# Patient Record
Sex: Male | Born: 1990 | Race: White | Hispanic: No | Smoking: Current every day smoker
Health system: Southern US, Community
[De-identification: ages and names within clinical notes are randomized; demographics above are authoritative.]

## PROBLEM LIST (undated history)

## (undated) ENCOUNTER — Ambulatory Visit: Admission: EM | Payer: MEDICAID

## (undated) DIAGNOSIS — F419 Anxiety disorder, unspecified: Secondary | ICD-10-CM

## (undated) DIAGNOSIS — G629 Polyneuropathy, unspecified: Secondary | ICD-10-CM

## (undated) DIAGNOSIS — F3181 Bipolar II disorder: Secondary | ICD-10-CM

## (undated) DIAGNOSIS — F840 Autistic disorder: Secondary | ICD-10-CM

## (undated) DIAGNOSIS — F909 Attention-deficit hyperactivity disorder, unspecified type: Secondary | ICD-10-CM

## (undated) DIAGNOSIS — F209 Schizophrenia, unspecified: Secondary | ICD-10-CM

## (undated) DIAGNOSIS — G4730 Sleep apnea, unspecified: Secondary | ICD-10-CM

## (undated) DIAGNOSIS — I10 Essential (primary) hypertension: Secondary | ICD-10-CM

## (undated) DIAGNOSIS — R569 Unspecified convulsions: Secondary | ICD-10-CM

## (undated) DIAGNOSIS — F329 Major depressive disorder, single episode, unspecified: Secondary | ICD-10-CM

## (undated) DIAGNOSIS — K219 Gastro-esophageal reflux disease without esophagitis: Secondary | ICD-10-CM

## (undated) DIAGNOSIS — E785 Hyperlipidemia, unspecified: Secondary | ICD-10-CM

## (undated) DIAGNOSIS — F32A Depression, unspecified: Secondary | ICD-10-CM

## (undated) DIAGNOSIS — F1010 Alcohol abuse, uncomplicated: Secondary | ICD-10-CM

## (undated) HISTORY — DX: Attention-deficit hyperactivity disorder, unspecified type: F90.9

## (undated) HISTORY — DX: Depression, unspecified: F32.A

## (undated) HISTORY — DX: Schizophrenia, unspecified: F20.9

## (undated) HISTORY — PX: HAND SURGERY: SHX662

## (undated) HISTORY — DX: Polyneuropathy, unspecified: G62.9

## (undated) HISTORY — PX: WISDOM TOOTH EXTRACTION: SHX21

## (undated) HISTORY — DX: Major depressive disorder, single episode, unspecified: F32.9

## (undated) HISTORY — DX: Essential (primary) hypertension: I10

## (undated) HISTORY — DX: Gastro-esophageal reflux disease without esophagitis: K21.9

## (undated) HISTORY — DX: Sleep apnea, unspecified: G47.30

## (undated) HISTORY — DX: Hyperlipidemia, unspecified: E78.5

## (undated) HISTORY — PX: TYMPANOSTOMY TUBE PLACEMENT: SHX32

## (undated) HISTORY — DX: Autistic disorder: F84.0

## (undated) HISTORY — DX: Anxiety disorder, unspecified: F41.9

---
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
## 2016-04-17 DIAGNOSIS — R6882 Decreased libido: Secondary | ICD-10-CM | POA: Insufficient documentation

## 2018-12-08 ENCOUNTER — Telehealth (HOSPITAL_COMMUNITY): Payer: Self-pay | Admitting: Psychiatry

## 2019-02-09 ENCOUNTER — Encounter (HOSPITAL_COMMUNITY): Payer: Self-pay | Admitting: Psychiatry

## 2019-02-09 ENCOUNTER — Ambulatory Visit (INDEPENDENT_AMBULATORY_CARE_PROVIDER_SITE_OTHER): Payer: 59 | Admitting: Psychiatry

## 2019-02-09 VITALS — BP 120/80 | Ht 69.0 in | Wt 182.0 lb

## 2019-02-09 DIAGNOSIS — F411 Generalized anxiety disorder: Secondary | ICD-10-CM

## 2019-02-09 DIAGNOSIS — F1020 Alcohol dependence, uncomplicated: Secondary | ICD-10-CM

## 2019-02-09 DIAGNOSIS — F331 Major depressive disorder, recurrent, moderate: Secondary | ICD-10-CM | POA: Diagnosis not present

## 2019-02-09 MED ORDER — GABAPENTIN 300 MG PO CAPS: 300.0000 mg | ORAL_CAPSULE | Freq: Three times a day (TID) | ORAL | 0 refills | Status: DC

## 2019-02-09 MED ORDER — BUSPIRONE HCL 15 MG PO TABS: 15.0000 mg | ORAL_TABLET | Freq: Two times a day (BID) | ORAL | 0 refills | Status: DC

## 2019-02-09 MED ORDER — MIRTAZAPINE 15 MG PO TABS: 15.0000 mg | ORAL_TABLET | Freq: Every day | ORAL | 0 refills | Status: DC

## 2019-02-09 NOTE — Patient Instructions (Signed)
Plan:  1. Trial of mirtazapine (Remeron) for depression and anxiety. Start by taking 15 mg tablet at bedtime.  2. We will increase dose of buspirone to 15 mg twice daily.  3. We will try gabapentin for anxiety/tremor (it may help you in limiting or quitting alcohol use). Take one 300 mg tablet in the morning and two tablets at bedtime.
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
## 2019-02-24 ENCOUNTER — Other Ambulatory Visit: Payer: Self-pay

## 2019-02-24 ENCOUNTER — Encounter (HOSPITAL_COMMUNITY): Payer: Self-pay

## 2019-02-24 ENCOUNTER — Emergency Department (HOSPITAL_COMMUNITY): Payer: 59

## 2019-02-24 ENCOUNTER — Emergency Department (HOSPITAL_COMMUNITY)
Admission: EM | Admit: 2019-02-24 | Discharge: 2019-02-24 | Disposition: A | Discharge status: Home / Self Care | Payer: 59 | Attending: Emergency Medicine | Admitting: Emergency Medicine

## 2019-02-24 DIAGNOSIS — Y939 Activity, unspecified: Secondary | ICD-10-CM | POA: Diagnosis not present

## 2019-02-24 DIAGNOSIS — S61011A Laceration without foreign body of right thumb without damage to nail, initial encounter: Secondary | ICD-10-CM | POA: Diagnosis not present

## 2019-02-24 DIAGNOSIS — Z23 Encounter for immunization: Secondary | ICD-10-CM | POA: Diagnosis not present

## 2019-02-24 DIAGNOSIS — Z79899 Other long term (current) drug therapy: Secondary | ICD-10-CM | POA: Insufficient documentation

## 2019-02-24 DIAGNOSIS — W268XXA Contact with other sharp object(s), not elsewhere classified, initial encounter: Secondary | ICD-10-CM | POA: Diagnosis not present

## 2019-02-24 DIAGNOSIS — F17210 Nicotine dependence, cigarettes, uncomplicated: Secondary | ICD-10-CM | POA: Diagnosis not present

## 2019-02-24 DIAGNOSIS — Y929 Unspecified place or not applicable: Secondary | ICD-10-CM | POA: Insufficient documentation

## 2019-02-24 DIAGNOSIS — S6991XA Unspecified injury of right wrist, hand and finger(s), initial encounter: Secondary | ICD-10-CM | POA: Diagnosis present

## 2019-02-24 DIAGNOSIS — Y999 Unspecified external cause status: Secondary | ICD-10-CM | POA: Diagnosis not present

## 2019-02-24 MED ORDER — BUPIVACAINE HCL (PF) 0.5 % IJ SOLN
10.0000 mL | Freq: Once | INTRAMUSCULAR | Status: AC
  Administered 2019-02-24: 10 mL
  Filled 2019-02-24: qty 10

## 2019-02-24 MED ORDER — TETANUS-DIPHTH-ACELL PERTUSSIS 5-2.5-18.5 LF-MCG/0.5 IM SUSP
0.5000 mL | Freq: Once | INTRAMUSCULAR | Status: AC
  Administered 2019-02-24: 0.5 mL via INTRAMUSCULAR
  Filled 2019-02-24: qty 0.5

## 2019-02-24 MED ORDER — BACITRACIN-NEOMYCIN-POLYMYXIN OINTMENT TUBE
TOPICAL_OINTMENT | Freq: Once | CUTANEOUS | Status: AC
  Administered 2019-02-24: 1 via TOPICAL
  Filled 2019-02-24: qty 14

## 2019-02-24 NOTE — ED Notes (Signed)
Patient verbalizes understanding of discharge instructions. Opportunity for questioning and answers were provided. Armband removed by staff, pt discharged from ED ambulatory.   

## 2019-02-24 NOTE — ED Provider Notes (Signed)
[facility] EMERGENCY DEPARTMENT Provider Note   CSN: [id number] Arrival date & time: 02/24/19  2117    History   Chief Complaint Chief Complaint  Patient presents with  . Finger Injury    HPI John Vaughan is a 28 y.o. male.     Pt presents to the ED today with a right thumb laceration.  The pt said he cut it on a muddy fan blade.  He has no idea when he had his last tetanus shot.  Pt denies any another injuries.     Past Medical History:  Diagnosis Date  . Anxiety   . Depression     There are no active problems to display for this patient.   History reviewed. No pertinent surgical history.      Home Medications    Prior to Admission medications   Medication Sig Start Date End Date Taking? Authorizing Provider  busPIRone (BUSPAR) 15 MG tablet Take 1 tablet (15 mg total) by mouth 2 (two) times daily for 30 days. 02/09/19 03/11/19  Pucilowski, Roosvelt Maser, MD  gabapentin (NEURONTIN) 300 MG capsule Take 1 capsule (300 mg total) by mouth 3 (three) times daily for 30 days. 02/09/19 03/11/19  Pucilowski, Roosvelt Maser, MD  mirtazapine (REMERON) 15 MG tablet Take 1 tablet (15 mg total) by mouth at bedtime for 30 days. 02/09/19 03/11/19  Pucilowski, Roosvelt Maser, MD    Family History Family History  Problem Relation Age of Onset  . Anxiety disorder Mother   . Alcohol abuse Father   . Anxiety disorder Maternal Aunt   . Anxiety disorder Maternal Grandmother   . Alcohol abuse Paternal Grandfather     Social History Social History   Tobacco Use  . Smoking status: Current Every Day Smoker    Packs/day: 1.00    Types: Cigarettes  . Smokeless tobacco: Current User    Types: Snuff  Substance Use Topics  . Alcohol use: Yes    Alcohol/week: 18.0 standard drinks    Types: 18 Cans of beer per week    Comment: Patient has interest in stopping  . Drug use: Not Currently     Allergies   Patient has no known allergies.   Review of Systems Review of  Systems  Skin: Positive for wound.  All other systems reviewed and are negative.    Physical Exam Updated Vital Signs BP 112/71   Pulse 93   Temp 98 F (36.7 C) (Oral)   Resp 20   SpO2 97%   Physical Exam Vitals signs and nursing note reviewed.  Constitutional:      Appearance: Normal appearance.  HENT:     Head: Normocephalic and atraumatic.     Right Ear: External ear normal.     Left Ear: External ear normal.     Nose: Nose normal.     Mouth/Throat:     Mouth: Mucous membranes are dry.  Eyes:     Pupils: Pupils are equal, round, and reactive to light.  Neck:     Musculoskeletal: Normal range of motion and neck supple.  Cardiovascular:     Rate and Rhythm: Normal rate and regular rhythm.     Pulses: Normal pulses.     Heart sounds: Normal heart sounds.  Pulmonary:     Effort: Pulmonary effort is normal.     Breath sounds: Normal breath sounds.  Abdominal:     General: Abdomen is flat.     Palpations: Abdomen is soft.  Musculoskeletal: Normal  range of motion.  Skin:    General: Skin is warm.     Capillary Refill: Capillary refill takes less than 2 seconds.     Comments: Lac to right IP joint.    Neurological:     General: No focal deficit present.     Mental Status: He is alert and oriented to person, place, and time.  Psychiatric:        Mood and Affect: Mood normal.        Behavior: Behavior normal.      ED Treatments / Results  Labs (all labs ordered are listed, but only abnormal results are displayed) Labs Reviewed - No data to display  EKG None  Radiology Dg Finger Thumb Right  Result Date: 02/24/2019 CLINICAL DATA:  Thumb injury, laceration EXAM: RIGHT THUMB 2+V COMPARISON:  None. FINDINGS: No fracture or malalignment.  No radiopaque foreign body. IMPRESSION: No acute osseous abnormality Electronically Signed   By: Jasmine Pang M.D.   On: 02/24/2019 22:34    Procedures .Marland KitchenLaceration Repair Date/Time: 02/24/2019 10:54 PM Performed by:  Jacalyn Lefevre, MD Authorized by: Jacalyn Lefevre, MD   Consent:    Consent obtained:  Verbal   Consent given by:  Patient   Risks discussed:  Infection and pain   Alternatives discussed:  No treatment Anesthesia (see MAR for exact dosages):    Anesthesia method:  Nerve block   Block needle gauge:  27 G   Block anesthetic:  Bupivacaine 0.5% w/o epi   Block technique:  Digital   Block injection procedure:  Anatomic landmarks identified, introduced needle, incremental injection and anatomic landmarks palpated   Block outcome:  Anesthesia achieved Laceration details:    Location:  Finger   Finger location:  R thumb   Length (cm):  1 Repair type:    Repair type:  Simple Pre-procedure details:    Preparation:  Patient was prepped and draped in usual sterile fashion Exploration:    Wound exploration: wound explored through full range of motion   Treatment:    Area cleansed with:  Saline   Amount of cleaning:  Extensive   Irrigation solution:  Sterile saline   Irrigation method:  Pressure wash Skin repair:    Repair method:  Sutures   Suture size:  3-0   Suture material:  Prolene   Suture technique:  Simple interrupted   Number of sutures:  2 Approximation:    Approximation:  Close Post-procedure details:    Dressing:  Antibiotic ointment   Patient tolerance of procedure:  Tolerated well, no immediate complications   (including critical care time)  Medications Ordered in ED Medications  Tdap (BOOSTRIX) injection 0.5 mL (has no administration in time range)  neomycin-bacitracin-polymyxin (NEOSPORIN) ointment packet (has no administration in time range)  bupivacaine (MARCAINE) 0.5 % injection 10 mL (10 mLs Infiltration Given by Other 02/24/19 2243)     Initial Impression / Assessment and Plan / ED Course  I have reviewed the triage vital signs and the nursing notes.  Pertinent labs & imaging results that were available during my care of the patient were reviewed by me and  considered in my medical decision making (see chart for details).       Pt was given a tetanus in ED.  He is stable for d/c.  Return for increased redness/swelling.  Final Clinical Impressions(s) / ED Diagnoses   Final diagnoses:  Laceration of right thumb without foreign body without damage to nail, initial encounter    ED  Discharge Orders    None       Jacalyn Lefevre, MD 02/24/19 2307

## 2019-02-24 NOTE — ED Notes (Signed)
Pt will come to room after xray

## 2019-02-24 NOTE — ED Triage Notes (Signed)
Pt states that he was working on his trunk today and cut his R thumb, bleeding controlled, last tetanus unknown.

## 2019-02-24 NOTE — ED Notes (Signed)
ED Provider at bedside. 

## 2019-03-09 ENCOUNTER — Ambulatory Visit (INDEPENDENT_AMBULATORY_CARE_PROVIDER_SITE_OTHER): Payer: 59 | Admitting: Psychiatry

## 2019-03-09 ENCOUNTER — Encounter (HOSPITAL_COMMUNITY): Payer: Self-pay | Admitting: Psychiatry

## 2019-03-09 ENCOUNTER — Other Ambulatory Visit: Payer: Self-pay

## 2019-03-09 VITALS — BP 162/70 | HR 93 | Temp 98.7°F | Ht 72.0 in | Wt 185.0 lb

## 2019-03-09 DIAGNOSIS — F411 Generalized anxiety disorder: Secondary | ICD-10-CM | POA: Diagnosis not present

## 2019-03-09 DIAGNOSIS — F1020 Alcohol dependence, uncomplicated: Secondary | ICD-10-CM | POA: Diagnosis not present

## 2019-03-09 DIAGNOSIS — F330 Major depressive disorder, recurrent, mild: Secondary | ICD-10-CM | POA: Insufficient documentation

## 2019-03-09 MED ORDER — BUSPIRONE HCL 15 MG PO TABS: 15.0000 mg | ORAL_TABLET | Freq: Two times a day (BID) | ORAL | 0 refills | Status: AC

## 2019-03-09 MED ORDER — PROPRANOLOL HCL 10 MG PO TABS: 10.0000 mg | ORAL_TABLET | Freq: Three times a day (TID) | ORAL | 0 refills | Status: DC

## 2019-03-09 MED ORDER — MIRTAZAPINE 30 MG PO TABS: 30.0000 mg | ORAL_TABLET | Freq: Every day | ORAL | 0 refills | Status: DC

## 2019-03-09 NOTE — Progress Notes (Signed)
[facility] MD/PA/NP OP Progress Note  03/09/2019 2:29 PM John Vaughan  MRN:  [id number]  Chief Complaint: Anxious, restless HPI: 28 yo male with alcohol use disorder, major depression and mixed anxiety. He has been tried on two SSRIs (Prozac and Zoloft) but at a low dose and very briefly. He is still visibly tremulous. He did not join AA despite suggestion to do so. He is passively suicidal but he contracts for safety and there is no hx of suicidal attempts. He has been able to decrease amount of alcohol he consumes to 8-10 beers in the evening. He is disappointed that gabapentin did not appear to have helped with restlessness. On the other hand his depression is resolved on Remeron and anxiety is less. His sleep and appetite are good. There is no SI.  Visit Diagnosis:    ICD-10-CM   1. GAD (generalized anxiety disorder) F41.1   2. Alcohol use disorder, severe, dependence (HCC) F10.20   3. Mild episode of recurrent major depressive disorder (HCC) F33.0     Past Psychiatric History: Please see initial H&P.  Past Medical History:  Past Medical History:  Diagnosis Date  . Anxiety   . Depression    History reviewed. No pertinent surgical history.  Family Psychiatric History: reviewed  Family History:  Family History  Problem Relation Age of Onset  . Anxiety disorder Mother   . Alcohol abuse Father   . Anxiety disorder Maternal Aunt   . Anxiety disorder Maternal Grandmother   . Alcohol abuse Paternal Grandfather     Social History:  Social History   Socioeconomic History  . Marital status: Single    Spouse name: Not on file  . Number of children: Not on file  . Years of education: Not on file  . Highest education level: Not on file  Occupational History  . Not on file  Social Needs  . Financial resource strain: Not on file  . Food insecurity:    Worry: Not on file    Inability: Not on file  . Transportation needs:    Medical: Not on file    Non-medical: Not on file   Tobacco Use  . Smoking status: Current Every Day Smoker    Packs/day: 1.00    Types: Cigarettes  . Smokeless tobacco: Former Neurosurgeon    Types: Snuff  Substance and Sexual Activity  . Alcohol use: Yes    Alcohol/week: 18.0 standard drinks    Types: 18 Cans of beer per week    Comment: Patient has interest in stopping  . Drug use: Not Currently  . Sexual activity: Not Currently  Lifestyle  . Physical activity:    Days per week: Not on file    Minutes per session: Not on file  . Stress: Not on file  Relationships  . Social connections:    Talks on phone: Not on file    Gets together: Not on file    Attends religious service: Not on file    Active member of club or organization: Not on file    Attends meetings of clubs or organizations: Not on file    Relationship status: Not on file  Other Topics Concern  . Not on file  Social History Narrative  . Not on file    Allergies: No Known Allergies  Metabolic Disorder Labs: No results found for: HGBA1C, MPG No results found for: PROLACTIN No results found for: CHOL, TRIG, HDL, CHOLHDL, VLDL, LDLCALC No results found for: TSH  Therapeutic Level  Labs: No results found for: LITHIUM No results found for: VALPROATE No components found for:  CBMZ  Current Medications: Current Outpatient Medications  Medication Sig Dispense Refill  . busPIRone (BUSPAR) 15 MG tablet Take 1 tablet (15 mg total) by mouth 2 (two) times daily for 30 days. 60 tablet 0  . mirtazapine (REMERON) 30 MG tablet Take 1 tablet (30 mg total) by mouth at bedtime for 30 days. 30 tablet 0  . propranolol (INDERAL) 10 MG tablet Take 1 tablet (10 mg total) by mouth 3 (three) times daily for 30 days. 90 tablet 0   No current facility-administered medications for this visit.      Musculoskeletal: Strength & Muscle Tone: within normal limits Gait & Station: normal Patient leans: N/A  Psychiatric Specialty Exam: Review of Systems  Constitutional: Negative.    HENT: Negative.   Eyes: Negative.   Respiratory: Negative.   Cardiovascular: Negative.   Gastrointestinal: Negative.   Genitourinary: Negative.   Musculoskeletal: Negative.   Skin: Negative.   Neurological: Negative.   Endo/Heme/Allergies: Negative.   Psychiatric/Behavioral: The patient is nervous/anxious.     Blood pressure (!) 162/70, pulse 93, temperature 98.7 F (37.1 C), height 6' (1.829 m), weight 185 lb (83.9 kg).Body mass index is 25.09 kg/m.  General Appearance: Casual  Eye Contact:  Good  Speech:  Clear and Coherent  Volume:  Normal  Mood:  Anxious  Affect:  Full Range  Thought Process:  Goal Directed  Orientation:  Full (Time, Place, and Person)  Thought Content: Logical   Suicidal Thoughts:  No  Homicidal Thoughts:  No  Memory:  Immediate;   Good Recent;   Good Remote;   Good  Judgement:  Fair  Insight:  Fair  Psychomotor Activity:  Restlessness  Concentration:  Concentration: Good  Recall:  Good  Fund of Knowledge: Good  Language: Good  Akathisia:  unchenged from past visit.  Handed:  Right  AIMS (if indicated): not done  Assets:  Communication Skills Desire for Improvement Housing Physical Health  ADL's:  Intact  Cognition: WNL  Sleep:  Good   Screenings: AUDIT     Office Visit from 02/09/2019 in [facility]  Alcohol Use Disorder Identification Test Final Score (AUDIT)  29    GAD-7     Office Visit from 02/09/2019 in [facility]  Total GAD-7 Score  20    PHQ2-9     Office Visit from 02/09/2019 in [facility]  PHQ-2 Total Score  5  PHQ-9 Total Score  20       Assessment and Plan: 28 yo male with alcohol use disorder, major depression and mixed anxiety. He has been tried on two SSRIs (Prozac and Zoloft) but at a low dose and very briefly. He is still visibly tremulous. He did not join AA despite suggestion to do so. He is  passively suicidal but he contracts for safety and there is no hx of suicidal attempts. He has been able to decrease amount of alcohol he consumes to 8-10 beers in the evening. He is disappointed that gabapentin did not appear to have helped with restlessness. On the other hand his depression is resolved on Remeron and anxiety is less. His sleep and appetite are good. There is no SI.  Plan: Taper gabapentin off. Increase Remeron to 30 mg at HS, continue Buspar 15 mg bid. Add propranolol 10 mg tid for restlessness/anxiety. Return to clinic in 4 weeks.  Magdalene Patricia, MD 03/09/2019, 2:29 PM

## 2019-03-26 ENCOUNTER — Emergency Department (HOSPITAL_COMMUNITY)
Admission: EM | Admit: 2019-03-26 | Discharge: 2019-03-26 | Disposition: A | Discharge status: Home / Self Care | Payer: 59 | Attending: Emergency Medicine | Admitting: Emergency Medicine

## 2019-03-26 ENCOUNTER — Encounter (HOSPITAL_COMMUNITY): Payer: Self-pay | Admitting: Emergency Medicine

## 2019-03-26 ENCOUNTER — Other Ambulatory Visit: Payer: Self-pay

## 2019-03-26 DIAGNOSIS — F17210 Nicotine dependence, cigarettes, uncomplicated: Secondary | ICD-10-CM | POA: Diagnosis not present

## 2019-03-26 DIAGNOSIS — Z79899 Other long term (current) drug therapy: Secondary | ICD-10-CM | POA: Insufficient documentation

## 2019-03-26 DIAGNOSIS — F1010 Alcohol abuse, uncomplicated: Secondary | ICD-10-CM

## 2019-03-26 DIAGNOSIS — F10239 Alcohol dependence with withdrawal, unspecified: Secondary | ICD-10-CM | POA: Diagnosis present

## 2019-03-26 MED ORDER — CHLORDIAZEPOXIDE HCL 25 MG PO CAPS: ORAL_CAPSULE | ORAL | 0 refills | Status: DC

## 2019-03-26 NOTE — ED Provider Notes (Signed)
[facility]-EMERGENCY DEPT Provider Note   CSN: [id number] Arrival date & time: 03/26/19  1816    History   Chief Complaint Chief Complaint  Patient presents with  . Withdrawal    HPI John Vaughan is a 28 y.o. male.     HPI   Patient is a 28 year old male with a history of anxiety and depression, who presents to the emergency department today with request for alcohol detox.  Patient states that he has drank heavily for the past 11 years.  He currently drinks about 18 peers per day.  He denies any suicidal or homicidal ideations.  States that he has had hallucinations in the past but not for very long time.  Has also had some auditory hallucinations when he has heard things that others have not heard in the room.  Denies hearing voices or having command hallucinations.  He has told his physician about these hallucinations and was told that his medication would improve them, which she states is true.  He does admit that he stopped taking his depression medication about 3 weeks ago because he states he would rather drink and take this medication.  Denies any significant cough, congestion, rhinorrhea, sore throat, chest pain, shortness of breath, abdominal pain.  He does not endorse frequent vomiting from his alcohol use.  Does endorse chronic diarrhea from his beer drinking.  Denies fevers.  Past Medical History:  Diagnosis Date  . Anxiety   . Depression     Patient Active Problem List   Diagnosis Date Noted  . GAD (generalized anxiety disorder) 03/09/2019  . Alcohol use disorder, severe, dependence (HCC) 03/09/2019  . 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 No pertinent surgical history.      Home Medications    Prior to Admission medications   Medication Sig Start Date End Date Taking? Authorizing Provider  busPIRone (BUSPAR) 15 MG tablet Take 1 tablet (15 mg total) by mouth 2 (two) times daily  for 30 days. 03/09/19 04/08/19  Pucilowski, Roosvelt Maser, MD  chlordiazePOXIDE (LIBRIUM) 25 MG capsule 50mg  PO TID x 1D, then 25-50mg  PO BID X 1D, then 25-50mg  PO QD X 1D 03/26/19   Arriana Lohmann S, PA-C  mirtazapine (REMERON) 30 MG tablet Take 1 tablet (30 mg total) by mouth at bedtime for 30 days. 03/09/19 04/08/19  Pucilowski, Roosvelt Maser, MD  propranolol (INDERAL) 10 MG tablet Take 1 tablet (10 mg total) by mouth 3 (three) times daily for 30 days. 03/09/19 04/08/19  Pucilowski, Roosvelt Maser, MD    Family History Family History  Problem Relation Age of Onset  . Anxiety disorder Mother   . Alcohol abuse Father   . Anxiety disorder Maternal Aunt   . Anxiety disorder Maternal Grandmother   . Alcohol abuse Paternal Grandfather     Social History Social History   Tobacco Use  . Smoking status: Current Every Day Smoker    Packs/day: 1.00    Types: Cigarettes  . Smokeless tobacco: Former Neurosurgeon    Types: Snuff  Substance Use Topics  . Alcohol use: Yes    Alcohol/week: 18.0 standard drinks    Types: 18 Cans of beer per week    Comment: Patient has interest in stopping  . Drug use: Not Currently     Allergies   Patient has no known allergies.   Review of Systems Review of Systems  Constitutional: Negative for fever.  HENT: Negative for ear pain and  sore throat.   Eyes: Negative for visual disturbance.  Respiratory: Negative for cough and shortness of breath.   Cardiovascular: Negative for chest pain.  Gastrointestinal: Positive for diarrhea, nausea and vomiting. Negative for abdominal pain and constipation.  Genitourinary: Negative for dysuria and hematuria.  Musculoskeletal: Negative for back pain.  Skin: Negative for rash.  Neurological: Negative for headaches.  Psychiatric/Behavioral:       Etoh abuse, no current si, hi, or avh  All other systems reviewed and are negative.    Physical Exam Updated Vital Signs BP (!) 142/100 (BP Location: Left Arm)   Pulse (!) 103   Temp 99.1  F (37.3 C) (Oral)   Resp 16   SpO2 99%   Physical Exam Vitals signs and nursing note reviewed.  Constitutional:      General: He is not in acute distress.    Appearance: He is well-developed. He is not ill-appearing, toxic-appearing or diaphoretic.  HENT:     Head: Normocephalic and atraumatic.  Eyes:     Conjunctiva/sclera: Conjunctivae normal.  Neck:     Musculoskeletal: Neck supple.  Cardiovascular:     Rate and Rhythm: Regular rhythm. Tachycardia present.     Heart sounds: Normal heart sounds. No murmur.     Comments: Marginally tachycardic Pulmonary:     Effort: Pulmonary effort is normal. No respiratory distress.     Breath sounds: Normal breath sounds. No wheezing, rhonchi or rales.  Abdominal:     General: Bowel sounds are normal.     Palpations: Abdomen is soft.     Tenderness: There is no abdominal tenderness.  Skin:    General: Skin is warm and dry.  Neurological:     Mental Status: He is alert.  Psychiatric:        Behavior: Behavior normal.        Thought Content: Thought content normal.        Judgment: Judgment normal.     Comments: Appears anxious, tapping feet quickly throughout exam      ED Treatments / Results  Labs (all labs ordered are listed, but only abnormal results are displayed) Labs Reviewed - No data to display  EKG None  Radiology No results found.  Procedures Procedures (including critical care time)  Medications Ordered in ED Medications - No data to display   Initial Impression / Assessment and Plan / ED Course  I have reviewed the triage vital signs and the nursing notes.  Pertinent labs & imaging results that were available during my care of the patient were reviewed by me and considered in my medical decision making (see chart for details).     Final Clinical Impressions(s) / ED Diagnoses   Final diagnoses:  ETOH abuse   Pt presenting requesting ETOH detox. He last drank etoh this am. No illicit substance use.   No SI or HI.  No significant concerns for auditory or visual hallucinations.  He has no medical complaints.  He denies a history of complicated withdrawal symptoms.  States he has never stopped drinking alcohol. No significant current withdrawal sxs. last drink earlier today. CIWA score is 4. Pt not in acute withdrawal.   He recently stopped taking his depression medications.  He does follow with the physician who monitors him for his depression/anxiety.  I consulted peer support.  I will give him Librium taper.  Advised not to drink alcohol while taking this medication.  I advised him to contact his physician tomorrow morning to inform him that  he is not taking his depression medications and that he has started Librium and would like to stop drinking alcohol.  Gave him resources for detox programs.  Advised on specific return precautions.  He voices understanding of the plan and reasons to return.  All questions answered.  Patient stable discharge.   ED Discharge Orders         Ordered    chlordiazePOXIDE (LIBRIUM) 25 MG capsule     03/26/19 1906           Rayne Du 03/26/19 Geryl Councilman, MD 03/30/19 [phone]

## 2019-03-26 NOTE — Discharge Instructions (Signed)
Please take the medication as prescribed.  Do not drink alcohol while you are taking this medication.  A peer support contact will reach out to you soon.   Please follow-up with the resources that were prior to provided for you in the emergency department today.  Please call your doctor tomorrow to inform them that you have stopped taking your depression medications.  Also inform them that you started Librium and would like to stop drinking alcohol.  Return to the emergency department for any new or worsening symptoms including suicidal thoughts, thoughts of wanting to hurting others, seizures.

## 2019-03-26 NOTE — ED Notes (Signed)
Bed: [facility]3 Expected date:  Expected time:  Means of arrival:  Comments: 

## 2019-03-26 NOTE — ED Triage Notes (Signed)
Patient presents requesting alcohol detox. States he drinks approximately eighteen beers a day. Reports last drink x25 minutes ago. Denies N/V/D.

## 2019-04-09 ENCOUNTER — Other Ambulatory Visit: Payer: Self-pay

## 2019-04-09 ENCOUNTER — Ambulatory Visit (INDEPENDENT_AMBULATORY_CARE_PROVIDER_SITE_OTHER): Payer: 59 | Admitting: Psychiatry

## 2019-04-09 DIAGNOSIS — F330 Major depressive disorder, recurrent, mild: Secondary | ICD-10-CM | POA: Diagnosis not present

## 2019-04-09 DIAGNOSIS — F411 Generalized anxiety disorder: Secondary | ICD-10-CM

## 2019-04-09 DIAGNOSIS — Z79899 Other long term (current) drug therapy: Secondary | ICD-10-CM | POA: Diagnosis not present

## 2019-04-09 DIAGNOSIS — F1020 Alcohol dependence, uncomplicated: Secondary | ICD-10-CM

## 2019-04-09 MED ORDER — GABAPENTIN 600 MG PO TABS: 600.0000 mg | ORAL_TABLET | Freq: Three times a day (TID) | ORAL | 2 refills | Status: DC

## 2019-04-09 NOTE — Progress Notes (Signed)
BH MD/PA/NP OP Progress Note  04/09/2019 2:22 PM John Vaughan  MRN:  [id number] Interview was conducted using teleconferencing and I verified that I was speaking with the correct person using two identifiers. I discussed the limitations of evaluation and management by telemedicine and  the availability of in person appointments. Patient expressed understanding and agreed to proceed.  Chief Complaint: Tremor, anxiety.  HPI: 28 yo male with alcohol use disorder, major depression and mixed anxiety. He has been tried on two SSRIs (Prozac and Zoloft) but at a low dose and very briefly. He did not join AA despite suggestion to do so. He admitted to past passive suicidal thoughts and there is no hx of suicidal attempts. He was prescribed Remeron for depression with dose titrated to 30 mg but he stopped taking it and insted increased amount of alcohol consumed up to 18 beers daily. He continued to be visibly treomolous during both his office visits. In early April he started to experience nausea, vomiting and auditory hallucinations. He was seen in ED given Librium taper for alcohol withdrawal symptoms but eventually ended up in [facility] with sx of alcohol withdrawal delirium. He spend a week there and was discharged on gabapentin 300 mg qid which decreased his tremor somewhat. He is also supposed to pick another prescription for anxiety but does not remember the name of it and I have no information/access to record from his recent admission. He denies being very depressed now, denies being suicidal but still has anxiety. He has remained sober since discharge from hospital. In the past he failed trials of buspirone and propranolol for anxiety.  Visit Diagnosis:    ICD-10-CM   1. GAD (generalized anxiety disorder) F41.1   2. Alcohol use disorder, severe, dependence (HCC) F10.20   3. Mild episode of recurrent major depressive disorder (HCC) F33.0     Past Psychiatric History:  Please refer to intake H&P.  Past Medical History:  Past Medical History:  Diagnosis Date  . Anxiety   . Depression    No past surgical history on file.  Family Psychiatric History: Reviewed.  Family History:  Family History  Problem Relation Age of Onset  . Anxiety disorder Mother   . Alcohol abuse Father   . Anxiety disorder Maternal Aunt   . Anxiety disorder Maternal Grandmother   . Alcohol abuse Paternal Grandfather     Social History:  Social History   Socioeconomic History  . Marital status: Single    Spouse name: Not on file  . Number of children: Not on file  . Years of education: Not on file  . Highest education level: Not on file  Occupational History  . Not on file  Social Needs  . Financial resource strain: Not on file  . Food insecurity:    Worry: Not on file    Inability: Not on file  . Transportation needs:    Medical: Not on file    Non-medical: Not on file  Tobacco Use  . Smoking status: Current Every Day Smoker    Packs/day: 1.00    Types: Cigarettes  . Smokeless tobacco: Former Neurosurgeon    Types: Snuff  Substance and Sexual Activity  . Alcohol use: Yes    Alcohol/week: 18.0 standard drinks    Types: 18 Cans of beer per week    Comment: Patient has interest in stopping  . Drug use: Not Currently  . Sexual activity: Not Currently  Lifestyle  . Physical activity:  Days per week: Not on file    Minutes per session: Not on file  . Stress: Not on file  Relationships  . Social connections:    Talks on phone: Not on file    Gets together: Not on file    Attends religious service: Not on file    Active member of club or organization: Not on file    Attends meetings of clubs or organizations: Not on file    Relationship status: Not on file  Other Topics Concern  . Not on file  Social History Narrative  . Not on file    Allergies: No Known Allergies  Metabolic Disorder Labs: No results found for: HGBA1C, MPG No results found for:  PROLACTIN No results found for: CHOL, TRIG, HDL, CHOLHDL, VLDL, LDLCALC No results found for: TSH  Therapeutic Level Labs: No results found for: LITHIUM No results found for: VALPROATE No components found for:  CBMZ  Current Medications: Current Outpatient Medications  Medication Sig Dispense Refill  . chlordiazePOXIDE (LIBRIUM) 25 MG capsule  PO TID x 1D, then 25-50mg  PO BID X 1D, then 25-50mg  PO QD X 1D 10 capsule 0  . mirtazapine (REMERON) 30 MG tablet Take 1 tablet (30 mg total) by mouth at bedtime for 30 days. 30 tablet 0  . propranolol (INDERAL) 10 MG tablet Take 1 tablet (10 mg total) by mouth 3 (three) times daily for 30 days. 90 tablet 0   No current facility-administered medications for this visit.      Psychiatric Specialty Exam: Review of Systems  Neurological: Positive for tremors.  Psychiatric/Behavioral: The patient is nervous/anxious.   All other systems reviewed and are negative.   There were no vitals taken for this visit.There is no height or weight on file to calculate BMI.  General Appearance: NA  Eye Contact:  NA  Speech:  Clear and Coherent  Volume:  Normal  Mood:  Anxious  Affect:  NA  Thought Process:  Goal Directed  Orientation:  Full (Time, Place, and Person)  Thought Content: Logical   Suicidal Thoughts:  No  Homicidal Thoughts:  No  Memory:  Immediate;   Good Recent;   Good Remote;   Good  Judgement:  Fair  Insight:  Fair  Psychomotor Activity:  NA  Concentration:  Concentration: Good  Recall:  Good  Fund of Knowledge: Fair  Language: Good  Akathisia:  NA  Handed:  Right  AIMS (if indicated): not done  Assets:  Communication Skills Desire for Improvement Financial Resources/Insurance Housing Resilience  ADL's:  Intact  Cognition: WNL  Sleep:  Fair   Screenings: AUDIT     [facility] 02/09/2019 in [facility]  Alcohol Use Disorder Identification Test Final Score (AUDIT)  29     GAD-7     Office Visit from 02/09/2019 in [facility]  Total GAD-7 Score  20    PHQ2-9     Office Visit from 02/09/2019 in [facility]  PHQ-2 Total Score  5  PHQ-9 Total Score  20       Assessment and Plan: 28 yo male with alcohol use disorder, major depression and mixed anxiety. He has been tried on two SSRIs (Prozac and Zoloft) but at a low dose and very briefly. He did not join AA despite suggestion to do so. He admitted to past passive suicidal thoughts and there is no hx of suicidal attempts. He was prescribed Remeron for depression with  dose titrated to 30 mg but he stopped taking it and insted increased amount of alcohol consumed up to 18 beers daily. He earlier tried Prozac and Zoloft low doses with no clear benefit. He continued to be visibly treomolous during both his office visits. In early April he started to experience nausea, vomiting and auditory hallucinations. He was seen in ED given Librium taper for alcohol withdrawal symptoms but eventually ended [facility] with sx of alcohol withdrawal delirium. He spend a week there and was discharged on gabapentin 300 mg qid which decreased his tremor somewhat. He is also supposed to pick another prescription for anxiety but does not remember the name of it and I have no information/access to record from his recent admission. He denies being very depressed now, denies being suicidal but still has anxiety. He has remained sober since discharge from hospital. In the past he failed trials of buspirone and propranolol for anxiety.  Plan: Continue gabapentin but increase dose to 600 mg tid to further help with anxiety and tremor. Patient will pick up prescription for a medicine he found helpful while at [facility] but does not recall the name of. No additional medications at this tim. We will review his progress in 3 weeks.    Magdalene Patricialgierd A  Wilson Dusenbery, MD 04/09/2019, 2:22 PM

## 2019-05-04 ENCOUNTER — Other Ambulatory Visit: Payer: Self-pay

## 2019-05-04 ENCOUNTER — Ambulatory Visit (INDEPENDENT_AMBULATORY_CARE_PROVIDER_SITE_OTHER): Payer: 59 | Admitting: Psychiatry

## 2019-05-04 DIAGNOSIS — F330 Major depressive disorder, recurrent, mild: Secondary | ICD-10-CM | POA: Diagnosis not present

## 2019-05-04 DIAGNOSIS — F411 Generalized anxiety disorder: Secondary | ICD-10-CM

## 2019-05-04 DIAGNOSIS — F1021 Alcohol dependence, in remission: Secondary | ICD-10-CM | POA: Diagnosis not present

## 2019-05-04 MED ORDER — NALTREXONE HCL 50 MG PO TABS: 50.0000 mg | ORAL_TABLET | Freq: Every day | ORAL | 1 refills | Status: AC

## 2019-05-04 MED ORDER — SERTRALINE HCL 50 MG PO TABS: 50.0000 mg | ORAL_TABLET | Freq: Every day | ORAL | 1 refills | Status: DC

## 2019-05-04 NOTE — Progress Notes (Signed)
BH MD/PA/NP OP Progress Note  05/04/2019 3:42 PM John Vaughan  MRN:  [id number] Interview was conducted by phone and I verified that I was speaking with the correct person using two identifiers. I discussed the limitations of evaluation and management by telemedicine and  the availability of in person appointments. Patient expressed understanding and agreed to proceed.  Chief Complaint: "I am doing well, not drinking".   HPI: 28 yo male with alcohol use disorder, major depression and mixed anxiety. He has been tried on two SSRIs (Prozac and Zoloft) but at a low dose and very briefly. He did not join AA despite suggestion to do so. He admitted to past passive suicidal thoughts and there is no hx of suicidal attempts.He was prescribed Remeron for depression with dose titrated to 30 mg but he stopped taking it and insted increased amount of alcohol consumed up to 18 beers daily. He earlier tried Prozac and Zoloft low doses with no clear benefit. He continued to be visibly treomolous during both his office visits. In early April he started to experience nausea, vomiting and auditory hallucinations. He was seen in ED given Librium taper for alcohol withdrawal symptoms but eventually ended up in [facility] with sx of alcohol withdrawal delirium. He spend a week there and was discharged on gabapentin 300 mg qid which decreased his tremor somewhat. He is also supposed to pick another prescription for anxiety but does not remember the name of it and I have no information/access to record from his recent admission. He denies being very depressed now, denies being suicidal but still has anxiety. He has remained sober since discharge from hospital (6 weeks now - since 03/26/19). In the past he failed trials of buspirone and propranolol for anxiety. We increased dose to 600 mg tid to further help with anxiety and tremor. He was also started on sertraline 50 mg and naltrexone at [facility]. He reports feeling less anxious, depressed and his tremor has subsided. Sleep and appetite are good.   Visit Diagnosis:    ICD-10-CM   1. GAD (generalized anxiety disorder) F41.1   2. Alcohol use disorder, severe, in early remission (HCC) F10.21   3. Mild episode of recurrent major depressive disorder (HCC) F33.0     Past Psychiatric History: Please refer to intake H&P.  Past Medical History:  Past Medical History:  Diagnosis Date  . Anxiety   . Depression    No past surgical history on file.  Family Psychiatric History: Reviewed.  Family History:  Family History  Problem Relation Age of Onset  . Anxiety disorder Mother   . Alcohol abuse Father   . Anxiety disorder Maternal Aunt   . Anxiety disorder Maternal Grandmother   . Alcohol abuse Paternal Grandfather     Social History:  Social History   Socioeconomic History  . Marital status: Single    Spouse name: Not on file  . Number of children: Not on file  . Years of education: Not on file  . Highest education level: Not on file  Occupational History  . Not on file  Social Needs  . Financial resource strain: Not on file  . Food insecurity:    Worry: Not on file    Inability: Not on file  . Transportation needs:    Medical: Not on file    Non-medical: Not on file  Tobacco Use  . Smoking status: Current Every Day Smoker    Packs/day: 1.00  Types: Cigarettes  . Smokeless tobacco: Former NeurosurgeonUser    Types: Snuff  Substance and Sexual Activity  . Alcohol use: Yes    Alcohol/week: 18.0 standard drinks    Types: 18 Cans of beer per week    Comment: Patient has interest in stopping  . Drug use: Not Currently  . Sexual activity: Not Currently  Lifestyle  . Physical activity:    Days per week: Not on file    Minutes per session: Not on file  . Stress: Not on file  Relationships  . Social connections:    Talks on phone: Not on file    Gets together: Not on file    Attends religious service: Not on  file    Active member of club or organization: Not on file    Attends meetings of clubs or organizations: Not on file    Relationship status: Not on file  Other Topics Concern  . Not on file  Social History Narrative  . Not on file    Allergies: No Known Allergies  Metabolic Disorder Labs: No results found for: HGBA1C, MPG No results found for: PROLACTIN No results found for: CHOL, TRIG, HDL, CHOLHDL, VLDL, LDLCALC No results found for: TSH  Therapeutic Level Labs: No results found for: LITHIUM No results found for: VALPROATE No components found for:  CBMZ  Current Medications: Current Outpatient Medications  Medication Sig Dispense Refill  . naltrexone (DEPADE) 50 MG tablet Take 1 tablet (50 mg total) by mouth daily. 30 tablet 1  . gabapentin (NEURONTIN) 600 MG tablet Take 1 tablet (600 mg total) by mouth 3 (three) times daily. 90 tablet 2  . sertraline (ZOLOFT) 50 MG tablet Take 1 tablet (50 mg total) by mouth daily. 30 tablet 1   No current facility-administered medications for this visit.     Psychiatric Specialty Exam: Review of Systems  Neurological: Positive for tremors.  Psychiatric/Behavioral: The patient is nervous/anxious.   All other systems reviewed and are negative.   There were no vitals taken for this visit.There is no height or weight on file to calculate BMI.  General Appearance: NA  Eye Contact:  NA  Speech:  Clear and Coherent  Volume:  Normal  Mood:  Euthymic  Affect:  NA  Thought Process:  Goal Directed  Orientation:  Full (Time, Place, and Person)  Thought Content: Logical   Suicidal Thoughts:  No  Homicidal Thoughts:  No  Memory:  Immediate;   Good Recent;   Good Remote;   Good  Judgement:  Good  Insight:  Good  Psychomotor Activity:  NA  Concentration:  Concentration: Good  Recall:  Good  Fund of Knowledge: Good  Language: Good  Akathisia:  NA  Handed:  Right  AIMS (if indicated): not done  Assets:  Communication Skills Desire  for Improvement Financial Resources/Insurance Housing Physical Health  ADL's:  Intact  Cognition: WNL  Sleep:  Good   Screenings: AUDIT     Office Visit from 02/09/2019 in [facility]  Alcohol Use Disorder Identification Test Final Score (AUDIT)  29    GAD-7     [facility]  Total GAD-7 Score  20    PHQ2-9     Office Visit from 02/09/2019 in [facility]  PHQ-2 Total Score  5  PHQ-9 Total Score  20       Assessment and Plan: 28 yo male with alcohol use disorder,  major depression and mixed anxiety. He has been tried on two SSRIs (Prozac and Zoloft) but at a low dose and very briefly. He did not join AA despite suggestion to do so. He admitted to past passive suicidal thoughts and there is no hx of suicidal attempts.He was prescribed Remeron for depression with dose titrated to 30 mg but he stopped taking it and insted increased amount of alcohol consumed up to 18 beers daily. He earlier tried Prozac and Zoloft low doses with no clear benefit. He continued to be visibly treomolous during both his office visits. In early April he started to experience nausea, vomiting and auditory hallucinations. He was seen in ED given Librium taper for alcohol withdrawal symptoms but eventually ended up in [facility] with sx of alcohol withdrawal delirium. He spend a week there and was discharged on gabapentin 300 mg qid which decreased his tremor somewhat. He is also supposed to pick another prescription for anxiety but does not remember the name of it and I have no information/access to record from his recent admission. He denies being very depressed now, denies being suicidal but still has anxiety. He has remained sober since discharge from hospital (6 weeks now - since 03/26/19). In the past he failed trials of buspirone and propranolol for  anxiety. We increased dose to 600 mg tid to further help with anxiety and tremor. He was also started on sertraline 50 mg and naltrexone at [facility]. He reports feeling less anxious, depressed and his tremor has subsided. Sleep and appetite are good.  Plan: Continue Zoloft, gabapentin, naltrexone unchanged. Next visit in two months or prn.    Magdalene Patricia, MD 05/04/2019, 3:42 PM

## 2019-07-07 ENCOUNTER — Ambulatory Visit (HOSPITAL_COMMUNITY): Payer: 59 | Admitting: Psychiatry

## 2019-07-09 DIAGNOSIS — F10239 Alcohol dependence with withdrawal, unspecified: Secondary | ICD-10-CM | POA: Insufficient documentation

## 2019-11-17 DIAGNOSIS — Z720 Tobacco use: Secondary | ICD-10-CM | POA: Insufficient documentation

## 2019-11-17 DIAGNOSIS — F1320 Sedative, hypnotic or anxiolytic dependence, uncomplicated: Secondary | ICD-10-CM | POA: Insufficient documentation

## 2019-11-17 DIAGNOSIS — R748 Abnormal levels of other serum enzymes: Secondary | ICD-10-CM | POA: Insufficient documentation

## 2019-11-17 DIAGNOSIS — F19930 Other psychoactive substance use, unspecified with withdrawal, uncomplicated: Secondary | ICD-10-CM | POA: Insufficient documentation

## 2020-04-09 DIAGNOSIS — D696 Thrombocytopenia, unspecified: Secondary | ICD-10-CM | POA: Insufficient documentation

## 2020-04-19 ENCOUNTER — Other Ambulatory Visit: Payer: Self-pay

## 2020-04-19 ENCOUNTER — Emergency Department (HOSPITAL_COMMUNITY)
Admission: EM | Admit: 2020-04-19 | Discharge: 2020-04-20 | Disposition: A | Discharge status: Home / Self Care | Payer: 59 | Attending: Emergency Medicine | Admitting: Emergency Medicine

## 2020-04-19 ENCOUNTER — Encounter (HOSPITAL_COMMUNITY): Payer: Self-pay | Admitting: Behavioral Health

## 2020-04-19 DIAGNOSIS — F1094 Alcohol use, unspecified with alcohol-induced mood disorder: Secondary | ICD-10-CM

## 2020-04-19 DIAGNOSIS — R45850 Homicidal ideations: Secondary | ICD-10-CM | POA: Insufficient documentation

## 2020-04-19 DIAGNOSIS — F17210 Nicotine dependence, cigarettes, uncomplicated: Secondary | ICD-10-CM | POA: Insufficient documentation

## 2020-04-19 DIAGNOSIS — Y908 Blood alcohol level of 240 mg/100 ml or more: Secondary | ICD-10-CM | POA: Insufficient documentation

## 2020-04-19 DIAGNOSIS — F1010 Alcohol abuse, uncomplicated: Secondary | ICD-10-CM

## 2020-04-19 DIAGNOSIS — F333 Major depressive disorder, recurrent, severe with psychotic symptoms: Secondary | ICD-10-CM | POA: Insufficient documentation

## 2020-04-19 DIAGNOSIS — F411 Generalized anxiety disorder: Secondary | ICD-10-CM | POA: Diagnosis present

## 2020-04-19 DIAGNOSIS — Z20822 Contact with and (suspected) exposure to covid-19: Secondary | ICD-10-CM | POA: Insufficient documentation

## 2020-04-19 DIAGNOSIS — F10929 Alcohol use, unspecified with intoxication, unspecified: Secondary | ICD-10-CM

## 2020-04-19 DIAGNOSIS — F1020 Alcohol dependence, uncomplicated: Secondary | ICD-10-CM | POA: Diagnosis present

## 2020-04-19 DIAGNOSIS — Z79899 Other long term (current) drug therapy: Secondary | ICD-10-CM | POA: Insufficient documentation

## 2020-04-19 DIAGNOSIS — R45851 Suicidal ideations: Secondary | ICD-10-CM | POA: Insufficient documentation

## 2020-04-19 LAB — COMPREHENSIVE METABOLIC PANEL
ALT: 101 U/L — ABNORMAL HIGH (ref 0–44)
AST: 106 U/L — ABNORMAL HIGH (ref 15–41)
Albumin: 4.8 g/dL (ref 3.5–5.0)
Alkaline Phosphatase: 48 U/L (ref 38–126)
Anion gap: 14 (ref 5–15)
BUN: 6 mg/dL (ref 6–20)
CO2: 24 mmol/L (ref 22–32)
Calcium: 9.4 mg/dL (ref 8.9–10.3)
Chloride: 101 mmol/L (ref 98–111)
Creatinine, Ser: 0.71 mg/dL (ref 0.61–1.24)
GFR calc Af Amer: >60 mL/min (ref >60)
GFR calc non Af Amer: >60 mL/min (ref >60)
Glucose, Bld: 113 mg/dL — ABNORMAL HIGH (ref 70–99)
Potassium: 4 mmol/L (ref 3.5–5.1)
Sodium: 139 mmol/L (ref 135–145)
Total Bilirubin: 0.6 mg/dL (ref 0.3–1.2)
Total Protein: 8.9 g/dL — ABNORMAL HIGH (ref 6.5–8.1)

## 2020-04-19 LAB — RESPIRATORY PANEL BY RT PCR (FLU A&B, COVID)
Influenza A by PCR: NEGATIVE
Influenza B by PCR: NEGATIVE
SARS Coronavirus 2 by RT PCR: NEGATIVE

## 2020-04-19 LAB — CBC
HCT: 42.5 % (ref 39.0–52.0)
Hemoglobin: 14.2 g/dL (ref 13.0–17.0)
MCH: 34.9 pg — ABNORMAL HIGH (ref 26.0–34.0)
MCHC: 33.4 g/dL (ref 30.0–36.0)
MCV: 104.4 fL — ABNORMAL HIGH (ref 80.0–100.0)
Platelets: 425 10*3/uL — ABNORMAL HIGH (ref 150–400)
RBC: 4.07 MIL/uL — ABNORMAL LOW (ref 4.22–5.81)
RDW: 12.2 % (ref 11.5–15.5)
WBC: 4.3 10*3/uL (ref 4.0–10.5)
nRBC: 0 % (ref 0.0–0.2)

## 2020-04-19 LAB — ACETAMINOPHEN LEVEL: Acetaminophen (Tylenol), Serum: <10 ug/mL — ABNORMAL LOW (ref 10–30)

## 2020-04-19 LAB — RAPID URINE DRUG SCREEN, HOSP PERFORMED
Amphetamines: NOT DETECTED
Barbiturates: NOT DETECTED
Benzodiazepines: POSITIVE — AB
Cocaine: NOT DETECTED
Opiates: NOT DETECTED
Tetrahydrocannabinol: NOT DETECTED

## 2020-04-19 LAB — SALICYLATE LEVEL: Salicylate Lvl: <7 mg/dL — ABNORMAL LOW (ref 7.0–30.0)

## 2020-04-19 LAB — ETHANOL: Alcohol, Ethyl (B): 306 mg/dL (ref <10)

## 2020-04-19 MED ORDER — LORAZEPAM 2 MG/ML IJ SOLN: 0.0000 mg | Freq: Four times a day (QID) | INTRAMUSCULAR | Status: DC

## 2020-04-19 MED ORDER — LORAZEPAM 1 MG PO TABS
0.0000 mg | ORAL_TABLET | Freq: Four times a day (QID) | ORAL | Status: DC
  Administered 2020-04-19 (×2): 2 mg via ORAL
  Administered 2020-04-20: 1 mg via ORAL
  Filled 2020-04-19: qty 2
  Filled 2020-04-19: qty 1

## 2020-04-19 MED ORDER — THIAMINE HCL 100 MG/ML IJ SOLN: 100.0000 mg | Freq: Every day | INTRAMUSCULAR | Status: DC

## 2020-04-19 MED ORDER — THIAMINE HCL 100 MG PO TABS
100.0000 mg | ORAL_TABLET | Freq: Every day | ORAL | Status: DC
  Administered 2020-04-19 – 2020-04-20 (×2): 100 mg via ORAL
  Filled 2020-04-19 (×2): qty 1

## 2020-04-19 MED ORDER — NICOTINE 21 MG/24HR TD PT24
21.0000 mg | MEDICATED_PATCH | Freq: Every day | TRANSDERMAL | Status: DC
  Administered 2020-04-20: 21 mg via TRANSDERMAL
  Filled 2020-04-19 (×2): qty 1

## 2020-04-19 MED ORDER — ONDANSETRON HCL 4 MG PO TABS: 4.0000 mg | ORAL_TABLET | Freq: Three times a day (TID) | ORAL | Status: DC | PRN

## 2020-04-19 MED ORDER — LORAZEPAM 2 MG/ML IJ SOLN: 0.0000 mg | Freq: Two times a day (BID) | INTRAMUSCULAR | Status: DC

## 2020-04-19 MED ORDER — LORAZEPAM 1 MG PO TABS: 0.0000 mg | ORAL_TABLET | Freq: Two times a day (BID) | ORAL | Status: DC

## 2020-04-19 MED ORDER — ALUM & MAG HYDROXIDE-SIMETH 200-200-20 MG/5ML PO SUSP: 30.0000 mL | Freq: Four times a day (QID) | ORAL | Status: DC | PRN

## 2020-04-19 NOTE — ED Notes (Signed)
Per patient, he is requesting to leave AMA. Patient sates the only reason he came today is because his parents were out shopping and he did not want to be home alone. I asked patient if he was feeling suicidal and he stated "no, I just said that because I don't want to be home alone"   Sheriff showed up to [name] at this time.educated patient he may not leave.

## 2020-04-19 NOTE — ED Notes (Signed)
Patient has been talking cyclically stating "this isn't fair" and "I want to leave"  RN has tried to talk to him and help him understand, but he has been angry and not cooperative. Patient reports he drinks 34 Beers a day
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
## 2020-04-19 NOTE — ED Provider Notes (Signed)
[facility] DEPT Provider Note   CSN: [id number] Arrival date & time: 04/19/20  0849     History Chief Complaint  Patient presents with  . Suicidal    John Vaughan is a 29 y.o. male with a history of tobacco abuse, anxiety, depression, and alcohol abuse who presents to the emergency department with suicidal and homicidal ideations recently.  Patient states he has been generally sad/depressed with thoughts of harming himself as well as another individual.  He does not specify who this person is, but states that it is someone who has made things difficult for him and his family.  No specific plan of suicide or homicide.  No attempts reported.  He states sometimes he hears voices.  No alleviating or aggravating factors.  He reports daily alcohol consumption, does have a history of alcohol withdrawal, most recent drink was 1 hour prior to arrival.  Denies current drug use, states he previously did use cocaine.  Denies chest pain, dyspnea, nausea, vomiting, abdominal pain, hallucinations, seizure activity, or tremor.  Patient was brought in via [place] who are  filling out [name] paperwork at this time.    HPI     Past Medical History:  Diagnosis Date  . Anxiety   . Depression     Patient Active Problem List   Diagnosis Date Noted  . GAD (generalized anxiety disorder) 03/09/2019  . Alcohol use disorder, severe, dependence ([place]) 03/09/2019  . Mild episode of recurrent major depressive disorder ([place]) 03/09/2019    No past surgical history on file.     Family History  Problem Relation Age of Onset  . Anxiety disorder Mother   . Alcohol abuse Father   . Anxiety disorder Maternal Aunt   . Anxiety disorder Maternal Grandmother   . Alcohol abuse Paternal Grandfather     Social History   Tobacco Use  . Smoking status: Current Every Day Smoker    Packs/day: 1.00    Types: Cigarettes  . Smokeless tobacco: Former Systems developer    Types:  Snuff  Substance Use Topics  . Alcohol use: Yes    Alcohol/week: 18.0 standard drinks    Types: 18 Cans of beer per week    Comment: Patient has interest in stopping  . Drug use: Not Currently    Home Medications Prior to Admission medications   Medication Sig Start Date End Date Taking? Authorizing Provider  gabapentin (NEURONTIN) 600 MG tablet Take 1 tablet (600 mg total) by mouth 3 (three) times daily. 04/09/19 07/08/19  Pucilowski, Marchia Bond, MD  sertraline (ZOLOFT) 50 MG tablet Take 1 tablet (50 mg total) by mouth daily. 05/04/19 07/03/19  Pucilowski, Marchia Bond, MD    Allergies    Patient has no known allergies.  Review of Systems   Review of Systems  Constitutional: Negative for chills and fever.  Respiratory: Negative for shortness of breath.   Cardiovascular: Negative for chest pain.  Gastrointestinal: Negative for abdominal pain, diarrhea, nausea and vomiting.  Neurological: Negative for tremors, seizures and syncope.  Psychiatric/Behavioral: Positive for hallucinations and suicidal ideas.       Positive for HI.   All other systems reviewed and are negative.   Physical Exam Updated Vital Signs Ht 6' (1.829 m)   Wt 79.4 kg   BMI 23.73 kg/m   Physical Exam Vitals and nursing note reviewed.  Constitutional:      General: He is not in acute distress.    Appearance: He is well-developed. He is  not toxic-appearing.  HENT:     Head: Normocephalic and atraumatic.  Eyes:     General:        Right eye: No discharge.        Left eye: No discharge.     Conjunctiva/sclera: Conjunctivae normal.  Cardiovascular:     Rate and Rhythm: Normal rate and regular rhythm.  Pulmonary:     Effort: Pulmonary effort is normal. No respiratory distress.     Breath sounds: Normal breath sounds. No wheezing, rhonchi or rales.  Abdominal:     General: There is no distension.     Palpations: Abdomen is soft.     Tenderness: There is no abdominal tenderness.  Musculoskeletal:      Cervical back: Neck supple.  Skin:    General: Skin is warm and dry.     Findings: No rash.  Neurological:     Mental Status: He is alert.     Comments: Clear speech.   Psychiatric:        Mood and Affect: Affect is flat.        Thought Content: Thought content includes homicidal and suicidal ideation.     ED Results / Procedures / Treatments   Labs (all labs ordered are listed, but only abnormal results are displayed) Labs Reviewed  CBC - Abnormal; Notable for the following components:      Result Value   RBC 4.07 (*)    MCV 104.4 (*)    MCH 34.9 (*)    Platelets 425 (*)    All other components within normal limits  SALICYLATE LEVEL - Abnormal; Notable for the following components:   Salicylate Lvl <7.0 (*)    All other components within normal limits  COMPREHENSIVE METABOLIC PANEL - Abnormal; Notable for the following components:   Glucose, Bld 113 (*)    Total Protein 8.9 (*)    AST 106 (*)    ALT 101 (*)    All other components within normal limits  ACETAMINOPHEN LEVEL - Abnormal; Notable for the following components:   Acetaminophen (Tylenol), Serum <10 (*)    All other components within normal limits  ETHANOL - Abnormal; Notable for the following components:   Alcohol, Ethyl (B) 306 (*)    All other components within normal limits  RESPIRATORY PANEL BY RT PCR (FLU A&B, COVID)  RAPID URINE DRUG SCREEN, HOSP PERFORMED    EKG None  Radiology No results found.  Procedures Procedures (including critical care time)  Medications Ordered in ED Medications  LORazepam (ATIVAN) injection 0-4 mg (has no administration in time range)    Or  LORazepam (ATIVAN) tablet 0-4 mg (has no administration in time range)  LORazepam (ATIVAN) injection 0-4 mg (has no administration in time range)    Or  LORazepam (ATIVAN) tablet 0-4 mg (has no administration in time range)  thiamine tablet 100 mg (has no administration in time range)    Or  thiamine (B-1) injection 100 mg  (has no administration in time range)    ED Course  I have reviewed the triage vital signs and the nursing notes.  Pertinent labs & imaging results that were available during my care of the patient were reviewed by me and considered in my medical decision making (see chart for details).    Taiten Brawn was evaluated in Emergency Department on 04/19/2020 for the symptoms described in the history of present illness. He/she was evaluated in the context of the global COVID-19 pandemic, which necessitated consideration  that the patient might be at risk for infection with the SARS-CoV-2 virus that causes COVID-19. Institutional protocols and algorithms that pertain to the evaluation of patients at risk for COVID-19 are in a state of rapid change based on information released by regulatory bodies including the CDC and federal and state organizations. These policies and algorithms were followed during the patient's care in the ED.  MDM Rules/Calculators/A&P                     Patient presents to the ED with complaints of SI/HI.  Nontoxic, vitals WNL.  Benign physical exam.  Screening labs notable for alcohol intoxication consistent with reports of last drink 1 hour PTA. LFTs elevated consistent with prior on record in care everywhere.   Patient placed on CIWA protocol due to history of withdrawal, currently intoxicated.  Medically cleared at this time for TTS assessment. Disposition per [facility].   [name] & first look paperwork completed.   Final Clinical Impression(s) / ED Diagnoses Final diagnoses:  Suicidal ideation  Homicidal ideation  Alcohol abuse    Rx / DC Orders ED Discharge Orders    None       Cherly Anderson, PA-C 04/19/20 1455    Lorre Nick, MD 04/20/20 1550

## 2020-04-19 NOTE — ED Notes (Signed)
Pt speaking to Danny, TTS. Remains calm and cooperative at this time.

## 2020-04-19 NOTE — ED Notes (Signed)
Patient relocated to hallway D bed with sitter. Patient has two white "patient belonging bags" labeled with patient stickers. Nurse placed both bags in cabinet labeled "hallway D patient belongings"

## 2020-04-19 NOTE — ED Triage Notes (Signed)
Patient reports he is here for anxiety/depression. When asked if SI present, patient states "kinda". Patient says he was at [facility] recently and discharged last Friday. Patient denies method/plan. Patient wanded by security in triage.

## 2020-04-19 NOTE — ED Notes (Signed)
Pt denies SI/Hi at this time. Pt reports "I just didn't want to be alone.' Pt acknowledges he was recently released from [facility] hospital for alcoholism.  Pt reports drinking "30 beers" this morning.  Pt reports some visual hallucinations at times, denies auditory hallucinations. Pt calm and cooperative at this time. Pt provided with decaf coffee, as requested.

## 2020-04-19 NOTE — ED Notes (Signed)
Patient refused nasal swab

## 2020-04-20 ENCOUNTER — Encounter (HOSPITAL_COMMUNITY): Payer: Self-pay | Admitting: Registered Nurse

## 2020-04-20 DIAGNOSIS — F1094 Alcohol use, unspecified with alcohol-induced mood disorder: Secondary | ICD-10-CM

## 2020-04-20 DIAGNOSIS — F411 Generalized anxiety disorder: Secondary | ICD-10-CM

## 2020-04-20 DIAGNOSIS — F1020 Alcohol dependence, uncomplicated: Secondary | ICD-10-CM

## 2020-04-20 DIAGNOSIS — R45850 Homicidal ideations: Secondary | ICD-10-CM | POA: Insufficient documentation

## 2020-04-20 DIAGNOSIS — F10929 Alcohol use, unspecified with intoxication, unspecified: Secondary | ICD-10-CM

## 2020-04-20 NOTE — ED Provider Notes (Signed)
Emergency Medicine Observation Re-evaluation Note  Rashi Giuliani is a 29 y.o. male, seen on rounds today.  Pt initially presented to the ED for complaints of Suicidal Currently, the patient is resting comfortably.  Physical Exam  BP 128/85   Pulse 67   Temp 99 F (37.2 C) (Oral)   Resp 18   Ht 6' (1.829 m)   Wt 79.4 kg   SpO2 95%   BMI 23.73 kg/m  Physical Exam Vitals and nursing note reviewed.  Constitutional:      Appearance: He is well-developed. He is not toxic-appearing.  HENT:     Head: Atraumatic.  Cardiovascular:     Rate and Rhythm: Normal rate.  Pulmonary:     Effort: Pulmonary effort is normal.  Skin:    General: Skin is warm.  Neurological:     Mental Status: Mental status is at baseline.     ED Course / MDM  EKG:    I have reviewed the labs performed to date as well as medications administered while in observation.  Recent changes in the last 24 hours include : None from the medical perspective. Plan  Current plan is for for patient to be assessed by psych again this morning. Patient is under full [name] at this time.   Derwood Kaplan, MD 04/20/20 [phone]

## 2020-04-20 NOTE — Patient Outreach (Signed)
CPSS met with the patient to provide substance use recovery support and provide information for substance use recovery resources. Patient reports a history of alcohol use. Patient's UDS was positive for benzodiazepines. Patient's EtOH test was positive. Patient was recently discharged from [facility] detox program. Patient is passionate about his substance use recovery process. Patient is interested in getting connected to outpatient substance use treatment resources and is interested in getting connected with a counselor. CPSS talked to the patient about [facility] [employer], [facility], and [facility] as options for dual diagnosis counseling and outpatient treatment. CPSS provided the patient with an outpatient substance use treatment center list for follow up. CPSS also provided the patient with information for several different substance use recovery resources. Some of those resources including residential/detox substance use treatment center list, [place] list, and CPSS contact information. CPSS strongly encouraged the patient to follow up with CPSS if needed for further help with getting connected to substance use treatment resources or any other help with CPSS related substance use recovery services. Patient was pleasant, cooperative, and thanked CPSS for CPSS services provided.

## 2020-04-20 NOTE — Discharge Instructions (Signed)
To help you maintain a sober lifestyle, a substance abuse treatment program may be beneficial to you.  Contact one of the following facilities at your earliest opportunity to ask about enrolling:  RESIDENTIAL PROGRAMS:       [facility]      [address] [place], [place] [address]      [phone]       [facility]      [address] [place], [place] [address]      [phone]       [facility]      [address]      [place], [place] [address]      [phone]       [facility]      [address] [place] [address]  OUTPATIENT PROGRAMS:       [facility]      [address]      [place], [place] [address]      [phone]      New patients are seen at their walk-in clinic.  Walk-in hours are Monday - Friday from 8:30 am - 12:00 pm, and from 1:00 pm - 2:30 pm.  Walk-in patients are seen on a first come, first served basis, so try to arrive as early as possible for the best chance of being seen the same day.

## 2020-04-20 NOTE — BH Assessment (Signed)
[facility] Assessment Progress Note  Per Shuvon Rankin, FNP, this pt does not require psychiatric hospitalization at this time.  Pt presents under [name] initiated by pt's father and upheld by EDP Lorre Nick, MD, which has been rescinded by Nelly Rout, MD.  Pt is to be discharged from [facility] with referral information for area substance abuse treatment providers.  This has been included in pt's discharge instructions.  Pt's nurse has been notified.  Doylene Canning, MA Triage Specialist [phone]

## 2020-04-20 NOTE — Consult Note (Signed)
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 Lucianne Muss; and chart reviewed on 04/20/20.  On evaluation John Vaughan reports yesterday he was feeling lonely and needed someone to talk to.  States that he was brought to the hospital because his parents wants him to slow down on the alcohol.  Patient states that he lives with his parents and is their primary caregivers because both are disabled.  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  Patient also denies prior suicide attempt or history of violence; and has only been inpatient once for alcohol detox.  Patient gave permission to speak to parents John Vaughan (father) and John Vaughan (mother)) for collateral information. During evaluation John Vaughan is alert/oriented x 4; calm/cooperative; and mood is congruent with affect.  He does not appear to be responding to internal/external stimuli or delusional thoughts.  Patient denies suicidal/self-harm/homicidal ideation, psychosis, and paranoia.  Patient answered question appropriately.  Ordered Peer Support for substance use services rehab/outpatient   Collateral information:  Spoke with patients father John Vaughan at [phone]).  John Vaughan reports that his son does live in the home with him and his wife (parents) and states that the patient does help them out but he mostly just  sits around and drink beer and alcohol everyday and all day.  States that yesterday patient got upset when he ran out of beer and was trying to drag him out of the bed to go to store and buy more beer.  States that patient needs to go to rehab because he doesn't want him there if he is gong to continue drinking the way he is.  States that the plan is to have a family meeting if patient is discharged from hospital.  States that the patient has two brothers (1 younger, 1 older) who will be at family meeting and if patient refuses to go get help or rehab services a 50 B will be taking out to keep the patient away from home related to increased agitation, anger, which is worsening with intoxication.  Informed patient would be sent home with resources for long/short rehab facilities, [facility]); and outpatient community resources for substance use    Patient minimizing alcohol use disorder.    Total Time spent with patient: 45 minutes  Past Psychiatric History: Alcohol use disorder  Past Medical History:  Past Medical History:  Diagnosis Date  . Anxiety   . Depression    History reviewed. No pertinent surgical history. Family History:  Family History  Problem Relation Age of Onset  . Anxiety disorder Mother   . Alcohol abuse Father   . Anxiety disorder Maternal Aunt   . Anxiety disorder Maternal Grandmother   . Alcohol abuse Paternal Grandfather    Family Psychiatric  History: Unaware Social History:  Social History   Substance and Sexual Activity  Alcohol Use Yes  . Alcohol/week: 18.0 standard drinks  . Types: 18 Cans of beer per week  Comment: states that he drinks 2-4 beers daily     Social History   Substance and Sexual Activity  Drug Use Not Currently    Social History   Socioeconomic History  . Marital status: Single    Spouse name: Not on file  . Number of children: Not on file  . Years of education: Not on file  . Highest education level: Not on file   Occupational History  . Not on file  Tobacco Use  . Smoking status: Current Every Day Smoker    Packs/day: 1.00    Types: Cigarettes  . Smokeless tobacco: Former Neurosurgeon    Types: Snuff  Substance and Sexual Activity  . Alcohol use: Yes    Alcohol/week: 18.0 standard drinks    Types: 18 Cans of beer per week    Comment: states that he drinks 2-4 beers daily  . Drug use: Not Currently  . Sexual activity: Not Currently  Other Topics Concern  . Not on file  Social History Narrative  . Not on file   Social Determinants of Health   Financial Resource Strain:   . Difficulty of Paying Living Expenses:   Food Insecurity:   . Worried About Programme researcher, broadcasting/film/video in the Last Year:   . Barista in the Last Year:   Transportation Needs:   . Freight forwarder (Medical):   Marland Kitchen Lack of Transportation (Non-Medical):   Physical Activity:   . Days of Exercise per Week:   . Minutes of Exercise per Session:   Stress:   . Feeling of Stress :   Social Connections:   . Frequency of Communication with Friends and Family:   . Frequency of Social Gatherings with Friends and Family:   . Attends Religious Services:   . Active Member of Clubs or Organizations:   . Attends Banker Meetings:   Marland Kitchen Marital Status:     Has this patient used any form of tobacco in the last 30 days? (Cigarettes, Smokeless Tobacco, Cigars, and/or Pipes) A prescription for an FDA-approved tobacco cessation medication was offered at discharge and the patient refused  Current Medications: Current Facility-Administered Medications  Medication Dose Route Frequency Provider Last Rate Last Admin  . alum & mag hydroxide-simeth (MAALOX/MYLANTA) 200-200-20 MG/5ML suspension 30 mL  30 mL Oral Q6H PRN Petrucelli, Samantha R, PA-C      . LORazepam (ATIVAN) injection 0-4 mg  0-4 mg Intravenous Q6H Petrucelli, Samantha R, PA-C       Or  . LORazepam (ATIVAN) tablet 0-4 mg  0-4 mg Oral Q6H Petrucelli, Samantha R,  PA-C   1 mg at 04/20/20 0402  . [START ON 04/21/2020] LORazepam (ATIVAN) injection 0-4 mg  0-4 mg Intravenous Q12H Petrucelli, Samantha R, PA-C       Or  . [START ON 04/21/2020] LORazepam (ATIVAN) tablet 0-4 mg  0-4 mg Oral Q12H Petrucelli, Samantha R, PA-C      . nicotine (NICODERM CQ - dosed in mg/24 hours) patch 21 mg  21 mg Transdermal Daily Petrucelli, Samantha R, PA-C   21 mg at 04/20/20 0932  . ondansetron (ZOFRAN) tablet 4 mg  4 mg Oral Q8H PRN Petrucelli, Samantha R, PA-C      . thiamine tablet 100 mg  100 mg Oral Daily Petrucelli, Samantha R, PA-C   100 mg at 04/20/20 9417   Or  . thiamine (B-1) injection 100 mg  100 mg Intravenous Daily Petrucelli, Samantha R, PA-C  Current Outpatient Medications  Medication Sig Dispense Refill  . cyanocobalamin 100 MCG tablet Take 100 mcg by mouth daily.    Marland Kitchen gabapentin (NEURONTIN) 300 MG capsule Take 300 mg by mouth every 8 (eight) hours. x3days 4.22.21    . sertraline (ZOLOFT) 50 MG tablet Take 50 mg by mouth daily.     PTA Medications: (Not in a hospital admission)   Musculoskeletal: Strength & Muscle Tone: within normal limits Gait & Station: normal Patient leans: N/A  Psychiatric Specialty Exam: Physical Exam Vitals and nursing note reviewed. Exam conducted with a chaperone present.  Constitutional:      Appearance: Normal appearance.  Pulmonary:     Effort: Pulmonary effort is normal.  Neurological:     Mental Status: He is alert.  Psychiatric:        Attention and Perception: Attention and perception normal.        Mood and Affect: Mood and affect normal.        Speech: Speech normal.        Behavior: Behavior normal. Behavior is cooperative.        Thought Content: Thought content normal.        Cognition and Memory: Cognition and memory normal.        Judgment: Judgment normal.     Review of Systems  Psychiatric/Behavioral: Negative for agitation, behavioral problems, decreased concentration, hallucinations,  self-injury, sleep disturbance and suicidal ideas. The patient is not nervous/anxious.        Patient states that he does drink alcohol daily; but states that he is not interested in substance use services.  All other systems reviewed and are negative.   Blood pressure (!) 114/92, pulse 92, temperature 99 F (37.2 C), temperature source Oral, resp. rate 18, height 6' (1.829 m), weight 79.4 kg, SpO2 99 %.Body mass index is 23.73 kg/m.  General Appearance: Casual  Eye Contact:  Good  Speech:  Clear and Coherent and Normal Rate  Volume:  Normal  Mood:  "Fine"  Affect:  Appropriate and Congruent  Thought Process:  Coherent, Goal Directed and Descriptions of Associations: Intact  Orientation:  Full (Time, Place, and Person)  Thought Content:  Logical  Suicidal Thoughts:  No  Homicidal Thoughts:  No  Memory:  Immediate;   Good Recent;   Good  Judgement:  Intact  Insight:  Present  Psychomotor Activity:  Normal  Concentration:  Concentration: Good and Attention Span: Good  Recall:  Good  Fund of Knowledge:  Good  Language:  Good  Akathisia:  No  Handed:  Right  AIMS (if indicated):     Assets:  Communication Skills Desire for Improvement Housing Leisure Time Physical Health Social Support  ADL's:  Intact  Cognition:  WNL  Sleep:        Demographic Factors:  Male, Caucasian and Unemployed  Loss Factors: Financial problems/change in socioeconomic status  Historical Factors: None  Risk Reduction Factors:   Sense of responsibility to family, Religious beliefs about death, Living with another person, especially a relative, Positive social support and Positive therapeutic relationship  Continued Clinical Symptoms:  Alcohol/Substance Abuse/Dependencies  Cognitive Features That Contribute To Risk:  None    Suicide Risk:  Minimal: No identifiable suicidal ideation.  Patients presenting with no risk factors but with morbid ruminations; may be classified as minimal risk  based on the severity of the depressive symptoms  Plan Of Care/Follow-up recommendations:  Activity:  As tolerated Diet:  Heart healthy    Discharge Instructions  To help you maintain a sober lifestyle, a substance abuse treatment program may be beneficial to you.  Contact one of the following facilities at your earliest opportunity to ask about enrolling:  RESIDENTIAL PROGRAMS:       [facility]      [place], [place] [address]      [phone]       [facility]      [address] [place], [place] [address]      [phone]       [facility]      [place], [place] [address]      [phone]       [facility]      [address] [place], [place] [address]  OUTPATIENT PROGRAMS:       [facility] [place], [place] [address]      [phone]      New patients are seen at their walk-in clinic.  Walk-in hours are Monday - Friday from 8:30 am - 12:00 pm, and from 1:00 pm - 2:30 pm.  Walk-in patients are seen on a first come, first served basis, so try to arrive as early as possible for the best chance of being seen the same day.      Ovella Manygoats, NP 04/20/2020, 11:18 AM

## 2020-04-22 ENCOUNTER — Other Ambulatory Visit: Payer: Self-pay

## 2020-04-22 ENCOUNTER — Encounter (HOSPITAL_COMMUNITY): Payer: Self-pay

## 2020-04-22 ENCOUNTER — Emergency Department (HOSPITAL_COMMUNITY): Payer: Self-pay

## 2020-04-22 ENCOUNTER — Emergency Department (HOSPITAL_COMMUNITY)
Admission: EM | Admit: 2020-04-22 | Discharge: 2020-04-23 | Discharge status: Against Medical Advice | Payer: Self-pay | Attending: Emergency Medicine | Admitting: Emergency Medicine

## 2020-04-22 DIAGNOSIS — S022XXB Fracture of nasal bones, initial encounter for open fracture: Secondary | ICD-10-CM | POA: Insufficient documentation

## 2020-04-22 DIAGNOSIS — S0121XA Laceration without foreign body of nose, initial encounter: Secondary | ICD-10-CM | POA: Insufficient documentation

## 2020-04-22 DIAGNOSIS — Y929 Unspecified place or not applicable: Secondary | ICD-10-CM | POA: Insufficient documentation

## 2020-04-22 DIAGNOSIS — Z5320 Procedure and treatment not carried out because of patient's decision for unspecified reasons: Secondary | ICD-10-CM | POA: Insufficient documentation

## 2020-04-22 DIAGNOSIS — W1830XA Fall on same level, unspecified, initial encounter: Secondary | ICD-10-CM | POA: Insufficient documentation

## 2020-04-22 DIAGNOSIS — Z79899 Other long term (current) drug therapy: Secondary | ICD-10-CM | POA: Insufficient documentation

## 2020-04-22 DIAGNOSIS — F1010 Alcohol abuse, uncomplicated: Secondary | ICD-10-CM

## 2020-04-22 DIAGNOSIS — F17210 Nicotine dependence, cigarettes, uncomplicated: Secondary | ICD-10-CM | POA: Insufficient documentation

## 2020-04-22 DIAGNOSIS — R569 Unspecified convulsions: Secondary | ICD-10-CM | POA: Insufficient documentation

## 2020-04-22 DIAGNOSIS — Y9301 Activity, walking, marching and hiking: Secondary | ICD-10-CM | POA: Insufficient documentation

## 2020-04-22 DIAGNOSIS — Y999 Unspecified external cause status: Secondary | ICD-10-CM | POA: Insufficient documentation

## 2020-04-22 DIAGNOSIS — F10239 Alcohol dependence with withdrawal, unspecified: Secondary | ICD-10-CM | POA: Insufficient documentation

## 2020-04-22 DIAGNOSIS — S01511A Laceration without foreign body of lip, initial encounter: Secondary | ICD-10-CM | POA: Insufficient documentation

## 2020-04-22 DIAGNOSIS — F10939 Alcohol use, unspecified with withdrawal, unspecified: Secondary | ICD-10-CM

## 2020-04-22 LAB — URINALYSIS, ROUTINE W REFLEX MICROSCOPIC
Bilirubin Urine: NEGATIVE
Glucose, UA: NEGATIVE mg/dL
Hgb urine dipstick: NEGATIVE
Ketones, ur: NEGATIVE mg/dL
Leukocytes,Ua: NEGATIVE
Nitrite: NEGATIVE
Protein, ur: NEGATIVE mg/dL
Specific Gravity, Urine: 1.002 — ABNORMAL LOW (ref 1.005–1.030)
pH: 6 (ref 5.0–8.0)

## 2020-04-22 LAB — ETHANOL: Alcohol, Ethyl (B): <10 mg/dL (ref <10)

## 2020-04-22 LAB — CBC WITH DIFFERENTIAL/PLATELET
Abs Immature Granulocytes: 0.03 10*3/uL (ref 0.00–0.07)
Basophils Absolute: 0.1 10*3/uL (ref 0.0–0.1)
Basophils Relative: 2 %
Eosinophils Absolute: 0.2 10*3/uL (ref 0.0–0.5)
Eosinophils Relative: 3 %
HCT: 37 % — ABNORMAL LOW (ref 39.0–52.0)
Hemoglobin: 12.1 g/dL — ABNORMAL LOW (ref 13.0–17.0)
Immature Granulocytes: 0 %
Lymphocytes Relative: 16 %
Lymphs Abs: 1.3 10*3/uL (ref 0.7–4.0)
MCH: 34.6 pg — ABNORMAL HIGH (ref 26.0–34.0)
MCHC: 32.7 g/dL (ref 30.0–36.0)
MCV: 105.7 fL — ABNORMAL HIGH (ref 80.0–100.0)
Monocytes Absolute: 1.3 10*3/uL — ABNORMAL HIGH (ref 0.1–1.0)
Monocytes Relative: 17 %
Neutro Abs: 4.8 10*3/uL (ref 1.7–7.7)
Neutrophils Relative %: 62 %
Platelets: 333 10*3/uL (ref 150–400)
RBC: 3.5 MIL/uL — ABNORMAL LOW (ref 4.22–5.81)
RDW: 11.9 % (ref 11.5–15.5)
WBC: 7.6 10*3/uL (ref 4.0–10.5)
nRBC: 0 % (ref 0.0–0.2)

## 2020-04-22 LAB — COMPREHENSIVE METABOLIC PANEL
ALT: 45 U/L — ABNORMAL HIGH (ref 0–44)
AST: 41 U/L (ref 15–41)
Albumin: 3.7 g/dL (ref 3.5–5.0)
Alkaline Phosphatase: 33 U/L — ABNORMAL LOW (ref 38–126)
Anion gap: 11 (ref 5–15)
BUN: 6 mg/dL (ref 6–20)
CO2: 20 mmol/L — ABNORMAL LOW (ref 22–32)
Calcium: 9.5 mg/dL (ref 8.9–10.3)
Chloride: 104 mmol/L (ref 98–111)
Creatinine, Ser: 0.96 mg/dL (ref 0.61–1.24)
GFR calc Af Amer: >60 mL/min (ref >60)
GFR calc non Af Amer: >60 mL/min (ref >60)
Glucose, Bld: 123 mg/dL — ABNORMAL HIGH (ref 70–99)
Potassium: 3.6 mmol/L (ref 3.5–5.1)
Sodium: 135 mmol/L (ref 135–145)
Total Bilirubin: 0.7 mg/dL (ref 0.3–1.2)
Total Protein: 6.5 g/dL (ref 6.5–8.1)

## 2020-04-22 LAB — SALICYLATE LEVEL: Salicylate Lvl: <7 mg/dL — ABNORMAL LOW (ref 7.0–30.0)

## 2020-04-22 LAB — ACETAMINOPHEN LEVEL: Acetaminophen (Tylenol), Serum: <10 ug/mL — ABNORMAL LOW (ref 10–30)

## 2020-04-22 MED ORDER — CEFAZOLIN SODIUM-DEXTROSE 2-4 GM/100ML-% IV SOLN
2.0000 g | Freq: Once | INTRAVENOUS | Status: AC
  Administered 2020-04-22: 2 g via INTRAVENOUS
  Filled 2020-04-22: qty 100

## 2020-04-22 MED ORDER — LIDOCAINE HCL (PF) 1 % IJ SOLN
5.0000 mL | Freq: Once | INTRAMUSCULAR | Status: AC
  Administered 2020-04-22: 5 mL
  Filled 2020-04-22: qty 5

## 2020-04-22 MED ORDER — CLONAZEPAM 0.5 MG PO TABS
0.5000 mg | ORAL_TABLET | Freq: Once | ORAL | Status: AC
  Administered 2020-04-22: 0.5 mg via ORAL
  Filled 2020-04-22: qty 1

## 2020-04-22 MED ORDER — CLONAZEPAM 0.5 MG PO TABS
0.5000 mg | ORAL_TABLET | Freq: Two times a day (BID) | ORAL | 0 refills | Status: DC | PRN
Start: 2020-04-22 — End: 2021-05-30

## 2020-04-22 MED ORDER — SODIUM CHLORIDE 0.9 % IV BOLUS
1000.0000 mL | Freq: Once | INTRAVENOUS | Status: AC
  Administered 2020-04-22: 1000 mL via INTRAVENOUS

## 2020-04-22 NOTE — ED Notes (Signed)
Suture cart, suture tray, and dermabond at the bedside

## 2020-04-22 NOTE — Discharge Instructions (Signed)
Given you medication to help with your alcohol withdrawal.  Given you resources for outpatient management.  You are leaving AGAINST MEDICAL ADVICE.  If your symptoms worsen please seek reevaluation  You need to follow-up with the ear nose and throat physicians in 5 days.  You will need to call them to schedule an appointment.  Contact information is listed in your discharge paperwork.

## 2020-04-22 NOTE — ED Notes (Signed)
Pt stated he is unable to provide a urine sample at this time.  

## 2020-04-22 NOTE — ED Provider Notes (Addendum)
[facility] EMERGENCY DEPARTMENT Provider Note   CSN: [id number] Arrival date & time: 04/22/20  2127     History Chief Complaint  Patient presents with  . Seizures    S/P fall    John Vaughan is a 29 y.o. male with past medical history significant for alcohol withdrawal seizure, alcohol abuse, GAD who presents for evaluation of seizure.  Was walking and lives with family member when had a seizure.  Noted urinary incontinence.  Was recently hospitalized at [facility] admitted to ICU on Precedex for DTs and seizure.  Initially patient states his last drink was prior to his recent hospitalization however upon prior review patient was seen 2 days ago for SI at [facility].  Had admitted to previous provider that he had gone on an alcohol bender previously.  I discussed this with patient and he admits to recent alcohol use post discharge.  Was taking gabapentin which was previously prescribed.  Admits to drinking 32 beers previously daily.  Been in rehab multiple times for alcohol abuse.  Patient admits to nasal bridge pain however is at his baseline mentation on arrival per family.  Does not want his family members to know that he has continued to drink alcohol.  Denies headache, lightheadedness, dizziness, vision changes, neck pain, chest pain, shortness of breath, abdominal pain, diarrhea, dysuria.  Does have bilateral tremors or denies paresthesias.  Denies additional aggravating or alleviating factors.  No preceding headache.  Denies intentional overdose on otc medication with intent at self harm.  History obtained from patient, past medical records.  No interpreter is used.   From dc note at [facility]. Patient dc on 04/13/20. "29 years old male with PMH of alcohol abuse, DTs, withdrawal seizures, tobacco abuse, depression, admitted to [facility] on 04/09/2020 for alcohol withdrawal.  He was treated for alcohol withdrawal initially with Neurontin, Librium taper.  As he was going into DTs, he was transferred to ICU on 4/18 ,treated with Precedex drip. He is weaned off of the Precedex drip on 4/20 morning. He is transferred to hospitalist service on 4/20.  Complete ROS are negative except mild hand tremors. He is followed by psychiatry during the hospital stay. He is discharged to home on Neurontin taper ( 600 mg TID x2 days, 300 mg TID for 3 days).   He has major depressive disorder, he is started on Zoloft 50 mg daily. He is discharged to home on 04/13/2020. He is recommended to follow-up with outpatient psychiatry and behavioral health in 1 to 2 weeks. "    HPI     Past Medical History:  Diagnosis Date  . Anxiety   . Depression     Patient Active Problem List   Diagnosis Date Noted  . 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 Homicidal ideation   . GAD (generalized anxiety disorder) 03/09/2019  . Alcohol use disorder, severe, dependence (HCC) 03/09/2019  . 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 No pertinent surgical history.     Family History  Problem Relation Age of Onset  . Anxiety disorder Mother   . Alcohol abuse Father   . Anxiety disorder Maternal Aunt   . Anxiety disorder Maternal Grandmother   . Alcohol abuse Paternal Grandfather     Social History   Tobacco Use  . Smoking status: Current Every Day Smoker    Packs/day: 1.00    Types: Cigarettes  . Smokeless  tobacco: Former Systems developer    Types: Snuff  Substance Use Topics  . Alcohol use: Yes    Alcohol/week: 18.0 standard drinks    Types: 18 Cans of beer per week    Comment: states that he drinks 2-4 beers daily  . Drug use: Not Currently    Home Medications Prior to Admission medications   Medication Sig Start Date End Date Taking? Authorizing Provider  gabapentin (NEURONTIN) 300 MG capsule Take 900 mg by mouth 3 (three) times daily.  04/14/20  Yes [provider]  Multiple Vitamins-Minerals (MULTIVITAMIN GUMMIES ADULT) CHEW Chew 2 tablets by mouth daily.   Yes [provider]  sertraline (ZOLOFT) 50 MG tablet Take 50 mg by mouth daily. 04/14/20 05/14/20 Yes [provider]  clonazePAM (KLONOPIN) 0.5 MG tablet Take 1 tablet (0.5 mg total) by mouth 2 (two) times daily as needed for anxiety. 04/22/20   Cathalina Barcia A, PA-C    Allergies    Lorazepam  Review of Systems   Review of Systems  Constitutional: Negative.   HENT: Negative.   Respiratory: Negative.   Cardiovascular: Negative.   Gastrointestinal: Negative.   Musculoskeletal: Negative.   Skin: Positive for wound.  Neurological: Positive for tremors and seizures. Negative for dizziness, syncope, facial asymmetry, speech difficulty, weakness, light-headedness, numbness and headaches.  All other systems reviewed and are negative.   Physical Exam Updated Vital Signs BP (!) 119/91   Pulse 79   Temp 97.9 F (36.6 C) (Oral)   Resp 18   Ht 6' (1.829 m)   Wt 79.4 kg   SpO2 99%   BMI 23.73 kg/m   Physical Exam Vitals and nursing note reviewed.  Constitutional:      General: He is not in acute distress.    Appearance: He is well-developed. He is not ill-appearing, toxic-appearing or diaphoretic.  HENT:     Head: Normocephalic. Abrasion and laceration present. No raccoon eyes or Battle's sign.     Jaw: There is normal jaw occlusion.     Right Ear: Tympanic membrane, ear canal and external ear normal. No hemotympanum.     Left Ear: Tympanic membrane, ear canal and external ear normal. No hemotympanum.     Nose: Signs of injury, laceration and nasal tenderness present.      Comments: Tenderness palpation over nasal bridge with overlying laceration.  Laceration approximately 1 cm.  No septal hematoma however does have dried blood in bilateral nares.    Mouth/Throat:     Lips: Pink.     Mouth: Mucous membranes are moist.     Pharynx: Oropharynx is clear. Uvula  midline.     Comments: 4 mm superficial laceration, nongaping to inner upper lip.  Does not require sutures.  Multiple fractured teeth. Eyes:     Extraocular Movements: Extraocular movements intact.     Conjunctiva/sclera: Conjunctivae normal.     Pupils: Pupils are equal, round, and reactive to light.     Comments: EOMs intact, PERRLA  Neck:     Trachea: Trachea and phonation normal.     Comments: C-collar in place Cardiovascular:     Rate and Rhythm: Normal rate and regular rhythm.     Pulses: Normal pulses.          Radial pulses are 2+ on the right side and 2+ on the left side.       Dorsalis pedis pulses are 2+ on the right side and 2+ on the left side.  Posterior tibial pulses are 2+ on the right side and 2+ on the left side.     Heart sounds: Normal heart sounds.  Pulmonary:     Effort: Pulmonary effort is normal. No respiratory distress.     Breath sounds: Normal breath sounds and air entry.     Comments: Clear to auscultation bilateral wheeze, rhonchi or rales Chest:     Comments: Old ecchymosis approximately 4 centimeters to left posterior chest wall.  Nontender palpation.  No crepitus or step-offs. Abdominal:     General: Bowel sounds are normal. There is no distension.     Palpations: Abdomen is soft.     Tenderness: There is no abdominal tenderness.     Comments: Soft, nontender without rebound or guarding.  No overlying skin changes  Musculoskeletal:        General: Normal range of motion.     Right shoulder: Normal.     Left shoulder: Normal.     Right elbow: Normal.     Left elbow: Normal.     Right wrist: Normal.     Left wrist: Normal.     Cervical back: Normal, full passive range of motion without pain, normal range of motion and neck supple.     Thoracic back: Normal.     Lumbar back: Normal.     Right hip: Normal.     Left hip: Normal.     Right upper leg: Normal.     Left upper leg: Normal.     Right knee: Normal.     Left knee: Normal.      Right lower leg: Normal.     Left lower leg: Normal.     Right foot: Normal.     Left foot: Normal.  Feet:     Right foot:     Skin integrity: Skin integrity normal.     Left foot:     Skin integrity: Skin integrity normal.  Skin:    General: Skin is warm and dry.     Capillary Refill: Capillary refill takes less than 2 seconds.     Comments: Brisk capillary refill.  1 cm laceration to nasal bridge without bleeding or drainage.  There is underlying fracture.  Patient also superficial 4 mm laceration to upper lip.  Neurological:     General: No focal deficit present.     Mental Status: He is alert.     Cranial Nerves: Cranial nerves are intact.     Sensory: Sensation is intact.     Motor: Motor function is intact.     Coordination: Coordination is intact.     Gait: Gait is intact.     Comments: Cranial nerves II through XII grossly intact. Bilateral hand tremor Ambulatory without ataxic gait   Psychiatric:     Comments: Denies SI, HI, AVH. Linear through process      ED Results / Procedures / Treatments   Labs (all labs ordered are listed, but only abnormal results are displayed) Labs Reviewed  CBC WITH DIFFERENTIAL/PLATELET - Abnormal; Notable for the following components:      Result Value   RBC 3.50 (*)    Hemoglobin 12.1 (*)    HCT 37.0 (*)    MCV 105.7 (*)    MCH 34.6 (*)    Monocytes Absolute 1.3 (*)    All other components within normal limits  COMPREHENSIVE METABOLIC PANEL - Abnormal; Notable for the following components:   CO2 20 (*)    Glucose, Bld 123 (*)  ALT 45 (*)    Alkaline Phosphatase 33 (*)    All other components within normal limits  RAPID URINE DRUG SCREEN, HOSP PERFORMED - Abnormal; Notable for the following components:   Benzodiazepines POSITIVE (*)    All other components within normal limits  URINALYSIS, ROUTINE W REFLEX MICROSCOPIC - Abnormal; Notable for the following components:   Color, Urine COLORLESS (*)    Specific Gravity, Urine  1.002 (*)    All other components within normal limits  SALICYLATE LEVEL - Abnormal; Notable for the following components:   Salicylate Lvl <7.0 (*)    All other components within normal limits  ACETAMINOPHEN LEVEL - Abnormal; Notable for the following components:   Acetaminophen (Tylenol), Serum <10 (*)    All other components within normal limits  ETHANOL    EKG None  Radiology CT Head Wo Contrast  Result Date: 04/22/2020 CLINICAL DATA:  Seizure, nontraumatic (Age 72-40y) Unwitnessed fall with seizure-like activity. EXAM: CT HEAD WITHOUT CONTRAST TECHNIQUE: Contiguous axial images were obtained from the base of the skull through the vertex without intravenous contrast. COMPARISON:  None. FINDINGS: Brain: Mild generalized atrophy, but advanced for age. No intracranial hemorrhage, mass effect, or midline shift. No hydrocephalus. The basilar cisterns are patent. No evidence of territorial infarct or acute ischemia. No extra-axial or intracranial fluid collection. Vascular: No hyperdense vessel or unexpected calcification. Skull: No fracture or focal lesion. Sinuses/Orbits: Assessed on concurrent face CT, reported separately. Other: None. IMPRESSION: 1. No acute intracranial abnormality. No skull fracture. 2. Mild generalized atrophy, advanced for age. Electronically Signed   By: Narda Rutherford M.D.   On: 04/22/2020 22:17   CT Cervical Spine Wo Contrast  Result Date: 04/22/2020 CLINICAL DATA:  Unwitnessed fall with seizure-like activity. Neck trauma, uncomplicated (NEXUS/CCR neg) (Age 62-64y) EXAM: CT CERVICAL SPINE WITHOUT CONTRAST TECHNIQUE: Multidetector CT imaging of the cervical spine was performed without intravenous contrast. Multiplanar CT image reconstructions were also generated. COMPARISON:  None. FINDINGS: Alignment: Normal. Skull base and vertebrae: No acute fracture. Vertebral body heights are maintained. The dens and skull base are intact. Soft tissues and spinal canal: No  prevertebral fluid or swelling. No visible canal hematoma. Disc levels:  Disc spaces are preserved. Upper chest: No acute findings. Other: None. IMPRESSION: No fracture or subluxation of the cervical spine. Electronically Signed   By: Narda Rutherford M.D.   On: 04/22/2020 22:25   CT Maxillofacial Wo Contrast  Result Date: 04/22/2020 CLINICAL DATA:  Facial trauma. Abrasion and swelling to bridge of nose. Chipped tooth. Right cheek swelling. Seizure. EXAM: CT MAXILLOFACIAL WITHOUT CONTRAST TECHNIQUE: Multidetector CT imaging of the maxillofacial structures was performed. Multiplanar CT image reconstructions were also generated. COMPARISON:  None. FINDINGS: Osseous: Comminuted and depressed bilateral nasal bone fractures, left greater than right. Adjacent nasal laceration and soft tissue air adjacent to the nasal bridge. Zygomatic arches and mandibles are intact. No fracture of the maxilla. No fracture of the pterygoid plates. Anterior translation of the mandibular condyles with respect to the temporomandibular joints is symmetric and likely positioning. Mild motion artifact through the anterior lower mandible Orbits: No acute orbital fracture. No globe injury. Sinuses: Mucosal thickening with small fluid level in the right maxillary sinus but no definite hemosinus or sinus fracture. There is opacification of scattered ethmoid air cells and right frontal sinus. Right frontal sinus fluid level without evidence of sinus fracture. Scattered mucosal thickening involving the sphenoid sinus and left maxillary sinus. Mastoid air cells are clear. Soft tissues: Nasal soft tissue  edema with subcutaneous gas related to nasal bone fracture. Mild patchy soft tissue edema involving the right cheek. Limited intracranial: Assessed on concurrent head CT, reported separately IMPRESSION: 1. Comminuted and depressed bilateral nasal bone fractures, left greater than right. Adjacent nasal laceration and soft tissue air adjacent to the  nasal bridge. 2. No acute orbital or facial bone fracture. 3. Scattered paranasal sinus mucosal thickening with fluid levels in the right maxillary and right frontal sinuses. No definite hemosinus or sinus fracture. Electronically Signed   By: Narda RutherfordMelanie  Sanford M.D.   On: 04/22/2020 22:23    Procedures .Marland Kitchen.Laceration Repair  Date/Time: 04/22/2020 11:51 PM Performed by: Linwood DibblesHenderly, Shelma Eiben A, PA-C Authorized by: Linwood DibblesHenderly, Zaahir Pickney A, PA-C   Consent:    Consent obtained:  Verbal   Consent given by:  Patient   Risks discussed:  Infection, need for additional repair, pain, poor cosmetic result and poor wound healing   Alternatives discussed:  No treatment and delayed treatment Universal protocol:    Procedure explained and questions answered to patient or proxy's satisfaction: yes     Relevant documents present and verified: yes     Test results available and properly labeled: yes     Imaging studies available: yes     Required blood products, implants, devices, and special equipment available: yes     Site/side marked: yes     Immediately prior to procedure, a time out was called: yes     Patient identity confirmed:  Verbally with patient Anesthesia (see MAR for exact dosages):    Anesthesia method:  Local infiltration   Local anesthetic:  Lidocaine 1% w/o epi Laceration details:    Location:  Face   Face location:  Nose   Length (cm):  1   Depth (mm):  4 Repair type:    Repair type:  Intermediate Pre-procedure details:    Preparation:  Patient was prepped and draped in usual sterile fashion and imaging obtained to evaluate for foreign bodies Exploration:    Hemostasis achieved with:  Direct pressure   Wound exploration: wound explored through full range of motion and entire depth of wound probed and visualized     Wound extent: underlying fracture     Contaminated: no   Treatment:    Area cleansed with:  Betadine   Amount of cleaning:  Extensive   Irrigation volume:  1L   Irrigation  method:  Pressure wash Skin repair:    Repair method:  Sutures   Suture size:  5-0   Suture material:  Prolene   Suture technique:  Simple interrupted   Number of sutures:  3 Approximation:    Approximation:  Close Post-procedure details:    Dressing:  Non-adherent dressing   Patient tolerance of procedure:  Tolerated well, no immediate complications .Marland Kitchen.Laceration Repair  Date/Time: 04/22/2020 11:52 PM Performed by: Linwood DibblesHenderly, Sherissa Tenenbaum A, PA-C Authorized by: Linwood DibblesHenderly, Vaun Hyndman A, PA-C   Consent:    Consent obtained:  Verbal   Consent given by:  Patient   Risks discussed:  Infection, need for additional repair, pain, poor cosmetic result and poor wound healing   Alternatives discussed:  No treatment, delayed treatment, observation and referral Universal protocol:    Procedure explained and questions answered to patient or proxy's satisfaction: yes     Relevant documents present and verified: yes     Test results available and properly labeled: yes     Imaging studies available: yes     Required blood products, implants, devices, and special equipment  available: yes     Site/side marked: yes     Immediately prior to procedure, a time out was called: yes     Patient identity confirmed:  Verbally with patient Anesthesia (see MAR for exact dosages):    Anesthesia method:  None Laceration details:    Location:  Lip   Lip location:  Upper exterior lip   Length (cm):  0.4   Depth (mm):  1 Repair type:    Repair type:  Simple Pre-procedure details:    Preparation:  Patient was prepped and draped in usual sterile fashion and imaging obtained to evaluate for foreign bodies Exploration:    Wound extent: no foreign bodies/material noted, no muscle damage noted, no nerve damage noted, no tendon damage noted, no underlying fracture noted and no vascular damage noted     Contaminated: no   Treatment:    Area cleansed with:  Betadine   Amount of cleaning:  Extensive   Irrigation solution:   Sterile saline   Irrigation volume:  1L   Irrigation method:  Pressure wash Skin repair:    Repair method:  Tissue adhesive Approximation:    Approximation:  Close   Vermilion border: well-aligned   Post-procedure details:    Dressing:  Open (no dressing)   Patient tolerance of procedure:  Tolerated well, no immediate complications   (including critical care time)  Medications Ordered in ED Medications  sodium chloride 0.9 % bolus 1,000 mL (0 mLs Intravenous Stopped 04/22/20 2323)  ceFAZolin (ANCEF) IVPB 2g/100 mL premix (0 g Intravenous Stopped 04/22/20 2350)  lidocaine (PF) (XYLOCAINE) 1 % injection 5 mL (5 mLs Infiltration Given 04/22/20 2322)  clonazePAM (KLONOPIN) tablet 0.5 mg (0.5 mg Oral Given 04/22/20 2350)    ED Course  I have reviewed the triage vital signs and the nursing notes.  Pertinent labs & imaging results that were available during my care of the patient were reviewed by me and considered in my medical decision making (see chart for details).  29 year old male presents for evaluation of seizure.  Has known alcohol use syndrome with withdrawal seizures.  Recently discharged 9 days ago after ICU admission and Precedex drip at [facility].  Patient initially stated his last drink was 4 days ago however then stated it was prior to his last hospitalization.  He was seen in the ED 2 days ago for SI.  At that time had admitted to alcohol abuse.  I did discuss this with the patient.  States he has been drinking since his discharge however no alcohol over the last 24 hours. Does not want family to know. Patient with laceration to nasal bridge.  Last tetanus less than 2 years ago.  He has a nonfocal neuro exam without deficits and is at his baseline mentation per family.  He denies any illicit drug use or preceding symptoms prior to his seizure.  Plan labs, imaging and reassess. Has been compliant with Gabapentin he was dc with at [facility]. Normal MSK exam with non-focal  neuro exam.  Labs and imaging personally viewed interpreted: CBC without leukocytosis, hemoglobin at baseline Metabolic panel with mild hyperglycemia to 123 however no additional treatment, renal normality Ethanol, salicylate, acetaminophen negative UA negative for infection CT head negative for acute process CT max face with comminuted, depressed bilateral nasal fractures with overlying laceration CT cervical without any acute abnormality  Consult with Dr. Annalee Genta with ENT.  Recommends Ancef, sutured nasal fracture and he will follow-up in office in 5 days.  Patient does have some fractured teeth however they appear stable at this time.  Ancef, 2 g ordered.  Laceration sutured.  See procedure note for both sutures.    Discussed with patient recommended inpatient admission for withdrawal seizure.  Patient is adamant that he does not want inpatient admission.  Patient does not appear actively intoxicated, appears to have capacity make medical decisions.  He denies any SI, HI, AVH. No asterixis.   Does have bilateral hand tremors however is mentation appropriately, no N/V, tachycardia, elevated blood pressure.  Patient seen evaluated by attending, Dr. Stevie Kern he discussed in patient admission with patient.  Patient refuses inpatient admission is requesting to leave AGAINST MEDICAL ADVICE.  States Librium has not worked for his prior withdrawal.  Will give small dose of Klonopin as patient states this is helped with his prior withdrawals.  Given resources for outpatient management.  We discussed the nature and purpose, risks and benefits, as well as, the alternatives of treatment. Time was given to allow the opportunity to ask questions and consider their options, and after the discussion, the patient decided to refuse the offerred treatment. The patient was informed that refusal could lead to, but was not limited to, death, permanent disability, or severe pain. If present, I asked the  relatives or significant others to dissuade them without success. Prior to refusing, I determined that the patient had the capacity to make their decision and understood the consequences of that decision. After refusal, I made every reasonable opportunity to treat them to the best of my ability.  The patient was notified that they may return to the emergency department at any time for further treatment.      MDM Rules/Calculators/A&P                      Final Clinical Impression(s) / ED Diagnoses Final diagnoses:  Seizures (HCC)  Alcohol abuse  Alcohol withdrawal syndrome with complication (HCC)  Open fracture of nasal bone, initial encounter  Laceration of nose, initial encounter  Lip laceration, initial encounter    Rx / DC Orders ED Discharge Orders         Ordered    clonazePAM (KLONOPIN) 0.5 MG tablet  2 times daily PRN     04/22/20 2325           Gadiel John A, PA-C 04/23/20 0010    Demara Lover A, PA-C 04/23/20 0017    Milagros Loll, MD 04/23/20 1205

## 2020-04-22 NOTE — ED Notes (Signed)
Pt left AMA °

## 2020-04-22 NOTE — ED Triage Notes (Signed)
Pt arrived via [facility]; pt had an unwitnessed fall with seizure like activity; pt was face down with abrasions/swelling to bridge of now, and chipped tooth with R cheek swelling; Hx of seizures;only med is Gabapentin; Pt has hx of ETOH last drik 5 days ago and normally 12pk daily decreased from 32 daily per patient

## 2020-04-23 LAB — RAPID URINE DRUG SCREEN, HOSP PERFORMED
Amphetamines: NOT DETECTED
Barbiturates: NOT DETECTED
Benzodiazepines: POSITIVE — AB
Cocaine: NOT DETECTED
Opiates: NOT DETECTED
Tetrahydrocannabinol: NOT DETECTED

## 2020-04-27 DIAGNOSIS — F1094 Alcohol use, unspecified with alcohol-induced mood disorder: Secondary | ICD-10-CM | POA: Insufficient documentation

## 2020-04-29 MED ORDER — QUINTABS PO TABS
1.00 | ORAL_TABLET | ORAL | Status: DC
Start: 2020-04-28 — End: 2020-04-29

## 2020-04-29 MED ORDER — THIAMINE HCL 100 MG PO TABS
100.00 | ORAL_TABLET | ORAL | Status: DC
Start: 2020-04-28 — End: 2020-04-29

## 2020-04-29 MED ORDER — ONDANSETRON 4 MG PO TBDP
4.00 | ORAL_TABLET | ORAL | Status: DC
Start: ? — End: 2020-04-29

## 2020-04-29 MED ORDER — CHLORDIAZEPOXIDE HCL 25 MG PO CAPS
50.00 | ORAL_CAPSULE | ORAL | Status: DC
Start: ? — End: 2020-04-29

## 2020-04-29 MED ORDER — FOLIC ACID 1 MG PO TABS
1.00 | ORAL_TABLET | ORAL | Status: DC
Start: 2020-04-28 — End: 2020-04-29

## 2020-04-29 MED ORDER — GENERIC EXTERNAL MEDICATION
2.00 | Status: DC
Start: ? — End: 2020-04-29

## 2020-05-26 ENCOUNTER — Other Ambulatory Visit: Payer: Self-pay

## 2020-05-26 DIAGNOSIS — R110 Nausea: Secondary | ICD-10-CM | POA: Insufficient documentation

## 2020-05-26 DIAGNOSIS — D759 Disease of blood and blood-forming organs, unspecified: Secondary | ICD-10-CM | POA: Insufficient documentation

## 2020-05-26 DIAGNOSIS — R000 Tachycardia, unspecified: Secondary | ICD-10-CM | POA: Insufficient documentation

## 2020-05-26 DIAGNOSIS — R251 Tremor, unspecified: Secondary | ICD-10-CM | POA: Insufficient documentation

## 2020-05-26 DIAGNOSIS — F17210 Nicotine dependence, cigarettes, uncomplicated: Secondary | ICD-10-CM | POA: Insufficient documentation

## 2020-05-26 DIAGNOSIS — F1910 Other psychoactive substance abuse, uncomplicated: Secondary | ICD-10-CM | POA: Insufficient documentation

## 2020-05-26 NOTE — ED Triage Notes (Signed)
Arrived by EMS from home. Patient quit drinking yesterday and has been vomiting since Saturday. Patient is here to help get help with alcohol detox. Patient states he has tried to get into detox program before without success. Patient received 500 mL 0.9% NS and 4mg  Zofran IV en route to this facility.

## 2020-05-27 ENCOUNTER — Emergency Department (HOSPITAL_COMMUNITY)
Admission: EM | Admit: 2020-05-27 | Discharge: 2020-05-27 | Disposition: A | Discharge status: Home / Self Care | Payer: Self-pay | Attending: Emergency Medicine | Admitting: Emergency Medicine

## 2020-05-27 DIAGNOSIS — R110 Nausea: Secondary | ICD-10-CM

## 2020-05-27 DIAGNOSIS — F1910 Other psychoactive substance abuse, uncomplicated: Secondary | ICD-10-CM

## 2020-05-27 DIAGNOSIS — D759 Disease of blood and blood-forming organs, unspecified: Secondary | ICD-10-CM

## 2020-05-27 LAB — COMPREHENSIVE METABOLIC PANEL
ALT: 58 U/L — ABNORMAL HIGH (ref 0–44)
AST: 122 U/L — ABNORMAL HIGH (ref 15–41)
Albumin: 4.6 g/dL (ref 3.5–5.0)
Alkaline Phosphatase: 60 U/L (ref 38–126)
Anion gap: 18 — ABNORMAL HIGH (ref 5–15)
BUN: 9 mg/dL (ref 6–20)
CO2: 22 mmol/L (ref 22–32)
Calcium: 9.3 mg/dL (ref 8.9–10.3)
Chloride: 99 mmol/L (ref 98–111)
Creatinine, Ser: 0.69 mg/dL (ref 0.61–1.24)
GFR calc Af Amer: >60 mL/min (ref >60)
GFR calc non Af Amer: >60 mL/min (ref >60)
Glucose, Bld: 73 mg/dL (ref 70–99)
Potassium: 4.5 mmol/L (ref 3.5–5.1)
Sodium: 139 mmol/L (ref 135–145)
Total Bilirubin: 1.1 mg/dL (ref 0.3–1.2)
Total Protein: 8.3 g/dL — ABNORMAL HIGH (ref 6.5–8.1)

## 2020-05-27 LAB — CBC WITH DIFFERENTIAL/PLATELET
Abs Immature Granulocytes: 0.01 10*3/uL (ref 0.00–0.07)
Basophils Absolute: 0 10*3/uL (ref 0.0–0.1)
Basophils Relative: 1 %
Eosinophils Absolute: 0 10*3/uL (ref 0.0–0.5)
Eosinophils Relative: 0 %
HCT: 46.1 % (ref 39.0–52.0)
Hemoglobin: 15.5 g/dL (ref 13.0–17.0)
Immature Granulocytes: 0 %
Lymphocytes Relative: 15 %
Lymphs Abs: 0.5 10*3/uL — ABNORMAL LOW (ref 0.7–4.0)
MCH: 34.8 pg — ABNORMAL HIGH (ref 26.0–34.0)
MCHC: 33.6 g/dL (ref 30.0–36.0)
MCV: 103.6 fL — ABNORMAL HIGH (ref 80.0–100.0)
Monocytes Absolute: 0.6 10*3/uL (ref 0.1–1.0)
Monocytes Relative: 20 %
Neutro Abs: 2 10*3/uL (ref 1.7–7.7)
Neutrophils Relative %: 64 %
Platelets: 50 10*3/uL — ABNORMAL LOW (ref 150–400)
RBC: 4.45 MIL/uL (ref 4.22–5.81)
RDW: 12.6 % (ref 11.5–15.5)
WBC: 3.1 10*3/uL — ABNORMAL LOW (ref 4.0–10.5)
nRBC: 0 % (ref 0.0–0.2)

## 2020-05-27 LAB — LIPASE, BLOOD: Lipase: 44 U/L (ref 11–51)

## 2020-05-27 MED ORDER — ONDANSETRON HCL 4 MG PO TABS
4.0000 mg | ORAL_TABLET | Freq: Four times a day (QID) | ORAL | 0 refills | Status: DC
Start: 2020-05-27 — End: 2021-05-30

## 2020-05-27 MED ORDER — CHLORDIAZEPOXIDE HCL 25 MG PO CAPS
ORAL_CAPSULE | ORAL | 0 refills | Status: DC
Start: 2020-05-27 — End: 2021-03-19

## 2020-05-27 MED ORDER — SODIUM CHLORIDE 0.9 % IV BOLUS
1000.0000 mL | Freq: Once | INTRAVENOUS | Status: AC
  Administered 2020-05-27: 1000 mL via INTRAVENOUS

## 2020-05-27 MED ORDER — CHLORDIAZEPOXIDE HCL 25 MG PO CAPS
25.0000 mg | ORAL_CAPSULE | Freq: Once | ORAL | Status: AC
  Administered 2020-05-27: 25 mg via ORAL
  Filled 2020-05-27: qty 1

## 2020-05-27 NOTE — ED Provider Notes (Signed)
[facility]-EMERGENCY DEPT Provider Note   CSN: [id number] Arrival date & time: 05/26/20  2055     History Chief Complaint  Patient presents with  . Detox    John Vaughan is a 29 y.o. male.  HPI   Department chief complaint of alcohol withdrawals.  Patient stated that his last drink was yesterday and he has felt nauseous since Sunday.  Patient states that he has tried to stop drinking and resulted in him becoming extremely nauseous and unable to hold food down.  He also admits that he has had some occasional fever and chills but denies ever taking his temperature.  Patient has in the past stop drinking alcohol but denies alcoholic induced seizure or delirium.  Patient denies any suicidal thoughts or harming others.  Patient states he wants to be enrolled in a detox program.  Patient does not have any significant medical history, he does not take any medications on a daily basis.  He denies use of drugs, but admits that he smokes cigarettes.  Patient denies fever, headache, if congestion, sore throat, shortness of breath, chest pain, abdominal pain, difficulty urinating, diarrhea, leg pain or swelling. Past Medical History:  Diagnosis Date  . Anxiety   . Depression     Patient Active Problem List   Diagnosis Date Noted  . 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 Homicidal ideation   . GAD (generalized anxiety disorder) 03/09/2019  . Alcohol use disorder, severe, dependence (HCC) 03/09/2019  . Mild episode of recurrent major depressive disorder (HCC) 03/09/2019    No past surgical history on file.     Family History  Problem Relation Age of Onset  . Anxiety disorder Mother   . Alcohol abuse Father   . Anxiety disorder Maternal Aunt   . Anxiety disorder Maternal Grandmother   . Alcohol abuse Paternal Grandfather     Social History   Tobacco Use  . Smoking status: Current Every Day Smoker     Packs/day: 1.00    Types: Cigarettes  . Smokeless tobacco: Former Neurosurgeon    Types: Snuff  Substance Use Topics  . Alcohol use: Yes    Alcohol/week: 18.0 standard drinks    Types: 18 Cans of beer per week    Comment: states that he drinks 2-4 beers daily  . Drug use: Not Currently    Home Medications Prior to Admission medications   Medication Sig Start Date End Date Taking? Authorizing Provider  chlordiazePOXIDE (LIBRIUM) 25 MG capsule 50mg  PO TID x 1D, then 25-50mg  PO BID X 1D, then 25-50mg  PO QD X 1D 05/27/20   07/27/20, PA-C  clonazePAM (KLONOPIN) 0.5 MG tablet Take 1 tablet (0.5 mg total) by mouth 2 (two) times daily as needed for anxiety. 04/22/20   Henderly, Britni A, PA-C  gabapentin (NEURONTIN) 300 MG capsule Take 900 mg by mouth 3 (three) times daily.  04/14/20   [provider]  Multiple Vitamins-Minerals (MULTIVITAMIN GUMMIES ADULT) CHEW Chew 2 tablets by mouth daily.    [provider]  ondansetron (ZOFRAN) 4 MG tablet Take 1 tablet (4 mg total) by mouth every 6 (six) hours. 05/27/20   07/27/20, PA-C    Allergies    Lorazepam  Review of Systems   Review of Systems  Constitutional: Positive for chills. Negative for appetite change, diaphoresis and fever.  HENT: Negative for congestion and sore throat.   Eyes: Negative for visual disturbance.  Respiratory: Negative for  cough and shortness of breath.   Cardiovascular: Negative for chest pain and leg swelling.  Gastrointestinal: Positive for nausea and vomiting. Negative for abdominal pain, constipation and diarrhea.  Genitourinary: Negative for dysuria and enuresis.  Musculoskeletal: Negative for back pain and neck pain.  Skin: Negative for rash.  Neurological: Positive for tremors. Negative for dizziness, light-headedness and headaches.  Hematological: Does not bruise/bleed easily.    Physical Exam Updated Vital Signs BP 132/89   Pulse 86   Temp 99.5 F (37.5 C) (Oral)   Resp 16    Ht 6' (1.829 m)   Wt 79.4 kg   SpO2 98%   BMI 23.73 kg/m   Physical Exam Vitals and nursing note reviewed.  Constitutional:      General: He is not in acute distress.    Appearance: He is not ill-appearing.  HENT:     Head: Normocephalic and atraumatic.     Nose: No congestion.     Mouth/Throat:     Mouth: Mucous membranes are moist.     Pharynx: Oropharynx is clear.  Eyes:     General: No scleral icterus. Cardiovascular:     Rate and Rhythm: Regular rhythm. Tachycardia present.     Pulses: Normal pulses.     Heart sounds: No murmur. No friction rub. No gallop.   Pulmonary:     Effort: No respiratory distress.     Breath sounds: No wheezing, rhonchi or rales.  Abdominal:     General: There is no distension.     Palpations: Abdomen is soft.     Tenderness: There is no abdominal tenderness. There is no guarding.  Musculoskeletal:        General: No swelling or tenderness.     Right lower leg: No edema.     Left lower leg: No edema.  Skin:    General: Skin is warm and dry.     Capillary Refill: Capillary refill takes less than 2 seconds.     Findings: No bruising or rash.  Neurological:     Mental Status: He is alert and oriented to person, place, and time.  Psychiatric:        Mood and Affect: Mood normal.     ED Results / Procedures / Treatments   Labs (all labs ordered are listed, but only abnormal results are displayed) Labs Reviewed  COMPREHENSIVE METABOLIC PANEL - Abnormal; Notable for the following components:      Result Value   Total Protein 8.3 (*)    AST 122 (*)    ALT 58 (*)    Anion gap 18 (*)    All other components within normal limits  CBC WITH DIFFERENTIAL/PLATELET - Abnormal; Notable for the following components:   WBC 3.1 (*)    MCV 103.6 (*)    MCH 34.8 (*)    Platelets 50 (*)    Lymphs Abs 0.5 (*)    All other components within normal limits  LIPASE, BLOOD    EKG None  Radiology No results found.  Procedures Procedures  (including critical care time)  Medications Ordered in ED Medications  sodium chloride 0.9 % bolus 1,000 mL (0 mLs Intravenous Stopped 05/27/20 0248)  chlordiazePOXIDE (LIBRIUM) capsule 25 mg (25 mg Oral Given 05/27/20 0059)    ED Course  I have reviewed the triage vital signs and the nursing notes.  Pertinent labs & imaging results that were available during my care of the patient were reviewed by me and considered in my medical  decision making (see chart for details).    MDM Rules/Calculators/A&P                     I have personally reviewed all imaging, labs and have interpreted them.  Due to the patient's presentation and history I am concerned for delusions and seizures induced  alcohol withdrawal,  hypoglycemia.  Patient was given IV fluids, Zofran and Librium.  I have low suspicion for delusions as the patient denies any hallucinations and has not had them in the past.  I also have low suspicion for alcoholic seizures as patient denies having a seizure in the past from alcohol withdrawals and is currently not seizing.  Patient has CBC showing  leukocytopenia as well as cytopenia which is new from his baseline.  CMP showed no electrolyte abnormalities, elevated AST and ALT with a high anion gap.  His lipase was 44 and low suspicion for pancreatitis as his physical exam did not show epigastric pain.   Patient is improved after being given Librium and some fluids.  He is tolerating p.o. and states that he feels better than when he first came in.  Patient was informed about his new onset of cytopenia and he was warned that he should stay for further evaluation.  Patient states he would like to leave AMA and would rather follow-up in [facility].  Patient was explained the risk of leaving such as bleeding, inability to find infection and possible death but patient states he still would rather leave.  Patient was given at home precautions as well as Librium taper.  He was instructed to follow-up  with a primary care doctor as well as a hematologist.  Patient was given strict return precautions.  Patient was discussed with attending and he agrees with plan.  Results and plan was discussed with the patient, patient verbalized that he understood and agrees with above plan.  Final Clinical Impression(s) / ED Diagnoses Final diagnoses:  Polysubstance abuse (HCC)  Nausea  Cytopenia    Rx / DC Orders ED Discharge Orders         Ordered    chlordiazePOXIDE (LIBRIUM) 25 MG capsule     05/27/20 0251    ondansetron (ZOFRAN) 4 MG tablet  Every 6 hours     05/27/20 0251           Carroll Sage, PA-C 05/27/20 0301    Mesner, Barbara Cower, MD 05/27/20 1157

## 2020-05-27 NOTE — Discharge Instructions (Addendum)
You have been treated for alcohol withdrawal.  I have prescribed you Librium please take as prescribed.  I have also prescribed him Zofran please take as needed for nausea.  Provided you with resources for counseling on substance abuse.  You have low white blood cells as well as platelets I need you to follow-up with a primary care doctor or a hematologist for further evaluation as this can be life-threatening.  I want you to come back to the emergency department if you develop chest pain, shortness of breath, uncontrolled nausea, vomiting, diarrhea, abdominal pain, fever or rectal bleeding as the symptoms require further evaluation.

## 2020-05-27 NOTE — ED Notes (Signed)
Call received from pt father Secundino Ellithorpe [phone] requesting rtn call for pt status/updates. RN advised. [employer]

## 2020-05-27 NOTE — ED Notes (Signed)
Pa notified of CIWA score of 17

## 2020-06-06 DIAGNOSIS — F1220 Cannabis dependence, uncomplicated: Secondary | ICD-10-CM | POA: Insufficient documentation

## 2020-06-16 ENCOUNTER — Emergency Department (HOSPITAL_COMMUNITY)
Admission: EM | Admit: 2020-06-16 | Discharge: 2020-06-16 | Disposition: A | Discharge status: Home / Self Care | Payer: Self-pay | Attending: Emergency Medicine | Admitting: Emergency Medicine

## 2020-06-16 ENCOUNTER — Encounter (HOSPITAL_COMMUNITY): Payer: Self-pay

## 2020-06-16 ENCOUNTER — Other Ambulatory Visit: Payer: Self-pay

## 2020-06-16 DIAGNOSIS — Z046 Encounter for general psychiatric examination, requested by authority: Secondary | ICD-10-CM | POA: Insufficient documentation

## 2020-06-16 DIAGNOSIS — Z20822 Contact with and (suspected) exposure to covid-19: Secondary | ICD-10-CM | POA: Insufficient documentation

## 2020-06-16 DIAGNOSIS — F1014 Alcohol abuse with alcohol-induced mood disorder: Secondary | ICD-10-CM | POA: Insufficient documentation

## 2020-06-16 DIAGNOSIS — F1010 Alcohol abuse, uncomplicated: Secondary | ICD-10-CM

## 2020-06-16 DIAGNOSIS — Z87891 Personal history of nicotine dependence: Secondary | ICD-10-CM | POA: Insufficient documentation

## 2020-06-16 DIAGNOSIS — F1020 Alcohol dependence, uncomplicated: Secondary | ICD-10-CM | POA: Diagnosis present

## 2020-06-16 DIAGNOSIS — R443 Hallucinations, unspecified: Secondary | ICD-10-CM | POA: Insufficient documentation

## 2020-06-16 DIAGNOSIS — F10929 Alcohol use, unspecified with intoxication, unspecified: Secondary | ICD-10-CM | POA: Diagnosis present

## 2020-06-16 LAB — COMPREHENSIVE METABOLIC PANEL
ALT: 180 U/L — ABNORMAL HIGH (ref 0–44)
AST: 130 U/L — ABNORMAL HIGH (ref 15–41)
Albumin: 4.7 g/dL (ref 3.5–5.0)
Alkaline Phosphatase: 53 U/L (ref 38–126)
Anion gap: 16 — ABNORMAL HIGH (ref 5–15)
BUN: 7 mg/dL (ref 6–20)
CO2: 21 mmol/L — ABNORMAL LOW (ref 22–32)
Calcium: 9.3 mg/dL (ref 8.9–10.3)
Chloride: 103 mmol/L (ref 98–111)
Creatinine, Ser: 0.83 mg/dL (ref 0.61–1.24)
GFR calc Af Amer: >60 mL/min (ref >60)
GFR calc non Af Amer: >60 mL/min (ref >60)
Glucose, Bld: 103 mg/dL — ABNORMAL HIGH (ref 70–99)
Potassium: 4.1 mmol/L (ref 3.5–5.1)
Sodium: 140 mmol/L (ref 135–145)
Total Bilirubin: 0.4 mg/dL (ref 0.3–1.2)
Total Protein: 8.3 g/dL — ABNORMAL HIGH (ref 6.5–8.1)

## 2020-06-16 LAB — ETHANOL: Alcohol, Ethyl (B): 418 mg/dL (ref <10)

## 2020-06-16 LAB — CBC
HCT: 38.6 % — ABNORMAL LOW (ref 39.0–52.0)
Hemoglobin: 13.2 g/dL (ref 13.0–17.0)
MCH: 35.3 pg — ABNORMAL HIGH (ref 26.0–34.0)
MCHC: 34.2 g/dL (ref 30.0–36.0)
MCV: 103.2 fL — ABNORMAL HIGH (ref 80.0–100.0)
Platelets: 347 10*3/uL (ref 150–400)
RBC: 3.74 MIL/uL — ABNORMAL LOW (ref 4.22–5.81)
RDW: 13 % (ref 11.5–15.5)
WBC: 4.8 10*3/uL (ref 4.0–10.5)
nRBC: 0 % (ref 0.0–0.2)

## 2020-06-16 LAB — SARS CORONAVIRUS 2 BY RT PCR (HOSPITAL ORDER, PERFORMED IN ~~LOC~~ HOSPITAL LAB): SARS Coronavirus 2: NEGATIVE

## 2020-06-16 LAB — RAPID URINE DRUG SCREEN, HOSP PERFORMED
Amphetamines: NOT DETECTED
Barbiturates: NOT DETECTED
Benzodiazepines: POSITIVE — AB
Cocaine: NOT DETECTED
Opiates: NOT DETECTED
Tetrahydrocannabinol: NOT DETECTED

## 2020-06-16 MED ORDER — CHLORDIAZEPOXIDE HCL 25 MG PO CAPS: 25.0000 mg | ORAL_CAPSULE | Freq: Every day | ORAL | Status: DC

## 2020-06-16 MED ORDER — THIAMINE HCL 100 MG/ML IJ SOLN
100.0000 mg | Freq: Once | INTRAMUSCULAR | Status: AC
  Administered 2020-06-16: 100 mg via INTRAMUSCULAR
  Filled 2020-06-16: qty 2

## 2020-06-16 MED ORDER — CHLORDIAZEPOXIDE HCL 25 MG PO CAPS: 25.0000 mg | ORAL_CAPSULE | Freq: Three times a day (TID) | ORAL | Status: DC

## 2020-06-16 MED ORDER — CHLORDIAZEPOXIDE HCL 25 MG PO CAPS
50.0000 mg | ORAL_CAPSULE | Freq: Once | ORAL | Status: AC
  Administered 2020-06-16: 50 mg via ORAL
  Filled 2020-06-16: qty 2

## 2020-06-16 MED ORDER — ALUM & MAG HYDROXIDE-SIMETH 200-200-20 MG/5ML PO SUSP: 30.0000 mL | Freq: Four times a day (QID) | ORAL | Status: DC | PRN

## 2020-06-16 MED ORDER — ONDANSETRON HCL 4 MG PO TABS: 4.0000 mg | ORAL_TABLET | Freq: Three times a day (TID) | ORAL | Status: DC | PRN

## 2020-06-16 MED ORDER — THIAMINE HCL 100 MG PO TABS: 100.0000 mg | ORAL_TABLET | Freq: Every day | ORAL | Status: DC

## 2020-06-16 MED ORDER — ADULT MULTIVITAMIN W/MINERALS CH: 1.0000 | ORAL_TABLET | Freq: Every day | ORAL | Status: DC

## 2020-06-16 MED ORDER — IBUPROFEN 200 MG PO TABS: 600.0000 mg | ORAL_TABLET | Freq: Three times a day (TID) | ORAL | Status: DC | PRN

## 2020-06-16 MED ORDER — CHLORDIAZEPOXIDE HCL 25 MG PO CAPS: 25.0000 mg | ORAL_CAPSULE | Freq: Four times a day (QID) | ORAL | Status: DC

## 2020-06-16 MED ORDER — NICOTINE 21 MG/24HR TD PT24: 21.0000 mg | MEDICATED_PATCH | Freq: Every day | TRANSDERMAL | Status: DC

## 2020-06-16 MED ORDER — CHLORDIAZEPOXIDE HCL 25 MG PO CAPS: 25.0000 mg | ORAL_CAPSULE | ORAL | Status: DC
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
## 2020-06-16 NOTE — [facility] Counselor (Signed)
TTS spoke with Nurse Carollee Herter, pt BAL was at 418 , pt is currently too intoxicated to assess at this time, TTS to assess patient when oriented enough to complete.

## 2020-06-16 NOTE — Consult Note (Signed)
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  Patient alert and oriented, answers appropriately. Patient denies suicidal ideations.  Patient denies history of suicide attempt, denies history of self-harm behaviors.  Patient denies homicidal ideations.  Patient denies auditory and visual hallucinations.  Patient denies symptoms of paranoia. Patient reports currently followed by outpatient psychiatry at [facility].  Patient reports next visit will be scheduled for July 2021. Patient reports he currently resides in [place] with his parents.  Patient denies access to weapons.  Patient reports he currently receives unemployment benefits because "nobody will hire me but I am looking for a job." Patient reports history of alcohol use disorder.  Patient states "I started drinking at age 25 and I never stopped."  Patient reports completed detox program at [facility] last Sunday.  Patient reports currently he does not want to pursue inpatient rehab, believes "I feel like I can do this on my own."  Patient reports use of approximately 32 beers daily.  Patient able to discuss risk included with continued use of alcohol. Patient states "I understand I am hurting my liver and I feel like I am damaging my eyesight with my drinking." After further discussion patient reports he would consider inpatient substance use treatment if he were allowed to smoke cigarettes.  Patient also reports recent difficulty with reading, possibly related to his eyesight.  Patient also  reports concerns with handwriting, states "at [facility] you have to go to classes and write things down, my handwriting is like that of a 77-year-old."  Patient gives verbal consent to speak with his father, Adhvik Canady [phone]. Spoke with patient's father, Hoyte Ziebell.  Patient's father reports "he has been drinking and he is not supposed to."  Patient's father states "he has got to go to detox and to rehab, he cannot come here like that, me and his mom have had it."  Patient's father reports patient recently took $125 that belonged to his parents and used the money to buy beer.  Total Time spent with patient: 30 minutes  Past Psychiatric History: Alcohol use disorder, generalized anxiety disorder, MDD  Past Medical History:  Past Medical History:  Diagnosis Date  . Anxiety   . Depression    History reviewed. No pertinent surgical history. Family History:  Family History  Problem Relation Age of Onset  . Anxiety disorder Mother   . Alcohol abuse Father   . Anxiety disorder Maternal Aunt   . Anxiety disorder Maternal Grandmother   . Alcohol abuse Paternal Grandfather    Family Psychiatric  History: Mother-depression Social History:  Social History   Substance and Sexual Activity  Alcohol Use Yes  . Alcohol/week: 18.0 standard drinks  . Types: 18 Cans of beer per week   Comment: states that he drinks 2-4 beers daily     Social History   Substance and Sexual Activity  Drug Use Not Currently    Social History   Socioeconomic History  . Marital status: Single    Spouse name: Not on file  . Number of children: Not on file  . Years of education: Not on file  .  Highest education level: Not on file  Occupational History  . Not on file  Tobacco Use  . Smoking status: Current Every Day Smoker    Packs/day: 1.00    Types: Cigarettes  . Smokeless tobacco: Former Neurosurgeon    Types: Snuff  Vaping Use  . Vaping Use: Never used  Substance and Sexual Activity  . Alcohol use:  Yes    Alcohol/week: 18.0 standard drinks    Types: 18 Cans of beer per week    Comment: states that he drinks 2-4 beers daily  . Drug use: Not Currently  . Sexual activity: Not Currently  Other Topics Concern  . Not on file  Social History Narrative  . Not on file   Social Determinants of Health   Financial Resource Strain:   . Difficulty of Paying Living Expenses:   Food Insecurity:   . Worried About Programme researcher, broadcasting/film/video in the Last Year:   . Barista in the Last Year:   Transportation Needs:   . Freight forwarder (Medical):   Marland Kitchen Lack of Transportation (Non-Medical):   Physical Activity:   . Days of Exercise per Week:   . Minutes of Exercise per Session:   Stress:   . Feeling of Stress :   Social Connections:   . Frequency of Communication with Friends and Family:   . Frequency of Social Gatherings with Friends and Family:   . Attends Religious Services:   . Active Member of Clubs or Organizations:   . Attends Banker Meetings:   Marland Kitchen Marital Status:     Has this patient used any form of tobacco in the last 30 days? (Cigarettes, Smokeless Tobacco, Cigars, and/or Pipes) A prescription for an FDA-approved tobacco cessation medication was offered at discharge and the patient refused  Current Medications: Current Facility-Administered Medications  Medication Dose Route Frequency Provider Last Rate Last Admin  . alum & mag hydroxide-simeth (MAALOX/MYLANTA) 200-200-20 MG/5ML suspension 30 mL  30 mL Oral Q6H PRN Petrucelli, Samantha R, PA-C      . chlordiazePOXIDE (LIBRIUM) capsule 25 mg  25 mg Oral QID Petrucelli, Samantha R, PA-C       Followed by  . [START ON 06/17/2020] chlordiazePOXIDE (LIBRIUM) capsule 25 mg  25 mg Oral TID Petrucelli, Samantha R, PA-C       Followed by  . [START ON 06/18/2020] chlordiazePOXIDE (LIBRIUM) capsule 25 mg  25 mg Oral BH-qamhs Petrucelli, Samantha R, PA-C       Followed by  . [START ON 06/19/2020] chlordiazePOXIDE (LIBRIUM)  capsule 25 mg  25 mg Oral Daily Petrucelli, Samantha R, PA-C      . ibuprofen (ADVIL) tablet 600 mg  600 mg Oral Q8H PRN Petrucelli, Samantha R, PA-C      . multivitamin with minerals tablet 1 tablet  1 tablet Oral Daily Petrucelli, Samantha R, PA-C      . nicotine (NICODERM CQ - dosed in mg/24 hours) patch 21 mg  21 mg Transdermal Daily Petrucelli, Samantha R, PA-C      . ondansetron (ZOFRAN) tablet 4 mg  4 mg Oral Q8H PRN Petrucelli, Samantha R, PA-C      . [START ON 06/17/2020] thiamine tablet 100 mg  100 mg Oral Daily Petrucelli, Samantha R, PA-C       Current Outpatient Medications  Medication Sig Dispense Refill  . chlordiazePOXIDE (LIBRIUM) 25 MG capsule 50mg  PO TID x 1D, then 25-50mg  PO BID X 1D, then 25-50mg  PO QD X 1D  10 capsule 0  . clonazePAM (KLONOPIN) 0.5 MG tablet Take 1 tablet (0.5 mg total) by mouth 2 (two) times daily as needed for anxiety. (Patient taking differently: Take 1 mg by mouth daily. ) 15 tablet 0  . gabapentin (NEURONTIN) 300 MG capsule Take 900 mg by mouth 3 (three) times daily.     . sertraline (ZOLOFT) 50 MG tablet Take 50 mg by mouth daily.    . ondansetron (ZOFRAN) 4 MG tablet Take 1 tablet (4 mg total) by mouth every 6 (six) hours. (Patient not taking: Reported on 06/16/2020) 12 tablet 0   PTA Medications: (Not in a hospital admission)   Musculoskeletal: Strength & Muscle Tone: within normal limits Gait & Station: normal Patient leans: N/A  Psychiatric Specialty Exam: Physical Exam Vitals and nursing note reviewed.  Constitutional:      Appearance: He is well-developed.  HENT:     Head: Normocephalic.  Cardiovascular:     Rate and Rhythm: Normal rate.  Pulmonary:     Effort: Pulmonary effort is normal.  Neurological:     Mental Status: He is alert and oriented to person, place, and time.  Psychiatric:        Mood and Affect: Mood normal.        Behavior: Behavior normal.        Thought Content: Thought content normal.        Judgment:  Judgment normal.     Review of Systems  Constitutional: Negative.   HENT: Negative.   Eyes: Negative.   Respiratory: Negative.   Cardiovascular: Negative.   Gastrointestinal: Negative.   Genitourinary: Negative.   Musculoskeletal: Negative.   Skin: Negative.   Neurological: Negative.   Psychiatric/Behavioral: Negative.     Blood pressure 117/85, pulse 74, temperature 97.8 F (36.6 C), temperature source Oral, resp. rate 18, SpO2 98 %.There is no height or weight on file to calculate BMI.  General Appearance: Casual and Fairly Groomed  Eye Contact:  Good  Speech:  Clear and Coherent and Normal Rate  Volume:  Normal  Mood:  Euthymic  Affect:  Appropriate and Congruent  Thought Process:  Coherent, Goal Directed and Descriptions of Associations: Intact  Orientation:  Full (Time, Place, and Person)  Thought Content:  WDL and Logical  Suicidal Thoughts:  No  Homicidal Thoughts:  No  Memory:  Immediate;   Good Recent;   Good Remote;   Good  Judgement:  Fair  Insight:  Fair  Psychomotor Activity:  Normal  Concentration:  Concentration: Good and Attention Span: Good  Recall:  Good  Fund of Knowledge:  Good  Language:  Good  Akathisia:  No  Handed:  Right  AIMS (if indicated):     Assets:  Communication Skills Desire for Improvement Financial Resources/Insurance Intimacy Leisure Time Physical Health Resilience Social Support  ADL's:  Intact  Cognition:  WNL  Sleep:        Demographic Factors:  Male and Caucasian  Loss Factors: NA  Historical Factors: NA  Risk Reduction Factors:   Employed, Living with another person, especially a relative, Positive social support, Positive therapeutic relationship and Positive coping skills or problem solving skills  Continued Clinical Symptoms:  Alcohol/Substance Abuse/Dependencies  Cognitive Features That Contribute To Risk:  None    Suicide Risk:  Minimal: No identifiable suicidal ideation.  Patients presenting with  no risk factors but with morbid ruminations; may be classified as minimal risk based on the severity of the depressive symptoms    Plan Of  Care/Follow-up recommendations:  Other:  Follow up with established outpatient psychiatry, [facility]  Disposition: Discharge with outpatient resources Patrcia Dolly, FNP 06/16/2020, 10:32 AM

## 2020-06-16 NOTE — ED Notes (Signed)
Patient wanded by security and belongings placed in locker 28.

## 2020-06-21 ENCOUNTER — Emergency Department (HOSPITAL_COMMUNITY)
Admission: EM | Admit: 2020-06-21 | Discharge: 2020-06-21 | Disposition: A | Discharge status: Home / Self Care | Payer: Self-pay | Attending: Emergency Medicine | Admitting: Emergency Medicine

## 2020-06-21 ENCOUNTER — Encounter (HOSPITAL_COMMUNITY): Payer: Self-pay

## 2020-06-21 DIAGNOSIS — F17210 Nicotine dependence, cigarettes, uncomplicated: Secondary | ICD-10-CM | POA: Insufficient documentation

## 2020-06-21 DIAGNOSIS — F10129 Alcohol abuse with intoxication, unspecified: Secondary | ICD-10-CM | POA: Insufficient documentation

## 2020-06-21 DIAGNOSIS — F10920 Alcohol use, unspecified with intoxication, uncomplicated: Secondary | ICD-10-CM

## 2020-06-21 DIAGNOSIS — Z658 Other specified problems related to psychosocial circumstances: Secondary | ICD-10-CM
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
## 2020-07-14 DIAGNOSIS — G8929 Other chronic pain: Secondary | ICD-10-CM | POA: Insufficient documentation

## 2020-07-14 DIAGNOSIS — Z8739 Personal history of other diseases of the musculoskeletal system and connective tissue: Secondary | ICD-10-CM | POA: Insufficient documentation

## 2020-08-18 ENCOUNTER — Encounter (HOSPITAL_COMMUNITY): Payer: Self-pay | Admitting: Emergency Medicine

## 2020-08-18 ENCOUNTER — Emergency Department (HOSPITAL_COMMUNITY)
Admission: EM | Admit: 2020-08-18 | Discharge: 2020-08-19 | Disposition: A | Discharge status: Home / Self Care | Payer: Self-pay | Attending: Emergency Medicine | Admitting: Emergency Medicine

## 2020-08-18 ENCOUNTER — Other Ambulatory Visit: Payer: Self-pay

## 2020-08-18 DIAGNOSIS — Z20822 Contact with and (suspected) exposure to covid-19: Secondary | ICD-10-CM | POA: Insufficient documentation

## 2020-08-18 DIAGNOSIS — F1010 Alcohol abuse, uncomplicated: Secondary | ICD-10-CM | POA: Insufficient documentation

## 2020-08-18 DIAGNOSIS — F10920 Alcohol use, unspecified with intoxication, uncomplicated: Secondary | ICD-10-CM

## 2020-08-18 DIAGNOSIS — F17210 Nicotine dependence, cigarettes, uncomplicated: Secondary | ICD-10-CM | POA: Insufficient documentation

## 2020-08-18 HISTORY — DX: Alcohol abuse, uncomplicated: F10.10

## 2020-08-18 LAB — CBC
HCT: 41.9 % (ref 39.0–52.0)
Hemoglobin: 14.8 g/dL (ref 13.0–17.0)
MCH: 35.5 pg — ABNORMAL HIGH (ref 26.0–34.0)
MCHC: 35.3 g/dL (ref 30.0–36.0)
MCV: 100.5 fL — ABNORMAL HIGH (ref 80.0–100.0)
Platelets: 284 10*3/uL (ref 150–400)
RBC: 4.17 MIL/uL — ABNORMAL LOW (ref 4.22–5.81)
RDW: 11.8 % (ref 11.5–15.5)
WBC: 8.4 10*3/uL (ref 4.0–10.5)
nRBC: 0 % (ref 0.0–0.2)

## 2020-08-18 LAB — COMPREHENSIVE METABOLIC PANEL
ALT: 14 U/L (ref 0–44)
AST: 19 U/L (ref 15–41)
Albumin: 4.5 g/dL (ref 3.5–5.0)
Alkaline Phosphatase: 48 U/L (ref 38–126)
Anion gap: 13 (ref 5–15)
BUN: <5 mg/dL — ABNORMAL LOW (ref 6–20)
CO2: 21 mmol/L — ABNORMAL LOW (ref 22–32)
Calcium: 9.1 mg/dL (ref 8.9–10.3)
Chloride: 100 mmol/L (ref 98–111)
Creatinine, Ser: 0.82 mg/dL (ref 0.61–1.24)
GFR calc Af Amer: >60 mL/min (ref >60)
GFR calc non Af Amer: >60 mL/min (ref >60)
Glucose, Bld: 99 mg/dL (ref 70–99)
Potassium: 4 mmol/L (ref 3.5–5.1)
Sodium: 134 mmol/L — ABNORMAL LOW (ref 135–145)
Total Bilirubin: 0.5 mg/dL (ref 0.3–1.2)
Total Protein: 7.9 g/dL (ref 6.5–8.1)

## 2020-08-18 LAB — RAPID URINE DRUG SCREEN, HOSP PERFORMED
Amphetamines: NOT DETECTED
Barbiturates: NOT DETECTED
Benzodiazepines: NOT DETECTED
Cocaine: NOT DETECTED
Opiates: NOT DETECTED
Tetrahydrocannabinol: NOT DETECTED

## 2020-08-18 LAB — ETHANOL: Alcohol, Ethyl (B): 310 mg/dL (ref <10)

## 2020-08-18 LAB — SARS CORONAVIRUS 2 BY RT PCR (HOSPITAL ORDER, PERFORMED IN ~~LOC~~ HOSPITAL LAB): SARS Coronavirus 2: NEGATIVE

## 2020-08-18 MED ORDER — THIAMINE HCL 100 MG/ML IJ SOLN: 100.0000 mg | Freq: Every day | INTRAMUSCULAR | Status: DC

## 2020-08-18 MED ORDER — THIAMINE HCL 100 MG PO TABS: 100.0000 mg | ORAL_TABLET | Freq: Every day | ORAL | Status: DC

## 2020-08-18 NOTE — BHH Counselor (Signed)
Clinician spoke to [facility], Press photographer, expressing she is ready to see the pt but needs his [name] paperwork. Charge Nurse expressed she will fax it.    [name], MS, [facility], [facility] Triage Specialist [phone]
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
## 2020-08-18 NOTE — BHH Counselor (Signed)
Clinician checked Onbase pt's [name] paperwork has not been received. Clinician also attempted to call the pt's nurse however the call went to voicemail. Clinician to check back and move to next person in que.    [name], MS, [facility], [facility], [facility] Triage Specialist [phone]

## 2020-08-18 NOTE — BHH Counselor (Signed)
John Vaughan, NS/NT to have pt's nurse to fax pt's [name] paperwork to clinician.   Clinician to engage pt on TTS assessment once [name] paperwork is received.   [name], MS, [facility], [facility] Triage Specialist [phone]

## 2020-08-19 NOTE — BHH Counselor (Signed)
Clinician called but was unable to leave a HIPPA compliant voice message for [name] petitioner/pt's father John Vaughan, [phone].    [name], MS, [facility], [facility] Triage Specialist [phone]

## 2020-08-19 NOTE — Patient Outreach (Signed)
ED Peer Support Specialist Patient Intake (Complete at intake & 30-60 Day Follow-up)  Name: John Vaughan  MRN: [id number]  Age: 29 y.o.   Date of Admission: 08/19/2020  Intake: Initial Comments:      Primary Reason Admitted: [name]   Lab values: Alcohol/ETOH: Positive Positive UDS? No Amphetamines: No Barbiturates: No Benzodiazepines: No Cocaine: No Opiates: No Cannabinoids: No  Demographic information: Gender: Male Ethnicity: White Marital Status: Single Insurance Status: Uninsured/Self-pay Ecologist (Work Neurosurgeon, Physicist, medical, etc.: No Lives with: Partner/Spouse Living situation: House/Apartment  Reported Patient History: Patient reported health conditions: Depression, Asthma Patient aware of HIV and hepatitis status: No  In past year, has patient visited ED for any reason? No  Number of ED visits:    Reason(s) for visit:    In past year, has patient been hospitalized for any reason? No  Number of hospitalizations:    Reason(s) for hospitalization:    In past year, has patient been arrested? No  Number of arrests:    Reason(s) for arrest:    In past year, has patient been incarcerated? No  Number of incarcerations:    Reason(s) for incarceration:    In past year, has patient received medication-assisted treatment? No  In past year, patient received the following treatments: Residential treatment (non-hospital)  In past year, has patient received any harm reduction services? No  Did this include any of the following?    In past year, has patient received care from a mental health provider for diagnosis other than SUD? No  In past year, is this first time patient has overdosed? No  Number of past overdoses:    In past year, is this first time patient has been hospitalized for an overdose? No  Number of hospitalizations for overdose(s):    Is patient currently receiving treatment for a mental health  diagnosis? No  Patient reports experiencing difficulty participating in SUD treatment: No    Most important reason(s) for this difficulty?    Has patient received prior services for treatment? No  In past, patient has received services from following agencies:    Plan of Care:  Suggested follow up at these agencies/treatment centers: Other (comment)  Other information: - CPSS met with Pt an was able to complete series of question an was able to gain information to better assist Pt. Pt stated that he was fine an that he does not want any assistance at this time. Pt mentioned that he has information packages an referrals for detox facilities.      Jlen Edelman [place], [place]  08/19/2020 3:58 PM
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
## 2020-08-19 NOTE — Discharge Instructions (Addendum)
Return for any problem.  ?

## 2020-08-19 NOTE — ED Notes (Addendum)
Father is refusing to pick up patient. Patient stated he has money to pay for a taxi, given the number for [employer].

## 2020-09-05 ENCOUNTER — Other Ambulatory Visit: Payer: Self-pay

## 2020-09-05 ENCOUNTER — Emergency Department (HOSPITAL_COMMUNITY)
Admission: EM | Admit: 2020-09-05 | Discharge: 2020-09-06 | Disposition: A | Discharge status: Home / Self Care | Payer: Self-pay | Attending: Emergency Medicine | Admitting: Emergency Medicine

## 2020-09-05 DIAGNOSIS — F10929 Alcohol use, unspecified with intoxication, unspecified: Secondary | ICD-10-CM | POA: Diagnosis present

## 2020-09-05 DIAGNOSIS — F419 Anxiety disorder, unspecified: Secondary | ICD-10-CM | POA: Insufficient documentation

## 2020-09-05 DIAGNOSIS — Z20822 Contact with and (suspected) exposure to covid-19: Secondary | ICD-10-CM | POA: Insufficient documentation

## 2020-09-05 DIAGNOSIS — R251 Tremor, unspecified: Secondary | ICD-10-CM | POA: Insufficient documentation

## 2020-09-05 DIAGNOSIS — F10939 Alcohol use, unspecified with withdrawal, unspecified: Secondary | ICD-10-CM | POA: Diagnosis present

## 2020-09-05 DIAGNOSIS — F332 Major depressive disorder, recurrent severe without psychotic features: Secondary | ICD-10-CM | POA: Insufficient documentation

## 2020-09-05 DIAGNOSIS — Z79899 Other long term (current) drug therapy: Secondary | ICD-10-CM | POA: Insufficient documentation

## 2020-09-05 DIAGNOSIS — F411 Generalized anxiety disorder: Secondary | ICD-10-CM | POA: Insufficient documentation

## 2020-09-05 DIAGNOSIS — F1010 Alcohol abuse, uncomplicated: Secondary | ICD-10-CM | POA: Insufficient documentation

## 2020-09-05 DIAGNOSIS — R45851 Suicidal ideations: Secondary | ICD-10-CM | POA: Insufficient documentation

## 2020-09-05 DIAGNOSIS — F1020 Alcohol dependence, uncomplicated: Secondary | ICD-10-CM | POA: Diagnosis present

## 2020-09-05 DIAGNOSIS — R45850 Homicidal ideations: Secondary | ICD-10-CM | POA: Insufficient documentation

## 2020-09-05 DIAGNOSIS — F17210 Nicotine dependence, cigarettes, uncomplicated: Secondary | ICD-10-CM | POA: Insufficient documentation

## 2020-09-05 LAB — CBC
HCT: 43.2 % (ref 39.0–52.0)
Hemoglobin: 15.2 g/dL (ref 13.0–17.0)
MCH: 34.9 pg — ABNORMAL HIGH (ref 26.0–34.0)
MCHC: 35.2 g/dL (ref 30.0–36.0)
MCV: 99.1 fL (ref 80.0–100.0)
Platelets: 267 10*3/uL (ref 150–400)
RBC: 4.36 MIL/uL (ref 4.22–5.81)
RDW: 11.5 % (ref 11.5–15.5)
WBC: 4.3 10*3/uL (ref 4.0–10.5)
nRBC: 0 % (ref 0.0–0.2)

## 2020-09-05 LAB — COMPREHENSIVE METABOLIC PANEL
ALT: 20 U/L (ref 0–44)
AST: 32 U/L (ref 15–41)
Albumin: 5 g/dL (ref 3.5–5.0)
Alkaline Phosphatase: 51 U/L (ref 38–126)
Anion gap: 19 — ABNORMAL HIGH (ref 5–15)
BUN: 8 mg/dL (ref 6–20)
CO2: 22 mmol/L (ref 22–32)
Calcium: 9.3 mg/dL (ref 8.9–10.3)
Chloride: 100 mmol/L (ref 98–111)
Creatinine, Ser: 0.74 mg/dL (ref 0.61–1.24)
GFR calc Af Amer: >60 mL/min (ref >60)
GFR calc non Af Amer: >60 mL/min (ref >60)
Glucose, Bld: 93 mg/dL (ref 70–99)
Potassium: 3.7 mmol/L (ref 3.5–5.1)
Sodium: 141 mmol/L (ref 135–145)
Total Bilirubin: 0.8 mg/dL (ref 0.3–1.2)
Total Protein: 8.3 g/dL — ABNORMAL HIGH (ref 6.5–8.1)

## 2020-09-05 LAB — ETHANOL: Alcohol, Ethyl (B): 148 mg/dL — ABNORMAL HIGH (ref <10)

## 2020-09-05 LAB — SARS CORONAVIRUS 2 BY RT PCR (HOSPITAL ORDER, PERFORMED IN ~~LOC~~ HOSPITAL LAB): SARS Coronavirus 2: NEGATIVE

## 2020-09-05 MED ORDER — HYDROXYZINE HCL 25 MG PO TABS
25.0000 mg | ORAL_TABLET | Freq: Four times a day (QID) | ORAL | Status: DC | PRN
  Administered 2020-09-06: 25 mg via ORAL
  Filled 2020-09-05: qty 1

## 2020-09-05 MED ORDER — THIAMINE HCL 100 MG/ML IJ SOLN
100.0000 mg | Freq: Once | INTRAMUSCULAR | Status: AC
  Administered 2020-09-05: 100 mg via INTRAMUSCULAR
  Filled 2020-09-05: qty 2

## 2020-09-05 MED ORDER — ONDANSETRON 4 MG PO TBDP: 4.0000 mg | ORAL_TABLET | Freq: Four times a day (QID) | ORAL | Status: DC | PRN

## 2020-09-05 MED ORDER — LORAZEPAM 1 MG PO TABS
1.0000 mg | ORAL_TABLET | Freq: Once | ORAL | Status: AC
  Administered 2020-09-05: 1 mg via ORAL
  Filled 2020-09-05: qty 1

## 2020-09-05 MED ORDER — THIAMINE HCL 100 MG PO TABS
100.0000 mg | ORAL_TABLET | Freq: Every day | ORAL | Status: DC
  Administered 2020-09-06: 100 mg via ORAL
  Filled 2020-09-05: qty 1

## 2020-09-05 MED ORDER — ADULT MULTIVITAMIN W/MINERALS CH
1.0000 | ORAL_TABLET | Freq: Every day | ORAL | Status: DC
  Administered 2020-09-05 – 2020-09-06 (×2): 1 via ORAL
  Filled 2020-09-05 (×2): qty 1

## 2020-09-05 MED ORDER — LOPERAMIDE HCL 2 MG PO CAPS: 2.0000 mg | ORAL_CAPSULE | ORAL | Status: DC | PRN

## 2020-09-05 MED ORDER — CHLORDIAZEPOXIDE HCL 25 MG PO CAPS
25.0000 mg | ORAL_CAPSULE | Freq: Four times a day (QID) | ORAL | Status: DC | PRN
  Filled 2020-09-05: qty 1

## 2020-09-05 NOTE — ED Provider Notes (Signed)
[facility]-EMERGENCY DEPT Provider Note   CSN: [id number] Arrival date & time: 09/05/20  1111     History Chief Complaint  Patient presents with  . Alcohol Intoxication  . Suicidal    John Vaughan is a 29 y.o. male.  HPI Patient presents for alcohol abuse and suicidal thoughts.  States that he went to family services and was sent here.  States that he drinks about 8 beers a day.  States he gets suicidal when he drinks.  States that he has plans to cut his wrist.  Denies hallucinations.  States he is withdrawing because he has not drank since 1 in the morning.  States he has had somewhat severe withdrawal in the past.  States he has a history of cirrhosis.  Denies current suicide attempt.  Denies other drug use. Has had previous alcohol withdrawal seizures.  Including admission at the end of July to [facility] for the same.    Past Medical History:  Diagnosis Date  . Alcohol abuse   . Anxiety   . Depression     Patient Active Problem List   Diagnosis Date Noted  . 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 Homicidal ideation   . GAD (generalized anxiety disorder) 03/09/2019  . Alcohol use disorder, severe, dependence (HCC) 03/09/2019  . Mild episode of recurrent major depressive disorder (HCC) 03/09/2019    No past surgical history on file.     Family History  Problem Relation Age of Onset  . Anxiety disorder Mother   . Alcohol abuse Father   . Anxiety disorder Maternal Aunt   . Anxiety disorder Maternal Grandmother   . Alcohol abuse Paternal Grandfather     Social History   Tobacco Use  . Smoking status: Current Every Day Smoker    Packs/day: 1.00    Types: Cigarettes  . Smokeless tobacco: Former Neurosurgeon    Types: Snuff  Vaping Use  . Vaping Use: Never used  Substance Use Topics  . Alcohol use: Yes    Alcohol/week: 18.0 standard drinks    Types: 18 Cans of beer per week    Comment:  states that he drinks 2-4 beers daily  . Drug use: Not Currently    Types: Marijuana    Home Medications Prior to Admission medications   Medication Sig Start Date End Date Taking? Authorizing Provider  chlordiazePOXIDE (LIBRIUM) 25 MG capsule 50mg  PO TID x 1D, then 25-50mg  PO BID X 1D, then 25-50mg  PO QD X 1D 05/27/20   07/27/20, PA-C  clonazePAM (KLONOPIN) 0.5 MG tablet Take 1 tablet (0.5 mg total) by mouth 2 (two) times daily as needed for anxiety. Patient taking differently: Take 1 mg by mouth daily.  04/22/20   Henderly, Britni A, PA-C  gabapentin (NEURONTIN) 300 MG capsule Take 900 mg by mouth 3 (three) times daily.  04/14/20   [provider]  Multiple Vitamins-Minerals (MULTIVITAMIN ADULTS PO) Take 1 tablet by mouth daily.    [provider]  ondansetron (ZOFRAN) 4 MG tablet Take 1 tablet (4 mg total) by mouth every 6 (six) hours. Patient not taking: Reported on 06/16/2020 05/27/20   07/27/20, PA-C  sertraline (ZOLOFT) 50 MG tablet Take 50 mg by mouth daily. 06/09/20   [provider]    Allergies    Lorazepam  Review of Systems   Review of Systems  Constitutional: Negative for appetite change.  HENT: Negative for congestion.   Respiratory:  Negative for shortness of breath.   Cardiovascular: Negative for chest pain.  Gastrointestinal: Negative for abdominal pain.  Musculoskeletal: Negative for back pain.  Skin: Negative for pallor.  Neurological: Positive for tremors.  Psychiatric/Behavioral: Positive for suicidal ideas.    Physical Exam Updated Vital Signs BP (!) 142/92   Pulse (!) 105   Temp 99.3 F (37.4 C) (Oral)   Resp 17   Ht 6' (1.829 m)   Wt 79.4 kg   SpO2 100%   BMI 23.73 kg/m   Physical Exam Vitals and nursing note reviewed.  HENT:     Head: Normocephalic.  Eyes:     Pupils: Pupils are equal, round, and reactive to light.  Cardiovascular:     Rate and Rhythm: Tachycardia present.     Comments: Mild  tachycardia Pulmonary:     Breath sounds: No wheezing or rhonchi.  Abdominal:     Tenderness: There is abdominal tenderness.     Comments: Mild right upper quadrant tenderness without rebound or guarding.  Musculoskeletal:        General: No tenderness.     Cervical back: Neck supple.  Skin:    General: Skin is warm.  Neurological:     Mental Status: He is alert.     Comments: Mild tremor in hands.  Psychiatric:        Mood and Affect: Mood normal.     ED Results / Procedures / Treatments   Labs (all labs ordered are listed, but only abnormal results are displayed) Labs Reviewed  COMPREHENSIVE METABOLIC PANEL - Abnormal; Notable for the following components:      Result Value   Total Protein 8.3 (*)    Anion gap 19 (*)    All other components within normal limits  ETHANOL - Abnormal; Notable for the following components:   Alcohol, Ethyl (B) 148 (*)    All other components within normal limits  CBC - Abnormal; Notable for the following components:   MCH 34.9 (*)    All other components within normal limits  SARS CORONAVIRUS 2 BY RT PCR (HOSPITAL ORDER, PERFORMED IN [facility])  RAPID URINE DRUG SCREEN, HOSP PERFORMED    EKG None  Radiology No results found.  Procedures Procedures (including critical care time)  Medications Ordered in ED Medications  thiamine (B-1) injection 100 mg (has no administration in time range)  thiamine tablet 100 mg (has no administration in time range)  multivitamin with minerals tablet 1 tablet (has no administration in time range)  chlordiazePOXIDE (LIBRIUM) capsule 25 mg (has no administration in time range)  hydrOXYzine (ATARAX/VISTARIL) tablet 25 mg (has no administration in time range)  loperamide (IMODIUM) capsule 2-4 mg (has no administration in time range)  ondansetron (ZOFRAN-ODT) disintegrating tablet 4 mg (has no administration in time range)    ED Course  I have reviewed the triage vital signs and the  nursing notes.  Pertinent labs & imaging results that were available during my care of the patient were reviewed by me and considered in my medical decision making (see chart for details).    MDM Rules/Calculators/A&P                          Patient presents with alcohol abuse and reported suicidal thoughts.  Reportedly said he wanted to cut his wrists.  Alcohol level around 150.  Mild tremors.  Lab work reassuring.  Covid test negative.  Will have patient seen by  TTS.  Patient is medically cleared.  Started on Librium detox protocol since has reported allergy to lorazepam. Final Clinical Impression(s) / ED Diagnoses Final diagnoses:  Alcohol abuse  Suicidal ideation    Rx / DC Orders ED Discharge Orders    None       Benjiman Core, MD 09/05/20 1505

## 2020-09-05 NOTE — ED Notes (Signed)
PT BELONGINGS LOCATED IN [facility] [address]  

## 2020-09-05 NOTE — ED Triage Notes (Addendum)
Pt detoxing ETOH and the [facility] [place] notified EMS for [employer], "wanting to cut wrists". Pt wants to detox and requested help. Off of seizure meds for 2 months last seizure was one month ago.

## 2020-09-05 NOTE — BH Assessment (Signed)
Comprehensive Clinical Assessment (CCA) Note  09/05/2020 [name] [id number]  John Vaughan is a 29 year old male who presents voluntary and unaccompanied to [facility]. Clinician asked the pt, "what brought you to the hospital?" Pt reported, his depression started last Thursday, he is not sure of what triggered his depression. Pt reported, today, he began having passive suicidal thoughts with no plan. Pt reported, "feeling better off getting out of situation." Pt reported, his stressors are around his house, "people doing things they shouldn't be doing." Pt reported, having panic attacks daily. Pt reported, he called the sheriff because he didn't know what to do, his parents were asleep. Pt reported, he was feeling depressed, suicidal and sick so he called the sheriff. Clinician reviewed the pt's EDP note and discussed discrepancies. Pt then reported, he went to [facility] the [place] so he can get help with his medications, he was suicidal and detoxing from alcohol. Pt reported, EMS was called and he was taken to ED. Pt reported, he is not currently suicidal. Pt reported, he never told the EDP that he wanted to he has plans to cut his wrist, they may have seen his knife. Pt reported, access to two guns. Pt experiencing auditory and visual hallucinations only when he does not drink. Pt denies, HI, AVH, and self-injurious behaviors.   Pt reported, drank a 40 oz beer, at 1am this morning. Pt's BAL was 148 at 0000. Pt reported, taking one hit of marijuana last Friday. Pt's UDS is negative. Pt denies, being linked to OPT resources (medication management and/or counseling.) Pt reported, previous inpatient admissions.   Pt presents alert in scrubs with clear and coherent speech. Pt's eye contact was normal. Pt's mood was pleasant, sad. Pt's affect was congruent with mood. Pt's insight was poor. Pt's judgement was fair. Pt was oriented x5. Pt reported, if discharged from [facility] he could not  contract for safety because he feels he will have a seizure and need medical attention. Clinician discussed the three possible dispositions (discharged with OPT resources, observe/reassess by psychiatry or inpatient treatment) in detail.  Disposition: Nira Conn, NP recommends overnight observation/ reassess by psychiatry and for pt to stay in ED due to withdrawal seizures and DTs. Disposition discussed with Radio producer. Charge Nurse to inform EDP of disposition.   Diagnosis: Major Depressive Disorder, recurrent, severe without psychotic features.                     Alcohol use Disorder, severe dependence.   *Pt declined for clinician to contact family, friend supports to gather additional information.*  Visit Diagnosis:      ICD-10-CM   1. Alcohol abuse  F10.10   2. Suicidal ideation  R45.851       CCA Screening, Triage and Referral (STR)  Patient Reported Information How did you hear about [place]? No data recorded Referral name: No data recorded Referral phone number: No data recorded  Whom do you see for routine medical problems? No data recorded Practice/Facility Name: No data recorded Practice/Facility Phone Number: No data recorded Name of Contact: No data recorded Contact Number: No data recorded Contact Fax Number: No data recorded Prescriber Name: No data recorded Prescriber Address (if known): No data recorded  What Is the Reason for Your Visit/Call Today? No data recorded How Long Has This Been Causing You Problems? No data recorded What Do You Feel Would Help You the Most Today? No data recorded  Have You Recently Been  in Any Inpatient Treatment (Hospital/Detox/Crisis Center/28-Day Program)? No data recorded Name/Location of Program/Hospital:No data recorded How Long Were You There? No data recorded When Were You Discharged? No data recorded  Have You Ever Received Services From [facility] Before? No data recorded Who Do You See at [facility]? No data  recorded  Have You Recently Had Any Thoughts About Hurting Yourself? No data recorded Are You Planning to Commit Suicide/Harm Yourself At This time? No data recorded  Have you Recently Had Thoughts About Hurting Someone [name]? No data recorded Explanation: No data recorded  Have You Used Any Alcohol or Drugs in the Past 24 Hours? No data recorded How Long Ago Did You Use Drugs or Alcohol? No data recorded What Did You Use and How Much? No data recorded  Do You Currently Have a Therapist/Psychiatrist? No data recorded Name of Therapist/Psychiatrist: No data recorded  Have You Been Recently Discharged From Any Office Practice or Programs? No data recorded Explanation of Discharge From Practice/Program: No data recorded    CCA Screening Triage Referral Assessment Type of Contact: No data recorded Is this Initial or Reassessment? No data recorded Date Telepsych consult ordered in [facility]:  08/18/20  Time Telepsych consult ordered in [facility]:  1836   Patient Reported Information Reviewed? No data recorded Patient Left Without Being Seen? No data recorded Reason for Not Completing Assessment: No data recorded  Collateral Involvement: No data recorded  Does Patient Have a Court Appointed Legal Guardian? No data recorded Name and Contact of Legal Guardian: Self.   If Minor and Not Living with Parent(s), Who has Custody? No data recorded Is CPS involved or ever been involved? No data recorded Is APS involved or ever been involved? No data recorded  Patient Determined To Be At Risk for Harm To Self or Others Based on Review of Patient Reported Information or Presenting Complaint? No data recorded Method: No data recorded Availability of Means: No data recorded Intent: No data recorded Notification Required: No data recorded Additional Information for Danger to Others Potential: No data recorded Additional Comments for Danger to Others Potential: No data recorded Are There Guns or Other  Weapons in Your Home? No data recorded Types of Guns/Weapons: No data recorded Are These Weapons Safely Secured?                            No data recorded Who Could Verify You Are Able To Have These Secured: No data recorded Do You Have any Outstanding Charges, Pending Court Dates, Parole/Probation? No data recorded Contacted To Inform of Risk of Harm To Self or Others: No data recorded  [facility]: [facility]   Does Patient Present under Involuntary Commitment? No data recorded [name] Papers Initial File Date: No data recorded  [place] of Residence: No data recorded  Patient Currently Receiving the Following Services: No data recorded  Determination of Need: No data recorded  Options For Referral: No data recorded   CCA Biopsychosocial  Intake/Chief Complaint:  CCA Intake With Chief Complaint CCA Part Two Date: 09/05/20 CCA Part Two Time: 2007 Chief Complaint/Presenting Problem: Per EDP note: "Patient presents for alcohol abuse and suicidal thoughts. States that he went to family services and was sent here. States that he drinks about 8 beers a day. States he gets suicidal when he drinks. States that he has plans to cut his wrist. Denies hallucinations. States he is withdrawing because he has not drank since 1 in  the morning. States he has had somewhat severe withdrawal in the past. States he has a history of cirrhosis. Denies current suicide attempt.  Denies other drug use. Has had previous alcohol withdrawal seizures. Including admission at the end of July to [facility] for the same." Patient's Currently Reported Symptoms/Problems: Depression, anxiety and suicidal thoughts. Individual's Strengths: Pt reported, he has a place to stay. Type of Services Patient Feels Are Needed: Pt reported, only medical attention. Pt reported, he is going through alcohol withdrawal and thinks he may have a seizure. Initial Clinical Notes/Concerns: Pt was assessed by clinician on  08/19/2020.  Mental Health Symptoms Depression:  Depression: Difficulty Concentrating, Fatigue, Hopelessness, Irritability, Worthlessness, Weight gain/loss, Tearfulness, Sleep (too much or little)  Mania:  Mania: None  Anxiety:   Anxiety: Difficulty concentrating, Fatigue, Irritability, Worrying (Pt reported, having panic attacks, daily.)  Psychosis:  Psychosis: None  Trauma:  Trauma: None  Obsessions:  Obsessions: None  Compulsions:  Compulsions: None  Inattention:  Inattention: None  Hyperactivity/Impulsivity:  Hyperactivity/Impulsivity: N/A  Oppositional/Defiant Behaviors:  Oppositional/Defiant Behaviors: None  Emotional Irregularity:  Emotional Irregularity: None  Other Mood/Personality Symptoms:      Mental Status Exam Appearance and self-care  Stature:     Weight:     Clothing:  Clothing:  (Pt in scrubs.)  Grooming:  Grooming: Normal (Pt in scrubs.)  Cosmetic use:  Cosmetic Use: None  Posture/gait:  Posture/Gait: Normal  Motor activity:  Motor Activity: Not Remarkable  Sensorium  Attention:  Attention: Normal  Concentration:  Concentration: Normal  Orientation:  Orientation: X5  Recall/memory:  Recall/Memory: Defective in Recent  Affect and Mood  Affect:  Affect: Other (Comment) (congruent with mood.)  Mood:  Mood: Other (Comment) (pleasant, sad.)  Relating  Eye contact:  Eye Contact: Normal  Facial expression:  Facial Expression: Responsive  Attitude toward examiner:  Attitude Toward Examiner: Cooperative  Thought and Language  Speech flow: Speech Flow: Clear and Coherent  Thought content:  Thought Content: Appropriate to Mood and Circumstances  Preoccupation:  Preoccupations: None  Hallucinations:  Hallucinations: None  Organization:     Company secretary of Knowledge:  Fund of Knowledge: Fair  Intelligence:  Intelligence: Average  Abstraction:  Abstraction:  Industrial/product designer)  Judgement:  Judgement: Fair  Reality Testing:  Reality Testing:  (UTA)  Insight:   Insight: Poor  Decision Making:  Decision Making: Impulsive  Social Functioning  Social Maturity:  Social Maturity:  Industrial/product designer)  Social Judgement:  Social Judgement:  (UTA)  Stress  Stressors:  Stressors: Other (Comment) (Pt reported, people doing things they shouldn't be doing.)  Coping Ability:  Coping Ability: Overwhelmed  Skill Deficits:  Skill Deficits: Self-control  Supports:  Supports: Other (Comment) (Pt reported, having supports.)     Religion: Religion/Spirituality Are You A Religious Person?: Yes What is Your Religious Affiliation?:  ([employer]  Leisure/Recreation: Leisure / Recreation Do You Have Hobbies?: Yes Leisure and Hobbies: Pt reported, working on cars.  Exercise/Diet: Exercise/Diet Do You Exercise?: Yes What Type of Exercise Do You Do?: Run/Walk (Pt reported, he walks a mile, daily.) How Many Times a Week Do You Exercise?: Daily Have You Gained or Lost A Significant Amount of Weight in the Past Six Months?: Yes-Lost Number of Pounds Lost?: 20 Do You Follow a Special Diet?: No Do You Have Any Trouble Sleeping?: Yes Explanation of Sleeping Difficulties: Pt reported, only sleeping four hours per night.   CCA Employment/Education  Employment/Work Situation: Employment / Work Situation Employment situation: Unemployed Has patient  ever been in the [employer]?: No  Education: Education Is Patient Currently Attending School?: No Last Grade Completed: 12 Name of High School: [employer]. Did You Graduate From [employer]?: Yes Did You Attend College?: No Did You Attend Graduate School?: No   CCA Family/Childhood History  Family and Relationship History: Family history Marital status: Single Does patient have children?: Yes How many children?: 1  Childhood History:  Childhood History Additional childhood history information: NA Description of patient's relationship with caregiver when they were a child: NA Patient's  description of current relationship with people who raised him/her: NA How were you disciplined when you got in trouble as a child/adolescent?: NA Did patient suffer any verbal/emotional/physical/sexual abuse as a child?: No (Pt denies.) Did patient suffer from severe childhood neglect?: No (Pt denies.) Has patient ever been sexually abused/assaulted/raped as an adolescent or adult?: No (Pt denies.) Was the patient ever a victim of a crime or a disaster?: No (Pt denies.) Witnessed domestic violence?: No (Pt denies.) Has patient been affected by domestic violence as an adult?: No (Pt denies.)  Child/Adolescent Assessment:     CCA Substance Use  Alcohol/Drug Use: Alcohol / Drug Use Pain Medications: See MAR Prescriptions: See MAR Over the Counter: See MAR History of alcohol / drug use?: Yes Withdrawal Symptoms: Nausea / Vomiting, Tingling, Sweats (Pt reported, shakes.) Date of most recent seizure: Pt reported, he has a bad alcohol related seizure a month ago. Pt also reports, he has had a few alcohol related siezures since then. Substance #1 Name of Substance 1: Alcohol 1 - Age of First Use: UTA 1 - Amount (size/oz): Pt reported, drank a 40 oz beer, at 1am this morning. Pt's BAL was 148 at 0000. 1 - Frequency: Daily. 1 - Duration: Ongoing. 1 - Last Use / Amount: Per pt, 1 am this morning. Substance #2 Name of Substance 2: Marijuana. 2 - Age of First Use: UTA 2 - Amount (size/oz): Pt reported, taking one hit last Friday. Pt's UDS is negative. 2 - Frequency: Pt reported, smoking a joint every now and then. 2 - Duration: Ongoing. 2 - Last Use / Amount: Per pt, last Friday.    ASAM's:  Six Dimensions of Multidimensional Assessment  Dimension 1:  Acute Intoxication and/or Withdrawal Potential:      Dimension 2:  Biomedical Conditions and Complications:      Dimension 3:  Emotional, Behavioral, or Cognitive Conditions and Complications:     Dimension 4:  Readiness to Change:      Dimension 5:  Relapse, Continued use, or Continued Problem Potential:     Dimension 6:  Recovery/Living Environment:     ASAM Severity Score:    ASAM Recommended Level of Treatment:     Substance use Disorder (SUD)    Recommendations for Services/Supports/Treatments: Recommendations for Services/Supports/Treatments Recommendations For Services/Supports/Treatments: Other (Comment) (overnight observation/ reassess by psychiatry and for pt to stay in ED due to withdrawal seizures and DTs.)  DSM5 Diagnoses: Patient Active Problem List   Diagnosis Date Noted  . 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 Homicidal ideation   . GAD (generalized anxiety disorder) 03/09/2019  . Alcohol use disorder, severe, dependence (HCC) 03/09/2019  . Mild episode of recurrent major depressive disorder (HCC) 03/09/2019    Referrals to Alternative Service(s): Referred to Alternative Service(s):   Place:   Date:   Time:    Referred to Alternative Service(s):   Place:   Date:   Time:  Referred to Alternative Service(s):   Place:   Date:   Time:    Referred to Alternative Service(s):   Place:   Date:   Time:     Redmond Pullingreylese D Jadakiss Barish  Comprehensive Clinical Assessment (CCA) Screening, Triage and Referral Note  09/05/2020 Thomes DinningAaron Bradley Kisling [id number]  Visit Diagnosis:    ICD-10-CM   1. Alcohol abuse  F10.10   2. Suicidal ideation  R45.851     Patient Reported Information How did you hear about us? No data recorded  Referral name: No data recorded  Referral phone number: No data recorded Whom do you see for routine medical problems? No data recorded  Practice/Facility Name: No data recorded  Practice/Facility Phone Number: No data recorded  Name of Contact: No data recorded  Contact Number: No data recorded  Contact Fax Number: No data recorded  Prescriber Name: No data recorded  Prescriber Address (if known): No data recorded What Is the Reason  for Your Visit/Call Today? No data recorded How Long Has This Been Causing You Problems? No data recorded Have You Recently Been in Any Inpatient Treatment (Hospital/Detox/Crisis Center/28-Day Program)? No data recorded  Name/Location of Program/Hospital:No data recorded  How Long Were You There? No data recorded  When Were You Discharged? No data recorded [facility] You [facility]? No data recorded  Who [facility] You [facility]? No data recorded Have You Recently Had Any Thoughts About Hurting Yourself? No data recorded  Are You Planning to Commit Suicide/Harm Yourself At This time?  No data recorded Have you Recently Had Thoughts About Hurting Someone Karolee Ohslse? No data recorded  Explanation: No data recorded Have You Used Any Alcohol or Drugs in the Past 24 Hours? No data recorded  How Long Ago Did You Use Drugs or Alcohol?  No data recorded  What Did You Use and How Much? No data recorded What Do You Feel Would Help You the Most Today? No data recorded Do You Currently Have a Therapist/Psychiatrist? No data recorded  Name of Therapist/Psychiatrist: No data recorded  Have You Been Recently Discharged From Any Office Practice or Programs? No data recorded  Explanation of Discharge From Practice/Program:  No data recorded    CCA Screening Triage Referral Assessment Type of Contact: No data recorded  Is this Initial or Reassessment? No data recorded  Date Telepsych consult ordered in [facility]:  08/18/20   [facility]  Patient Reported Information Reviewed? No data recorded  Patient Left Without Being Seen? No data recorded  Reason for Not Completing Assessment: No data recorded Collateral Involvement: No data recorded Does Patient Have a Court Appointed Legal Guardian? No data recorded  Name and Contact of Legal Guardian:  Self.   If Minor and Not Living with Parent(s), Who has Custody? No data recorded Is CPS involved or  ever been involved? No data recorded Is APS involved or ever been involved? No data recorded Patient Determined To Be At Risk for Harm To Self or Others Based on Review of Patient Reported Information or Presenting Complaint? No data recorded  Method: No data recorded  Availability of Means: No data recorded  Intent: No data recorded  Notification Required: No data recorded  Additional Information for Danger to Others Potential:  No data recorded  Additional Comments for Danger to Others Potential:  No data recorded  Are There Guns or Other Weapons in Your Home?  No data recorded   Types of  Guns/Weapons: No data recorded   Are These Weapons Safely Secured?                              No data recorded   Who Could Verify You Are Able To Have These Secured:    No data recorded Do You Have any Outstanding Charges, Pending Court Dates, Parole/Probation? No data recorded Contacted To Inform of Risk of Harm To Self or Others: No data recorded [facility] of [facility]: [facility]  Does Patient Present under Involuntary Commitment? No data recorded  [name] Papers Initial File Date: No data recorded  [place] of Residence: No data recorded Patient Currently Receiving the Following Services: No data recorded  Determination of Need: No data recorded  Options For Referral: No data recorded  [name], [place] [facility]    [name], MS, [facility], [facility] Triage Specialist [phone]

## 2020-09-05 NOTE — BHH Counselor (Signed)
Clinician spoke to [facility], Press photographer to express she will call the TTS machine in 10 minutes to complete pt's assessment.    [name], MS, [facility], [place], [facility] Triage Specialist [phone]

## 2020-09-06 ENCOUNTER — Encounter (HOSPITAL_COMMUNITY): Payer: Self-pay | Admitting: Registered Nurse

## 2020-09-06 DIAGNOSIS — F10939 Alcohol use, unspecified with withdrawal, unspecified: Secondary | ICD-10-CM | POA: Diagnosis present

## 2020-09-06 DIAGNOSIS — F1094 Alcohol use, unspecified with alcohol-induced mood disorder: Secondary | ICD-10-CM

## 2020-09-06 DIAGNOSIS — F411 Generalized anxiety disorder: Secondary | ICD-10-CM

## 2020-09-06 LAB — RAPID URINE DRUG SCREEN, HOSP PERFORMED
Amphetamines: NOT DETECTED
Barbiturates: NOT DETECTED
Benzodiazepines: POSITIVE — AB
Cocaine: NOT DETECTED
Opiates: NOT DETECTED
Tetrahydrocannabinol: NOT DETECTED

## 2020-09-06 MED ORDER — CHLORDIAZEPOXIDE HCL 25 MG PO CAPS
50.0000 mg | ORAL_CAPSULE | Freq: Once | ORAL | Status: AC
  Administered 2020-09-06: 50 mg via ORAL
  Filled 2020-09-06: qty 2

## 2020-09-06 NOTE — Discharge Instructions (Signed)
To help you maintain a sober lifestyle, a substance abuse treatment program may be beneficial to you.  Contact one of the following facilities at your earliest opportunity to ask about enrolling: ° °RESIDENTIAL PROGRAMS: ° °     [facility] °     [address] °     [place], [place] [address] °     [phone] ° °     [facility] °     [address] °     [place], [place] [address] °     [phone] ° °     [facility] °     [address] °     [place], [place] [address] °     [phone] ° °     [facility] °     [address] °     [place], [place] [address] °     [phone] ° °     [facility] °     [address] °     [place], [place] [address] °     [phone] ° °OUTPATIENT PROGRAMS: ° °     [facility] °     [address] °     [place], [place] [address] °     [phone] °     Ask about their Substance Abuse Intensive Outpatient Program.  They also offer psychiatry/medication management and therapy.  New patients are being seen in their walk-in clinic.  Walk-in hours are Monday - Thursday from 8:00 am - 11:00 am for psychiatry, and Friday from 1:00 pm - 4:00 pm for therapy.  Walk-in patients are seen on a first come, first served basis, so try to arrive as early as possible for the best chance of being seen the same day. ° °

## 2020-09-06 NOTE — ED Notes (Signed)
Pt DCd off unit to home per provider. Pt alert, calm, cooperative, no s/s of distress. DC information given to and reviewed with pt. Belongings accidentally taken to [facility], pt was aware and going to pick up at [facility]. Pt ambulatory off unit, escorted off unit by NT. Pt transported by family member.

## 2020-09-06 NOTE — Consult Note (Addendum)
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 male patient presented to [facility] ED with complaints of worsening depression since last Thursday and requesting assistance with alcohol withdrawal.    HPI:  [name], 29 y.o., male patient seen via tele psych by this provider, Dr. Lucianne Muss; and chart reviewed on 09/06/20.  On evaluation John Vaughan reports he came to the hospital because "I was feeling really depressed because of going through alcohol withdrawal and my anxiety was going through the roof."  Patient states that he started feeling numbness and tingling in his lower extremities bilaterally and it scared him.  Patient states that he has a history of DT's and seizures with alcohol with drawl and is usually medically admitted and his last medical admit was at [facility] a month ago.  Patient is unemployed and states that he lives with his parents who are supportive "but not supportive of my drinking."  Patient states he drinks five 40 oz beers daily.  States that he has outpatient services with "[facility] who is helping me with my medicine.  They said they could help me but I have to get detox first."  During evaluation Waller Marcussen is alert/oriented x 4; calm/cooperative; and mood is congruent with affect.  He does not appear to be  responding to internal/external stimuli or delusional thoughts.  Patient denies suicidal/self-harm/homicidal ideation, psychosis, and paranoia.  Patient answered question appropriately. Patient has a significant chronic history of alcohol abuse and has been reluctant in the past to seek help with rehabilitation services.  At this time patient states that he is ready to change and is aware that after detox he will need rehab services.  Patient states he has made steps to get outpatient services and is prescribed medications through services provided at [facility].  Peer support consult was ordered to assist with rehab resources.  Will speak with Dr. Charm Barges [facility] EDP) related medical admission for alcohol withdrawal and patient being high risk.   Spoke with Dr. Charm Barges related to medical admission and Dr. Charm Barges informed that patients' CIWA currently at 6 right now and patient has now withdrawal symptoms at this time which does not warrant for a medical admission.  Informed Dr. Charm Barges that a Peer support consult had been ordered and maybe they could find a place that offered detox and rehab services.      Past Psychiatric History: Alcohol use disorder, major depressive disorder  Risk to Self:  No Risk to Others:  No Prior Inpatient Therapy:  Yes Prior Outpatient Therapy:  Yes  Past Medical History:  Past Medical History:  Diagnosis Date   Alcohol abuse    Anxiety    Depression    History reviewed. No pertinent surgical history. Family History:  Family History  Problem Relation Age of Onset   Anxiety disorder Mother    Alcohol abuse Father    Anxiety disorder Maternal Aunt  Anxiety disorder Maternal Grandmother    Alcohol abuse Paternal Grandfather    Family Psychiatric  History: See above Social History:  Social History   Substance and Sexual Activity  Alcohol Use Yes   Alcohol/week: 18.0 standard drinks   Types: 18 Cans of beer per week   Comment: states that he  drinks 2-4 beers daily     Social History   Substance and Sexual Activity  Drug Use Not Currently   Types: Marijuana    Social History   Socioeconomic History   Marital status: Single    Spouse name: Not on file   Number of children: Not on file   Years of education: Not on file   Highest education level: Not on file  Occupational History   Not on file  Tobacco Use   Smoking status: Current Every Day Smoker    Packs/day: 1.00    Types: Cigarettes   Smokeless tobacco: Former Neurosurgeon    Types: Snuff  Vaping Use   Vaping Use: Never used  Substance and Sexual Activity   Alcohol use: Yes    Alcohol/week: 18.0 standard drinks    Types: 18 Cans of beer per week    Comment: states that he drinks 2-4 beers daily   Drug use: Not Currently    Types: Marijuana   Sexual activity: Not Currently  Other Topics Concern   Not on file  Social History Narrative   Not on file   Social Determinants of Health   Financial Resource Strain:    Difficulty of Paying Living Expenses: Not on file  Food Insecurity:    Worried About Running Out of Food in the Last Year: Not on file   Ran Out of Food in the Last Year: Not on file  Transportation Needs:    Lack of Transportation (Medical): Not on file   Lack of Transportation (Non-Medical): Not on file  Physical Activity:    Days of Exercise per Week: Not on file   Minutes of Exercise per Session: Not on file  Stress:    Feeling of Stress : Not on file  Social Connections:    Frequency of Communication with Friends and Family: Not on file   Frequency of Social Gatherings with Friends and Family: Not on file   Attends Religious Services: Not on file   Active Member of Clubs or Organizations: Not on file   Attends Banker Meetings: Not on file   Marital Status: Not on file   Additional Social History:    Allergies:  No Known Allergies  Labs:  Results for orders placed or performed during the  hospital encounter of 09/05/20 (from the past 48 hour(s))  Comprehensive metabolic panel     Status: Abnormal   Collection Time: 09/05/20 12:00 PM  Result Value Ref Range   Sodium 141 135 - 145 mmol/L   Potassium 3.7 3.5 - 5.1 mmol/L   Chloride 100 98 - 111 mmol/L   CO2 22 22 - 32 mmol/L   Glucose, Bld 93 70 - 99 mg/dL    Comment: Glucose reference range applies only to samples taken after fasting for at least 8 hours.   BUN 8 6 - 20 mg/dL   Creatinine, Ser 0.03 0.61 - 1.24 mg/dL   Calcium 9.3 8.9 - 70.4 mg/dL   Total Protein 8.3 (H) 6.5 - 8.1 g/dL   Albumin 5.0 3.5 - 5.0 g/dL   AST 32 15 - 41 U/L   ALT 20 0 -  44 U/L   Alkaline Phosphatase 51 38 - 126 U/L   Total Bilirubin 0.8 0.3 - 1.2 mg/dL   GFR calc non Af Amer >60 >60 mL/min   GFR calc Af Amer >60 >60 mL/min   Anion gap 19 (H) 5 - 15    Comment: Performed at [facility], [address] [place], [place] [address]  Ethanol     Status: Abnormal   Collection Time: 09/05/20 12:00 PM  Result Value Ref Range   Alcohol, Ethyl (B) 148 (H) <10 mg/dL    Comment: (NOTE) Lowest detectable limit for serum alcohol is 10 mg/dL.  For medical purposes only. Performed at [facility], [address] [place], [place] [address]   CBC     Status: Abnormal   Collection Time: 09/05/20 12:00 PM  Result Value Ref Range   WBC 4.3 4.0 - 10.5 K/uL   RBC 4.36 4.22 - 5.81 MIL/uL   Hemoglobin 15.2 13.0 - 17.0 g/dL   HCT 36.6 39 - 52 %   MCV 99.1 80.0 - 100.0 fL   MCH 34.9 (H) 26.0 - 34.0 pg   MCHC 35.2 30.0 - 36.0 g/dL   RDW 44.0 34.7 - 42.5 %   Platelets 267 150 - 400 K/uL   nRBC 0.0 0.0 - 0.2 %    Comment: Performed at [facility], [address] [place], [place] [address]  SARS Coronavirus 2 by RT PCR (hospital order, performed in [facility] hospital lab) Nasopharyngeal Nasopharyngeal Swab     Status: None   Collection Time: 09/05/20  1:11 PM   Specimen: Nasopharyngeal Swab   Result Value Ref Range   SARS Coronavirus 2 NEGATIVE NEGATIVE    Comment: (NOTE) SARS-CoV-2 target nucleic acids are NOT DETECTED.  The SARS-CoV-2 RNA is generally detectable in upper and lower respiratory specimens during the acute phase of infection. The lowest concentration of SARS-CoV-2 viral copies this assay can detect is 250 copies / mL. A negative result does not preclude SARS-CoV-2 infection and should not be used as the sole basis for treatment or other patient management decisions.  A negative result may occur with improper specimen collection / handling, submission of specimen other than nasopharyngeal swab, presence of viral mutation(s) within the areas targeted by this assay, and inadequate number of viral copies (<250 copies / mL). A negative result must be combined with clinical observations, patient history, and epidemiological information.  Fact Sheet for Patients:   BoilerBrush.com.cy  Fact Sheet for Healthcare Providers: https://pope.com/  This test is not yet approved or  cleared by the [place] FDA and has been authorized for detection and/or diagnosis of SARS-CoV-2 by FDA under an Emergency Use Authorization (EUA).  This EUA will remain in effect (meaning this test can be used) for the duration of the COVID-19 declaration under Section 564(b)(1) of the Act, 21 U.S.C. section 360bbb-3(b)(1), unless the authorization is terminated or revoked sooner.  Performed at [facility], [address] [place], [place] [address]   Urine rapid drug screen (hosp performed)     Status: Abnormal   Collection Time: 09/06/20  4:21 AM  Result Value Ref Range   Opiates NONE DETECTED NONE DETECTED   Cocaine NONE DETECTED NONE DETECTED   Benzodiazepines POSITIVE (A) NONE DETECTED   Amphetamines NONE DETECTED NONE DETECTED   Tetrahydrocannabinol NONE DETECTED NONE DETECTED   Barbiturates NONE DETECTED  NONE DETECTED    Comment: (NOTE) DRUG SCREEN FOR MEDICAL PURPOSES  ONLY.  IF CONFIRMATION IS NEEDED FOR ANY PURPOSE, NOTIFY LAB WITHIN 5 DAYS.  LOWEST DETECTABLE LIMITS FOR URINE DRUG SCREEN Drug Class                     Cutoff (ng/mL) Amphetamine and metabolites    1000 Barbiturate and metabolites    200 Benzodiazepine                 200 Tricyclics and metabolites     300 Opiates and metabolites        300 Cocaine and metabolites        300 THC                            50 Performed at [facility], [address] [place], [place] [id number]     Medications:  Current Facility-Administered Medications  Medication Dose Route Frequency Provider Last Rate Last Admin   chlordiazePOXIDE (LIBRIUM) capsule 25 mg  25 mg Oral Q6H PRN Benjiman CorePickering, Nathan, MD       hydrOXYzine (ATARAX/VISTARIL) tablet 25 mg  25 mg Oral Q6H PRN Benjiman CorePickering, Nathan, MD   25 mg at 09/06/20 0930   loperamide (IMODIUM) capsule 2-4 mg  2-4 mg Oral PRN Benjiman CorePickering, Nathan, MD       multivitamin with minerals tablet 1 tablet  1 tablet Oral Daily Benjiman CorePickering, Nathan, MD   1 tablet at 09/06/20 0924   ondansetron (ZOFRAN-ODT) disintegrating tablet 4 mg  4 mg Oral Q6H PRN Benjiman CorePickering, Nathan, MD       thiamine tablet 100 mg  100 mg Oral Daily Benjiman CorePickering, Nathan, MD   100 mg at 09/06/20 [id number]   Current Outpatient Medications  Medication Sig Dispense Refill   clonazePAM (KLONOPIN) 0.5 MG tablet Take 1 tablet (0.5 mg total) by mouth 2 (two) times daily as needed for anxiety. (Patient taking differently: Take 1 mg by mouth daily. ) 15 tablet 0   gabapentin (NEURONTIN) 300 MG capsule Take 900 mg by mouth 3 (three) times daily.      Multiple Vitamins-Minerals (MULTIVITAMIN ADULTS PO) Take 1 tablet by mouth daily.     sertraline (ZOLOFT) 50 MG tablet Take 50 mg by mouth daily.     chlordiazePOXIDE (LIBRIUM) 25 MG capsule 50mg  PO TID x 1D, then 25-50mg  PO BID X 1D, then 25-50mg  PO QD X 1D 10 capsule 0    ondansetron (ZOFRAN) 4 MG tablet Take 1 tablet (4 mg total) by mouth every 6 (six) hours. (Patient not taking: Reported on 06/16/2020) 12 tablet 0    Musculoskeletal: Strength & Muscle Tone: within normal limits Gait & Station: normal Patient leans: N/A  Psychiatric Specialty Exam: Physical Exam Vitals and nursing note reviewed. Exam conducted with a chaperone present.  Pulmonary:     Effort: Pulmonary effort is normal.  Musculoskeletal:        General: Normal range of motion.  Neurological:     Mental Status: He is alert.  Psychiatric:        Attention and Perception: Attention and perception normal.        Mood and Affect: Affect normal. Mood is anxious.        Speech: Speech normal.        Behavior: Behavior normal. Behavior is cooperative.        Thought Content: Thought content normal. Thought content is not paranoid or delusional. Thought content does not include homicidal or suicidal ideation.  Cognition and Memory: Cognition and memory normal.        Judgment: Judgment is impulsive.     Review of Systems  Constitutional: Positive for diaphoresis.  Neurological: Seizures: Reports a history of seizures with withdrawal.       Complaints of numbness and tingling in lower ext bilaterally that started yesterday.    Psychiatric/Behavioral: Positive for sleep disturbance. Negative for agitation, behavioral problems, confusion, hallucinations and suicidal ideas. The patient is nervous/anxious.        Reporting "scared of going through withdrawal.  I know can't do at home."  Patient reports a history of DT's and has been medically admitted for alcohol withdrawal multiple times.  Last time was a month ago at [facility]     Blood pressure 110/74, pulse (!) 57, temperature 98.4 F (36.9 C), temperature source Oral, resp. rate 16, height 6' (1.829 m), weight 79.4 kg, SpO2 99 %.Body mass index is 23.73 kg/m.  General Appearance: Casual  Paper scrubs  Eye Contact:  Good   Speech:  Clear and Coherent and Normal Rate  Volume:  Normal  Mood:  Anxious  Affect:  Appropriate and Congruent  Thought Process:  Coherent, Goal Directed and Descriptions of Associations: Intact  Orientation:  Full (Time, Place, and Person)  Thought Content:  WDL  Suicidal Thoughts:  No  Homicidal Thoughts:  No  Memory:  Immediate;   Good Recent;   Good  Judgement:  Intact  Insight:  Present  Psychomotor Activity:  Mild psychomotor agitation (restlessness in lower extremities and fidgeting with hands)  Concentration:  Concentration: Good and Attention Span: Good  Recall:  Good  Fund of Knowledge:  Fair  Language:  Good  Akathisia:  No  Handed:  Right  AIMS (if indicated):     Assets:  Communication Skills Desire for Improvement Financial Resources/Insurance Housing Leisure Time Social Support  ADL's:  Intact  Cognition:  WNL  Sleep:      Assessment:  Patient does not warrant psychiatric hospitalization at this time patient denies suicidal/self-harm/homicidal ideation, psychosis, and paranoia.  Patients main stressor is his alcohol use disorder and his anxiety and fear of what he experience when going through withdrawal.  At this time Dr. Charm Barges doesn't feel that patient current status warrants medical admission.  Peer Support has been order to see if there is a facility that offers detox and rehab services.  Want to assure patient is in a safe place during detox since there is a significant history of DT's and seizures with withdrawal.      Treatment Plan Summary: Plan Peer support consult  Disposition:  Psychiatrically cleared (psychiatric hospitalization)  No evidence of imminent risk to self or others at present.   Patient does not meet criteria for psychiatric inpatient admission. Supportive therapy provided about ongoing stressors. Discussed crisis plan, support from social network, calling 911, coming to the Emergency Department, and calling Suicide Hotline.  This  service was provided via telemedicine using a 2-way, interactive audio and video technology.  Names of all persons participating in this telemedicine service and their role in this encounter. Name: Assunta Found Role: NP  Name: Dr. Lucianne Muss Role: Psychiatrist  Name: Dr. Charm Barges Role: [facility]ien Mons EDP  Name: Valorie Roosevelt Role: Patient    Assunta Found, NP 09/06/2020 10:11 AM

## 2020-09-06 NOTE — ED Provider Notes (Signed)
Emergency Medicine Observation Re-evaluation Note  John Vaughan is a 29 y.o. male, seen on rounds today.  Pt initially presented to the ED for complaints of Alcohol Intoxication and Suicidal Currently, the patient is calm and cooperative.  Physical Exam  BP 110/74 (BP Location: Left Arm)   Pulse (!) 57   Temp 98.4 F (36.9 C) (Oral)   Resp 16   Ht 6' (1.829 m)   Wt 79.4 kg   SpO2 99%   BMI 23.73 kg/m  Physical Exam General: Alert no distress Cardiac: Regular rate and rhythm Lungs: Clear to auscultation Psych: Complaining of withdrawal symptoms, feeling shaky.  No objective tremors  ED Course / MDM  EKG:    I have reviewed the labs performed to date as well as medications administered while in observation.  Recent changes in the last 24 hours include medical clearance.  Plan  Current plan is for TTS eval. Patient is not under full [name] at this time.  Patient evaluated by TTS and psychiatrically cleared.  They question whether he needs to be admitted to the hospital for alcohol withdrawal.  Do not find him with any indication for admission for alcohol withdrawal.  Clear sensorium stable vitals.  Have ordered him some oral Librium.  Patient met with peers support and declined going to detox.  He states he is going to go over to family services and go get his medication straightened out.  Wants to call his father for a ride.   Hayden Rasmussen, MD 09/06/20 1755

## 2020-09-06 NOTE — Patient Outreach (Signed)
CPSS met with Pt an was made aware that Pt does not want to go to a facility. Pt stated that he wants to continue taking his medications to get stable with them because treatment does not help him at the time.  °

## 2020-09-06 NOTE — BH Assessment (Signed)
[facility] Assessment Progress Note  Per Shuvon Rankin, FNP, this pt does not require psychiatric hospitalization at this time.  Pt is psychiatrically cleared.  Discharge instructions include referral information for area substance abuse treatment providers.  Pt would also benefit from seeing Peer Support Specialists, and a peer support consult has been ordered for pt.  EDP Meridee Score, MD, and pt's nurse, Waynetta Sandy, have been notified.  Doylene Canning, MA Triage Specialist [phone]

## 2020-12-01 ENCOUNTER — Ambulatory Visit (HOSPITAL_COMMUNITY): Payer: Self-pay | Admitting: Behavioral Health

## 2020-12-03 DIAGNOSIS — F10931 Alcohol use, unspecified with withdrawal delirium: Secondary | ICD-10-CM | POA: Insufficient documentation

## 2020-12-12 DIAGNOSIS — Z765 Malingerer [conscious simulation]: Secondary | ICD-10-CM | POA: Insufficient documentation

## 2020-12-26 ENCOUNTER — Ambulatory Visit (HOSPITAL_COMMUNITY): Payer: Self-pay | Admitting: Behavioral Health

## 2021-03-05 DIAGNOSIS — U071 COVID-19: Secondary | ICD-10-CM | POA: Insufficient documentation

## 2021-03-05 DIAGNOSIS — S22060A Wedge compression fracture of T7-T8 vertebra, initial encounter for closed fracture: Secondary | ICD-10-CM | POA: Insufficient documentation

## 2021-03-19 ENCOUNTER — Encounter (HOSPITAL_COMMUNITY): Payer: Self-pay

## 2021-03-19 ENCOUNTER — Emergency Department (HOSPITAL_COMMUNITY): Payer: Self-pay

## 2021-03-19 ENCOUNTER — Other Ambulatory Visit: Payer: Self-pay

## 2021-03-19 ENCOUNTER — Inpatient Hospital Stay (HOSPITAL_COMMUNITY)
Admission: EM | Admit: 2021-03-19 | Discharge: 2021-03-23 | DRG: 897 | Disposition: A | Discharge status: Home / Self Care | Payer: Self-pay | Attending: Family Medicine | Admitting: Family Medicine

## 2021-03-19 DIAGNOSIS — R7401 Elevation of levels of liver transaminase levels: Secondary | ICD-10-CM

## 2021-03-19 DIAGNOSIS — E872 Acidosis: Secondary | ICD-10-CM | POA: Diagnosis present

## 2021-03-19 DIAGNOSIS — K7010 Alcoholic hepatitis without ascites: Secondary | ICD-10-CM | POA: Diagnosis present

## 2021-03-19 DIAGNOSIS — Z9151 Personal history of suicidal behavior: Secondary | ICD-10-CM

## 2021-03-19 DIAGNOSIS — Z20822 Contact with and (suspected) exposure to covid-19: Secondary | ICD-10-CM | POA: Diagnosis present

## 2021-03-19 DIAGNOSIS — K8020 Calculus of gallbladder without cholecystitis without obstruction: Secondary | ICD-10-CM | POA: Diagnosis present

## 2021-03-19 DIAGNOSIS — F10239 Alcohol dependence with withdrawal, unspecified: Secondary | ICD-10-CM | POA: Diagnosis present

## 2021-03-19 DIAGNOSIS — Z818 Family history of other mental and behavioral disorders: Secondary | ICD-10-CM

## 2021-03-19 DIAGNOSIS — Z811 Family history of alcohol abuse and dependence: Secondary | ICD-10-CM

## 2021-03-19 DIAGNOSIS — F10230 Alcohol dependence with withdrawal, uncomplicated: Principal | ICD-10-CM

## 2021-03-19 DIAGNOSIS — F17210 Nicotine dependence, cigarettes, uncomplicated: Secondary | ICD-10-CM | POA: Diagnosis present

## 2021-03-19 DIAGNOSIS — F32A Depression, unspecified: Secondary | ICD-10-CM | POA: Diagnosis present

## 2021-03-19 DIAGNOSIS — F419 Anxiety disorder, unspecified: Secondary | ICD-10-CM | POA: Diagnosis present

## 2021-03-19 DIAGNOSIS — E876 Hypokalemia: Secondary | ICD-10-CM | POA: Diagnosis not present

## 2021-03-19 DIAGNOSIS — F10939 Alcohol use, unspecified with withdrawal, unspecified: Secondary | ICD-10-CM | POA: Diagnosis present

## 2021-03-19 DIAGNOSIS — K760 Fatty (change of) liver, not elsewhere classified: Secondary | ICD-10-CM | POA: Diagnosis present

## 2021-03-19 DIAGNOSIS — F10930 Alcohol use, unspecified with withdrawal, uncomplicated: Secondary | ICD-10-CM

## 2021-03-19 DIAGNOSIS — D6959 Other secondary thrombocytopenia: Secondary | ICD-10-CM | POA: Diagnosis present

## 2021-03-19 DIAGNOSIS — F10231 Alcohol dependence with withdrawal delirium: Principal | ICD-10-CM | POA: Diagnosis present

## 2021-03-19 DIAGNOSIS — F314 Bipolar disorder, current episode depressed, severe, without psychotic features: Secondary | ICD-10-CM

## 2021-03-19 LAB — COMPREHENSIVE METABOLIC PANEL
ALT: 121 U/L — ABNORMAL HIGH (ref 0–44)
AST: 121 U/L — ABNORMAL HIGH (ref 15–41)
Albumin: 4.5 g/dL (ref 3.5–5.0)
Alkaline Phosphatase: 105 U/L (ref 38–126)
Anion gap: 17 — ABNORMAL HIGH (ref 5–15)
BUN: 7 mg/dL (ref 6–20)
CO2: 21 mmol/L — ABNORMAL LOW (ref 22–32)
Calcium: 9.6 mg/dL (ref 8.9–10.3)
Chloride: 101 mmol/L (ref 98–111)
Creatinine, Ser: 0.73 mg/dL (ref 0.61–1.24)
GFR, Estimated: >60 mL/min (ref >60)
Glucose, Bld: 90 mg/dL (ref 70–99)
Potassium: 4.2 mmol/L (ref 3.5–5.1)
Sodium: 139 mmol/L (ref 135–145)
Total Bilirubin: 1 mg/dL (ref 0.3–1.2)
Total Protein: 8.3 g/dL — ABNORMAL HIGH (ref 6.5–8.1)

## 2021-03-19 LAB — CBC WITH DIFFERENTIAL/PLATELET
Abs Immature Granulocytes: 0.02 10*3/uL (ref 0.00–0.07)
Basophils Absolute: 0 10*3/uL (ref 0.0–0.1)
Basophils Relative: 1 %
Eosinophils Absolute: 0 10*3/uL (ref 0.0–0.5)
Eosinophils Relative: 0 %
HCT: 42.5 % (ref 39.0–52.0)
Hemoglobin: 14.5 g/dL (ref 13.0–17.0)
Immature Granulocytes: 0 %
Lymphocytes Relative: 10 %
Lymphs Abs: 0.5 10*3/uL — ABNORMAL LOW (ref 0.7–4.0)
MCH: 34.3 pg — ABNORMAL HIGH (ref 26.0–34.0)
MCHC: 34.1 g/dL (ref 30.0–36.0)
MCV: 100.5 fL — ABNORMAL HIGH (ref 80.0–100.0)
Monocytes Absolute: 0.5 10*3/uL (ref 0.1–1.0)
Monocytes Relative: 11 %
Neutro Abs: 3.5 10*3/uL (ref 1.7–7.7)
Neutrophils Relative %: 78 %
Platelets: 78 10*3/uL — ABNORMAL LOW (ref 150–400)
RBC: 4.23 MIL/uL (ref 4.22–5.81)
RDW: 13.5 % (ref 11.5–15.5)
WBC: 4.6 10*3/uL (ref 4.0–10.5)
nRBC: 0 % (ref 0.0–0.2)

## 2021-03-19 LAB — RESP PANEL BY RT-PCR (FLU A&B, COVID) ARPGX2
Influenza A by PCR: NEGATIVE
Influenza B by PCR: NEGATIVE
SARS Coronavirus 2 by RT PCR: NEGATIVE

## 2021-03-19 LAB — PROTIME-INR
INR: 0.9 (ref 0.8–1.2)
Prothrombin Time: 12.1 seconds (ref 11.4–15.2)

## 2021-03-19 LAB — LACTIC ACID, PLASMA
Lactic Acid, Venous: 4.1 mmol/L (ref 0.5–1.9)
Lactic Acid, Venous: 2.3 mmol/L (ref 0.5–1.9)

## 2021-03-19 LAB — MAGNESIUM: Magnesium: 1.9 mg/dL (ref 1.7–2.4)

## 2021-03-19 LAB — APTT: aPTT: 28 seconds (ref 24–36)

## 2021-03-19 MED ORDER — CHLORDIAZEPOXIDE HCL 25 MG PO CAPS: ORAL_CAPSULE | ORAL | 0 refills | Status: DC

## 2021-03-19 MED ORDER — ALUM & MAG HYDROXIDE-SIMETH 200-200-20 MG/5ML PO SUSP: 30.0000 mL | Freq: Four times a day (QID) | ORAL | Status: DC | PRN

## 2021-03-19 MED ORDER — LORAZEPAM 2 MG/ML IJ SOLN
0.0000 mg | Freq: Four times a day (QID) | INTRAMUSCULAR | Status: DC
  Administered 2021-03-19: 4 mg via INTRAVENOUS
  Administered 2021-03-19 – 2021-03-21 (×3): 2 mg via INTRAVENOUS
  Administered 2021-03-21: 4 mg via INTRAVENOUS
  Filled 2021-03-19 (×2): qty 1
  Filled 2021-03-19 (×2): qty 2
  Filled 2021-03-19: qty 1
  Filled 2021-03-19: qty 2

## 2021-03-19 MED ORDER — THIAMINE HCL 100 MG PO TABS
100.0000 mg | ORAL_TABLET | Freq: Every day | ORAL | Status: DC
  Administered 2021-03-20 – 2021-03-23 (×4): 100 mg via ORAL
  Filled 2021-03-19 (×4): qty 1

## 2021-03-19 MED ORDER — LORAZEPAM 1 MG PO TABS
0.0000 mg | ORAL_TABLET | Freq: Four times a day (QID) | ORAL | Status: DC
  Filled 2021-03-19: qty 2

## 2021-03-19 MED ORDER — THIAMINE HCL 100 MG/ML IJ SOLN
100.0000 mg | Freq: Every day | INTRAMUSCULAR | Status: DC
  Administered 2021-03-19: 100 mg via INTRAVENOUS
  Filled 2021-03-19: qty 2

## 2021-03-19 MED ORDER — LORAZEPAM 2 MG/ML IJ SOLN
2.0000 mg | Freq: Once | INTRAMUSCULAR | Status: AC | PRN
  Administered 2021-03-19: 2 mg via INTRAVENOUS
  Filled 2021-03-19: qty 1

## 2021-03-19 MED ORDER — LORAZEPAM 1 MG PO TABS: 0.0000 mg | ORAL_TABLET | Freq: Two times a day (BID) | ORAL | Status: DC

## 2021-03-19 MED ORDER — LORAZEPAM 2 MG/ML IJ SOLN
0.0000 mg | Freq: Two times a day (BID) | INTRAMUSCULAR | Status: DC
  Filled 2021-03-19: qty 2

## 2021-03-19 MED ORDER — LACTATED RINGERS IV BOLUS
1000.0000 mL | Freq: Once | INTRAVENOUS | Status: AC
  Administered 2021-03-19: 1000 mL via INTRAVENOUS

## 2021-03-19 MED ORDER — ONDANSETRON HCL 4 MG PO TABS: 4.0000 mg | ORAL_TABLET | Freq: Three times a day (TID) | ORAL | Status: DC | PRN

## 2021-03-19 MED ORDER — CHLORDIAZEPOXIDE HCL 25 MG PO CAPS
25.0000 mg | ORAL_CAPSULE | Freq: Once | ORAL | Status: AC
  Administered 2021-03-19: 25 mg via ORAL
  Filled 2021-03-19: qty 1

## 2021-03-19 NOTE — ED Notes (Signed)
Attempted to ambulate patient. Pt continues to have visible tremors and is unsteady on feet. Rhunette Croft, MD aware.

## 2021-03-19 NOTE — ED Provider Notes (Addendum)
[facility]-EMERGENCY DEPT Provider Note   CSN: [id number] Arrival date & time: 03/19/21  1843     History Chief Complaint  Patient presents with  . Emesis  . Tremors    John Vaughan is a 30 y.o. male.  HPI    30 year old male with history of alcoholism, depression comes in a chief complaint of vomiting and tremors.  Patient reports that he started feeling sick this morning.  His last alcoholic beverage was yesterday.  He woke up today feeling weak and having a fever of 102.  He has not drank today, and has started having some shakes.  He is already started cutting back on drinking and has been having tremors at baseline.  Review of system is positive for cough.  Patient denies any other URI-like symptoms, abdominal pain, UTI-like symptoms, new rash.  He has no headaches, back pain.  Patient has COVID-19 exposure.  Past Medical History:  Diagnosis Date  . Alcohol abuse   . Anxiety   . Depression     Patient Active Problem List   Diagnosis Date Noted  . Alcohol withdrawal (HCC) 09/06/2020  . 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 Homicidal ideation   . GAD (generalized anxiety disorder) 03/09/2019  . Alcohol use disorder, severe, dependence (HCC) 03/09/2019  . 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 No pertinent surgical history.     Family History  Problem Relation Age of Onset  . Anxiety disorder Mother   . Alcohol abuse Father   . Anxiety disorder Maternal Aunt   . Anxiety disorder Maternal Grandmother   . Alcohol abuse Paternal Grandfather     Social History   Tobacco Use  . Smoking status: Current Every Day Smoker    Packs/day: 1.00    Types: Cigarettes  . Smokeless tobacco: Former Neurosurgeon    Types: Snuff  Vaping Use  . Vaping Use: Never used  Substance Use Topics  . Alcohol use: Yes    Alcohol/week: 18.0 standard drinks     Types: 18 Cans of beer per week    Comment: states that he drinks 2-4 beers daily  . Drug use: Not Currently    Types: Marijuana    Home Medications Prior to Admission medications   Medication Sig Start Date End Date Taking? Authorizing Provider  clonazePAM (KLONOPIN) 0.5 MG tablet Take 1 tablet (0.5 mg total) by mouth 2 (two) times daily as needed for anxiety. Patient taking differently: Take 1 mg by mouth daily as needed for anxiety. 04/22/20  Yes Henderly, Britni A, PA-C  gabapentin (NEURONTIN) 300 MG capsule Take 900 mg by mouth 3 (three) times daily.  04/14/20  Yes [provider]  Multiple Vitamins-Minerals (MULTIVITAMIN ADULTS PO) Take 1 tablet by mouth daily.   Yes [provider]  ondansetron (ZOFRAN) 4 MG tablet Take 1 tablet (4 mg total) by mouth every 6 (six) hours. 05/27/20  Yes Carroll Sage, PA-C  pantoprazole (PROTONIX) 40 MG tablet Take 40 mg by mouth daily as needed (heartburn). 12/08/20  Yes [provider]  sertraline (ZOLOFT) 50 MG tablet Take 50 mg by mouth daily. 06/09/20  Yes [provider]  chlordiazePOXIDE (LIBRIUM) 25 MG capsule 50mg  PO TID x 1D, then 25 mg PO BID X 1D, then 25 mg PO QD X 2D 03/19/21   03/21/21, MD    Allergies    Patient has no known allergies.  Review  of Systems   Review of Systems  Constitutional: Positive for activity change.  Respiratory: Positive for cough.   Gastrointestinal: Negative for abdominal pain.  Allergic/Immunologic: Negative for immunocompromised state.  Neurological: Positive for tremors.  All other systems reviewed and are negative.   Physical Exam Updated Vital Signs BP 135/84 (BP Location: Right Arm)   Pulse 89   Temp 99 F (37.2 C) (Oral)   Resp 16   Ht 6' (1.829 m)   Wt 79.4 kg   SpO2 92%   BMI 23.73 kg/m   Physical Exam Vitals and nursing note reviewed.  Constitutional:      Appearance: He is well-developed.  HENT:     Head: Atraumatic.  Eyes:      Extraocular Movements: Extraocular movements intact.     Pupils: Pupils are equal, round, and reactive to light.  Cardiovascular:     Rate and Rhythm: Normal rate.  Pulmonary:     Effort: Pulmonary effort is normal.  Abdominal:     Tenderness: There is no abdominal tenderness.  Musculoskeletal:     Cervical back: Neck supple.  Skin:    General: Skin is warm.  Neurological:     Mental Status: He is alert and oriented to person, place, and time.     Comments: Patient has bilateral upper extremity tremors     ED Results / Procedures / Treatments   Labs (all labs ordered are listed, but only abnormal results are displayed) Labs Reviewed  LACTIC ACID, PLASMA - Abnormal; Notable for the following components:      Result Value   Lactic Acid, Venous 4.1 (*)    All other components within normal limits  LACTIC ACID, PLASMA - Abnormal; Notable for the following components:   Lactic Acid, Venous 2.3 (*)    All other components within normal limits  COMPREHENSIVE METABOLIC PANEL - Abnormal; Notable for the following components:   CO2 21 (*)    Total Protein 8.3 (*)    AST 121 (*)    ALT 121 (*)    Anion gap 17 (*)    All other components within normal limits  CBC WITH DIFFERENTIAL/PLATELET - Abnormal; Notable for the following components:   MCV 100.5 (*)    MCH 34.3 (*)    Platelets 78 (*)    Lymphs Abs 0.5 (*)    All other components within normal limits  RESP PANEL BY RT-PCR (FLU A&B, COVID) ARPGX2  URINE CULTURE  CULTURE, BLOOD (ROUTINE X 2)  CULTURE, BLOOD (ROUTINE X 2)  PROTIME-INR  APTT  MAGNESIUM  URINALYSIS, ROUTINE W REFLEX MICROSCOPIC    EKG EKG Interpretation  Date/Time:  Sunday March 19 2021 19:35:11 EDT Ventricular Rate:  89 PR Interval:    QRS Duration: 103 QT Interval:  457 QTC Calculation: 557 R Axis:   99 Text Interpretation: Sinus rhythm Consider right ventricular hypertrophy ST elev, probable normal early repol pattern Prolonged QT interval No  acute changes No significant change since last tracing Confirmed by Derwood Kaplan ([id number]) on 03/19/2021 8:10:11 PM   Radiology DG Chest Port 1 View  Result Date: 03/19/2021 CLINICAL DATA:  Questionable sepsis - evaluate for abnormality Chest pain and fever. EXAM: PORTABLE CHEST 1 VIEW COMPARISON:  03/02/2021 radiograph and CT FINDINGS: The cardiomediastinal contours are normal. The lungs are clear. Pulmonary vasculature is normal. No consolidation, pleural effusion, or pneumothorax. No acute osseous abnormalities are seen. Thoracic compression fractures are not well seen by AP chest radiograph. IMPRESSION: No acute chest findings.  Electronically Signed   By: Narda RutherfordMelanie  Sanford M.D.   On: 03/19/2021 19:37    Procedures .Critical Care Performed by: Derwood KaplanNanavati, Kervens Roper, MD Authorized by: Derwood KaplanNanavati, Takai Chiaramonte, MD   Critical care provider statement:    Critical care time (minutes):  46   Critical care was necessary to treat or prevent imminent or life-threatening deterioration of the following conditions:  CNS failure or compromise and toxidrome   Critical care was time spent personally by me on the following activities:  Discussions with consultants, evaluation of patient's response to treatment, examination of patient, ordering and performing treatments and interventions, ordering and review of laboratory studies, ordering and review of radiographic studies, pulse oximetry, re-evaluation of patient's condition, obtaining history from patient or surrogate and review of old charts     Medications Ordered in ED Medications  LORazepam (ATIVAN) injection 0-4 mg (4 mg Intravenous Given 03/19/21 1938)    Or  LORazepam (ATIVAN) tablet 0-4 mg ( Oral See Alternative 03/19/21 1938)  LORazepam (ATIVAN) injection 0-4 mg (has no administration in time range)    Or  LORazepam (ATIVAN) tablet 0-4 mg (has no administration in time range)  thiamine tablet 100 mg ( Oral See Alternative 03/19/21 1944)    Or  thiamine  (B-1) injection 100 mg (100 mg Intravenous Given 03/19/21 1944)  ondansetron (ZOFRAN) tablet 4 mg (has no administration in time range)  alum & mag hydroxide-simeth (MAALOX/MYLANTA) 200-200-20 MG/5ML suspension 30 mL (has no administration in time range)  lactated ringers bolus 1,000 mL (0 mLs Intravenous Stopped 03/19/21 2107)  chlordiazePOXIDE (LIBRIUM) capsule 25 mg (25 mg Oral Given 03/19/21 2216)  LORazepam (ATIVAN) injection 2 mg (2 mg Intravenous Given 03/19/21 2338)    ED Course  I have reviewed the triage vital signs and the nursing notes.  Pertinent labs & imaging results that were available during my care of the patient were reviewed by me and considered in my medical decision making (see chart for details).  Clinical Course as of 03/19/21 2339  Wynelle LinkSun Mar 19, 2021  2309 Lactic Acid, Venous(!!): 2.3 [AN]  2309 Lactate clearing, po challenge passed. Oral Librium given.  Stable for discharge. White count is normal.  Covid test is negative.  Labs are overall reassuring at this time.  Return precautions discussed. [AN]  2330 We will discharge the patient, however he had unsteady gait.  His tremor is worse.  CIWA score is 22. We will on discharge the patient.  His care to will be signed out to incoming team. [AN]    Clinical Course User Index [AN] Derwood KaplanNanavati, Juanangel Soderholm, MD   MDM Rules/Calculators/A&P                          30 year old male comes in with chief complaint of tremors and weakness.  He also reports subjective fever at home.  Patient has history of alcoholism.  His last alcoholic beverage was last night and admits that he probably would have been drinking today if it were not for the fact that he is sick.  He does want to strongly consider attempt towards sobriety again.  Currently he is afebrile.  Patient has tremors but otherwise he has no focal neuro deficits and exam did not reveal any abdominal tenderness, rhonchi or rales on lung exam and patient does not have any  evidence of infection over his torso.  He states that he has had severe withdrawals in the past including having seizures.  For now we  will get basic labs, chest x-ray and reassess.  Final Clinical Impression(s) / ED Diagnoses Final diagnoses:  Alcohol withdrawal syndrome without complication (HCC)    Rx / DC Orders ED Discharge Orders         Ordered    chlordiazePOXIDE (LIBRIUM) 25 MG capsule        03/19/21 2313           Derwood Kaplan, MD 03/19/21 2021    Derwood Kaplan, MD 03/19/21 2339

## 2021-03-19 NOTE — Discharge Instructions (Addendum)
Alcohol Abuse and Dependence Information, Adult Alcohol is a widely available drug. People drink alcohol in different amounts. People who drink alcohol very often and in large amounts often have problems during and after drinking. They may develop what is called an alcohol use disorder. There are two main types of alcohol use disorders:  Alcohol abuse. This is when you use alcohol too much or too often. You may use alcohol to make yourself feel happy or to reduce stress. You may have a hard time setting a limit on the amount you drink.  Alcohol dependence. This is when you use alcohol consistently for a period of time, and your body changes as a result. This can make it hard to stop drinking because you may start to feel sick or feel different when you do not use alcohol. These symptoms are known as withdrawal. How can alcohol abuse and dependence affect me? Alcohol abuse and dependence can have a negative effect on your life. Drinking too much can lead to addiction. You may feel like you need alcohol to function normally. You may drink alcohol before work in the morning, during the day, or as soon as you get home from work in the evening. These actions can result in:  Poor work performance.  Job loss.  Financial problems.  Car crashes or criminal charges from driving after drinking alcohol.  Problems in your relationships with friends and family.  Losing the trust and respect of coworkers, friends, and family. Drinking heavily over a long period of time can permanently damage your body and brain, and can cause lifelong health issues, such as:  Damage to your liver or pancreas.  Heart problems, high blood pressure, or stroke.  Certain cancers.  Decreased ability to fight infections.  Brain or nerve damage.  Depression.  Early (premature) death. If you are careless or you crave alcohol, it is easy to drink more than your body can handle (overdose). Alcohol overdose is a serious  situation that requires hospitalization. It may lead to permanent injuries or death. What can increase my risk?  Having a family history of alcohol abuse.  Having depression or other mental health conditions.  Beginning to drink at an early age.  Binge drinking often.  Experiencing trauma, stress, and an unstable home life during childhood.  Spending time with people who drink often. What actions can I take to prevent or manage alcohol abuse and dependence?  Do not drink alcohol if: ? Your health care provider tells you not to drink. ? You are pregnant, may be pregnant, or are planning to become pregnant.  If you drink alcohol: ? Limit how much you use to:  0-1 drink a day for women.  0-2 drinks a day for men. ? Be aware of how much alcohol is in your drink. In the [place] one drink equals one 12 oz bottle of beer (355 mL), one 5 oz glass of wine (148 mL), or one 1 oz glass of hard liquor (44 mL).  Stop drinking if you have been drinking too much. This can be very hard to do if you are used to abusing alcohol. If you begin to have withdrawal symptoms, talk with your health care provider or a person that you trust. These symptoms may include anxiety, shaky hands, headache, nausea, sweating, or not being able to sleep.  Choose to drink nonalcoholic beverages in social gatherings and places where there may be alcohol. Activity  Spend more time on activities that you enjoy that  do not involve alcohol, like hobbies or exercise.  Find healthy ways to cope with stress, such as exercise, meditation, or spending time with people you care about. General information  Talk to your family, coworkers, and friends about supporting you in your efforts to stop drinking. If they drink, ask them not to drink around you. Spend more time with people who do not drink alcohol.  If you think that you have an alcohol dependency problem: ? Tell friends or family about your concerns. ? Talk with your  health care provider or another health professional about where to get help. ? Work with a Paramedictherapist and a Network engineerchemical dependency counselor. ? Consider joining a support group for people who struggle with alcohol abuse and dependence. Where to find support  Your health care provider.  SMART Recovery: www.smartrecovery.org Therapy and support groups  Local treatment centers or chemical dependency counselors.  Local AA groups in your community: SalaryStart.tnwww.aa.org   Where to find more information  Centers for Disease Control and Prevention: FootballExhibition.com.brwww.cdc.gov  [employer] Institute on Alcohol Abuse and Alcoholism: BasicStudents.dkwww.niaaa.nih.gov  Alcoholics Anonymous (AA): SalaryStart.tnwww.aa.org Contact a health care provider if:  You drank more or for longer than you intended on more than one occasion.  You tried to stop drinking or to cut back on how much you drink, but you were not able to.  You often drink to the point of vomiting or passing out.  You want to drink so badly that you cannot think about anything else.  You have problems in your life due to drinking, but you continue to drink.  You keep drinking even though you feel anxious, depressed, or have experienced memory loss.  You have stopped doing the things you used to enjoy in order to drink.  You have to drink more than you used to in order to get the effect you want.  You experience anxiety, sweating, nausea, shakiness, and trouble sleeping when you try to stop drinking. Get help right away if:  You have thoughts about hurting yourself or others.  You have serious withdrawal symptoms, including: ? Confusion. ? Racing heart. ? High blood pressure. ? Fever. If you ever feel like you may hurt yourself or others, or have thoughts about taking your own life, get help right away. You can go to your nearest emergency department or call:  Your local emergency services (911 in the [place]  A suicide crisis helpline, such as the National Suicide Prevention  Lifeline at 23903829081-219 832 9900. This is open 24 hours a day. Summary  Alcohol abuse and dependence can have a negative effect on your life. Drinking too much or too often can lead to addiction.  If you drink alcohol, limit how much you use.  If you are having trouble keeping your drinking under control, find ways to change your behavior. Hobbies, calming activities, exercise, or support groups can help.  If you feel you need help with changing your drinking habits, talk with your health care provider, a good friend, or a therapist, or go to an AA group. This information is not intended to replace advice given to you by your health care provider. Make sure you discuss any questions you have with your health care provider. Document Revised: 03/31/2019 Document Reviewed: 02/17/2019 Elsevier Patient Education  2021 Elsevier Inc.   Alcohol Withdrawal Syndrome When a person who drinks a lot of alcohol stops drinking, he or she may have unpleasant and serious symptoms. These symptoms are called alcohol withdrawal syndrome. This condition may  be mild or severe. It can be life-threatening. It can cause:  Shaking that you cannot control (tremor).  Sweating.  Headache.  Feeling fearful, upset, grouchy, or depressed.  Trouble sleeping (insomnia).  Nightmares.  Fast or uneven heartbeats (palpitations).  Alcohol cravings.  Feeling sick to your stomach (nausea).  Throwing up (vomiting).  Being bothered by light and sounds.  Confusion.  Trouble thinking clearly.  Not being hungry (loss of appetite).  Big changes in mood (mood swings). If you have all of the following symptoms at the same time, get help right away:  High blood pressure.  Fast heartbeat.  Trouble breathing.  Seizures.  Seeing, hearing, feeling, smelling, or tasting things that are not there (hallucinations). These symptoms are known as delirium tremens (DTs). They must be treated at the hospital right  away. Follow these instructions at home:  Take over-the-counter and prescription medicines only as told by your doctor. This includes vitamins.  Do not drink alcohol.  Do not drive until your doctor says that this is safe for you.  Have someone stay with you or be available in case you need help. This should be someone you trust. This person can help you with your symptoms. He or she can also help you to not drink.  Drink enough fluid to keep your pee (urine) pale yellow.  Think about joining a support group or a treatment program to help you stop drinking.  Keep all follow-up visits as told by your doctor. This is important.   Contact a doctor if:  Your symptoms get worse.  You cannot eat or drink without throwing up.  You have a hard time not drinking alcohol.  You cannot stop drinking alcohol. Get help right away if:  You have fast or uneven heartbeats.  You have chest pain.  You have trouble breathing.  You have a seizure for the first time.  You see, hear, feel, smell, or taste something that is not there.  You get very confused. Summary  When a person who drinks a lot of alcohol stops drinking, he or she may have serious symptoms. This is called alcohol withdrawal syndrome.  Delirium tremens (DTs) is a group of life-threatening symptoms. You should get help right away if you have these symptoms.  Think about joining an alcohol support group or a treatment program. This information is not intended to replace advice given to you by your health care provider. Make sure you discuss any questions you have with your health care provider. Document Revised: 11/22/2017 Document Reviewed: 08/16/2017 Elsevier Patient Education  2021 Elsevier Inc.   Alcohol Withdrawal Syndrome When a person who drinks a lot of alcohol stops drinking, he or she may have unpleasant and serious symptoms. These symptoms are called alcohol withdrawal syndrome. This condition may be mild or  severe. It can be life-threatening. It can cause:  Shaking that you cannot control (tremor).  Sweating.  Headache.  Feeling fearful, upset, grouchy, or depressed.  Trouble sleeping (insomnia).  Nightmares.  Fast or uneven heartbeats (palpitations).  Alcohol cravings.  Feeling sick to your stomach (nausea).  Throwing up (vomiting).  Being bothered by light and sounds.  Confusion.  Trouble thinking clearly.  Not being hungry (loss of appetite).  Big changes in mood (mood swings). If you have all of the following symptoms at the same time, get help right away:  High blood pressure.  Fast heartbeat.  Trouble breathing.  Seizures.  Seeing, hearing, feeling, smelling, or tasting things that are not  there (hallucinations). These symptoms are known as delirium tremens (DTs). They must be treated at the hospital right away. Follow these instructions at home:  Take over-the-counter and prescription medicines only as told by your doctor. This includes vitamins.  Do not drink alcohol.  Do not drive until your doctor says that this is safe for you.  Have someone stay with you or be available in case you need help. This should be someone you trust. This person can help you with your symptoms. He or she can also help you to not drink.  Drink enough fluid to keep your pee (urine) pale yellow.  Think about joining a support group or a treatment program to help you stop drinking.  Keep all follow-up visits as told by your doctor. This is important.   Contact a doctor if:  Your symptoms get worse.  You cannot eat or drink without throwing up.  You have a hard time not drinking alcohol.  You cannot stop drinking alcohol. Get help right away if:  You have fast or uneven heartbeats.  You have chest pain.  You have trouble breathing.  You have a seizure for the first time.  You see, hear, feel, smell, or taste something that is not there.  You get very  confused. Summary  When a person who drinks a lot of alcohol stops drinking, he or she may have serious symptoms. This is called alcohol withdrawal syndrome.  Delirium tremens (DTs) is a group of life-threatening symptoms. You should get help right away if you have these symptoms.  Think about joining an alcohol support group or a treatment program. This information is not intended to replace advice given to you by your health care provider. Make sure you discuss any questions you have with your health care provider. Document Revised: 11/22/2017 Document Reviewed: 08/16/2017 Elsevier Patient Education  2021 Elsevier Inc. Take the medications prescribed for alcohol withdrawals. Also utilize the resources that will help you with your sobriety  Substance Abuse Treatment Programs  Intensive Outpatient Programs [facility] Services     [address]      [place], [place]                   [phone]       [facility] [address] [place] [address] [place], [place] [phone]  [facility]     (Inpatient and outpatient)     [address]           [phone]    [facility] [phone] (Suboxone and Methadone)  [address]      [place], [place] [address]      [phone]       [address] [place], [place] [phone]  [facility] (Outpatient/Inpatient, [facility])    (insurance only) [phone]             [facility] (Groups & Residential) [place], [place] [phone]     [facility]     [address]     [place], [place]      [phone]       [facility] (for caregivers and family) [phone] [address] [place], [place] [phone]      Residential Treatment Programs [facility]      [address], [place], [place] [address]  [phone]       [facility] [address] [place], [place] [address] [phone]  [facility]         [phone]       [facility] [phone]  [facility] ([facility]             [address]                                         [place], [place]                                                [phone] or [phone]                               [facility] [place] [address] [place], [address] [phone]  [facility]    [address]      [place], [place]     [phone]       [facility] [address] [place], [place] [phone]  [facility]   [address] [place], [place] [address]     [phone]      Admissions: 8am-3pm M-F  [facility] ([facility]) [address] [place], [place] [phone]  [facility]: Residential Program [phone] Days)   [place], [place]      [phone] or [phone]     [facility]C: [facility] [place], [place] (Walk in Hours over the weekend or by referral)  [facility] [address] [place], [place], [place] [address] [phone]  Crisis Mobile: [facility]:  [phone] (for crisis response 24 hours a day) [facility] Hotline:      [phone] Outpatient Psychiatry and Counseling  [facility]: Mobile Crisis Management 24 hours:  [phone]  [facility] [employer] sliding scale fee and walk in schedule: M-F 8am-12pm/1pm-3pm [address]  [place], [place] [address] [phone]  [facility] [address] [place], [place] [address] [phone]  [facility] ([facility])- new patient walk-in appointments available Monday - Friday 8am -3pm.          [address] [place], [place] [address] [phone] or crisis line- [phone]  [facility]/ Intensive Outpatient Therapy Program [address] [place], [place]  [address] [phone]  [facility]                  Crisis Services      [phone] [address]     [place], [place] [address]                 [facility]   [facility] [phone]. [address] [place], [place] [address]   [facility]          [address] [place]  [place], [place] [address]       [phone]  [facility] [address] [place], [place] [address] [phone]  [facility]    [address]    [place], [place] [address]     [phone]  Andee Poles, MD     [address]     [place] [address]     [phone]       [facility] [address] [place] [address]  [facility]     [phone] [address] [place], [place]      [phone]       [facility] Eulogio Ditch, MD [address] [place], [place] [address] [phone]  [facility]     [address]     [place], [place] [address]     [phone]       [facility] [address] [place], [place] [address] [phone] Resources for Temporary Residential Assistance/Crisis Centers  [facility]) M-F 8am-3pm   [address] [place], [place] [address]   [phone] Services include: laundry, barbering, support groups, case management, phone  & computer access, showers, AA/NA mtgs, mental health/substance abuse nurse, job skills class, disability information, VA assistance, spiritual classes, etc.

## 2021-03-19 NOTE — ED Notes (Signed)
Patient medicated per Rhunette Croft, MD. Pt A&Ox4. Respirations regular/unlabored. Able to speak full sentences. Placed on 2 L [place]. O2 sats 98% on room air.

## 2021-03-19 NOTE — ED Triage Notes (Signed)
Pt BIB EMS from home. Pt reports having  fever, tremors, and vomiting. Pt reports when he woke up this morning he could not move or walk. Pt also reports significant decrease in alcohol consumption recently. Pt has not been taking seizure medication.

## 2021-03-19 NOTE — ED Notes (Addendum)
Assumed care of this patient. Pt reports going through alcohol withdraws. Pt reports going from 10 drinks a day to 1 drink a day. Visible tremors noted. EKG obtained and given to Rhunette Croft, MD. Vitals taken. A&Ox4. Placed in gown. Pt medicated per MAR. PIV/labs obtained. Connected to cardiac monitor, BP, and pulse ox. Stretcher low, wheels locked call bell within reach. 2L [place] placed on patient after medicated per MAR. MD aware of situation.

## 2021-03-19 NOTE — ED Notes (Signed)
Critical lab value Lactic 4.1. Rhunette Croft, MD notified verbally. Redraw lactic after LR bolus completion.

## 2021-03-19 NOTE — ED Notes (Signed)
Lactic 2.3, Rhunette Croft, MD notified.

## 2021-03-20 ENCOUNTER — Encounter (HOSPITAL_COMMUNITY): Payer: Self-pay | Admitting: Family Medicine

## 2021-03-20 DIAGNOSIS — F32A Depression, unspecified: Secondary | ICD-10-CM

## 2021-03-20 DIAGNOSIS — E872 Acidosis: Secondary | ICD-10-CM

## 2021-03-20 DIAGNOSIS — F10230 Alcohol dependence with withdrawal, uncomplicated: Secondary | ICD-10-CM

## 2021-03-20 DIAGNOSIS — F419 Anxiety disorder, unspecified: Secondary | ICD-10-CM

## 2021-03-20 DIAGNOSIS — F314 Bipolar disorder, current episode depressed, severe, without psychotic features: Secondary | ICD-10-CM

## 2021-03-20 DIAGNOSIS — Z720 Tobacco use: Secondary | ICD-10-CM

## 2021-03-20 DIAGNOSIS — F10239 Alcohol dependence with withdrawal, unspecified: Secondary | ICD-10-CM

## 2021-03-20 DIAGNOSIS — D72819 Decreased white blood cell count, unspecified: Secondary | ICD-10-CM

## 2021-03-20 DIAGNOSIS — D696 Thrombocytopenia, unspecified: Secondary | ICD-10-CM

## 2021-03-20 LAB — COMPREHENSIVE METABOLIC PANEL
ALT: 101 U/L — ABNORMAL HIGH (ref 0–44)
AST: 89 U/L — ABNORMAL HIGH (ref 15–41)
Albumin: 4.3 g/dL (ref 3.5–5.0)
Alkaline Phosphatase: 94 U/L (ref 38–126)
Anion gap: 13 (ref 5–15)
BUN: 7 mg/dL (ref 6–20)
CO2: 24 mmol/L (ref 22–32)
Calcium: 9.5 mg/dL (ref 8.9–10.3)
Chloride: 99 mmol/L (ref 98–111)
Creatinine, Ser: 0.75 mg/dL (ref 0.61–1.24)
GFR, Estimated: >60 mL/min (ref >60)
Glucose, Bld: 79 mg/dL (ref 70–99)
Potassium: 3.6 mmol/L (ref 3.5–5.1)
Sodium: 136 mmol/L (ref 135–145)
Total Bilirubin: 1.2 mg/dL (ref 0.3–1.2)
Total Protein: 7.5 g/dL (ref 6.5–8.1)

## 2021-03-20 LAB — URINALYSIS, ROUTINE W REFLEX MICROSCOPIC
Bacteria, UA: NONE SEEN
Bilirubin Urine: NEGATIVE
Glucose, UA: NEGATIVE mg/dL
Hgb urine dipstick: NEGATIVE
Ketones, ur: 20 mg/dL — AB
Leukocytes,Ua: NEGATIVE
Nitrite: NEGATIVE
Protein, ur: 30 mg/dL — AB
Specific Gravity, Urine: 1.018 (ref 1.005–1.030)
pH: 5 (ref 5.0–8.0)

## 2021-03-20 LAB — CBC
HCT: 38.8 % — ABNORMAL LOW (ref 39.0–52.0)
Hemoglobin: 13.2 g/dL (ref 13.0–17.0)
MCH: 33.5 pg (ref 26.0–34.0)
MCHC: 34 g/dL (ref 30.0–36.0)
MCV: 98.5 fL (ref 80.0–100.0)
Platelets: 67 10*3/uL — ABNORMAL LOW (ref 150–400)
RBC: 3.94 MIL/uL — ABNORMAL LOW (ref 4.22–5.81)
RDW: 13.1 % (ref 11.5–15.5)
WBC: 3.9 10*3/uL — ABNORMAL LOW (ref 4.0–10.5)
nRBC: 0 % (ref 0.0–0.2)

## 2021-03-20 LAB — MAGNESIUM: Magnesium: 1.8 mg/dL (ref 1.7–2.4)

## 2021-03-20 LAB — LACTIC ACID, PLASMA
Lactic Acid, Venous: 1.1 mmol/L (ref 0.5–1.9)
Lactic Acid, Venous: 0.7 mmol/L (ref 0.5–1.9)

## 2021-03-20 LAB — HIV ANTIBODY (ROUTINE TESTING W REFLEX): HIV Screen 4th Generation wRfx: NONREACTIVE

## 2021-03-20 MED ORDER — CHLORDIAZEPOXIDE HCL 25 MG PO CAPS
25.0000 mg | ORAL_CAPSULE | Freq: Four times a day (QID) | ORAL | Status: DC | PRN
  Administered 2021-03-21: 25 mg via ORAL
  Filled 2021-03-20: qty 1

## 2021-03-20 MED ORDER — LORAZEPAM 1 MG PO TABS
1.0000 mg | ORAL_TABLET | ORAL | Status: DC | PRN
Start: 2021-03-20 — End: 2021-03-21

## 2021-03-20 MED ORDER — ENOXAPARIN SODIUM 40 MG/0.4ML ~~LOC~~ SOLN
40.0000 mg | SUBCUTANEOUS | Status: DC
  Filled 2021-03-20 (×3): qty 0.4

## 2021-03-20 MED ORDER — CHLORDIAZEPOXIDE HCL 25 MG PO CAPS
25.0000 mg | ORAL_CAPSULE | Freq: Four times a day (QID) | ORAL | Status: AC
  Administered 2021-03-20 (×4): 25 mg via ORAL
  Filled 2021-03-20 (×4): qty 1

## 2021-03-20 MED ORDER — ONDANSETRON HCL 4 MG PO TABS
4.0000 mg | ORAL_TABLET | Freq: Four times a day (QID) | ORAL | Status: DC | PRN
  Administered 2021-03-20: 4 mg via ORAL
  Filled 2021-03-20: qty 1

## 2021-03-20 MED ORDER — SERTRALINE HCL 50 MG PO TABS
50.0000 mg | ORAL_TABLET | Freq: Every day | ORAL | Status: DC
  Administered 2021-03-20 – 2021-03-23 (×4): 50 mg via ORAL
  Filled 2021-03-20 (×4): qty 1

## 2021-03-20 MED ORDER — CHLORDIAZEPOXIDE HCL 25 MG PO CAPS: 25.0000 mg | ORAL_CAPSULE | ORAL | Status: DC

## 2021-03-20 MED ORDER — CHLORDIAZEPOXIDE HCL 25 MG PO CAPS
25.0000 mg | ORAL_CAPSULE | Freq: Three times a day (TID) | ORAL | Status: DC
  Administered 2021-03-21: 25 mg via ORAL
  Filled 2021-03-20: qty 1

## 2021-03-20 MED ORDER — SENNOSIDES-DOCUSATE SODIUM 8.6-50 MG PO TABS: 1.0000 | ORAL_TABLET | Freq: Every evening | ORAL | Status: DC | PRN

## 2021-03-20 MED ORDER — ONDANSETRON HCL 4 MG/2ML IJ SOLN
4.0000 mg | Freq: Four times a day (QID) | INTRAMUSCULAR | Status: DC | PRN
  Administered 2021-03-20: 4 mg via INTRAVENOUS
  Filled 2021-03-20: qty 2

## 2021-03-20 MED ORDER — FOLIC ACID 1 MG PO TABS
1.0000 mg | ORAL_TABLET | Freq: Every day | ORAL | Status: DC
  Administered 2021-03-20 – 2021-03-23 (×4): 1 mg via ORAL
  Filled 2021-03-20 (×4): qty 1

## 2021-03-20 MED ORDER — NICOTINE 14 MG/24HR TD PT24
14.0000 mg | MEDICATED_PATCH | Freq: Every day | TRANSDERMAL | Status: DC
  Filled 2021-03-20 (×2): qty 1

## 2021-03-20 MED ORDER — CHLORDIAZEPOXIDE HCL 25 MG PO CAPS: 25.0000 mg | ORAL_CAPSULE | Freq: Every day | ORAL | Status: DC

## 2021-03-20 MED ORDER — THIAMINE HCL 100 MG PO TABS: 100.0000 mg | ORAL_TABLET | Freq: Every day | ORAL | Status: DC

## 2021-03-20 MED ORDER — ACETAMINOPHEN 650 MG RE SUPP: 650.0000 mg | Freq: Four times a day (QID) | RECTAL | Status: DC | PRN

## 2021-03-20 MED ORDER — SODIUM CHLORIDE 0.9 % IV SOLN: INTRAVENOUS | Status: DC

## 2021-03-20 MED ORDER — LORAZEPAM 2 MG/ML IJ SOLN
1.0000 mg | INTRAMUSCULAR | Status: DC | PRN
  Administered 2021-03-20: 3 mg via INTRAVENOUS
  Administered 2021-03-20: 2 mg via INTRAVENOUS
  Administered 2021-03-21: 4 mg via INTRAVENOUS
  Administered 2021-03-21: 3 mg via INTRAVENOUS
  Administered 2021-03-21: 2 mg via INTRAVENOUS
  Filled 2021-03-20 (×3): qty 1
  Filled 2021-03-20: qty 2

## 2021-03-20 MED ORDER — ACETAMINOPHEN 325 MG PO TABS: 650.0000 mg | ORAL_TABLET | Freq: Four times a day (QID) | ORAL | Status: DC | PRN

## 2021-03-20 MED ORDER — THIAMINE HCL 100 MG/ML IJ SOLN: 100.0000 mg | Freq: Every day | INTRAMUSCULAR | Status: DC

## 2021-03-20 MED ORDER — ADULT MULTIVITAMIN W/MINERALS CH
1.0000 | ORAL_TABLET | Freq: Every day | ORAL | Status: DC
  Administered 2021-03-20 – 2021-03-23 (×4): 1 via ORAL
  Filled 2021-03-20 (×4): qty 1

## 2021-03-20 NOTE — ED Notes (Addendum)
Attempted to ambulate patient. Pt noted to have visible tremors with unsteady gait. HR 130's with ambulation. Then HR back to 80s-90s with rest. CIWA 22 . [place], MD aware. Vitals stable with rest. Patient denies discomfort/concerns with rest.

## 2021-03-20 NOTE — TOC Initial Note (Signed)
Transition of Care [facility]) - Initial/Assessment Note    Patient Details  Name: John Vaughan MRN: [id number] Date of Birth: 12-Jan-1991  [facility] of [facility] CM/SW Contact:    Ida Rogue, LCSW Phone Number: 03/20/2021, 9:55 AM  Clinical Narrative:   Patient seen in follow up to MD consult for substance abuse assessment.  Found John Vaughan awake, alert, no complaints, engageable.  When asked how we can support him in his continued sobriety, he identified "taking me medications when I leave here" as his main deterrent.  When asked what medications help, he cited librium and gabapentin, and that he gets these prescribed in the ED.  I asked if he was open to going to a community clinic to get these things prescribed, and he agreed as long as he gets help financially as he is not working.  [facility] with [facility].  Beyond that, John Vaughan is not asking for any other resources.  He declined a referral to a substance abuse in-patient program, outpatient services, and AA meetings.  States he has never been engaged in the first two, and when he tried meetings, "they only made things worse.  John Vaughan is in the precontemplative phase of change.  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                 Expected Discharge Plan: Home/Self Care Barriers to Discharge: No Barriers Identified   Patient Goals and CMS Choice        Expected Discharge Plan and Services Expected Discharge Plan: Home/Self Care In-house Referral: Clinical Social Work     Living arrangements for the past 2 months: Single Family Home                                      Prior Living Arrangements/Services Living arrangements for the past 2 months: Single Family  Home Lives with:: Parents Patient language and need for interpreter reviewed:: Yes        Need for Family Participation in Patient Care: Yes (Comment) Care giver support system in place?: Yes (comment)   Criminal Activity/Legal Involvement Pertinent to Current Situation/Hospitalization: No - Comment as needed  Activities of Daily Living Home Assistive Devices/Equipment: None ADL Screening (condition at time of admission) Is the patient deaf or have difficulty hearing?: No Does the patient have difficulty seeing, even when wearing glasses/contacts?: No Does the patient have difficulty concentrating, remembering, or making decisions?: No Patient able to express need for assistance with ADLs?: Yes Does the patient have difficulty dressing or bathing?: No Independently performs ADLs?: Yes (appropriate for developmental age) Does the patient have difficulty walking or climbing stairs?: No Weakness of Legs: Both Weakness of Arms/Hands: Both  Permission Sought/Granted                  Emotional Assessment Appearance:: Appears stated age Attitude/Demeanor/Rapport: Engaged Affect (typically observed): Appropriate Orientation: : Oriented to Self,Oriented to Place,Oriented to Situation Alcohol / Substance Use: Alcohol Use Psych Involvement: No (comment)  Admission diagnosis:  Alcohol withdrawal (HCC) [F10.239] Alcohol withdrawal syndrome without complication (HCC) [F10.230] Patient Active Problem List   Diagnosis Date Noted  .  Depression 03/20/2021  . Alcohol withdrawal (HCC) 09/06/2020  . 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 Homicidal ideation   . GAD (generalized anxiety disorder) 03/09/2019  . Alcohol use disorder, severe, dependence (HCC) 03/09/2019  . 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 [place] [address] [facility] [place] [address] Phone: [phone] Fax: [phone]     Social Determinants of Health (SDOH) Interventions    Readmission Risk Interventions No flowsheet data found.

## 2021-03-20 NOTE — ED Notes (Signed)
Verbal SBAR given to [employer].

## 2021-03-20 NOTE — Progress Notes (Signed)
Care started prior to midnight in the emergency room and patient was admitted early this morning after midnight by Dr. Mayra Neer and I am in current agreement with this assessment and plan.  Additional changes to the plan of care been made accordingly.  The patient is a 30 year old slightly disheveled Caucasian male with a past medical history significant for but not limited to alcohol abuse, anxiety and depression, as well as other comorbidities who presented with a chief complaint of fatigue, tremors and vomiting.  He states his symptoms started on the morning of 03/19/2021.  He has a history of heavy alcohol abuse and states that he would drink a 12 pack of beer daily but started cutting back and states that he only drinks 1 beer a day now.  Last alcohol was on the evening of 03/18/2021 when he drank 1 beer.  He woke up on the 27th feeling weak and having a fever 102.  He also had some mild diarrhea and started vomiting a few times.  Patient then started exhibiting tremors of the extremities and felt extremely weak and was not able to stand without assistance.  He lives with his mother and smokes a pack of cigarettes daily.  He was brought to the ED and received multiple doses of Ativan and continues to self some tremors.  He has been initiated on Librium taper and feels better today than he did yesterday.  He stated that he started hearing voices of his mother who is not here with him.  The ER staff tried to ambulate the patient but he is unable to stand due to his weakness and his tremulousness.  Hospitalist was called for further management and admission and currently patient is being treated for the following but not limited too:  Alcohol withdrawal with heavy alcohol abuse -Mr. Falletta is admitted to MedSurg floor.  He is placed on CIWA protocol for alcohol withdrawal.   -IV Lorazepam will be provided scheduled and as needed for elevated CIWA scores.   -We will need to monitor for  hallucinations -Patient has been started on Librium in the emergency room and will continue with the Librium maintenance dosing.   -Multivitamin, thiamine and folic acid ordered for patient. -Consult social work to assist patient with finding alcohol withdrawal programs -Continue symptomatic treatment and provide ondansetron 4 mg p.o./IV every 6 hours as needed for nausea -Ensure CIWA score is less than 5 consistently prior to discharge -Continue with IV fluid hydration with normal saline at 100 mL's per hour  Abnormal LFTs -Patient's AST was 121 on admission and trended down to 89 -Patient's ALT was 121 and trended down to 101 In the setting of his alcohol abuse CIWA continue monitor and will obtain a right upper quad ultrasound and a acute hepatitis panel if LFTs fail to improve or still remain elevated Repeat CT NP in a.m.  Lactic Acidosis  Elevated anion gap metabolic acidosis -In the setting of his withdrawal and nausea vomiting -Patient presented with a CO2 of 21, anion gap of 17, chloride level 101; now his CO2 is 24, anion gap is 13, chloride level is 99 -Presented with a lactic acid of 4.1 and repeat i was 2.3; I have repeated it now and will continue with IV fluid hydration with normal saline at 100 mL's per hour; he received a 1 L bolus of lactated Ringer's in the emergency room -continue monitor and trend lactic acid until it normalizes  Thrombocytopenia -In the setting of his alcoholism -Platelet  count was 78 on admission and repeat is now 69 -Continue to monitor for signs and symptoms of bleeding; currently no overt bleeding noted -Repeat CBC in a.m.  Tobacco Abuse -Smoking cessation counseling given  -Continue with nicotine patch 14 mg transdermally every 24 hours  Leukopenia -Mild and likely reactive  -Continue to monitor for signs and symptoms of infection  -Repeat CBC in a.m.   Depression and Anxiety -Continue home dose of sertraline 50 mg p.o. nightly.   -He  denies any worsening of his depression or suicidal ideation -Continue to Monitor carefully  We will continue to monitor the patient's clinical response to intervention and repeat blood work in the a.m. and continue to monitor his withdrawal symptoms.

## 2021-03-20 NOTE — H&P (Signed)
History and Physical    Draylen Lobue QVZ:[id number] DOB: [birth year] DOA: 03/19/2021  PCP: Patient, No Pcp Per   Patient coming from: Home  Chief Complaint: Tremors and vomiting  HPI: John Vaughan is a 30 y.o. male with medical history significant for Alcohol abuse, depression who presents with complaint of fatigue, tremors and vomiting. Symptoms started on morning of 03/19/21. He has hx of heavy alcohol use stating he used to drink 24-30 beers a day but has been cutting back and only having 1-2 beers a day recently. He last had alcohol on evening of 03/18/21 when he drank one beer he reports.  He woke up on 03/19/21 feeling weak and having a fever of 102. He had mild diarrhea and vomited a few times at home.  He started having tremors of extremities and felt very weak. He was not able to stand without assistance. He denies abdominal pain, chest pain, SOB, cough, dysuria.  He lives with his mother. He smokes one pack of cigarettes a day.  He denies illicit drug use  ED Course: He has been hemodynamically stable in the emergency room.  He is received multiple doses of Ativan as well as Librium but continues to have tremors and does not feel well.  He states he has heard voices of his mother who is not here with him.  ER staff tried to ambulate patient but he was unable to stand up due to weakness and was very tremulous.  Hospitalist service was asked to admit for further management  Review of Systems:  General: Reports fatigue and generalized weakness. Denies weight loss, night sweats.  Reports decreased appetite HENT: Denies head trauma, headache, denies change in hearing, tinnitus.  Denies nasal congestion or bleeding.  Denies sore throat, sores in mouth.  Denies difficulty swallowing Eyes: Denies blurry vision, pain in eye, drainage.  Denies discoloration of eyes. Neck: Denies pain.  Denies swelling.  Denies pain with movement. Cardiovascular: Denies chest pain, palpitations.   Denies edema.  Denies orthopnea Respiratory: Denies shortness of breath, cough.  Denies wheezing.  Denies sputum production Gastrointestinal: Denies abdominal pain, swelling. Denies melena.  Denies hematemesis. Musculoskeletal: Denies limitation of movement.  Denies deformity or swelling.  Denies pain.   Genitourinary: Denies pelvic pain.  Denies urinary frequency or hesitancy.  Denies dysuria.  Skin: Denies rash.  Denies petechiae, purpura, ecchymosis. Neurological: Denies headache.  Denies syncope.  Denies seizure activity. Denies paresthesia. Denies slurred speech, drooping face.  Denies visual change. Psychiatric: Denies depression, anxiety. Denies suicidal thoughts or ideation. Denies hallucinations.  Past Medical History:  Diagnosis Date  . Alcohol abuse   . Anxiety   . Depression     History reviewed. No pertinent surgical history.  Social History  reports that he has been smoking cigarettes. He has been smoking about 1.00 pack per day. He has quit using smokeless tobacco.  His smokeless tobacco use included snuff. He reports current alcohol use of about 18.0 standard drinks of alcohol per week. He reports previous drug use. Drug: Marijuana.  No Known Allergies  Family History  Problem Relation Age of Onset  . Anxiety disorder Mother   . Alcohol abuse Father   . Anxiety disorder Maternal Aunt   . Anxiety disorder Maternal Grandmother   . Alcohol abuse Paternal Grandfather      Prior to Admission medications   Medication Sig Start Date End Date Taking? Authorizing Provider  clonazePAM (KLONOPIN) 0.5 MG tablet Take 1 tablet (0.5 mg total) by mouth  2 (two) times daily as needed for anxiety. Patient taking differently: Take 1 mg by mouth daily as needed for anxiety. 04/22/20  Yes Henderly, Britni A, PA-C  gabapentin (NEURONTIN) 300 MG capsule Take 900 mg by mouth 3 (three) times daily.  04/14/20  Yes [provider]  Multiple Vitamins-Minerals (MULTIVITAMIN ADULTS PO)  Take 1 tablet by mouth daily.   Yes [provider]  ondansetron (ZOFRAN) 4 MG tablet Take 1 tablet (4 mg total) by mouth every 6 (six) hours. 05/27/20  Yes Carroll Sage, PA-C  pantoprazole (PROTONIX) 40 MG tablet Take 40 mg by mouth daily as needed (heartburn). 12/08/20  Yes [provider]  sertraline (ZOLOFT) 50 MG tablet Take 50 mg by mouth daily. 06/09/20  Yes [provider]  chlordiazePOXIDE (LIBRIUM) 25 MG capsule 50mg  PO TID x 1D, then 25 mg PO BID X 1D, then 25 mg PO QD X 2D 03/19/21   03/21/21, MD    Physical Exam: Vitals:   03/19/21 2341 03/19/21 2355 03/20/21 0054 03/20/21 0056  BP: 118/86 120/68 121/83 121/83  Pulse: 95 88  (!) 103  Resp:  16  20  Temp:      TempSrc:      SpO2: 98% 98%  100%  Weight:      Height:        Constitutional: NAD, calm, comfortable Vitals:   03/19/21 2341 03/19/21 2355 03/20/21 0054 03/20/21 0056  BP: 118/86 120/68 121/83 121/83  Pulse: 95 88  (!) 103  Resp:  16  20  Temp:      TempSrc:      SpO2: 98% 98%  100%  Weight:      Height:       General: WDWN, Alert and oriented x3.  Eyes: EOMI, PERRL, conjunctivae normal.  Sclera nonicteric HENT:  [place]/AT, external ears normal.  Nares patent without epistasis.  Mucous membranes are moist.  Neck: Soft, normal range of motion, supple, no masses, no thyromegaly. Trachea midline Respiratory: clear to auscultation bilaterally, no wheezing, no crackles. Normal respiratory effort. No accessory muscle use.  Cardiovascular: Regular rate and rhythm, no murmurs / rubs / gallops. No extremity edema.  Abdomen: Soft, no tenderness, nondistended, no rebound or guarding.  No masses palpated. Bowel sounds normoactive Musculoskeletal: FROM. no cyanosis. No joint deformity upper and lower extremities. Normal muscle tone.  Skin: Warm, dry, intact no rashes, lesions, ulcers. No induration Neurologic: CN 2-12 grossly intact. Normal speech. Sensation intact, Strength 5/5 in all  extremities.   Psychiatric: Normal judgment and insight.  depressed mood with flat affect.    Labs on Admission: I have personally reviewed following labs and imaging studies  CBC: Recent Labs  Lab 03/19/21 1937  WBC 4.6  NEUTROABS 3.5  HGB 14.5  HCT 42.5  MCV 100.5*  PLT 78*    Basic Metabolic Panel: Recent Labs  Lab 03/19/21 1937  NA 139  K 4.2  CL 101  CO2 21*  GLUCOSE 90  BUN 7  CREATININE 0.73  CALCIUM 9.6  MG 1.9    GFR: Estimated Creatinine Clearance: 152.2 mL/min (by C-G formula based on SCr of 0.73 mg/dL).  Liver Function Tests: Recent Labs  Lab 03/19/21 1937  AST 121*  ALT 121*  ALKPHOS 105  BILITOT 1.0  PROT 8.3*  ALBUMIN 4.5    Urine analysis:    Component Value Date/Time   COLORURINE YELLOW 03/20/2021 0016   APPEARANCEUR CLEAR 03/20/2021 0016   LABSPEC 1.018 03/20/2021 0016  PHURINE 5.0 03/20/2021 0016   GLUCOSEU NEGATIVE 03/20/2021 0016   HGBUR NEGATIVE 03/20/2021 0016   BILIRUBINUR NEGATIVE 03/20/2021 0016   KETONESUR 20 (A) 03/20/2021 0016   PROTEINUR 30 (A) 03/20/2021 0016   NITRITE NEGATIVE 03/20/2021 0016   LEUKOCYTESUR NEGATIVE 03/20/2021 0016    Radiological Exams on Admission: DG Chest Port 1 View  Result Date: 03/19/2021 CLINICAL DATA:  Questionable sepsis - evaluate for abnormality Chest pain and fever. EXAM: PORTABLE CHEST 1 VIEW COMPARISON:  03/02/2021 radiograph and CT FINDINGS: The cardiomediastinal contours are normal. The lungs are clear. Pulmonary vasculature is normal. No consolidation, pleural effusion, or pneumothorax. No acute osseous abnormalities are seen. Thoracic compression fractures are not well seen by AP chest radiograph. IMPRESSION: No acute chest findings. Electronically Signed   By: Narda Rutherford M.D.   On: 03/19/2021 19:37    EKG: Independently reviewed.  EKG shows normal sinus rhythm with no acute ST elevation or depression.  QTc is 465  Assessment/Plan Active Problems:   Alcohol withdrawal   Mr. Kibbe is admitted to MedSurg floor.  He is placed on CIWA protocol for alcohol withdrawal.  Ativan will be provided as needed for elevated CIWA scores.  Patient has been started on Librium in the emergency room and will continue with the Librium maintenance dosing.  Multivitamin, thiamine and folic acid ordered for patient. Consult social work to assist patient with finding alcohol withdrawal programs    Depression Continue home dose of Zoloft.  He denies any worsening of his depression or suicidal ideation    DVT prophylaxis: Lovenox for DVT prophylaxis as patient has had decreased mobility for the last 3 days.  Code Status:   Full code Family Communication:  Diagnosis and plan discussed with patient.  Patient is agreement with plan.  Questions answered.  Further recommendations to follow as clinical indicated Disposition Plan:   Patient is from:  Home  Anticipated DC to:  Home  Anticipated DC date:  Anticipate more than 2 midnight stay in the hospital  Anticipated DC barriers: No barriers to discharge identified at this time  Admission status:  Inpatient   Claudean Severance Chotiner MD [facility]  How to contact the [facility] Attending or Consulting provider 7A - 7P or covering provider during after hours 7P -7A, for this patient?   1. Check the care team in [facility] and look for a) attending/consulting [facility] provider listed and b) the [facility] team listed 2. Log into [email or web address] and use [place]'s universal password to access. If you do not have the password, please contact the hospital operator. 3. Locate the [facility] provider you are looking for under [facility] and page to a number that you can be directly reached. 4. If you still have difficulty reaching the provider, please page the [facility] (Director on Call) for the Hospitalists listed on amion for assistance.  03/20/2021, 1:41 AM

## 2021-03-20 NOTE — Progress Notes (Signed)
Patient mother whom he lives with called with concern for his mental health. She said, he has been very depressed and attempted to harm himself with knife recently. The patient currently denies suicidal ideation, or any previous attempts to harm himself, but does admit to being depressed. MD was notified

## 2021-03-20 NOTE — Progress Notes (Signed)
Pt's pocket knife given to security to keep while pt is in our care.

## 2021-03-21 DIAGNOSIS — E876 Hypokalemia: Secondary | ICD-10-CM

## 2021-03-21 DIAGNOSIS — F32A Depression, unspecified: Secondary | ICD-10-CM

## 2021-03-21 DIAGNOSIS — F10231 Alcohol dependence with withdrawal delirium: Principal | ICD-10-CM

## 2021-03-21 LAB — COMPREHENSIVE METABOLIC PANEL
ALT: 82 U/L — ABNORMAL HIGH (ref 0–44)
AST: 66 U/L — ABNORMAL HIGH (ref 15–41)
Albumin: 4.2 g/dL (ref 3.5–5.0)
Alkaline Phosphatase: 90 U/L (ref 38–126)
Anion gap: 9 (ref 5–15)
BUN: <5 mg/dL — ABNORMAL LOW (ref 6–20)
CO2: 23 mmol/L (ref 22–32)
Calcium: 9.2 mg/dL (ref 8.9–10.3)
Chloride: 104 mmol/L (ref 98–111)
Creatinine, Ser: 0.75 mg/dL (ref 0.61–1.24)
GFR, Estimated: >60 mL/min (ref >60)
Glucose, Bld: 118 mg/dL — ABNORMAL HIGH (ref 70–99)
Potassium: 3.3 mmol/L — ABNORMAL LOW (ref 3.5–5.1)
Sodium: 136 mmol/L (ref 135–145)
Total Bilirubin: 0.9 mg/dL (ref 0.3–1.2)
Total Protein: 7.4 g/dL (ref 6.5–8.1)

## 2021-03-21 LAB — CBC WITH DIFFERENTIAL/PLATELET
Abs Immature Granulocytes: 0.02 10*3/uL (ref 0.00–0.07)
Basophils Absolute: 0 10*3/uL (ref 0.0–0.1)
Basophils Relative: 1 %
Eosinophils Absolute: 0 10*3/uL (ref 0.0–0.5)
Eosinophils Relative: 1 %
HCT: 38.7 % — ABNORMAL LOW (ref 39.0–52.0)
Hemoglobin: 13.3 g/dL (ref 13.0–17.0)
Immature Granulocytes: 1 %
Lymphocytes Relative: 12 %
Lymphs Abs: 0.5 10*3/uL — ABNORMAL LOW (ref 0.7–4.0)
MCH: 34.1 pg — ABNORMAL HIGH (ref 26.0–34.0)
MCHC: 34.4 g/dL (ref 30.0–36.0)
MCV: 99.2 fL (ref 80.0–100.0)
Monocytes Absolute: 0.8 10*3/uL (ref 0.1–1.0)
Monocytes Relative: 19 %
Neutro Abs: 2.9 10*3/uL (ref 1.7–7.7)
Neutrophils Relative %: 66 %
Platelets: 64 10*3/uL — ABNORMAL LOW (ref 150–400)
RBC: 3.9 MIL/uL — ABNORMAL LOW (ref 4.22–5.81)
RDW: 13.1 % (ref 11.5–15.5)
WBC: 4.3 10*3/uL (ref 4.0–10.5)
nRBC: 0 % (ref 0.0–0.2)

## 2021-03-21 LAB — URINE CULTURE: Culture: <10000 — AB

## 2021-03-21 LAB — MAGNESIUM: Magnesium: 1.8 mg/dL (ref 1.7–2.4)

## 2021-03-21 LAB — PHOSPHORUS: Phosphorus: 3.1 mg/dL (ref 2.5–4.6)

## 2021-03-21 LAB — HEPATITIS PANEL, ACUTE
HCV Ab: NONREACTIVE
Hep A IgM: NONREACTIVE
Hep B C IgM: NONREACTIVE
Hepatitis B Surface Ag: NONREACTIVE

## 2021-03-21 LAB — MRSA PCR SCREENING: MRSA by PCR: NEGATIVE

## 2021-03-21 MED ORDER — DEXMEDETOMIDINE HCL IN NACL 200 MCG/50ML IV SOLN
0.4000 ug/kg/h | INTRAVENOUS | Status: DC
  Administered 2021-03-21: 0.4 ug/kg/h via INTRAVENOUS
  Filled 2021-03-21: qty 50

## 2021-03-21 MED ORDER — ORAL CARE MOUTH RINSE
15.0000 mL | Freq: Two times a day (BID) | OROMUCOSAL | Status: DC
  Administered 2021-03-21 – 2021-03-22 (×4): 15 mL via OROMUCOSAL

## 2021-03-21 MED ORDER — LORAZEPAM 2 MG/ML IJ SOLN
2.0000 mg | Freq: Once | INTRAMUSCULAR | Status: AC
  Administered 2021-03-21: 2 mg via INTRAVENOUS

## 2021-03-21 MED ORDER — DEXMEDETOMIDINE HCL IN NACL 200 MCG/50ML IV SOLN: 0.2000 ug/kg/h | INTRAVENOUS | Status: DC

## 2021-03-21 MED ORDER — ZOLPIDEM TARTRATE 5 MG PO TABS
10.0000 mg | ORAL_TABLET | Freq: Once | ORAL | Status: AC
  Administered 2021-03-21: 10 mg via ORAL
  Filled 2021-03-21: qty 2

## 2021-03-21 MED ORDER — POTASSIUM CHLORIDE 10 MEQ/100ML IV SOLN
10.0000 meq | INTRAVENOUS | Status: AC
  Administered 2021-03-21 (×4): 10 meq via INTRAVENOUS
  Filled 2021-03-21 (×3): qty 100

## 2021-03-21 MED ORDER — MAGNESIUM SULFATE 2 GM/50ML IV SOLN
2.0000 g | Freq: Once | INTRAVENOUS | Status: AC
  Administered 2021-03-21: 2 g via INTRAVENOUS
  Filled 2021-03-21: qty 50

## 2021-03-21 MED ORDER — QUETIAPINE FUMARATE 25 MG PO TABS
50.0000 mg | ORAL_TABLET | Freq: Once | ORAL | Status: AC
  Administered 2021-03-21: 50 mg via ORAL
  Filled 2021-03-21: qty 2

## 2021-03-21 MED ORDER — PANTOPRAZOLE SODIUM 40 MG IV SOLR
40.0000 mg | Freq: Every day | INTRAVENOUS | Status: DC
  Administered 2021-03-21: 40 mg via INTRAVENOUS
  Filled 2021-03-21: qty 40

## 2021-03-21 MED ORDER — POTASSIUM CHLORIDE CRYS ER 20 MEQ PO TBCR
40.0000 meq | EXTENDED_RELEASE_TABLET | Freq: Once | ORAL | Status: AC
  Administered 2021-03-21: 40 meq via ORAL
  Filled 2021-03-21: qty 2

## 2021-03-21 MED ORDER — CHLORHEXIDINE GLUCONATE CLOTH 2 % EX PADS
6.0000 | MEDICATED_PAD | Freq: Every day | CUTANEOUS | Status: DC
  Administered 2021-03-21: 6 via TOPICAL

## 2021-03-21 MED ORDER — DEXMEDETOMIDINE HCL IN NACL 400 MCG/100ML IV SOLN
0.2000 ug/kg/h | INTRAVENOUS | Status: DC
  Administered 2021-03-21: 0.9 ug/kg/h via INTRAVENOUS
  Administered 2021-03-21: 1.4 ug/kg/h via INTRAVENOUS
  Administered 2021-03-21: 0.5 ug/kg/h via INTRAVENOUS
  Administered 2021-03-21: 1.4 ug/kg/h via INTRAVENOUS
  Administered 2021-03-22: 0.4 ug/kg/h via INTRAVENOUS
  Filled 2021-03-21 (×5): qty 100

## 2021-03-21 NOTE — Progress Notes (Signed)
PROGRESS NOTE    Lovie Agresta  AJO:[id number] DOB: 26-Apr-1991 DOA: 03/19/2021 PCP: Patient, No Pcp Per   Brief Narrative:  The patient is a 30 year old slightly disheveled Caucasian male with a past medical history significant for but not limited to alcohol abuse, anxiety and depression, as well as other comorbidities who presented with a chief complaint of fatigue, tremors and vomiting.  He states his symptoms started on the morning of 03/19/2021.  He has a history of heavy alcohol abuse and states that he would drink a 12 pack of beer daily but started cutting back and states that he only drinks 1 beer a day now.  Last alcohol was on the evening of 03/18/2021 when he drank 1 beer.  He woke up on the 27th feeling weak and having a fever 102.  He also had some mild diarrhea and started vomiting a few times.  Patient then started exhibiting tremors of the extremities and felt extremely weak and was not able to stand without assistance.  He lives with his mother and smokes a pack of cigarettes daily.  He was brought to the ED and received multiple doses of Ativan and continues to self some tremors.  He has been initiated on Librium taper and feels better today than he did yesterday.  He stated that he started hearing voices of his mother who is not here with him.  The ER staff tried to ambulate the patient but he is unable to stand due to his weakness and his tremulousness.  Hospitalist was called for further management and admission and overnight the patient decompensated further and his CIWA scores were extremely high.  The range above 20 and this morning it was 38 and he was extremely agitated and hallucinating.  Because of concern he was given 6 mg of IV Ativan and transferred to the stepdown unit for Precedex drip.  I spoke with Dr. Delton Coombes who evaluated and he will take him on his service until the patient is off the Precedex drip.  Patient is extremely agitated and he was [name]'d given concern for  safety of himself  Assessment & Plan:   Active Problems:   Alcohol withdrawal (HCC)   Depression Alcohol withdrawal with heavy alcohol abuse -Mr. Spink is admitted to MedSurg floor. He is placed on CIWA protocol for alcohol withdrawal.  -IV Lorazepam will be provided scheduled and as needed for elevated CIWA scores.  -We will need to monitor for hallucinations and he was extremely agitated and was hallucinating today -Patient has been started on Librium in the emergency room and will continue with the Librium maintenance dosing.  -Multivitamin, thiamine and folic acid ordered for patient. -Consult social work to assist patient with finding alcohol withdrawal programs -Continue symptomatic treatment and provide ondansetron 4 mg p.o./IV every 6 hours as needed for nausea -Ensure CIWA score is less than 5 consistently prior to discharge -Continue with IV fluid hydration with normal saline at 100 mL's per hour -Because the patient clinically decompensated with auditory hallucinations, continue tremors, progressive agitation as well as delirium and was unable to be managed on the floor he was transferred to stepdown unit and he was involuntarily committed given his concern for himself and he was placed on a Precedex drip and pulmonary consulted -Safety sitter is at bedside and he was in monitor recommended -Because he is now on a Precedex infusion and weaning is scheduled Ativan has been discontinued to determine sedation needs and Lyrica use as needed Ativan for severe  agitation and transition back to the Ativan via CIWA scoring once he is stabilized  Abnormal LFTs, slowly improving -Patient's AST was 121 on admission and trended down to 89 yesterday and today it is 66 -Patient's ALT was 121 and trended down to 101 yesterday and today it is 82 In the setting of his alcohol abuse CIWA continue monitor and will obtain a right upper quad ultrasound and a acute hepatitis panel if LFTs fail to  improve or still remain elevated; pulmonary has already checking a hepatitis panel and recommending considering a right upper quadrant ultrasound when his acute issues stabilize Repeat CT NP in a.m.  Lactic Acidosis, resolved Elevated anion gap metabolic acidosis, improved -In the setting of his withdrawal and nausea vomiting -Patient presented with a CO2 of 21, anion gap of 17, chloride level 101; now his CO2 is 23, anion gap is 9, chloride level is 104 -Presented with a lactic acid of 4.1 and repeat i was 2.3; I have repeated it now and will continue with IV fluid hydration with normal saline at 100 mL's per hour; he received a 1 L bolus of lactated Ringer's in the emergency room -Lactic acid level has resolved on last check was 0.7  Hypokalemia -Patient's potassium this morning was 3.3 -Replete with IV KCl 40 mill colons -Continue monitor and replete as necessary -Magnesium was 1.8 so will replete with IV mag sulfate 2 g  -Repeat CMP in a.m.   Thrombocytopenia, continues to worsen -In the setting of his alcoholism -Platelet count was 78 on admission and repeat is now 35 yesterday and today 64 -Continue to monitor for signs and symptoms of bleeding; currently no overt bleeding noted -Repeat CBC in a.m.  Tobacco Abuse -Smoking cessation counseling given  -Continue with nicotine patch 14 mg transdermally every 24 hours but he refused this morning  Leukopenia -Mild and likely reactive and is improved as WBC went from 3.9 is now 4.3 -Continue to monitor for signs and symptoms of infection  -Repeat CBC in a.m.   Depression and Anxiety -Continue home dose of sertraline 50 mg p.o. nightly.  -He denies any worsening of his depression or suicidal ideation but mother was concerned yesterday that his father hurt himself with a knife so we have consulted psychiatry -Continue to Monitor carefully and he has been involuntary committed  DVT prophylaxis: SCDs and Enoxaparin 40 mg subcu  every 24 Code Status: FULL CODE Family Communication: No family present at bedside  Disposition Plan: Pending further clearance and improvement and transition off of the Precedex drip and out of the ICU  Status is: Inpatient  Remains inpatient appropriate because:Unsafe d/c plan, IV treatments appropriate due to intensity of illness or inability to take PO and Inpatient level of care appropriate due to severity of illness   Dispo: The patient is from: Home              Anticipated d/c is to: TBD              Patient currently is not medically stable to d/c.   Difficult to place patient No  Consultants:   PCCM/Pulmonary   Procedures: None  Antimicrobials:  Anti-infectives (From admission, onward)   None        Subjective: Seen and examined this morning he is extremely agitated and had security called on him.  Nursing patient stated that his CIWA score was above 30 and was 38.  He had just received scheduled Librium as well as scheduled Ativan  and was given 6 additional milligrams of IV Ativan without little to no response.  Patient continued to have auditory hallucinations, agitation and tremors transferred to the stepdown unit for further care and Precedex drip.  Objective: Vitals:   03/20/21 1557 03/20/21 2233 03/21/21 0556 03/21/21 0900  BP: 116/80 120/85 (!) 122/99 115/84  Pulse: 72 89 90 94  Resp: (!) 22 18 18 14   Temp: 98.5 F (36.9 C) 98.3 F (36.8 C) 98.4 F (36.9 C) 97.9 F (36.6 C)  TempSrc: Oral Oral Oral Oral  SpO2: 98% 99% 100% 98%  Weight:      Height:        Intake/Output Summary (Last 24 hours) at 03/21/2021 0959 Last data filed at 03/21/2021 [id number] Gross per 24 hour  Intake 4395.72 ml  Output --  Net 4395.72 ml   Filed Weights   03/19/21 1955  Weight: 79.4 kg   Examination: Physical Exam:  Constitutional: WN/WD Caucasian male who is extremely agitated and delirious  Eyes: Lids and conjunctivae normal, sclerae anicteric but pupils are  extremely dilated ENMT: External Ears, Nose appear normal. Grossly normal hearing.  Neck: Appears normal, supple, no cervical masses, normal ROM, no appreciable thyromegaly; no JVD Respiratory: Diminished to auscultation bilaterally, no wheezing, rales, rhonchi or crackles. Normal respiratory effort and patient is not tachypenic. No accessory muscle use.  Cardiovascular: RRR, no murmurs / rubs / gallops. S1 and S2 auscultated.  No appreciable extremity edema Abdomen: Soft, non-tender, slightly distended.Bowel sounds positive.  GU: Deferred. Musculoskeletal: No clubbing / cyanosis of digits/nails. No joint deformity upper and lower extremities.  Skin: No rashes, lesions, ulcers on limited skin evaluation but does have some facial acne. No induration; Warm and dry.  Neurologic: CN 2-12 grossly intact with no focal deficits.  Romberg sign and cerebellar reflexes not assessed.  Psychiatric: Impaired judgment and insight.  He is awake but he is not fully alert and oriented x 3 and continues to hallucinate and states that he "does not want to go to the hospital".  Extremely agitated mood and combative affect  Data Reviewed: I have personally reviewed following labs and imaging studies  CBC: Recent Labs  Lab 03/19/21 1937 03/20/21 0437 03/21/21 0351  WBC 4.6 3.9* 4.3  NEUTROABS 3.5  --  2.9  HGB 14.5 13.2 13.3  HCT 42.5 38.8* 38.7*  MCV 100.5* 98.5 99.2  PLT 78* 67* 64*   Basic Metabolic Panel: Recent Labs  Lab 03/19/21 1937 03/20/21 0437 03/21/21 0351  NA 139 136 136  K 4.2 3.6 3.3*  CL 101 99 104  CO2 21* 24 23  GLUCOSE 90 79 118*  BUN 7 7 <5*  CREATININE 0.73 0.75 0.75  CALCIUM 9.6 9.5 9.2  MG 1.9 1.8 1.8  PHOS  --   --  3.1   GFR: Estimated Creatinine Clearance: 152.2 mL/min (by C-G formula based on SCr of 0.75 mg/dL). Liver Function Tests: Recent Labs  Lab 03/19/21 1937 03/20/21 0437 03/21/21 0351  AST 121* 89* 66*  ALT 121* 101* 82*  ALKPHOS 105 94 90  BILITOT  1.0 1.2 0.9  PROT 8.3* 7.5 7.4  ALBUMIN 4.5 4.3 4.2   No results for input(s): LIPASE, AMYLASE in the last 168 hours. No results for input(s): AMMONIA in the last 168 hours. Coagulation Profile: Recent Labs  Lab 03/19/21 1937  INR 0.9   Cardiac Enzymes: No results for input(s): CKTOTAL, CKMB, CKMBINDEX, TROPONINI in the last 168 hours. BNP (last 3 results) No results for input(s):  PROBNP in the last 8760 hours. HbA1C: No results for input(s): HGBA1C in the last 72 hours. CBG: No results for input(s): GLUCAP in the last 168 hours. Lipid Profile: No results for input(s): CHOL, HDL, LDLCALC, TRIG, CHOLHDL, LDLDIRECT in the last 72 hours. Thyroid Function Tests: No results for input(s): TSH, T4TOTAL, FREET4, T3FREE, THYROIDAB in the last 72 hours. Anemia Panel: No results for input(s): VITAMINB12, FOLATE, FERRITIN, TIBC, IRON, RETICCTPCT in the last 72 hours. Sepsis Labs: Recent Labs  Lab 03/19/21 1937 03/19/21 2108 03/20/21 1022 03/20/21 1231  LATICACIDVEN 4.1* 2.3* 1.1 0.7    Recent Results (from the past 240 hour(s))  Resp Panel by RT-PCR (Flu A&B, Covid) Nasopharyngeal Swab     Status: None   Collection Time: 03/19/21  7:37 PM   Specimen: Nasopharyngeal Swab; Nasopharyngeal(NP) swabs in vial transport medium  Result Value Ref Range Status   SARS Coronavirus 2 by RT PCR NEGATIVE NEGATIVE Final    Comment: (NOTE) SARS-CoV-2 target nucleic acids are NOT DETECTED.  The SARS-CoV-2 RNA is generally detectable in upper respiratory specimens during the acute phase of infection. The lowest concentration of SARS-CoV-2 viral copies this assay can detect is 138 copies/mL. A negative result does not preclude SARS-Cov-2 infection and should not be used as the sole basis for treatment or other patient management decisions. A negative result may occur with  improper specimen collection/handling, submission of specimen other than nasopharyngeal swab, presence of viral mutation(s)  within the areas targeted by this assay, and inadequate number of viral copies(<138 copies/mL). A negative result must be combined with clinical observations, patient history, and epidemiological information. The expected result is Negative.  Fact Sheet for Patients:  BloggerCourse.com  Fact Sheet for Healthcare Providers:  SeriousBroker.it  This test is no t yet approved or cleared by the [place] FDA and  has been authorized for detection and/or diagnosis of SARS-CoV-2 by FDA under an Emergency Use Authorization (EUA). This EUA will remain  in effect (meaning this test can be used) for the duration of the COVID-19 declaration under Section 564(b)(1) of the Act, 21 U.S.C.section 360bbb-3(b)(1), unless the authorization is terminated  or revoked sooner.       Influenza A by PCR NEGATIVE NEGATIVE Final   Influenza B by PCR NEGATIVE NEGATIVE Final    Comment: (NOTE) The Xpert Xpress SARS-CoV-2/FLU/RSV plus assay is intended as an aid in the diagnosis of influenza from Nasopharyngeal swab specimens and should not be used as a sole basis for treatment. Nasal washings and aspirates are unacceptable for Xpert Xpress SARS-CoV-2/FLU/RSV testing.  Fact Sheet for Patients: BloggerCourse.com  Fact Sheet for Healthcare Providers: SeriousBroker.it  This test is not yet approved or cleared by the [place] FDA and has been authorized for detection and/or diagnosis of SARS-CoV-2 by FDA under an Emergency Use Authorization (EUA). This EUA will remain in effect (meaning this test can be used) for the duration of the COVID-19 declaration under Section 564(b)(1) of the Act, 21 U.S.C. section 360bbb-3(b)(1), unless the authorization is terminated or revoked.  Performed at [facility], [address], [place], [place] [address]   Blood Culture (routine x 2)      Status: None (Preliminary result)   Collection Time: 03/19/21  7:37 PM   Specimen: BLOOD  Result Value Ref Range Status   Specimen Description   Final    BLOOD SITE NOT SPECIFIED Performed at [facility], [address], [place], [place] [address]    Special Requests  Final    BOTTLES DRAWN AEROBIC AND ANAEROBIC Blood Culture adequate volume Performed at [facility], [address] [place], [place] [address]    Culture   Final    NO GROWTH 1 DAY Performed at [facility], [address], [place], [place] [address]    Report Status PENDING  Incomplete  Blood Culture (routine x 2)     Status: None (Preliminary result)   Collection Time: 03/19/21  7:56 PM   Specimen: BLOOD RIGHT HAND  Result Value Ref Range Status   Specimen Description   Final    BLOOD RIGHT HAND Performed at [facility], [address] [place], [place] [address]    Special Requests   Final    BOTTLES DRAWN AEROBIC AND ANAEROBIC Blood Culture adequate volume Performed at [facility], [address], [place], [place] [address]    Culture   Final    NO GROWTH 1 DAY Performed at [facility], [address], [place], [place] [address]    Report Status PENDING  Incomplete  Urine culture     Status: Abnormal   Collection Time: 03/20/21 12:16 AM   Specimen: In/Out Cath Urine  Result Value Ref Range Status   Specimen Description   Final    IN/OUT CATH URINE Performed at [facility], [address], [place], [place] [address]    Special Requests   Final    NONE Performed at [facility], [address], [place], [place] [address]    Culture (A)  Final    <10,000 COLONIES/mL INSIGNIFICANT GROWTH Performed at [facility], [address] [place], [place] [address]    Report Status 03/21/2021 FINAL  Final    RN Pressure Injury Documentation:     Estimated body mass index is 23.73 kg/m as  calculated from the following:   Height as of this encounter: 6' (1.829 m).   Weight as of this encounter: 79.4 kg.  Malnutrition Type:   Malnutrition Characteristics:   Nutrition Interventions:    Radiology Studies: DG Chest Port 1 View  Result Date: 03/19/2021 CLINICAL DATA:  Questionable sepsis - evaluate for abnormality Chest pain and fever. EXAM: PORTABLE CHEST 1 VIEW COMPARISON:  03/02/2021 radiograph and CT FINDINGS: The cardiomediastinal contours are normal. The lungs are clear. Pulmonary vasculature is normal. No consolidation, pleural effusion, or pneumothorax. No acute osseous abnormalities are seen. Thoracic compression fractures are not well seen by AP chest radiograph. IMPRESSION: No acute chest findings. Electronically Signed   By: Narda Rutherford M.D.   On: 03/19/2021 19:37   Scheduled Meds: . Chlorhexidine Gluconate Cloth  6 each Topical Daily  . enoxaparin (LOVENOX) injection  40 mg Subcutaneous Q24H  . folic acid  1 mg Oral Daily  . mouth rinse  15 mL Mouth Rinse BID  . multivitamin with minerals  1 tablet Oral Daily  . nicotine  14 mg Transdermal Daily  . pantoprazole (PROTONIX) IV  40 mg Intravenous QHS  . sertraline  50 mg Oral Daily  . thiamine  100 mg Oral Daily   Or  . thiamine  100 mg Intravenous Daily   Continuous Infusions: . sodium chloride 100 mL/hr at 03/21/21 0859  . dexmedetomidine (PRECEDEX) IV infusion    . magnesium sulfate bolus IVPB 50 mL/hr at 03/21/21 0955  . potassium chloride 100 mL/hr at 03/21/21 0955    LOS: 1 day   [facility],  DO [facility] PAGER is on AMION  If 7PM-7AM, please contact night-coverage [email or web address]

## 2021-03-21 NOTE — Progress Notes (Signed)
PT Cancellation Note  Patient Details Name: Logyn Kendrick MRN: [id number] DOB: 06-04-1991   Cancelled Treatment:    Reason Eval/Treat Not Completed: Medical issues which prohibited therapy (pt was very agitated this morning, moved to ICU for sedation. Is now [name]. Will follow.)   Tamala Ser PT 03/21/2021  Acute Rehabilitation Services Pager [phone] Office [phone]

## 2021-03-21 NOTE — Progress Notes (Signed)
Notified MD of pts last CIWA score of 20 and continued restlessness and anxiety despite all medication given. Pt has not slept all night. Pt now states he wants to leave to smoke. Offered pt nicotine patch but he declined. New order for Seroquel received and given.

## 2021-03-21 NOTE — Consult Note (Signed)
[facility] Face-to-Face Psychiatry Consult   Reason for Consult:  Depression  Referring Physician:  Self Harm Patient Identification: John Vaughan MRN:  [id number] Principal Diagnosis: <principal problem not specified> Diagnosis:  Active Problems:   Alcohol withdrawal (HCC)   Depression   Total Time spent with patient: 15 minutes  Subjective:   John Vaughan is a 30 y.o. male patient admitted with depression and alcohol abuse.  He is awake, alert and oriented x3.  John Vaughan is  presents irritable and agitated during this assessment.  Patient does admit to prior suicide at attempt by cutting his wrists after the passing of his father this October.  Currently he is denying suicidal or homicidal ideations.  Does admit to intermittent auditory hallucinations currently denying.  John Vaughan provided verbal permission to follow-up with his mother.  Discussed outpatient follow-up for substance abuse and depression.  Patient reported " I cannot afford medications or group."  Education provided with [facility] urgent care facility and the Chemical dependency program CD- IOP program.    Np spoke to [place] mother John Vaughan who reports concerns of patient's mood irritability, anger issues and intermittent ramblings. Stated patient "rambles about chickens"  reports patient has been struggling with a lot of stressors such as a break-up with a girlfriend and the loss of a job.  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  Does admit to patient having access to knives. However she stated "  I do not think he will do anything to hurt himself" discussed additional outpatient follow-up for [facility] urgent care facility.  Patient and mother was receptive to plan.  Patient to be cleared by psychiatry.  Case staffed with attending psychiatrist Lucianne Muss.  Support, encouragement and reassurance was provided.    HPI: Per initial admission assessment  note: 30 year old male with history of alcoholism, depression comes in a chief complaint of vomiting and tremors.Patient reports that he started feeling sick this morning.  His last alcoholic beverage was yesterday.  He woke up today feeling weak and having a fever of 102.  He has not drank today, and has started having some shakes.  He is already started cutting back on drinking and has been having tremors at baseline    Risk to Self:   Risk to Others:   Prior Inpatient Therapy:   Prior Outpatient Therapy:    Past Medical History:  Past Medical History:  Diagnosis Date  . Alcohol abuse   . Anxiety   . Depression    History reviewed. No pertinent surgical history. Family History:  Family History  Problem Relation Age of Onset  . Anxiety disorder Mother   . Alcohol abuse Father   . Anxiety disorder Maternal Aunt   . Anxiety disorder Maternal Grandmother   . Alcohol abuse Paternal Grandfather    Family Psychiatric  History:  Social History:  Social History   Substance and Sexual Activity  Alcohol Use Yes  . Alcohol/week: 18.0 standard drinks  . Types: 18 Cans of beer per week   Comment: states that he drinks 2-4 beers daily     Social History   Substance and Sexual Activity  Drug Use Not Currently  . Types: Marijuana    Social History   Socioeconomic History  . Marital status: Single    Spouse name: Not on file  . Number of children: Not on file  . Years of education: Not on file  . Highest education level:  Not on file  Occupational History  . Not on file  Tobacco Use  . Smoking status: Current Every Day Smoker    Packs/day: 1.00    Types: Cigarettes  . Smokeless tobacco: Former Neurosurgeon    Types: Snuff  Vaping Use  . Vaping Use: Never used  Substance and Sexual Activity  . Alcohol use: Yes    Alcohol/week: 18.0 standard drinks    Types: 18 Cans of beer per week    Comment: states that he drinks 2-4 beers daily  . Drug use: Not Currently    Types: Marijuana   . Sexual activity: Not Currently  Other Topics Concern  . Not on file  Social History Narrative  . Not on file   Social Determinants of Health   Financial Resource Strain: Not on file  Food Insecurity: Not on file  Transportation Needs: Not on file  Physical Activity: Not on file  Stress: Not on file  Social Connections: Not on file   Additional Social History:    Allergies:  No Known Allergies  Labs:  Results for orders placed or performed during the hospital encounter of 03/19/21 (from the past 48 hour(s))  Resp Panel by RT-PCR (Flu A&B, Covid) Nasopharyngeal Swab     Status: None   Collection Time: 03/19/21  7:37 PM   Specimen: Nasopharyngeal Swab; Nasopharyngeal(NP) swabs in vial transport medium  Result Value Ref Range   SARS Coronavirus 2 by RT PCR NEGATIVE NEGATIVE    Comment: (NOTE) SARS-CoV-2 target nucleic acids are NOT DETECTED.  The SARS-CoV-2 RNA is generally detectable in upper respiratory specimens during the acute phase of infection. The lowest concentration of SARS-CoV-2 viral copies this assay can detect is 138 copies/mL. A negative result does not preclude SARS-Cov-2 infection and should not be used as the sole basis for treatment or other patient management decisions. A negative result may occur with  improper specimen collection/handling, submission of specimen other than nasopharyngeal swab, presence of viral mutation(s) within the areas targeted by this assay, and inadequate number of viral copies(<138 copies/mL). A negative result must be combined with clinical observations, patient history, and epidemiological information. The expected result is Negative.  Fact Sheet for Patients:  BloggerCourse.com  Fact Sheet for Healthcare Providers:  SeriousBroker.it  This test is no t yet approved or cleared by the [place] FDA and  has been authorized for detection and/or diagnosis of SARS-CoV-2  by FDA under an Emergency Use Authorization (EUA). This EUA will remain  in effect (meaning this test can be used) for the duration of the COVID-19 declaration under Section 564(b)(1) of the Act, 21 U.S.C.section 360bbb-3(b)(1), unless the authorization is terminated  or revoked sooner.       Influenza A by PCR NEGATIVE NEGATIVE   Influenza B by PCR NEGATIVE NEGATIVE    Comment: (NOTE) The Xpert Xpress SARS-CoV-2/FLU/RSV plus assay is intended as an aid in the diagnosis of influenza from Nasopharyngeal swab specimens and should not be used as a sole basis for treatment. Nasal washings and aspirates are unacceptable for Xpert Xpress SARS-CoV-2/FLU/RSV testing.  Fact Sheet for Patients: BloggerCourse.com  Fact Sheet for Healthcare Providers: SeriousBroker.it  This test is not yet approved or cleared by the [place] FDA and has been authorized for detection and/or diagnosis of SARS-CoV-2 by FDA under an Emergency Use Authorization (EUA). This EUA will remain in effect (meaning this test can be used) for the duration of the COVID-19 declaration under Section 564(b)(1) of the Act, 21 U.S.C.  section 360bbb-3(b)(1), unless the authorization is terminated or revoked.  Performed at [facility], [address] [place], [place] [address]   Lactic acid, plasma     Status: Abnormal   Collection Time: 03/19/21  7:37 PM  Result Value Ref Range   Lactic Acid, Venous 4.1 (HH) 0.5 - 1.9 mmol/L    Comment: CRITICAL RESULT CALLED TO, READ BACK BY AND VERIFIED WITH: Juluis Pitch RN 03/19/21 @2030  BY P.HENDERSON Performed at [facility], [address], [place], [place]   Comprehensive metabolic panel     Status: Abnormal   Collection Time: 03/19/21  7:37 PM  Result Value Ref Range   Sodium 139 135 - 145 mmol/L   Potassium 4.2 3.5 - 5.1 mmol/L   Chloride 101 98 - 111 mmol/L   CO2 21 (L)  22 - 32 mmol/L   Glucose, Bld 90 70 - 99 mg/dL    Comment: Glucose reference range applies only to samples taken after fasting for at least 8 hours.   BUN 7 6 - 20 mg/dL   Creatinine, Ser 03/21/21 0.61 - 1.24 mg/dL   Calcium 9.6 8.9 - 5.95 mg/dL   Total Protein 8.3 (H) 6.5 - 8.1 g/dL   Albumin 4.5 3.5 - 5.0 g/dL   AST 63.8 (H) 15 - 41 U/L   ALT 121 (H) 0 - 44 U/L   Alkaline Phosphatase 105 38 - 126 U/L   Total Bilirubin 1.0 0.3 - 1.2 mg/dL   GFR, Estimated 756 >43 mL/min    Comment: (NOTE) Calculated using the CKD-EPI Creatinine Equation (2021)    Anion gap 17 (H) 5 - 15    Comment: Performed at [facility], [address], [place], [place]  CBC WITH DIFFERENTIAL     Status: Abnormal   Collection Time: 03/19/21  7:37 PM  Result Value Ref Range   WBC 4.6 4.0 - 10.5 K/uL   RBC 4.23 4.22 - 5.81 MIL/uL   Hemoglobin 14.5 13.0 - 17.0 g/dL   HCT 03/21/21 41.6 - 60.6 %   MCV 100.5 (H) 80.0 - 100.0 fL   MCH 34.3 (H) 26.0 - 34.0 pg   MCHC 34.1 30.0 - 36.0 g/dL   RDW 30.1 60.1 - 09.3 %   Platelets 78 (L) 150 - 400 K/uL    Comment: SPECIMEN CHECKED FOR CLOTS Immature Platelet Fraction may be clinically indicated, consider ordering this additional test 23.5 REPEATED TO VERIFY PLATELET COUNT CONFIRMED BY SMEAR    nRBC 0.0 0.0 - 0.2 %   Neutrophils Relative % 78 %   Neutro Abs 3.5 1.7 - 7.7 K/uL   Lymphocytes Relative 10 %   Lymphs Abs 0.5 (L) 0.7 - 4.0 K/uL   Monocytes Relative 11 %   Monocytes Absolute 0.5 0.1 - 1.0 K/uL   Eosinophils Relative 0 %   Eosinophils Absolute 0.0 0.0 - 0.5 K/uL   Basophils Relative 1 %   Basophils Absolute 0.0 0.0 - 0.1 K/uL   Immature Granulocytes 0 %   Abs Immature Granulocytes 0.02 0.00 - 0.07 K/uL    Comment: Performed at [facility], [address], [place], [place]  Protime-INR     Status: None   Collection Time: 03/19/21  7:37 PM  Result Value Ref Range   Prothrombin Time 12.1 11.4 -  15.2 seconds   INR 0.9 0.8 - 1.2    Comment: (NOTE) INR goal varies based on device and disease  states. Performed at [facility], [address] [place], [place] [address]   APTT     Status: None   Collection Time: 03/19/21  7:37 PM  Result Value Ref Range   aPTT 28 24 - 36 seconds    Comment: Performed at [facility], [address] [place], [place] [address]  Blood Culture (routine x 2)     Status: None (Preliminary result)   Collection Time: 03/19/21  7:37 PM   Specimen: BLOOD  Result Value Ref Range   Specimen Description      BLOOD SITE NOT SPECIFIED Performed at [facility], [address] [place], [place] [address]    Special Requests      BOTTLES DRAWN AEROBIC AND ANAEROBIC Blood Culture adequate volume Performed at [facility], [address] [place], [place] [address]    Culture      NO GROWTH 1 DAY Performed at [facility], [address] [place], [place] [address]    Report Status PENDING   Magnesium     Status: None   Collection Time: 03/19/21  7:37 PM  Result Value Ref Range   Magnesium 1.9 1.7 - 2.4 mg/dL    Comment: Performed at [facility], [address], [place], [place] [address]  Blood Culture (routine x 2)     Status: None (Preliminary result)   Collection Time: 03/19/21  7:56 PM   Specimen: BLOOD RIGHT HAND  Result Value Ref Range   Specimen Description      BLOOD RIGHT HAND Performed at [facility], [address], [place], [place] [address]    Special Requests      BOTTLES DRAWN AEROBIC AND ANAEROBIC Blood Culture adequate volume Performed at [facility], [address] [place], [place] [address]    Culture      NO GROWTH 1 DAY Performed at [facility], [address] [place], [place] [address]    Report Status PENDING   Lactic acid, plasma     Status: Abnormal   Collection Time: 03/19/21  9:08 PM  Result  Value Ref Range   Lactic Acid, Venous 2.3 (HH) 0.5 - 1.9 mmol/L    Comment: CRITICAL RESULT CALLED TO, READ BACK BY AND VERIFIED WITH: Juluis Pitch RN 03/37/22  BY P.HENDERSON Performed at [facility], [address], [place], [place] [address]   Urinalysis, Routine w reflex microscopic Urine, Clean Catch     Status: Abnormal   Collection Time: 03/20/21 12:16 AM  Result Value Ref Range   Color, Urine YELLOW YELLOW   APPearance CLEAR CLEAR   Specific Gravity, Urine 1.018 1.005 - 1.030   pH 5.0 5.0 - 8.0   Glucose, UA NEGATIVE NEGATIVE mg/dL   Hgb urine dipstick NEGATIVE NEGATIVE   Bilirubin Urine NEGATIVE NEGATIVE   Ketones, ur 20 (A) NEGATIVE mg/dL   Protein, ur 30 (A) NEGATIVE mg/dL   Nitrite NEGATIVE NEGATIVE   Leukocytes,Ua NEGATIVE NEGATIVE   RBC / HPF 0-5 0 - 5 RBC/hpf   WBC, UA 0-5 0 - 5 WBC/hpf   Bacteria, UA NONE SEEN NONE SEEN   Mucus PRESENT     Comment: Performed at [facility], [address] [place], [place] [address]  Urine culture     Status: Abnormal   Collection Time: 03/20/21 12:16 AM   Specimen: In/Out Cath Urine  Result Value Ref Range  Specimen Description      IN/OUT CATH URINE Performed at [facility], [address] [place], [place] [address]    Special Requests      NONE Performed at [facility], [address] [place], [place] [address]    Culture (A)     <10,000 COLONIES/mL INSIGNIFICANT GROWTH Performed at [facility], [address] [place], [place] [address]    Report Status 03/21/2021 FINAL   HIV Antibody (routine testing w rflx)     Status: None   Collection Time: 03/20/21  4:37 AM  Result Value Ref Range   HIV Screen 4th Generation wRfx Non Reactive Non Reactive    Comment: Performed at [facility], [address] [place], [place] [address]  CBC     Status: Abnormal   Collection Time: 03/20/21  4:37 AM  Result Value Ref Range   WBC  3.9 (L) 4.0 - 10.5 K/uL   RBC 3.94 (L) 4.22 - 5.81 MIL/uL   Hemoglobin 13.2 13.0 - 17.0 g/dL   HCT 57.838.8 (L) 46.939.0 - 62.952.0 %   MCV 98.5 80.0 - 100.0 fL   MCH 33.5 26.0 - 34.0 pg   MCHC 34.0 30.0 - 36.0 g/dL   RDW 52.813.1 41.311.5 - 24.415.5 %   Platelets 67 (L) 150 - 400 K/uL    Comment: Immature Platelet Fraction may be clinically indicated, consider ordering this additional test [id number] CONSISTENT WITH PREVIOUS RESULT REPEATED TO VERIFY    nRBC 0.0 0.0 - 0.2 %    Comment: Performed at [facility], [address] [place], [place] [address]  Comprehensive metabolic panel     Status: Abnormal   Collection Time: 03/20/21  4:37 AM  Result Value Ref Range   Sodium 136 135 - 145 mmol/L   Potassium 3.6 3.5 - 5.1 mmol/L   Chloride 99 98 - 111 mmol/L   CO2 24 22 - 32 mmol/L   Glucose, Bld 79 70 - 99 mg/dL    Comment: Glucose reference range applies only to samples taken after fasting for at least 8 hours.   BUN 7 6 - 20 mg/dL   Creatinine, Ser 3.470.75 0.61 - 1.24 mg/dL   Calcium 9.5 8.9 - 42.510.3 mg/dL   Total Protein 7.5 6.5 - 8.1 g/dL   Albumin 4.3 3.5 - 5.0 g/dL   AST 89 (H) 15 - 41 U/L   ALT 101 (H) 0 - 44 U/L   Alkaline Phosphatase 94 38 - 126 U/L   Total Bilirubin 1.2 0.3 - 1.2 mg/dL   GFR, Estimated >95>60 >63>60 mL/min    Comment: (NOTE) Calculated using the CKD-EPI Creatinine Equation (2021)    Anion gap 13 5 - 15    Comment: Performed at [facility], [address] [place], [place] [address]  Magnesium     Status: None   Collection Time: 03/20/21  4:37 AM  Result Value Ref Range   Magnesium 1.8 1.7 - 2.4 mg/dL    Comment: Performed at [facility], [address] [place], [place] [address]  Lactic acid, plasma     Status: None   Collection Time: 03/20/21 10:22 AM  Result Value Ref Range   Lactic Acid, Venous 1.1 0.5 - 1.9 mmol/L    Comment: Performed at [facility], [address] [place], [place] [address]   Lactic acid, plasma     Status: None   Collection Time: 03/20/21  12:31 PM  Result Value Ref Range   Lactic Acid, Venous 0.7 0.5 - 1.9 mmol/L    Comment: Performed at [facility], [address] [place], [place] [address]  CBC with Differential/Platelet     Status: Abnormal   Collection Time: 03/21/21  3:51 AM  Result Value Ref Range   WBC 4.3 4.0 - 10.5 K/uL   RBC 3.90 (L) 4.22 - 5.81 MIL/uL   Hemoglobin 13.3 13.0 - 17.0 g/dL   HCT 19.1 (L) 47.8 - 29.5 %   MCV 99.2 80.0 - 100.0 fL   MCH 34.1 (H) 26.0 - 34.0 pg   MCHC 34.4 30.0 - 36.0 g/dL   RDW 62.1 30.8 - 65.7 %   Platelets 64 (L) 150 - 400 K/uL    Comment: Immature Platelet Fraction may be clinically indicated, consider ordering this additional test [id number] CONSISTENT WITH PREVIOUS RESULT REPEATED TO VERIFY    nRBC 0.0 0.0 - 0.2 %   Neutrophils Relative % 66 %   Neutro Abs 2.9 1.7 - 7.7 K/uL   Lymphocytes Relative 12 %   Lymphs Abs 0.5 (L) 0.7 - 4.0 K/uL   Monocytes Relative 19 %   Monocytes Absolute 0.8 0.1 - 1.0 K/uL   Eosinophils Relative 1 %   Eosinophils Absolute 0.0 0.0 - 0.5 K/uL   Basophils Relative 1 %   Basophils Absolute 0.0 0.0 - 0.1 K/uL   Immature Granulocytes 1 %   Abs Immature Granulocytes 0.02 0.00 - 0.07 K/uL    Comment: Performed at [facility], [address] [place], [place] [address]  Comprehensive metabolic panel     Status: Abnormal   Collection Time: 03/21/21  3:51 AM  Result Value Ref Range   Sodium 136 135 - 145 mmol/L   Potassium 3.3 (L) 3.5 - 5.1 mmol/L   Chloride 104 98 - 111 mmol/L   CO2 23 22 - 32 mmol/L   Glucose, Bld 118 (H) 70 - 99 mg/dL    Comment: Glucose reference range applies only to samples taken after fasting for at least 8 hours.   BUN <5 (L) 6 - 20 mg/dL   Creatinine, Ser 2.44 0.61 - 1.24 mg/dL   Calcium 9.2 8.9 - 01.0 mg/dL   Total Protein 7.4 6.5 - 8.1 g/dL   Albumin 4.2 3.5 - 5.0 g/dL   AST 66 (H) 15 - 41 U/L   ALT 82 (H)  0 - 44 U/L   Alkaline Phosphatase 90 38 - 126 U/L   Total Bilirubin 0.9 0.3 - 1.2 mg/dL   GFR, Estimated >27 >25 mL/min    Comment: (NOTE) Calculated using the CKD-EPI Creatinine Equation (2021)    Anion gap 9 5 - 15    Comment: Performed at [facility], [address] [place], [place] [address]  Magnesium     Status: None   Collection Time: 03/21/21  3:51 AM  Result Value Ref Range   Magnesium 1.8 1.7 - 2.4 mg/dL    Comment: Performed at [facility], [address] [place], [place] [address]  Phosphorus     Status: None   Collection Time: 03/21/21  3:51 AM  Result Value Ref Range   Phosphorus 3.1 2.5 - 4.6 mg/dL    Comment: Performed at [facility], [address] [place], [place] [address]    Current Facility-Administered Medications  Medication Dose Route Frequency Provider Last Rate Last Admin  . 0.9 %  sodium  chloride infusion   Intravenous Continuous Chotiner, Claudean Severance, MD 100 mL/hr at 03/21/21 1026 Infusion Verify at 03/21/21 1026  . acetaminophen (TYLENOL) tablet 650 mg  650 mg Oral Q6H PRN Chotiner, Claudean Severance, MD       Or  . acetaminophen (TYLENOL) suppository 650 mg  650 mg Rectal Q6H PRN Chotiner, Claudean Severance, MD      . alum & mag hydroxide-simeth (MAALOX/MYLANTA) 200-200-20 MG/5ML suspension 30 mL  30 mL Oral Q6H PRN Rhunette Croft, Ankit, MD      . Chlorhexidine Gluconate Cloth 2 % PADS 6 each  6 each Topical Daily Marguerita Merles [place], DO   6 each at 03/21/21 [phone]  . dexmedetomidine (PRECEDEX) 400 MCG/100ML (4 mcg/mL) infusion  0.2-1.4 mcg/kg/hr Intravenous Continuous Leslye Peer, MD 27.8 mL/hr at 03/21/21 1026 1.4 mcg/kg/hr at 03/21/21 1026  . enoxaparin (LOVENOX) injection 40 mg  40 mg Subcutaneous Q24H Chotiner, Claudean Severance, MD      . folic acid (FOLVITE) tablet 1 mg  1 mg Oral Daily Chotiner, Claudean Severance, MD   1 mg at 03/21/21 0906  . MEDLINE mouth rinse  15 mL Mouth Rinse BID Marguerita Merles [place], DO   15 mL at  03/21/21 4098  . multivitamin with minerals tablet 1 tablet  1 tablet Oral Daily Chotiner, Claudean Severance, MD   1 tablet at 03/21/21 0906  . nicotine (NICODERM CQ - dosed in mg/24 hours) patch 14 mg  14 mg Transdermal Daily Chotiner, Claudean Severance, MD      . ondansetron [facility]) tablet 4 mg  4 mg Oral Q6H PRN Chotiner, Claudean Severance, MD   4 mg at 03/20/21 1858   Or  . ondansetron (ZOFRAN) injection 4 mg  4 mg Intravenous Q6H PRN Chotiner, Claudean Severance, MD   4 mg at 03/20/21 0451  . pantoprazole (PROTONIX) injection 40 mg  40 mg Intravenous QHS Leslye Peer, MD      . potassium chloride 10 mEq in 100 mL IVPB  10 mEq Intravenous Q1 Hr x 4 SheikhKateri Mc [place], DO 100 mL/hr at 03/21/21 1026 Infusion Verify at 03/21/21 1026  . senna-docusate (Senokot-S) tablet 1 tablet  1 tablet Oral QHS PRN Chotiner, Claudean Severance, MD      . sertraline (ZOLOFT) tablet 50 mg  50 mg Oral Daily Chotiner, Claudean Severance, MD   50 mg at 03/21/21 0906  . thiamine tablet 100 mg  100 mg Oral Daily Rhunette Croft, Ankit, MD   100 mg at 03/21/21 1191   Or  . thiamine (B-1) injection 100 mg  100 mg Intravenous Daily Derwood Kaplan, MD   100 mg at 03/19/21 1944    Musculoskeletal: Strength & Muscle Tone: within normal limits Gait & Station: Patient observed resting in bed Patient leans: N/A            Psychiatric Specialty Exam:  Presentation  General Appearance: Disheveled  Eye Contact:Fair  Speech:Clear and Coherent  Speech Volume:Normal  Handedness:Left   Mood and Affect  Mood:Depressed; Anxious; Irritable  Affect:Depressed   Thought Process  Thought Processes:Coherent  Descriptions of Associations:Intact  Orientation:Full (Time, Place and Person)  Thought Content:Logical  History of Schizophrenia/Schizoaffective disorder:No data recorded Duration of Psychotic Symptoms:No data recorded Hallucinations:Hallucinations: Auditory (reported intermitten)  Ideas of Reference:None  Suicidal Thoughts:Suicidal Thoughts:  No  Homicidal Thoughts:Homicidal Thoughts: No   Sensorium  Memory:Immediate Fair; Recent Fair; Remote Fair  Judgment:Fair  Insight:Fair   Executive Functions  Concentration:Fair  Attention Span:Fair  Recall:Poor  Fund of Knowledge:Fair  Language:Good   Psychomotor Activity  Psychomotor Activity:Psychomotor Activity: Restlessness   Assets  Assets:Communication Skills; Intimacy; Social Support   Sleep  Sleep:Sleep: Fair   Physical Exam: Physical Exam Vitals reviewed.  Psychiatric:        Mood and Affect: Mood normal.        Behavior: Behavior is agitated.        Thought Content: Thought content normal.        Cognition and Memory: Cognition normal.    Review of Systems  Psychiatric/Behavioral: Positive for depression and substance abuse.   Blood pressure 115/84, pulse 94, temperature 97.9 F (36.6 C), temperature source Oral, resp. rate 14, height 6' (1.829 m), weight 79.4 kg, SpO2 98 %. Body mass index is 23.73 kg/m.  Treatment Plan Summary: Daily contact with patient to assess and evaluate symptoms and progress in treatment and Medication management  -Continue alcohol detox protocol -Continue Zoloft 50 mg p.o. daily for depression/anxiety -Consider restarting Seroquel 50 mg p.o. nightly for mood stabilization -CSW to provide additional outpatient resources for [facility] urgent care facility for substance abuse programming and medication management.    Disposition: No evidence of imminent risk to self or others at present.   Patient does not meet criteria for psychiatric inpatient admission. Supportive therapy provided about ongoing stressors. Refer to IOP. Discussed crisis plan, support from social network, calling 911, coming to the Emergency Department, and calling Suicide Hotline.   Thank you for contacting Psychiatry Services  Oneta Rack, NP 03/21/2021 10:50 AM

## 2021-03-21 NOTE — TOC Progression Note (Signed)
Transition of Care [facility]) - Progression Note    Patient Details  Name: John Vaughan MRN: [id number] Date of Birth: 20-May-1991  Transition of Care [facility]) CM/SW Contact  Ida Rogue, [place] Phone Number: 03/21/2021, 11:29 AM  Clinical Narrative:   CSW alerted this AM that patient is in need of [name] as he has altered mental status due to alcohol withdrawal and is trying to leave the hospital.  Paperwork filled out, signed, notarized and received back from [employer] office where Ms Gala Lewandowsky signed off on Findings and Custody order.  GPS called to serve [name] papers, and paperwork left in patient's chart on step down unit.  TOC will continue to follow during the course of hospitalization.       Expected Discharge Plan: Home/Self Care Barriers to Discharge: No Barriers Identified  Expected Discharge Plan and Services Expected Discharge Plan: Home/Self Care In-house Referral: Clinical Social Work     Living arrangements for the past 2 months: Single Family Home                                       Social Determinants of Health (SDOH) Interventions    Readmission Risk Interventions No flowsheet data found.

## 2021-03-21 NOTE — Consult Note (Signed)
NAME:  John Vaughan, MRN:  [id number], DOB:  Mar 19, 1991, LOS: 1 ADMISSION DATE:  03/19/2021, CONSULTATION DATE: 03/21/2020 REFERRING MD: Dr. Marland Mcalpine, CHIEF COMPLAINT: Agitated delirium  History of Present Illness:  30 year old man with history of alcohol abuse, tobacco abuse, depression and anxiety with reported history of suicidal ideation in the past.  He typically would drink 12 beers daily but intentionally cut back over the last several days, down to 1 beer daily.  Last drink was evening of 3/26 (1 beer).  Admitted with tremors, weakness, mild diarrhea/emesis, T102F.  Evaluation consistent with alcohol withdrawal.  No seizures noted.  Treated with Librium taper, scheduled Ativan and also Ativan per CIWA scoring.  Course has been characterized by auditory hallucinations, tremor, progressive agitated delirium despite these therapies.  He moves to the ICU 3/29 for further care, Precedex  Pertinent  Medical History   Past Medical History:  Diagnosis Date  . Alcohol abuse   . Anxiety   . Depression      Significant Hospital Events: Including procedures, antibiotic start and stop dates in addition to other pertinent events   . Admitted for alcohol withdrawal 3/27 . Blood culture 3/28 >>   . Urine culture 3/27 >> negative   Interim History / Subjective:    Objective   Blood pressure 115/84, pulse 94, temperature 97.9 F (36.6 C), temperature source Oral, resp. rate 14, height 6' (1.829 m), weight 79.4 kg, SpO2 98 %.        Intake/Output Summary (Last 24 hours) at 03/21/2021 0917 Last data filed at 03/21/2021 0640 Gross per 24 hour  Intake 4091.84 ml  Output --  Net 4091.84 ml   Filed Weights   03/19/21 1955  Weight: 79.4 kg    Examination: General: Ill-appearing young man.  Well-developed.  Agitated HENT: Oropharynx clear, pupils 4-5 mm.  Unable to check reactivity Lungs: Clear bilaterally, somewhat decreased at both bases Cardiovascular: Tachycardic 120s,  regular, no murmur Abdomen: Nondistended, positive bowel sounds Extremities: No edema.  He has some bruising on his shins. Neuro: Awake, very active picking at wires, sheets, bedding.  Answers questions but oriented to name.  Confabulating and having hallucinations.  Moves all extremities with good strength.  Labs/imaging that I havepersonally reviewed  (right click and "Reselect all SmartList Selections" daily)   Chest x-ray 03/19/2021 reviewed by me shows clear lungs, no infiltrates UA 3/28 normal Mild hypomagnesemia and hypokalemia Mild metabolic acidosis, lactate 1.2  >> 0.7 Transaminitis, slightly improved from 3/28 Normal CBC Blood cultures 3/27 no growth so far Urine culture 3/28 negative  Resolved Hospital Problem list   Mild metabolic acidosis Fever  Assessment & Plan:  Agitated delirium due to alcohol withdrawal -Transition to ICU care for Precedex infusion and weaning.  -Safety sitter is at bedside -Hold scheduled Ativan for now until we determine his sedation needs.  Ativan available as needed for severe agitation.  Transition back to as needed Ativan via CIWA scoring once stabilized -Librium taper is completed, last dose this morning -Folate, thiamine, MVI -Will need support, case management consultation regarding possible cessation counseling, rehab programs, etc.  Transaminitis -Check hepatitis panel -Consider right upper quadrant ultrasound when acute issues stabilize  Thrombocytopenia due to alcoholism -Follow CBC -Follow for any evidence of overt bleeding  Electrolyte Imbalance, at risk refeeding syndrome -replete Mg and K 3/29  Depression and anxiety -Continue his home sertraline 50 mg nightly -Currently denies any suicidal ideation and contracts for safety.  We will need to continue to follow  this -Suspect he will need a psychiatry evaluation before discharge, once his acute withdrawal is improved  Tobacco use -Consider nicotine patch if significant  cravings, impacting his other care   Best practice (right click and "Reselect all SmartList Selections" daily)  Diet:  NPO Pain/Anxiety/Delirium protocol (if indicated): No VAP protocol (if indicated): Not indicated DVT prophylaxis: LMWH and SCD GI prophylaxis: PPI Glucose control:  SSI No Central venous access:  N/A Arterial line:  N/A Foley:  N/A Mobility:  bed rest  PT consulted: N/A Last date of multidisciplinary goals of care discussion [3/29] Code Status:  full code Disposition: ICU  Labs   CBC: Recent Labs  Lab 03/19/21 1937 03/20/21 0437 03/21/21 0351  WBC 4.6 3.9* 4.3  NEUTROABS 3.5  --  2.9  HGB 14.5 13.2 13.3  HCT 42.5 38.8* 38.7*  MCV 100.5* 98.5 99.2  PLT 78* 67* 64*    Basic Metabolic Panel: Recent Labs  Lab 03/19/21 1937 03/20/21 0437 03/21/21 0351  NA 139 136 136  K 4.2 3.6 3.3*  CL 101 99 104  CO2 21* 24 23  GLUCOSE 90 79 118*  BUN 7 7 <5*  CREATININE 0.73 0.75 0.75  CALCIUM 9.6 9.5 9.2  MG 1.9 1.8 1.8  PHOS  --   --  3.1   GFR: Estimated Creatinine Clearance: 152.2 mL/min (by C-G formula based on SCr of 0.75 mg/dL). Recent Labs  Lab 03/19/21 1937 03/19/21 2108 03/20/21 0437 03/20/21 1022 03/20/21 1231 03/21/21 0351  WBC 4.6  --  3.9*  --   --  4.3  LATICACIDVEN 4.1* 2.3*  --  1.1 0.7  --     Liver Function Tests: Recent Labs  Lab 03/19/21 1937 03/20/21 0437 03/21/21 0351  AST 121* 89* 66*  ALT 121* 101* 82*  ALKPHOS 105 94 90  BILITOT 1.0 1.2 0.9  PROT 8.3* 7.5 7.4  ALBUMIN 4.5 4.3 4.2   No results for input(s): LIPASE, AMYLASE in the last 168 hours. No results for input(s): AMMONIA in the last 168 hours.  ABG No results found for: PHART, PCO2ART, PO2ART, HCO3, TCO2, ACIDBASEDEF, O2SAT   Coagulation Profile: Recent Labs  Lab 03/19/21 1937  INR 0.9     Review of Systems:   Knows that he is confused, frustrated by this.  Denies any other complaints  Past Medical History:  He,  has a past medical history of  Alcohol abuse, Anxiety, and Depression.   Surgical History:  History reviewed. No pertinent surgical history.   Social History:   reports that he has been smoking cigarettes. He has been smoking about 1.00 pack per day. He has quit using smokeless tobacco.  His smokeless tobacco use included snuff. He reports current alcohol use of about 18.0 standard drinks of alcohol per week. He reports previous drug use. Drug: Marijuana.   Family History:  His family history includes Alcohol abuse in his father and paternal grandfather; Anxiety disorder in his maternal aunt, maternal grandmother, and mother.   Allergies No Known Allergies   Home Medications  Prior to Admission medications   Medication Sig Start Date End Date Taking? Authorizing Provider  clonazePAM (KLONOPIN) 0.5 MG tablet Take 1 tablet (0.5 mg total) by mouth 2 (two) times daily as needed for anxiety. Patient taking differently: Take 1 mg by mouth daily as needed for anxiety. 04/22/20  Yes Henderly, Britni A, PA-C  gabapentin (NEURONTIN) 300 MG capsule Take 900 mg by mouth 3 (three) times daily.  04/14/20  Yes [provider]  Multiple Vitamins-Minerals (MULTIVITAMIN ADULTS PO) Take 1 tablet by mouth daily.   Yes [provider]  ondansetron (ZOFRAN) 4 MG tablet Take 1 tablet (4 mg total) by mouth every 6 (six) hours. 05/27/20  Yes Carroll Sage, PA-C  pantoprazole (PROTONIX) 40 MG tablet Take 40 mg by mouth daily as needed (heartburn). 12/08/20  Yes [provider]  sertraline (ZOLOFT) 50 MG tablet Take 50 mg by mouth daily. 06/09/20  Yes [provider]  chlordiazePOXIDE (LIBRIUM) 25 MG capsule 50mg  PO TID x 1D, then 25 mg PO BID X 1D, then 25 mg PO QD X 2D 03/19/21   03/21/21, MD     Critical care time: 45 min     Derwood Kaplan, MD, PhD 03/21/2021, 9:54 AM [facility] [phone] or if no answer before 7:00PM call [phone] For any issues after 7:00PM  please call eLink [phone]

## 2021-03-21 NOTE — Progress Notes (Signed)
Pt states he wishes to leave the hospital AMA. He states he just wants to go home and he is unable to sleep. MD notified. Ambien ordered. Pt ok with staying and trying ambien for sleep. Ambien and scheduled Ativan given but pt still did not sleep tonight. Will continue CIWA protocol.

## 2021-03-22 ENCOUNTER — Inpatient Hospital Stay (HOSPITAL_COMMUNITY): Payer: Self-pay

## 2021-03-22 DIAGNOSIS — F10232 Alcohol dependence with withdrawal with perceptual disturbance: Secondary | ICD-10-CM

## 2021-03-22 MED ORDER — LORAZEPAM 2 MG/ML IJ SOLN
2.0000 mg | Freq: Once | INTRAMUSCULAR | Status: AC
  Administered 2021-03-22: 2 mg via INTRAVENOUS
  Filled 2021-03-22: qty 1

## 2021-03-22 MED ORDER — POTASSIUM CHLORIDE CRYS ER 20 MEQ PO TBCR
40.0000 meq | EXTENDED_RELEASE_TABLET | Freq: Once | ORAL | Status: AC
  Administered 2021-03-22: 40 meq via ORAL
  Filled 2021-03-22: qty 2

## 2021-03-22 MED ORDER — NICOTINE 7 MG/24HR TD PT24
7.0000 mg | MEDICATED_PATCH | Freq: Every day | TRANSDERMAL | Status: DC
  Filled 2021-03-22: qty 1

## 2021-03-22 MED ORDER — GUAIFENESIN 100 MG/5ML PO SOLN
10.0000 mL | ORAL | Status: DC | PRN
  Administered 2021-03-22 – 2021-03-23 (×2): 200 mg via ORAL
  Filled 2021-03-22: qty 100
  Filled 2021-03-22: qty 20

## 2021-03-22 MED ORDER — LORAZEPAM 1 MG PO TABS
2.0000 mg | ORAL_TABLET | Freq: Four times a day (QID) | ORAL | Status: DC
  Administered 2021-03-22 – 2021-03-23 (×5): 2 mg via ORAL
  Filled 2021-03-22 (×5): qty 2

## 2021-03-22 MED ORDER — MAGNESIUM OXIDE 400 (241.3 MG) MG PO TABS
400.0000 mg | ORAL_TABLET | Freq: Once | ORAL | Status: AC
  Administered 2021-03-22: 400 mg via ORAL
  Filled 2021-03-22: qty 1

## 2021-03-22 NOTE — TOC Progression Note (Signed)
Transition of Care [facility]) - Progression Note    Patient Details  Name: John Vaughan MRN: [id number] Date of Birth: April 19, 1991  Transition of Care [facility]) CM/SW Contact  Golda Acre, RN Phone Number: 03/22/2021, 7:40 AM  Clinical Narrative:    30 year old man with history of alcohol abuse, tobacco abuse, depression and anxiety with reported history of suicidal ideation in the past.  He typically would drink 12 beers daily but intentionally cut back over the last several days, down to 1 beer daily.  Last drink was evening of 3/26 (1 beer).  Admitted with tremors, weakness, mild diarrhea/emesis, T102F.  Evaluation consistent with alcohol withdrawal.  No seizures noted.  Treated with Librium taper, scheduled Ativan and also Ativan per CIWA scoring.  Course has been characterized by auditory hallucinations, tremor, progressive agitated delirium despite these therapies.  He moves to the ICU 3/29 for further care, Precedex Iv precedex drip for agitation.  Expected Discharge Plan: Home/Self Care Barriers to Discharge: No Barriers Identified  Expected Discharge Plan and Services Expected Discharge Plan: Home/Self Care In-house Referral: Clinical Social Work     Living arrangements for the past 2 months: Single Family Home                                       Social Determinants of Health (SDOH) Interventions    Readmission Risk Interventions No flowsheet data found.

## 2021-03-22 NOTE — Progress Notes (Addendum)
NAME:  John Vaughan, MRN:  [id number], DOB:  01/31/1991, LOS: 2 ADMISSION DATE:  03/19/2021, CONSULTATION DATE: 03/21/2020 REFERRING MD: Dr. Marland Mcalpine, CHIEF COMPLAINT: Agitated delirium  History of Present Illness:  30 year old man with history of alcohol abuse, tobacco abuse, depression and anxiety with reported history of suicidal ideation in the past.  He typically would drink 12 beers daily but intentionally cut back over the last several days, down to 1 beer daily.  Last drink was evening of 3/26 (1 beer).  Admitted with tremors, weakness, mild diarrhea/emesis, T102F.  Evaluation consistent with alcohol withdrawal.  No seizures noted.  Treated with Librium taper, scheduled Ativan and also Ativan per CIWA scoring.  Course has been characterized by auditory hallucinations, tremor, progressive agitated delirium despite these therapies.  He moves to the ICU 3/29 for further care, Precedex  Pertinent  Medical History   Past Medical History:  Diagnosis Date  . Alcohol abuse   . Anxiety   . Depression      Significant Hospital Events: Including procedures, antibiotic start and stop dates in addition to other pertinent events   . Admitted for alcohol withdrawal 3/27 . Blood culture 3/28 >>   . Urine culture 3/27 >> negative . Hepatitis panel negative   Interim History / Subjective:   Improved today.  Significantly less agitation.  Less confused although he is still experiencing some hallucinations, thinks he is in another location and then has to be reoriented. Precedex currently at 0.4, also improved  Objective   Blood pressure 107/71, pulse 64, temperature 98.1 F (36.7 C), temperature source Oral, resp. rate 14, height 6' (1.829 m), weight 79.4 kg, SpO2 97 %.        Intake/Output Summary (Last 24 hours) at 03/22/2021 0950 Last data filed at 03/22/2021 0509 Gross per 24 hour  Intake 2778.39 ml  Output 1600 ml  Net 1178.39 ml   Filed Weights   03/19/21 1955  Weight:  79.4 kg    Examination: General: Young man, well-developed, slightly anxious but much less agitated HENT: Oropharynx clear, pupils equal, no stridor Lungs: Clear bilaterally without any wheezes or crackles Cardiovascular: Distant, regular, no murmur Abdomen: Nondistended, positive bowel sounds Extremities: No edema, some bruising lower extremities Neuro: Awake, more calm, interacts and answers questions.  Oriented to self, time, place.  Describes some persistent occasional confusion and hallucinations.  Moving all extremities with good strength.  No focal deficits  Labs/imaging that I havepersonally reviewed  (right click and "Reselect all SmartList Selections" daily)   Blood cultures 3/27 still negative Urine culture 3/28 no growth Remains thrombocytopenic, 64 Slight improvement ALT/AST compared with 3/28 Hepatitis panel negative   Resolved Hospital Problem list   Mild metabolic acidosis Fever  Assessment & Plan:  Agitated delirium due to alcohol withdrawal -Start scheduled p.o. Ativan and work to wean Precedex off.  Once this is accomplished then will restart his symptom triggered Ativan via CIWA -Folate, thiamine, MVI -Will need case management support regarding cessation counseling and possible inpatient rehab if he is agreeable to this -Okay to start p.o. diet, get out of bed, normalizes care, etc.  Transaminitis, hepatitis panel negative -Suspect due to alcohol use, possible mild acute alcoholic hepatitis -Check right upper quadrant ultrasound now he is stabilized  Thrombocytopenia due to alcoholism -Continue to follow CBC -Continue enoxaparin for now, tolerating.  Consider stopping if platelets drop further -Follow for any evidence of active bleeding, none seen  Electrolyte Imbalance, at risk refeeding syndrome -Replete potassium and magnesium  p.o. 3/30  Depression and anxiety -Continue home sertraline as ordered -Denies any suicidal ideation and does contract  for safety.  Appreciate psychiatry assistance. -Will need psychiatry input with regard to disposition, possible elective inpatient treatment of his depression and substance use  Tobacco use -Nicotine patch as ordered, will change to 7 mg   Best practice (right click and "Reselect all SmartList Selections" daily)  Diet:  Oral Pain/Anxiety/Delirium protocol (if indicated): No VAP protocol (if indicated): Not indicated DVT prophylaxis: LMWH and SCD GI prophylaxis: n/a Glucose control:  SSI No Central venous access:  N/A Arterial line:  N/A Foley:  N/A Mobility:  OOB  PT consulted: N/A Last date of multidisciplinary goals of care discussion [3/29] Code Status:  full code Disposition: ICU Family: Spoke with his mother by phone and updated her 3/30  Labs   CBC: Recent Labs  Lab 03/19/21 1937 03/20/21 0437 03/21/21 0351  WBC 4.6 3.9* 4.3  NEUTROABS 3.5  --  2.9  HGB 14.5 13.2 13.3  HCT 42.5 38.8* 38.7*  MCV 100.5* 98.5 99.2  PLT 78* 67* 64*    Basic Metabolic Panel: Recent Labs  Lab 03/19/21 1937 03/20/21 0437 03/21/21 0351  NA 139 136 136  K 4.2 3.6 3.3*  CL 101 99 104  CO2 21* 24 23  GLUCOSE 90 79 118*  BUN 7 7 <5*  CREATININE 0.73 0.75 0.75  CALCIUM 9.6 9.5 9.2  MG 1.9 1.8 1.8  PHOS  --   --  3.1   GFR: Estimated Creatinine Clearance: 152.2 mL/min (by C-G formula based on SCr of 0.75 mg/dL). Recent Labs  Lab 03/19/21 1937 03/19/21 2108 03/20/21 0437 03/20/21 1022 03/20/21 1231 03/21/21 0351  WBC 4.6  --  3.9*  --   --  4.3  LATICACIDVEN 4.1* 2.3*  --  1.1 0.7  --     Liver Function Tests: Recent Labs  Lab 03/19/21 1937 03/20/21 0437 03/21/21 0351  AST 121* 89* 66*  ALT 121* 101* 82*  ALKPHOS 105 94 90  BILITOT 1.0 1.2 0.9  PROT 8.3* 7.5 7.4  ALBUMIN 4.5 4.3 4.2   No results for input(s): LIPASE, AMYLASE in the last 168 hours. No results for input(s): AMMONIA in the last 168 hours.  ABG No results found for: PHART, PCO2ART, PO2ART,  HCO3, TCO2, ACIDBASEDEF, O2SAT   Coagulation Profile: Recent Labs  Lab 03/19/21 1937  INR 0.9     Critical care time: 33 min     Levy Pupa, MD, PhD 03/22/2021, 9:50 AM [facility] [phone] or if no answer before 7:00PM call [phone] For any issues after 7:00PM please call eLink [phone]

## 2021-03-22 NOTE — Evaluation (Signed)
Physical Therapy Evaluation Patient Details Name: John Vaughan MRN: [id number] DOB: 06/07/1991 Today's Date: 03/22/2021   History of Present Illness  30 year old man with history of alcohol abuse, tobacco abuse, depression and anxiety with reported history of suicidal ideation in the past.    Admitted with tremors, weakness, mild diarrhea/emesis, T102F, consistent with alcohol withdrawal, characterized by auditory hallucinations, tremor, progressive agitated delirium requiring precedex.  Clinical Impression  Patient presents with mild tremors and mildly ataxic when ambulating with mild balance loses.Patient held IV pole to ambulate 250'.. Patient should progress to return home. Patient reports that he lives with his mother.HR 115 while ambulating. Patient oriented to [facility], not date. Stated that he has had hallucinations  Since he fell down a flight of  Steps and recently ahs been using a cane to ambulate. ? If reliable information from patient.  Pt admitted with above diagnosis. Blanchard Kelch PT Acute Rehabilitation Services Pager [phone] Office [phone]  Pt currently with functional limitations due to the deficits listed below (see PT Problem List). Pt will benefit from skilled PT to increase their independence and safety with mobility to allow discharge to the venue listed below.       Follow Up Recommendations No PT follow up    Equipment Recommendations  None recommended by PT    Recommendations for Other Services OT consult     Precautions / Restrictions Precautions Precautions: Fall      Mobility  Bed Mobility Overal bed mobility: Needs Assistance Bed Mobility: Supine to Sit;Sit to Supine     Supine to sit: Supervision Sit to supine: Supervision   General bed mobility comments: safety    Transfers Overall transfer level: Needs assistance Equipment used: None Transfers: Sit to/from Stand Sit to Stand: Min guard         General transfer comment:  cues for safety, impulsive  Ambulation/Gait Ambulation/Gait assistance: Min assist;Min guard Gait Distance (Feet): 250 Feet Assistive device: IV Pole Gait Pattern/deviations: Drifts right/left Gait velocity: decr   General Gait Details: initially no support, then took IV pole as felt off balance  Stairs            Wheelchair Mobility    Modified Rankin (Stroke Patients Only)       Balance Overall balance assessment: Needs assistance Sitting-balance support: No upper extremity supported;Feet supported Sitting balance-Leahy Scale: Good     Standing balance support: During functional activity;No upper extremity supported Standing balance-Leahy Scale: Poor Standing balance comment: static without UE support is fair, dynamic is poor                             Pertinent Vitals/Pain Pain Assessment: No/denies pain    Home Living Family/patient expects to be discharged to:: Private residence Living Arrangements: Parent Available Help at Discharge: Family Type of Home: House Home Access: Stairs to enter   Secretary/administrator of Steps: 3 Home Layout: One level Home Equipment: Cane - single point Additional Comments: lives with mother, ?if she is home 24/7    Prior Function Level of Independence: Independent with assistive device(s)         Comments: stated he used his father's cane recently after he fell down stairs and hit his head     Hand Dominance        Extremity/Trunk Assessment   Upper Extremity Assessment Upper Extremity Assessment: Generalized weakness (trmors)    Lower Extremity Assessment Lower Extremity Assessment: Generalized weakness (tremors when  standing)    Cervical / Trunk Assessment Cervical / Trunk Assessment: Normal  Communication   Communication: No difficulties  Cognition Arousal/Alertness: Awake/alert Behavior During Therapy: WFL for tasks assessed/performed;Impulsive Overall Cognitive Status:  Impaired/Different from baseline Area of Impairment: Orientation;Attention;Memory;Following commands                 Orientation Level: Time Current Attention Level: Selective Memory: Decreased recall of precautions Following Commands: Follows multi-step commands consistently              General Comments      Exercises     Assessment/Plan    PT Assessment Patient needs continued PT services  PT Problem List Decreased strength;Decreased mobility;Decreased safety awareness;Decreased knowledge of precautions;Decreased cognition;Decreased activity tolerance;Decreased knowledge of use of DME       PT Treatment Interventions Gait training;Functional mobility training;Therapeutic exercise;Balance training;Therapeutic activities;Cognitive remediation;Patient/family education    PT Goals (Current goals can be found in the Care Plan section)  Acute Rehab PT Goals 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 PT Goal Formulation: With patient Time For Goal Achievement: 04/05/21 Potential to Achieve Goals: Good    Frequency Min 3X/week   Barriers to discharge        Co-evaluation               AM-PAC PT "6 Clicks" Mobility  Outcome Measure Help needed turning from your back to your side while in a flat bed without using bedrails?: None Help needed moving from lying on your back to sitting on the side of a flat bed without using bedrails?: None Help needed moving to and from a bed to a chair (including a wheelchair)?: A Little Help needed standing up from a chair using your arms (e.g., wheelchair or bedside chair)?: A Little Help needed to walk in hospital room?: A Little Help needed climbing 3-5 steps with a railing? : A Lot 6 Click Score: 19    End of Session Equipment Utilized During Treatment: Gait belt Activity Tolerance: Patient tolerated treatment well Patient left: in bed;with call bell/phone within reach;with nursing/sitter in room Nurse  Communication: Mobility status PT Visit Diagnosis: Unsteadiness on feet (R26.81);Difficulty in walking, not elsewhere classified (R26.2);Ataxic gait (R26.0)    Time: 1610-9604 PT Time Calculation (min) (ACUTE ONLY): 12 min   Charges:   PT Evaluation $PT Eval Low Complexity: 1 Low           Blanchard Kelch PT Acute Rehabilitation Services Pager [phone] Office [phone]   Rada Hay 03/22/2021, 1:30 PM

## 2021-03-22 NOTE — Progress Notes (Signed)
Chaplain engaged in an initial visit with "John Vaughan."  Chaplain learned that Nida Boatman is from [place] and that he has struggled with alcohol abuse.  He noted that today he is going for an ultrasound and that he is afraid of learning about what his alcohol use has done to his liver.  John Vaughan then discussed his gradual decrease in his alcohol use which has gone from 32 beers to 12 beers and now just 1 beer.  John Vaughan detailed that he has cut back on his use all on his own.  He also credits going to church and being able to speak to his pastor regularly as helping him.  Nida Boatman did note that recently he ended up chugging a beer and the consequences he felt in his body from that.  Chaplain offered presence, listening and support.  Chaplain asked Nida Boatman if he had ever used a community/support group of counselor for his alcohol abuse and he stated, "No."      03/22/21 1200  Clinical Encounter Type  Visited With Patient  Visit Type Initial

## 2021-03-23 LAB — BASIC METABOLIC PANEL
Anion gap: 12 (ref 5–15)
BUN: 6 mg/dL (ref 6–20)
CO2: 23 mmol/L (ref 22–32)
Calcium: 9.5 mg/dL (ref 8.9–10.3)
Chloride: 101 mmol/L (ref 98–111)
Creatinine, Ser: 0.7 mg/dL (ref 0.61–1.24)
GFR, Estimated: >60 mL/min (ref >60)
Glucose, Bld: 92 mg/dL (ref 70–99)
Potassium: 3.7 mmol/L (ref 3.5–5.1)
Sodium: 136 mmol/L (ref 135–145)

## 2021-03-23 LAB — PHOSPHORUS: Phosphorus: 3.9 mg/dL (ref 2.5–4.6)

## 2021-03-23 LAB — MAGNESIUM: Magnesium: 2.1 mg/dL (ref 1.7–2.4)

## 2021-03-23 MED ORDER — CHLORDIAZEPOXIDE HCL 25 MG PO CAPS: ORAL_CAPSULE | ORAL | 0 refills | Status: AC

## 2021-03-23 MED ORDER — GABAPENTIN 300 MG PO CAPS
600.0000 mg | ORAL_CAPSULE | Freq: Every day | ORAL | Status: DC
  Administered 2021-03-23: 600 mg via ORAL
  Filled 2021-03-23: qty 2

## 2021-03-23 MED ORDER — LORAZEPAM 2 MG/ML IJ SOLN
1.0000 mg | INTRAMUSCULAR | Status: DC | PRN
Start: 2021-03-23 — End: 2021-03-23

## 2021-03-23 MED ORDER — LORAZEPAM 1 MG PO TABS: 1.0000 mg | ORAL_TABLET | ORAL | Status: DC | PRN

## 2021-03-23 NOTE — Evaluation (Signed)
Occupational Therapy Evaluation Patient Details Name: John Vaughan MRN: [id number] DOB: Oct 31, 1991 Today's Date: 03/23/2021    History of Present Illness 30 year old man with history of alcohol abuse, tobacco abuse, depression and anxiety with reported history of suicidal ideation in the past.    Admitted with tremors, weakness, mild diarrhea/emesis, T102F, consistent with alcohol withdrawal, characterized by auditory hallucinations, tremor, progressive agitated delirium requiring precedex.   Clinical Impression   Mr. Odus Clasby is a 30 year old man with above pertinent medical history. On evaluation patient demonstrates normal ROM and strength of upper extremities, mild tremors - greater in left hand than right- ability to perform transfers, ambulate in hall and perform ADLs. No overt loss of balance during ambulation. Therapist recommended caution with taking a shower for safety and patient reports falling in the shower in the past. Currently no OT needs.     Follow Up Recommendations  No OT follow up    Equipment Recommendations  None recommended by OT    Recommendations for Other Services       Precautions / Restrictions Precautions Precautions: Fall Restrictions Weight Bearing Restrictions: No      Mobility Bed Mobility               General bed mobility comments: sitting on EOB    Transfers Overall transfer level: Independent               General transfer comment: No physical assistance to ambulate, no overt loss of balance, therapist monitored vital signs.    Balance Overall balance assessment: History of Falls             Standing balance comment: no overt loss of balance, reports history of falling.                           ADL either performed or assessed with clinical judgement   ADL Overall ADL's : Modified independent                                       General ADL Comments: increased time,  tremors effect fine motor coordination but otherwise independent.     Vision Baseline Vision/History: No visual deficits       Perception     Praxis      Pertinent Vitals/Pain Pain Assessment: No/denies pain     Hand Dominance     Extremity/Trunk Assessment Upper Extremity Assessment Upper Extremity Assessment: RUE deficits/detail;LUE deficits/detail RUE Deficits / Details: WNL ROM, 5/5 strength RUE Sensation: WNL RUE Coordination: WNL LUE Deficits / Details: WNL ROm, 5/5 strength, tremor noted in left hand LUE Sensation: WNL LUE Coordination: WNL   Lower Extremity Assessment Lower Extremity Assessment: Defer to PT evaluation   Cervical / Trunk Assessment Cervical / Trunk Assessment: Normal   Communication Communication Communication: No difficulties   Cognition Arousal/Alertness: Awake/alert Behavior During Therapy: WFL for tasks assessed/performed Overall Cognitive Status: Within Functional Limits for tasks assessed                                     General Comments       Exercises     Shoulder Instructions      Home Living Family/patient expects to be discharged to:: Private residence Living Arrangements: Parent Available Help at  Discharge: Family Type of Home: House Home Access: Stairs to enter Secretary/administrator of Steps: 3   Home Layout: One level               Home Equipment: Cane - single point   Additional Comments: lives with mother, ?if she is home 24/7      Prior Functioning/Environment Level of Independence: Independent with assistive device(s)        Comments: stated he used his father's cane recently after he fell down stairs and hit his head        OT Problem List:        OT Treatment/Interventions:      OT Goals(Current goals can be found in the care plan section) Acute Rehab OT Goals 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 OT Goal Formulation: All assessment and education complete, DC  therapy  OT Frequency:     Barriers to D/C:            Co-evaluation              AM-PAC OT "6 Clicks" Daily Activity     Outcome Measure Help from another person eating meals?: None Help from another person taking care of personal grooming?: None Help from another person toileting, which includes using toliet, bedpan, or urinal?: None Help from another person bathing (including washing, rinsing, drying)?: None Help from another person to put on and taking off regular upper body clothing?: None Help from another person to put on and taking off regular lower body clothing?: None 6 Click Score: 24   End of Session    Activity Tolerance: Patient tolerated treatment well Patient left: in bed;with call bell/phone within reach;with nursing/sitter in room  OT Visit Diagnosis: Unsteadiness on feet (R26.81)                Time: 1101-1109 OT Time Calculation (min): 8 min Charges:  OT General Charges $OT Visit: 1 Visit OT Evaluation $OT Eval Low Complexity: 1 Low  Eulice Rutledge, OTR/L Acute Care Rehab Services  Office [phone] Pager: [phone]   Kelli Churn 03/23/2021, 12:39 PM

## 2021-03-23 NOTE — Discharge Summary (Signed)
Physician Discharge Summary  Anhad Sheeley EHO:[id number] DOB: 1991/09/05 DOA: 03/19/2021  PCP: Patient, No Pcp Per (Inactive)  Admit date: 03/19/2021 Discharge date: 03/23/2021  Admitted From: Home Disposition: Home   Recommendations for Outpatient Follow-up:  1. Establish care with PCP at [facility] ASAP 2. Recheck CBC, CMP with attention to platelets and LFTs at follow up. 3. Continue alcohol and tobacco cessation attempts/encouragement.  Home Health: None Equipment/Devices: None Discharge Condition: Stable CODE STATUS: Full Diet recommendation: Regular  Brief/Interim Summary: 30 year old man with history of alcohol abuse, tobacco abuse, depression and anxiety with reported history of suicidal ideation in the past.  He typically would drink 12 beers daily but intentionally cut back over the last several days, down to 1 beer daily.  Last drink was evening of 3/26 (1 beer).  Admitted with tremors, weakness, mild diarrhea/emesis, T102F.  Evaluation consistent with alcohol withdrawal.  No seizures noted.  Treated with Librium taper, scheduled Ativan and also Ativan per CIWA scoring.  Course has been characterized by auditory hallucinations, tremor, progressive agitated delirium despite these therapies.  He moved to the ICU 3/29 for further care, Precedex and was subsequently weaned off with rapid improvement in withdrawal symptoms with librium taper.   Discharge Diagnoses:  Active Problems:   Alcohol withdrawal (HCC)   Depression  Agitated delirium due to alcohol withdrawal - Continue librium taper. Continue multivitamins and alcohol cessation counseling. Pt is ready to change and has a plan including AA meetings, etc. TOC consulted to provide assistance as well.   Acute alcoholic hepatitis: Mild, hepatitis panel negative. U/S with steatosis and cholelithiasis without inflammation.   Thrombocytopenia due to alcoholism - Monitor at follow  up.  Electrolyte Imbalance: Improved hypokalemia resolved.  Depression and anxiety -Continue home medications per psychiatry. No SI/HI.   Tobacco use -Nicotine patch as ordered, cessation counseling provided.  Discharge Instructions Discharge Instructions    Discharge instructions   Complete by: As directed    You were admitted for alcohol withdrawal which has improved. You are stable for discharge with plans to go to AA meetings regularly, abstain from alcohol, and complete a taper of librium for the next 3 days as an outpatient. If your symptoms worsen, seek medical attention.   You will need follow up with a primary care doctor. Please contact the [facility] for an appointment if one is not arranged for you prior to discharge. They will need to follow your liver disease and platelet count which is low predisposing you to bleeding. Seek medical attention right away if you notice uncontrolled bleeding or bruising.     Allergies as of 03/23/2021   No Known Allergies     Medication List    TAKE these medications   chlordiazePOXIDE 25 MG capsule Commonly known as: LIBRIUM Take 1 capsule (25 mg total) by mouth 3 (three) times daily for 1 day, THEN 1 capsule (25 mg total) 2 (two) times daily for 1 day, THEN 1 capsule (25 mg total) daily for 1 day. Start taking on: March 23, 2021 What changed: See the new instructions.   clonazePAM 0.5 MG tablet Commonly known as: KLONOPIN Take 1 tablet (0.5 mg total) by mouth 2 (two) times daily as needed for anxiety. What changed:   how much to take  when to take this   gabapentin 300 MG capsule Commonly known as: NEURONTIN Take 900 mg by mouth 3 (three) times daily.   MULTIVITAMIN ADULTS PO Take 1 tablet  by mouth daily.   ondansetron 4 MG tablet Commonly known as: ZOFRAN Take 1 tablet (4 mg total) by mouth every 6 (six) hours.   pantoprazole 40 MG tablet Commonly known as: PROTONIX Take 40 mg by mouth  daily as needed (heartburn).   sertraline 50 MG tablet Commonly known as: ZOLOFT Take 50 mg by mouth daily.       Follow-up Information    [facility]. Schedule an appointment as soon as possible for a visit.   Contact information: [address] [place] [address] [phone]             No Known Allergies  Consultations:  PCCM  Procedures/Studies: DG Chest Port 1 View  Result Date: 03/19/2021 CLINICAL DATA:  Questionable sepsis - evaluate for abnormality Chest pain and fever. EXAM: PORTABLE CHEST 1 VIEW COMPARISON:  03/02/2021 radiograph and CT FINDINGS: The cardiomediastinal contours are normal. The lungs are clear. Pulmonary vasculature is normal. No consolidation, pleural effusion, or pneumothorax. No acute osseous abnormalities are seen. Thoracic compression fractures are not well seen by AP chest radiograph. IMPRESSION: No acute chest findings. Electronically Signed   By: Narda Rutherford M.D.   On: 03/19/2021 19:37   US Abdomen Limited RUQ (LIVER/GB)  Result Date: 03/22/2021 CLINICAL DATA:  Transaminitis EXAM: ULTRASOUND ABDOMEN LIMITED RIGHT UPPER QUADRANT COMPARISON:  None. FINDINGS: Gallbladder: Layering echogenic small stones versus sludge is seen. No sonographic Murphy sign noted by sonographer. Common bile duct: Diameter: 3.6 mm Liver: Increased echotexture seen throughout. No focal abnormality or biliary ductal dilatation. Portal vein is patent on color Doppler imaging with normal direction of blood flow towards the liver. Other: None. IMPRESSION: Cholelithiasis/layering sludge. No definite evidence of acute cholecystitis Hepatic steatosis Electronically Signed   By: Jonna Clark M.D.   On: 03/22/2021 15:04    Precedex infusion.  Subjective: Eager to go home. Feels withdrawal symptoms have resolved and feels prepared to remain abstinent. He's walking, eating, drinking.   Discharge Exam: Vitals:   03/23/21  0000 03/23/21 0800  BP:  132/87  Pulse:  98  Resp:    Temp: 98.5 F (36.9 C) 98.4 F (36.9 C)  SpO2:     General: Pt is alert, awake, not in acute distress Cardiovascular: RRR, S1/S2 +, no rubs, no gallops Respiratory: CTA bilaterally, no wheezing, no rhonchi Abdominal: Soft, NT, ND, bowel sounds + Extremities: No edema, no cyanosis  Labs: BNP (last 3 results) No results for input(s): BNP in the last 8760 hours. Basic Metabolic Panel: Recent Labs  Lab 03/19/21 1937 03/20/21 0437 03/21/21 0351 03/23/21 0147  NA 139 136 136 136  K 4.2 3.6 3.3* 3.7  CL 101 99 104 101  CO2 21* 24 23 23   GLUCOSE 90 79 118* 92  BUN 7 7 <5* 6  CREATININE 0.73 0.75 0.75 0.70  CALCIUM 9.6 9.5 9.2 9.5  MG 1.9 1.8 1.8 2.1  PHOS  --   --  3.1 3.9   Liver Function Tests: Recent Labs  Lab 03/19/21 1937 03/20/21 0437 03/21/21 0351  AST 121* 89* 66*  ALT 121* 101* 82*  ALKPHOS 105 94 90  BILITOT 1.0 1.2 0.9  PROT 8.3* 7.5 7.4  ALBUMIN 4.5 4.3 4.2   No results for input(s): LIPASE, AMYLASE in the last 168 hours. No results for input(s): AMMONIA in the last 168 hours. CBC: Recent Labs  Lab 03/19/21 1937 03/20/21 0437 03/21/21 0351  WBC 4.6 3.9* 4.3  NEUTROABS 3.5  --  2.9  HGB 14.5 13.2 13.3  HCT 42.5 38.8* 38.7*  MCV 100.5* 98.5 99.2  PLT 78* 67* 64*   Cardiac Enzymes: No results for input(s): CKTOTAL, CKMB, CKMBINDEX, TROPONINI in the last 168 hours. BNP: Invalid input(s): POCBNP CBG: No results for input(s): GLUCAP in the last 168 hours. D-Dimer No results for input(s): DDIMER in the last 72 hours. Hgb A1c No results for input(s): HGBA1C in the last 72 hours. Lipid Profile No results for input(s): CHOL, HDL, LDLCALC, TRIG, CHOLHDL, LDLDIRECT in the last 72 hours. Thyroid function studies No results for input(s): TSH, T4TOTAL, T3FREE, THYROIDAB in the last 72 hours.  Invalid input(s): FREET3 Anemia work up No results for input(s): VITAMINB12, FOLATE, FERRITIN, TIBC,  IRON, RETICCTPCT in the last 72 hours. Urinalysis    Component Value Date/Time   COLORURINE YELLOW 03/20/2021 0016   APPEARANCEUR CLEAR 03/20/2021 0016   LABSPEC 1.018 03/20/2021 0016   PHURINE 5.0 03/20/2021 0016   GLUCOSEU NEGATIVE 03/20/2021 0016   HGBUR NEGATIVE 03/20/2021 0016   BILIRUBINUR NEGATIVE 03/20/2021 0016   KETONESUR 20 (A) 03/20/2021 0016   PROTEINUR 30 (A) 03/20/2021 0016   NITRITE NEGATIVE 03/20/2021 0016   LEUKOCYTESUR NEGATIVE 03/20/2021 0016    Microbiology Recent Results (from the past 240 hour(s))  Resp Panel by RT-PCR (Flu A&B, Covid) Nasopharyngeal Swab     Status: None   Collection Time: 03/19/21  7:37 PM   Specimen: Nasopharyngeal Swab; Nasopharyngeal(NP) swabs in vial transport medium  Result Value Ref Range Status   SARS Coronavirus 2 by RT PCR NEGATIVE NEGATIVE Final    Comment: (NOTE) SARS-CoV-2 target nucleic acids are NOT DETECTED.  The SARS-CoV-2 RNA is generally detectable in upper respiratory specimens during the acute phase of infection. The lowest concentration of SARS-CoV-2 viral copies this assay can detect is 138 copies/mL. A negative result does not preclude SARS-Cov-2 infection and should not be used as the sole basis for treatment or other patient management decisions. A negative result may occur with  improper specimen collection/handling, submission of specimen other than nasopharyngeal swab, presence of viral mutation(s) within the areas targeted by this assay, and inadequate number of viral copies(<138 copies/mL). A negative result must be combined with clinical observations, patient history, and epidemiological information. The expected result is Negative.  Fact Sheet for Patients:  BloggerCourse.com  Fact Sheet for Healthcare Providers:  SeriousBroker.it  This test is no t yet approved or cleared by the [place] FDA and  has been authorized for detection and/or  diagnosis of SARS-CoV-2 by FDA under an Emergency Use Authorization (EUA). This EUA will remain  in effect (meaning this test can be used) for the duration of the COVID-19 declaration under Section 564(b)(1) of the Act, 21 U.S.C.section 360bbb-3(b)(1), unless the authorization is terminated  or revoked sooner.       Influenza A by PCR NEGATIVE NEGATIVE Final   Influenza B by PCR NEGATIVE NEGATIVE Final    Comment: (NOTE) The Xpert Xpress SARS-CoV-2/FLU/RSV plus assay is intended as an aid in the diagnosis of influenza from Nasopharyngeal swab specimens and should not be used as a sole basis for treatment. Nasal washings and aspirates are unacceptable for Xpert Xpress SARS-CoV-2/FLU/RSV testing.  Fact Sheet for Patients: BloggerCourse.com  Fact Sheet for Healthcare Providers: SeriousBroker.it  This test is not yet approved or cleared by the [place] FDA and has been authorized for detection and/or diagnosis of SARS-CoV-2 by FDA under an Emergency Use Authorization (EUA). This EUA will remain in effect (meaning  this test can be used) for the duration of the COVID-19 declaration under Section 564(b)(1) of the Act, 21 U.S.C. section 360bbb-3(b)(1), unless the authorization is terminated or revoked.  Performed at [facility], [address] [place], [place] [address]   Blood Culture (routine x 2)     Status: None (Preliminary result)   Collection Time: 03/19/21  7:37 PM   Specimen: BLOOD  Result Value Ref Range Status   Specimen Description   Final    BLOOD SITE NOT SPECIFIED Performed at [facility], [address] [place], [place] [address]    Special Requests   Final    BOTTLES DRAWN AEROBIC AND ANAEROBIC Blood Culture adequate volume Performed at [facility], [address] [place], [place] [address]    Culture   Final    NO GROWTH 3 DAYS Performed at [facility], [address] [place], [place] [address]    Report Status PENDING  Incomplete  Blood Culture (routine x 2)     Status: None (Preliminary result)   Collection Time: 03/19/21  7:56 PM   Specimen: BLOOD RIGHT HAND  Result Value Ref Range Status   Specimen Description   Final    BLOOD RIGHT HAND Performed at [facility], [address] [place], [place] [address]    Special Requests   Final    BOTTLES DRAWN AEROBIC AND ANAEROBIC Blood Culture adequate volume Performed at [facility], [address] [place], [place] [address]    Culture   Final    NO GROWTH 3 DAYS Performed at [facility], [address] [place], [place] [address]    Report Status PENDING  Incomplete  Urine culture     Status: Abnormal   Collection Time: 03/20/21 12:16 AM   Specimen: In/Out Cath Urine  Result Value Ref Range Status   Specimen Description   Final    IN/OUT CATH URINE Performed at [facility], [address] [place], [place] [address]    Special Requests   Final    NONE Performed at [facility], [address] [place], [place] [address]    Culture (A)  Final    <10,000 COLONIES/mL INSIGNIFICANT GROWTH Performed at [facility], [address] [place], [place] [address]    Report Status 03/21/2021 FINAL  Final  MRSA PCR Screening     Status: None   Collection Time: 03/21/21  8:44 AM   Specimen: Nasal Mucosa; Nasopharyngeal  Result Value Ref Range Status   MRSA by PCR NEGATIVE NEGATIVE Final    Comment:        The GeneXpert MRSA Assay (FDA approved for NASAL specimens only), is one component of a comprehensive MRSA colonization surveillance program. It is not intended to diagnose MRSA infection nor to guide or monitor treatment for MRSA infections. Performed at [facility], [address] [place], [place] [address]     Time coordinating discharge: Approximately 40  minutes  Tyrone Nineyan B Quanisha Drewry, MD  [facility] 03/23/2021, 9:36 AM

## 2021-03-23 NOTE — Progress Notes (Signed)
eLink Physician-Brief Progress Note Patient Name: John Vaughan DOB: 1991-07-02 MRN: [id number]   Date of Service  03/23/2021  HPI/Events of Note  Requesting sleep med  eICU Interventions  On ativan 2 q6 Resume home gabapentin 600 mg qhs     Intervention Category Minor Interventions: Routine modifications to care plan (e.g. PRN medications for pain, fever)  Comer Locket. Rakisha Pincock 03/23/2021, 1:16 AM

## 2021-03-23 NOTE — TOC Progression Note (Addendum)
Transition of Care [facility]) - Progression Note    Patient Details  Name: John Vaughan MRN: [id number] Date of Birth: 1991/03/15  [facility] CM/SW Contact  Golda Acre, RN Phone Number: 03/23/2021, 8:11 AM  Significant Hospital Events: Including procedures, antibiotic start and stop dates in addition to other pertinent events    Admitted for alcohol withdrawal 3/27  Blood culture 3/28 >>    Urine culture 3/27 >> negative  Hepatitis panel negative  PLAN following for progression remains on the precedex drip,  Home with self care once w/d finished.  Rescind paper faxed to magistrate Pt discharge to home.   Expected Discharge Plan: Home/Self Care Barriers to Discharge: No Barriers Identified  Expected Discharge Plan and Services Expected Discharge Plan: Home/Self Care In-house Referral: Clinical Social Work     Living arrangements for the past 2 months: Single Family Home                                       Social Determinants of Health (SDOH) Interventions    Readmission Risk Interventions No flowsheet data found.

## 2021-03-23 NOTE — Progress Notes (Signed)
Chaplain engaged in a check-in with [place].  He noted that he was doing well today and that he had been able to walk.  Chaplain affirmed his progress.  Chaplain will follow-up and continue to offer support.     03/23/21 1200  Clinical Encounter Type  Visited With Patient  Visit Type Follow-up

## 2021-03-25 LAB — CULTURE, BLOOD (ROUTINE X 2)
Culture: NO GROWTH
Culture: NO GROWTH
Special Requests: ADEQUATE
Special Requests: ADEQUATE

## 2021-04-04 ENCOUNTER — Telehealth: Payer: Self-pay

## 2021-04-04 NOTE — Telephone Encounter (Signed)
We cannot prescribe medications until we see the patient.

## 2021-04-04 NOTE — Telephone Encounter (Signed)
Patient has a hospital follow up scheduled for 04/12/2021 with Dr. Alvis Lemmings. Patient would like to know if theres any cancellations please reach out. Patient is need of sertraline (ZOLOFT) 50 MG tablet and clonazePAM (KLONOPIN) 0.5 MG tablet to hold him over until appointment.     [facility], [place] - [address] Phone:  [phone]  Fax:  [phone]

## 2021-04-06 MED ORDER — SERTRALINE HCL 50 MG PO TABS: 50.0000 mg | ORAL_TABLET | Freq: Every day | ORAL | 3 refills | Status: DC

## 2021-04-06 NOTE — Telephone Encounter (Signed)
Pt called back asking if there were any appts available today.  He also needs the librium refilled  CB# [phone]

## 2021-04-06 NOTE — Telephone Encounter (Signed)
Pt is needing refills on medications. Will send to covering provider.  Our office doesn't prescribed Narcotics so it will be up to the provider if he will refill Clonazepam.

## 2021-04-06 NOTE — Telephone Encounter (Signed)
Contacted pt to go over provider response pt is aware that medication has been sent. Pt asked bout his other 2 medications I made pt aware that the provider is unable to fill them and if he is needing the medications he can go to the Urgent Care or ED. Pt states he understands and doesn't have any questions or concerns

## 2021-04-06 NOTE — Telephone Encounter (Signed)
I sent only the sertraline  Cannot refill librium prior to est care

## 2021-04-06 NOTE — Addendum Note (Signed)
Addended by: Shan Levans E on: 04/06/2021 12:22 PM   Modules accepted: Orders

## 2021-04-12 ENCOUNTER — Other Ambulatory Visit: Payer: Self-pay

## 2021-04-12 ENCOUNTER — Ambulatory Visit: Payer: Self-pay | Attending: Family Medicine | Admitting: Family Medicine

## 2021-04-12 ENCOUNTER — Encounter: Payer: Self-pay | Admitting: Family Medicine

## 2021-04-12 VITALS — BP 102/67 | HR 87 | Resp 18 | Ht 72.0 in | Wt 183.8 lb

## 2021-04-12 DIAGNOSIS — D6959 Other secondary thrombocytopenia: Secondary | ICD-10-CM

## 2021-04-12 DIAGNOSIS — F10988 Alcohol use, unspecified with other alcohol-induced disorder: Secondary | ICD-10-CM

## 2021-04-12 DIAGNOSIS — F419 Anxiety disorder, unspecified: Secondary | ICD-10-CM

## 2021-04-12 DIAGNOSIS — F1020 Alcohol dependence, uncomplicated: Secondary | ICD-10-CM

## 2021-04-12 DIAGNOSIS — F32A Depression, unspecified: Secondary | ICD-10-CM

## 2021-04-12 MED ORDER — NALTREXONE HCL 50 MG PO TABS: 50.0000 mg | ORAL_TABLET | Freq: Every day | ORAL | 3 refills | Status: DC

## 2021-04-12 MED ORDER — HYDROXYZINE HCL 25 MG PO TABS: 25.0000 mg | ORAL_TABLET | Freq: Three times a day (TID) | ORAL | 1 refills | Status: DC | PRN

## 2021-04-12 NOTE — Patient Instructions (Signed)
Alcohol Abuse and Dependence Information, Adult Alcohol is a widely available drug. People drink alcohol in different amounts. People who drink alcohol very often and in large amounts often have problems during and after drinking. They may develop what is called an alcohol use disorder. There are two main types of alcohol use disorders:  Alcohol abuse. This is when you use alcohol too much or too often. You may use alcohol to make yourself feel happy or to reduce stress. You may have a hard time setting a limit on the amount you drink.  Alcohol dependence. This is when you use alcohol consistently for a period of time, and your body changes as a result. This can make it hard to stop drinking because you may start to feel sick or feel different when you do not use alcohol. These symptoms are known as withdrawal. How can alcohol abuse and dependence affect me? Alcohol abuse and dependence can have a negative effect on your life. Drinking too much can lead to addiction. You may feel like you need alcohol to function normally. You may drink alcohol before work in the morning, during the day, or as soon as you get home from work in the evening. These actions can result in:  Poor work performance.  Job loss.  Financial problems.  Car crashes or criminal charges from driving after drinking alcohol.  Problems in your relationships with friends and family.  Losing the trust and respect of coworkers, friends, and family. Drinking heavily over a long period of time can permanently damage your body and brain, and can cause lifelong health issues, such as:  Damage to your liver or pancreas.  Heart problems, high blood pressure, or stroke.  Certain cancers.  Decreased ability to fight infections.  Brain or nerve damage.  Depression.  Early (premature) death. If you are careless or you crave alcohol, it is easy to drink more than your body can handle (overdose). Alcohol overdose is a serious  situation that requires hospitalization. It may lead to permanent injuries or death. What can increase my risk?  Having a family history of alcohol abuse.  Having depression or other mental health conditions.  Beginning to drink at an early age.  Binge drinking often.  Experiencing trauma, stress, and an unstable home life during childhood.  Spending time with people who drink often. What actions can I take to prevent or manage alcohol abuse and dependence?  Do not drink alcohol if: ? Your health care provider tells you not to drink. ? You are pregnant, may be pregnant, or are planning to become pregnant.  If you drink alcohol: ? Limit how much you use to:  0-1 drink a day for women.  0-2 drinks a day for men. ? Be aware of how much alcohol is in your drink. In the [place] one drink equals one 12 oz bottle of beer (355 mL), one 5 oz glass of wine (148 mL), or one 1 oz glass of hard liquor (44 mL).  Stop drinking if you have been drinking too much. This can be very hard to do if you are used to abusing alcohol. If you begin to have withdrawal symptoms, talk with your health care provider or a person that you trust. These symptoms may include anxiety, shaky hands, headache, nausea, sweating, or not being able to sleep.  Choose to drink nonalcoholic beverages in social gatherings and places where there may be alcohol. Activity  Spend more time on activities that you enjoy that do   not involve alcohol, like hobbies or exercise.  Find healthy ways to cope with stress, such as exercise, meditation, or spending time with people you care about. General information  Talk to your family, coworkers, and friends about supporting you in your efforts to stop drinking. If they drink, ask them not to drink around you. Spend more time with people who do not drink alcohol.  If you think that you have an alcohol dependency problem: ? Tell friends or family about your concerns. ? Talk with your  health care provider or another health professional about where to get help. ? Work with a therapist and a chemical dependency counselor. ? Consider joining a support group for people who struggle with alcohol abuse and dependence. Where to find support  Your health care provider.  SMART Recovery: www.smartrecovery.org Therapy and support groups  Local treatment centers or chemical dependency counselors.  Local AA groups in your community: www.aa.org   Where to find more information  Centers for Disease Control and Prevention: www.cdc.gov  National Institute on Alcohol Abuse and Alcoholism: www.niaaa.nih.gov  Alcoholics Anonymous (AA): www.aa.org Contact a health care provider if:  You drank more or for longer than you intended on more than one occasion.  You tried to stop drinking or to cut back on how much you drink, but you were not able to.  You often drink to the point of vomiting or passing out.  You want to drink so badly that you cannot think about anything else.  You have problems in your life due to drinking, but you continue to drink.  You keep drinking even though you feel anxious, depressed, or have experienced memory loss.  You have stopped doing the things you used to enjoy in order to drink.  You have to drink more than you used to in order to get the effect you want.  You experience anxiety, sweating, nausea, shakiness, and trouble sleeping when you try to stop drinking. Get help right away if:  You have thoughts about hurting yourself or others.  You have serious withdrawal symptoms, including: ? Confusion. ? Racing heart. ? High blood pressure. ? Fever. If you ever feel like you may hurt yourself or others, or have thoughts about taking your own life, get help right away. You can go to your nearest emergency department or call:  Your local emergency services (911 in the [place]  A suicide crisis helpline, such as the National Suicide Prevention  Lifeline at 1-800-273-8255. This is open 24 hours a day. Summary  Alcohol abuse and dependence can have a negative effect on your life. Drinking too much or too often can lead to addiction.  If you drink alcohol, limit how much you use.  If you are having trouble keeping your drinking under control, find ways to change your behavior. Hobbies, calming activities, exercise, or support groups can help.  If you feel you need help with changing your drinking habits, talk with your health care provider, a good friend, or a therapist, or go to an AA group. This information is not intended to replace advice given to you by your health care provider. Make sure you discuss any questions you have with your health care provider. Document Revised: 03/31/2019 Document Reviewed: 02/17/2019 Elsevier Patient Education  2021 Elsevier Inc.  

## 2021-04-12 NOTE — Progress Notes (Signed)
Subjective:  Patient ID: John Vaughan, male    DOB: [birth year]  Age: 30 y.o. MRN: [id number]  CC: Hospitalization Follow-up   HPI John Vaughan is a 30 year old male patient with history of alcohol abuse who presents to establish care after hospitalization at [facility] from 03/19/2021 through 03/23/2021 for alcohol withdrawal. Treated with Librium, Ativan and hospital course involved in ICU stay and Precedex drip.  Labs revealed thrombocytopenia thought to be secondary to alcohol use.  He states 'I'm pretty much fighting myself with alcohol' with the temptation to drink alcohol. Last drink was 12 oz of wine yesterday after being sober x11 days. Previously drank 32 oz daily. Going to church has been helpful and he has a sponsor.  He does have a h/o anxiety and depression and has been without his medications.  Financial constraint has been a major issue.  He does endorse the need to have something to calm him down. Accompanied by his mother to today's visit. Past Medical History:  Diagnosis Date  . Alcohol abuse   . Anxiety   . Depression     History reviewed. No pertinent surgical history.  Family History  Problem Relation Age of Onset  . Anxiety disorder Mother   . Alcohol abuse Father   . Anxiety disorder Maternal Aunt   . Anxiety disorder Maternal Grandmother   . Alcohol abuse Paternal Grandfather     No Known Allergies  Outpatient Medications Prior to Visit  Medication Sig Dispense Refill  . Multiple Vitamins-Minerals (MULTIVITAMIN ADULTS PO) Take 1 tablet by mouth daily.    . pantoprazole (PROTONIX) 40 MG tablet Take 40 mg by mouth daily as needed (heartburn).    . chlordiazePOXIDE (LIBRIUM) 25 MG capsule Take 25 mg by mouth 3 (three) times daily as needed for anxiety.    . clonazePAM (KLONOPIN) 0.5 MG tablet Take 1 tablet (0.5 mg total) by mouth 2 (two) times daily as needed for anxiety. (Patient not taking: Reported on 04/12/2021) 15 tablet 0   . gabapentin (NEURONTIN) 300 MG capsule Take 900 mg by mouth 3 (three) times daily.  (Patient not taking: Reported on 04/12/2021)    . ondansetron (ZOFRAN) 4 MG tablet Take 1 tablet (4 mg total) by mouth every 6 (six) hours. (Patient not taking: Reported on 04/12/2021) 12 tablet 0  . sertraline (ZOLOFT) 50 MG tablet Take 1 tablet (50 mg total) by mouth daily. (Patient not taking: Reported on 04/12/2021) 30 tablet 3   No facility-administered medications prior to visit.     ROS Review of Systems  Constitutional: Negative for activity change and appetite change.  HENT: Negative for sinus pressure and sore throat.   Eyes: Negative for visual disturbance.  Respiratory: Negative for cough, chest tightness and shortness of breath.   Cardiovascular: Negative for chest pain and leg swelling.  Gastrointestinal: Negative for abdominal distention, abdominal pain, constipation and diarrhea.  Endocrine: Negative.   Genitourinary: Negative for dysuria.  Musculoskeletal: Negative for joint swelling and myalgias.  Skin: Negative for rash.  Allergic/Immunologic: Negative.   Neurological: Negative for weakness, light-headedness and numbness.  Psychiatric/Behavioral: Negative for dysphoric mood and suicidal ideas.    Objective:  BP 102/67   Pulse 87   Resp 18   Ht 6' (1.829 m)   Wt 183 lb 12.8 oz (83.4 kg)   SpO2 98%   BMI 24.93 kg/m   BP/Weight 04/12/2021 03/23/2021 03/19/2021  Systolic BP 102 133 -  Diastolic BP 67 88 -  Wt. (Lbs)  183.8 - 175  BMI 24.93 - 23.73  Some encounter information is confidential and restricted. Go to Review Flowsheets activity to see all data.      Physical Exam Constitutional:      Appearance: He is well-developed.  Neck:     Vascular: No JVD.  Cardiovascular:     Rate and Rhythm: Normal rate.     Heart sounds: Normal heart sounds. No murmur heard.   Pulmonary:     Effort: Pulmonary effort is normal.     Breath sounds: Normal breath sounds. No wheezing  or rales.  Chest:     Chest wall: No tenderness.  Abdominal:     General: Bowel sounds are normal. There is no distension.     Palpations: Abdomen is soft. There is no mass.     Tenderness: There is no abdominal tenderness.  Musculoskeletal:        General: Normal range of motion.     Right lower leg: No edema.     Left lower leg: No edema.  Neurological:     Mental Status: He is alert and oriented to person, place, and time.     Comments: Tremors  Psychiatric:        Mood and Affect: Mood normal.     CMP Latest Ref Rng & Units 03/23/2021 03/21/2021 03/20/2021  Glucose 70 - 99 mg/dL 92 124(P) 79  BUN 6 - 20 mg/dL 6 <8(K) 7  Creatinine 9.98 - 1.24 mg/dL 3.38 2.50 5.39  Sodium 135 - 145 mmol/L 136 136 136  Potassium 3.5 - 5.1 mmol/L 3.7 3.3(L) 3.6  Chloride 98 - 111 mmol/L 101 104 99  CO2 22 - 32 mmol/L 23 23 24   Calcium 8.9 - 10.3 mg/dL 9.5 9.2 9.5  Total Protein 6.5 - 8.1 g/dL - 7.4 7.5  Total Bilirubin 0.3 - 1.2 mg/dL - 0.9 1.2  Alkaline Phos 38 - 126 U/L - 90 94  AST 15 - 41 U/L - 66(H) 89(H)  ALT 0 - 44 U/L - 82(H) 101(H)    Lipid Panel  No results found for: CHOL, TRIG, HDL, CHOLHDL, VLDL, LDLCALC, LDLDIRECT  CBC    Component Value Date/Time   WBC 4.3 03/21/2021 0351   RBC 3.90 (L) 03/21/2021 0351   HGB 13.3 03/21/2021 0351   HCT 38.7 (L) 03/21/2021 0351   PLT 64 (L) 03/21/2021 0351   MCV 99.2 03/21/2021 0351   MCH 34.1 (H) 03/21/2021 0351   MCHC 34.4 03/21/2021 0351   RDW 13.1 03/21/2021 0351   LYMPHSABS 0.5 (L) 03/21/2021 0351   MONOABS 0.8 03/21/2021 0351   EOSABS 0.0 03/21/2021 0351   BASOSABS 0.0 03/21/2021 0351    No results found for: HGBA1C  Assessment & Plan:  1. Alcohol use disorder, severe, dependence (HCC) He declines going to alcohol Anonymous as he does have support by means of his sponsor and his church I have placed him on naltrexone He does have mild tremors but does not exhibit overt symptoms of withdrawal at this time - naltrexone  (DEPADE) 50 MG tablet; Take 1 tablet (50 mg total) by mouth daily.  Dispense: 30 tablet; Refill: 3  2. Anxiety and depression He has a prescription for Zoloft to the pharmacy which he needs to pick up Financial constraints with major limitation to compliance We will place on hydroxyzine as needed anxiety - hydrOXYzine (ATARAX/VISTARIL) 25 MG tablet; Take 1 tablet (25 mg total) by mouth 3 (three) times daily as needed.  Dispense: 60 tablet;  Refill: 1  3. Alcohol-induced thrombocytopenia (HCC) Due for repeat labs which he declines    Meds ordered this encounter  Medications  . naltrexone (DEPADE) 50 MG tablet    Sig: Take 1 tablet (50 mg total) by mouth daily.    Dispense:  30 tablet    Refill:  3  . hydrOXYzine (ATARAX/VISTARIL) 25 MG tablet    Sig: Take 1 tablet (25 mg total) by mouth 3 (three) times daily as needed.    Dispense:  60 tablet    Refill:  1    Follow-up: Return in about 3 months (around 07/12/2021) for Disease management.       Hoy Register, MD, FAAFP. [facility] [place], [place] [phone]   04/13/2021, 3:25 PM

## 2021-04-21 IMAGING — CT CT CERVICAL SPINE W/O CM
4 series · 15 of 33 positions shown, 18 images · non-contrast
Comparison: None.

CLINICAL DATA: Unwitnessed fall with seizure-like activity.

Neck trauma, uncomplicated (NEXUS/CCR neg) (Age 16-64y)
EXAM:
CT CERVICAL SPINE WITHOUT CONTRAST
TECHNIQUE: Multidetector CT imaging of the cervical spine was performed without
intravenous contrast. Multiplanar CT image reconstructions were also
generated.

[Series 5: c_spine 2.0 3 st · axial · 0.45mm/px · z∈[-242,-108]mm · 5 of 101 slices shown, 7 images]
[im 17/101  soft-tissue]
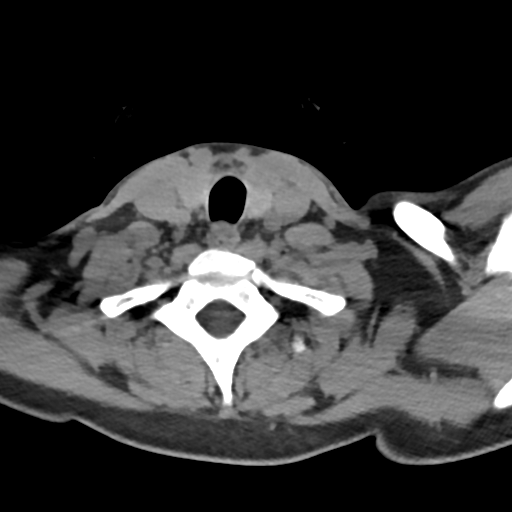
[im 17/101  bone]
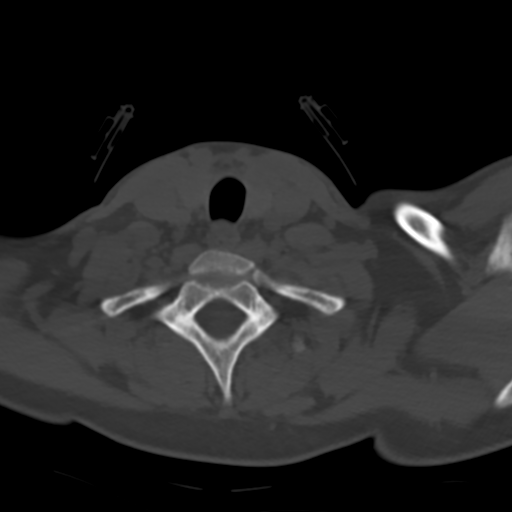
[im 34/101  bone]
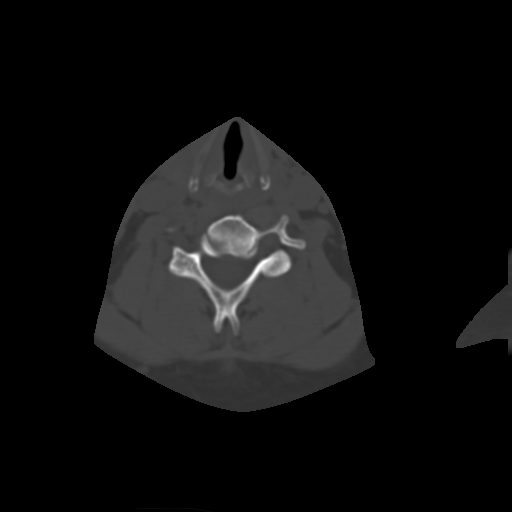
[im 51/101  bone]
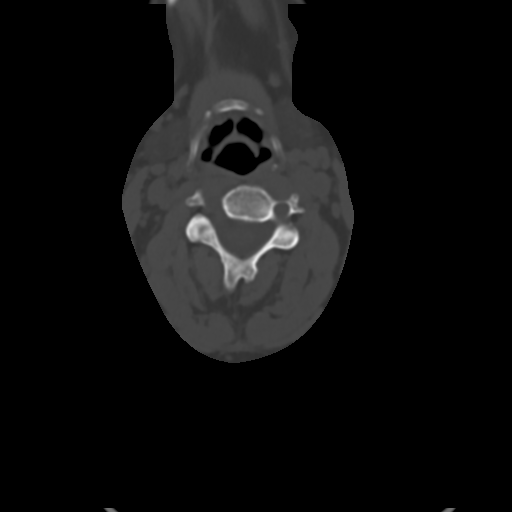
[im 67/101  bone]
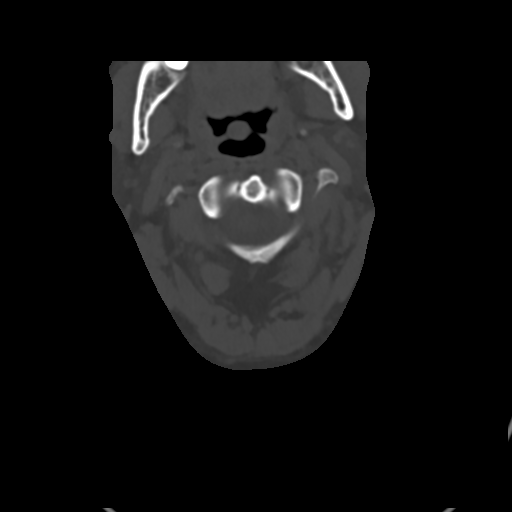
[im 84/101  soft-tissue]
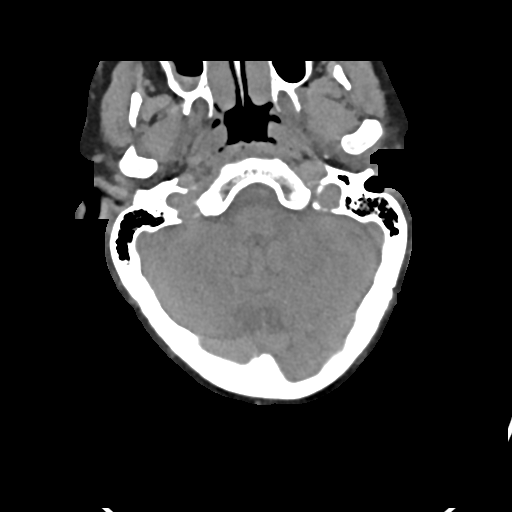
[im 84/101  bone]
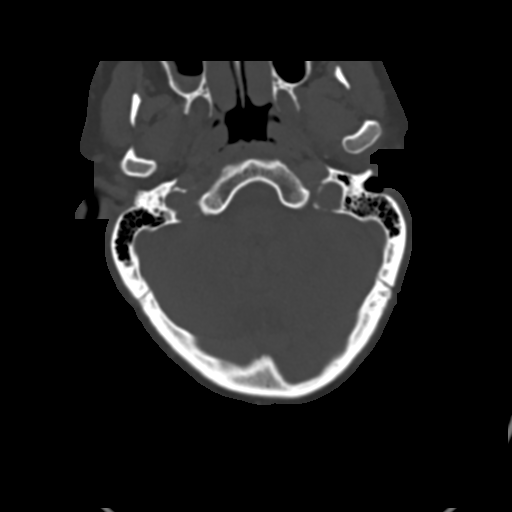

[Series 6: coronal bone · coronal · 0.29mm/px · 3 of 61 slices shown]
[im 13/61  bone]
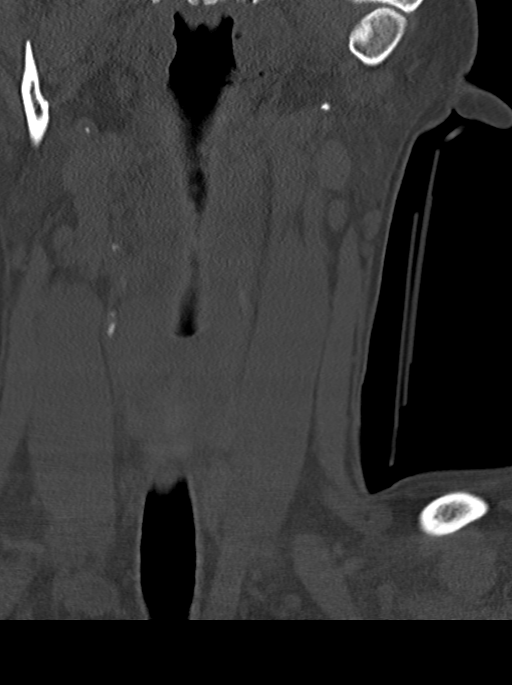
[im 25/61  bone]
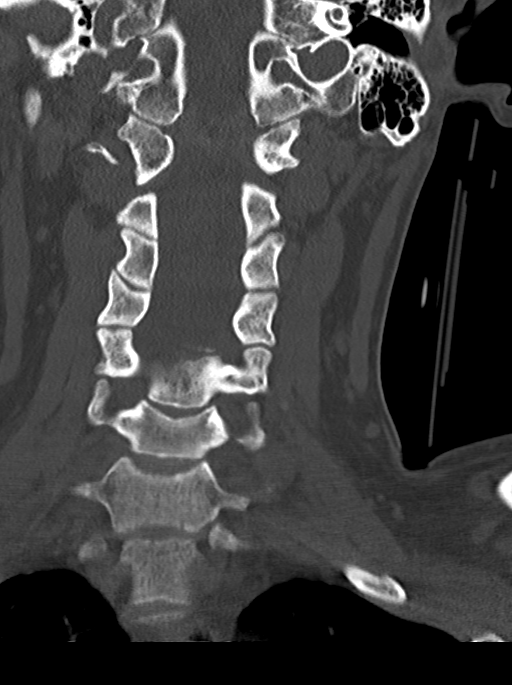
[im 37/61  bone]
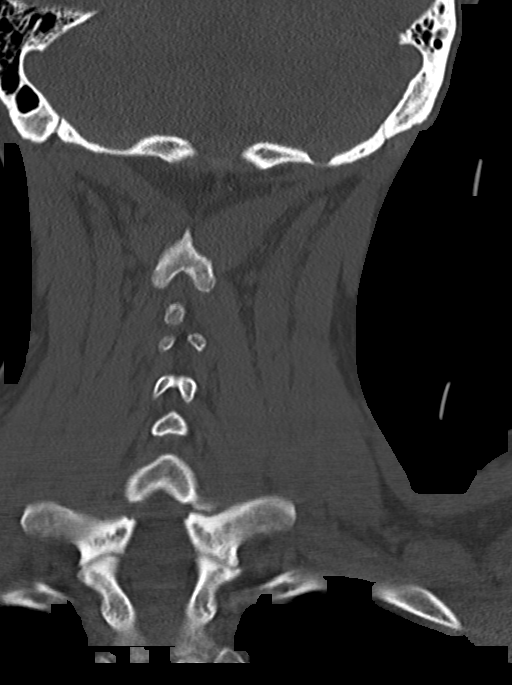

[Series 7: sagittal bone · sagittal · 0.36mm/px · 5 of 61 slices shown, 6 images]
[im 21/61  bone]
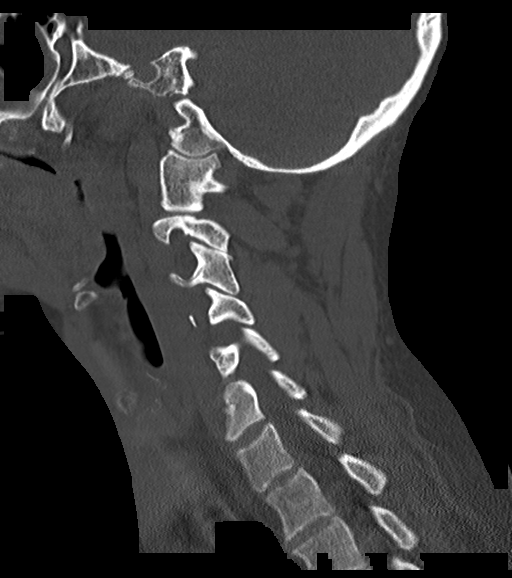
[im 26/61  bone]
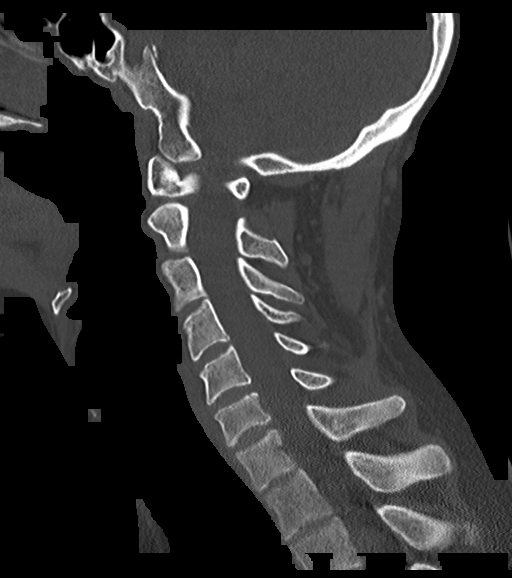
[im 31/61  soft-tissue]
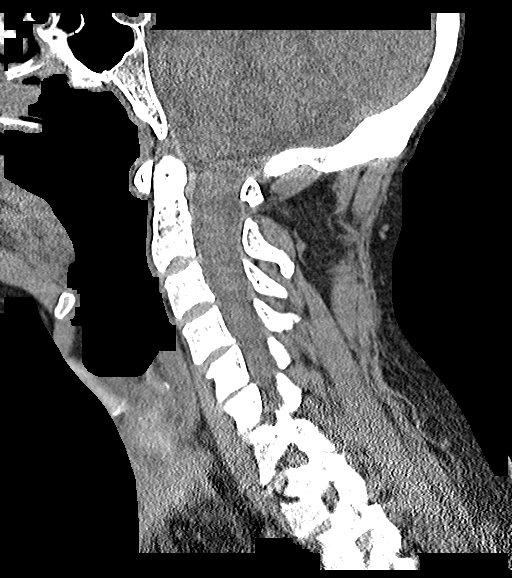
[im 31/61  bone]
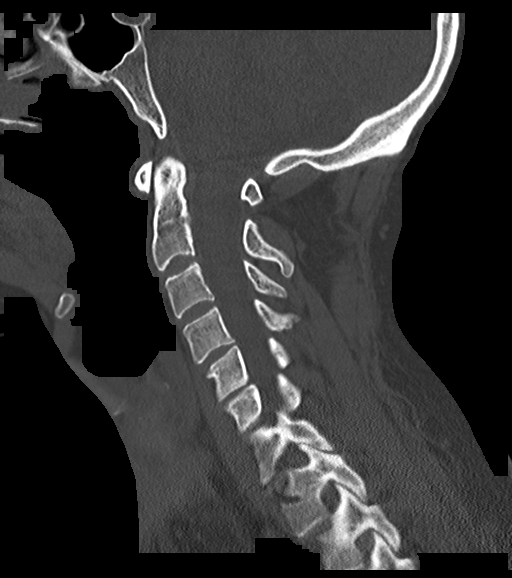
[im 36/61  bone]
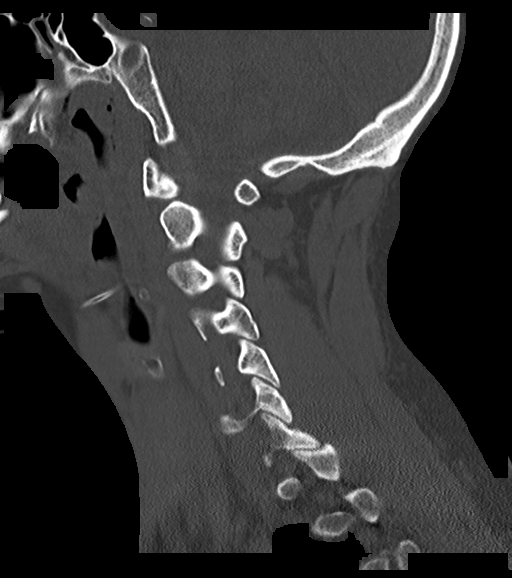
[im 41/61  bone]
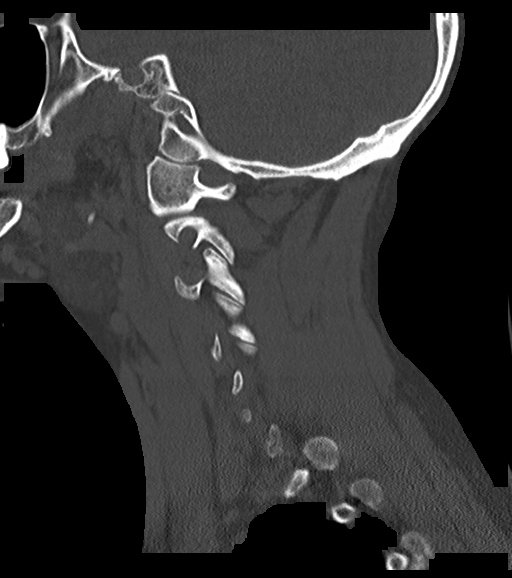

[Series 9: orthogonal axials st · axial · 0.21mm/px · z∈[-267,-239]mm · 2 of 93 slices shown]
[im 16/93  bone]
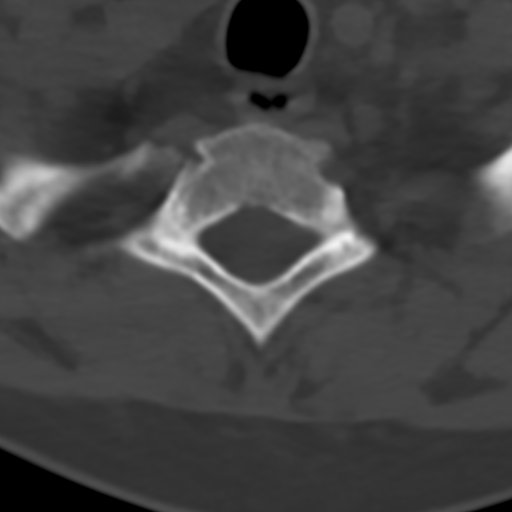
[im 31/93  bone]
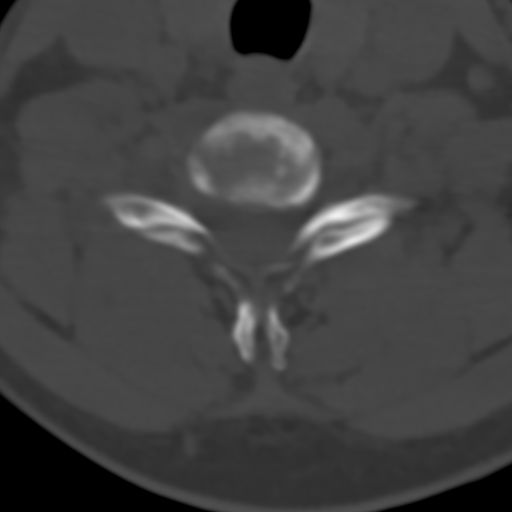

[15 of 33 positions shown; findings below may reference images not displayed]

FINDINGS: Alignment: Normal.

Skull base and vertebrae: No acute fracture. Vertebral body heights
are maintained. The dens and skull base are intact.

Soft tissues and spinal canal: No prevertebral fluid or swelling. No
visible canal hematoma.

Disc levels:  Disc spaces are preserved.

Upper chest: No acute findings.

Other: None.
IMPRESSION: No fracture or subluxation of the cervical spine.

## 2021-04-21 IMAGING — CT CT HEAD W/O CM
3 of 4 series · 14 of 47 positions shown, 16 images · non-contrast
Comparison: None.

CLINICAL DATA: Seizure, nontraumatic (Age 18-40y)

Unwitnessed fall with seizure-like activity.
EXAM:
CT HEAD WITHOUT CONTRAST
TECHNIQUE: Contiguous axial images were obtained from the base of the skull
through the vertex without intravenous contrast.

[Series 4: head 2.0 h70h · axial · 0.42mm/px · z∈[-110,+30]mm · 8 of 88 slices shown, 10 images]
[im 9/88  brain]
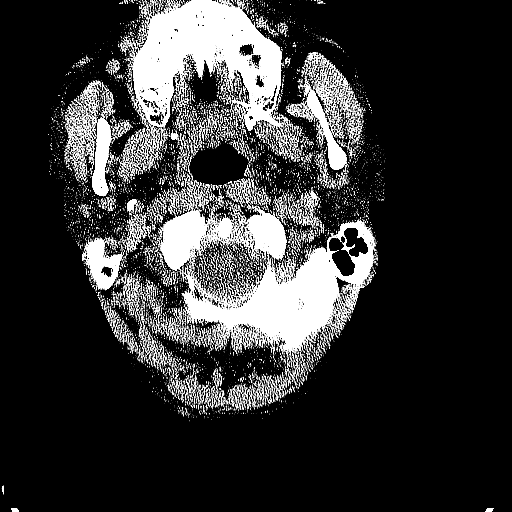
[im 9/88  bone]
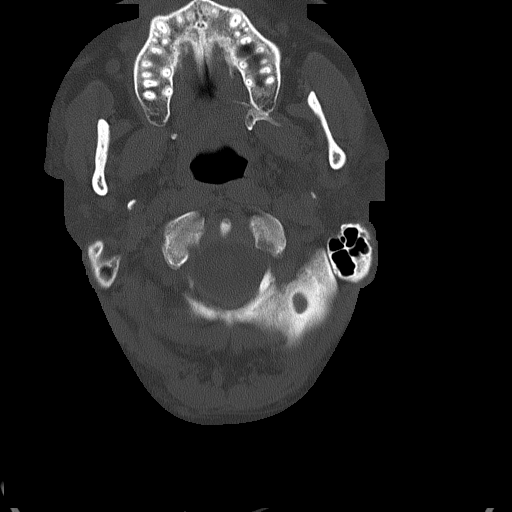
[im 18/88  brain]
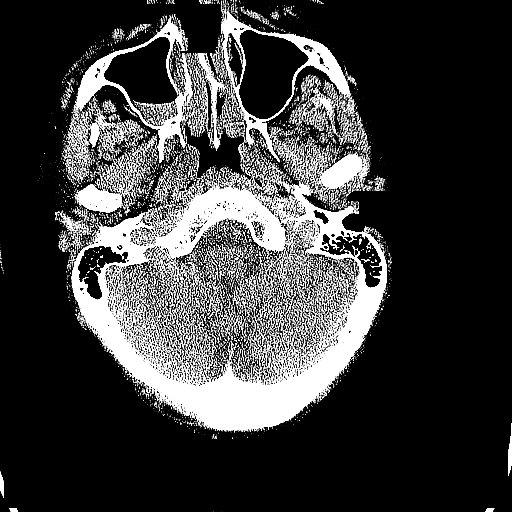
[im 27/88  brain]
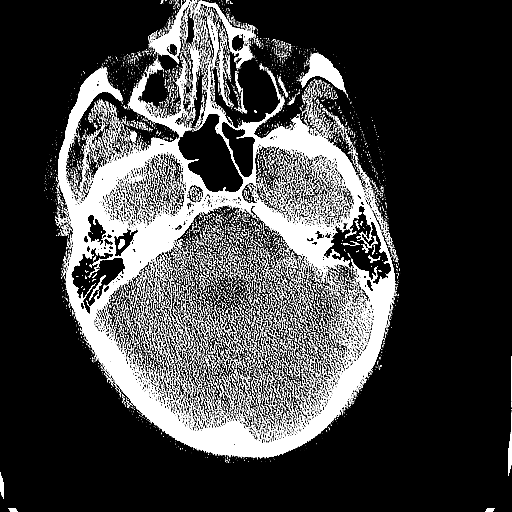
[im 40/88  brain]
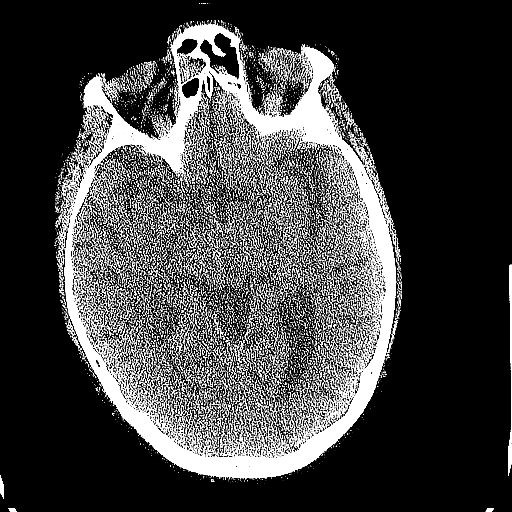
[im 48/88  brain]
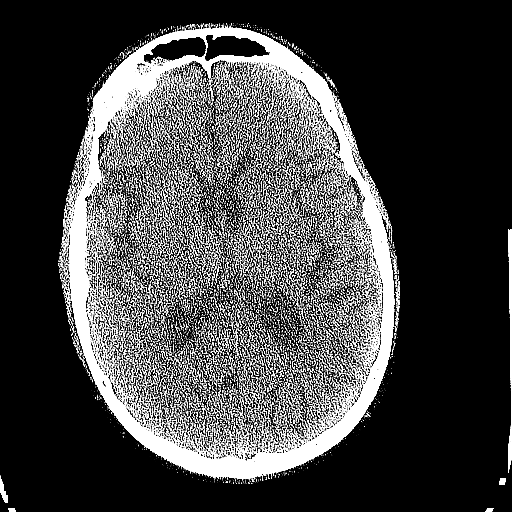
[im 48/88  bone]
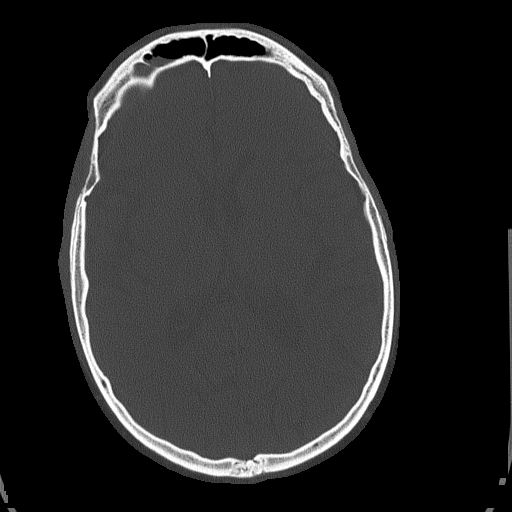
[im 61/88  brain]
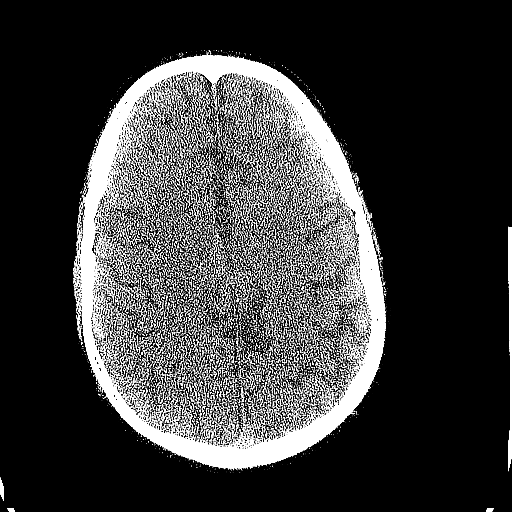
[im 70/88  brain]
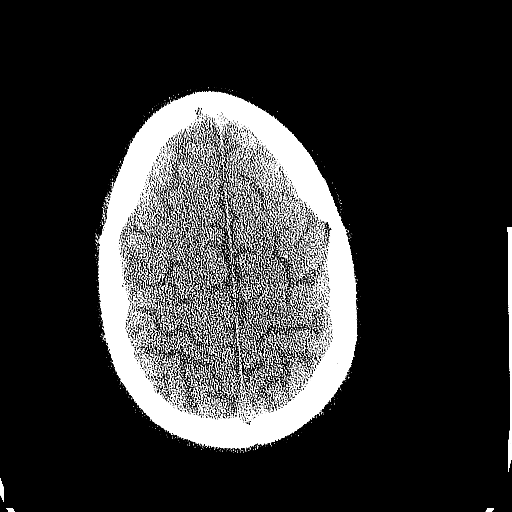
[im 79/88  brain]
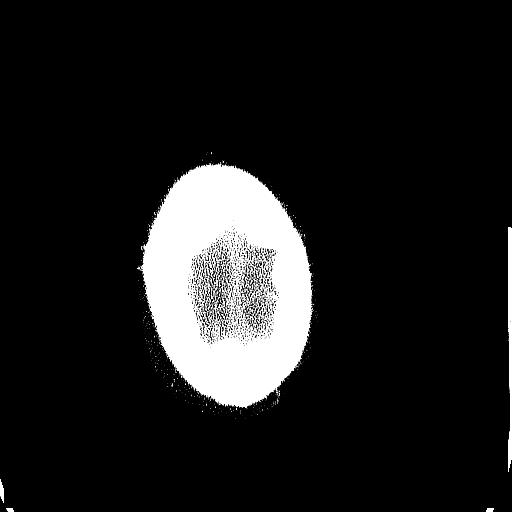

[Series 5: head 3.0 mpr cor · coronal · 0.34mm/px · 3 of 86 slices shown]
[im 29/86  brain]
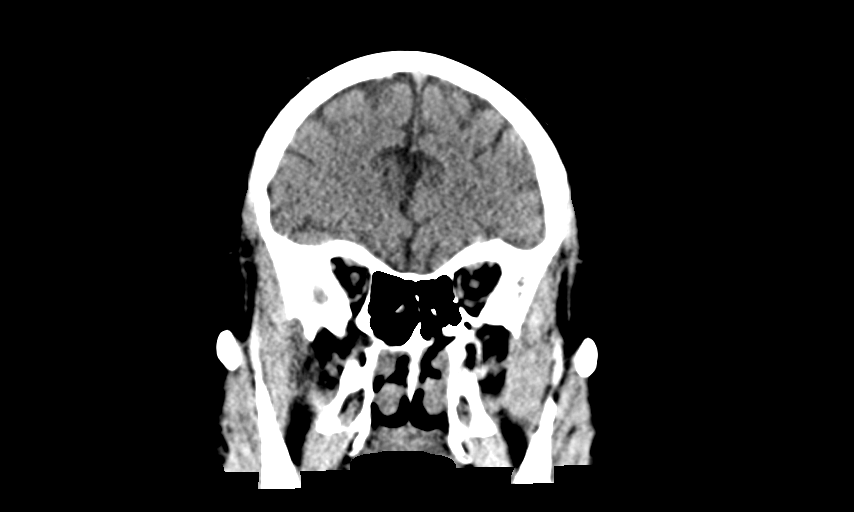
[im 38/86  brain]
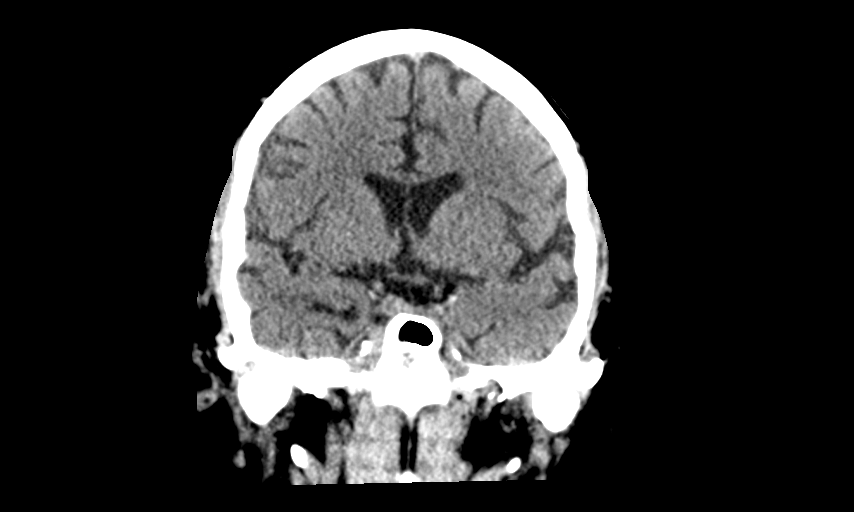
[im 48/86  brain]
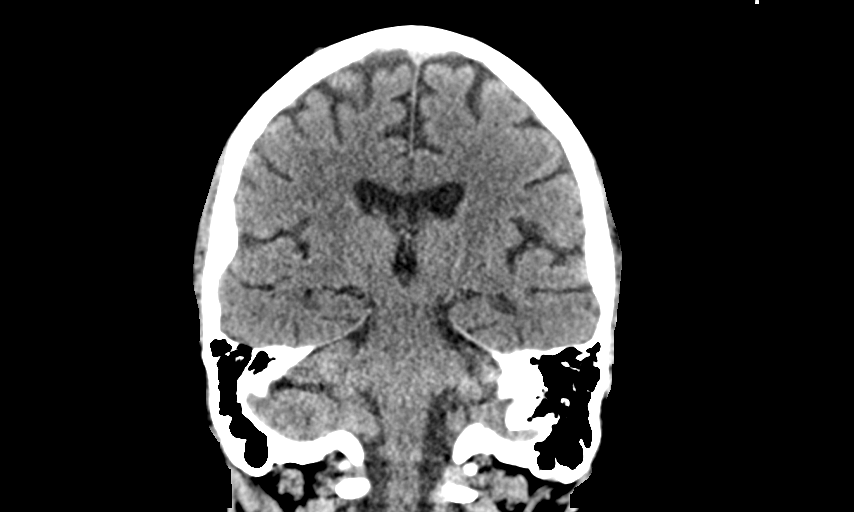

[Series 6: head 3.0 mpr sag · sagittal · 0.40mm/px · 3 of 67 slices shown]
[im 23/67  brain]
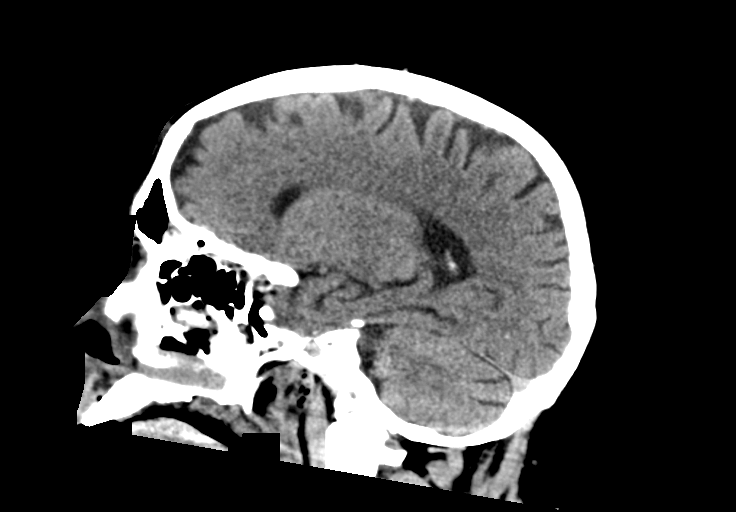
[im 34/67  brain]
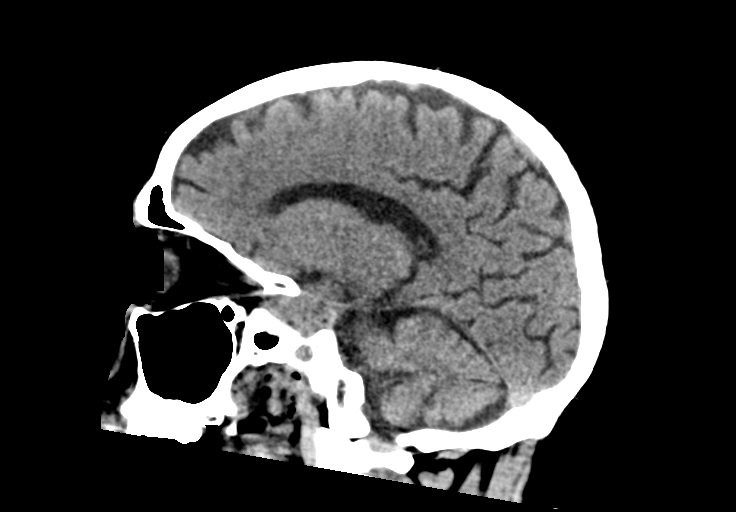
[im 45/67  brain]
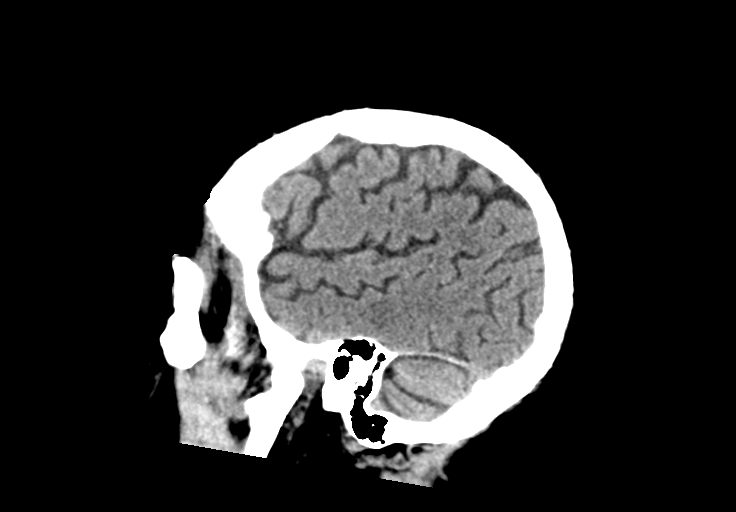

[14 of 47 positions shown; findings below may reference images not displayed]

FINDINGS: Brain: Mild generalized atrophy, but advanced for age. No
intracranial hemorrhage, mass effect, or midline shift. No
hydrocephalus. The basilar cisterns are patent. No evidence of
territorial infarct or acute ischemia. No extra-axial or
intracranial fluid collection.

Vascular: No hyperdense vessel or unexpected calcification.

Skull: No fracture or focal lesion.

Sinuses/Orbits: Assessed on concurrent face CT, reported separately.

Other: None.
IMPRESSION: 1. No acute intracranial abnormality. No skull fracture.
2. Mild generalized atrophy, advanced for age.

## 2021-05-23 ENCOUNTER — Other Ambulatory Visit: Payer: Self-pay

## 2021-05-23 ENCOUNTER — Ambulatory Visit (HOSPITAL_COMMUNITY)
Admission: EM | Admit: 2021-05-23 | Discharge: 2021-05-24 | Disposition: A | Discharge status: Other Acute Inpatient Hospital | Payer: No Payment, Other | Attending: Urology | Admitting: Urology

## 2021-05-23 DIAGNOSIS — F332 Major depressive disorder, recurrent severe without psychotic features: Secondary | ICD-10-CM | POA: Insufficient documentation

## 2021-05-23 DIAGNOSIS — F419 Anxiety disorder, unspecified: Secondary | ICD-10-CM | POA: Insufficient documentation

## 2021-05-23 DIAGNOSIS — R45851 Suicidal ideations: Secondary | ICD-10-CM | POA: Insufficient documentation

## 2021-05-23 DIAGNOSIS — F1010 Alcohol abuse, uncomplicated: Secondary | ICD-10-CM | POA: Insufficient documentation

## 2021-05-23 DIAGNOSIS — R454 Irritability and anger: Secondary | ICD-10-CM | POA: Insufficient documentation

## 2021-05-24 ENCOUNTER — Emergency Department (HOSPITAL_COMMUNITY): Payer: Self-pay

## 2021-05-24 ENCOUNTER — Encounter (HOSPITAL_COMMUNITY): Payer: Self-pay | Admitting: Psychiatry

## 2021-05-24 ENCOUNTER — Other Ambulatory Visit: Payer: Self-pay

## 2021-05-24 ENCOUNTER — Emergency Department (HOSPITAL_COMMUNITY)
Admission: EM | Admit: 2021-05-24 | Discharge: 2021-05-24 | Disposition: A | Discharge status: Other Acute Inpatient Hospital | Payer: Self-pay | Attending: Emergency Medicine | Admitting: Emergency Medicine

## 2021-05-24 ENCOUNTER — Encounter (HOSPITAL_COMMUNITY): Payer: Self-pay | Admitting: Registered Nurse

## 2021-05-24 ENCOUNTER — Inpatient Hospital Stay (HOSPITAL_COMMUNITY)
Admission: AD | Admit: 2021-05-24 | Discharge: 2021-05-30 | DRG: 885 | Disposition: A | Discharge status: Home / Self Care | Payer: Federal, State, Local not specified - Other | Source: Intra-hospital | Attending: Psychiatry | Admitting: Psychiatry

## 2021-05-24 DIAGNOSIS — F10239 Alcohol dependence with withdrawal, unspecified: Secondary | ICD-10-CM | POA: Diagnosis present

## 2021-05-24 DIAGNOSIS — R45851 Suicidal ideations: Secondary | ICD-10-CM | POA: Diagnosis present

## 2021-05-24 DIAGNOSIS — F411 Generalized anxiety disorder: Secondary | ICD-10-CM | POA: Diagnosis present

## 2021-05-24 DIAGNOSIS — F1020 Alcohol dependence, uncomplicated: Secondary | ICD-10-CM | POA: Diagnosis present

## 2021-05-24 DIAGNOSIS — F332 Major depressive disorder, recurrent severe without psychotic features: Principal | ICD-10-CM | POA: Diagnosis present

## 2021-05-24 DIAGNOSIS — F1024 Alcohol dependence with alcohol-induced mood disorder: Secondary | ICD-10-CM | POA: Diagnosis not present

## 2021-05-24 DIAGNOSIS — Y908 Blood alcohol level of 240 mg/100 ml or more: Secondary | ICD-10-CM | POA: Diagnosis present

## 2021-05-24 DIAGNOSIS — F32A Depression, unspecified: Secondary | ICD-10-CM | POA: Diagnosis not present

## 2021-05-24 DIAGNOSIS — Z79899 Other long term (current) drug therapy: Secondary | ICD-10-CM | POA: Diagnosis not present

## 2021-05-24 DIAGNOSIS — F10929 Alcohol use, unspecified with intoxication, unspecified: Principal | ICD-10-CM | POA: Diagnosis present

## 2021-05-24 DIAGNOSIS — F329 Major depressive disorder, single episode, unspecified: Secondary | ICD-10-CM | POA: Insufficient documentation

## 2021-05-24 DIAGNOSIS — Z046 Encounter for general psychiatric examination, requested by authority: Secondary | ICD-10-CM | POA: Insufficient documentation

## 2021-05-24 DIAGNOSIS — F17210 Nicotine dependence, cigarettes, uncomplicated: Secondary | ICD-10-CM | POA: Insufficient documentation

## 2021-05-24 DIAGNOSIS — Z20822 Contact with and (suspected) exposure to covid-19: Secondary | ICD-10-CM | POA: Insufficient documentation

## 2021-05-24 DIAGNOSIS — F314 Bipolar disorder, current episode depressed, severe, without psychotic features: Secondary | ICD-10-CM | POA: Diagnosis present

## 2021-05-24 DIAGNOSIS — Z818 Family history of other mental and behavioral disorders: Secondary | ICD-10-CM | POA: Diagnosis not present

## 2021-05-24 DIAGNOSIS — F1420 Cocaine dependence, uncomplicated: Secondary | ICD-10-CM | POA: Diagnosis not present

## 2021-05-24 DIAGNOSIS — F1094 Alcohol use, unspecified with alcohol-induced mood disorder: Secondary | ICD-10-CM | POA: Diagnosis not present

## 2021-05-24 DIAGNOSIS — F1994 Other psychoactive substance use, unspecified with psychoactive substance-induced mood disorder: Secondary | ICD-10-CM | POA: Diagnosis not present

## 2021-05-24 LAB — CBC
HCT: 46.7 % (ref 39.0–52.0)
Hemoglobin: 16.2 g/dL (ref 13.0–17.0)
MCH: 34.7 pg — ABNORMAL HIGH (ref 26.0–34.0)
MCHC: 34.7 g/dL (ref 30.0–36.0)
MCV: 100 fL (ref 80.0–100.0)
Platelets: 354 10*3/uL (ref 150–400)
RBC: 4.67 MIL/uL (ref 4.22–5.81)
RDW: 13 % (ref 11.5–15.5)
WBC: 7.5 10*3/uL (ref 4.0–10.5)
nRBC: 0 % (ref 0.0–0.2)

## 2021-05-24 LAB — RAPID URINE DRUG SCREEN, HOSP PERFORMED
Amphetamines: NOT DETECTED
Barbiturates: NOT DETECTED
Benzodiazepines: NOT DETECTED
Cocaine: POSITIVE — AB
Opiates: NOT DETECTED
Tetrahydrocannabinol: NOT DETECTED

## 2021-05-24 LAB — SEDIMENTATION RATE: Sed Rate: 2 mm/hr (ref 0–16)

## 2021-05-24 LAB — COMPREHENSIVE METABOLIC PANEL
ALT: 14 U/L (ref 0–44)
AST: 24 U/L (ref 15–41)
Albumin: 4.4 g/dL (ref 3.5–5.0)
Alkaline Phosphatase: 66 U/L (ref 38–126)
Anion gap: 15 (ref 5–15)
BUN: 5 mg/dL — ABNORMAL LOW (ref 6–20)
CO2: 18 mmol/L — ABNORMAL LOW (ref 22–32)
Calcium: 9.3 mg/dL (ref 8.9–10.3)
Chloride: 104 mmol/L (ref 98–111)
Creatinine, Ser: 0.97 mg/dL (ref 0.61–1.24)
GFR, Estimated: >60 mL/min (ref >60)
Glucose, Bld: 100 mg/dL — ABNORMAL HIGH (ref 70–99)
Potassium: 4.1 mmol/L (ref 3.5–5.1)
Sodium: 137 mmol/L (ref 135–145)
Total Bilirubin: 0.5 mg/dL (ref 0.3–1.2)
Total Protein: 8.2 g/dL — ABNORMAL HIGH (ref 6.5–8.1)

## 2021-05-24 LAB — TROPONIN I (HIGH SENSITIVITY)
Troponin I (High Sensitivity): <2 ng/L (ref <18)
Troponin I (High Sensitivity): <2 ng/L (ref <18)

## 2021-05-24 LAB — C-REACTIVE PROTEIN: CRP: 1 mg/dL — ABNORMAL HIGH (ref <1.0)

## 2021-05-24 LAB — RESP PANEL BY RT-PCR (FLU A&B, COVID) ARPGX2
Influenza A by PCR: NEGATIVE
Influenza B by PCR: NEGATIVE
SARS Coronavirus 2 by RT PCR: NEGATIVE

## 2021-05-24 LAB — ACETAMINOPHEN LEVEL: Acetaminophen (Tylenol), Serum: <10 ug/mL — ABNORMAL LOW (ref 10–30)

## 2021-05-24 LAB — SALICYLATE LEVEL: Salicylate Lvl: <7 mg/dL — ABNORMAL LOW (ref 7.0–30.0)

## 2021-05-24 LAB — ETHANOL: Alcohol, Ethyl (B): 279 mg/dL — ABNORMAL HIGH (ref <10)

## 2021-05-24 MED ORDER — THIAMINE HCL 100 MG/ML IJ SOLN: 100.0000 mg | Freq: Every day | INTRAMUSCULAR | Status: DC

## 2021-05-24 MED ORDER — FOLIC ACID 1 MG PO TABS
1.0000 mg | ORAL_TABLET | Freq: Every day | ORAL | Status: DC
  Administered 2021-05-24 – 2021-05-30 (×7): 1 mg via ORAL
  Filled 2021-05-24: qty 7
  Filled 2021-05-24 (×3): qty 1
  Filled 2021-05-24: qty 7
  Filled 2021-05-24 (×4): qty 1
  Filled 2021-05-24: qty 7

## 2021-05-24 MED ORDER — THIAMINE HCL 100 MG PO TABS
100.0000 mg | ORAL_TABLET | Freq: Every day | ORAL | Status: DC
  Administered 2021-05-24 – 2021-05-30 (×7): 100 mg via ORAL
  Filled 2021-05-24: qty 1
  Filled 2021-05-24: qty 7
  Filled 2021-05-24 (×3): qty 1
  Filled 2021-05-24: qty 7
  Filled 2021-05-24: qty 1
  Filled 2021-05-24: qty 7
  Filled 2021-05-24: qty 1

## 2021-05-24 MED ORDER — CHLORDIAZEPOXIDE HCL 25 MG PO CAPS
25.0000 mg | ORAL_CAPSULE | Freq: Four times a day (QID) | ORAL | Status: DC | PRN
  Administered 2021-05-24 – 2021-05-28 (×5): 25 mg via ORAL
  Filled 2021-05-24 (×5): qty 1

## 2021-05-24 MED ORDER — MAGNESIUM HYDROXIDE 400 MG/5ML PO SUSP: 30.0000 mL | Freq: Every day | ORAL | Status: DC | PRN

## 2021-05-24 MED ORDER — SERTRALINE HCL 25 MG PO TABS
25.0000 mg | ORAL_TABLET | Freq: Every day | ORAL | Status: DC
  Administered 2021-05-24 – 2021-05-29 (×6): 25 mg via ORAL
  Filled 2021-05-24 (×4): qty 1
  Filled 2021-05-24: qty 7
  Filled 2021-05-24 (×2): qty 1

## 2021-05-24 MED ORDER — GABAPENTIN 300 MG PO CAPS
300.0000 mg | ORAL_CAPSULE | Freq: Three times a day (TID) | ORAL | Status: DC
  Administered 2021-05-24 – 2021-05-29 (×14): 300 mg via ORAL
  Filled 2021-05-24 (×5): qty 1
  Filled 2021-05-24: qty 21
  Filled 2021-05-24 (×6): qty 1
  Filled 2021-05-24: qty 21
  Filled 2021-05-24 (×3): qty 1
  Filled 2021-05-24: qty 21
  Filled 2021-05-24 (×2): qty 1

## 2021-05-24 MED ORDER — HYDROXYZINE HCL 25 MG PO TABS: 25.0000 mg | ORAL_TABLET | Freq: Three times a day (TID) | ORAL | Status: DC | PRN

## 2021-05-24 MED ORDER — PANTOPRAZOLE SODIUM 40 MG PO TBEC
40.0000 mg | DELAYED_RELEASE_TABLET | Freq: Every day | ORAL | Status: DC
  Administered 2021-05-24 – 2021-05-30 (×7): 40 mg via ORAL
  Filled 2021-05-24: qty 7
  Filled 2021-05-24: qty 1
  Filled 2021-05-24: qty 7
  Filled 2021-05-24 (×4): qty 1
  Filled 2021-05-24: qty 7
  Filled 2021-05-24: qty 1

## 2021-05-24 MED ORDER — LORAZEPAM 2 MG/ML IJ SOLN: 0.0000 mg | Freq: Two times a day (BID) | INTRAMUSCULAR | Status: DC

## 2021-05-24 MED ORDER — LORAZEPAM 1 MG PO TABS
0.0000 mg | ORAL_TABLET | Freq: Four times a day (QID) | ORAL | Status: DC
  Administered 2021-05-24: 1 mg via ORAL
  Filled 2021-05-24: qty 1

## 2021-05-24 MED ORDER — SERTRALINE HCL 50 MG PO TABS
50.0000 mg | ORAL_TABLET | Freq: Every day | ORAL | Status: DC
  Administered 2021-05-24: 50 mg via ORAL
  Filled 2021-05-24: qty 1

## 2021-05-24 MED ORDER — LORAZEPAM 1 MG PO TABS: 0.0000 mg | ORAL_TABLET | Freq: Two times a day (BID) | ORAL | Status: DC

## 2021-05-24 MED ORDER — LORAZEPAM 2 MG/ML IJ SOLN: 0.0000 mg | Freq: Four times a day (QID) | INTRAMUSCULAR | Status: DC

## 2021-05-24 MED ORDER — ALUM & MAG HYDROXIDE-SIMETH 200-200-20 MG/5ML PO SUSP: 30.0000 mL | ORAL | Status: DC | PRN

## 2021-05-24 MED ORDER — TRAZODONE HCL 50 MG PO TABS
50.0000 mg | ORAL_TABLET | Freq: Every evening | ORAL | Status: DC | PRN
  Administered 2021-05-24 – 2021-05-25 (×2): 50 mg via ORAL
  Filled 2021-05-24 (×2): qty 1

## 2021-05-24 MED ORDER — ACETAMINOPHEN 325 MG PO TABS
650.0000 mg | ORAL_TABLET | Freq: Four times a day (QID) | ORAL | Status: DC | PRN
  Administered 2021-05-25: 650 mg via ORAL
  Filled 2021-05-24: qty 2

## 2021-05-24 MED ORDER — THIAMINE HCL 100 MG PO TABS
100.0000 mg | ORAL_TABLET | Freq: Every day | ORAL | Status: DC
  Administered 2021-05-24: 100 mg via ORAL
  Filled 2021-05-24: qty 1

## 2021-05-24 MED ORDER — HYDROXYZINE HCL 25 MG PO TABS
25.0000 mg | ORAL_TABLET | Freq: Three times a day (TID) | ORAL | Status: DC | PRN
  Administered 2021-05-24 – 2021-05-27 (×7): 25 mg via ORAL
  Filled 2021-05-24 (×7): qty 1

## 2021-05-24 NOTE — ED Notes (Signed)
Secretary called [employer] for transport to [facility] at this time

## 2021-05-24 NOTE — ED Provider Notes (Signed)
[facility] Provider Note  CSN: [id number] Arrival date & time: 05/24/21 0116  Chief Complaint(s) Suicidal, Chest Pain, and Back Pain  HPI John Vaughan is a 30 y.o. male with a past medical history listed below here under [name] for severe depression and suicidal ideation.  Recent passing of his father November. Patient has a history of daily alcohol use which worsened after the death of his father. Also using cocaine. Patient was reportedly found holding a rifle against his head earlier tonight and [name] by his mother.  Patient reports that he had a fever 1 day ago which has not returned.  Is having diffuse myalgias with chronic back pain chest pain abdominal pain back pain.  Pain is described as a tingling/prickling sensation.  No bladder/bowel incontinence.  Patient denies SI, HI, AVH.  HPI  Past Medical History Past Medical History:  Diagnosis Date  . Alcohol abuse   . Anxiety   . Depression    Patient Active Problem List   Diagnosis Date Noted  . Depression 03/20/2021  . Alcohol withdrawal (HCC) 09/06/2020  . 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 Homicidal ideation   . GAD (generalized anxiety disorder) 03/09/2019  . Alcohol use disorder, severe, dependence (HCC) 03/09/2019  . Mild episode of recurrent major depressive disorder (HCC) 03/09/2019   Home Medication(s) Prior to Admission medications   Medication Sig Start Date End Date Taking? Authorizing Provider  clonazePAM (KLONOPIN) 0.5 MG tablet Take 1 tablet (0.5 mg total) by mouth 2 (two) times daily as needed for anxiety. 04/22/20  Yes Henderly, Britni A, PA-C  gabapentin (NEURONTIN) 300 MG capsule Take 900 mg by mouth 3 (three) times daily. 04/14/20  Yes [provider]  Multiple Vitamins-Minerals (MULTIVITAMIN ADULTS PO) Take 1 tablet by mouth daily.   Yes [provider]  ondansetron (ZOFRAN) 4 MG tablet  Take 1 tablet (4 mg total) by mouth every 6 (six) hours. Patient taking differently: Take 4 mg by mouth every 8 (eight) hours as needed for nausea or vomiting. 05/27/20  Yes Carroll Sage, PA-C  pantoprazole (PROTONIX) 40 MG tablet Take 40 mg by mouth daily as needed (heartburn). 12/08/20  Yes [provider]  sertraline (ZOLOFT) 50 MG tablet Take 1 tablet (50 mg total) by mouth daily. 04/06/21  Yes Storm Frisk, MD  chlordiazePOXIDE (LIBRIUM) 25 MG capsule Take 25 mg by mouth 3 (three) times daily as needed for anxiety. Patient not taking: Reported on 05/24/2021    [provider]  hydrOXYzine (ATARAX/VISTARIL) 25 MG tablet Take 1 tablet (25 mg total) by mouth 3 (three) times daily as needed. Patient not taking: Reported on 05/24/2021 04/12/21   Hoy Register, MD  naltrexone (DEPADE) 50 MG tablet Take 1 tablet (50 mg total) by mouth daily. Patient not taking: Reported on 05/24/2021 04/12/21   Hoy Register, MD  Past Surgical History No past surgical history on file. Family History Family History  Problem Relation Age of Onset  . Anxiety disorder Mother   . Alcohol abuse Father   . Anxiety disorder Maternal Aunt   . Anxiety disorder Maternal Grandmother   . Alcohol abuse Paternal Grandfather     Social History Social History   Tobacco Use  . Smoking status: Current Every Day Smoker    Packs/day: 1.00    Types: Cigarettes  . Smokeless tobacco: Former Neurosurgeon    Types: Snuff  Vaping Use  . Vaping Use: Never used  Substance Use Topics  . Alcohol use: Yes    Alcohol/week: 18.0 standard drinks    Types: 18 Cans of beer per week    Comment: states that he drinks 2-4 beers daily  . Drug use: Not Currently    Types: Marijuana   Allergies Patient has no known allergies.  Review of Systems Review of Systems All other systems are  reviewed and are negative for acute change except as noted in the HPI  Physical Exam Vital Signs  I have reviewed the triage vital signs BP 109/60 (BP Location: Left Arm)   Pulse 94   Temp 98.1 F (36.7 C) (Oral)   Resp 17   Ht 6' (1.829 m)   Wt 83.4 kg   SpO2 93%   BMI 24.94 kg/m   Physical Exam Vitals reviewed.  Constitutional:      General: He is not in acute distress.    Appearance: He is well-developed. He is not diaphoretic.  HENT:     Head: Normocephalic and atraumatic.     Nose: Nose normal.  Eyes:     General: No scleral icterus.       Right eye: No discharge.        Left eye: No discharge.     Conjunctiva/sclera: Conjunctivae normal.     Pupils: Pupils are equal, round, and reactive to light.  Cardiovascular:     Rate and Rhythm: Normal rate and regular rhythm.     Heart sounds: No murmur heard. No friction rub. No gallop.   Pulmonary:     Effort: Pulmonary effort is normal. No respiratory distress.     Breath sounds: Normal breath sounds. No stridor. No rales.  Abdominal:     General: There is no distension.     Palpations: Abdomen is soft.     Tenderness: There is no abdominal tenderness.  Musculoskeletal:        General: No tenderness.     Cervical back: Normal range of motion and neck supple.     Comments: Patient with tenderness to palpation throughout his entire body. Vigorously jerks with light touch. Moves all extremities with 5 out of 5 strength and symmetric  Skin:    General: Skin is warm and dry.     Findings: No erythema or rash.  Neurological:     Mental Status: He is alert and oriented to person, place, and time.     Comments: Moves all extremities with 5 out of 5 strength and symmetric. Sensation intact. Antalgic, but stable gait.     ED Results and Treatments Labs (all labs ordered are listed, but only abnormal results are displayed) Labs Reviewed  COMPREHENSIVE METABOLIC PANEL - Abnormal; Notable for the following components:       Result Value   CO2 18 (*)    Glucose, Bld 100 (*)    BUN 5 (*)    Total Protein  8.2 (*)    All other components within normal limits  ETHANOL - Abnormal; Notable for the following components:   Alcohol, Ethyl (B) 279 (*)    All other components within normal limits  SALICYLATE LEVEL - Abnormal; Notable for the following components:   Salicylate Lvl <7.0 (*)    All other components within normal limits  ACETAMINOPHEN LEVEL - Abnormal; Notable for the following components:   Acetaminophen (Tylenol), Serum <10 (*)    All other components within normal limits  CBC - Abnormal; Notable for the following components:   MCH 34.7 (*)    All other components within normal limits  RAPID URINE DRUG SCREEN, HOSP PERFORMED - Abnormal; Notable for the following components:   Cocaine POSITIVE (*)    All other components within normal limits  C-REACTIVE PROTEIN - Abnormal; Notable for the following components:   CRP 1.0 (*)    All other components within normal limits  RESP PANEL BY RT-PCR (FLU A&B, COVID) ARPGX2  SEDIMENTATION RATE  TROPONIN I (HIGH SENSITIVITY)  TROPONIN I (HIGH SENSITIVITY)                                                                                                                         EKG    Radiology DG Chest 2 View  Result Date: 05/24/2021 CLINICAL DATA:  Chest pain short of breath EXAM: CHEST - 2 VIEW COMPARISON:  03/19/2021, CT 03/02/2021 FINDINGS: Chronic compression deformity at T7 and at T6. No focal opacity or pleural effusion. Normal cardiomediastinal silhouette. No pneumothorax IMPRESSION: No active cardiopulmonary disease. Electronically Signed   By: Jasmine Pang M.D.   On: 05/24/2021 02:50   CT Thoracic Spine Wo Contrast  Result Date: 05/24/2021 CLINICAL DATA:  30 year old male with mid back pain. Chest pain and shortness of breath. EXAM: CT THORACIC SPINE WITHOUT CONTRAST TECHNIQUE: Multidetector CT images of the thoracic were obtained using the  standard protocol without intravenous contrast. COMPARISON:  Two-view chest radiographs 0238 hours today. CT thoracolumbar spine 03/02/2021. FINDINGS: Limited cervical spine imaging: Negative, Cervicothoracic junction alignment is within normal limits. Thoracic spine segmentation: Normal, the same numbering system used in March. Alignment: Stable thoracic kyphosis, mildly exaggerated about the T7 level. Vertebrae: Mild T5 superior endplate compression, mild T6 vertebral body compression, and moderate T7 vertebral body compression appear stable since March. No retropulsion of bone or complicating features. Some interval sclerosis of both the T6 and T7 vertebral bodies is noted. Other thoracic levels appears stable and intact. No new osseous abnormality. Paraspinal and other soft tissues: Visible major airways are patent. Lungs appear clear allowing for mild respiratory motion. No pericardial or pleural effusion. Negative visible other noncontrast mediastinum, upper abdominal viscera. Thoracic paraspinal soft tissues are within normal limits. Disc levels: Vacuum disc at T6-T7 has developed since March. But otherwise there is no significant thoracic spine degeneration and the thoracic spinal canal appears capacious by CT without evidence of spinal stenosis. IMPRESSION: 1. Mild sclerosis of the T7 >  T6 compression fractures since March suggests they were subacute at that time. No interval loss of height. Stable mild T5 superior endplate compression also. No retropulsed bone or complicating features. 2. No new osseous abnormality in the thoracic spine. 3. Vacuum disc has developed at T6-T7 since March. No CT evidence of thoracic spinal stenosis. Electronically Signed   By: Odessa FlemingH  Hall M.D.   On: 05/24/2021 06:08    Pertinent labs & imaging results that were available during my care of the patient were reviewed by me and considered in my medical decision making (see chart for details).  Medications Ordered in  ED Medications  LORazepam (ATIVAN) injection 0-4 mg (has no administration in time range)    Or  LORazepam (ATIVAN) tablet 0-4 mg (has no administration in time range)  LORazepam (ATIVAN) injection 0-4 mg (has no administration in time range)    Or  LORazepam (ATIVAN) tablet 0-4 mg (has no administration in time range)  thiamine tablet 100 mg (has no administration in time range)    Or  thiamine (B-1) injection 100 mg (has no administration in time range)  hydrOXYzine (ATARAX/VISTARIL) tablet 25 mg (has no administration in time range)  sertraline (ZOLOFT) tablet 50 mg (has no administration in time range)                                                                                                                                    Procedures Procedures  (including critical care time)  Medical Decision Making / ED Course I have reviewed the nursing notes for this encounter and the patient's prior records (if available in EHR or on provided paperwork).   [facility] was evaluated in Emergency Department on 05/24/2021 for the symptoms described in the history of present illness. He was evaluated in the context of the global COVID-19 pandemic, which necessitated consideration that the patient might be at risk for infection with the SARS-CoV-2 virus that causes COVID-19. Institutional protocols and algorithms that pertain to the evaluation of patients at risk for COVID-19 are in a state of rapid change based on information released by regulatory bodies including the CDC and federal and state organizations. These policies and algorithms were followed during the patient's care in the ED.  Patient seen in the MSE process. Appropriate labs obtain. Confirmed alcohol intoxication with positive cocaine use.  Rest of the labs reassuring without leukocytosis or anemia.  No significant electrolyte derangements or renal insufficiency.  Chest x-ray notable for remote T7 and T6 fractures.  CT  thoracic spine obtained revealing stable fractures without any stenosis.  Patient is medically cleared for behavioral health management and disposition.       Final Clinical Impression(s) / ED Diagnoses Final diagnoses:  Involuntary commitment  Suicidal ideation  Alcoholic (HCC)      This chart was dictated using voice recognition software.  Despite best efforts to proofread,  errors can occur which can  change the documentation meaning.   Nira Conn, MD 05/24/21 [phone]

## 2021-05-24 NOTE — Progress Notes (Signed)
Patient ID: John Vaughan, male   DOB: 21-Jun-1991, 30 y.o.   MRN: [id number]  Patient presents involuntarily secondary to dangerous behaviors toward self. Patient's mother reported that he has been threatening to shoot himself and was playing with a knife, reporting that he was going to kill himself. Presents with history of alcohol abuse and was intoxicated upon first encounter at [facility]. Per ED report, patient was reporting that he is experiencing withdrawals. Upon this assessment, patient continued to report increased anxiety, restlessness, tremors, body itching and aching. He was cooperative with assessment. And denying suicidal thoughts. Reports that his stressors include taking care of his disabled mother. He reports drinking about a 12 pack of beer almost everyday and has developed tolerance. He is motivated for treatment to develop coping skills. Patient not considering a long term treatment secondary to his family responsibilities (has to take care of his mother). Admission and discharge information provided. Patient was oriented to the unit and safety precautions initiated.

## 2021-05-24 NOTE — ED Notes (Signed)
Attempted to call report x1 to [facility]. 
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
## 2021-05-24 NOTE — ED Notes (Signed)
Pt currently talking to TTS 

## 2021-05-24 NOTE — Progress Notes (Signed)
Deputy left with patient enroute to [facility]. Per provider- MD Adela Lank made aware of same.

## 2021-05-24 NOTE — Progress Notes (Signed)
[facility] Group Notes:  (Nursing/MHT/Case Management/Adjunct)  Date:  05/24/2021  Time:  11:29 PM  Type of Therapy:  wrap up  Participation Level:  Active  Participation Quality:  Appropriate  Affect:  Appropriate  Cognitive:  Alert and Appropriate  Insight:  Appropriate and Good  Engagement in Group:  Engaged  Modes of Intervention:  Problem-solving  Summary of Progress/Problems: John Vaughan shared how his anxiety was high due to his withdrawals but he is hoping with time it gets better.   [name] 05/24/2021, 11:29 PM

## 2021-05-24 NOTE — Progress Notes (Addendum)
   05/24/21 2100  Psych Admission Type (Psych Patients Only)  Admission Status Involuntary  Psychosocial Assessment  Patient Complaints Anxiety;Substance abuse  Eye Contact Fair  Facial Expression Anxious  Affect Anxious  Speech Logical/coherent  Interaction Assertive  Motor Activity Restless  Appearance/Hygiene Disheveled;Body odor;Poor hygiene  Behavior Characteristics Cooperative  Mood Anxious  Thought Process  Coherency WDL  Content WDL  Delusions None reported or observed  Perception WDL  Hallucination None reported or observed  Judgment Poor  Confusion None  Danger to Self  Current suicidal ideation? Denies  Danger to Others  Danger to Others None reported or observed   Pt seen interacting in dayroom. Pt denies SI, HI, AVH and pain. Pt endorses anxiety 10/10 and the following withdrawal symptoms: sweating, tremors, anxiety, balance issues. Pt CIWA=10. Pt asked about Ativan as he says he was given this in the hospital. Pt told he had Librium available and to speak with provider in the morning for a change in detox protocol. Pt also endorses panic attack at idea of being in a locked facility. Pt states he was [name]'d once before in [facility].

## 2021-05-24 NOTE — Plan of Care (Signed)
Newly admitted. Currently in room resting. Cooperative and motivated for treatment.

## 2021-05-24 NOTE — ED Notes (Signed)
Patient also with back pain radiating into his chest, fever x 2 months, increased weakness in legs.

## 2021-05-24 NOTE — ED Notes (Signed)
Pt belongings in [place] 1- 2 bags
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
## 2021-05-24 NOTE — ED Provider Notes (Addendum)
[facility] Medical Screening Exam  Patient Name: John Vaughan MRN: [id number] Date of Evaluation: 05/24/21 Chief Complaint:   Diagnosis:  Final diagnoses:  MDD (major depressive disorder), recurrent severe, without psychosis (HCC)  Alcohol abuse  Suicidal ideation    History of Present illness: John Vaughan is a 30 y.o. male with history of alcohol abuse, alcohol withdrawal seizures, depression, and anxiety. Patient presented to [facility] voluntarily via Patent examiner. Law enforcement was called by patient's mother due to patient "holding a rifle to his head and communicating suicidal ideation." Patient is denying that he held a gun to his head and stated "my mom called the police because she was hallucinating." Patient is refusing to give consent for collateral information.   Patient denies suicidal ideation; he reports that his mother was hallucinating about him holding a gun to his head. He denies HI, AVH, paranoia, and no delusional thought content noted. He admits to daily alcohol consumption. He reports that he typically drinks about 12 cans of beer per day but has been cutting back. He reported drinking 12oz of wine today. He denies illegal substance use. He reports that he lives at home with his mother. He reports history of severe alcohol withdraw symptoms including seizure when he abstains from alcohol.    Patient was assessed in [facility] at [facility]. Patient is alert and oriented, patient is irritable and minimally cooperative. Patient's speech is clear and coherent, his mood is anxious and irritable, his affect is congruent with his mood. He is denying acute distress, SOB, chest pain, generalized pain, GI/GU symptoms, visual changes, diaphoresis, tremors, seizure/seizure-like activities, fever, headache, or chills.   Psychiatric Specialty Exam  Presentation  General Appearance:Disheveled  Eye Contact:Fair  Speech:Clear and Coherent  Speech  Volume:Normal  Handedness:Right   Mood and Affect  Mood:Irritable  Affect:Congruent   Thought Process  Thought Processes:Coherent  Descriptions of Associations:Intact  Orientation:Full (Time, Place and Person)  Thought Content:WDL    Hallucinations:None  Ideas of Reference:None  Suicidal Thoughts:No  Homicidal Thoughts:No   Sensorium  Memory:Immediate Fair; Recent Fair; Remote Fair  Judgment:Poor  Insight:Poor   Executive Functions  Concentration:Fair  Attention Span:Fair  Recall:Poor  Fund of Knowledge:Fair  Language:Fair   Psychomotor Activity  Psychomotor Activity:Normal   Assets  Assets:Housing; Social Support; Financial Resources/Insurance   Sleep  Sleep:Fair  Number of hours: No data recorded  No data recorded  Physical Exam: Physical Exam Constitutional:      General: He is not in acute distress. HENT:     Head: Normocephalic.  Eyes:     General:        Right eye: No discharge.        Left eye: No discharge.  Cardiovascular:     Rate and Rhythm: Tachycardia present.     Pulses: Normal pulses.  Pulmonary:     Effort: Pulmonary effort is normal. No respiratory distress.  Musculoskeletal:        General: Normal range of motion.     Cervical back: Normal range of motion.  Skin:    General: Skin is dry.  Neurological:     Mental Status: He is alert and oriented to person, place, and time.  Psychiatric:        Attention and Perception: Attention and perception normal. He does not perceive auditory or visual hallucinations.        Mood and Affect: Affect is angry.        Speech: Speech normal.  Behavior: Behavior is agitated.        Thought Content: Thought content normal. Thought content is not paranoid or delusional. Thought content does not include homicidal or suicidal ideation. Thought content does not include homicidal or suicidal plan.        Cognition and Memory: Cognition normal.        Judgment: Judgment  normal.    Review of Systems  Constitutional: Negative.  Negative for chills and fever.  HENT: Negative.   Respiratory: Negative.   Cardiovascular: Negative.  Negative for chest pain and palpitations.  Gastrointestinal: Negative.   Genitourinary: Negative.   Musculoskeletal: Negative.   Skin: Negative.   Neurological: Negative.   Endo/Heme/Allergies: Negative.   Psychiatric/Behavioral: Positive for substance abuse. Negative for hallucinations and suicidal ideas. The patient is not nervous/anxious.    Blood pressure 114/81, pulse (!) 102, temperature 98.7 F (37.1 C), temperature source Oral, resp. rate 18, SpO2 97 %. There is no height or weight on file to calculate BMI.  Musculoskeletal: Strength & Muscle Tone: within normal limits Gait & Station: normal Patient leans: Right   [facility] MSE Discharge Disposition for Follow up and Recommendations: Based on my evaluation the patient appears to have an emergency medical condition for which I recommend the patient be transferred to the emergency department for further evaluation.   -Patient was brought to [facility] for suicidal ideation and pointing gun to his head, pt recommended for overnight observation. Patient will need to be transferred to the ED for medical clearance and close monitoring due to history of alcohol withdrawal seizures and delirium tremens. Patient was accepted to go to [facility] by Dr. Adela Lank.  -Patient was [name]'ed at [facility] due to safety concerns. Law enforcement called to patient's home by his mom due to patient "holding a gun to his head and communicating suicidal ideation." patient admits to having guns in the home and patient refused to give consent to obtain collateral information. Patient will need to be observed overnight pending collateral information. Patient will be sent to [facility] for medical clearance with reassessment in the morning by psychiatry.      Maricela Bo, NP 05/24/2021, 2:16 AM

## 2021-05-24 NOTE — Progress Notes (Signed)
[name] paperwork being obtained from the magistrate for this patient due to report of him "holding a gun to his head" earlier in the evening.  Pt asked for and received a sandwich and some ice water.  Deputy Surrant standing by with patient.

## 2021-05-24 NOTE — Progress Notes (Signed)
Pt accepted to [facility] rm 407-1  Patient meets inpatient criteria per Assunta Found, NP.   Dr. Jola Babinski is the attending provider.    Call report to [phone]    Denton Ar, RN @ [facility] ED notified.     Signed:  Corky Crafts, MSW, [place], LCASA 05/24/2021 2:26 PM

## 2021-05-24 NOTE — ED Notes (Signed)
Belongings obtained and copies of appropriate paperwork gathered and placed in envelope to go w/ [employer] when pt transported to [facility]

## 2021-05-24 NOTE — ED Notes (Signed)
Pt's breakfast tray has arrived. Pt awoken by Clinical research associate, sat up and eating their breakfast. Will continue to monitor pt.

## 2021-05-24 NOTE — Tx Team (Signed)
Initial Treatment Plan 05/24/2021 3:55 PM [name] [id number]    PATIENT STRESSORS: Financial difficulties Medication change or noncompliance Substance abuse   PATIENT STRENGTHS: Ability for insight Communication skills Motivation for treatment/growth Physical Health Supportive family/friends   PATIENT IDENTIFIED PROBLEMS: Substance useAnxiety  Impulsivity                   DISCHARGE CRITERIA:  Improved stabilization in mood, thinking, and/or behavior Motivation to continue treatment in a less acute level of care Reduction of life-threatening or endangering symptoms to within safe limits Verbal commitment to aftercare and medication compliance  PRELIMINARY DISCHARGE PLAN: Outpatient therapy Return to previous living arrangement  PATIENT/FAMILY INVOLVEMENT: This treatment plan has been presented to and reviewed with the patient, John Vaughan. The patient has been given the opportunity to ask questions and make suggestions.  Olin Pia, RN 05/24/2021, 3:55 PM

## 2021-05-24 NOTE — ED Notes (Signed)
Pt's lunch tray has arrived. Pt awoken from nap but did not want to eat lunch at the moment. Pt's lunch on bedside table, will continue to monitor pt

## 2021-05-24 NOTE — BH Assessment (Signed)
Comprehensive Clinical Assessment (CCA) Note  05/24/2021 John Vaughan [id number]  Recommendations for Services/Supports/Treatments: John Reasoner, NP, reviewed pt's chart and information and met with pt and determined pt presents a danger to himself, so clinician IVCed pt. Pt was transferred to [facility] for medical clearance due to pt's hx of having seizures while detoxing. NP determined pt meets inpatient criteria at this time.  The patient demonstrates the following risk factors for suicide: Chronic risk factors for suicide include: psychiatric disorder of Alcohol-induced depressive disorder, substance use disorder, previous suicide attempts , as pt held a rifle to his head this evening, medical illness chirrosis of the liver and demographic factors (male, >34 y/o). Acute risk factors for suicide include: unemployment, social withdrawal/isolation and loss (financial, interpersonal, professional). Protective factors for this patient include: responsibility to others (children, family). Considering these factors, the overall suicide risk at this point appears to be high. Patient is not appropriate for outpatient follow up.  Therefore, a 1:1 sitter is recommended for suicide precautions.  [place] [facility] from 05/24/2021 in [place] [facility] to Hosp-Admission (Discharged) from 03/19/2021 in [facility]/STEPDOWN ED from 09/05/2020 in [place] DEPT  C-SSRS RISK CATEGORY High Risk Error: Question 6 not populated Low Risk     Chief Complaint:  Chief Complaint  Patient presents with  . Suicidal  . Chest Pain  . Back Pain   Visit Diagnosis: F10.24, Alcohol-induced depressive disorder, With severe use disorder  CCA Screening, Triage and Referral (STR) John Vaughan is a 30 year old patient who was brought to the [facility]) by the sheriff's department due to his mother calling 911  b/c pt had been destructive in the home and was holding a rifle to his head. Pt refused to corroborate as to whether this information was true or not, stating only that he did not ruin the guitar he was smashing into the floor.  Pt states his father died 11-19-2020 and that, since that time, he's been "completely depressed." Pt states he engaged in NSSIB via cutting several months ago and states, "I can't talk to anybody. Three weeks ago I got a DUI - it was driving while impaired on medicine - Ativan, Librium, and Clozapine - that [facility] prescribed." Pt states he has not taken these prescriptions since that day.  Pt denies SI, HI, or AVH; he refuses to discuss his mother's concerns and refuses to provide clinician with verbal consent to contact his mother, or anyone else, for collateral information.  Pt appears to be intoxicated at this time, as evidenced by the smell of EtOH coming off of pt, his inability to remain focused, and his inability to remain sitting. While in the [facility], pt threatened to "tear things up" and put his hand on the fire alarm as if threatening to pull it.  Pt's orientation and memory were UTA. Pt was uncooperative throughout the assessment process. Pt's insight, judgement, and impulse control is poor at this time. Pt was IVCed and transferred to [facility] due to pt's potential harm to self and his hx of seizures while detoxing from the use of EtOH.  Patient Reported Information How did you hear about [place]? Legal System  Referral name: [facility]  Referral phone number: 0 (N/A)   Whom do you see for routine medical problems? Other (Comment) (UTA)  Practice/Facility Name: No data recorded Practice/Facility Phone Number: No data recorded Name of Contact: No data recorded Contact  Number: No data recorded Contact Fax Number: No data recorded Prescriber Name: No data recorded Prescriber Address (if known): No data recorded  What Is the Reason for Your  Visit/Call Today? Pt would not provide this information. The sheriff's depty that brought pt to the [facility] states pt's mother called 21, stating pt was damaging items in the home and that he was holding a rifle to his head. This information was verified to the sheriff's depty by pt's mother when he arrived to pt's home where he lives with his mother. Pt declined to provide verbal consent for clinician to make contact with his mother for collateral information.  How Long Has This Been Causing You Problems? > than 6 months  What Do You Feel Would Help You the Most Today? -- (Pt wants to return to his home.)   Have You Recently Been in Any Inpatient Treatment (Hospital/Detox/Crisis Center/28-Day Program)? No  Name/Location of Program/Hospital:No data recorded How Long Were You There? No data recorded When Were You Discharged? No data recorded  Have You Ever Received Services From [facility] Before? Yes  Who Do You See at [facility]? Pt has seen various providers in the ED.   Have You Recently Had Any Thoughts About Hurting Yourself? -- (Pt denies, though pt's mother reports pt held a rifle to his head this evening.)  Are You Planning to Commit Suicide/Harm Yourself At This time? -- (Pt denies, though pt's mother reports pt held a rifle to his head this evening.)   Have you Recently Had Thoughts About [place]? No  Explanation: No data recorded  Have You Used Any Alcohol or Drugs in the Past 24 Hours? Yes  How Long Ago Did You Use Drugs or Alcohol? No data recorded What Did You Use and How Much? Pt states he consumed EtOH, though he was unwilling to say how much.   Do You Currently Have a Therapist/Psychiatrist? No  Name of Therapist/Psychiatrist: No data recorded  Have You Been Recently Discharged From Any Office Practice or Programs? No  Explanation of Discharge From Practice/Program: No data recorded    CCA Screening Triage Referral Assessment Type of Contact:  Face-to-Face  Is this Initial or Reassessment? No data recorded Date Telepsych consult ordered in [facility]:  08/18/2020  Time Telepsych consult ordered in [facility]:  [place]   Patient Reported Information Reviewed? Yes  Patient Left Without Being Seen? No data recorded Reason for Not Completing Assessment: No data recorded  Collateral Involvement: Pt declined to provide verbal consent for clinician to make contact with any friends/family members for collateral information.   Does Patient Have a Stage manager Guardian? No data recorded Name and Contact of Legal Guardian: Self.   If Minor and Not Living with Parent(s), Who has Custody? N/A  Is CPS involved or ever been involved? Never  Is APS involved or ever been involved? Never   Patient Determined To Be At Risk for Harm To Self or Others Based on Review of Patient Reported Information or Presenting Complaint? Yes, for Self-Harm  Method: No data recorded Availability of Means: No data recorded Intent: No data recorded Notification Required: No data recorded Additional Information for Danger to Others Potential: No data recorded Additional Comments for Danger to Others Potential: No data recorded Are There Guns or Other Weapons in Your Home? No data recorded Types of Guns/Weapons: No data recorded Are These Weapons Safely Secured?  No data recorded Who Could Verify You Are Able To Have These Secured: No data recorded Do You Have any Outstanding Charges, Pending Court Dates, Parole/Probation? No data recorded Contacted To Inform of Risk of Harm To Self or Others: Event organiser; Family/Significant Other: (LEO and pt's mother is aware of pt's threat to self)   Location of Assessment: [facility]   Does Patient Present under Involuntary Commitment? -- (Pt arrived at the [facility] voluntarily but was [facility] by clinician due to concerns for safety to self.)  [name] Papers Initial File Date: No data  recorded  [place] of Residence: [place]   Patient Currently Receiving the Following Services: Not Receiving Services   Determination of Need: Emergent (2 hours)   Options For Referral: ED Referral; Inpatient Hospitalization; Outpatient Therapy; Medication Management     CCA Biopsychosocial Intake/Chief Complaint:  Pt would not provide this information. The sheriff's depty that brought pt to the [facility] states pt's mother called 46, stating pt was damaging items in the home and that he was holding a rifle to his head. This information was verified to the sheriff's depty by pt's mother when he arrived to pt's home where he lives with his mother. Pt declined to provide verbal consent for clinician to make contact with his mother for collateral information.  Current Symptoms/Problems: Pt states his father passed away on 2020/11/13 and that, since that time, he's been "completely depressed."   Patient Reported Schizophrenia/Schizoaffective Diagnosis in Past: -- (Unknown)   Strengths: Pt is able to identify that he has difficulties sharing his feelings. Pt lives with his mother and has stable housing.  Preferences: Pt would like to be d/c home.  Abilities: Not assessed   Type of Services Patient Feels are Needed: Pt denies the need for services and is requesting to be d/c home.   Initial Clinical Notes/Concerns: Pt was unwilling to provide much information or to provide consent for clinician to make contact with friends/family members.   Mental Health Symptoms Depression:  Change in energy/activity; Difficulty Concentrating; Fatigue; Sleep (too much or little); Irritability; Increase/decrease in appetite   Duration of Depressive symptoms: Greater than two weeks   Mania:  None   Anxiety:   Worrying; Tension; Difficulty concentrating   Psychosis:  None   Duration of Psychotic symptoms: No data recorded  Trauma:  Detachment from others   Obsessions:  None    Compulsions:  None   Inattention:  None   Hyperactivity/Impulsivity:  N/A   Oppositional/Defiant Behaviors:  Angry; Argumentative; Temper   Emotional Irregularity:  Mood lability; Potentially harmful impulsivity; Intense/inappropriate anger   Other Mood/Personality Symptoms:  None noted    Mental Status Exam Appearance and self-care  Stature:  Average   Weight:  Average weight   Clothing:  Dirty; Careless/inappropriate; Disheveled   Grooming:  Neglected   Cosmetic use:  None   Posture/gait:  Slumped   Motor activity:  Agitated; Restless   Sensorium  Attention:  Inattentive; Distractible   Concentration:  Scattered; Anxiety interferes   Orientation:  -- (UTA)   Recall/memory:  Normal   Affect and Mood  Affect:  Constricted; Blunted; Negative   Mood:  Anxious; Irritable   Relating  Eye contact:  Fleeting   Facial expression:  Depressed; Angry; Anxious   Attitude toward examiner:  Defensive; Guarded; Irritable   Thought and Language  Speech flow: Blocked   Thought content:  Suspicious   Preoccupation:  Suicide   Hallucinations:  None   Organization:  No data recorded  [employer] of Knowledge:  Average   Intelligence:  Average   Abstraction:  -- (UTA)   Judgement:  Impaired   Reality Testing:  -- (UTA)   Insight:  Lacking   Decision Making:  Impulsive   Social Functioning  Social Maturity:  Impulsive; Irresponsible   Social Judgement:  "Street Smart"   Stress  Stressors:  Family conflict; Grief/losses; Illness; Legal   Coping Ability:  Overwhelmed; Deficient supports   Skill Deficits:  Communication; Decision making; Self-care; Self-control   Supports:  Family     Religion: Religion/Spirituality Are You A Religious Person?:  (Not assessed) How Might This Affect Treatment?: Not assessed  Leisure/Recreation: Leisure / Recreation Do You Have Hobbies?:  (Not assessed)  Exercise/Diet: Exercise/Diet Do You  Exercise?:  (Not assessed) Have You Gained or Lost A Significant Amount of Weight in the Past Six Months?:  (Not assessed) Do You Follow a Special Diet?:  (Not assessed) Do You Have Any Trouble Sleeping?: Yes Explanation of Sleeping Difficulties: Pt states he hasn't slept in 4 days   CCA Employment/Education Employment/Work Situation: Employment / Work Situation Employment situation: Unemployed Patient's job has been impacted by current illness:  (N/A) What is the longest time patient has a held a job?: Not assessed Where was the patient employed at that time?: Not assessed Has patient ever been in the [employer]?: No  Education: Education Is Patient Currently Attending School?: No Last Grade Completed: 12 Name of [employer] School: [employer] [place]. Did You Graduate From [employer]?: Yes Did You Attend College?: No Did [place]?: No Did You Have Any Special Interests In School?: Not assessed Did You Have An Individualized Education Program (IIEP):  (Not assessed) Did You Have Any Difficulty At School?:  (Not assessed) Patient's Education Has Been Impacted by Current Illness:  (Not assessed)   CCA Family/Childhood History Family and Relationship History: Family history Marital status: Single Are you sexually active?:  (Not assessed) What is your sexual orientation?: Not assessed Has your sexual activity been affected by drugs, alcohol, medication, or emotional stress?: Not assessed Does patient have children?: Yes How many children?: 1 How is patient's relationship with their children?: Pt states he hasn't seen his 17 1/2 year old son in 2 years  Childhood History:  Childhood History By whom was/is the patient raised?: Both parents Additional childhood history information: Not assessed Description of patient's relationship with caregiver when they were a child: Not assessed Patient's description of current relationship with people who raised  him/her: Pt's father died in 11-04-2020 and he states he's been "completely depressed" since that time. How were you disciplined when you got in trouble as a child/adolescent?: Not assessed Does patient have siblings?:  (Not assessed) Did patient suffer any verbal/emotional/physical/sexual abuse as a child?:  (Not assessed) Did patient suffer from severe childhood neglect?:  (Not assessed) Has patient ever been sexually abused/assaulted/raped as an adolescent or adult?:  (Not assessed) Was the patient ever a victim of a crime or a disaster?:  (Not assessed) Witnessed domestic violence?:  (Not assessed) Has patient been affected by domestic violence as an adult?:  (Not assessed)  Child/Adolescent Assessment:     CCA Substance Use Alcohol/Drug Use: Alcohol / Drug Use Pain Medications: See MAR Prescriptions: See MAR Over the Counter: See MAR History of alcohol / drug use?: Yes Longest period of sobriety (when/how long): UTA 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 Use: Legal,Personal relationships Withdrawal Symptoms: Nausea / Vomiting,Seizures,Sweats,Tingling Onset  of Seizures: UTA Date of most recent seizure: UTA Substance #1 Name of Substance 1: EtOH 1 - Age of First Use: UTA 1 - Amount (size/oz): UTA 1 - Frequency: UTA 1 - Duration: UTA 1 - Last Use / Amount: 05/23/2021 1 - Method of Aquiring: Purchase 1- Route of Use: Oral                       ASAM's:  Six Dimensions of Multidimensional Assessment  Dimension 1:  Acute Intoxication and/or Withdrawal Potential:   Dimension 1:  Description of individual's past and current experiences of substance use and withdrawal: Pt has experienced seizures in the past  Dimension 2:  Biomedical Conditions and Complications:   Dimension 2:  Description of patient's biomedical conditions and  complications: Pt has been dx with chirrosis of the liver  Dimension 3:  Emotional, Behavioral, or Cognitive Conditions and Complications:  Dimension  3:  Description of emotional, behavioral, or cognitive conditions and complications: Pt has depression that he is not being treated for  Dimension 4:  Readiness to Change:  Dimension 4:  Description of Readiness to Change criteria: Pt has no desire to stop drinking  Dimension 5:  Relapse, Continued use, or Continued Problem Potential:  Dimension 5:  Relapse, continued use, or continued problem potential critiera description: Pt has no desire to stop drinking  Dimension 6:  Recovery/Living Environment:  Dimension 6:  Recovery/Iiving environment criteria description: Per LEO, pt's mother (and deceased father) also abuse/abused substances  ASAM Severity Score: ASAM's Severity Rating Score: 15  ASAM Recommended Level of Treatment: ASAM Recommended Level of Treatment: Level III Residential Treatment   Substance use Disorder (SUD) Substance Use Disorder (SUD)  Checklist Symptoms of Substance Use: Continued use despite having a persistent/recurrent physical/psychological problem caused/exacerbated by use,Evidence of tolerance,Evidence of withdrawal (Comment),Persistent desire or unsuccessful efforts to cut down or control use,Recurrent use that results in a failure to fulfill major role obligations (work, school, home),Substance(s) often taken in larger amounts or over longer times than was intended  Recommendations for Services/Supports/Treatments: Recommendations for Services/Supports/Treatments Recommendations For Services/Supports/Treatments: Individual Therapy,Medication Management,Inpatient Hospitalization  John Reasoner, NP, reviewed pt's chart and information and met with pt and determined pt presents a danger to himself, so clinician IVCed pt. Pt was transferred to [facility] for medical clearance due to pt's hx of having seizures while detoxing. NP determined pt meets inpatient criteria at this time.  DSM5 Diagnoses: Patient Active Problem List   Diagnosis Date Noted  . Depression 03/20/2021  .  Alcohol withdrawal ([place]) 09/06/2020  . 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 ([place]) 04/20/2020  . Homicidal ideation   . GAD (generalized anxiety disorder) 03/09/2019  . Alcohol use disorder, severe, dependence ([place]) 03/09/2019  . 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

## 2021-05-24 NOTE — ED Notes (Signed)
All belongings and appropriate paperwork given to [facility] deputy.

## 2021-05-25 DIAGNOSIS — F1994 Other psychoactive substance use, unspecified with psychoactive substance-induced mood disorder: Secondary | ICD-10-CM | POA: Diagnosis not present

## 2021-05-25 DIAGNOSIS — F1024 Alcohol dependence with alcohol-induced mood disorder: Secondary | ICD-10-CM

## 2021-05-25 DIAGNOSIS — F1420 Cocaine dependence, uncomplicated: Secondary | ICD-10-CM | POA: Diagnosis not present

## 2021-05-25 LAB — TSH: TSH: 1.685 u[IU]/mL (ref 0.350–4.500)

## 2021-05-25 MED ORDER — CARBAMAZEPINE 100 MG PO CHEW
100.0000 mg | CHEWABLE_TABLET | Freq: Three times a day (TID) | ORAL | Status: DC
  Administered 2021-05-25 – 2021-05-30 (×16): 100 mg via ORAL
  Filled 2021-05-25 (×2): qty 1
  Filled 2021-05-25: qty 21
  Filled 2021-05-25: qty 1
  Filled 2021-05-25 (×3): qty 21
  Filled 2021-05-25 (×2): qty 1
  Filled 2021-05-25: qty 21
  Filled 2021-05-25 (×5): qty 1
  Filled 2021-05-25: qty 21
  Filled 2021-05-25: qty 1
  Filled 2021-05-25 (×2): qty 21
  Filled 2021-05-25 (×3): qty 1
  Filled 2021-05-25: qty 21
  Filled 2021-05-25: qty 1

## 2021-05-25 MED ORDER — ENSURE ENLIVE PO LIQD
237.0000 mL | Freq: Two times a day (BID) | ORAL | Status: DC
  Administered 2021-05-28 – 2021-05-29 (×3): 237 mL via ORAL
  Filled 2021-05-25 (×15): qty 237

## 2021-05-25 NOTE — Progress Notes (Signed)
Adult Psychoeducational Group Note  Date:  05/25/2021 Time:  11:19 AM  Group Topic/Focus:  Goals Group:   The focus of this group is to help patients establish daily goals to achieve during treatment and discuss how the patient can incorporate goal setting into their daily lives to aide in recovery.  Participation Level:  Active  Participation Quality:  Appropriate  Affect:  Appropriate  Cognitive:  Appropriate  Insight: Appropriate  Engagement in Group:  Engaged  Modes of Intervention:  Discussion  Additional Comments:   Patient attended Goal's group and participated.    Latangela Mccomas W Pavle Wiler 05/25/2021, 11:19 AM

## 2021-05-25 NOTE — Progress Notes (Signed)
Adult Psychoeducational Group Note  Date:  05/25/2021 Time:  10:47 AM  Group Topic/Focus:  Wrap-Up Group:   The focus of this group is to help patients review their daily goal of treatment and discuss progress on daily workbooks.  Participation Level:  Active  Participation Quality:  Appropriate  Affect:  Appropriate  Cognitive:  Appropriate  Insight: Appropriate  Engagement in Group:  Engaged  Modes of Intervention:  Discussion  Additional Comments: Patient attended Nutrition group and participated.    Sheketa Ende W Liridona Mashaw 05/25/2021, 10:47 AM

## 2021-05-25 NOTE — H&P (Signed)
Psychiatric Admission Assessment Adult  Patient Identification: John Vaughan MRN:  [id number] Date of Evaluation:  05/25/2021 Chief Complaint:  Depression [F32.A] Principal Diagnosis: <principal problem not specified> Diagnosis:  Active Problems:   Depression  History of Present Illness: Patient is seen and examined.  Patient is a 30 year old male with a past psychiatric history significant for alcohol dependence, cocaine use disorder, history of alcohol withdrawal related seizures who originally presented to the [facility] emergency department on 05/23/2021.  He was evaluated by the comprehensive clinical assessment team.  He had been brought to the behavioral health urgent care center by the sheriff's department due to his mother calling 911 because the patient had been destructive in his home, was holding a rifle to his head, and also attempted to cut himself.  The patient stated that he had been depressed since approximately Dec 06, 2020.  He stated his father died for unknown cardiac reasons at that time.  The patient admitted that he had been cutting himself recently, and felt as though he was unable to talk to anyone.  He also got a DUI approximately 3 weeks prior to medicines.  He was taking lorazepam, Librium as well as clozapine.  That had been prescribed by [facility].  He denied suicidal or homicidal ideation.  There is no evidence of psychosis at that time.  He was transferred to the emergency department under involuntary commitment.  He was seen by psychiatric consultation on 05/24/2021.  At that time he stated that "I told my mom and to call the police".  He told me this morning that his mother had called the police because she was concerned for possible suicidality.  He stated that he was trying to "clean his gun".  The patient admitted to a longstanding history of alcoholism.  He stated he drank between 12-24 beers a day.  He admitted to a  longstanding history of alcohol dependence.  He did admit to the consult service that he felt as though his life was hitting a dead end.  He denied any previous suicide attempts or self-harm he denied any interest in going to a rehabilitation facility.  He was transferred to our facility for evaluation and stabilization.  Review of the electronic medical record revealed several hospitalizations and ER visits secondary to alcohol withdrawal syndromes.  He was admitted to the [facility] on 03/20/2021 secondary to alcohol withdrawal.  At that time he had been drinking between 24-30 beers a day, but had been cutting back and was only having 1-2 beers a day recently.  He began feeling weak and developing a fever.  He had mild diarrhea as well.  He was admitted for medical detoxification.  His discharge medications included Librium for taper as well as clonazepam 0.5 mg p.o. twice daily as needed.  He was also taking gabapentin as well as pantoprazole.  He had been prescribed sertraline 50 mg p.o. daily for depression.  His last psychiatric hospitalization was in December 2021.  He was admitted to the [facility] in [place].  He admitted at that time that he had had alcohol use disorder problems since age 10.  He stated he had been sober for approximately 2 months but then relapsed.  He reported a history of withdrawal seizures as well as delirium tremens.  He had been medically admitted between 12/11-12/15 for alcohol withdrawal and a blood alcohol at that time that was 418.  He was admitted for  detoxification at that time.  He was on the Librium taper and that was completed.  His discharge medications at that time included sertraline 100 mg p.o. daily, vitamin B12, gabapentin, multivitamin, Protonix.  He did not follow-up psychiatrically.  He presented on 04/12/2021 to an outpatient clinic.  He was prescribed oral naltrexone.  His blood alcohol on admission was 279.  Drug  screen was positive for cocaine as well.  Associated Signs/Symptoms: Depression Symptoms:  depressed mood, anhedonia, psychomotor retardation, fatigue, feelings of worthlessness/guilt, difficulty concentrating, hopelessness, suicidal thoughts without plan, anxiety, loss of energy/fatigue, disturbed sleep, Duration of Depression Symptoms: Greater than two weeks  (Hypo) Manic Symptoms:  Impulsivity, Irritable Mood, Labiality of Mood, Anxiety Symptoms:  Excessive Worry, Psychotic Symptoms:  Denied PTSD Symptoms: Had a traumatic exposure:  Recent death of father in May 15, 2020 Total Time spent with patient: 30 minutes  Past Psychiatric History: From review of the electronic medical record the patient has a longstanding history of alcohol dependence since his adolescence.  He has had several medical hospitalizations secondary to alcohol related issues.  It looks like he has had 1 previous psychiatric admission to the [facility] for alcohol related issues.  Is the patient at risk to self? Yes.    Has the patient been a risk to self in the past 6 months? Yes.    Has the patient been a risk to self within the distant past? Yes.    Is the patient a risk to others? No.  Has the patient been a risk to others in the past 6 months? No.  Has the patient been a risk to others within the distant past? No.   Prior Inpatient Therapy:   Prior Outpatient Therapy:    Alcohol Screening: 1. How often do you have a drink containing alcohol?: 4 or more times a week 2. How many drinks containing alcohol do you have on a typical day when you are drinking?: 10 or more 3. How often do you have six or more drinks on one occasion?: Daily or almost daily AUDIT-C Score: 12 4. How often during the last year have you found that you were not able to stop drinking once you had started?: Daily or almost daily 5. How often during the last year have you failed to do what was normally expected  from you because of drinking?: Less than monthly 6. How often during the last year have you needed a first drink in the morning to get yourself going after a heavy drinking session?: Daily or almost daily 7. How often during the last year have you had a feeling of guilt of remorse after drinking?: Less than monthly 8. How often during the last year have you been unable to remember what happened the night before because you had been drinking?: Less than monthly 9. Have you or someone else been injured as a result of your drinking?: No 10. Has a relative or friend or a doctor or another health worker been concerned about your drinking or suggested you cut down?: No Alcohol Use Disorder Identification Test Final Score (AUDIT): 23 Alcohol Brief Interventions/Follow-up: Alcohol education/Brief advice Substance Abuse History in the last 12 months:  Yes.   Consequences of Substance Abuse: Medical Consequences:  Clearly intoxication and alcohol issues led to influence this admission. Blackouts:  Patient admitted to previous blackouts from alcohol usage. DT's: Patient reported hallucinations and withdrawal syndromes from alcohol as well as seizures. Withdrawal Symptoms:   Headaches Nausea Tremors Vomiting Previous  Psychotropic Medications: Yes  Psychological Evaluations: Yes  Past Medical History:  Past Medical History:  Diagnosis Date  . Alcohol abuse   . Anxiety   . Depression    History reviewed. No pertinent surgical history. Family History:  Family History  Problem Relation Age of Onset  . Anxiety disorder Mother   . Alcohol abuse Father   . Anxiety disorder Maternal Aunt   . Anxiety disorder Maternal Grandmother   . Alcohol abuse Paternal Grandfather    Family Psychiatric  History: Patient stated that several members of his family have alcohol related issues, and that several family members have a history of depression as well. Tobacco Screening: Have you used any form of tobacco in  the last 30 days? (Cigarettes, Smokeless Tobacco, Cigars, and/or Pipes): Yes Tobacco use, Select all that apply: 5 or more cigarettes per day Are you interested in Tobacco Cessation Medications?: No, patient refused Counseled patient on smoking cessation including recognizing danger situations, developing coping skills and basic information about quitting provided: Refused/Declined practical counseling Social History:  Social History   Substance and Sexual Activity  Alcohol Use Yes  . Alcohol/week: 18.0 standard drinks  . Types: 18 Cans of beer per week   Comment: states that he drinks 2-4 beers daily     Social History   Substance and Sexual Activity  Drug Use Not Currently  . Types: Marijuana, Cocaine    Additional Social History: Marital status: Single Are you sexually active?: No What is your sexual orientation?: "Girls" Has your sexual activity been affected by drugs, alcohol, medication, or emotional stress?: "Yes" Lack of desire. Does patient have children?: Yes How many children?: 1 How is patient's relationship with their children?: Pt states he hasn't seen his 15 1/2 year old son in 2 years                         Allergies:  No Known Allergies Lab Results:  Results for orders placed or performed during the hospital encounter of 05/24/21 (from the past 48 hour(s))  TSH     Status: None   Collection Time: 05/25/21  6:24 AM  Result Value Ref Range   TSH 1.685 0.350 - 4.500 uIU/mL    Comment: Performed by a 3rd Generation assay with a functional sensitivity of <=0.01 uIU/mL. Performed at [facility], [address] [place], [place] [address]     Blood Alcohol level:  Lab Results  Component Value Date   ETH 279 (H) 05/24/2021   ETH 148 (H) 09/05/2020    Metabolic Disorder Labs:  No results found for: HGBA1C, MPG No results found for: PROLACTIN No results found for: CHOL, TRIG, HDL, CHOLHDL, VLDL, LDLCALC  Current  Medications: Current Facility-Administered Medications  Medication Dose Route Frequency Provider Last Rate Last Admin  . acetaminophen (TYLENOL) tablet 650 mg  650 mg Oral Q6H PRN Antonieta Pert, MD      . alum & mag hydroxide-simeth (MAALOX/MYLANTA) 200-200-20 MG/5ML suspension 30 mL  30 mL Oral Q4H PRN Antonieta Pert, MD      . carbamazepine (TEGRETOL) chewable tablet 100 mg  100 mg Oral TID Antonieta Pert, MD   100 mg at 05/25/21 1247  . chlordiazePOXIDE (LIBRIUM) capsule 25 mg  25 mg Oral QID PRN Antonieta Pert, MD   25 mg at 05/25/21 1252  . feeding supplement (ENSURE ENLIVE / ENSURE PLUS) liquid 237 mL  237 mL Oral BID BM Shanora Christensen,  Marlane Mingle, MD      . folic acid (FOLVITE) tablet 1 mg  1 mg Oral Daily Antonieta Pert, MD   1 mg at 05/25/21 1243  . gabapentin (NEURONTIN) capsule 300 mg  300 mg Oral TID Antonieta Pert, MD   300 mg at 05/25/21 1243  . hydrOXYzine (ATARAX/VISTARIL) tablet 25 mg  25 mg Oral TID PRN Antonieta Pert, MD   25 mg at 05/25/21 1252  . magnesium hydroxide (MILK OF MAGNESIA) suspension 30 mL  30 mL Oral Daily PRN Antonieta Pert, MD      . pantoprazole (PROTONIX) EC tablet 40 mg  40 mg Oral Daily Antonieta Pert, MD   40 mg at 05/25/21 1243  . sertraline (ZOLOFT) tablet 25 mg  25 mg Oral Daily Antonieta Pert, MD   25 mg at 05/25/21 1244  . thiamine tablet 100 mg  100 mg Oral Daily Antonieta Pert, MD   100 mg at 05/25/21 1243  . traZODone (DESYREL) tablet 50 mg  50 mg Oral QHS PRN Antonieta Pert, MD   50 mg at 05/24/21 2140   PTA Medications: Medications Prior to Admission  Medication Sig Dispense Refill Last Dose  . chlordiazePOXIDE (LIBRIUM) 25 MG capsule Take 25 mg by mouth 3 (three) times daily as needed for anxiety. (Patient not taking: Reported on 05/24/2021)     . clonazePAM (KLONOPIN) 0.5 MG tablet Take 1 tablet (0.5 mg total) by mouth 2 (two) times daily as needed for anxiety. 15 tablet 0   . gabapentin (NEURONTIN) 300  MG capsule Take 900 mg by mouth 3 (three) times daily.     . hydrOXYzine (ATARAX/VISTARIL) 25 MG tablet Take 1 tablet (25 mg total) by mouth 3 (three) times daily as needed. (Patient not taking: Reported on 05/24/2021) 60 tablet 1   . Multiple Vitamins-Minerals (MULTIVITAMIN ADULTS PO) Take 1 tablet by mouth daily.     . naltrexone (DEPADE) 50 MG tablet Take 1 tablet (50 mg total) by mouth daily. (Patient not taking: Reported on 05/24/2021) 30 tablet 3   . ondansetron (ZOFRAN) 4 MG tablet Take 1 tablet (4 mg total) by mouth every 6 (six) hours. (Patient taking differently: Take 4 mg by mouth every 8 (eight) hours as needed for nausea or vomiting.) 12 tablet 0   . pantoprazole (PROTONIX) 40 MG tablet Take 40 mg by mouth daily as needed (heartburn).     . sertraline (ZOLOFT) 50 MG tablet Take 1 tablet (50 mg total) by mouth daily. 30 tablet 3     Musculoskeletal: Strength & Muscle Tone: within normal limits Gait & Station: normal Patient leans: N/A            Psychiatric Specialty Exam:  Presentation  General Appearance: Disheveled  Eye Contact:Fair  Speech:Normal Rate  Speech Volume:Normal  Handedness:Right   Mood and Affect  Mood:Dysphoric  Affect:Congruent   Thought Process  Thought Processes:Coherent  Duration of Psychotic Symptoms: No data recorded Past Diagnosis of Schizophrenia or Psychoactive disorder: No  Descriptions of Associations:Circumstantial  Orientation:Full (Time, Place and Person)  Thought Content:Rumination  Hallucinations:Hallucinations: None  Ideas of Reference:None  Suicidal Thoughts:Suicidal Thoughts: No SI Active Intent and/or Plan: Without Intent; With Plan (Patient reported he was playing dangerously with knives, and cleaning his gun "It was pointing at me but I was just cleaning it")  Homicidal Thoughts:Homicidal Thoughts: No   Sensorium  Memory:Immediate Fair; Recent Fair; Remote  Fair  Judgment:Impaired  Insight:Lacking   Executive  Functions  Concentration:Fair  Attention Span:Fair  Recall:Poor  Fund of Knowledge:Fair  Language:Fair   Psychomotor Activity  Psychomotor Activity:Psychomotor Activity: Normal   Assets  Assets:Desire for Improvement; Resilience; Housing   Sleep  Sleep:Sleep: Good Number of Hours of Sleep: 6.75    Physical Exam: Physical Exam Vitals and nursing note reviewed.  HENT:     Head: Normocephalic and atraumatic.  Pulmonary:     Effort: Pulmonary effort is normal.  Neurological:     General: No focal deficit present.     Mental Status: He is alert and oriented to person, place, and time.    Review of Systems  All other systems reviewed and are negative.  Blood pressure 123/88, pulse 88, temperature 97.6 F (36.4 C), resp. rate 20, height 6' (1.829 m), weight 76.7 kg, SpO2 96 %. Body mass index is 22.92 kg/m.  Treatment Plan Summary: Daily contact with patient to assess and evaluate symptoms and progress in treatment, Medication management and Plan : Patient is seen and examined.  Patient is a 30 year old male with the above-stated past psychiatric history who was admitted secondary to concern for suicidal ideation as well as alcohol dependence.  He will be admitted to the hospital.  He will be integrated in the milieu.  He will be encouraged to attend groups.  Throughout the interview today he was very med seeking.  He was looking for clonazepam or other benzodiazepines.  I explained to the patient that he would be on a withdrawal protocol, and that would be giving benzodiazepines for certain parameters for withdrawal.  He continued to request clonazepam, and stated "it helps me not drink".  I informed him that I would not be able to prescribe those on a regular basis mainly because the fact that he would not be prescribed those at discharge.  He did complain of significant anxiety.  We discussed the fact that we would  restart his sertraline at 25 mg p.o. daily, and would also continue his gabapentin at 300 mg p.o. 3 times daily and attempt to control his anxiety.  Also with his history of alcohol withdrawal seizures and anxiety we made a decision to start Tegretol 100 mg p.o. 3 times daily.  This should help him with regards to seizure prophylaxis and anxiety.  His Protonix will be continued, and as well he will be started on folic acid as well as thiamine.  He will also have available trazodone for sleep as needed.  Review of his admission laboratories revealed essentially normal electrolytes including a creatinine of 0.97 and an AST of 24 and an ALT of 14.  His CBC was essentially normal including an MCV of 100.0.  Platelets were 354,000.  Sedimentation rate was 2.  Acetaminophen was less than 10, salicylate less than 7.  TSH was normal at 1.685.  Respiratory panel was negative for influenza A, B and coronavirus.  Again, as stated above his alcohol level was 279 on admission.  Drug screen was positive for cocaine.  EKG showed a normal sinus rhythm with an incomplete right bundle branch block and a normal QTc interval.  Currently his vital signs are stable, he is afebrile.  Pulse oximetry on room air was 96%.  We will get collateral information from his mother.  Observation Level/Precautions:  Detox 15 minute checks Seizure  Laboratory:  Chemistry Profile  Psychotherapy:    Medications:    Consultations:    Discharge Concerns:    Estimated LOS:  Other:     Physician  Treatment Plan for Primary Diagnosis: <principal problem not specified> Long Term Goal(s): Improvement in symptoms so as ready for discharge  Short Term Goals: Ability to identify changes in lifestyle to reduce recurrence of condition will improve, Ability to verbalize feelings will improve, Ability to disclose and discuss suicidal ideas, Ability to demonstrate self-control will improve, Ability to identify and develop effective coping behaviors will  improve, Ability to maintain clinical measurements within normal limits will improve, Compliance with prescribed medications will improve and Ability to identify triggers associated with substance abuse/mental health issues will improve  Physician Treatment Plan for Secondary Diagnosis: Active Problems:   Depression  Long Term Goal(s): Improvement in symptoms so as ready for discharge  Short Term Goals: Ability to identify changes in lifestyle to reduce recurrence of condition will improve, Ability to verbalize feelings will improve, Ability to disclose and discuss suicidal ideas, Ability to demonstrate self-control will improve, Ability to identify and develop effective coping behaviors will improve, Ability to maintain clinical measurements within normal limits will improve, Compliance with prescribed medications will improve and Ability to identify triggers associated with substance abuse/mental health issues will improve  I certify that inpatient services furnished can reasonably be expected to improve the patient's condition.    Antonieta Pert, MD 6/2/20222:46 PM

## 2021-05-25 NOTE — BHH Counselor (Signed)
Adult Comprehensive Assessment  Patient ID: John Vaughan, male   DOB: 10/24/1991, 30 y.o.   MRN: [id number]  Information Source: Information source: Patient  Current Stressors:  Patient states their primary concerns and needs for treatment are:: "Alcoholism" Patient states their goals for this hospitilization and ongoing recovery are:: "Pretty much to figure out how to get rid of the depression so I can stop drinking" Educational / Learning stressors: No stress. Employment / Job issues: "I depend on alcohol so much I'm a liability everywhere I apply to; Not currently employed" Family Relationships: "Just having to deal with everything that's happened in my life" Financial / Lack of resources (include bankruptcy): No stress Housing / Lack of housing: No stress Physical health (include injuries & life threatening diseases): "Back problems and shaking all the time, alcohol's the only thing that get's rid of it" Social relationships: No stress Substance abuse: "It's hard to afford it everyday but I always find a way to do it" Alcohol and cocaine use. Bereavement / Loss: "A lot of friends, a lot of family. Last one was my dad, he died in 11-20-2023 unexpectedly. Drinking increased as a result"  Living/Environment/Situation:  Living Arrangements: Parent Living conditions (as described by patient or guardian): "It's completely stressful; Needs met, we've got food, water, power" Who else lives in the home?: Mother. How long has patient lived in current situation?: "My whole life" What is atmosphere in current home: Other (Comment) ("Stressful; Everybody's still grieving")  Family History:  Marital status: Single Are you sexually active?: No What is your sexual orientation?: "Girls" Has your sexual activity been affected by drugs, alcohol, medication, or emotional stress?: "Yes" Lack of desire. Does patient have children?: Yes How many children?: 1 How is patient's relationship with  their children?: Pt states he hasn't seen his 30 1/2 year old son in 2 years  Childhood History:  By whom was/is the patient raised?: Both parents Additional childhood history information: "Other than figuring out what struggling is, it was great" Description of patient's relationship with caregiver when they were a child: "It was definitely good, I was a wild child but we still got along" Patient's description of current relationship with people who raised him/her: "Pt's father died in 2020/11/13 and he states he's been "completely depressed" since that time. I'm the only one that will take care of mom." How were you disciplined when you got in trouble as a child/adolescent?: "Spankings, grounding, normal [place] way" Does patient have siblings?: Yes Number of Siblings: 2 Description of patient's current relationship with siblings: "Both older, decent relationship, they've got their own family's so they do their own things" Did patient suffer any verbal/emotional/physical/sexual abuse as a child?: No Did patient suffer from severe childhood neglect?: No Has patient ever been sexually abused/assaulted/raped as an adolescent or adult?: No Was the patient ever a victim of a crime or a disaster?: No Witnessed domestic violence?: No Has patient been affected by domestic violence as an adult?: No  Education:  Highest grade of school patient has completed: Graduated high school Currently a student?: No Learning disability?: Yes What learning problems does patient have?: ADHD  Employment/Work Situation:   Employment situation: Unemployed Patient's job has been impacted by current illness: Yes Describe how patient's job has been impacted: Liability for potential employeers due to alcohol use. What is the longest time patient has a held a job?: 10 years Where was the patient employed at that time?: [employer]  patient ever been in the [employer]?: No  Financial  Resources:   Financial resources: Food stamps,Income from employment Does patient have a Programmer, applications or guardian?: No  Alcohol/Substance Abuse:   What has been your use of drugs/alcohol within the last 12 months?: "Daily drinking, used to be 40-ish 12oz beers a day, cut back to 12 12oz beers a day within the last couple months. Cocaine use, 2-3x in the last month" If attempted suicide, did drugs/alcohol play a role in this?: No Alcohol/Substance Abuse Treatment Hx: Past detox If yes, describe treatment: "Just detox, tried AA once but didn't like it" Has alcohol/substance abuse ever caused legal problems?: Yes (One DUI, a month ago.)  Social Support System:   Patient's Community Support System: Good Describe Community Support System: "My mom and my brothers" Type of faith/religion: Darrick Meigs How does patient's faith help to cope with current illness?: "A lot, pray"  Leisure/Recreation:   Do You Have Hobbies?: Yes Leisure and Hobbies: Pt reported working on cars, Dietitian, go for runs.  Strengths/Needs:   What is the patient's perception of their strengths?: "Quick learner and really good with my hands" Patient states they can use these personal strengths during their treatment to contribute to their recovery: "Helps me staying busy"  Discharge Plan:   Currently receiving community mental health services: No Patient states concerns and preferences for aftercare planning are: Prefer linked to outpatient and medication management through [facility]. Not interested in residential or SAIOP. Patient states they will know when they are safe and ready for discharge when: "I'm not feeling like I usually feel when I'm detoxing, I'm just having extreme issues with anxiety and panic attacks and difficulties sleeping" Does patient have access to transportation?: Yes Does patient have financial barriers related to discharge medications?: Yes Patient description of barriers related to discharge  medications: Barriers to paying for medication. Will patient be returning to same living situation after discharge?: Yes  Summary/Recommendations:   Summary and Recommendations (to be completed by the evaluator): John Vaughan is a 30 year old patient, admitted invol[facility]arily to [facility] due to SI with a plan, means, intent and increased depressive symptoms, after presenting to [facility] via [facility] due to mother contacting 60 with complaints of pt being destructive in the home and was holding a rifle to his head. Pt refused to corroborate as to whether this information was true or not, stating only that he did not ruin the guitar he was smashing into the floor. Stressors include grieving recent loss of father on October 25, 2020, caring for mother, inabilities to maintain employment due to alcoholism, and management of depression, anxiety, and alcohol use. Pt denies SI, HI, AVH. Endorses hx of NSSIB via cutting several months ago. Pt reports having gotten DUI 3 weeks ago while impaired on medication previously prescribed by [facility]. Pt has hx of seizures while detoxing from the use of EtOH. Pt reports last alcohol use being the day prior to admission, and substance use within the last 12 months consisting of daily alcohol use with hx of up to #40 12oz beers, having recently decreased over the last month to #12 12oz beers daily. Pt reports cocaine use 2-3x within the last month. Pt has hx of previous detox tx and attempted AA once, no other treatment history. Pt does not currently receive any other community supports and has requested referrals to community providers for continued medication management and outpatient services. Patient will benefit from crisis stabilization, medication evaluation, group therapy and  psychoeducation, in addition to case management for discharge planning. At discharge it is recommended that Patient adhere to the established discharge plan and continue in  treatment.  Blane Ohara. 05/25/2021

## 2021-05-25 NOTE — Progress Notes (Signed)
NUTRITION ASSESSMENT  Pt identified as at risk on the Malnutrition Screen Tool  INTERVENTION: 1. Supplements: Ensure Enlive po BID, each supplement provides 350 kcal and 20 grams of protein   NUTRITION DIAGNOSIS: Unintentional weight loss related to sub-optimal intake as evidenced by pt report.   Goal: Pt to meet >/= 90% of their estimated nutrition needs.  Monitor:  PO intake  Assessment:  Pt admitted for depression, anxiety and alcohol abuse. Pt with decreased appetite. Per weight records, pt has lost 14 lbs since 4/20 (7% x 1.5 months, significant for time frame). Will order Ensure supplements.   Height: Ht Readings from Last 1 Encounters:  05/24/21 6' (1.829 m)    Weight: Wt Readings from Last 1 Encounters:  05/24/21 76.7 kg    Weight Hx: Wt Readings from Last 10 Encounters:  05/24/21 76.7 kg  05/24/21 83.4 kg  04/12/21 83.4 kg  03/19/21 79.4 kg  09/05/20 79.4 kg  05/26/20 79.4 kg  04/22/20 79.4 kg  04/19/20 79.4 kg  03/09/19 83.9 kg  02/09/19 82.6 kg    BMI:  Body mass index is 22.92 kg/m. Pt meets criteria for normal based on current BMI.  Estimated Nutritional Needs: Kcal: 25-30 kcal/kg Protein: > 1 gram protein/kg Fluid: 1 ml/kcal  Diet Order:  Diet Order            Diet regular Room service appropriate? Yes; Fluid consistency: Thin  Diet effective now                Pt is also offered choice of unit snacks mid-morning and mid-afternoon.  Pt is eating as desired.   Lab results and medications reviewed.   Tilda Franco, MS, RD, LDN Inpatient Clinical Dietitian Contact information available via Amion

## 2021-05-25 NOTE — Progress Notes (Signed)
     05/25/21 0900  Psych Admission Type (Psych Patients Only)  Admission Status Involuntary  Psychosocial Assessment  Patient Complaints Anxiety  Eye Contact Fair  Facial Expression Anxious  Affect Anxious  Speech Logical/coherent  Interaction Assertive  Motor Activity Restless  Appearance/Hygiene Disheveled;Body odor;Poor hygiene  Behavior Characteristics Cooperative  Mood Pleasant;Depressed  Thought Process  Coherency WDL  Content WDL  Delusions None reported or observed  Perception WDL  Hallucination None reported or observed  Judgment Poor  Confusion None  Danger to Self  Current suicidal ideation? Denies  Danger to Others  Danger to Others None reported or observed      COVID-19 Daily Checkoff  Have you had a fever (temp > 37.80C/100F)  in the past 24 hours?  No  If you have had runny nose, nasal congestion, sneezing in the past 24 hours, has it worsened? No  COVID-19 EXPOSURE  Have you traveled outside the state in the past 14 days? No  Have you been in contact with someone with a confirmed diagnosis of COVID-19 or PUI in the past 14 days without wearing appropriate PPE? No  Have you been living in the same home as a person with confirmed diagnosis of COVID-19 or a PUI (household contact)? No  Have you been diagnosed with COVID-19? No

## 2021-05-25 NOTE — BHH Suicide Risk Assessment (Signed)
[facility] Admission Suicide Risk Assessment   Nursing information obtained from:  Patient Demographic factors:  Male,Adolescent or young adult,Caucasian,Low socioeconomic status,Unemployed Current Mental Status:  NA Loss Factors:  Financial problems / change in socioeconomic status Historical Factors:  Impulsivity Risk Reduction Factors:  Living with another person, especially a relative  Total Time spent with patient: 30 minutes Principal Problem: <principal problem not specified> Diagnosis:  Active Problems:   Depression  Subjective Data: Patient is seen and examined.  Patient is a 30 year old male with a past psychiatric history significant for alcohol dependence, cocaine use disorder, history of alcohol withdrawal related seizures who originally presented to the [facility] department on 05/23/2021.  He was evaluated by the comprehensive clinical assessment team.  He had been brought to the behavioral health urgent care center by the sheriff's department due to his mother calling 911 because the patient had been destructive in his home, was holding a rifle to his head, and also attempted to cut himself.  The patient stated that he had been depressed since approximately 11-23-2020.  He stated his father died for unknown cardiac reasons at that time.  The patient admitted that he had been cutting himself recently, and felt as though he was unable to talk to anyone.  He also got a DUI approximately 3 weeks prior to medicines.  He was taking lorazepam, Librium as well as clozapine.  That had been prescribed by [facility].  He denied suicidal or homicidal ideation.  There is no evidence of psychosis at that time.  He was transferred to the emergency department under involuntary commitment.  He was seen by psychiatric consultation on 05/24/2021.  At that time he stated that "I told my mom and to call the police".  He told me this morning that his mother had called  the police because she was concerned for possible suicidality.  He stated that he was trying to "clean his gun".  The patient admitted to a longstanding history of alcoholism.  He stated he drank between 12-24 beers a day.  He admitted to a longstanding history of alcohol dependence.  He did admit to the consult service that he felt as though his life was hitting a dead end.  He denied any previous suicide attempts or self-harm he denied any interest in going to a rehabilitation facility.  He was transferred to our facility for evaluation and stabilization.  Review of the electronic medical record revealed several hospitalizations and ER visits secondary to alcohol withdrawal syndromes.  He was admitted to the [facility] on 03/20/2021 secondary to alcohol withdrawal.  At that time he had been drinking between 24-30 beers a day, but had been cutting back and was only having 1-2 beers a day recently.  He began feeling weak and developing a fever.  He had mild diarrhea as well.  He was admitted for medical detoxification.  His discharge medications included Librium for taper as well as clonazepam 0.5 mg p.o. twice daily as needed.  He was also taking gabapentin as well as pantoprazole.  He had been prescribed sertraline 50 mg p.o. daily for depression.  His last psychiatric hospitalization was in December 2021.  He was admitted to the [facility] in [place].  He admitted at that time that he had had alcohol use disorder problems since age 34.  He stated he had been sober for approximately 2 months but then relapsed.  He reported a  history of withdrawal seizures as well as delirium tremens.  He had been medically admitted between 12/11-12/15 for alcohol withdrawal and a blood alcohol at that time that was 418.  He was admitted for detoxification at that time.  He was on the Librium taper and that was completed.  His discharge medications at that time included sertraline 100  mg p.o. daily, vitamin B12, gabapentin, multivitamin, Protonix.  He did not follow-up psychiatrically.  He presented on 04/12/2021 to an outpatient clinic.  He was prescribed oral naltrexone.  His blood alcohol on admission was 279.  Drug screen was positive for cocaine as well.  Continued Clinical Symptoms:  Alcohol Use Disorder Identification Test Final Score (AUDIT): 23 The "Alcohol Use Disorders Identification Test", Guidelines for Use in Primary Care, Second Edition.  World Science writer [facility]). Score between 0-7:  no or low risk or alcohol related problems. Score between 8-15:  moderate risk of alcohol related problems. Score between 16-19:  high risk of alcohol related problems. Score 20 or above:  warrants further diagnostic evaluation for alcohol dependence and treatment.   CLINICAL FACTORS:   Depression:   Aggression Comorbid alcohol abuse/dependence Hopelessness Impulsivity Insomnia Alcohol/Substance Abuse/Dependencies   Musculoskeletal: Strength & Muscle Tone: within normal limits Gait & Station: normal Patient leans: N/A  Psychiatric Specialty Exam:  Presentation  General Appearance: Disheveled  Eye Contact:Fair  Speech:Normal Rate  Speech Volume:Normal  Handedness:Right   Mood and Affect  Mood:Dysphoric  Affect:Congruent   Thought Process  Thought Processes:Coherent  Descriptions of Associations:Circumstantial  Orientation:Full (Time, Place and Person)  Thought Content:Rumination  History of Schizophrenia/Schizoaffective disorder:No  Duration of Psychotic Symptoms:No data recorded Hallucinations:Hallucinations: None  Ideas of Reference:None  Suicidal Thoughts:Suicidal Thoughts: No SI Active Intent and/or Plan: Without Intent; With Plan (Patient reported he was playing dangerously with knives, and cleaning his gun "It was pointing at me but I was just cleaning it")  Homicidal Thoughts:Homicidal Thoughts: No   Sensorium   Memory:Immediate Fair; Recent Fair; Remote Fair  Judgment:Impaired  Insight:Lacking   Executive Functions  Concentration:Fair  Attention Span:Fair  Recall:Poor  Fund of Knowledge:Fair  Language:Fair   Psychomotor Activity  Psychomotor Activity:Psychomotor Activity: Normal   Assets  Assets:Desire for Improvement; Resilience; Housing   Sleep  Sleep:Sleep: Good Number of Hours of Sleep: 6.75    Physical Exam: Physical Exam Vitals and nursing note reviewed.  HENT:     Head: Normocephalic and atraumatic.  Pulmonary:     Effort: Pulmonary effort is normal.  Neurological:     General: No focal deficit present.     Mental Status: He is alert and oriented to person, place, and time.    Review of Systems  Neurological: Positive for seizures.  Psychiatric/Behavioral: The patient is nervous/anxious.   All other systems reviewed and are negative.  Blood pressure 123/88, pulse 88, temperature 97.6 F (36.4 C), resp. rate 20, height 6' (1.829 m), weight 76.7 kg, SpO2 96 %. Body mass index is 22.92 kg/m.   COGNITIVE FEATURES THAT CONTRIBUTE TO RISK:  Thought constriction (tunnel vision)    SUICIDE RISK:   Moderate:  Frequent suicidal ideation with limited intensity, and duration, some specificity in terms of plans, no associated intent, good self-control, limited dysphoria/symptomatology, some risk factors present, and identifiable protective factors, including available and accessible social support.  PLAN OF CARE: Patient is seen and examined.  Patient is a 30 year old male with the above-stated past psychiatric history who was admitted secondary to concern for suicidal ideation as well as alcohol  dependence.  He will be admitted to the hospital.  He will be integrated in the milieu.  He will be encouraged to attend groups.  Throughout the interview today he was very med seeking.  He was looking for clonazepam or other benzodiazepines.  I explained to the patient that  he would be on a withdrawal protocol, and that would be giving benzodiazepines for certain parameters for withdrawal.  He continued to request clonazepam, and stated "it helps me not drink".  I informed him that I would not be able to prescribe those on a regular basis mainly because the fact that he would not be prescribed those at discharge.  He did complain of significant anxiety.  We discussed the fact that we would restart his sertraline at 25 mg p.o. daily, and would also continue his gabapentin at 300 mg p.o. 3 times daily and attempt to control his anxiety.  Also with his history of alcohol withdrawal seizures and anxiety we made a decision to start Tegretol 100 mg p.o. 3 times daily.  This should help him with regards to seizure prophylaxis and anxiety.  His Protonix will be continued, and as well he will be started on folic acid as well as thiamine.  He will also have available trazodone for sleep as needed.  Review of his admission laboratories revealed essentially normal electrolytes including a creatinine of 0.97 and an AST of 24 and an ALT of 14.  His CBC was essentially normal including an MCV of 100.0.  Platelets were 354,000.  Sedimentation rate was 2.  Acetaminophen was less than 10, salicylate less than 7.  TSH was normal at 1.685.  Respiratory panel was negative for influenza A, B and coronavirus.  Again, as stated above his alcohol level was 279 on admission.  Drug screen was positive for cocaine.  EKG showed a normal sinus rhythm with an incomplete right bundle branch block and a normal QTc interval.  Currently his vital signs are stable, he is afebrile.  Pulse oximetry on room air was 96%.  We will get collateral information from his mother.  I certify that inpatient services furnished can reasonably be expected to improve the patient's condition.   Antonieta Pert, MD 05/25/2021, 11:09 AM

## 2021-05-25 NOTE — Progress Notes (Signed)
   05/25/21 2109  Psych Admission Type (Psych Patients Only)  Admission Status Involuntary  Psychosocial Assessment  Patient Complaints Anxiety  Eye Contact Fair  Facial Expression Animated;Anxious  Affect Anxious;Appropriate to circumstance  Speech Logical/coherent  Interaction Assertive  Motor Activity Fidgety  Appearance/Hygiene Disheveled;Poor hygiene  Behavior Characteristics Cooperative;Appropriate to situation;Anxious;Fidgety  Mood Anxious;Pleasant  Thought Process  Coherency WDL  Content WDL  Delusions None reported or observed  Perception WDL  Hallucination None reported or observed  Judgment Poor  Confusion None  Danger to Self  Current suicidal ideation? Denies  Danger to Others  Danger to Others None reported or observed

## 2021-05-25 NOTE — Progress Notes (Signed)
Adult Psychoeducational Group Note  Date:  05/25/2021 Time:  11:40 AM  Group Topic/Focus:  Goals Group:   The focus of this group is to help patients establish daily goals to achieve during treatment and discuss how the patient can incorporate goal setting into their daily lives to aide in recovery.  Participation Level:  Active  Participation Quality:  Appropriate  Affect:  Appropriate  Cognitive:  Alert  Insight: Appropriate and Good  Engagement in Group:  Engaged  Modes of Intervention:  Discussion  Additional Comments:  Pt attended group and participated in discussion.  [name] Bushey R Marina Desire 05/25/2021, 11:40 AM

## 2021-05-26 MED ORDER — TRAZODONE HCL 100 MG PO TABS
100.0000 mg | ORAL_TABLET | Freq: Every evening | ORAL | Status: DC | PRN
  Administered 2021-05-26 – 2021-05-29 (×4): 100 mg via ORAL
  Filled 2021-05-26: qty 7
  Filled 2021-05-26 (×4): qty 1

## 2021-05-26 NOTE — BHH Counselor (Signed)
CSW sent a court letter to the [facility] of Court for Mr. Mauger showing that he was in the hospital and that this was the reason he missed his court date on 05/25/2021.  CSW placed this letter in the patient's chart.
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
## 2021-05-26 NOTE — Progress Notes (Signed)
Adult Psychoeducational Group Note  Date:  05/26/2021 Time:  10:13 PM  Group Topic/Focus:  Wrap-Up Group:   The focus of this group is to help patients review their daily goal of treatment and discuss progress on daily workbooks.  Participation Level:  Active  Participation Quality:  Appropriate  Affect:  Appropriate  Cognitive:  Alert  Insight: Appropriate  Engagement in Group:  Supportive  Modes of Intervention:  Discussion  Additional Comments:The patient participated in AA Group on this date with volunteers on how to decrease dependence on relationships while beginning to meet own needs, build confidence, and practice assertiveness--discussed how to demonstrate healthy communication that is honest and self-disclosing. End of Wrap-Up progress report for the AA support group.             Nicoletta Dress 05/26/2021, 10:13 PM

## 2021-05-26 NOTE — Tx Team (Signed)
Interdisciplinary Treatment and Diagnostic Plan Update  05/26/2021 Time of Session: John Vaughan MRN: [id number]  Principal Diagnosis: <principal problem not specified>  Secondary Diagnoses: Active Problems:   Depression   Current Medications:  Current Facility-Administered Medications  Medication Dose Route Frequency Provider Last Rate Last Admin  . acetaminophen (TYLENOL) tablet 650 mg  650 mg Oral Q6H PRN Sharma Covert, MD   650 mg at 05/25/21 2109  . alum & mag hydroxide-simeth (MAALOX/MYLANTA) 200-200-20 MG/5ML suspension 30 mL  30 mL Oral Q4H PRN Sharma Covert, MD      . carbamazepine (TEGRETOL) chewable tablet 100 mg  100 mg Oral TID Sharma Covert, MD   100 mg at 05/26/21 1314  . chlordiazePOXIDE (LIBRIUM) capsule 25 mg  25 mg Oral QID PRN Sharma Covert, MD   25 mg at 05/26/21 0806  . feeding supplement (ENSURE ENLIVE / ENSURE PLUS) liquid 237 mL  237 mL Oral BID BM Sharma Covert, MD      . folic acid (FOLVITE) tablet 1 mg  1 mg Oral Daily Sharma Covert, MD   1 mg at 05/26/21 0753  . gabapentin (NEURONTIN) capsule 300 mg  300 mg Oral TID Sharma Covert, MD   300 mg at 05/26/21 1315  . hydrOXYzine (ATARAX/VISTARIL) tablet 25 mg  25 mg Oral TID PRN Sharma Covert, MD   25 mg at 05/26/21 0941  . magnesium hydroxide (MILK OF MAGNESIA) suspension 30 mL  30 mL Oral Daily PRN Sharma Covert, MD      . pantoprazole (PROTONIX) EC tablet 40 mg  40 mg Oral Daily Sharma Covert, MD   40 mg at 05/26/21 0753  . sertraline (ZOLOFT) tablet 25 mg  25 mg Oral Daily Sharma Covert, MD   25 mg at 05/26/21 0753  . thiamine tablet 100 mg  100 mg Oral Daily Sharma Covert, MD   100 mg at 05/26/21 0753  . traZODone (DESYREL) tablet 100 mg  100 mg Oral QHS PRN Sharma Covert, MD       PTA Medications: Medications Prior to Admission  Medication Sig Dispense Refill Last Dose  . chlordiazePOXIDE (LIBRIUM) 25 MG capsule Take 25 mg by mouth 3  (three) times daily as needed for anxiety. (Patient not taking: Reported on 05/24/2021)     . clonazePAM (KLONOPIN) 0.5 MG tablet Take 1 tablet (0.5 mg total) by mouth 2 (two) times daily as needed for anxiety. 15 tablet 0   . gabapentin (NEURONTIN) 300 MG capsule Take 900 mg by mouth 3 (three) times daily.     . hydrOXYzine (ATARAX/VISTARIL) 25 MG tablet Take 1 tablet (25 mg total) by mouth 3 (three) times daily as needed. (Patient not taking: Reported on 05/24/2021) 60 tablet 1   . Multiple Vitamins-Minerals (MULTIVITAMIN ADULTS PO) Take 1 tablet by mouth daily.     . naltrexone (DEPADE) 50 MG tablet Take 1 tablet (50 mg total) by mouth daily. (Patient not taking: Reported on 05/24/2021) 30 tablet 3   . ondansetron (ZOFRAN) 4 MG tablet Take 1 tablet (4 mg total) by mouth every 6 (six) hours. (Patient taking differently: Take 4 mg by mouth every 8 (eight) hours as needed for nausea or vomiting.) 12 tablet 0   . pantoprazole (PROTONIX) 40 MG tablet Take 40 mg by mouth daily as needed (heartburn).     . sertraline (ZOLOFT) 50 MG tablet Take 1 tablet (50 mg total) by mouth daily. 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   Physician Treatment Plan for Primary Diagnosis: <principal problem not specified> Long Term Goal(s): Improvement in symptoms so as ready for discharge Improvement in symptoms so as ready for discharge   Short Term Goals: Ability to identify changes in lifestyle to reduce recurrence of condition will improve Ability to verbalize feelings will improve Ability to disclose and discuss suicidal ideas Ability to demonstrate  self-control will improve Ability to identify and develop effective coping behaviors will improve Ability to maintain clinical measurements within normal limits will improve Compliance with prescribed medications will improve Ability to identify triggers associated with substance abuse/mental health issues will improve Ability to identify changes in lifestyle to reduce recurrence of condition will improve Ability to verbalize feelings will improve Ability to disclose and discuss suicidal ideas Ability to demonstrate self-control will improve Ability to identify and develop effective coping behaviors will improve Ability to maintain clinical measurements within normal limits will improve Compliance with prescribed medications will improve Ability to identify triggers associated with substance abuse/mental health issues will improve  Medication Management: Evaluate patient's response, side effects, and tolerance of medication regimen.  Therapeutic Interventions: 1 to 1 sessions, Unit Group sessions and Medication administration.  Evaluation of Outcomes: Not Met  Physician Treatment Plan for Secondary Diagnosis: Active Problems:   Depression  Long Term Goal(s): Improvement in symptoms so as ready for discharge Improvement in symptoms so as ready for discharge   Short Term Goals: Ability to identify changes in lifestyle to reduce recurrence of condition will improve Ability to verbalize feelings will improve Ability to disclose and discuss suicidal ideas Ability to demonstrate self-control will improve Ability to identify and develop effective coping behaviors will improve Ability to maintain clinical measurements within normal limits will improve Compliance with prescribed medications will improve Ability to identify triggers associated with substance abuse/mental health issues will improve Ability to identify changes in lifestyle to reduce recurrence of condition will improve Ability  to verbalize feelings will improve Ability to disclose and discuss suicidal ideas Ability to demonstrate self-control will improve Ability to identify and develop effective coping behaviors will improve Ability to maintain clinical measurements within normal limits will improve Compliance with prescribed medications will improve Ability to identify triggers associated with substance abuse/mental health issues will improve     Medication Management: Evaluate patient's response, side effects, and tolerance of medication regimen.  Therapeutic Interventions: 1 to 1 sessions, Unit Group sessions and Medication administration.  Evaluation of Outcomes: Not Met   RN Treatment Plan for Primary Diagnosis: <principal problem not specified> Long Term Goal(s): Knowledge of disease and therapeutic regimen to maintain health will improve  Short Term Goals: Ability to demonstrate self-control, Ability to participate in decision making will improve and Ability to disclose and discuss suicidal ideas  Medication Management: RN will administer medications as ordered by provider, will assess and evaluate patient's response and provide education to patient for prescribed medication. RN will report any adverse and/or side effects to prescribing provider.  Therapeutic Interventions: 1 on 1 counseling sessions, Psychoeducation, Medication administration, Evaluate responses to treatment, Monitor vital signs and CBGs as ordered, Perform/monitor CIWA, COWS, AIMS and Fall Risk screenings as ordered, Perform wound care treatments as ordered.  Evaluation of Outcomes: Not  Met   LCSW Treatment Plan for Primary Diagnosis: <principal problem not specified> Long Term Goal(s): Safe transition to appropriate next level of care at discharge, Engage patient in therapeutic group addressing interpersonal concerns.  Short Term Goals: Engage patient in aftercare planning with referrals and resources, Increase social support and  Increase ability to appropriately verbalize feelings  Therapeutic Interventions: Assess for all discharge needs, 1 to 1 time with Social worker, Explore available resources and support systems, Assess for adequacy in community support network, Educate family and significant other(s) on suicide prevention, Complete Psychosocial Assessment, Interpersonal group therapy.  Evaluation of Outcomes: Not Met   Progress in Treatment: Attending groups: Yes. Participating in groups: Yes. Taking medication as prescribed: Yes. Toleration medication: Yes. Family/Significant other contact made: No, will contact:  mother Patient understands diagnosis: No. Discussing patient identified problems/goals with staff: Yes. Medical problems stabilized or resolved: Yes. Denies suicidal/homicidal ideation: Yes. Issues/concerns per patient self-inventory: Yes. Other: None  New problem(s) identified: No, Describe:  None  New Short Term/Long Term Goal(s):medication stabilization, elimination of SI thoughts, development of comprehensive mental wellness plan.  Patient Goals:    Discharge Plan or Barriers: Patient recently admitted. CSW will continue to follow and assess for appropriate referrals and possible discharge planning.  Reason for Continuation of Hospitalization: Depression Medication stabilization Withdrawal symptoms  Estimated Length of Stay: 3-5 days  Attendees: Patient: Did Not Attend 05/26/2021   Physician:  05/26/2021   Nursing:  05/26/2021   RN Care Manager: 05/26/2021   Social Worker: Toney Reil, LCSWA 05/26/2021   Recreational Therapist:  05/26/2021   Other:  05/26/2021   Other:  05/26/2021   Other: 05/26/2021      Scribe for Treatment Team: Mliss Fritz, Latanya Presser 05/26/2021 2:38 PM

## 2021-05-26 NOTE — Progress Notes (Signed)
Adult Psychoeducational Group Note  Date:  05/26/2021 Time:  11:34 AM  Group Topic/Focus:  Goals Group:   The focus of this group is to help patients establish daily goals to achieve during treatment and discuss how the patient can incorporate goal setting into their daily lives to aide in recovery.  Participation Level:  Active  Participation Quality:  Appropriate  Affect:  Appropriate  Cognitive:  Appropriate  Insight: Appropriate  Engagement in Group:  Engaged  Modes of Intervention:  Discussion  Additional Comments:  Pt states his goal for the day is to discuss his medication regimen.  Wynema Birch D 05/26/2021, 11:34 AM

## 2021-05-26 NOTE — Progress Notes (Signed)
Adult Psychoeducational Group Note  Date:  05/26/2021 Time:  12:15 AM  Group Topic/Focus:  Wrap-Up Group:   The focus of this group is to help patients review their daily goal of treatment and discuss progress on daily workbooks.  Participation Level:  Active  Participation Quality:  Appropriate, Attentive and Sharing  Affect:  Anxious and Appropriate  Cognitive:  Alert and Appropriate  Insight: Good  Engagement in Group:  Engaged  Modes of Intervention:  Discussion and Support  Additional Comments:  Today pt goal was to eat more. Pt denies SI/HI. Pt did not receive/ dialed out any phone calls today. Pt contracts for safety.   John Vaughan 05/26/2021, 12:15 AM

## 2021-05-26 NOTE — Progress Notes (Addendum)
   05/26/21 2130  Psych Admission Type (Psych Patients Only)  Admission Status Involuntary  Psychosocial Assessment  Patient Complaints Anxiety  Eye Contact Fair  Facial Expression Anxious  Affect Anxious;Appropriate to circumstance  Speech Logical/coherent  Interaction Assertive  Motor Activity Other (Comment) (wnl)  Appearance/Hygiene Disheveled;Poor hygiene  Behavior Characteristics Cooperative;Appropriate to situation  Mood Anxious;Pleasant  Thought Process  Coherency WDL  Content WDL  Delusions None reported or observed  Perception WDL  Hallucination None reported or observed  Judgment Poor  Confusion None  Danger to Self  Current suicidal ideation? Denies  Danger to Others  Danger to Others None reported or observed   Pt says he had a good day. "There's a group of [place] who have been staying together and laughing and talking. I've felt better because I haven't been isolating and have been getting out and making friends." Pt denies SI, HI, AVH and pain. Rates anxiety 7/10. Asked about Librium. Pt told he was not on a taper but could get it if his CIWA score was high enough to warrant it. Pt given Vistaril 25 mg for his anxiety.

## 2021-05-26 NOTE — [facility] Group Notes (Signed)
[facility] LCSW Group Therapy Note   Type of Therapy and Topic:  Group Therapy:  Feelings About Hospitalization  Participation Level:  Active   Description of Group This process group involved patients discussing their feelings related to being hospitalized, as well as the benefits they see to being in the hospital.  These feelings and benefits were itemized.  The group then brainstormed specific ways in which they could seek those same benefits when they discharge and return home.  Therapeutic Goals 1. Patient will identify and describe positive and negative feelings related to hospitalization 2. Patient will verbalize benefits of hospitalization to themselves personally 3. Patients will brainstorm together ways they can obtain similar benefits in the outpatient setting, identify barriers to wellness and possible solutions  Summary of Patient Progress:  John Vaughan states that today he is happy because he is meeting new people.  John Vaughan accepted the worksheet that was provided and joined in discussion with his peers.   Therapeutic Modalities Cognitive Behavioral Therapy Motivational Interviewing

## 2021-05-26 NOTE — Progress Notes (Signed)
Recreation Therapy Notes  Date:  6.3.22 Time: 0930 Location: [address] Dayroom  Group Topic: Stress Management  Goal Area(s) Addresses:  Patient will identify positive stress management techniques. Patient will identify benefits of using stress management post d/c.  Behavioral Response: Attentive  Intervention: Stress Management   Activity: Meditation.  LRT played a meditation that focused on looking at each day as a new opportunity to start your day on a good note.    Education:  Stress Management, Discharge Planning.   Education Outcome: Acknowledges Education  Clinical Observations/Feedback: Pt attended and participated in the activity.  Pt had no questions or concerns.     Caroll Rancher, LRT/CTRS         Caroll Rancher A 05/26/2021 12:19 PM

## 2021-05-27 DIAGNOSIS — F10929 Alcohol use, unspecified with intoxication, unspecified: Secondary | ICD-10-CM

## 2021-05-27 DIAGNOSIS — F1094 Alcohol use, unspecified with alcohol-induced mood disorder: Secondary | ICD-10-CM

## 2021-05-27 MED ORDER — HYDROXYZINE HCL 50 MG PO TABS
50.0000 mg | ORAL_TABLET | Freq: Three times a day (TID) | ORAL | Status: DC | PRN
  Administered 2021-05-27 – 2021-05-30 (×7): 50 mg via ORAL
  Filled 2021-05-27: qty 10
  Filled 2021-05-27 (×7): qty 1

## 2021-05-27 NOTE — Progress Notes (Signed)
   05/27/21 2214  Psych Admission Type (Psych Patients Only)  Admission Status Involuntary  Psychosocial Assessment  Patient Complaints Anxiety  Eye Contact Fair  Facial Expression Anxious  Affect Appropriate to circumstance;Anxious  Speech Logical/coherent  Interaction Assertive  Motor Activity Other (Comment) (wnl)  Appearance/Hygiene Unremarkable  Behavior Characteristics Cooperative;Appropriate to situation  Mood Anxious  Thought Process  Coherency WDL  Content WDL  Delusions None reported or observed  Perception WDL  Hallucination None reported or observed  Judgment Poor  Confusion None  Danger to Self  Current suicidal ideation? Denies  Danger to Others  Danger to Others None reported or observed   Pt seen in dayroom interacting with peers. Pt denies SI, HI, AVH and pain. Pt went outside and played basketball today. Anxiety 5/10. Informed pt that his Vistaril was increased to 50 mg to assist with persistent anxiety since pt isn't scoring on CIWA and doesn't meet parameters for Librium. Pt verbalized understanding.

## 2021-05-27 NOTE — Progress Notes (Signed)
   05/27/21 1200  Psych Admission Type (Psych Patients Only)  Admission Status Involuntary  Psychosocial Assessment  Patient Complaints Anxiety  Eye Contact Fair  Facial Expression Anxious  Affect Appropriate to circumstance  Speech Logical/coherent  Interaction Assertive  Motor Activity Other (Comment) (steady gait)  Appearance/Hygiene Disheveled  Behavior Characteristics Cooperative;Appropriate to situation  Mood Anxious;Pleasant  Thought Process  Coherency WDL  Content WDL  Delusions None reported or observed  Perception WDL  Hallucination None reported or observed  Judgment Poor  Confusion None  Danger to Self  Current suicidal ideation? Denies  Danger to Others  Danger to Others None reported or observed
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
## 2021-05-28 NOTE — Plan of Care (Signed)
Nurse discussed coping skills with patient.  

## 2021-05-28 NOTE — Progress Notes (Signed)
[facility] MD Progress Note  05/28/2021 2:15 PM John Vaughan  MRN:  [id number] Subjective:  " I am more anxious than any thing else, I have suffered from Alcoholism from my young age"  Reason for Hospitalization:  Patient is a 30 year old male with a past psychiatric history significant for alcohol dependence, cocaine use disorder, history of alcohol withdrawal related seizures who originally presented to the [facility] emergency department on 05/23/2021. He was evaluated by the comprehensive clinical assessment team. He had been brought to the behavioral health urgent care center by the sheriff's department due to his mother calling 911 because the patient had been destructive in his home, was holding a rifle to his head, and also attempted to cut himself. The patient stated that he had been depressed since approximately November 2021. Objective : Report received, Records reviewed and care plan reviewed with Members of our interdisciplinary team.  Patient was met this afternoon for daily evaluation.  He was shaking and shivering earlier with complaint of anxiety and was given Hydroxyzine.  Less than three hours he was still shaking.  He scored  10 in his CIWA evaluation and provider asked Nurse to give a dose of Librium.  During my interaction with patient again he was calm with no shakes or tremors.  Patient took last alcohol according to him about 6 days ago.  Patient reported that he became depressed and anxious after his mother was diagnosed with a rare disease.  He is the only son out of three that is helping his mother he reported.  He did not say what he does for his mother. He reports some family frictions and is looking forward to initiating Family therapy.  He is also interested in outpatient substance abuse therapy for Alcohol.  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  Today he denied feeling suicidal and also denied AVH.  He participates in group  therapy in the unit.  He is pleasant and polite and plan is to discharge tomorrow.  Patient is on seizure precaution due to alcohol related sezures.  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 Alcohol abuse   . Anxiety   . Depression    History reviewed. No pertinent surgical history. Family History:  Family History  Problem Relation Age of Onset  . Anxiety disorder Mother   . Alcohol abuse Father   . Anxiety disorder Maternal Aunt   . Anxiety disorder Maternal Grandmother   . Alcohol abuse Paternal Grandfather    Family Psychiatric  History: See H&P note Social History:  Social History   Substance and Sexual Activity  Alcohol Use Yes  . Alcohol/week: 18.0 standard drinks  . Types: 18 Cans of beer per week   Comment: states that he drinks 2-4 beers daily     Social History   Substance and Sexual Activity  Drug Use Not Currently  . Types: Marijuana, Cocaine    Social History   Socioeconomic History  . Marital status: Single    Spouse name: Not on file  . Number of children: Not on file  . Years of education: Not on  file  . Highest education level: Not on file  Occupational History  . Not on file  Tobacco Use  . Smoking status: Current Every Day Smoker    Packs/day: 1.00    Types: Cigarettes  . Smokeless tobacco: Former Systems developer    Types: Snuff  Vaping Use  . Vaping Use: Never used  Substance and Sexual Activity  . Alcohol use: Yes    Alcohol/week: 18.0 standard drinks    Types: 18 Cans of beer per week    Comment: states that he drinks 2-4 beers daily  .  Drug use: Not Currently    Types: Marijuana, Cocaine  . Sexual activity: Not Currently  Other Topics Concern  . Not on file  Social History Narrative  . Not on file   Social Determinants of Health   Financial Resource Strain: Not on file  Food Insecurity: Not on file  Transportation Needs: Not on file  Physical Activity: Not on file  Stress: Not on file  Social Connections: Not on file   Additional Social History:                         Sleep: Good  Appetite:  Good  Current Medications: Current Facility-Administered Medications  Medication Dose Route Frequency Provider Last Rate Last Admin  . acetaminophen (TYLENOL) tablet 650 mg  650 mg Oral Q6H PRN Sharma Covert, MD   650 mg at 05/25/21 2109  . alum & mag hydroxide-simeth (MAALOX/MYLANTA) 200-200-20 MG/5ML suspension 30 mL  30 mL Oral Q4H PRN Sharma Covert, MD      . carbamazepine (TEGRETOL) chewable tablet 100 mg  100 mg Oral TID Sharma Covert, MD   100 mg at 05/28/21 1149  . chlordiazePOXIDE (LIBRIUM) capsule 25 mg  25 mg Oral QID PRN Sharma Covert, MD   25 mg at 05/28/21 1155  . feeding supplement (ENSURE ENLIVE / ENSURE PLUS) liquid 237 mL  237 mL Oral BID BM Sharma Covert, MD   237 mL at 05/28/21 1100  . folic acid (FOLVITE) tablet 1 mg  1 mg Oral Daily Sharma Covert, MD   1 mg at 05/28/21 0810  . gabapentin (NEURONTIN) capsule 300 mg  300 mg Oral TID Sharma Covert, MD   300 mg at 05/28/21 1149  . hydrOXYzine (ATARAX/VISTARIL) tablet 50 mg  50 mg Oral TID PRN Bobbitt, Shalon E, NP   50 mg at 05/28/21 0813  . magnesium hydroxide (MILK OF MAGNESIA) suspension 30 mL  30 mL Oral Daily PRN Sharma Covert, MD      . pantoprazole (PROTONIX) EC tablet 40 mg  40 mg Oral Daily Sharma Covert, MD   40 mg at 05/28/21 3212  . sertraline (ZOLOFT) tablet 25 mg  25 mg Oral Daily Sharma Covert, MD   25 mg at 05/28/21 0809  . thiamine tablet 100 mg  100 mg Oral Daily Sharma Covert, MD   100 mg at 05/28/21 0809  . traZODone (DESYREL) tablet 100 mg  100 mg Oral QHS PRN Sharma Covert, MD   100 mg at 05/27/21 2104    Lab Results: No results found for this or any previous visit (from the past 67 hour(s)).  Blood Alcohol level:  Lab Results  Component Value Date   ETH 279 (H) 05/24/2021   ETH 148 (H) 24/82/5003    Metabolic Disorder Labs: No results found for: HGBA1C, MPG No results found  for: PROLACTIN No results found for: CHOL, TRIG, HDL, CHOLHDL, VLDL, LDLCALC  Physical Findings: AIMS:  , ,  ,  ,    CIWA:  CIWA-Ar Total: 10 COWS:     Musculoskeletal: Strength & Muscle Tone: within normal limits Gait & Station: normal Patient leans: Front  Psychiatric Specialty Exam: Psychiatric Specialty Exam: Physical Exam Vitals and nursing note reviewed.  Constitutional:      Appearance: Normal appearance.  HENT:     Head: Normocephalic and atraumatic.  Cardiovascular:     Rate and Rhythm: Normal rate.  Pulmonary:     Effort: Pulmonary effort is normal.  Musculoskeletal:     Cervical back: Normal range of motion.  Neurological:     Mental Status: He is alert.     Review of Systems  Constitutional: Negative.   HENT: Negative.   Eyes: Negative.   Respiratory: Negative.   Cardiovascular: Negative.   Genitourinary: Negative.   Musculoskeletal: Negative.   Skin: Negative.   Neurological: Negative.   Psychiatric/Behavioral: Positive for depression.    Blood pressure 103/77, pulse 71, temperature (!) 97.5 F (36.4 C), temperature source Oral, resp. rate (!) 6, height 6' (1.829 m), weight 76.7 kg, SpO2 100 %.Body mass index is 22.92 kg/m.  General Appearance: Casual and Neat  Eye Contact:  Fair  Speech:  Clear and Coherent  Volume:  Normal  Mood:  Anxious  Affect:  Congruent  Thought Process:  Coherent  Orientation:  Full (Time, Place, and Person)  Thought Content:  Logical  Suicidal Thoughts:  No  Homicidal Thoughts:  No  Memory:   Immediate;   Good Recent;   Good Remote;   Good  Judgement:  Fair  Insight:  Good  Psychomotor Activity:  Normal  Concentration:  Concentration: Good and Attention Span: Good  Recall:  Good  Fund of Knowledge:  Good  Language:  Good  Akathisia:  No  Handed:  Right  AIMS (if indicated):     Assets:  Communication Skills Desire for Improvement Financial Resources/Insurance Housing Physical Health  ADL's:  Intact  Cognition:  WNL  Sleep:  Number of Hours: 6.75    Presentation  General Appearance: Fairly Groomed  Eye Contact:Fair  Speech:Normal Rate  Speech Volume:Normal  Handedness:Right   Mood and Affect  Mood:Euthymic  Affect:Appropriate   Thought Process  Thought Processes:Coherent  Descriptions of Associations:Intact  Orientation:Full (Time, Place and Person)  Thought Content:Logical  History of Schizophrenia/Schizoaffective disorder:No  Duration of Psychotic Symptoms:No data recorded Hallucinations:No data recorded Ideas of Reference:None  Suicidal Thoughts:No data recorded Homicidal Thoughts:No data recorded  Sensorium  Memory:Immediate Fair; Recent Fair; Remote [place]   Executive Functions  Concentration:Fair  Attention Span:Fair  [place]  Language:Good   Psychomotor Activity  Psychomotor Activity:No data recorded  Assets  Assets:Desire for Improvement; Resilience   Sleep  Sleep:No data recorded   Physical Exam: Physical Exam Vitals and nursing note reviewed.  Constitutional:      Appearance: Normal appearance.  HENT:     Head: Normocephalic and atraumatic.  Cardiovascular:     Rate and Rhythm: Normal rate.  Pulmonary:     Effort: Pulmonary effort is normal.  Musculoskeletal:     Cervical back: Normal range of motion.  Neurological:     Mental Status: He is alert.    Review of Systems  Constitutional: Negative.   HENT: Negative.   Eyes:  Negative.   Respiratory: Negative.   Cardiovascular: Negative.   Genitourinary:  Negative.   Musculoskeletal: Negative.   Skin: Negative.   Neurological: Negative.   Endo/Heme/Allergies: Negative.   Psychiatric/Behavioral: Positive for depression.   Blood pressure 103/77, pulse 71, temperature (!) 97.5 F (36.4 C), temperature source Oral, resp. rate (!) 6, height 6' (1.829 m), weight 76.7 kg, SpO2 100 %. Body mass index is 22.92 kg/m.   Treatment Plan Summary:  Patient is doing well and is not withdrawing from Alcohol based on CIWA Score.  He will continue taking his  Tegretol and Gabapentin to prevent alcohol withdrawal seizure disorder.  Plan is possibly to discharge him tomorrow. Daily contact with patient to assess and evaluate symptoms and progress in treatment and Medication management   -Continue Tegretol 100 mg po tid for seizure prevention - Continue Gabapentin 300 mg po tid for Alcohol associated withdrawal seizure prevention -Continue Sertraline 25 mg po daily for depression/Anxiety -Continue Pantoprazole 40 mg po daily for GI discomfort  -Offer Trazodone 50 mg po at bed time as needed for sleep --Continue Hydroxyzine 25 mgh po 3 times daily for anxiety -Continue Seizure precaution  Delfin Gant, NP-PMHNP-BC 05/28/2021, 2:15 PM

## 2021-05-28 NOTE — [facility] Group Notes (Signed)
[facility] LCSW Group Therapy Note  05/28/2021    Type of Therapy and Topic:  Group Therapy:  A Hero Worthy of Support  Participation Level:  Active   Description of Group:  Patients in this group were introduced to the concept that additional supports including self-support are an essential part of recovery.  Matching needs with supports to help fulfill those needs was explained.  Establishing boundaries that can gradually be increased or decreased was described, with patients giving their own examples of establishing appropriate boundaries in their lives.  A song entitled "My Own Hero" was played and a group discussion ensued in which patients stated it inspired them to help themselves in order to succeed, because other people cannot achieve their goals such as sobriety or stability for them.  A song was played called "I Am Enough" which led to a discussion about being willing to believe we are worth the effort of being a self-support.   Therapeutic Goals: 1)  demonstrate the importance of being a key part of one's own support system 2)  discuss various available supports 3)  encourage patient to use music as part of their self-support and focus on goals 4)  elicit ideas from patients about supports that need to be added   Summary of Patient Progress:  The patient expressed that he needs his family to be a better support to him, and he needs peers like the friends he has met in the hospital and laughed with so much.  He also acknowledged that he needs to keep his hands busy and said he would love to have a job.  He talked about the detrimental effects his brothers have on his desire to be sober, because they do not understand why they can drink within reason and he cannot.  They always drink around him, which makes it impossible for him to remain sober.  We talked about various ways he might approach setting up boundaries, none of which sounded feasible to him.   Therapeutic Modalities:   Motivational  Interviewing Activity  Maretta Los

## 2021-05-28 NOTE — [facility] Group Notes (Signed)
Adult Psychoeducational Group Note  Date:  05/28/2021 Time:  10:33 AM  Group Topic/Focus:  Goals Group:   The focus of this group is to help patients establish daily goals to achieve during treatment and discuss how the patient can incorporate goal setting into their daily lives to aide in recovery.  Participation Level:  Active  Participation Quality:  Appropriate  Affect:  Appropriate  Cognitive:  Appropriate  Insight: Appropriate  Engagement in Group:  Engaged  Modes of Intervention:  Education  Additional Comments:  Pt goal today is to talk more with friends.Pt has no feelings of wanting to hurt himself or others.  Christyna Letendre, Sharen Counter 05/28/2021, 10:33 AM

## 2021-05-28 NOTE — Progress Notes (Signed)
The patient's positive event for the day is that he played 74 games of chess with one of his peers. His goal for tomorrow is to get discharged.

## 2021-05-28 NOTE — Progress Notes (Signed)
   05/28/21 2337  Psych Admission Type (Psych Patients Only)  Admission Status Involuntary  Psychosocial Assessment  Patient Complaints Anxiety  Eye Contact Fair  Facial Expression Anxious  Affect Appropriate to circumstance;Anxious  Speech Logical/coherent  Interaction Assertive  Motor Activity Other (Comment) (wnl)  Appearance/Hygiene Unremarkable  Behavior Characteristics Cooperative;Anxious  Mood Anxious  Thought Process  Coherency WDL  Content WDL  Delusions None reported or observed  Perception WDL  Hallucination None reported or observed  Judgment Poor  Confusion None  Danger to Self  Current suicidal ideation? Denies  Danger to Others  Danger to Others None reported or observed  D: Patient in dayroom reports he had a good day. Pt denies any withdrawal symptoms. Pt reports he is tolerating medication well. A: Medications administered as prescribed. Support and encouragement provided as needed.  R: Patient remains safe on the unit. Will continue to monitor for safety and stability.

## 2021-05-29 LAB — COMPREHENSIVE METABOLIC PANEL
ALT: 15 U/L (ref 0–44)
AST: 20 U/L (ref 15–41)
Albumin: 4.5 g/dL (ref 3.5–5.0)
Alkaline Phosphatase: 44 U/L (ref 38–126)
Anion gap: 8 (ref 5–15)
BUN: 14 mg/dL (ref 6–20)
CO2: 28 mmol/L (ref 22–32)
Calcium: 9.8 mg/dL (ref 8.9–10.3)
Chloride: 102 mmol/L (ref 98–111)
Creatinine, Ser: 1.17 mg/dL (ref 0.61–1.24)
GFR, Estimated: >60 mL/min (ref >60)
Glucose, Bld: 85 mg/dL (ref 70–99)
Potassium: 4.2 mmol/L (ref 3.5–5.1)
Sodium: 138 mmol/L (ref 135–145)
Total Bilirubin: 0.3 mg/dL (ref 0.3–1.2)
Total Protein: 8 g/dL (ref 6.5–8.1)

## 2021-05-29 LAB — CBC WITH DIFFERENTIAL/PLATELET
Abs Immature Granulocytes: 0.02 10*3/uL (ref 0.00–0.07)
Basophils Absolute: 0.1 10*3/uL (ref 0.0–0.1)
Basophils Relative: 2 %
Eosinophils Absolute: 0.4 10*3/uL (ref 0.0–0.5)
Eosinophils Relative: 6 %
HCT: 44.1 % (ref 39.0–52.0)
Hemoglobin: 14.9 g/dL (ref 13.0–17.0)
Immature Granulocytes: 0 %
Lymphocytes Relative: 22 %
Lymphs Abs: 1.6 10*3/uL (ref 0.7–4.0)
MCH: 35.2 pg — ABNORMAL HIGH (ref 26.0–34.0)
MCHC: 33.8 g/dL (ref 30.0–36.0)
MCV: 104.3 fL — ABNORMAL HIGH (ref 80.0–100.0)
Monocytes Absolute: 1.1 10*3/uL — ABNORMAL HIGH (ref 0.1–1.0)
Monocytes Relative: 15 %
Neutro Abs: 4 10*3/uL (ref 1.7–7.7)
Neutrophils Relative %: 55 %
Platelets: 247 10*3/uL (ref 150–400)
RBC: 4.23 MIL/uL (ref 4.22–5.81)
RDW: 12.8 % (ref 11.5–15.5)
WBC: 7.3 10*3/uL (ref 4.0–10.5)
nRBC: 0 % (ref 0.0–0.2)

## 2021-05-29 MED ORDER — SERTRALINE HCL 50 MG PO TABS
50.0000 mg | ORAL_TABLET | Freq: Every day | ORAL | Status: DC
  Administered 2021-05-30: 50 mg via ORAL
  Filled 2021-05-29 (×2): qty 1

## 2021-05-29 MED ORDER — GABAPENTIN 400 MG PO CAPS
400.0000 mg | ORAL_CAPSULE | Freq: Three times a day (TID) | ORAL | Status: DC
  Administered 2021-05-29 – 2021-05-30 (×4): 400 mg via ORAL
  Filled 2021-05-29 (×3): qty 1
  Filled 2021-05-29: qty 21
  Filled 2021-05-29: qty 1
  Filled 2021-05-29: qty 21
  Filled 2021-05-29: qty 1
  Filled 2021-05-29: qty 21
  Filled 2021-05-29: qty 1

## 2021-05-29 NOTE — Progress Notes (Signed)
Recreation Therapy Notes  Date:  6.6.22 Time: 0930 Location: [address] Dayroom  Group Topic: Stress Management  Goal Area(s) Addresses:  Patient will identify positive stress management techniques. Patient will identify benefits of using stress management post d/c.  Intervention: Stress Management  Activity: Meditation.  LRT played a meditation that focused on setting boundaries and that it's ok to say no for your own self care even if it's not what others want.    Education:  Stress Management, Discharge Planning.   Education Outcome: Acknowledges Education  Clinical Observations/Feedback: Pt did not attend group session.     Caroll Rancher, LRT/CTRS         Caroll Rancher A 05/29/2021 12:26 PM

## 2021-05-29 NOTE — B[facility] Counselor (Signed)
CSW spoke with Aras Albarran [phone] (Mother) who states that she cannot find the hidden firearm in his bedroom and that the bedroom is such a mess that she cannot get into it to look for the firearm.  Mrs. Moss states that she has a lock box ready once it is found but states that she cannot get things out of his room to look for it.  CSW spoke with Mrs. Andria Meuse about having her son help her look for it once he is discharged.  Mrs. Wuertz states that she is aware that [facility] cannot hold her son until the firearm is found and states that she will help him look for it once he is back home.  Mrs. Dragoo states that he son is allowed to come back home.

## 2021-05-29 NOTE — Plan of Care (Signed)
Nurse discussed coping skills and anxiety with patient.  

## 2021-05-29 NOTE — Progress Notes (Signed)
D:  Patient's self inventory sheet, patient has fair sleep, sleep medication helpful.  Good appetite, normal energy level, good concentration.  Rated depression and anxiety 2, denied hopeless.  Withdrawals, tremors, cravings, agitation, irritability.  Denied SI.  Denied physical problems.   No goal, no plans.  Does have discharge plans. A:  Medications administered per MD orders.  Emotional support and encouragement given patient. R:  Denied SI and HI, contracts for safety.  Denied A/V hallucinations.  Denied pain.  Safety maintained with 15 minute checks.
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
## 2021-05-29 NOTE — Progress Notes (Signed)
Adult Psychoeducational Group Note  Date:  05/29/2021 Time:  8:59 PM  Group Topic/Focus:  Wrap-Up Group:   The focus of this group is to help patients review their daily goal of treatment and discuss progress on daily workbooks.  Participation Level:  Active  Participation Quality:  Appropriate  Affect:  Appropriate  Cognitive:  Appropriate  Insight: Appropriate  Engagement in Group:  Engaged  Modes of Intervention:  Confrontation  Additional Comments:  Patient said his day was 7. His goal for today discharge plan and yes he achieved his goal. His coping skills was talking.  Charna Busman Long 05/29/2021, 8:59 PM

## 2021-05-29 NOTE — BHH Group Notes (Signed)
Type of Therapy and Topic:  Group Therapy - Healthy vs Unhealthy Coping Skills  Participation Level:  Active   Description of Group The focus of this group was to determine what unhealthy coping techniques typically are used by group members and what healthy coping techniques would be helpful in coping with various problems. Patients were guided in becoming aware of the differences between healthy and unhealthy coping techniques. Patients were asked to identify 2-3 healthy coping skills they would like to learn to use more effectively.  Therapeutic Goals 1. Patients learned that coping is what human beings do all day long to deal with various situations in their lives 2. Patients defined and discussed healthy vs unhealthy coping techniques 3. Patients identified their preferred coping techniques and identified whether these were healthy or unhealthy 4. Patients determined 2-3 healthy coping skills they would like to become more familiar with and use more often. 5. Patients provided support and ideas to each other   Summary of Patient Progress:  During group, Syd spent time talking with his peers and engaging in self-care by sharing openly with others.  Sherrick stated that a coping skill he uses in working on car. Patient had to be reminded during group to give respect to other peers by not talking while other people were sharing about their coping skill.s

## 2021-05-29 NOTE — BHH Group Notes (Signed)
Occupational Therapy Group Note Date: 05/29/2021 Group Topic/Focus: Stress Management  Group Description: Group encouraged increased participation and engagement through discussion focused on topic of stress management. Patients engaged interactively to discuss components of stress including physical signs, emotional signs, negative management strategies, and positive management strategies. Each individual identified one new stress management strategy they would like to try moving forward.    Therapeutic Goals: Identify current stressors Identify healthy vs unhealthy stress management strategies/techniques Discuss and identify physical and emotional signs of stress Participation Level: Patient did not attend OT group session despite personal invitation.    Plan: Continue to engage patient in OT groups 2 - 3x/week.  05/29/2021  Weston Fulco, MOT, OTR/L   
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
## 2021-05-29 NOTE — BHH Group Notes (Signed)
Spiritual care group on grief and loss facilitated by chaplain Janetta Hora Lomax, BCC, LCAS-A  Group Goal: Support / Education around grief and loss   Members engage in facilitated group support and psycho-social education.   Group Description:   Following introductions and group rules, group members engaged in facilitated group dialogue and support around topic of loss, with particular support around experiences of loss in their lives. Group Identified types of loss (relationships / self / things) and identified their own healthy and unhealthy coping patternss as well as childhood messages received about grief. Reflected on thoughts / feelings around loss, normalized grief responses, and recognized variety in grief experience.   Group drew on Adlerian / Rogerian, narrative, Motivational Interviewing, education about mindfulness   Level of Engagement/Patient Progress: Pt began the group stating that he didn't have anything to be hopeful for today and stated that he has had a number of friends and family members die. Throughout group he engaged in side conversation with another group member, which he dismissed as being supportive conversation to make her laugh and distract her from her feelings of pain. He said when he uses music to cope he tends to take it too far and ends up crying and unable to pull himself out of it.   Maryanna Shape. Carley Hammed, M.Div. [facility] Chaplain Pager [phone] Office [phone]

## 2021-05-30 DIAGNOSIS — F32A Depression, unspecified: Secondary | ICD-10-CM

## 2021-05-30 DIAGNOSIS — F411 Generalized anxiety disorder: Secondary | ICD-10-CM

## 2021-05-30 DIAGNOSIS — F1020 Alcohol dependence, uncomplicated: Secondary | ICD-10-CM

## 2021-05-30 LAB — CARBAMAZEPINE LEVEL, TOTAL: Carbamazepine Lvl: 7.3 ug/mL (ref 4.0–12.0)

## 2021-05-30 MED ORDER — HYDROXYZINE HCL 50 MG PO TABS: 50.0000 mg | ORAL_TABLET | Freq: Three times a day (TID) | ORAL | 0 refills | Status: DC | PRN

## 2021-05-30 MED ORDER — GABAPENTIN 400 MG PO CAPS: 400.0000 mg | ORAL_CAPSULE | Freq: Three times a day (TID) | ORAL | 0 refills | Status: DC

## 2021-05-30 MED ORDER — SERTRALINE HCL 50 MG PO TABS: 100.0000 mg | ORAL_TABLET | Freq: Every day | ORAL | 0 refills | Status: DC

## 2021-05-30 MED ORDER — MULTIVITAMIN ADULTS PO TABS: 1.0000 | ORAL_TABLET | Freq: Every day | ORAL | 0 refills | Status: DC

## 2021-05-30 MED ORDER — SERTRALINE HCL 100 MG PO TABS
100.0000 mg | ORAL_TABLET | Freq: Every day | ORAL | Status: DC
  Filled 2021-05-30: qty 7
  Filled 2021-05-30: qty 1

## 2021-05-30 MED ORDER — TRAZODONE HCL 100 MG PO TABS: 100.0000 mg | ORAL_TABLET | Freq: Every evening | ORAL | 0 refills | Status: DC | PRN

## 2021-05-30 MED ORDER — SERTRALINE HCL 50 MG PO TABS
50.0000 mg | ORAL_TABLET | Freq: Once | ORAL | Status: AC
  Administered 2021-05-30: 50 mg via ORAL
  Filled 2021-05-30: qty 1

## 2021-05-30 MED ORDER — PANTOPRAZOLE SODIUM 40 MG PO TBEC: 40.0000 mg | DELAYED_RELEASE_TABLET | Freq: Every day | ORAL | 0 refills | Status: DC

## 2021-05-30 MED ORDER — CARBAMAZEPINE 100 MG PO CHEW: 100.0000 mg | CHEWABLE_TABLET | Freq: Three times a day (TID) | ORAL | 0 refills | Status: DC

## 2021-05-30 NOTE — Progress Notes (Signed)
Recreation Therapy Notes  Animal-Assisted Activity (AAA) Program Checklist/Progress Notes Patient Eligibility Criteria Checklist & Daily Group note for Rec Tx Intervention  Date: 6.7.22 Time: 1430 Location: [address]   AAA/T Program Assumption of Risk Form signed by Engineer, production or Parent Legal Guardian  YES   Patient is free of allergies or severe asthma  YES  Patient reports no fear of animals  YES  Patient reports no history of cruelty to animals YES   Patient understands his/her participation is voluntary  YES   Patient washes hands before animal contact  YES   Patient washes hands after animal contact YES  Education: Charity fundraiser, Appropriate Animal Interaction   Education Outcome: Acknowledges understanding/In group clarification offered/Needs additional education.   Clinical Observations/Feedback: Pt did not attend group activity.    Caroll Rancher, LRT/CTRS         Caroll Rancher A 05/30/2021 3:33 PM
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
## 2021-05-30 NOTE — Progress Notes (Signed)
RN met with pt and reviewed pt's discharge instructions.  Pt verbalized understanding of discharge instructions and pt did not have any questions. RN reviewed and provided pt with a copy of SRA, AVS and Transition Record.  RN returned pt's belongings to pt. Pt denied SI/HI/AVH and voiced no concerns.  Pt was appreciative of the care pt received at [facility].  Patient discharged to the lobby without incident.

## 2021-05-30 NOTE — Progress Notes (Signed)
  [facility] Adult Case Management Discharge Plan :  Will you be returning to the same living situation after discharge:  Yes,  Home  At discharge, do you have transportation home?: Yes,  Mother  Do you have the ability to pay for your medications: Yes,  Family   Release of information consent forms completed and in the chart;  Patient's signature needed at discharge.  Patient to Follow up at:  Follow-up Information    [facility] Follow up.   Specialty: Behavioral Health Why: You have an appointment for medication management and therapy on 05/31/21 at 7:45am. These appointments will be held in person. You may ask for substance use treatment services at this facility.  Contact information: [address] [place] [address] [phone]              Next level of care provider has access to [facility] Link:yes  Safety Planning and Suicide Prevention discussed: Yes,  with patient and mother   Have you used any form of tobacco in the last 30 days? (Cigarettes, Smokeless Tobacco, Cigars, and/or Pipes): Yes  Has patient been referred to the Quitline?: Patient refused referral  Patient has been referred for addiction treatment: Pt. refused referral  [name], LCSWA 05/30/2021, 1:49 PM

## 2021-05-30 NOTE — Progress Notes (Addendum)
   05/29/21 2000  Psych Admission Type (Psych Patients Only)  Admission Status Involuntary  Psychosocial Assessment  Patient Complaints Anxiety;Depression  Eye Contact Fair  Facial Expression Anxious  Affect Appropriate to circumstance;Anxious  Speech Logical/coherent  Interaction Assertive  Appearance/Hygiene Unremarkable  Behavior Characteristics Cooperative;Anxious  Mood Anxious  Thought Process  Coherency WDL  Content WDL  Delusions None reported or observed  Perception WDL  Hallucination None reported or observed  Judgment WDL  Confusion None  Danger to Self  Current suicidal ideation? Denies  Danger to Others  Danger to Others None reported or observed   Pt seen in dayroom interacting with peers. Pt denies SI, HI, AVH and pain. Pt rates anxiety 5/10 and depression 2/10. "I about had a panic attack earlier because no one seemed to know what was going on and they were confusing me. I was told I would leave today and then someone else said I wasn't leaving and someone said they were calling my mom." Pt says he believes he is leaving tomorrow morning and he is ready. "Today I wasn't ready to go but then I was told that I had to go or they would call the police to escort me off the property so I said ok. Next thing, they decided not to discharge me. I kinda lost it after that." Pt says he has made some friends here and doesn't feel so alone.

## 2021-05-30 NOTE — [facility] Suicide Risk Assessment (Deleted)
[facility] Discharge Suicide Risk Assessment   Principal Problem: Depression Discharge Diagnoses: Principal Problem:   Depression Active Problems:   GAD (generalized anxiety disorder)   Alcohol use disorder, severe, dependence (HCC)   Total Time spent with patient: 38 minutes   On evaluation at discharge, patient reports that prior to admission he made a "cry for attention, as he was feeling ignored."  Patient states that he recognizes that everybody in his family is grieving in their own ways, and is hopeful that he can get individual therapy as well as family therapy.  Patient lives with his mother who is disabled whom he has a primary caregiver for.  Patient also states he has a 62-year-old son who lives with son's mother that he currently does not have contact with.  He states that he feels motivated to "get sober" so that he can spend time with his son again.  Patient admits that there is a gun in the house that he uses to shoot the raccoons that get into their garbage cans.  He states that all weapons have been secured into a safe that he will not have access to.  This has been confirmed by social work.  Patient is denying any suicidal or homicidal ideation.  He denies any auditory or visual hallucinations.  He is not currently having cravings for alcohol, but recognizes that this is still a struggle for him.  He is agreeable to outpatient treatment programs.  Zoloft is increased to 100 mg today, and patient will follow up with outpatient psychiatry for further medication management.  He is able to contract for safety at discharge.  Musculoskeletal: Strength & Muscle Tone: within normal limits Gait & Station: normal Patient leans: N/A  Psychiatric Specialty Exam  Presentation  General Appearance: Appropriate for Environment; Casual  Eye Contact:Good  Speech:Normal Rate  Speech Volume:Normal  Handedness:Right   Mood and Affect  Mood:Euthymic  Duration of Depression Symptoms: Greater  than two weeks  Affect:Appropriate   Thought Process  Thought Processes:Coherent  Descriptions of Associations:Intact  Orientation:Full (Time, Place and Person)  Thought Content:Logical  History of Schizophrenia/Schizoaffective disorder:No  Duration of Psychotic Symptoms:No data recorded Hallucinations:Hallucinations: None  Ideas of Reference:None  Suicidal Thoughts:Suicidal Thoughts: No  Homicidal Thoughts:Homicidal Thoughts: No   Sensorium  Memory:Immediate Fair; Recent Fair; Remote Fair  Judgment:Fair  Insight:Lacking   Executive Functions  Concentration:Good  Attention Span:Good  Recall:Fair  Fund of Knowledge:Good  Language:Good   Psychomotor Activity  Psychomotor Activity:Psychomotor Activity: Normal   Assets  Assets:Desire for Improvement; Resilience; Housing   Sleep  Sleep:Sleep: Fair Number of Hours of Sleep: 5.5   Physical Exam: Physical Exam ROS Blood pressure 104/74, pulse 82, temperature 97.7 F (36.5 C), temperature source Oral, resp. rate 16, height 6' (1.829 m), weight 76.7 kg, SpO2 100 %. Body mass index is 22.92 kg/m.  Mental Status Per Nursing Assessment::   On Admission:  NA  Demographic Factors:  Male, Adolescent or young adult, Caucasian, Unemployed and Access to firearms  Loss Factors: Decrease in vocational status and financial stress  Historical Factors: Prior suicide attempts, Family history of mental illness or substance abuse and Impulsivity  Risk Reduction Factors:   Sense of responsibility to family, Religious beliefs about death, Living with another person, especially a relative, Positive social support, Positive therapeutic relationship and Positive coping skills or problem solving skills  Continued Clinical Symptoms:  Depression:   Impulsivity Alcohol/Substance Abuse/Dependencies  Cognitive Features That Contribute To Risk:  None    Suicide Risk:  Minimal: No identifiable suicidal ideation.   Patients presenting with no risk factors but with morbid ruminations; may be classified as minimal risk based on the severity of the depressive symptoms   Follow-up Information    [facility] Follow up.   Specialty: Behavioral Health Why: You have an appointment for medication management and therapy on 05/31/21 at 7:45am. These appointments will be held in person. You may ask for substance use treatment services at this facility.  Contact information: [address] [place] [address] [phone]              Plan Of Care/Follow-up recommendations:  Activity:  ad lib Diet:  aas tolerated   On day of discharge following sustained improvement in the affect of this patient, continued report of euthymic mood, repeated denial of suicidal, homicidal, and other violent ideation, adequate interaction with peers, active participation in groups while on the unit, and denial of adverse reactions from medications, the treatment team decided John Vaughan was stable for discharge home with scheduled mental health treatment as noted above.  He was able to engage in safety planning including plan to return to [facility] or contact emergency services if he feels unable to maintain his own safety or the safety of others. Patient had no further questions, comments, or concerns. Discharge into care of his mother, who agrees to maintain patient safety.  Patient aware to return to nearest crisis center, ED or to call 911 for worsening symptoms of depression, suicidal or homicidal thoughts or AVH.     Mariel Craft, MD 05/30/2021, 1:45 PM

## 2021-05-30 NOTE — [facility] Group Notes (Signed)
[facility] Group Notes:  (Nursing/MHT/Case Management/Adjunct)  Date:  05/30/2021  Time:  9:44 AM  Type of Therapy:  Goals group  Participation Level:  Active  Participation Quality:  Attentive and Sharing  Affect:  Appropriate  Cognitive:  Alert  Insight:  Improving  Engagement in Group:  Engaged  Modes of Intervention:  Discussion and Education  Summary of Progress/Problems:  John Vaughan reporting sleeping well with bedtime medication.  His goal for today is "go home, don't drink or do drugs and take my medication."  [name] 05/30/2021, 9:44 AM

## 2021-05-30 NOTE — Discharge Summary (Signed)
Physician Discharge Summary Note  Patient:  John Vaughan is an 30 y.o., male MRN:  [id number] DOB:  1991-02-17 Patient phone:  [phone] (home)  Patient address:   [address] [place] [address],  Total Time spent with patient: 30 minutes  Date of Admission:  05/24/2021 Date of Discharge: 05/30/2021  Reason for Admission:  (From MD's admission note): Patient is a 30 year old male with a past psychiatric history significant for alcohol dependence, cocaine use disorder, history of alcohol withdrawal related seizures who originally presented to the [facility] emergency department on 05/23/2021. He was evaluated by the comprehensive clinical assessment team. He had been brought to the behavioral health urgent care center by the sheriff's department due to his mother calling 911 because the patient had been destructive in his home, was holding a rifle to his head, and also attempted to cut himself. The patient stated that he had been depressed since approximately 2020/11/30. He stated his father died for unknown cardiac reasons at that time. The patient admitted that he had been cutting himself recently, and felt as though he was unable to talk to anyone. He also got a DUI approximately 3 weeks prior to medicines. He was taking lorazepam, Librium as well as clozapine. That had been prescribed by [facility]. He denied suicidal or homicidal ideation. There is no evidence of psychosis at that time. He was transferred to the emergency department under involuntary commitment. He was seen by psychiatric consultation on 05/24/2021. At that time he stated that "I told my mom and to call the police". He told me this morning that his mother had called the police because she was concerned for possible suicidality. He stated that he was trying to "clean his gun". The patient admitted to a longstanding history of alcoholism. He stated he drank  between 12-24 beers a day. He admitted to a longstanding history of alcohol dependence. He did admit to the consult service that he felt as though his life was hitting a dead end. He denied any previous suicide attempts or self-harm he denied any interest in going to a rehabilitation facility. He was transferred to our facility for evaluation and stabilization. Review of the electronic medical record revealed several hospitalizations and ER visits secondary to alcohol withdrawal syndromes. He was admitted to the [facility] on 03/20/2021 secondary to alcohol withdrawal. At that time he had been drinking between 24-30 beers a day, but had been cutting back and was only having 1-2 beers a day recently. He began feeling weak and developing a fever. He had mild diarrhea as well. He was admitted for medical detoxification. His discharge medications included Librium for taper as well as clonazepam 0.5 mg p.o. twice daily as needed. He was also taking gabapentin as well as pantoprazole. He had been prescribed sertraline 50 mg p.o. daily for depression. His last psychiatric hospitalization was in December 2021. He was admitted to the [facility] in [place]. He admitted at that time that he had had alcohol use disorder problems since age 16. He stated he had been sober for approximately 2 months but then relapsed. He reported a history of withdrawal seizures as well as delirium tremens. He had been medically admitted between 12/11-12/15 for alcohol withdrawal and a blood alcohol at that time that was 418. He was admitted for detoxification at that time. He was on the Librium taper and that was completed. His discharge medications at that  time included sertraline 100 mg p.o. daily, vitamin B12, gabapentin, multivitamin, Protonix. He did not follow-up psychiatrically. He presented on 04/12/2021 to an outpatient clinic. He was prescribed oral naltrexone. His  blood alcohol on admission was 279. Drug screen was positive for cocaine as well.  Evaluation on the unit: Patient was seen and evaluated. Patient denies SI/HI/AVH, paranoia and delusions. He is sleeping and eating well. He is taking his medications and has no issues with them. He is attending group therapy and interacting with staff and peers appropriately. He has follow up appointments as listed in his discharge paperwork. He has been provided with samples of his medications, he has no insurance. He was also provided with paper prescriptions to have filled after he discharges. He will be returning home with his family, his mother will be picking him up today. His vital signs are stable, he is afebrile. Patient is stable for discharge home today.    Principal Problem: Depression Discharge Diagnoses: Principal Problem:   Depression Active Problems:   GAD (generalized anxiety disorder)   Alcohol use disorder, severe, dependence (HCC)   Past Psychiatric History: See H&P  Past Medical History:  Past Medical History:  Diagnosis Date  . Alcohol abuse   . Anxiety   . Depression    History reviewed. No pertinent surgical history. Family History:  Family History  Problem Relation Age of Onset  . Anxiety disorder Mother   . Alcohol abuse Father   . Anxiety disorder Maternal Aunt   . Anxiety disorder Maternal Grandmother   . Alcohol abuse Paternal Grandfather    Family Psychiatric  History: See H&P Social History:  Social History   Substance and Sexual Activity  Alcohol Use Yes  . Alcohol/week: 18.0 standard drinks  . Types: 18 Cans of beer per week   Comment: states that he drinks 2-4 beers daily     Social History   Substance and Sexual Activity  Drug Use Not Currently  . Types: Marijuana, Cocaine    Social History   Socioeconomic History  . Marital status: Single    Spouse name: Not on file  . Number of children: Not on file  . Years of education: Not on file  .  Highest education level: Not on file  Occupational History  . Not on file  Tobacco Use  . Smoking status: Current Every Day Smoker    Packs/day: 1.00    Types: Cigarettes  . Smokeless tobacco: Former Neurosurgeon    Types: Snuff  Vaping Use  . Vaping Use: Never used  Substance and Sexual Activity  . Alcohol use: Yes    Alcohol/week: 18.0 standard drinks    Types: 18 Cans of beer per week    Comment: states that he drinks 2-4 beers daily  . Drug use: Not Currently    Types: Marijuana, Cocaine  . Sexual activity: Not Currently  Other Topics Concern  . Not on file  Social History Narrative  . Not on file   Social Determinants of Health   Financial Resource Strain: Not on file  Food Insecurity: Not on file  Transportation Needs: Not on file  Physical Activity: Not on file  Stress: Not on file  Social Connections: Not on file    Hospital Course:  After the above admission evaluation, Hurman's presenting symptoms were noted. He was recommended for mood stabilization treatments. The medication regimen targeting those presenting symptoms were discussed with him & initiated with his consent. He was started on tegretol and Zoloft  for his mood and depression. He was started on gabapentin for alcohol withdrawal syndrome. His UDS on arrival to the ED was positive for cocaine, his BAL was 279.  however, he did not develop any alcohol withdrawal symptoms & did not receive alcohol detoxification treatments. He was however medicated, stabilized & discharged on the medications as listed on his discharge medication list below. Besides the mood stabilization treatments, Nickey was also enrolled & participated in the group counseling sessions being offered & held on this unit. He learned coping skills. He presented no other significant pre-existing medical issues that required treatment. He tolerated his treatment regimen without any adverse effects or reactions reported.   During the course of his  hospitalization, the 15-minute checks were adequate to ensure patient's safety. Norwin did not display any dangerous, violent or suicidal behavior on the unit.  He interacted with patients & staff appropriately, participated appropriately in the group sessions/therapies. His medications were addressed & adjusted to meet his/her needs. He was recommended for outpatient follow-up care & medication management upon discharge to assure continuity of care & mood stability.  At the time of discharge patient is not reporting any acute suicidal/homicidal ideations. He feels more confident about his self-care & in managing his mental health. He currently denies any new issues or concerns. Education and supportive counseling provided throughout his hospital stay & upon discharge.   Today upon his discharge evaluation with the attending psychiatrist, Rabon shares he is doing well. He denies any other specific concerns. He is sleeping well. His appetite is good. He/ denies other physical complaints. He denies AH/VH, delusional thoughts or paranoia. He does not appear to be responding to any internal stimuli. He feels that his medications have been helpful & is in agreement to continue his current treatment regimen as recommended. He was able to engage in safety planning including plan to return to [facility] or contact emergency services if he feels unable to maintain his own safety or the safety of others. Pt had no further questions, comments, or concerns. He left [facility] with all personal belongings in no apparent distress. Transportation per [employer].   Physical Findings: AIMS:  , ,  ,  ,    CIWA:  CIWA-Ar Total: 2 COWS:     Musculoskeletal: Strength & Muscle Tone: within normal limits Gait & Station: normal Patient leans: N/A   Psychiatric Specialty Exam:  Presentation  General Appearance: Appropriate for Environment; Casual  Eye Contact:Good  Speech:Normal Rate  Speech  Volume:Normal  Handedness:Right  Mood and Affect  Mood:Euthymic  Affect:Appropriate  Thought Process  Thought Processes:Coherent  Descriptions of Associations:Intact  Orientation:Full (Time, Place and Person)  Thought Content:Logical  History of Schizophrenia/Schizoaffective disorder:No  Duration of Psychotic Symptoms:No data recorded Hallucinations:Hallucinations: None  Ideas of Reference:None  Suicidal Thoughts:Suicidal Thoughts: No  Homicidal Thoughts:Homicidal Thoughts: No  Sensorium  Memory:Immediate Fair; Recent Fair; Remote Fair  Judgment:Fair  Insight:Lacking  Executive Functions  Concentration:Good  Attention Span:Good  Recall:Fair  Fund of Knowledge:Good  Language:Good  Psychomotor Activity  Psychomotor Activity:Psychomotor Activity: Normal  Assets  Assets:Desire for Improvement; Resilience; Housing  Sleep  Sleep:Sleep: Fair Number of Hours of Sleep: 5.5  Physical Exam: Physical Exam Vitals and nursing note reviewed.  Constitutional:      Appearance: Normal appearance.  HENT:     Head: Normocephalic.  Pulmonary:     Effort: Pulmonary effort is normal.  Musculoskeletal:        General: Normal range of motion.     Cervical  back: Normal range of motion.  Neurological:     General: No focal deficit present.     Mental Status: He is alert and oriented to person, place, and time.  Psychiatric:        Attention and Perception: Attention normal. He does not perceive auditory or visual hallucinations.        Mood and Affect: Mood normal.        Speech: Speech normal.        Behavior: Behavior normal. Behavior is cooperative.        Thought Content: Thought content normal. Thought content is not paranoid or delusional. Thought content does not include homicidal or suicidal ideation. Thought content does not include homicidal or suicidal plan.        Cognition and Memory: Cognition normal.    Review of Systems  Constitutional: Negative  for fever.  HENT: Negative for congestion, sinus pain and sore throat.   Respiratory: Negative for cough, shortness of breath and wheezing.   Cardiovascular: Negative.   Gastrointestinal: Negative.   Genitourinary: Negative.   Musculoskeletal: Negative.   Neurological: Negative.    Blood pressure 104/74, pulse 82, temperature 97.7 F (36.5 C), temperature source Oral, resp. rate 16, height 6' (1.829 m), weight 76.7 kg, SpO2 100 %. Body mass index is 22.92 kg/m.   Have you used any form of tobacco in the last 30 days? (Cigarettes, Smokeless Tobacco, Cigars, and/or Pipes): Yes  Has this patient used any form of tobacco in the last 30 days? (Cigarettes, Smokeless Tobacco, Cigars, and/or Pipes) Yes, N/A  Blood Alcohol level:  Lab Results  Component Value Date   ETH 279 (H) 05/24/2021   ETH 148 (H) 09/05/2020    Metabolic Disorder Labs:  No results found for: HGBA1C, MPG No results found for: PROLACTIN No results found for: CHOL, TRIG, HDL, CHOLHDL, VLDL, LDLCALC  See Psychiatric Specialty Exam and Suicide Risk Assessment completed by Attending Physician prior to discharge.  Discharge destination:  Home  Is patient on multiple antipsychotic therapies at discharge:  No   Has Patient had three or more failed trials of antipsychotic monotherapy by history:  No  Recommended Plan for Multiple Antipsychotic Therapies: NA   Allergies as of 05/30/2021   No Known Allergies     Medication List    STOP taking these medications   chlordiazePOXIDE 25 MG capsule Commonly known as: LIBRIUM   clonazePAM 0.5 MG tablet Commonly known as: KLONOPIN   naltrexone 50 MG tablet Commonly known as: DEPADE   ondansetron 4 MG tablet Commonly known as: ZOFRAN     TAKE these medications     Indication  carbamazepine 100 MG chewable tablet Commonly known as: TEGRETOL Chew 1 tablet (100 mg total) by mouth 3 (three) times daily.  Indication: seizure prevention   gabapentin 400 MG  capsule Commonly known as: NEURONTIN Take 1 capsule (400 mg total) by mouth 3 (three) times daily. What changed:   medication strength  how much to take  Indication: Abuse or Misuse of Alcohol   hydrOXYzine 50 MG tablet Commonly known as: ATARAX/VISTARIL Take 1 tablet (50 mg total) by mouth 3 (three) times daily as needed for anxiety. What changed:   medication strength  how much to take  reasons to take this  Indication: Feeling Anxious   Multivitamin Adults Tabs Take 1 tablet by mouth daily.  Indication: vitamin supplement   pantoprazole 40 MG tablet Commonly known as: PROTONIX Take 1 tablet (40 mg total) by mouth daily.  Start taking on: May 31, 2021 What changed:   when to take this  reasons to take this  Indication: Heartburn   sertraline 50 MG tablet Commonly known as: ZOLOFT Take 2 tablets (100 mg total) by mouth daily. What changed: how much to take  Indication: Generalized Anxiety Disorder, Major Depressive Disorder   traZODone 100 MG tablet Commonly known as: DESYREL Take 1 tablet (100 mg total) by mouth at bedtime as needed for sleep.  Indication: Trouble Sleeping       Follow-up Information    [facility].   Specialty: Behavioral Health Why: You have an appointment for medication management and therapy on 05/31/21 at 7:45am. These appointments will be held in person. You may ask for substance use treatment services at this facility.  Contact information: [address]  [place] [phone]              Follow-up recommendations:  Activity:  as tolerated Diet:  Heart healthy  Comments:  Prescriptions were given at discharge.  Patient is agreeable to plan.  He was given the opportunity to ask questions.  He appears to feel comfortable with discharge and denies any current suicidal or homicidal thoughts.   Patient is instructed prior to discharge to: Take all medications as  prescribed by his mental healthcare provider. Report any adverse effects and or reactions from the medicines to his outpatient provider promptly. Patient has been instructed & cautioned: To not engage in alcohol and or illegal drug use while on prescription medicines. In the event of worsening symptoms, patient is instructed to call the crisis hotline, 911 and or go to the nearest ED for appropriate evaluation and treatment of symptoms. To follow-up with his primary care provider for your other medical issues, concerns and or health care needs.  Signed: Laveda AbbeLaurie Britton Hattie Aguinaldo, NP 05/30/2021, 5:57 PM

## 2021-06-05 ENCOUNTER — Encounter (HOSPITAL_COMMUNITY): Payer: Self-pay | Admitting: Emergency Medicine

## 2021-06-05 ENCOUNTER — Emergency Department (HOSPITAL_COMMUNITY)
Admission: EM | Admit: 2021-06-05 | Discharge: 2021-06-06 | Disposition: A | Discharge status: Other Acute Inpatient Hospital | Payer: Federal, State, Local not specified - Other | Attending: Emergency Medicine | Admitting: Emergency Medicine

## 2021-06-05 ENCOUNTER — Other Ambulatory Visit: Payer: Self-pay

## 2021-06-05 DIAGNOSIS — Z20822 Contact with and (suspected) exposure to covid-19: Secondary | ICD-10-CM | POA: Insufficient documentation

## 2021-06-05 DIAGNOSIS — X789XXA Intentional self-harm by unspecified sharp object, initial encounter: Secondary | ICD-10-CM | POA: Insufficient documentation

## 2021-06-05 DIAGNOSIS — F1020 Alcohol dependence, uncomplicated: Secondary | ICD-10-CM | POA: Insufficient documentation

## 2021-06-05 DIAGNOSIS — R45851 Suicidal ideations: Secondary | ICD-10-CM

## 2021-06-05 DIAGNOSIS — Z7289 Other problems related to lifestyle: Secondary | ICD-10-CM

## 2021-06-05 DIAGNOSIS — F332 Major depressive disorder, recurrent severe without psychotic features: Secondary | ICD-10-CM | POA: Insufficient documentation

## 2021-06-05 DIAGNOSIS — Z23 Encounter for immunization: Secondary | ICD-10-CM | POA: Insufficient documentation

## 2021-06-05 DIAGNOSIS — Y908 Blood alcohol level of 240 mg/100 ml or more: Secondary | ICD-10-CM | POA: Insufficient documentation

## 2021-06-05 DIAGNOSIS — F17210 Nicotine dependence, cigarettes, uncomplicated: Secondary | ICD-10-CM | POA: Insufficient documentation

## 2021-06-05 DIAGNOSIS — S51812A Laceration without foreign body of left forearm, initial encounter: Secondary | ICD-10-CM | POA: Insufficient documentation

## 2021-06-05 LAB — CBC WITH DIFFERENTIAL/PLATELET
Abs Immature Granulocytes: 0.02 10*3/uL (ref 0.00–0.07)
Basophils Absolute: 0.1 10*3/uL (ref 0.0–0.1)
Basophils Relative: 2 %
Eosinophils Absolute: 1 10*3/uL — ABNORMAL HIGH (ref 0.0–0.5)
Eosinophils Relative: 15 %
HCT: 40.9 % (ref 39.0–52.0)
Hemoglobin: 14.3 g/dL (ref 13.0–17.0)
Immature Granulocytes: 0 %
Lymphocytes Relative: 22 %
Lymphs Abs: 1.4 10*3/uL (ref 0.7–4.0)
MCH: 34.5 pg — ABNORMAL HIGH (ref 26.0–34.0)
MCHC: 35 g/dL (ref 30.0–36.0)
MCV: 98.8 fL (ref 80.0–100.0)
Monocytes Absolute: 0.8 10*3/uL (ref 0.1–1.0)
Monocytes Relative: 12 %
Neutro Abs: 3.2 10*3/uL (ref 1.7–7.7)
Neutrophils Relative %: 49 %
Platelets: 354 10*3/uL (ref 150–400)
RBC: 4.14 MIL/uL — ABNORMAL LOW (ref 4.22–5.81)
RDW: 12.4 % (ref 11.5–15.5)
WBC: 6.6 10*3/uL (ref 4.0–10.5)
nRBC: 0 % (ref 0.0–0.2)

## 2021-06-05 LAB — RAPID URINE DRUG SCREEN, HOSP PERFORMED
Amphetamines: NOT DETECTED
Barbiturates: NOT DETECTED
Benzodiazepines: POSITIVE — AB
Cocaine: POSITIVE — AB
Opiates: NOT DETECTED
Tetrahydrocannabinol: NOT DETECTED

## 2021-06-05 LAB — COMPREHENSIVE METABOLIC PANEL
ALT: 17 U/L (ref 0–44)
AST: 22 U/L (ref 15–41)
Albumin: 4.2 g/dL (ref 3.5–5.0)
Alkaline Phosphatase: 44 U/L (ref 38–126)
Anion gap: 13 (ref 5–15)
BUN: 6 mg/dL (ref 6–20)
CO2: 19 mmol/L — ABNORMAL LOW (ref 22–32)
Calcium: 9.1 mg/dL (ref 8.9–10.3)
Chloride: 111 mmol/L (ref 98–111)
Creatinine, Ser: 0.86 mg/dL (ref 0.61–1.24)
GFR, Estimated: >60 mL/min (ref >60)
Glucose, Bld: 94 mg/dL (ref 70–99)
Potassium: 3.6 mmol/L (ref 3.5–5.1)
Sodium: 143 mmol/L (ref 135–145)
Total Bilirubin: 0.5 mg/dL (ref 0.3–1.2)
Total Protein: 7.9 g/dL (ref 6.5–8.1)

## 2021-06-05 LAB — RESP PANEL BY RT-PCR (FLU A&B, COVID) ARPGX2
Influenza A by PCR: NEGATIVE
Influenza B by PCR: NEGATIVE
SARS Coronavirus 2 by RT PCR: NEGATIVE

## 2021-06-05 LAB — ETHANOL: Alcohol, Ethyl (B): 261 mg/dL — ABNORMAL HIGH (ref <10)

## 2021-06-05 MED ORDER — RISPERIDONE 1 MG PO TBDP: 2.0000 mg | ORAL_TABLET | Freq: Three times a day (TID) | ORAL | Status: DC | PRN

## 2021-06-05 MED ORDER — ZOLPIDEM TARTRATE 5 MG PO TABS: 5.0000 mg | ORAL_TABLET | Freq: Every evening | ORAL | Status: DC | PRN

## 2021-06-05 MED ORDER — NICOTINE 21 MG/24HR TD PT24
21.0000 mg | MEDICATED_PATCH | Freq: Every day | TRANSDERMAL | Status: DC
  Administered 2021-06-05: 21 mg via TRANSDERMAL
  Filled 2021-06-05 (×2): qty 1

## 2021-06-05 MED ORDER — TETANUS-DIPHTH-ACELL PERTUSSIS 5-2.5-18.5 LF-MCG/0.5 IM SUSY
0.5000 mL | PREFILLED_SYRINGE | Freq: Once | INTRAMUSCULAR | Status: AC
  Administered 2021-06-05: 0.5 mL via INTRAMUSCULAR
  Filled 2021-06-05: qty 0.5

## 2021-06-05 MED ORDER — ONDANSETRON HCL 4 MG PO TABS: 4.0000 mg | ORAL_TABLET | Freq: Three times a day (TID) | ORAL | Status: DC | PRN

## 2021-06-05 MED ORDER — ALUM & MAG HYDROXIDE-SIMETH 200-200-20 MG/5ML PO SUSP: 30.0000 mL | Freq: Four times a day (QID) | ORAL | Status: DC | PRN

## 2021-06-05 MED ORDER — LORAZEPAM 1 MG PO TABS: 1.0000 mg | ORAL_TABLET | ORAL | Status: DC | PRN

## 2021-06-05 MED ORDER — ACETAMINOPHEN 325 MG PO TABS: 650.0000 mg | ORAL_TABLET | ORAL | Status: DC | PRN

## 2021-06-05 MED ORDER — ZIPRASIDONE MESYLATE 20 MG IM SOLR
20.0000 mg | INTRAMUSCULAR | Status: AC | PRN
  Administered 2021-06-05: 20 mg via INTRAMUSCULAR
  Filled 2021-06-05: qty 20

## 2021-06-05 MED ORDER — STERILE WATER FOR INJECTION IJ SOLN
INTRAMUSCULAR | Status: AC
  Filled 2021-06-05: qty 10

## 2021-06-05 NOTE — ED Notes (Signed)
Pt out of restraints. Pt changed into purple scrubs, pt belongings placed in one bag and placed in nurses cabinet above 16-22. Pt ambulated to restroom without assistance. Pt provided a urine sample. Pt returned to room and given dinner tray. Pt is calm and cooperative at this time.

## 2021-06-05 NOTE — ED Notes (Signed)
Pt provided with ice water
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
## 2021-06-05 NOTE — ED Notes (Signed)
Pt feed ham sandwich and given sips of water by this tech. Pt allowed condom cath to be placed in order to obtain urine sample. Pt resting at this time.

## 2021-06-05 NOTE — ED Notes (Signed)
Call from attorney who stated John Vaughan has a court date in the AM  Pt was asleep and John Vaughan states will return call in the AM

## 2021-06-05 NOTE — ED Triage Notes (Signed)
BIBA and [facility] : Pt coming in for SI and slit wrist. Pt experiencing hallucinations, drug abuse (ETOH and Cocaine) and is very tearful.

## 2021-06-05 NOTE — ED Notes (Signed)
Pt to room 29. Pt alert, calm. Cooperative, no s/s of stress. Pt oriented to

## 2021-06-05 NOTE — ED Provider Notes (Addendum)
[facility] Provider Note   CSN: [id number] Arrival date & time: 06/05/21  1221     History Chief Complaint  Patient presents with   Suicidal    John Vaughan is a 30 y.o. male.  The history is provided by the patient and medical records. No language interpreter was used.   30 year old male significant history of alcohol abuse, depression, history of suicidal ideation brought here via EMS accompanied by [facility] for homicidal and suicidal ideation.  Per [facility], patient's mother have reached out because the patient was threatened to harm her as well as patient cutting on himself.  Patient was aggressive and emotionally labile towards EMS and [facility] requiring restraint.  At this time patient admits that he is having a lot of stress and admits to self injures behavior.  He denies wanting to harm his mom.  He report he was just recently released from behavioral health yesterday.  He admits that he is self-medicating with drugs and alcohol.  Last use was last night.  When asked if he having auditory visual hallucination.  Initially the patient declined was then admits that he does but does not go into detail.  He is unsure his last tetanus status.  Past Medical History:  Diagnosis Date   Alcohol abuse    Anxiety    Depression     Patient Active Problem List   Diagnosis Date Noted   Suicidal ideations 05/24/2021   MDD (major depressive disorder), recurrent severe, without psychosis (HCC) 05/24/2021   Depression 03/20/2021   Alcohol withdrawal (HCC) 09/06/2020   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 (HCC) 04/20/2020   Homicidal ideation    GAD (generalized anxiety disorder) 03/09/2019   Alcohol use disorder, severe, dependence (HCC) 03/09/2019   Mild episode of recurrent major depressive disorder (HCC) 03/09/2019    No past surgical history on file.     Family History  Problem Relation Age of Onset   Anxiety disorder  Mother    Alcohol abuse Father    Anxiety disorder Maternal Aunt    Anxiety disorder Maternal Grandmother    Alcohol abuse Paternal Grandfather     Social History   Tobacco Use   Smoking status: Every Day    Packs/day: 1.00    Pack years: 0.00    Types: Cigarettes   Smokeless tobacco: Former    Types: Snuff  Vaping Use   Vaping Use: Never used  Substance Use Topics   Alcohol use: Yes    Alcohol/week: 18.0 standard drinks    Types: 18 Cans of beer per week    Comment: states that he drinks 2-4 beers daily   Drug use: Not Currently    Types: Marijuana, Cocaine    Home Medications Prior to Admission medications   Medication Sig Start Date End Date Taking? Authorizing Provider  carbamazepine (TEGRETOL) 100 MG chewable tablet Chew 1 tablet (100 mg total) by mouth 3 (three) times daily. 05/30/21   Mariel Craft, MD  gabapentin (NEURONTIN) 400 MG capsule Take 1 capsule (400 mg total) by mouth 3 (three) times daily. 05/30/21   Mariel Craft, MD  hydrOXYzine (ATARAX/VISTARIL) 50 MG tablet Take 1 tablet (50 mg total) by mouth 3 (three) times daily as needed for anxiety. 05/30/21   Mariel Craft, MD  Multiple Vitamins-Minerals (MULTIVITAMIN ADULTS) TABS Take 1 tablet by mouth daily. 05/30/21   Mariel Craft, MD  pantoprazole (PROTONIX) 40 MG tablet Take 1 tablet (40 mg total) by  mouth daily. 05/31/21   Mariel Craft, MD  sertraline (ZOLOFT) 50 MG tablet Take 2 tablets (100 mg total) by mouth daily. 05/30/21   Mariel Craft, MD  traZODone (DESYREL) 100 MG tablet Take 1 tablet (100 mg total) by mouth at bedtime as needed for sleep. 05/30/21   Mariel Craft, MD    Allergies    Patient has no known allergies.  Review of Systems   Review of Systems  All other systems reviewed and are negative.  Physical Exam Updated Vital Signs BP 95/74   Pulse (!) 109   Temp 97.7 F (36.5 C) (Oral)   Resp 15   Ht 6' (1.829 m)   Wt 70.3 kg   SpO2 96%   BMI 21.02 kg/m   Physical  Exam Vitals and nursing note reviewed.  Constitutional:      Appearance: He is well-developed.     Comments: Patient is tearful, emotionally labile  HENT:     Head: Atraumatic.  Eyes:     Conjunctiva/sclera: Conjunctivae normal.  Cardiovascular:     Rate and Rhythm: Normal rate and regular rhythm.     Pulses: Normal pulses.     Heart sounds: Normal heart sounds.  Pulmonary:     Effort: Pulmonary effort is normal.     Breath sounds: Normal breath sounds.  Abdominal:     Palpations: Abdomen is soft.     Tenderness: There is no abdominal tenderness.  Musculoskeletal:     Cervical back: Neck supple.  Skin:    Findings: No rash.     Comments: Left forearm: 3 superficial lacerations noted to forearm not actively bleeding and no signs of infection or foreign body noted  Neurological:     Mental Status: He is alert.  Psychiatric:        Attention and Perception: Attention normal.        Mood and Affect: Affect is labile.        Speech: Speech is delayed.        Behavior: Behavior is uncooperative.        Thought Content: Thought content includes suicidal ideation. Thought content does not include homicidal ideation.    ED Results / Procedures / Treatments   Labs (all labs ordered are listed, but only abnormal results are displayed) Labs Reviewed  CBC WITH DIFFERENTIAL/PLATELET - Abnormal; Notable for the following components:      Result Value   RBC 4.14 (*)    MCH 34.5 (*)    Eosinophils Absolute 1.0 (*)    All other components within normal limits  RESP PANEL BY RT-PCR (FLU A&B, COVID) [id number]  COMPREHENSIVE METABOLIC PANEL  ETHANOL  RAPID URINE DRUG SCREEN, HOSP PERFORMED    EKG None  Radiology No results found.  Procedures Procedures   Medications Ordered in ED Medications  risperiDONE (RISPERDAL M-TABS) disintegrating tablet 2 mg (has no administration in time range)    And  LORazepam (ATIVAN) tablet 1 mg (has no administration in time range)    And   ziprasidone (GEODON) injection 20 mg (20 mg Intramuscular Given 06/05/21 1325)  acetaminophen (TYLENOL) tablet 650 mg (has no administration in time range)  zolpidem (AMBIEN) tablet 5 mg (has no administration in time range)  ondansetron (ZOFRAN) tablet 4 mg (has no administration in time range)  alum & mag hydroxide-simeth (MAALOX/MYLANTA) 200-200-20 MG/5ML suspension 30 mL (has no administration in time range)  nicotine (NICODERM CQ - dosed in mg/24 hours) patch 21 mg (21 mg  Transdermal Patch Applied 06/05/21 1317)  sterile water (preservative free) injection (has no administration in time range)  Tdap (BOOSTRIX) injection 0.5 mL (0.5 mLs Intramuscular Given 06/05/21 1358)    ED Course  I have reviewed the triage vital signs and the nursing notes.  Pertinent labs & imaging results that were available during my care of the patient were reviewed by me and considered in my medical decision making (see chart for details).    MDM Rules/Calculators/A&P                          BP 95/74   Pulse (!) 109   Temp 97.7 F (36.5 C) (Oral)   Resp 15   Ht 6' (1.829 m)   Wt 70.3 kg   SpO2 96%   BMI 21.02 kg/m   Final Clinical Impression(s) / ED Diagnoses Final diagnoses:  Suicidal ideation  Self-injurious behavior    Rx / DC Orders ED Discharge Orders     None      12:55 PM Patient with history of psychiatric illness brought in accompanied by [facility] and EMS due to homicidal ideation as well as suicidal ideation and self-harm including cutting onto his left forearm.  He has 3 shallow laceration which does not require suture repair.  Will update tetanus.  Patient required physical restraint due to his initial aggressive behavior.  At this time patient is calm and cooperative.  Work-up initiated.  3:34 PM Although elevated alcohol of 261, patient is medically cleared and can be assessed further by psychiatry.  CMP have not resulted yet.  Pt sign out to oncoming team who will f/u on CMP  result. [name] paper filed  The patient has been placed in psychiatric observation due to the need to provide a safe environment for the patient while obtaining psychiatric consultation and evaluation, as well as ongoing medical and medication management to treat the patient's condition.  The patient has been placed under full [name] at this time.    Fayrene Helper, PA-C 06/05/21 1535    Fayrene Helper, PA-C 06/05/21 1541    Fayrene Helper, PA-C 06/06/21 1610    Lorre Nick, MD 06/08/21 2251

## 2021-06-05 NOTE — ED Notes (Signed)
Pt became extremely agitated pulling off pulse oximeter and trying to get out of restraints. This RN informed pt he needed to keep equipment on and pt began cussing at this RN and trying to rip off restraints. Pt spitting on staff and cursing at staff. PRN geodon given. Attempted to draw blood, but with patient agitation and movement, blood draw unsuccessful

## 2021-06-06 ENCOUNTER — Other Ambulatory Visit: Payer: Self-pay

## 2021-06-06 ENCOUNTER — Inpatient Hospital Stay (HOSPITAL_COMMUNITY)
Admission: AD | Admit: 2021-06-06 | Discharge: 2021-06-11 | DRG: 885 | Disposition: A | Discharge status: Home / Self Care | Payer: No Typology Code available for payment source | Source: Intra-hospital | Attending: Psychiatry | Admitting: Psychiatry

## 2021-06-06 ENCOUNTER — Encounter (HOSPITAL_COMMUNITY): Payer: Self-pay | Admitting: Psychiatry

## 2021-06-06 DIAGNOSIS — F1020 Alcohol dependence, uncomplicated: Secondary | ICD-10-CM | POA: Diagnosis present

## 2021-06-06 DIAGNOSIS — Z811 Family history of alcohol abuse and dependence: Secondary | ICD-10-CM

## 2021-06-06 DIAGNOSIS — R45851 Suicidal ideations: Secondary | ICD-10-CM | POA: Diagnosis present

## 2021-06-06 DIAGNOSIS — G4700 Insomnia, unspecified: Secondary | ICD-10-CM | POA: Diagnosis present

## 2021-06-06 DIAGNOSIS — R45850 Homicidal ideations: Secondary | ICD-10-CM | POA: Diagnosis present

## 2021-06-06 DIAGNOSIS — F17210 Nicotine dependence, cigarettes, uncomplicated: Secondary | ICD-10-CM | POA: Diagnosis present

## 2021-06-06 DIAGNOSIS — F332 Major depressive disorder, recurrent severe without psychotic features: Secondary | ICD-10-CM | POA: Diagnosis present

## 2021-06-06 DIAGNOSIS — F32A Depression, unspecified: Secondary | ICD-10-CM

## 2021-06-06 DIAGNOSIS — Z79899 Other long term (current) drug therapy: Secondary | ICD-10-CM

## 2021-06-06 MED ORDER — HYDROXYZINE HCL 50 MG PO TABS
50.0000 mg | ORAL_TABLET | Freq: Three times a day (TID) | ORAL | Status: DC | PRN
Start: 2021-06-06 — End: 2021-06-11
  Administered 2021-06-06 – 2021-06-11 (×9): 50 mg via ORAL
  Filled 2021-06-06 (×3): qty 1
  Filled 2021-06-06: qty 10
  Filled 2021-06-06 (×6): qty 1

## 2021-06-06 MED ORDER — LORAZEPAM 1 MG PO TABS: 1.0000 mg | ORAL_TABLET | ORAL | Status: DC | PRN

## 2021-06-06 MED ORDER — MAGNESIUM HYDROXIDE 400 MG/5ML PO SUSP: 30.0000 mL | Freq: Every day | ORAL | Status: DC | PRN

## 2021-06-06 MED ORDER — TRAZODONE HCL 100 MG PO TABS
100.0000 mg | ORAL_TABLET | Freq: Every evening | ORAL | Status: DC | PRN
  Administered 2021-06-06 – 2021-06-10 (×5): 100 mg via ORAL
  Filled 2021-06-06 (×4): qty 1
  Filled 2021-06-06: qty 7
  Filled 2021-06-06: qty 1

## 2021-06-06 MED ORDER — THIAMINE HCL 100 MG/ML IJ SOLN: 100.0000 mg | Freq: Once | INTRAMUSCULAR | Status: DC

## 2021-06-06 MED ORDER — THIAMINE HCL 100 MG PO TABS
100.0000 mg | ORAL_TABLET | Freq: Every day | ORAL | Status: DC
  Administered 2021-06-06 – 2021-06-11 (×6): 100 mg via ORAL
  Filled 2021-06-06 (×9): qty 1

## 2021-06-06 MED ORDER — RISPERIDONE 2 MG PO TBDP: 2.0000 mg | ORAL_TABLET | Freq: Three times a day (TID) | ORAL | Status: DC | PRN

## 2021-06-06 MED ORDER — LORAZEPAM 1 MG PO TABS
1.0000 mg | ORAL_TABLET | Freq: Four times a day (QID) | ORAL | Status: DC | PRN
  Administered 2021-06-07: 1 mg via ORAL
  Filled 2021-06-06: qty 1

## 2021-06-06 MED ORDER — ZIPRASIDONE MESYLATE 20 MG IM SOLR: 20.0000 mg | Freq: Four times a day (QID) | INTRAMUSCULAR | Status: DC | PRN

## 2021-06-06 MED ORDER — NICOTINE 21 MG/24HR TD PT24
21.0000 mg | MEDICATED_PATCH | Freq: Every day | TRANSDERMAL | Status: DC
  Administered 2021-06-07 – 2021-06-11 (×5): 21 mg via TRANSDERMAL
  Filled 2021-06-06 (×7): qty 1

## 2021-06-06 MED ORDER — SERTRALINE HCL 100 MG PO TABS
100.0000 mg | ORAL_TABLET | Freq: Every day | ORAL | Status: DC
Start: 2021-06-06 — End: 2021-06-11
  Administered 2021-06-06 – 2021-06-11 (×6): 100 mg via ORAL
  Filled 2021-06-06: qty 7
  Filled 2021-06-06 (×2): qty 1
  Filled 2021-06-06: qty 7
  Filled 2021-06-06 (×4): qty 1
  Filled 2021-06-06: qty 2

## 2021-06-06 MED ORDER — CARBAMAZEPINE 100 MG PO CHEW
100.0000 mg | CHEWABLE_TABLET | Freq: Three times a day (TID) | ORAL | Status: DC
  Administered 2021-06-06 – 2021-06-11 (×13): 100 mg via ORAL
  Filled 2021-06-06 (×2): qty 1
  Filled 2021-06-06 (×2): qty 21
  Filled 2021-06-06 (×6): qty 1
  Filled 2021-06-06: qty 21
  Filled 2021-06-06 (×3): qty 1
  Filled 2021-06-06 (×2): qty 21
  Filled 2021-06-06 (×4): qty 1
  Filled 2021-06-06: qty 21

## 2021-06-06 MED ORDER — ONDANSETRON HCL 4 MG PO TABS
4.0000 mg | ORAL_TABLET | Freq: Three times a day (TID) | ORAL | Status: DC | PRN
  Administered 2021-06-07: 4 mg via ORAL
  Filled 2021-06-06: qty 1

## 2021-06-06 MED ORDER — CHLORDIAZEPOXIDE HCL 25 MG PO CAPS
25.0000 mg | ORAL_CAPSULE | Freq: Four times a day (QID) | ORAL | Status: DC | PRN
  Administered 2021-06-07 – 2021-06-09 (×2): 25 mg via ORAL
  Filled 2021-06-06 (×3): qty 1

## 2021-06-06 MED ORDER — NICOTINE 14 MG/24HR TD PT24: 14.0000 mg | MEDICATED_PATCH | Freq: Every day | TRANSDERMAL | Status: DC

## 2021-06-06 MED ORDER — FOLIC ACID 1 MG PO TABS
1.0000 mg | ORAL_TABLET | Freq: Every day | ORAL | Status: DC
  Administered 2021-06-06 – 2021-06-11 (×6): 1 mg via ORAL
  Filled 2021-06-06 (×9): qty 1

## 2021-06-06 NOTE — Progress Notes (Addendum)
   06/06/21 2100  Psych Admission Type (Psych Patients Only)  Admission Status Voluntary  Psychosocial Assessment  Patient Complaints Anxiety  Eye Contact Fair  Facial Expression Animated;Anxious;Sullen;Sad;Worried  Affect Anxious;Depressed;Sad;Sullen  Speech Logical/coherent  Interaction Assertive  Motor Activity Restless  Appearance/Hygiene Disheveled;In scrubs  Behavior Characteristics Cooperative;Anxious  Mood Depressed;Anxious;Pleasant  Thought Process  Coherency WDL  Content Blaming others  Delusions None reported or observed  Perception WDL  Hallucination None reported or observed  Judgment Poor  Confusion None  Danger to Self  Current suicidal ideation? Denies  Danger to Others  Danger to Others None reported or observed   Pt denies SI, HI, AVH and pain. Pt rates anxiety 6/10. Pt says that he cut his arm because he fell out of a tree trying to cut a low-hanging branch. "I passed out when I hit the ground." Pt says it wasn't a suicide attempt. "I'm afraid of heights." Pt said he had to hear from his mother what happened because he knocked himself out. Pt denies any suicide attempt. Pt still drinking alcohol however. BAL = 261. Pt denies drinking at time of incident. Pt denies any withdrawal symptoms besides tingling in legs. Pt hopes his stay will not be long this time. "I can't get anyone to believe me that this was an accident and not intentional. I guess the good thing is that people are checking on me more and concerned about me."
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
## 2021-06-06 NOTE — ED Notes (Signed)
Patient awake receiving breakfast tray

## 2021-06-06 NOTE — BH Assessment (Addendum)
Patient meets criteria for inpatient psychiatric treatment upon chart review from TTS staff  Pollyann Glen).   Per [facility] AC Everardo Pacific, RN), no beds available. [facility] reported that patient should be faxed out.   Patient was faxed out the following facilities for consideration of bed placement.    [facility]  --  [facility]  --  [facility]  --  [facility]  --  [facility]  --  [facility]  --  [facility]  --  [facility]  --  [facility]  --  [facility]  --  [facility]  --  [facility]  --  [facility]  --  [facility]  --  [facility]  --  [facility]

## 2021-06-06 NOTE — BH Assessment (Signed)
BHH Assessment Progress Note   Per Caryn Bee, NP, this pt requires psychiatric hospitalization at this time.  Linsey, RN, [facility] has assigned pt to [facility] Rm 505-2 to the service of Landry Mellow, MD.  [facility] will be ready to receive pt at 13:30.  Pt has signed Voluntary Admission and Consent for Treatment, as well as Consent to Release Information to his mother, and signed forms have been faxed to [facility].  EDP Mancel Bale, MD and pt's nurse, Addison Naegeli, have been notified, and Addison Naegeli agrees to send original paperwork along with pt via [facility], and to call report to [phone].  Doylene Canning, [place] Behavioral Health Coordinator [phone]
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
## 2021-06-06 NOTE — Progress Notes (Addendum)
06/06/2021  1308 Called Safe transport to transport patient to [facility]. Per [facility] it can be about 1 hour wait.

## 2021-06-06 NOTE — ED Provider Notes (Addendum)
Emergency Medicine Observation Re-evaluation Note  John Vaughan is a 30 y.o. male, seen on rounds today.  Pt initially presented to the ED for complaints of Suicidal Currently, the patient is resting comfortably.  Physical Exam  BP 99/67 (BP Location: Right Arm)   Pulse 70   Temp 98.3 F (36.8 C) (Oral)   Resp 16   Ht 6' (1.829 m)   Wt 70.3 kg   SpO2 97%   BMI 21.02 kg/m  Physical Exam General: Nontoxic appearing Cardiac: Normal heart rate Lungs: Normal respiratory Psych: Calm  ED Course / MDM  EKG:   I have reviewed the labs performed to date as well as medications administered while in observation.  Recent changes in the last 24 hours include plan for psychiatric admission.  Plan  Current plan is for psychiatric admission. Patient is not under full [name] at this time.   Mancel Bale, MD 06/06/21 [phone]  He has now been accepted to the [facility] for treatment.    Mancel Bale, MD 06/06/21 1240

## 2021-06-06 NOTE — Tx Team (Signed)
Initial Treatment Plan 06/06/2021 3:54 PM [name] [id number]    PATIENT STRESSORS: Financial difficulties Health problems Medication change or noncompliance Substance abuse Traumatic event   PATIENT STRENGTHS: Ability for insight Active sense of humor Capable of independent living Physical Health Supportive family/friends   PATIENT IDENTIFIED PROBLEMS: anxiety  depression                   DISCHARGE CRITERIA:  Ability to meet basic life and health needs Improved stabilization in mood, thinking, and/or behavior Motivation to continue treatment in a less acute level of care Need for constant or close observation no longer present  PRELIMINARY DISCHARGE PLAN: Attend aftercare/continuing care group Outpatient therapy Return to previous living arrangement  PATIENT/FAMILY INVOLVEMENT: This treatment plan has been presented to and reviewed with the patient, John Vaughan.  The patient and family have been given the opportunity to ask questions and make suggestions.  Raylene Miyamoto, RN 06/06/2021, 3:54 PM

## 2021-06-06 NOTE — ED Notes (Signed)
Patient asleep rise and fall of chest breathing observed 

## 2021-06-06 NOTE — Progress Notes (Signed)
06/06/2021  1306  Michael from [facility] called to get report. Report given to Casimiro Needle.

## 2021-06-07 DIAGNOSIS — F332 Major depressive disorder, recurrent severe without psychotic features: Principal | ICD-10-CM

## 2021-06-07 MED ORDER — ONDANSETRON HCL 4 MG PO TABS
8.0000 mg | ORAL_TABLET | Freq: Four times a day (QID) | ORAL | Status: DC | PRN
  Administered 2021-06-07: 8 mg via ORAL
  Filled 2021-06-07: qty 2

## 2021-06-07 NOTE — Progress Notes (Signed)
Pt received PRN Zofran, Vistaril and Ativan (See EMAR) as ordered this shift for anxiety and nausea without emesis. Noted in bed after breakfast, reported feeling sick on his stomach rates his anxiety 8/10, depression 2/10 and hopelessness 0/10 with goal to "get my medications straight". Bilateral hand tremors noted when offered water and gingerale. Pt did not eat his lunch due to unrelieved nausea but was able to eat dinner, got out of bed to dayroom, brightened up this evening in comparison to being guarded and isolative in bed majority of this shift since breakfast. Denies SI, HI, AVH and pain. Reported speaking to his mom this evening and being remorseful and apologetic to her "I'm very ashamed of what I did this time to get here. That's not me, my mom told me everything".  Support and encouragement offered to pt. All medications given as ordered with verbal education and effects monitored. Safety checks maintained at Q 15 minutes intervals without self harm gestures. Assigned psychiatrist notified of unrelieved nausea and changes were made to pt's Zofran dose.  Pt in agreement with changes to his medication regimen. Pt tolerates dinner and all his medications well without discomfort. Cooperative with unit routines and remains redirectable in milieu.

## 2021-06-07 NOTE — [facility] Counselor (Signed)
Adult Comprehensive Assessment   Patient ID: John Vaughan, male   DOB: 12-26-1990, 30 y.o.   MRN: [id number]   Information Source: Information source: Patient   Current Stressors:  Patient states their primary concerns and needs for treatment are:: "Had an accident. I blacked out and woke up in the hospital. They thought I did it on purpose" Patient states their goals for this hospitilization and ongoing recovery are:: "To keep taking medicine" Educational / Learning stressors: No stress. 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 Is in the process of applying for disability income. Family Relationships: Denies Human resources officer / Lack of resources (include bankruptcy): Yes, with trying to keep up with bills Housing / Lack of housing: No stress Physical health (include injuries & life threatening diseases): Arm being cut from chainsaw Social relationships: No stress Substance abuse: Denies stressor Bereavement / Loss: "A lot of friends, a lot of family. Last one was my dad, he died in 2023-11-28 unexpectedly. Drinking increased as a result"   Living/Environment/Situation:  Living Arrangements: Parent Living conditions (as described by patient or guardian): "It's completely stressful; Needs met, we've got food, water, power" Who else lives in the home?: Mother. How long has patient lived in current situation?: "My whole life" What is atmosphere in current home: Other (Comment) ("Stressful; Everybody's still grieving")   Family History:  Marital status: Single Are you sexually active?: No What is your sexual orientation?: "Girls" Has your sexual activity been affected by drugs, alcohol, medication, or emotional stress?: "Yes" Lack of desire. Does patient have children?: Yes How many children?: 1 How is patient's relationship with their children?: Pt states he hasn't seen his 75 1/2 year old son in 2 years   Childhood History:  By whom  was/is the patient raised?: Both parents Additional childhood history information: "Other than figuring out what struggling is, it was great" Description of patient's relationship with caregiver when they were a child: "It was definitely good, I was a wild child but we still got along" Patient's description of current relationship with people who raised him/her: "Pt's father died in 05-Nov-2020 and he states he's been "completely depressed" since that time. I'm the only one that will take care of mom." How were you disciplined when you got in trouble as a child/adolescent?: "Spankings, grounding, normal [place] way" Does patient have siblings?: Yes Number of Siblings: 2 Description of patient's current relationship with siblings: "Both older, decent relationship, they've got their own family's so they do their own things" Did patient suffer any verbal/emotional/physical/sexual abuse as a child?: No Did patient suffer from severe childhood neglect?: No Has patient ever been sexually abused/assaulted/raped as an adolescent or adult?: No Was the patient ever a victim of a crime or a disaster?: No Witnessed domestic violence?: No Has patient been affected by domestic violence as an adult?: No   Education:  Highest grade of school patient has completed: Graduated high school Currently a student?: No Learning disability?: Yes What learning problems does patient have?: ADHD   Employment/Work Situation:   Employment situation: Unemployed Patient's job has been impacted by current illness: Yes Describe how patient's job has been impacted: Liability for potential employeers due to alcohol use. What is the longest time patient has a held a job?: 10 years Where was the patient employed at that time?: [employer] Has patient ever been in the [employer]?: No   Financial Resources:   Museum/gallery curator  resources: Food stamps,Income from employment (odd jobs) Does patient have a  Programmer, applications or guardian?: No   Alcohol/Substance Abuse:   What has been your use of drugs/alcohol within the last 12 months?: "Daily drinking, used to be 40-ish 12oz beers a day, cut back to 12 12oz beers a day within the last couple months. Cocaine use, 2-3x in the last month" If attempted suicide, did drugs/alcohol play a role in this?: No Alcohol/Substance Abuse Treatment Hx: Past detox If yes, describe treatment: "Just detox, tried AA once but didn't like it" Has alcohol/substance abuse ever caused legal problems?: Yes (One DUI, a month ago.)   Social Support System:   Patient's Community Support System: Good Describe Community Support System: "My mom and my brothers" Type of faith/religion: Darrick Meigs How does patient's faith help to cope with current illness?: "A lot, pray"   Leisure/Recreation:   Do You Have Hobbies?: Yes Leisure and Hobbies: Pt reported working on cars, Dietitian, go for runs.   Strengths/Needs:   What is the patient's perception of their strengths?: "Quick learner and really good with my hands" Patient states they can use these personal strengths during their treatment to contribute to their recovery: "Helps me staying busy"   Discharge Plan:   Currently receiving community mental health services: No Patient states concerns and preferences for aftercare planning are: Prefer linked to outpatient and medication management through [facility]. Not interested in residential or [facility]. Patient states they will know when they are safe and ready for discharge when: Feels ready now Does patient have access to transportation?: Yes Does patient have financial barriers related to discharge medications?: Yes Patient description of barriers related to discharge medications: No insurance, inconsistent income Will patient be returning to same living situation after discharge?: Yes   Summary/Recommendations:   Summary and Recommendations (to be completed by the  evaluator): John Vaughan was admitted to [facility] with a past hx of alcohol dependence and cocaine use disorder. Pt presented to the ED after he was trimming a tree with a chainsaw when he states he blacked out and woke up in the hospital with his arm cut. 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 Pt currently sees no outpatient providers. While here, John Vaughan can benefit from crisis stabilization, medication management, therapeutic milieu, and referrals for services.

## 2021-06-07 NOTE — Progress Notes (Signed)
Adult Psychoeducational Group Note  Date:  06/07/2021 Time:  10:22 PM  Group Topic/Focus:  Wrap-Up Group:   The focus of this group is to help patients review their daily goal of treatment and discuss progress on daily workbooks.  Participation Level:  Minimal  Participation Quality:  Appropriate  Affect:  Appropriate  Cognitive:  Appropriate  Insight: Appropriate  Engagement in Group:  Engaged  Modes of Intervention:  Education  Additional Comments:  Patient attended and participated in group tonight. He reports that the most significant thing for him to day was talking to his mother.  Lita Mains [place] 06/07/2021, 10:22 PM

## 2021-06-07 NOTE — H&P (Signed)
Psychiatric Admission Assessment Adult  Patient Identification: John Vaughan MRN:  [id number] Date of Evaluation:  06/07/2021 Chief Complaint:  MDD (major depressive disorder), recurrent episode, severe (HCC) [F33.2] Principal Diagnosis: <principal problem not specified> Diagnosis:  Active Problems:   MDD (major depressive disorder), recurrent episode, severe (HCC)  History of Present Illness: Patient is seen and examined.  Patient is a 30 year old male familiar to me from a previous psychiatric hospitalization approximately 2 weeks ago who presented to the [facility] emergency department on 06/05/2021 secondary to suicidal ideation and alcohol abuse.  The patient had been brought to the [facility] emergency department by EMS by [facility] police.  This was reportedly for homicidal and suicidal ideation.  Per the [facility] police the patient's mother had reached out because the patient was threatening to harm her as well as patient cutting on himself.  The patient was aggressive and emotionally labile towards EMS as well as [facility] police.  They required restraint.  He admitted to having a great deal of stress and admitted to self-injurious behavior at that time.  He denied wanting to harm his mother.  He admitted that he was self-medicating with drugs and alcohol.  He stated his last use was the night prior to admission.  He was seen by the clinical comprehensive team, and the patient admitted to self-injurious behavior with a skill saw.  He reported multiple attempts to harm himself in the past including his ongoing issues with substance abuse.  He reported the last use of substances was approximately 12 hours prior to the evaluation.  He stated at that time "I was just high".  The patient had a recent admission to the hospital on 05/25/2021.  His mother involuntarily committed him at that time as well.  He reportedly had put a gun to his head.  In the  initial part of the evaluation at that time he stated that he was "just cleaning the gun, it is a big misunderstanding".  Later in the hospitalization he admitted to thinking about harming himself.  On evaluation in the unit today he stated that the injury to his arm was "an accident" and denied suicidal ideation once again.  His blood alcohol on admission was 261, drug screen was positive for benzodiazepines as well as cocaine.  During the course of his first hospitalization here he admitted that he had prescriptions for clonazepam at home, and intended to take those.  He was admitted to the hospital for evaluation and stabilization.  Associated Signs/Symptoms: Depression Symptoms:  depressed mood, anhedonia, insomnia, suicidal thoughts with specific plan, Duration of Depression Symptoms: Greater than two weeks  (Hypo) Manic Symptoms:  Impulsivity, Irritable Mood, Labiality of Mood, Anxiety Symptoms:  Excessive Worry, Psychotic Symptoms:   Denied PTSD Symptoms: Negative Total Time spent with patient: 30 minutes  Past Psychiatric History: Patient was recently hospitalized at our facility for similar circumstances.  He has a longstanding history of alcohol dependence.  This started at age 7.  His last admission was secondary to suicidal ideation as well as substance abuse.  Is the patient at risk to self? Yes.    Has the patient been a risk to self in the past 6 months? Yes.    Has the patient been a risk to self within the distant past? No.  Is the patient a risk to others? No.  Has the patient been a risk to others in the past 6 months? No.  Has the patient been a  risk to others within the distant past? No.   Prior Inpatient Therapy:   Prior Outpatient Therapy:    Alcohol Screening: 1. How often do you have a drink containing alcohol?: 4 or more times a week 2. How many drinks containing alcohol do you have on a typical day when you are drinking?: 1 or 2 3. How often do you have six  or more drinks on one occasion?: Never AUDIT-C Score: 4 4. How often during the last year have you found that you were not able to stop drinking once you had started?: Never 5. How often during the last year have you failed to do what was normally expected from you because of drinking?: Never 6. How often during the last year have you needed a first drink in the morning to get yourself going after a heavy drinking session?: Never 7. How often during the last year have you had a feeling of guilt of remorse after drinking?: Never 8. How often during the last year have you been unable to remember what happened the night before because you had been drinking?: Never 9. Have you or someone else been injured as a result of your drinking?: No 10. Has a relative or friend or a doctor or another health worker been concerned about your drinking or suggested you cut down?: No Alcohol Use Disorder Identification Test Final Score (AUDIT): 4 Alcohol Brief Interventions/Follow-up: Alcohol education/Brief advice Substance Abuse History in the last 12 months:  Yes.   Consequences of Substance Abuse: Medical Consequences:    Clearly contributed to the last 2 hospitalizations Previous Psychotropic Medications: Yes  Psychological Evaluations: Yes  Past Medical History:  Past Medical History:  Diagnosis Date   Alcohol abuse    Anxiety    Depression    History reviewed. No pertinent surgical history. Family History:  Family History  Problem Relation Age of Onset   Anxiety disorder Mother    Alcohol abuse Father    Anxiety disorder Maternal Aunt    Anxiety disorder Maternal Grandmother    Alcohol abuse Paternal Grandfather    Family Psychiatric  History: See previous H&P Tobacco Screening: Have you used any form of tobacco in the last 30 days? (Cigarettes, Smokeless Tobacco, Cigars, and/or Pipes): Yes Tobacco use, Select all that apply: 5 or more cigarettes per day Are you interested in Tobacco Cessation  Medications?: Yes, will notify MD for an order Counseled patient on smoking cessation including recognizing danger situations, developing coping skills and basic information about quitting provided: Yes Social History:  Social History   Substance and Sexual Activity  Alcohol Use Yes   Alcohol/week: 2.0 standard drinks   Types: 2 Cans of beer per week   Comment: states that he drinks 2 beers daily     Social History   Substance and Sexual Activity  Drug Use Not Currently   Types: Marijuana, Cocaine   Comment: cocaine in last month    Additional Social History:                           Allergies:  No Known Allergies Lab Results: No results found for this or any previous visit (from the past 48 hour(s)).  Blood Alcohol level:  Lab Results  Component Value Date   ETH 261 (H) 06/05/2021   ETH 279 (H) 05/24/2021    Metabolic Disorder Labs:  No results found for: HGBA1C, MPG No results found for: PROLACTIN No results found for:  CHOL, TRIG, HDL, CHOLHDL, VLDL, LDLCALC  Current Medications: Current Facility-Administered Medications  Medication Dose Route Frequency Provider Last Rate Last Admin   carbamazepine (TEGRETOL) chewable tablet 100 mg  100 mg Oral TID Maryagnes Amos, FNP   100 mg at 06/07/21 1300   chlordiazePOXIDE (LIBRIUM) capsule 25 mg  25 mg Oral Q6H PRN Antonieta Pert, MD       folic acid (FOLVITE) tablet 1 mg  1 mg Oral Daily Antonieta Pert, MD   1 mg at 06/07/21 1287   hydrOXYzine (ATARAX/VISTARIL) tablet 50 mg  50 mg Oral TID PRN Maryagnes Amos, FNP   50 mg at 06/07/21 0826   risperiDONE (RISPERDAL M-TABS) disintegrating tablet 2 mg  2 mg Oral Q8H PRN Antonieta Pert, MD       And   LORazepam (ATIVAN) tablet 1 mg  1 mg Oral Q6H PRN Antonieta Pert, MD       And   ziprasidone (GEODON) injection 20 mg  20 mg Intramuscular Q6H PRN Antonieta Pert, MD       magnesium hydroxide (MILK OF MAGNESIA) suspension 30 mL  30 mL  Oral Daily PRN Starkes-Perry, Juel Burrow, FNP       nicotine (NICODERM CQ - dosed in mg/24 hours) patch 21 mg  21 mg Transdermal Daily Maryagnes Amos, FNP   21 mg at 06/07/21 0824   ondansetron (ZOFRAN) tablet 8 mg  8 mg Oral Q6H PRN Antonieta Pert, MD       sertraline (ZOLOFT) tablet 100 mg  100 mg Oral Daily Maryagnes Amos, FNP   100 mg at 06/07/21 8676   thiamine (B-1) injection 100 mg  100 mg Intramuscular Once Antonieta Pert, MD       thiamine tablet 100 mg  100 mg Oral Daily Antonieta Pert, MD   100 mg at 06/07/21 7209   traZODone (DESYREL) tablet 100 mg  100 mg Oral QHS PRN Maryagnes Amos, FNP   100 mg at 06/06/21 2123   PTA Medications: Medications Prior to Admission  Medication Sig Dispense Refill Last Dose   bismuth subsalicylate (PEPTO BISMOL) 262 MG/15ML suspension Take 30 mLs by mouth every 6 (six) hours as needed for indigestion or diarrhea or loose stools.      carbamazepine (TEGRETOL) 100 MG chewable tablet Chew 1 tablet (100 mg total) by mouth 3 (three) times daily. 60 tablet 0    gabapentin (NEURONTIN) 400 MG capsule Take 1 capsule (400 mg total) by mouth 3 (three) times daily. 90 capsule 0    hydrOXYzine (ATARAX/VISTARIL) 50 MG tablet Take 1 tablet (50 mg total) by mouth 3 (three) times daily as needed for anxiety. 30 tablet 0    Multiple Vitamins-Minerals (MULTIVITAMIN ADULTS) TABS Take 1 tablet by mouth daily. 30 tablet 0    pantoprazole (PROTONIX) 40 MG tablet Take 1 tablet (40 mg total) by mouth daily. 30 tablet 0    sertraline (ZOLOFT) 50 MG tablet Take 2 tablets (100 mg total) by mouth daily. 30 tablet 0    traZODone (DESYREL) 100 MG tablet Take 1 tablet (100 mg total) by mouth at bedtime as needed for sleep. 30 tablet 0     Musculoskeletal: Strength & Muscle Tone: within normal limits Gait & Station: normal Patient leans: N/A            Psychiatric Specialty Exam:  Presentation  General Appearance: Disheveled  Eye  Contact:Fair  Speech:Normal Rate  Speech Volume:Normal  Handedness:Right   Mood and Affect  Mood:Euthymic  Affect:Congruent   Thought Process  Thought Processes:Goal Directed  Duration of Psychotic Symptoms: No data recorded Past Diagnosis of Schizophrenia or Psychoactive disorder: No  Descriptions of Associations:Circumstantial  Orientation:Full (Time, Place and Person)  Thought Content:Logical  Hallucinations:Hallucinations: None  Ideas of Reference:None  Suicidal Thoughts:Suicidal Thoughts: No  Homicidal Thoughts:Homicidal Thoughts: No   Sensorium  Memory:Immediate Poor; Recent Poor; Remote Poor  Judgment:Impaired  Insight:Lacking   Executive Functions  Concentration:Fair  Attention Span:Fair  Recall:Poor  Fund of Knowledge:Fair  Language:Good   Psychomotor Activity  Psychomotor Activity:Psychomotor Activity: Normal   Assets  Assets:Desire for Improvement; Resilience   Sleep  Sleep:Number of Hours of Sleep: 6    Physical Exam: Physical Exam Vitals and nursing note reviewed.  HENT:     Head: Normocephalic and atraumatic.  Pulmonary:     Effort: Pulmonary effort is normal.  Neurological:     General: No focal deficit present.     Mental Status: He is alert and oriented to person, place, and time.   Review of Systems  Constitutional: Negative.   HENT: Negative.    Skin: Negative.   Blood pressure 101/81, pulse 79, temperature 98.7 F (37.1 C), temperature source Oral, resp. rate 18, height 6' (1.829 m), weight 70.3 kg, SpO2 100 %. Body mass index is 21.02 kg/m.  Treatment Plan Summary: Daily contact with patient to assess and evaluate symptoms and progress in treatment, Medication management, and Plan   Patient is seen and examined.  Patient is a 30 year old male with the above-stated past psychiatric history who was admitted once again secondary to his suicide attempt and intoxication with substances.  He will be admitted to  the hospital.  He will be integrated in the milieu.  He will be encouraged to attend groups.  He will be placed on the lorazepam detox protocol.  He was discharged on Zoloft 100 mg p.o. daily, and that will be continued.  He was also discharged on Tegretol 100 mg p.o. 3 times daily as well as trazodone.  Those will be continued as well.  Review of his admission laboratories revealed normal electrolytes including creatinine and liver function enzymes.  His CBC was essentially normal.  Differential was normal.  Tegretol level was from 6/6 and was 7.3.  We will repeat that with previously drawn blood.  TSH was normal at 1.685 from 05/25/2021.  Respiratory panel from 6/13 was negative.  Blood alcohol from 6/13 was 261.  Drug screen was positive for benzodiazepines as well as cocaine.  His EKG from 6/1 showed a normal sinus rhythm with an incomplete right bundle branch block and a normal QTc interval.  His vital signs are stable, he is afebrile.  He slept well last night.  Observation Level/Precautions:  Detox 15 minute checks  Laboratory:  CBC Chemistry Profile  Psychotherapy:    Medications:    Consultations:    Discharge Concerns:    Estimated LOS:  Other:     Physician Treatment Plan for Primary Diagnosis: <principal problem not specified> Long Term Goal(s): Improvement in symptoms so as ready for discharge  Short Term Goals: Ability to identify changes in lifestyle to reduce recurrence of condition will improve, Ability to verbalize feelings will improve, Ability to disclose and discuss suicidal ideas, Ability to demonstrate self-control will improve, Ability to identify and develop effective coping behaviors will improve, Ability to maintain clinical measurements within normal limits will improve, Compliance with prescribed medications will improve, and Ability to  identify triggers associated with substance abuse/mental health issues will improve  Physician Treatment Plan for Secondary Diagnosis:  Active Problems:   MDD (major depressive disorder), recurrent episode, severe (HCC)  Long Term Goal(s): Improvement in symptoms so as ready for discharge  Short Term Goals: Ability to identify changes in lifestyle to reduce recurrence of condition will improve, Ability to verbalize feelings will improve, Ability to disclose and discuss suicidal ideas, Ability to demonstrate self-control will improve, Ability to identify and develop effective coping behaviors will improve, Ability to maintain clinical measurements within normal limits will improve, Compliance with prescribed medications will improve, and Ability to identify triggers associated with substance abuse/mental health issues will improve  I certify that inpatient services furnished can reasonably be expected to improve the patient's condition.    Antonieta Pert, MD 6/15/20224:10 PM

## 2021-06-07 NOTE — [facility] Group Notes (Signed)
The focus of this group is to help patients establish daily goals to achieve during treatment and discuss how the patient can incorporate goal setting into their daily lives to aide in recovery.  Pt did not attend group 

## 2021-06-07 NOTE — [facility] Suicide Risk Assessment (Signed)
[facility] Admission Suicide Risk Assessment   Nursing information obtained from:  Patient Demographic factors:  Male, Caucasian, Unemployed, Adolescent or young adult, Low socioeconomic status Current Mental Status:  NA Loss Factors:  Financial problems / change in socioeconomic status, Decline in physical health Historical Factors:  Impulsivity Risk Reduction Factors:  Sense of responsibility to family, Positive social support, Living with another person, especially a relative, Positive therapeutic relationship  Total Time spent with patient: 30 minutes Principal Problem: <principal problem not specified> Diagnosis:  Active Problems:   MDD (major depressive disorder), recurrent episode, severe (HCC)  Subjective Data: Patient is seen and examined.  Patient is a 30 year old male familiar to me from a previous psychiatric hospitalization approximately 2 weeks ago who presented to the [facility] emergency department on 06/05/2021 secondary to suicidal ideation and alcohol abuse.  The patient had been brought to the [facility] emergency department by EMS by [facility] police.  This was reportedly for homicidal and suicidal ideation.  Per the [facility] police the patient's mother had reached out because the patient was threatening to harm her as well as patient cutting on himself.  The patient was aggressive and emotionally labile towards EMS as well as [facility] police.  They required restraint.  He admitted to having a great deal of stress and admitted to self-injurious behavior at that time.  He denied wanting to harm his mother.  He admitted that he was self-medicating with drugs and alcohol.  He stated his last use was the night prior to admission.  He was seen by the clinical comprehensive team, and the patient admitted to self-injurious behavior with a skill saw.  He reported multiple attempts to harm himself in the past including his ongoing issues with substance  abuse.  He reported the last use of substances was approximately 12 hours prior to the evaluation.  He stated at that time "I was just high".  The patient had a recent admission to the hospital on 05/25/2021.  His mother involuntarily committed him at that time as well.  He reportedly had put a gun to his head.  In the initial part of the evaluation at that time he stated that he was "just cleaning the gun, it is a big misunderstanding".  Later in the hospitalization he admitted to thinking about harming himself.  On evaluation in the unit today he stated that the injury to his arm was "an accident" and denied suicidal ideation once again.  His blood alcohol on admission was 261, drug screen was positive for benzodiazepines as well as cocaine.  During the course of his first hospitalization here he admitted that he had prescriptions for clonazepam at home, and intended to take those.  He was admitted to the hospital for evaluation and stabilization.  Continued Clinical Symptoms:  Alcohol Use Disorder Identification Test Final Score (AUDIT): 4 The "Alcohol Use Disorders Identification Test", Guidelines for Use in Primary Care, Second Edition.  World Science writer [facility]). Score between 0-7:  no or low risk or alcohol related problems. Score between 8-15:  moderate risk of alcohol related problems. Score between 16-19:  high risk of alcohol related problems. Score 20 or above:  warrants further diagnostic evaluation for alcohol dependence and treatment.   CLINICAL FACTORS:   Depression:   Comorbid alcohol abuse/dependence Impulsivity Alcohol/Substance Abuse/Dependencies   Musculoskeletal: Strength & Muscle Tone: within normal limits Gait & Station: normal Patient leans: N/A  Psychiatric Specialty Exam:  Presentation  General Appearance: Disheveled  Eye Contact:Fair  Speech:Normal Rate  Speech Volume:Normal  Handedness:Right   Mood and Affect   Mood:Euthymic  Affect:Congruent   Thought Process  Thought Processes:Goal Directed  Descriptions of Associations:Circumstantial  Orientation:Full (Time, Place and Person)  Thought Content:Logical  History of Schizophrenia/Schizoaffective disorder:No  Duration of Psychotic Symptoms:No data recorded Hallucinations:Hallucinations: None  Ideas of Reference:None  Suicidal Thoughts:Suicidal Thoughts: No  Homicidal Thoughts:Homicidal Thoughts: No   Sensorium  Memory:Immediate Poor; Recent Poor; Remote Poor  Judgment:Impaired  Insight:Lacking   Executive Functions  Concentration:Fair  Attention Span:Fair  Recall:Poor  Fund of Knowledge:Fair  Language:Good   Psychomotor Activity  Psychomotor Activity:Psychomotor Activity: Normal   Assets  Assets:Desire for Improvement; Resilience   Sleep  Sleep:Number of Hours of Sleep: 6    Physical Exam: Physical Exam Vitals and nursing note reviewed.  HENT:     Head: Normocephalic and atraumatic.  Pulmonary:     Effort: Pulmonary effort is normal.  Neurological:     General: No focal deficit present.     Mental Status: He is alert and oriented to person, place, and time.   Review of Systems  All other systems reviewed and are negative. Blood pressure 101/81, pulse 79, temperature (!) 97.4 F (36.3 C), temperature source Oral, resp. rate 18, height 6' (1.829 m), weight 70.3 kg, SpO2 100 %. Body mass index is 21.02 kg/m.   COGNITIVE FEATURES THAT CONTRIBUTE TO RISK:  Thought constriction (tunnel vision)    SUICIDE RISK:   Moderate:  Frequent suicidal ideation with limited intensity, and duration, some specificity in terms of plans, no associated intent, good self-control, limited dysphoria/symptomatology, some risk factors present, and identifiable protective factors, including available and accessible social support.  PLAN OF CARE: Patient is seen and examined.  Patient is a 30 year old male with the  above-stated past psychiatric history who was admitted once again secondary to his suicide attempt and intoxication with substances.  He will be admitted to the hospital.  He will be integrated in the milieu.  He will be encouraged to attend groups.  He will be placed on the lorazepam detox protocol.  He was discharged on Zoloft 100 mg p.o. daily, and that will be continued.  He was also discharged on Tegretol 100 mg p.o. 3 times daily as well as trazodone.  Those will be continued as well.  Review of his admission laboratories revealed normal electrolytes including creatinine and liver function enzymes.  His CBC was essentially normal.  Differential was normal.  Tegretol level was from 6/6 and was 7.3.  We will repeat that with previously drawn blood.  TSH was normal at 1.685 from 05/25/2021.  Respiratory panel from 6/13 was negative.  Blood alcohol from 6/13 was 261.  Drug screen was positive for benzodiazepines as well as cocaine.  His EKG from 6/1 showed a normal sinus rhythm with an incomplete right bundle branch block and a normal QTc interval.  His vital signs are stable, he is afebrile.  He slept well last night.  I certify that inpatient services furnished can reasonably be expected to improve the patient's condition.   Antonieta Pert, MD 06/07/2021, 12:51 PM

## 2021-06-07 NOTE — Progress Notes (Signed)
Recreation Therapy Notes  Date: 6.15.22 Time: 1000 Location: 500 Hall Dayroom/Hallway  Group Topic: Wellness  Goal Area(s) Addresses:  Patient will define components of whole wellness. Patient will verbalize benefit of whole wellness.  Intervention:  Music  Activity: Exercise.  LRT led group in a series of stretches to loosen up the body. Patients were led through stretches that worked the legs, arms sides and hamstrings.  After which, each patient led the group in an exercise of their choosing.  Patients were to try and complete at least 30 minutes of exercise.  Patients were encouraged to take breaks as needed and to drink water.  Education: Wellness, Building control surveyor.   Education Outcome: Acknowledges education/In group clarification offered/Needs additional education.   Clinical Observations/Feedback:  Pt did not attend group session.    Caroll Rancher, LRT/CTRS        Caroll Rancher A 06/07/2021 12:17 PM

## 2021-06-07 NOTE — Tx Team (Signed)
Interdisciplinary Treatment and Diagnostic Plan Update  06/07/2021 Time of Session: 10:25am  John Vaughan MRN: [id number]  Principal Diagnosis: <principal problem not specified>  Secondary Diagnoses: Active Problems:   MDD (major depressive disorder), recurrent episode, severe (HCC)   Current Medications:  Current Facility-Administered Medications  Medication Dose Route Frequency Provider Last Rate Last Admin   carbamazepine (TEGRETOL) chewable tablet 100 mg  100 mg Oral TID Maryagnes Amos, FNP   100 mg at 06/07/21 1300   chlordiazePOXIDE (LIBRIUM) capsule 25 mg  25 mg Oral Q6H PRN Antonieta Pert, MD       folic acid (FOLVITE) tablet 1 mg  1 mg Oral Daily Antonieta Pert, MD   1 mg at 06/07/21 2505   hydrOXYzine (ATARAX/VISTARIL) tablet 50 mg  50 mg Oral TID PRN Maryagnes Amos, FNP   50 mg at 06/07/21 0826   risperiDONE (RISPERDAL M-TABS) disintegrating tablet 2 mg  2 mg Oral Q8H PRN Antonieta Pert, MD       And   LORazepam (ATIVAN) tablet 1 mg  1 mg Oral Q6H PRN Antonieta Pert, MD       And   ziprasidone (GEODON) injection 20 mg  20 mg Intramuscular Q6H PRN Antonieta Pert, MD       magnesium hydroxide (MILK OF MAGNESIA) suspension 30 mL  30 mL Oral Daily PRN Starkes-Perry, Juel Burrow, FNP       nicotine (NICODERM CQ - dosed in mg/24 hours) patch 21 mg  21 mg Transdermal Daily Maryagnes Amos, FNP   21 mg at 06/07/21 0824   ondansetron (ZOFRAN) tablet 8 mg  8 mg Oral Q6H PRN Antonieta Pert, MD       sertraline (ZOLOFT) tablet 100 mg  100 mg Oral Daily Maryagnes Amos, FNP   100 mg at 06/07/21 3976   thiamine (B-1) injection 100 mg  100 mg Intramuscular Once Antonieta Pert, MD       thiamine tablet 100 mg  100 mg Oral Daily Antonieta Pert, MD   100 mg at 06/07/21 7341   traZODone (DESYREL) tablet 100 mg  100 mg Oral QHS PRN Maryagnes Amos, FNP   100 mg at 06/06/21 2123   PTA Medications: Medications Prior to  Admission  Medication Sig Dispense Refill Last Dose   bismuth subsalicylate (PEPTO BISMOL) 262 MG/15ML suspension Take 30 mLs by mouth every 6 (six) hours as needed for indigestion or diarrhea or loose stools.      carbamazepine (TEGRETOL) 100 MG chewable tablet Chew 1 tablet (100 mg total) by mouth 3 (three) times daily. 60 tablet 0    gabapentin (NEURONTIN) 400 MG capsule Take 1 capsule (400 mg total) by mouth 3 (three) times daily. 90 capsule 0    hydrOXYzine (ATARAX/VISTARIL) 50 MG tablet Take 1 tablet (50 mg total) by mouth 3 (three) times daily as needed for anxiety. 30 tablet 0    Multiple Vitamins-Minerals (MULTIVITAMIN ADULTS) TABS Take 1 tablet by mouth daily. 30 tablet 0    pantoprazole (PROTONIX) 40 MG tablet Take 1 tablet (40 mg total) by mouth daily. 30 tablet 0    sertraline (ZOLOFT) 50 MG tablet Take 2 tablets (100 mg total) by mouth daily. 30 tablet 0    traZODone (DESYREL) 100 MG tablet Take 1 tablet (100 mg total) by mouth at bedtime as needed for sleep. 30 tablet 0     Patient Stressors: Financial difficulties Health problems Medication change or noncompliance  Substance abuse Traumatic event  Patient Strengths: Ability for insight Active sense of humor Capable of independent living Physical Health Supportive family/friends  Treatment Modalities: Medication Management, Group therapy, Case management,  1 to 1 session with clinician, Psychoeducation, Recreational therapy.   Physician Treatment Plan for Primary Diagnosis: <principal problem not specified> Long Term Goal(s):     Short Term Goals:    Medication Management: Evaluate patient's response, side effects, and tolerance of medication regimen.  Therapeutic Interventions: 1 to 1 sessions, Unit Group sessions and Medication administration.  Evaluation of Outcomes: Progressing  Physician Treatment Plan for Secondary Diagnosis: Active Problems:   MDD (major depressive disorder), recurrent episode, severe  (HCC)  Long Term Goal(s):     Short Term Goals:       Medication Management: Evaluate patient's response, side effects, and tolerance of medication regimen.  Therapeutic Interventions: 1 to 1 sessions, Unit Group sessions and Medication administration.  Evaluation of Outcomes: Progressing   RN Treatment Plan for Primary Diagnosis: <principal problem not specified> Long Term Goal(s): Knowledge of disease and therapeutic regimen to maintain health will improve  Short Term Goals: Ability to remain free from injury will improve, Ability to participate in decision making will improve, Ability to verbalize feelings will improve, Ability to disclose and discuss suicidal ideas, and Ability to identify and develop effective coping behaviors will improve  Medication Management: RN will administer medications as ordered by provider, will assess and evaluate patient's response and provide education to patient for prescribed medication. RN will report any adverse and/or side effects to prescribing provider.  Therapeutic Interventions: 1 on 1 counseling sessions, Psychoeducation, Medication administration, Evaluate responses to treatment, Monitor vital signs and CBGs as ordered, Perform/monitor CIWA, COWS, AIMS and Fall Risk screenings as ordered, Perform wound care treatments as ordered.  Evaluation of Outcomes: Progressing   LCSW Treatment Plan for Primary Diagnosis: <principal problem not specified> Long Term Goal(s): Safe transition to appropriate next level of care at discharge, Engage patient in therapeutic group addressing interpersonal concerns.  Short Term Goals: Engage patient in aftercare planning with referrals and resources, Increase social support, Facilitate acceptance of mental health diagnosis and concerns, Facilitate patient progression through stages of change regarding substance use diagnoses and concerns, Identify triggers associated with mental health/substance abuse issues, and  Increase skills for wellness and recovery  Therapeutic Interventions: Assess for all discharge needs, 1 to 1 time with Social worker, Explore available resources and support systems, Assess for adequacy in community support network, Educate family and significant other(s) on suicide prevention, Complete Psychosocial Assessment, Interpersonal group therapy.  Evaluation of Outcomes: Progressing   Progress in Treatment: Attending groups: Yes. Participating in groups: Yes. Taking medication as prescribed: Yes. Toleration medication: Yes. Family/Significant other contact made: Yes, individual(s) contacted:  Mother  Patient understands diagnosis: No. Discussing patient identified problems/goals with staff: Yes. Medical problems stabilized or resolved: Yes. Denies suicidal/homicidal ideation: Yes. Issues/concerns per patient self-inventory: No.   New problem(s) identified: No, Describe:  None   New Short Term/Long Term Goal(s): medication stabilization, elimination of SI thoughts, development of comprehensive mental wellness plan.   Patient Goals: "To go home"   Discharge Plan or Barriers: Patient recently admitted. CSW will continue to follow and assess for appropriate referrals and possible discharge planning.   Reason for Continuation of Hospitalization: Anxiety Medication stabilization Suicidal ideation Withdrawal symptoms  Estimated Length of Stay: 3 to 5 days   Attendees: Patient: John Vaughan  06/07/2021   Physician: Landry Mellow, MD 06/07/2021   Nursing:  06/07/2021   RN Care Manager: 06/07/2021   Social Worker: Melba Coon, LCSWA  06/07/2021   Recreational Therapist:  06/07/2021   Other:  06/07/2021   Other:  06/07/2021   Other: 06/07/2021     Scribe for Treatment Team: [name], LCSWA 06/07/2021 3:11 PM

## 2021-06-08 NOTE — Progress Notes (Signed)
   06/07/21 2318  Psych Admission Type (Psych Patients Only)  Admission Status Voluntary  Psychosocial Assessment  Patient Complaints Other (Comment) (some withdrawals)  Eye Contact Fair  Facial Expression Flat  Affect Appropriate to circumstance  Speech Logical/coherent  Interaction Assertive  Appearance/Hygiene Unremarkable  Behavior Characteristics Cooperative;Appropriate to situation  Mood Anxious  Thought Process  Coherency WDL  Content WDL  Delusions WDL  Perception WDL  Hallucination None reported or observed  Judgment WDL  Confusion WDL  Danger to Self  Current suicidal ideation? Denies  Danger to Others  Danger to Others None reported or observed  Patient adamantly denies that the cut on arm is a suicide attempt

## 2021-06-08 NOTE — [facility] Group Notes (Signed)
Adult Psychoeducational Group Note  Date:  06/08/2021 Time:  8:38 PM  Group Topic/Focus:  Building Self Esteem:   The Focus of this group is helping patients become aware of the effects of self-esteem on their lives, the things they and others do that enhance or undermine their self-esteem, seeing the relationship between their level of self-esteem and the choices they make and learning ways to enhance self-esteem.  Participation Level:  Active  Participation Quality:  Appropriate  Affect:  Appropriate  Cognitive:  Alert and Appropriate  Insight: Appropriate  Engagement in Group:  Engaged  Modes of Intervention:  Discussion  Additional Comments  John Vaughan 06/08/2021, 8:38 PM

## 2021-06-08 NOTE — Progress Notes (Signed)
[facility] MD Progress Note  06/08/2021 1:40 PM John Vaughan  MRN:  [id number] Subjective: Patient is a 30 year old male with a past psychiatric history significant for alcohol dependence, substance-induced mood disorder as well as suicidal ideation who was admitted on 06/07/2021 under involuntary commitment after self-injurious behavior.  Objective: Patient is seen and examined.  Patient is a 30 year old male with the above-stated past psychiatric history who is seen in follow-up.  Patient stated that his nausea from yesterday had resolved.  He does not appear to be in any withdrawal this morning.  He has minimal insight towards the alcohol in his life.  He knows how to give the right answers, and we discussed the fact that he had this power tool meant for cutting drywall and would trimming bushes and then self-inflicted injury.  He stated that it was not a self-inflicted injury.  We discussed the seriousness of what occurred especially given his recent admission for similar circumstances.  We once again discussed the possibility of substance rehabilitation facilities, but he declined that again.  He denied suicidal or homicidal ideation.  No evidence of psychosis.  His vital signs are stable, he is afebrile.  No new laboratories.  Principal Problem: <principal problem not specified> Diagnosis: Active Problems:   MDD (major depressive disorder), recurrent episode, severe (HCC)  Total Time spent with patient: 20 minutes  Past Psychiatric History: See admission H&P  Past Medical History:  Past Medical History:  Diagnosis Date   Alcohol abuse    Anxiety    Depression    History reviewed. No pertinent surgical history. Family History:  Family History  Problem Relation Age of Onset   Anxiety disorder Mother    Alcohol abuse Father    Anxiety disorder Maternal Aunt    Anxiety disorder Maternal Grandmother    Alcohol abuse Paternal Grandfather    Family Psychiatric  History: See admission  H&P Social History:  Social History   Substance and Sexual Activity  Alcohol Use Yes   Alcohol/week: 2.0 standard drinks   Types: 2 Cans of beer per week   Comment: states that he drinks 2 beers daily     Social History   Substance and Sexual Activity  Drug Use Not Currently   Types: Marijuana, Cocaine   Comment: cocaine in last month    Social History   Socioeconomic History   Marital status: Single    Spouse name: Not on file   Number of children: Not on file   Years of education: Not on file   Highest education level: Not on file  Occupational History   Not on file  Tobacco Use   Smoking status: Every Day    Packs/day: 1.50    Pack years: 0.00    Types: Cigarettes   Smokeless tobacco: Former    Types: Snuff  Vaping Use   Vaping Use: Never used  Substance and Sexual Activity   Alcohol use: Yes    Alcohol/week: 2.0 standard drinks    Types: 2 Cans of beer per week    Comment: states that he drinks 2 beers daily   Drug use: Not Currently    Types: Marijuana, Cocaine    Comment: cocaine in last month   Sexual activity: Not Currently  Other Topics Concern   Not on file  Social History Narrative   Not on file   Social Determinants of Health   Financial Resource Strain: Not on file  Food Insecurity: Not on file  Transportation Needs: Not on  file  Physical Activity: Not on file  Stress: Not on file  Social Connections: Not on file   Additional Social History:                         Sleep: Good  Appetite:  Good  Current Medications: Current Facility-Administered Medications  Medication Dose Route Frequency Provider Last Rate Last Admin   carbamazepine (TEGRETOL) chewable tablet 100 mg  100 mg Oral TID Maryagnes Amos, FNP   100 mg at 06/08/21 1237   chlordiazePOXIDE (LIBRIUM) capsule 25 mg  25 mg Oral Q6H PRN Antonieta Pert, MD   25 mg at 06/07/21 2113   folic acid (FOLVITE) tablet 1 mg  1 mg Oral Daily Antonieta Pert, MD   1  mg at 06/08/21 0820   hydrOXYzine (ATARAX/VISTARIL) tablet 50 mg  50 mg Oral TID PRN Maryagnes Amos, FNP   50 mg at 06/08/21 4010   risperiDONE (RISPERDAL M-TABS) disintegrating tablet 2 mg  2 mg Oral Q8H PRN Antonieta Pert, MD       And   LORazepam (ATIVAN) tablet 1 mg  1 mg Oral Q6H PRN Antonieta Pert, MD   1 mg at 06/07/21 1654   And   ziprasidone (GEODON) injection 20 mg  20 mg Intramuscular Q6H PRN Antonieta Pert, MD       magnesium hydroxide (MILK OF MAGNESIA) suspension 30 mL  30 mL Oral Daily PRN Starkes-Perry, Juel Burrow, FNP       nicotine (NICODERM CQ - dosed in mg/24 hours) patch 21 mg  21 mg Transdermal Daily Maryagnes Amos, FNP   21 mg at 06/08/21 0821   ondansetron (ZOFRAN) tablet 8 mg  8 mg Oral Q6H PRN Antonieta Pert, MD   8 mg at 06/07/21 1654   sertraline (ZOLOFT) tablet 100 mg  100 mg Oral Daily Maryagnes Amos, FNP   100 mg at 06/08/21 0820   thiamine (B-1) injection 100 mg  100 mg Intramuscular Once Antonieta Pert, MD       thiamine tablet 100 mg  100 mg Oral Daily Antonieta Pert, MD   100 mg at 06/08/21 0820   traZODone (DESYREL) tablet 100 mg  100 mg Oral QHS PRN Maryagnes Amos, FNP   100 mg at 06/07/21 2113    Lab Results: No results found for this or any previous visit (from the past 48 hour(s)).  Blood Alcohol level:  Lab Results  Component Value Date   ETH 261 (H) 06/05/2021   ETH 279 (H) 05/24/2021    Metabolic Disorder Labs: No results found for: HGBA1C, MPG No results found for: PROLACTIN No results found for: CHOL, TRIG, HDL, CHOLHDL, VLDL, LDLCALC  Physical Findings: AIMS:  , ,  ,  ,    CIWA:  CIWA-Ar Total: 8 COWS:     Musculoskeletal: Strength & Muscle Tone: within normal limits Gait & Station: normal Patient leans: N/A  Psychiatric Specialty Exam:  Presentation  General Appearance: Disheveled  Eye Contact:Fair  Speech:Normal Rate  Speech Volume:Normal  Handedness:Right   Mood  and Affect  Mood:Euthymic  Affect:Congruent   Thought Process  Thought Processes:Goal Directed  Descriptions of Associations:Circumstantial  Orientation:Full (Time, Place and Person)  Thought Content:Logical  History of Schizophrenia/Schizoaffective disorder:No  Duration of Psychotic Symptoms:No data recorded Hallucinations:Hallucinations: None  Ideas of Reference:None  Suicidal Thoughts:Suicidal Thoughts: No  Homicidal Thoughts:Homicidal Thoughts: No   Sensorium  Memory:Immediate Poor;  Recent Poor; Remote Poor  Judgment:Impaired  Insight:Lacking   Executive Functions  Concentration:Fair  Attention Span:Fair  Recall:Poor  Fund of Knowledge:Fair  Language:Good   Psychomotor Activity  Psychomotor Activity:Psychomotor Activity: Normal   Assets  Assets:Desire for Improvement; Resilience   Sleep  Sleep:Number of Hours of Sleep: 6    Physical Exam: Physical Exam Vitals and nursing note reviewed.  HENT:     Head: Normocephalic and atraumatic.  Pulmonary:     Effort: Pulmonary effort is normal.  Neurological:     General: No focal deficit present.     Mental Status: He is alert and oriented to person, place, and time.   Review of Systems  All other systems reviewed and are negative. Blood pressure 108/76, pulse 86, temperature 98.1 F (36.7 C), temperature source Oral, resp. rate 18, height 6' (1.829 m), weight 70.3 kg, SpO2 100 %. Body mass index is 21.02 kg/m.   Treatment Plan Summary: Daily contact with patient to assess and evaluate symptoms and progress in treatment, Medication management, and Plan patient is seen and examined.  Patient is a 30 year old male with the above-stated past psychiatric history who is seen in follow-up.  Diagnosis: 1.  Substance-induced mood disorder 2.  Alcohol dependence. 3.  Reported posttraumatic stress disorder  Pertinent findings on examination today: 1.  No evidence of alcohol withdrawal. 2.  He  denies suicidal or homicidal ideation. 3.  No psychotic symptoms. 4.  Patient has minimal insight towards the last 2 admissions and what led to these admissions.  Plan: 1.  Continue Tegretol 100 mg p.o. 3 times daily for mood stability. 2.  Stop Librium. 3.  Continue folic acid 1 mg p.o. daily for nutritional supplementation. 4.  Continue hydroxyzine 50 mg p.o. 3 times daily as needed anxiety. 5.  Continue Zofran 8 mg p.o. every 6 hours as needed nausea or vomiting. 6.  Continue Zoloft 100 mg p.o. daily for anxiety and depression. 7.  Continue thiamine 100 mg p.o. daily for nutritional supplementation. 8.  Continue trazodone 100 mg p.o. nightly as needed insomnia. 9.  Disposition planning-in progress.  Antonieta Pert, MD 06/08/2021, 1:40 PM

## 2021-06-08 NOTE — [facility] Group Notes (Signed)
[facility] Group Notes:  (Nursing/MHT/Case Management/Adjunct)  Date:  06/08/2021  Time:  9:53 AM  Type of Therapy:  Group Therapy  Participation Level:  Active  Participation Quality:  Appropriate  Affect:  Appropriate  Cognitive:  Appropriate  Insight:  Appropriate  Engagement in Group:  Engaged  Modes of Intervention:  Orientation  Summary of Progress/Problems: Pt goal for today is to get home to his family.   Keats Kingry J Karon Heckendorn 06/08/2021, 9:53 AM

## 2021-06-08 NOTE — Progress Notes (Signed)
Recreation Therapy Notes  INPATIENT RECREATION THERAPY ASSESSMENT  Patient Details Name: John Vaughan MRN: [id number] DOB: 02-14-1991 Today's Date: 06/08/2021       Information Obtained From: Patient  Able to Participate in Assessment/Interview: Yes  Patient Presentation: Alert  Reason for Admission (Per Patient): Other (Comments) (Pt stated his mother thought he was trying to hurt himself.)  Patient Stressors:  (None identified)  Coping Skills:   Journal, Sports, TV, Arguments, Music, Exercise, Meditate, Deep Breathing, Substance Abuse, Talk, Art, Prayer, Avoidance, Read, Hot Bath/Shower  Leisure Interests (2+):  Individual - Other (Comment), Music - Play instrument, Games - Video games, Social - Family (Work on cars)  Frequency of Recreation/Participation:  (Cars, Family, [place]; Video Games-Weekly)  Awareness of Community Resources:  Yes  Community Resources:  Park, Other (Comment) [facility])  Current Use: Yes  If no, Barriers?:    Expressed Interest in [employer] Information: No  County of Residence:  [place]  Patient Main Form of Transportation: Set designer  Patient Strengths:  Secondary school teacher  Patient Identified Areas of Improvement:  Communication  Patient Goal for Hospitalization:  "take medications and get back"  Current SI (including self-harm):  No  Current HI:  No  Current AVH: No  Staff Intervention Plan: Group Attendance, Collaborate with Interdisciplinary Treatment Team  Consent to Intern Participation: N/A   Caroll Rancher, LRT/CTRS  Caroll Rancher A 06/08/2021, 12:20 PM

## 2021-06-08 NOTE — Progress Notes (Signed)
Recreation Therapy Notes  Date: 6.16.22 Time: 1000 Location: [address] Dayroom  Group Topic: Leisure Education  Goal Area(s) Addresses:  Patient will identify positive leisure and recreation activities.  Patient will identify one positive benefit of participation in leisure activities.   Behavioral Response: Engaged  Intervention: Group Game  Activity: Leisure Psychologist, sport and exercise. Patients were divided in to 2 groups for game play. LRT used small strips of paper with 1 listed leisure/recreation activity or random word. Patients took turns randomly drawing a slip of paper and drawing the activity on the board without using words or letters. Points were awarded to the team with the first correct guess. After several rounds of game play, team with the most points were declared the winners.   Education:  Teacher, English as a foreign language, Special educational needs teacher, Discharge Planning  Education Outcome: Acknowledges education/In group clarification offered/Needs additional education  Clinical Observations/Feedback: Pt was social, laughing and engaging with peers.  Pt seemed to be having a good time.  Pt did show some passion for trying to teammates to guess the pictures he had drawn on the board.  Pt was appropriate throughout group session.    Caroll Rancher, LRT/CTRS     Caroll Rancher A 06/08/2021 11:27 AM

## 2021-06-08 NOTE — [facility] Suicide Risk Assessment (Signed)
[facility] INPATIENT:  Family/Significant Other Suicide Prevention Education  Suicide Prevention Education:  Education Completed; Mother, Tsuneo Faison [phone]),  has been identified by the patient as the family member/significant other with whom the patient will be residing, and identified as the person(s) who will aid the patient in the event of a mental health crisis (suicidal ideations/suicide attempt).  With written consent from the patient, the family member/significant other has been provided the following suicide prevention education, prior to the and/or following the discharge of the patient.   CSW spoke with pt's mother who stated that she gave this pt an ultimatum that in order to return home he will need to attend a 30 day program. Pt continues to decline rehab referrals. CSW will reiterate this with patient and encourage rehab at discharge.   The suicide prevention education provided includes the following: Suicide risk factors Suicide prevention and interventions National Suicide Hotline telephone number [facility] assessment telephone number [facility] Emergency Assistance 911 [facility] and/or Residential Mobile Crisis Unit telephone number  Request made of family/significant other to: Remove weapons (e.g., guns, rifles, knives), all items previously/currently identified as safety concern.   Remove drugs/medications (over-the-counter, prescriptions, illicit drugs), all items previously/currently identified as a safety concern.  The family member/significant other verbalizes understanding of the suicide prevention education information provided.  The family member/significant other agrees to remove the items of safety concern listed above.  Deno Sida A Janaiya Beauchesne 06/08/2021, 3:11 PM

## 2021-06-08 NOTE — [facility] Group Notes (Signed)
Occupational Therapy Group Note Date: 06/08/2021 Group Topic/Focus: Coping Skills  Group Description: Group encouraged increased social engagement and participation through discussion/activity focused on brain fitness. Patients were provided education on various brain fitness activities/strategies, with explanation provided on the qualifying factors including: one, that is has to be challenging/hard and two, it has to be something that you do not do every day. Patients engaged actively during group session in various brain fitness activities to increase attention, concentration, and problem-solving skills. Discussion followed with a focus on identifying the benefits of brain fitness activities as use for adaptive coping strategies and distraction.    Therapeutic Goal(s): Identify benefit(s) of brain fitness activities as use for adaptive coping and healthy distraction. Identify specific brain fitness activities to engage in as use for adaptive coping and healthy distraction. Participation Level: Patient did not attend OT group session despite personal invitation.    Plan: Continue to engage patient in OT groups 2 - 3x/week.  06/08/2021  Donne Hazel, MOT, OTR/L

## 2021-06-08 NOTE — Progress Notes (Signed)
   06/07/21 2318  Psych Admission Type (Psych Patients Only)  Admission Status Voluntary  Psychosocial Assessment  Patient Complaints Other (Comment) (some withdrawals)  Eye Contact Fair  Facial Expression Flat  Affect Appropriate to circumstance  Speech Logical/coherent  Interaction Assertive  Appearance/Hygiene Unremarkable  Behavior Characteristics Cooperative;Appropriate to situation  Mood Anxious  Thought Process  Coherency WDL  Content WDL  Delusions WDL  Perception WDL  Hallucination None reported or observed  Judgment WDL  Confusion WDL  Danger to Self  Current suicidal ideation? Denies  Danger to Others  Danger to Others None reported or observed

## 2021-06-08 NOTE — Progress Notes (Addendum)
Pt stated he was still having withdrawal Sx, pt encouraged to use the vistaril before using Trazodone to help with his Anxiety. Pt given PRN Trazodone and Vistaril per MAR Pt stated his mother stated he needed to go to 30 day Tx before he could come home because she believes him cutting his arm was a suicide attempt that pt denies.    06/08/21 2100  Psych Admission Type (Psych Patients Only)  Admission Status Voluntary  Psychosocial Assessment  Patient Complaints Anxiety  Eye Contact Fair  Facial Expression Flat  Affect Appropriate to circumstance  Speech Logical/coherent  Interaction Assertive  Motor Activity Restless  Appearance/Hygiene Unremarkable  Behavior Characteristics Cooperative  Mood Anxious  Thought Process  Coherency WDL  Content WDL  Delusions WDL  Perception WDL  Hallucination None reported or observed  Judgment WDL  Confusion WDL  Danger to Self  Current suicidal ideation? Denies  Danger to Others  Danger to Others None reported or observed

## 2021-06-08 NOTE — [facility] Group Notes (Signed)
[facility] Group Notes:  (Nursing/MHT/Case Management/Adjunct)  Date:  06/08/2021  Time:  4:25 PM  Type of Therapy:  Psychoeducational Skills  Participation Level:  Active  Participation Quality:  Appropriate and Attentive  Affect:  Appropriate  Cognitive:  Alert and Appropriate  Insight:  Appropriate and Good  Engagement in Group:  Engaged  Modes of Intervention:  Education  Summary of Progress/Problems: A poem was shared and discussed about negative behavior patterns and how each pt can relate to the poem in their life.   Lisl Slingerland J Braxley Balandran 06/08/2021, 4:25 PM

## 2021-06-09 LAB — CBC WITH DIFFERENTIAL/PLATELET
Abs Immature Granulocytes: 0.02 10*3/uL (ref 0.00–0.07)
Basophils Absolute: 0.1 10*3/uL (ref 0.0–0.1)
Basophils Relative: 2 %
Eosinophils Absolute: 0.8 10*3/uL — ABNORMAL HIGH (ref 0.0–0.5)
Eosinophils Relative: 12 %
HCT: 45.9 % (ref 39.0–52.0)
Hemoglobin: 15.3 g/dL (ref 13.0–17.0)
Immature Granulocytes: 0 %
Lymphocytes Relative: 33 %
Lymphs Abs: 2.2 10*3/uL (ref 0.7–4.0)
MCH: 33.8 pg (ref 26.0–34.0)
MCHC: 33.3 g/dL (ref 30.0–36.0)
MCV: 101.3 fL — ABNORMAL HIGH (ref 80.0–100.0)
Monocytes Absolute: 0.8 10*3/uL (ref 0.1–1.0)
Monocytes Relative: 13 %
Neutro Abs: 2.6 10*3/uL (ref 1.7–7.7)
Neutrophils Relative %: 40 %
Platelets: 360 10*3/uL (ref 150–400)
RBC: 4.53 MIL/uL (ref 4.22–5.81)
RDW: 12.5 % (ref 11.5–15.5)
WBC: 6.5 10*3/uL (ref 4.0–10.5)
nRBC: 0 % (ref 0.0–0.2)

## 2021-06-09 LAB — COMPREHENSIVE METABOLIC PANEL
ALT: 16 U/L (ref 0–44)
AST: 17 U/L (ref 15–41)
Albumin: 4.4 g/dL (ref 3.5–5.0)
Alkaline Phosphatase: 44 U/L (ref 38–126)
Anion gap: 8 (ref 5–15)
BUN: 12 mg/dL (ref 6–20)
CO2: 28 mmol/L (ref 22–32)
Calcium: 10.2 mg/dL (ref 8.9–10.3)
Chloride: 105 mmol/L (ref 98–111)
Creatinine, Ser: 0.83 mg/dL (ref 0.61–1.24)
GFR, Estimated: >60 mL/min (ref >60)
Glucose, Bld: 95 mg/dL (ref 70–99)
Potassium: 4.5 mmol/L (ref 3.5–5.1)
Sodium: 141 mmol/L (ref 135–145)
Total Bilirubin: 0.5 mg/dL (ref 0.3–1.2)
Total Protein: 7.9 g/dL (ref 6.5–8.1)

## 2021-06-09 LAB — CARBAMAZEPINE LEVEL, TOTAL: Carbamazepine Lvl: 3.5 ug/mL — ABNORMAL LOW (ref 4.0–12.0)

## 2021-06-09 MED ORDER — PANTOPRAZOLE SODIUM 40 MG PO TBEC
40.0000 mg | DELAYED_RELEASE_TABLET | Freq: Every day | ORAL | Status: DC
  Administered 2021-06-09 – 2021-06-11 (×3): 40 mg via ORAL
  Filled 2021-06-09: qty 7
  Filled 2021-06-09 (×2): qty 1
  Filled 2021-06-09: qty 7
  Filled 2021-06-09: qty 1

## 2021-06-09 MED ORDER — DICYCLOMINE HCL 10 MG PO CAPS
10.0000 mg | ORAL_CAPSULE | Freq: Three times a day (TID) | ORAL | Status: DC
  Administered 2021-06-09 – 2021-06-11 (×8): 10 mg via ORAL
  Filled 2021-06-09: qty 1
  Filled 2021-06-09 (×2): qty 28
  Filled 2021-06-09: qty 1
  Filled 2021-06-09: qty 28
  Filled 2021-06-09 (×3): qty 1
  Filled 2021-06-09: qty 28
  Filled 2021-06-09 (×6): qty 1

## 2021-06-09 NOTE — Progress Notes (Signed)
   06/09/21 0900  Psych Admission Type (Psych Patients Only)  Admission Status Voluntary  Psychosocial Assessment  Patient Complaints Anxiety  Eye Contact Fair  Facial Expression Flat  Affect Appropriate to circumstance  Speech Logical/coherent  Interaction Assertive  Motor Activity Restless  Appearance/Hygiene Unremarkable  Behavior Characteristics Cooperative  Mood Anxious  Thought Process  Coherency WDL  Content WDL  Delusions WDL  Perception WDL  Hallucination None reported or observed  Judgment WDL  Confusion WDL  Danger to Self  Current suicidal ideation? Denies  Danger to Others  Danger to Others None reported or observed

## 2021-06-09 NOTE — Progress Notes (Signed)
Recreation Therapy Notes  Date: 6.17.22 Time: 1000 Location: 500 Hall Dayroom  Group Topic: Communication, Team Building, Problem Solving  Goal Area(s) Addresses:  Patient will effectively work with peer towards shared goal.  Patient will identify skills used to make activity successful.  Patient will share challenges and verbalize solution-driven approaches used. Patient will identify how skills used during activity can be used to reach post d/c goals.   Intervention: STEM Activity   Activity: [employer]. Patients were provided the following materials: 2 drinking straws, 5 rubber bands, 5 paper clips, 2 index cards and 2 drinking cups. Using the provided materials patients were asked to build a launching mechanism to launch a ping pong ball across the room, approximately 10 feet. Patients were divided into teams of 3-5. Instructions required all materials be incorporated into the device, functionality of items left to the peer group's discretion.  Education: Pharmacist, community, Scientist, physiological, Air cabin crew, Building control surveyor.   Education Outcome: Acknowledges education/In group clarification offered/Needs additional education.   Clinical Observations/Feedback:  Pt did not attend group session.   Caroll Rancher, LRT/CTRS         Caroll Rancher A 06/09/2021 11:43 AM

## 2021-06-09 NOTE — Progress Notes (Signed)
Pt visible on the unit this evening, pt stated he was doing better. Pt given PRN Trazodone and Vistaril per MAR with HS medication.    06/09/21 2100  Psych Admission Type (Psych Patients Only)  Admission Status Voluntary  Psychosocial Assessment  Patient Complaints Anxiety  Eye Contact Fair  Facial Expression Flat  Affect Appropriate to circumstance  Speech Logical/coherent  Interaction Assertive  Motor Activity Restless  Appearance/Hygiene Unremarkable  Behavior Characteristics Cooperative  Mood Anxious  Thought Process  Coherency WDL  Content WDL  Delusions WDL  Perception WDL  Hallucination None reported or observed  Judgment WDL  Confusion WDL  Danger to Self  Current suicidal ideation? Denies  Danger to Others  Danger to Others None reported or observed

## 2021-06-09 NOTE — B[facility] Counselor (Signed)
CSW spoke with this patient regarding discharge. Pt is aware of the ultimatum his mother made with the matter of him returning home.  Pt continues to decline residential substance use treatment at this time. Pt has no plan of where he would go. Pt states "I need to convince my mother that I can stay clean on my own." Pt states he has done this in the past.   Pt was provided with shelter resources.       Ruthann Cancer MSW, LCSW Clincal Social Worker  [facility]

## 2021-06-09 NOTE — Progress Notes (Signed)
[facility] MD Progress Note  06/09/2021 2:49 PM John Vaughan  MRN:  [id number] Subjective:  Patient is a 30 year old male with a past psychiatric history significant for alcohol dependence, substance-induced mood disorder as well as suicidal ideation who was admitted on 06/07/2021 under involuntary commitment after self-injurious behavior.  Objective: Patient is seen and examined.  Patient is a 30 year old male with the above-stated past psychiatric history who is seen in follow-up.  He is lying in bed this afternoon.  He stated his abdominal pain and nausea were back.  We discussed the fact that his mother had stated that he is unable to return home and less he agrees to a rehabilitation facility.  He has decided to continue decline that.  He was given homelessness resources today by social work.  I asked him why he would want to be homeless versus getting some assistance with his alcohol problem.  He was unable to answer that.  No suicidal ideation.  His vital signs are stable, he is afebrile.  Pulse oximetry on room air was 100%.  They did not record his sleep last night.  His most recent CIWA was 2 days ago.  Laboratories from this a.m. showed normal electrolytes including creatinine and liver function enzymes.  His CBC still showed a mild elevation of his MCV at 101.3, but was otherwise normal.  Differential was normal.  His Tegretol level on 6/17 was 3.5.  Principal Problem: <principal problem not specified> Diagnosis: Active Problems:   MDD (major depressive disorder), recurrent episode, severe (HCC)  Total Time spent with patient: 15 minutes  Past Psychiatric History: See admission H&P  Past Medical History:  Past Medical History:  Diagnosis Date   Alcohol abuse    Anxiety    Depression    History reviewed. No pertinent surgical history. Family History:  Family History  Problem Relation Age of Onset   Anxiety disorder Mother    Alcohol abuse Father    Anxiety disorder Maternal  Aunt    Anxiety disorder Maternal Grandmother    Alcohol abuse Paternal Grandfather    Family Psychiatric  History: See admission H&P Social History:  Social History   Substance and Sexual Activity  Alcohol Use Yes   Alcohol/week: 2.0 standard drinks   Types: 2 Cans of beer per week   Comment: states that he drinks 2 beers daily     Social History   Substance and Sexual Activity  Drug Use Not Currently   Types: Marijuana, Cocaine   Comment: cocaine in last month    Social History   Socioeconomic History   Marital status: Single    Spouse name: Not on file   Number of children: Not on file   Years of education: Not on file   Highest education level: Not on file  Occupational History   Not on file  Tobacco Use   Smoking status: Every Day    Packs/day: 1.50    Pack years: 0.00    Types: Cigarettes   Smokeless tobacco: Former    Types: Snuff  Vaping Use   Vaping Use: Never used  Substance and Sexual Activity   Alcohol use: Yes    Alcohol/week: 2.0 standard drinks    Types: 2 Cans of beer per week    Comment: states that he drinks 2 beers daily   Drug use: Not Currently    Types: Marijuana, Cocaine    Comment: cocaine in last month   Sexual activity: Not Currently  Other Topics Concern  Not on file  Social History Narrative   Not on file   Social Determinants of Health   Financial Resource Strain: Not on file  Food Insecurity: Not on file  Transportation Needs: Not on file  Physical Activity: Not on file  Stress: Not on file  Social Connections: Not on file   Additional Social History:                         Sleep: Good  Appetite:  Fair  Current Medications: Current Facility-Administered Medications  Medication Dose Route Frequency Provider Last Rate Last Admin   carbamazepine (TEGRETOL) chewable tablet 100 mg  100 mg Oral TID Maryagnes Amos, FNP   100 mg at 06/09/21 0746   chlordiazePOXIDE (LIBRIUM) capsule 25 mg  25 mg Oral  Q6H PRN Antonieta Pert, MD   25 mg at 06/09/21 0749   folic acid (FOLVITE) tablet 1 mg  1 mg Oral Daily Antonieta Pert, MD   1 mg at 06/09/21 0746   hydrOXYzine (ATARAX/VISTARIL) tablet 50 mg  50 mg Oral TID PRN Maryagnes Amos, FNP   50 mg at 06/08/21 2105   risperiDONE (RISPERDAL M-TABS) disintegrating tablet 2 mg  2 mg Oral Q8H PRN Antonieta Pert, MD       And   LORazepam (ATIVAN) tablet 1 mg  1 mg Oral Q6H PRN Antonieta Pert, MD   1 mg at 06/07/21 1654   And   ziprasidone (GEODON) injection 20 mg  20 mg Intramuscular Q6H PRN Antonieta Pert, MD       magnesium hydroxide (MILK OF MAGNESIA) suspension 30 mL  30 mL Oral Daily PRN Starkes-Perry, Juel Burrow, FNP       nicotine (NICODERM CQ - dosed in mg/24 hours) patch 21 mg  21 mg Transdermal Daily Maryagnes Amos, FNP   21 mg at 06/09/21 0748   ondansetron (ZOFRAN) tablet 8 mg  8 mg Oral Q6H PRN Antonieta Pert, MD   8 mg at 06/07/21 1654   sertraline (ZOLOFT) tablet 100 mg  100 mg Oral Daily Maryagnes Amos, FNP   100 mg at 06/09/21 0747   thiamine (B-1) injection 100 mg  100 mg Intramuscular Once Antonieta Pert, MD       thiamine tablet 100 mg  100 mg Oral Daily Antonieta Pert, MD   100 mg at 06/09/21 0746   traZODone (DESYREL) tablet 100 mg  100 mg Oral QHS PRN Maryagnes Amos, FNP   100 mg at 06/08/21 2105    Lab Results:  Results for orders placed or performed during the hospital encounter of 06/06/21 (from the past 48 hour(s))  Carbamazepine level, total     Status: Abnormal   Collection Time: 06/09/21  6:26 AM  Result Value Ref Range   Carbamazepine Lvl 3.5 (L) 4.0 - 12.0 ug/mL    Comment: Performed at [facility], [address], [place], [place] [address]  CBC with Differential/Platelet     Status: Abnormal   Collection Time: 06/09/21  6:26 AM  Result Value Ref Range   WBC 6.5 4.0 - 10.5 K/uL   RBC 4.53 4.22 - 5.81 MIL/uL   Hemoglobin 15.3 13.0 - 17.0 g/dL   HCT  93.7 90.2 - 40.9 %   MCV 101.3 (H) 80.0 - 100.0 fL   MCH 33.8 26.0 - 34.0 pg   MCHC 33.3 30.0 - 36.0 g/dL   RDW  12.5 11.5 - 15.5 %   Platelets 360 150 - 400 K/uL   nRBC 0.0 0.0 - 0.2 %   Neutrophils Relative % 40 %   Neutro Abs 2.6 1.7 - 7.7 K/uL   Lymphocytes Relative 33 %   Lymphs Abs 2.2 0.7 - 4.0 K/uL   Monocytes Relative 13 %   Monocytes Absolute 0.8 0.1 - 1.0 K/uL   Eosinophils Relative 12 %   Eosinophils Absolute 0.8 (H) 0.0 - 0.5 K/uL   Basophils Relative 2 %   Basophils Absolute 0.1 0.0 - 0.1 K/uL   Immature Granulocytes 0 %   Abs Immature Granulocytes 0.02 0.00 - 0.07 K/uL    Comment: Performed at [facility], [address] [place], [place] [address]  Comprehensive metabolic panel     Status: None   Collection Time: 06/09/21  6:26 AM  Result Value Ref Range   Sodium 141 135 - 145 mmol/L   Potassium 4.5 3.5 - 5.1 mmol/L   Chloride 105 98 - 111 mmol/L   CO2 28 22 - 32 mmol/L   Glucose, Bld 95 70 - 99 mg/dL    Comment: Glucose reference range applies only to samples taken after fasting for at least 8 hours.   BUN 12 6 - 20 mg/dL   Creatinine, Ser 2.950.83 0.61 - 1.24 mg/dL   Calcium 28.410.2 8.9 - 13.210.3 mg/dL   Total Protein 7.9 6.5 - 8.1 g/dL   Albumin 4.4 3.5 - 5.0 g/dL   AST 17 15 - 41 U/L   ALT 16 0 - 44 U/L   Alkaline Phosphatase 44 38 - 126 U/L   Total Bilirubin 0.5 0.3 - 1.2 mg/dL   GFR, Estimated >44>60 >01>60 mL/min    Comment: (NOTE) Calculated using the CKD-EPI Creatinine Equation (2021)    Anion gap 8 5 - 15    Comment: Performed at [facility], [address] [place], [place] [address]    Blood Alcohol level:  Lab Results  Component Value Date   ETH 261 (H) 06/05/2021   ETH 279 (H) 05/24/2021    Metabolic Disorder Labs: No results found for: HGBA1C, MPG No results found for: PROLACTIN No results found for: CHOL, TRIG, HDL, CHOLHDL, VLDL, LDLCALC  Physical Findings: AIMS:  , ,  ,  ,    CIWA:  CIWA-Ar Total:  8 COWS:     Musculoskeletal: Strength & Muscle Tone: within normal limits Gait & Station: normal Patient leans: N/A  Psychiatric Specialty Exam:  Presentation  General Appearance: Disheveled  Eye Contact:Fair  Speech:Normal Rate  Speech Volume:Decreased  Handedness:Right   Mood and Affect  Mood:Dysphoric  Affect:Congruent   Thought Process  Thought Processes:Coherent  Descriptions of Associations:Intact  Orientation:Full (Time, Place and Person)  Thought Content:Logical  History of Schizophrenia/Schizoaffective disorder:No  Duration of Psychotic Symptoms:No data recorded Hallucinations:Hallucinations: None  Ideas of Reference:None  Suicidal Thoughts:Suicidal Thoughts: No  Homicidal Thoughts:Homicidal Thoughts: No   Sensorium  Memory:Immediate Fair; Recent Fair; Remote Fair  Judgment:Impaired  Insight:Lacking   Executive Functions  Concentration:Good  Attention Span:Good  Recall:Good  Fund of Knowledge:Good  Language:Good   Psychomotor Activity  Psychomotor Activity:Psychomotor Activity: Normal   Assets  Assets:Desire for Improvement; Resilience; Social Support   Sleep  Sleep:Sleep: Good    Physical Exam: Physical Exam Vitals and nursing note reviewed.  HENT:     Head: Normocephalic and atraumatic.  Pulmonary:     Effort: Pulmonary effort is normal.  Neurological:  General: No focal deficit present.     Mental Status: He is alert and oriented to person, place, and time.   Review of Systems  Gastrointestinal:  Positive for abdominal pain and nausea.  All other systems reviewed and are negative. Blood pressure 97/68, pulse 78, temperature 97.8 F (36.6 C), temperature source Oral, resp. rate 18, height 6' (1.829 m), weight 70.3 kg, SpO2 100 %. Body mass index is 21.02 kg/m.   Treatment Plan Summary: Daily contact with patient to assess and evaluate symptoms and progress in treatment, Medication management, and Plan  patient is seen and examined.  Patient is a 30 year old male with the above-stated past psychiatric history who is seen in follow-up.  Diagnosis: 1.  Substance-induced mood disorder 2.  Alcohol dependence. 3.  Reported posttraumatic stress disorder 4.  Cocaine use disorder  Pertinent findings on examination today: 1.  He stated his abdominal pain and nausea were worse today. 2.  No autonomic symptoms suggestive of worsening alcohol withdrawal. 3.  No suicidal or homicidal ideation. 4.  No psychotic symptoms. 5.  Despite his mother placing down boundaries patient still refuses to go to substance rehabilitation.  Plan: 1.  Continue Tegretol 100 mg p.o. 3 times daily for mood stability. 2.  We will continue his Librium 25 mg p.o. every 6 hours as needed withdrawal symptoms with a CIWA greater than 10. 3.  Continue folic acid 1 mg p.o. daily for nutritional supplementation. 4.  Continue hydroxyzine 50 mg p.o. 3 times daily as needed anxiety. 5.  Continue Zofran 8 mg p.o. every 6 hours as needed nausea and vomiting. 6.  Continue sertraline 100 mg p.o. daily for depression and anxiety. 7.  Restart Protonix 40 mg p.o. daily for gastric distress. 8.  Continue thiamine 100 mg p.o. daily for nutritional supplementation. 9.  Continue trazodone 100 mg p.o. nightly as needed insomnia. 10.  Disposition planning-in progress.  Antonieta Pert, MD 06/09/2021, 2:49 PM

## 2021-06-09 NOTE — B[facility] Counselor (Signed)
CSW spoke with patients mother who wanted an update on this pt's plan. Pt's mother stated that Father's Day is a trigger for him and her due to the recent loss of his father. She is requesting he stay past Father's Day to this day being a trigger and concern he will leave and use due to this.    Ruthann Cancer MSW, LCSW Clincal Social Worker  [facility]

## 2021-06-09 NOTE — Progress Notes (Addendum)
Adult Psychoeducational Group Note  Date:  06/09/2021 Time:  10:31 PM  Group Topic/Focus:  Wrap-Up Group:   The focus of this group is to help patients review their daily goal of treatment and discuss progress on daily workbooks.  Participation Level:  Active  Participation Quality:  Appropriate  Affect:  Appropriate  Cognitive:  Appropriate  Insight: Appropriate  Engagement in Group:  Developing/Improving  Modes of Intervention:  Discussion  Additional Comments:  Pt stated his goal for today was to focus on his treatment plan. Pt stated he accomplished his goal today. Pt stated he talk with his doctor and his social worker about his care today. Pt rated his overall day a 5 out of 10.  Pt stated he made no calls today. Pt stated he attend all groups today.  Pt stated he took all medications provided today. Pt stated his appetite was good today. Pt rated sleep last night as good. Pt stated the goal tonight was to get some rest. Pt stated he had some physical pain tonight. Pt stated he had some mild pain in hist stomach tonight. Pt rated the mild pain in his stomach a 3 on the pain level scale. Pt nurse was updated on situation.  Pt deny visual hallucinations and auditory issues tonight. Pt denies thoughts of harming himself and others. Pt stated he would alert staff if anything changed.  Felipa Furnace 06/09/2021, 10:31 PM

## 2021-06-10 NOTE — [facility] Group Notes (Signed)
  [facility] LCSW Group Therapy Note  Date/Time:  06/10/2021 11:15AM-12:00PM  Type of Therapy and Topic:  Group Therapy:  Feelings About Hospitalization  Participation Level:  Active   Description of Group This process group involved patients discussing their feelings related to being hospitalized, as well as the benefits they see to being in the hospital.  These feelings and benefits were itemized.  The group then brainstormed specific ways in which they could seek those same benefits when they discharge and return home.  Therapeutic Goals Patient will identify and describe positive and negative feelings related to hospitalization Patient will verbalize benefits of hospitalization to themselves personally Patients will brainstorm together ways they can obtain similar benefits in the outpatient setting, identify barriers to wellness and possible solutions  Summary of Patient Progress:  The patient expressed his primary feelings about being hospitalized are that it feels good being here, although he hates the reasons he is here.  He said that he has to decide by tomorrow whether to go back home and use the skills he believes will help him or go out on the street.  He said he wants to go home, and he has asked his mother several times to go to family therapy with him, but she will not agree.  She wants him to go to 30-day rehab, and he does not think he needs that.  He said alcohol for him is either a trigger or a bandaid, but it is not his main problem.  Therapeutic Modalities Cognitive Behavioral Therapy Motivational Interviewing    Ambrose Mantle, LCSW 06/10/2021, 3:15 PM

## 2021-06-10 NOTE — Progress Notes (Signed)
Adult Psychoeducational Group Note  Date:  06/10/2021 Time:  8:36 PM  Group Topic/Focus:  Wrap-Up Group:   The focus of this group is to help patients review their daily goal of treatment and discuss progress on daily workbooks.  Participation Level:  Active  Participation Quality:  Appropriate  Affect:  Appropriate  Cognitive:  Appropriate  Insight: Appropriate  Engagement in Group:  Developing/Improving  Modes of Intervention:  Discussion  Additional Comments:  Pt stated his goal for today was to focus on his treatment plan and talk with his doctor about his discharge plan. Pt stated he accomplished his goals today.  Pt stated the plan is for him to get discharge on 06/11/2021. Pt stated he talk with his doctor but did not get a chance to talk with his social worker about his care today. Pt rated his overall day a 5 out of 10.  Pt stated he made calls to his mother today which improved his overall day. Pt stated he attend all groups today.  Pt stated he took all medications provided today. Pt stated his appetite was good today. Pt rated sleep last night as good. Pt stated the goal tonight was to get some rest. Pt stated he had  no physical pain tonight.   Pt deny visual hallucinations and auditory issues tonight. Pt denies thoughts of harming himself and others. Pt stated he would alert staff if anything changed.  John Vaughan 06/10/2021, 8:36 PM

## 2021-06-10 NOTE — Progress Notes (Signed)
[facility] MD Progress Note  06/10/2021 11:13 AM John Vaughan  MRN:  [id number] Subjective:  Patient is a 30 year old male with a past psychiatric history significant for alcohol dependence, substance-induced mood disorder as well as suicidal ideation who was admitted on 06/07/2021 under involuntary commitment after self-injurious behavior.  Objective: Patient is seen and examined.  Patient is a 30 year old male with the above-stated past psychiatric history who is seen in follow-up.  His hygiene is better today.  He got up, took a shower, and is combed and appropriately dressed.  No complaints with GI issues today after changes in his medicines yesterday.  He continues to decline referral to a subs abuse treatment program.  We again discussed the fact that his mother told him that to return to the home he would have to go into some form of residential treatment before he could return.  He is declining that.  He told me today that he is still going to try and talk his mother into allowing him to come home.  We discussed the discharge plan, and he understands that his participation in finding housing and programs has not been great, and that the plan would be to discharge him tomorrow.  He understands that.  He did state he was anxious about that.  His vital signs are stable, he is afebrile.  No evidence of withdrawal.  He slept 6 hours last night.  Laboratories from 6/16 showed completely normal electrolytes and creatinine.  This includes liver function enzymes.  CBC showed an elevated MCV at 101.3, but otherwise normal.  Differential was normal.   Principal Problem: <principal problem not specified> Diagnosis: Active Problems:   MDD (major depressive disorder), recurrent episode, severe (HCC)  Total Time spent with patient: 15 minutes  Past Psychiatric History: See admission H&P  Past Medical History:  Past Medical History:  Diagnosis Date   Alcohol abuse    Anxiety    Depression    History  reviewed. No pertinent surgical history. Family History:  Family History  Problem Relation Age of Onset   Anxiety disorder Mother    Alcohol abuse Father    Anxiety disorder Maternal Aunt    Anxiety disorder Maternal Grandmother    Alcohol abuse Paternal Grandfather    Family Psychiatric  History: See admission H&P Social History:  Social History   Substance and Sexual Activity  Alcohol Use Yes   Alcohol/week: 2.0 standard drinks   Types: 2 Cans of beer per week   Comment: states that he drinks 2 beers daily     Social History   Substance and Sexual Activity  Drug Use Not Currently   Types: Marijuana, Cocaine   Comment: cocaine in last month    Social History   Socioeconomic History   Marital status: Single    Spouse name: Not on file   Number of children: Not on file   Years of education: Not on file   Highest education level: Not on file  Occupational History   Not on file  Tobacco Use   Smoking status: Every Day    Packs/day: 1.50    Pack years: 0.00    Types: Cigarettes   Smokeless tobacco: Former    Types: Snuff  Vaping Use   Vaping Use: Never used  Substance and Sexual Activity   Alcohol use: Yes    Alcohol/week: 2.0 standard drinks    Types: 2 Cans of beer per week    Comment: states that he drinks 2 beers daily  Drug use: Not Currently    Types: Marijuana, Cocaine    Comment: cocaine in last month   Sexual activity: Not Currently  Other Topics Concern   Not on file  Social History Narrative   Not on file   Social Determinants of Health   Financial Resource Strain: Not on file  Food Insecurity: Not on file  Transportation Needs: Not on file  Physical Activity: Not on file  Stress: Not on file  Social Connections: Not on file   Additional Social History:                         Sleep: Good  Appetite:  Good  Current Medications: Current Facility-Administered Medications  Medication Dose Route Frequency Provider Last Rate  Last Admin   carbamazepine (TEGRETOL) chewable tablet 100 mg  100 mg Oral TID Maryagnes AmosStarkes-Perry, Takia S, FNP   100 mg at 06/10/21 0758   dicyclomine (BENTYL) capsule 10 mg  10 mg Oral TID AC & HS Antonieta Pertlary, Ritu Gagliardo Lawson, MD   10 mg at 06/10/21 16100611   folic acid (FOLVITE) tablet 1 mg  1 mg Oral Daily Antonieta Pertlary, Leonilda Cozby Lawson, MD   1 mg at 06/10/21 0757   hydrOXYzine (ATARAX/VISTARIL) tablet 50 mg  50 mg Oral TID PRN Maryagnes AmosStarkes-Perry, Takia S, FNP   50 mg at 06/10/21 0800   magnesium hydroxide (MILK OF MAGNESIA) suspension 30 mL  30 mL Oral Daily PRN Maryagnes AmosStarkes-Perry, Takia S, FNP       nicotine (NICODERM CQ - dosed in mg/24 hours) patch 21 mg  21 mg Transdermal Daily Maryagnes AmosStarkes-Perry, Takia S, FNP   21 mg at 06/10/21 0756   ondansetron (ZOFRAN) tablet 8 mg  8 mg Oral Q6H PRN Antonieta Pertlary, Hildur Bayer Lawson, MD   8 mg at 06/07/21 1654   pantoprazole (PROTONIX) EC tablet 40 mg  40 mg Oral Daily Antonieta Pertlary, Adelisa Satterwhite Lawson, MD   40 mg at 06/10/21 0758   risperiDONE (RISPERDAL M-TABS) disintegrating tablet 2 mg  2 mg Oral Q8H PRN Antonieta Pertlary, Manjit Bufano Lawson, MD       And   ziprasidone (GEODON) injection 20 mg  20 mg Intramuscular Q6H PRN Antonieta Pertlary, Janeya Deyo Lawson, MD       sertraline (ZOLOFT) tablet 100 mg  100 mg Oral Daily Maryagnes AmosStarkes-Perry, Takia S, FNP   100 mg at 06/10/21 0758   thiamine (B-1) injection 100 mg  100 mg Intramuscular Once Antonieta Pertlary, Ciel Yanes Lawson, MD       thiamine tablet 100 mg  100 mg Oral Daily Antonieta Pertlary, Timmya Blazier Lawson, MD   100 mg at 06/10/21 0758   traZODone (DESYREL) tablet 100 mg  100 mg Oral QHS PRN Maryagnes AmosStarkes-Perry, Takia S, FNP   100 mg at 06/09/21 2057    Lab Results:  Results for orders placed or performed during the hospital encounter of 06/06/21 (from the past 48 hour(s))  Carbamazepine level, total     Status: Abnormal   Collection Time: 06/09/21  6:26 AM  Result Value Ref Range   Carbamazepine Lvl 3.5 (L) 4.0 - 12.0 ug/mL    Comment: Performed at [facility], [address] [place], [place] [id number]  CBC with Differential/Platelet      Status: Abnormal   Collection Time: 06/09/21  6:26 AM  Result Value Ref Range   WBC 6.5 4.0 - 10.5 K/uL   RBC 4.53 4.22 - 5.81 MIL/uL   Hemoglobin 15.3 13.0 - 17.0 g/dL   HCT 40.945.9 81.139.0 - 91.452.0 %  MCV 101.3 (H) 80.0 - 100.0 fL   MCH 33.8 26.0 - 34.0 pg   MCHC 33.3 30.0 - 36.0 g/dL   RDW 14.4 31.5 - 40.0 %   Platelets 360 150 - 400 K/uL   nRBC 0.0 0.0 - 0.2 %   Neutrophils Relative % 40 %   Neutro Abs 2.6 1.7 - 7.7 K/uL   Lymphocytes Relative 33 %   Lymphs Abs 2.2 0.7 - 4.0 K/uL   Monocytes Relative 13 %   Monocytes Absolute 0.8 0.1 - 1.0 K/uL   Eosinophils Relative 12 %   Eosinophils Absolute 0.8 (H) 0.0 - 0.5 K/uL   Basophils Relative 2 %   Basophils Absolute 0.1 0.0 - 0.1 K/uL   Immature Granulocytes 0 %   Abs Immature Granulocytes 0.02 0.00 - 0.07 K/uL    Comment: Performed at [facility], [address], [place], [place] [address]  Comprehensive metabolic panel     Status: None   Collection Time: 06/09/21  6:26 AM  Result Value Ref Range   Sodium 141 135 - 145 mmol/L   Potassium 4.5 3.5 - 5.1 mmol/L   Chloride 105 98 - 111 mmol/L   CO2 28 22 - 32 mmol/L   Glucose, Bld 95 70 - 99 mg/dL    Comment: Glucose reference range applies only to samples taken after fasting for at least 8 hours.   BUN 12 6 - 20 mg/dL   Creatinine, Ser 9.50 0.61 - 1.24 mg/dL   Calcium 93.2 8.9 - 67.1 mg/dL   Total Protein 7.9 6.5 - 8.1 g/dL   Albumin 4.4 3.5 - 5.0 g/dL   AST 17 15 - 41 U/L   ALT 16 0 - 44 U/L   Alkaline Phosphatase 44 38 - 126 U/L   Total Bilirubin 0.5 0.3 - 1.2 mg/dL   GFR, Estimated >24 >58 mL/min    Comment: (NOTE) Calculated using the CKD-EPI Creatinine Equation (2021)    Anion gap 8 5 - 15    Comment: Performed at [facility], [address] [place], [place] [address]    Blood Alcohol level:  Lab Results  Component Value Date   ETH 261 (H) 06/05/2021   ETH 279 (H) 05/24/2021    Metabolic Disorder Labs: No results  found for: HGBA1C, MPG No results found for: PROLACTIN No results found for: CHOL, TRIG, HDL, CHOLHDL, VLDL, LDLCALC  Physical Findings: AIMS:  , ,  ,  ,    CIWA:  CIWA-Ar Total: 8 COWS:     Musculoskeletal: Strength & Muscle Tone: within normal limits Gait & Station: normal Patient leans: N/A  Psychiatric Specialty Exam:  Presentation  General Appearance: Disheveled  Eye Contact:Fair  Speech:Normal Rate  Speech Volume:Decreased  Handedness:Right   Mood and Affect  Mood:Dysphoric  Affect:Congruent   Thought Process  Thought Processes:Coherent  Descriptions of Associations:Intact  Orientation:Full (Time, Place and Person)  Thought Content:Logical  History of Schizophrenia/Schizoaffective disorder:No  Duration of Psychotic Symptoms:No data recorded Hallucinations:Hallucinations: None  Ideas of Reference:None  Suicidal Thoughts:Suicidal Thoughts: No  Homicidal Thoughts:Homicidal Thoughts: No   Sensorium  Memory:Immediate Fair; Recent Fair; Remote Fair  Judgment:Impaired  Insight:Lacking   Executive Functions  Concentration:Good  Attention Span:Good  Recall:Good  Fund of Knowledge:Good  Language:Good   Psychomotor Activity  Psychomotor Activity:Psychomotor Activity: Normal   Assets  Assets:Desire for Improvement; Resilience; Social Support   Sleep  Sleep:Sleep: Good    Physical Exam: Physical Exam Vitals and nursing note reviewed.  Constitutional:      Appearance: Normal appearance.  HENT:     Head: Normocephalic and atraumatic.  Pulmonary:     Effort: Pulmonary effort is normal.  Neurological:     General: No focal deficit present.     Mental Status: He is alert and oriented to person, place, and time.   Review of Systems  All other systems reviewed and are negative. Blood pressure 98/80, pulse 61, temperature (!) 97.2 F (36.2 C), temperature source Oral, resp. rate 14, height 6' (1.829 m), weight 70.3 kg, SpO2 100  %. Body mass index is 21.02 kg/m.   Treatment Plan Summary: Daily contact with patient to assess and evaluate symptoms and progress in treatment, Medication management, and Plan patient is seen and examined.  Patient is a 30 year old male with the above-stated past psychiatric history who is seen in follow-up.  Diagnosis: 1.  Substance-induced mood disorder 2.  Alcohol dependence. 3.  Reported posttraumatic stress disorder 4.  Cocaine use disorder  Pertinent findings on examination today: 1.  Hygiene is improved. 2.  Abdominal discomfort improved. 3.  No evidence of withdrawal symptoms. 4.  No suicidal or homicidal ideation. 5.  No psychotic symptoms. 6.  Patient stated he will become more involved in his discharge plan.  Plan: 1.  Continue Tegretol 100 mg p.o. 3 times daily for mood stability and anxiety. 2.  Continue Bentyl 10 mg p.o. 3 times daily before meals and at bedtime for abdominal spasms. 3.  Continue folic acid 1 mg p.o. daily for nutritional supplementation. 4.  Continue hydroxyzine 50 mg p.o. 3 times daily as needed anxiety. 5.  Stop Librium. 6.  Continue Zofran 8 mg p.o. every 8 hours as needed nausea or vomiting. 7.  Continue Protonix 40 mg p.o. daily for gastric protection. 8.  Continue Risperdal agitation protocol as needed. 9.  Continue sertraline 100 mg p.o. daily for anxiety and depression. 10.  Continue thiamine 100 mg p.o. daily for nutritional supplementation. 11.  Continue trazodone 100 mg p.o. nightly as needed insomnia. 12.  Disposition planning-probable discharge in 1 to 2 days.  Antonieta Pert, MD 06/10/2021, 11:13 AM

## 2021-06-10 NOTE — Progress Notes (Signed)
   06/10/21 1200  Psych Admission Type (Psych Patients Only)  Admission Status Voluntary  Psychosocial Assessment  Patient Complaints Anxiety  Eye Contact Fair  Facial Expression Flat  Affect Appropriate to circumstance  Speech Logical/coherent  Interaction Assertive  Motor Activity Restless  Appearance/Hygiene Unremarkable  Behavior Characteristics Cooperative  Mood Anxious  Thought Process  Coherency WDL  Content WDL  Delusions WDL  Perception WDL  Hallucination None reported or observed  Judgment WDL  Confusion WDL  Danger to Self  Current suicidal ideation? Denies  Danger to Others  Danger to Others None reported or observed

## 2021-06-10 NOTE — Progress Notes (Signed)
   06/10/21 0500  Sleep  Number of Hours 6

## 2021-06-11 MED ORDER — TRAZODONE HCL 100 MG PO TABS: 100.0000 mg | ORAL_TABLET | Freq: Every evening | ORAL | 0 refills | Status: DC | PRN

## 2021-06-11 MED ORDER — DICYCLOMINE HCL 10 MG PO CAPS: 10.0000 mg | ORAL_CAPSULE | Freq: Three times a day (TID) | ORAL | 0 refills | Status: DC

## 2021-06-11 MED ORDER — GABAPENTIN 400 MG PO CAPS: 400.0000 mg | ORAL_CAPSULE | Freq: Three times a day (TID) | ORAL | 0 refills | Status: DC

## 2021-06-11 MED ORDER — SERTRALINE HCL 50 MG PO TABS: 100.0000 mg | ORAL_TABLET | Freq: Every day | ORAL | 0 refills | Status: DC

## 2021-06-11 MED ORDER — PANTOPRAZOLE SODIUM 40 MG PO TBEC: 40.0000 mg | DELAYED_RELEASE_TABLET | Freq: Every day | ORAL | 0 refills | Status: DC

## 2021-06-11 MED ORDER — GABAPENTIN 400 MG PO CAPS
400.0000 mg | ORAL_CAPSULE | Freq: Three times a day (TID) | ORAL | Status: DC
  Filled 2021-06-11 (×3): qty 21

## 2021-06-11 MED ORDER — HYDROXYZINE HCL 50 MG PO TABS: 50.0000 mg | ORAL_TABLET | Freq: Three times a day (TID) | ORAL | 0 refills | Status: DC | PRN

## 2021-06-11 MED ORDER — CARBAMAZEPINE 100 MG PO CHEW: 100.0000 mg | CHEWABLE_TABLET | Freq: Three times a day (TID) | ORAL | 0 refills | Status: DC

## 2021-06-11 NOTE — [facility] Group Notes (Signed)
[facility] LCSW Group Therapy Note  Date/Time:  06/11/2021 11:00-12:00 PM  Type of Therapy and Topic:  Group Therapy:  Healthy and Unhealthy Supports  Participation Level:  Active   Description of Group:  Patients in this group were introduced to the idea of adding a variety of healthy supports to address the various needs in their lives.Patients discussed what additional healthy supports could be helpful in their recovery and wellness after discharge in order to prevent future hospitalizations.   An emphasis was placed on using counselor, doctor, therapy groups, 12-step groups, and problem-specific support groups to expand supports.  They also worked as a group on developing a specific plan for several patients to deal with unhealthy supports through boundary-setting, psychoeducation with loved ones, and even termination of relationships.   Therapeutic Goals:   1)  discuss importance of adding supports to stay well once out of the hospital  2)  compare healthy versus unhealthy supports and identify some examples of each  3)  generate ideas and descriptions of healthy supports that can be added  4)  offer mutual support about how to address unhealthy supports  5)  encourage active participation in and adherence to discharge plan    Summary of Patient Progress:  The patient stated that current healthy supports in his life are family his while current unhealthy supports include some relatives.  The patient expressed a willingness to add NA and AA as support(s) to help in his recovery journey.   Therapeutic Modalities:   Motivational Interviewing Brief Solution-Focused Therapy  Evorn Gong

## 2021-06-11 NOTE — Plan of Care (Signed)
Discharge note  Patient verbalizes readiness for discharge. Follow up plan explained, AVS, Transition record and SRA given. Prescriptions and teaching provided. Belongings returned and signed for. Suicide safety plan completed and signed. Patient verbalizes understanding. Patient denies SI/HI and assures this Clinical research associate they will seek assistance should that change. Patient discharged to lobby where family was waiting.  Problem: Education: Goal: Ability to state activities that reduce stress will improve 06/11/2021 1500 by Raylene Miyamoto, RN Outcome: Adequate for Discharge 06/11/2021 0829 by Raylene Miyamoto, RN Outcome: Progressing   Problem: Coping: Goal: Ability to identify and develop effective coping behavior will improve 06/11/2021 1500 by Raylene Miyamoto, RN Outcome: Adequate for Discharge 06/11/2021 0829 by Raylene Miyamoto, RN Outcome: Progressing   Problem: Self-Concept: Goal: Ability to identify factors that promote anxiety will improve 06/11/2021 1500 by Raylene Miyamoto, RN Outcome: Adequate for Discharge 06/11/2021 0829 by Raylene Miyamoto, RN Outcome: Progressing Goal: Level of anxiety will decrease Outcome: Adequate for Discharge Goal: Ability to modify response to factors that promote anxiety will improve Outcome: Adequate for Discharge   Problem: Education: Goal: Utilization of techniques to improve thought processes will improve Outcome: Adequate for Discharge Goal: Knowledge of the prescribed therapeutic regimen will improve Outcome: Adequate for Discharge   Problem: Activity: Goal: Interest or engagement in leisure activities will improve Outcome: Adequate for Discharge Goal: Imbalance in normal sleep/wake cycle will improve Outcome: Adequate for Discharge   Problem: Coping: Goal: Coping ability will improve Outcome: Adequate for Discharge Goal: Will verbalize feelings Outcome: Adequate for Discharge   Problem: Health Behavior/Discharge  Planning: Goal: Ability to make decisions will improve Outcome: Adequate for Discharge Goal: Compliance with therapeutic regimen will improve Outcome: Adequate for Discharge   Problem: Role Relationship: Goal: Will demonstrate positive changes in social behaviors and relationships Outcome: Adequate for Discharge   Problem: Safety: Goal: Ability to disclose and discuss suicidal ideas will improve Outcome: Adequate for Discharge Goal: Ability to identify and utilize support systems that promote safety will improve Outcome: Adequate for Discharge   Problem: Self-Concept: Goal: Will verbalize positive feelings about self Outcome: Adequate for Discharge Goal: Level of anxiety will decrease Outcome: Adequate for Discharge   Problem: Education: Goal: Knowledge of [place] [facility] Education information/materials will improve Outcome: Adequate for Discharge Goal: Emotional status will improve Outcome: Adequate for Discharge Goal: Mental status will improve Outcome: Adequate for Discharge Goal: Verbalization of understanding the information provided will improve Outcome: Adequate for Discharge   Problem: Activity: Goal: Interest or engagement in activities will improve Outcome: Adequate for Discharge Goal: Sleeping patterns will improve Outcome: Adequate for Discharge   Problem: Coping: Goal: Ability to verbalize frustrations and anger appropriately will improve Outcome: Adequate for Discharge Goal: Ability to demonstrate self-control will improve Outcome: Adequate for Discharge   Problem: Health Behavior/Discharge Planning: Goal: Identification of resources available to assist in meeting health care needs will improve Outcome: Adequate for Discharge Goal: Compliance with treatment plan for underlying cause of condition will improve Outcome: Adequate for Discharge   Problem: Physical Regulation: Goal: Ability to maintain clinical measurements within normal limits will  improve Outcome: Adequate for Discharge   Problem: Safety: Goal: Periods of time without injury will increase Outcome: Adequate for Discharge   Problem: Activity: Goal: Will identify at least one activity in which they can participate Outcome: Adequate for Discharge   Problem: Coping: Goal: Ability to identify and develop effective coping behavior will improve Outcome: Adequate for Discharge Goal: Ability to interact with others  will improve Outcome: Adequate for Discharge Goal: Demonstration of participation in decision-making regarding own care will improve Outcome: Adequate for Discharge Goal: Ability to use eye contact when communicating with others will improve Outcome: Adequate for Discharge   Problem: Health Behavior/Discharge Planning: Goal: Identification of resources available to assist in meeting health care needs will improve Outcome: Adequate for Discharge   Problem: Self-Concept: Goal: Will verbalize positive feelings about self Outcome: Adequate for Discharge   Problem: Education: Goal: Ability to incorporate positive changes in behavior to improve self-esteem will improve Outcome: Adequate for Discharge   Problem: Health Behavior/Discharge Planning: Goal: Ability to identify and utilize available resources and services will improve Outcome: Adequate for Discharge Goal: Ability to remain free from injury will improve Outcome: Adequate for Discharge   Problem: Self-Concept: Goal: Will verbalize positive feelings about self Outcome: Adequate for Discharge   Problem: Skin Integrity: Goal: Demonstration of wound healing without infection will improve Outcome: Adequate for Discharge

## 2021-06-11 NOTE — Progress Notes (Signed)
  [facility] Adult Case Management Discharge Plan :  Will you be returning to the same living situation after discharge:  Yes,  with mother -- patient states they have "come to an agreement -- if she stops doing what she is doing, then I'll stop drinking."  He does not want to discuss what it is she needs to stop.  He states he needs to distract himself with other activities such as jogging instead of "turning to the bottle." At discharge, do you have transportation home?: Yes,  mother to pick up  (if she has gas money) Do you have the ability to pay for your medications: No.  Will need assistance from community organization  Release of information consent forms completed and emailed to Medical Records, then turned in to Medical Records by CSW.   Patient to Follow up at:  Follow-up Information     [facility] Follow up.   Specialty: Behavioral Health Why: Please go to this provider for therapy and medication management services. Walk in hours are Monday through Wednesday, 8:00 am to 11:00 am.  Services are available first come, first served. Contact information: [address] [place] [address] [phone]                Next level of care provider has access to [facility] Link:yes  Safety Planning and Suicide Prevention discussed: Yes,  with mother  Have you used any form of tobacco in the last 30 days? (Cigarettes, Smokeless Tobacco, Cigars, and/or Pipes): Yes  Has patient been referred to the Quitline?: Patient refused referral  Patient has been referred for addiction treatment: Yes  Lynnell Chad, LCSW 06/11/2021, 9:19 AM

## 2021-06-11 NOTE — Progress Notes (Signed)
   06/11/21 0214  Psych Admission Type (Psych Patients Only)  Admission Status Voluntary  Psychosocial Assessment  Patient Complaints Anxiety  Eye Contact Fair  Facial Expression Flat  Affect Appropriate to circumstance  Speech Logical/coherent  Interaction Assertive  Motor Activity Restless  Appearance/Hygiene Unremarkable  Behavior Characteristics Cooperative  Mood Depressed  Thought Process  Coherency WDL  Content WDL  Delusions WDL  Perception WDL  Hallucination None reported or observed  Judgment WDL  Confusion WDL  Danger to Self  Current suicidal ideation? Denies  Danger to Others  Danger to Others None reported or observed

## 2021-06-11 NOTE — BHH Suicide Risk Assessment (Signed)
[facility] Discharge Suicide Risk Assessment   Principal Problem: <principal problem not specified> Discharge Diagnoses: Active Problems:   MDD (major depressive disorder), recurrent episode, severe (HCC)   Total Time spent with patient: 15 minutes  Musculoskeletal: Strength & Muscle Tone: within normal limits Gait & Station: normal Patient leans: N/A  Psychiatric Specialty Exam  Presentation  General Appearance: Disheveled  Eye Contact:Fair  Speech:Normal Rate  Speech Volume:Decreased  Handedness:Right   Mood and Affect  Mood:Dysphoric  Duration of Depression Symptoms: Greater than two weeks  Affect:Congruent   Thought Process  Thought Processes:Coherent  Descriptions of Associations:Intact  Orientation:Full (Time, Place and Person)  Thought Content:Logical  History of Schizophrenia/Schizoaffective disorder:No  Duration of Psychotic Symptoms:No data recorded Hallucinations:No data recorded Ideas of Reference:None  Suicidal Thoughts:No data recorded Homicidal Thoughts:No data recorded  Sensorium  Memory:Immediate Fair; Recent Fair; Remote Fair  Judgment:Impaired  Insight:Lacking   Executive Functions  Concentration:Good  Attention Span:Good  Recall:Good  Fund of Knowledge:Good  Language:Good   Psychomotor Activity  Psychomotor Activity: No data recorded  Assets  Assets:Desire for Improvement; Resilience; Social Support   Sleep  Sleep: No data recorded  Physical Exam: Physical Exam Vitals and nursing note reviewed.  Constitutional:      Appearance: Normal appearance.  HENT:     Head: Normocephalic and atraumatic.  Pulmonary:     Effort: Pulmonary effort is normal.  Neurological:     General: No focal deficit present.     Mental Status: He is alert and oriented to person, place, and time.   Review of Systems  All other systems reviewed and are negative. Blood pressure 94/73, pulse 76, temperature (!) 97.4 F (36.3 C),  temperature source Oral, resp. rate 14, height 6' (1.829 m), weight 70.3 kg, SpO2 100 %. Body mass index is 21.02 kg/m.  Mental Status Per Nursing Assessment::   On Admission:  NA  Demographic Factors:  Male, Caucasian, Low socioeconomic status, and Unemployed  Loss Factors: Decrease in vocational status  Historical Factors: Prior suicide attempts and Impulsivity  Risk Reduction Factors:   Positive social support  Continued Clinical Symptoms:  Depression:   Comorbid alcohol abuse/dependence Impulsivity Alcohol/Substance Abuse/Dependencies  Cognitive Features That Contribute To Risk:  Thought constriction (tunnel vision)    Suicide Risk:  Minimal: No identifiable suicidal ideation.  Patients presenting with no risk factors but with morbid ruminations; may be classified as minimal risk based on the severity of the depressive symptoms   Follow-up Information     [facility] Follow up.   Specialty: Behavioral Health Why: Please go to this provider for therapy and medication management services. Walk in hours are Monday through Wednesday, 8:00 am to 11:00 am.  Services are available first come, first served. Contact information: [address] [place] [address] [phone]                Plan Of Care/Follow-up recommendations:  Activity:  ad lib  Antonieta Pert, MD 06/11/2021, 7:43 AM

## 2021-06-11 NOTE — Discharge Summary (Signed)
Physician Discharge Summary Note  Patient:  John Vaughan is an 30 y.o., male MRN:  [id number] DOB:  08/31/1991 Patient phone:  [phone] (home)  Patient address:   [address] [place] [id number],  Total Time spent with patient:  Greater than 30 minutes  Date of Admission:  06/06/2021 Date of Discharge: 06/11/2021  Reason for Admission: Suicidal ideations & worsening alcohol use.     Principal Problem: MDD (major depressive disorder), recurrent episode, severe (HCC) Discharge Diagnoses: Principal Problem:   MDD (major depressive disorder), recurrent episode, severe (HCC) Active Problems:   Alcohol use disorder, severe, dependence (HCC)   Past Psychiatric History: See H&P  Past Medical History:  Past Medical History:  Diagnosis Date   Alcohol abuse    Anxiety    Depression    History reviewed. No pertinent surgical history. Family History:  Family History  Problem Relation Age of Onset   Anxiety disorder Mother    Alcohol abuse Father    Anxiety disorder Maternal Aunt    Anxiety disorder Maternal Grandmother    Alcohol abuse Paternal Grandfather    Family Psychiatric  History: See H&P Social History:  Social History   Substance and Sexual Activity  Alcohol Use Yes   Alcohol/week: 2.0 standard drinks   Types: 2 Cans of beer per week   Comment: states that he drinks 2 beers daily     Social History   Substance and Sexual Activity  Drug Use Not Currently   Types: Marijuana, Cocaine   Comment: cocaine in last month    Social History   Socioeconomic History   Marital status: Single    Spouse name: Not on file   Number of children: Not on file   Years of education: Not on file   Highest education level: Not on file  Occupational History   Not on file  Tobacco Use   Smoking status: Every Day    Packs/day: 1.50    Pack years: 0.00    Types: Cigarettes   Smokeless tobacco: Former    Types: Snuff  Vaping Use   Vaping Use: Never  used  Substance and Sexual Activity   Alcohol use: Yes    Alcohol/week: 2.0 standard drinks    Types: 2 Cans of beer per week    Comment: states that he drinks 2 beers daily   Drug use: Not Currently    Types: Marijuana, Cocaine    Comment: cocaine in last month   Sexual activity: Not Currently  Other Topics Concern   Not on file  Social History Narrative   Not on file   Social Determinants of Health   Financial Resource Strain: Not on file  Food Insecurity: Not on file  Transportation Needs: Not on file  Physical Activity: Not on file  Stress: Not on file  Social Connections: Not on file   Hospital Course:  (Per Md's admission evaluation notes): Patient is a 30 year old male familiar to me from a previous psychiatric hospitalization approximately 2 weeks ago who [facility] on 06/05/2021 secondary to suicidal ideation and alcohol abuse.  The patient had [facility] [place] [facility] [place].  This was reportedly for homicidal and suicidal ideation.  [place] [facility] [place] the patient's mother had reached out because the patient was threatening to harm her as well as patient cutting on himself.  The patient was aggressive and emotionally labile towards  EMS as well as [employer].  They required restraint.  He admitted to having a great deal of stress and admitted to self-injurious behavior at that time.  He denied wanting to harm his mother.  He admitted that he was self-medicating with drugs and alcohol.  He stated his last use was the night prior to admission.  He was seen by the clinical comprehensive team, and the patient admitted to self-injurious behavior with a skill saw.  He reported multiple attempts to harm himself in the past including his ongoing issues with substance abuse.  He reported the last use of substances was approximately 12 hours prior to the  evaluation.  He stated at that time "I was just high".  The patient had a recent admission to the hospital on 05/25/2021.  His mother involuntarily committed him at that time as well.  He reportedly had put a gun to his head.  In the initial part of the evaluation at that time he stated that he was "just cleaning the gun, it is a big misunderstanding".  Later in the hospitalization he admitted to thinking about harming himself.  On evaluation in the unit today he stated that the injury to his arm was "an accident" and denied suicidal ideation once again.  His blood alcohol on admission was 261, drug screen was positive for benzodiazepines as well as cocaine.  During the course of his first hospitalization here he admitted that he had prescriptions for clonazepam at home, and intended to take those.  He was admitted to the hospital for evaluation and stabilization.   Prior to this discharge, Myquan was seen & evaluated for mental health stability. The current laboratory findings were reviewed (stable), nurses notes & vital signs were reviewed as well. There are no current mental health or medical issues that should prevent this discharge at this time. Patient is being discharged to continue mental health care as noted below.   After the above admission evaluation, it was recommended based on his presenting symptoms that Latavion will benefit from alcohol/benzodiazepine detoxification & mood stabilization treatment. He was admitted to the hospital with aBAL of 261 & UDS was (+) for Benzodiazepine & cocaine. And with his consent, he was started on the medication regimen that targeted his presenting symptoms. He received alcohol/benzodiazepine detox using Librium regimen on a prn basis, He was instructed & explained the benefit/adverse effects of the medication in use. He was given the time to ask questions and voice any concerns that he may have. He received, stabilized & was discharged on the medications as listed below  on his discharge medication lists. He was also enrolled & participated in the group counseling sessions being offered and held on this unit. He learned coping skills that should help him after discharge to cope better & maintain mood stability/sobriety.  Jayvien's symptoms responded well to his treatment regimen warranting this discharge. This is evidenced by his reports of improved mood, absence of suicidal ideations, homicidal ideations, AV hallucinations or substance withdrawal symptoms. He is currently mentally & medically stable to be discharged to continue mental health care, medication management & substance abuse treatment as noted below.   During the course of his hospitalization, the 15-minute checks were adequate to ensure patient's safety. Patient did not display any dangerous, violent or suicidal behavior on the unit.  He interacted with patients & staff appropriately and participated appropriately in the group therapy sessions.  His medications were addressed & adjusted to meet his needs.  At  the time of discharge patient is not reporting any acute suicidal ideation & feels more confident about his self-care and managing suicidal thoughts/mental health.  Denies homicidal ideations.  Education and supportive counseling provided. He was able to engage in safety planning including plan to return to [facility] or contact emergency services if he feels unable to maintain his own safety or the safety of others. Pt had no further questions, comments, or concerns. He left [facility] with all personal belongings in no apparent distress. Transportation per his mother.   Physical Findings: AIMS: Facial and Oral Movements Muscles of Facial Expression: None, normal Lips and Perioral Area: None, normal Jaw: None, normal Tongue: None, normal,Extremity Movements Upper (arms, wrists, hands, fingers): None, normal Lower (legs, knees, ankles, toes): None, normal, Trunk Movements Neck, shoulders, hips: None, normal, Overall  Severity Severity of abnormal movements (highest score from questions above): None, normal Incapacitation due to abnormal movements: None, normal Patient's awareness of abnormal movements (rate only patient's report): No Awareness, Dental Status Current problems with teeth and/or dentures?: No Does patient usually wear dentures?: No  CIWA:  CIWA-Ar Total: 8 COWS:     Musculoskeletal: Strength & Muscle Tone: within normal limits Gait & Station: normal Patient leans: N/A   Psychiatric Specialty Exam:  Presentation  General Appearance: Fairly Groomed  Eye Contact:Fair  Speech:Normal Rate  Speech Volume:Normal  Handedness:Right  Mood and Affect  Mood:Euthymic  Affect:Congruent  Thought Process  Thought Processes:Coherent  Descriptions of Associations:Intact  Orientation:Full (Time, Place and Person)  Thought Content:Logical  History of Schizophrenia/Schizoaffective disorder:No  Duration of Psychotic Symptoms:No data recorded Hallucinations:Hallucinations: None  Ideas of Reference:None  Suicidal Thoughts:Suicidal Thoughts: No  Homicidal Thoughts:Homicidal Thoughts: No  Sensorium  Memory:Immediate Fair; Recent Fair; Remote Fair  Judgment:Fair  Insight:Lacking  Executive Functions  Concentration:Fair  Attention Span:Fair  Recall:Fair  Fund of Knowledge:Fair  Language:Fair  Psychomotor Activity  Psychomotor Activity:Psychomotor Activity: Normal  Assets  Assets:Desire for Improvement; Resilience; Social Support; Housing  Sleep  Sleep:Sleep: Good Number of Hours of Sleep: 6.5  Physical Exam: Physical Exam Vitals and nursing note reviewed.  Constitutional:      Appearance: Normal appearance.  HENT:     Head: Normocephalic.  Pulmonary:     Effort: Pulmonary effort is normal.  Musculoskeletal:        General: Normal range of motion.     Cervical back: Normal range of motion.  Neurological:     General: No focal deficit present.      Mental Status: He is alert and oriented to person, place, and time.  Psychiatric:        Attention and Perception: Attention normal. He does not perceive auditory or visual hallucinations.        Mood and Affect: Mood normal.        Speech: Speech normal.        Behavior: Behavior normal. Behavior is cooperative.        Thought Content: Thought content normal. Thought content is not paranoid or delusional. Thought content does not include homicidal or suicidal ideation. Thought content does not include homicidal or suicidal plan.        Cognition and Memory: Cognition normal.   Review of Systems  Constitutional:  Negative for fever.  HENT:  Negative for congestion, sinus pain and sore throat.   Respiratory:  Negative for cough, shortness of breath and wheezing.   Cardiovascular: Negative.   Gastrointestinal: Negative.   Genitourinary: Negative.   Musculoskeletal: Negative.   Neurological: Negative.  Blood pressure 94/73, pulse 76, temperature (!) 97.4 F (36.3 C), temperature source Oral, resp. rate 14, height 6' (1.829 m), weight 70.3 kg, SpO2 100 %. Body mass index is 21.02 kg/m.   Have you used any form of tobacco in the last 30 days? (Cigarettes, Smokeless Tobacco, Cigars, and/or Pipes): Yes  Has this patient used any form of tobacco in the last 30 days? (Cigarettes, Smokeless Tobacco, Cigars, and/or Pipes) Yes, N/A  Blood Alcohol level:  Lab Results  Component Value Date   ETH 261 (H) 06/05/2021   ETH 279 (H) 05/24/2021    Metabolic Disorder Labs:  No results found for: HGBA1C, MPG No results found for: PROLACTIN No results found for: CHOL, TRIG, HDL, CHOLHDL, VLDL, LDLCALC  See Psychiatric Specialty Exam and Suicide Risk Assessment completed by Attending Physician prior to discharge.  Discharge destination:  Home  Is patient on multiple antipsychotic therapies at discharge:  No   Has Patient had three or more failed trials of antipsychotic monotherapy by history:   No  Recommended Plan for Multiple Antipsychotic Therapies: NA  Discharge Instructions     Diet - low sodium heart healthy   Complete by: As directed    Increase activity slowly   Complete by: As directed       Allergies as of 06/11/2021   No Known Allergies      Medication List     STOP taking these medications    bismuth subsalicylate 262 MG/15ML suspension Commonly known as: PEPTO BISMOL   Multivitamin Adults Tabs       TAKE these medications      Indication  carbamazepine 100 MG chewable tablet Commonly known as: TEGRETOL Chew 1 tablet (100 mg total) by mouth 3 (three) times daily.  Indication: Manic-Depression, seizure prevention   dicyclomine 10 MG capsule Commonly known as: BENTYL Take 1 capsule (10 mg total) by mouth 4 (four) times daily -  before meals and at bedtime.  Indication: Irritable Bowel Syndrome   gabapentin 400 MG capsule Commonly known as: NEURONTIN Take 1 capsule (400 mg total) by mouth 3 (three) times daily.  Indication: Abuse or Misuse of Alcohol   hydrOXYzine 50 MG tablet Commonly known as: ATARAX/VISTARIL Take 1 tablet (50 mg total) by mouth 3 (three) times daily as needed for anxiety.  Indication: Feeling Anxious   pantoprazole 40 MG tablet Commonly known as: PROTONIX Take 1 tablet (40 mg total) by mouth daily. Start taking on: June 12, 2021  Indication: Gastroesophageal Reflux Disease   sertraline 50 MG tablet Commonly known as: ZOLOFT Take 2 tablets (100 mg total) by mouth daily.  Indication: Generalized Anxiety Disorder, Major Depressive Disorder   traZODone 100 MG tablet Commonly known as: DESYREL Take 1 tablet (100 mg total) by mouth at bedtime as needed for sleep.  Indication: Trouble Sleeping        Follow-up Information     [facility] Follow up.   Specialty: Behavioral Health Why: Please go to this provider for therapy and medication management services. Walk in hours are  Monday through Wednesday, 8:00 am to 11:00 am.  Services are available first come, first served. Contact information: [address] [place] [address] [phone]               Follow-up recommendations: Activity:  As tolerated Diet: As recommended by your primary care doctor. Keep all scheduled follow-up appointments as recommended.   Comments:  Prescriptions were given at discharge.  Patient is agreeable  to plan.  He was given the opportunity to ask questions.  He appears to feel comfortable with discharge and denies any current suicidal or homicidal thoughts.  Patient is instructed prior to discharge to: Take all medications as prescribed by his mental healthcare provider. Report any adverse effects and or reactions from the medicines to his outpatient provider promptly. Patient has been instructed & cautioned: To not engage in alcohol and or illegal drug use while on prescription medicines. In the event of worsening symptoms, patient is instructed to call the crisis hotline, 911 and or go to the nearest ED for appropriate evaluation and treatment of symptoms. To follow-up with his primary care provider for your other medical issues, concerns and or health care needs.  Signed: Armandina Stammer, NP, PMHNP, FNP-BC 06/11/2021, 10:23 AM

## 2021-06-11 NOTE — Plan of Care (Signed)
  Problem: Education: Goal: Ability to state activities that reduce stress will improve Outcome: Progressing   Problem: Coping: Goal: Ability to identify and develop effective coping behavior will improve Outcome: Progressing   Problem: Self-Concept: Goal: Ability to identify factors that promote anxiety will improve Outcome: Progressing   

## 2021-07-12 ENCOUNTER — Ambulatory Visit: Payer: Self-pay | Admitting: Family Medicine

## 2021-09-04 ENCOUNTER — Ambulatory Visit (HOSPITAL_COMMUNITY)
Admission: RE | Admit: 2021-09-04 | Discharge: 2021-09-04 | Disposition: A | Discharge status: Home / Self Care | Payer: Self-pay | Attending: Psychiatry | Admitting: Psychiatry

## 2021-09-04 DIAGNOSIS — F1020 Alcohol dependence, uncomplicated: Secondary | ICD-10-CM | POA: Insufficient documentation

## 2021-09-04 DIAGNOSIS — F331 Major depressive disorder, recurrent, moderate: Secondary | ICD-10-CM | POA: Insufficient documentation

## 2021-09-04 NOTE — BH Assessment (Signed)
Comprehensive Clinical Assessment (CCA) Note  09/04/2021 John Vaughan [id number]  DISPOSITION: Clementeen Hoof NP recommends patient be discharged with resources. To note patient was offered a inpatient admission to assist with stabilization although patient declined     Flowsheet Row [facility] Admission (Discharged) [facility] from 06/05/2021 in [facility]  C-SSRS RISK CATEGORY No Risk No Risk High Risk     The patient demonstrates the following risk factors for suicide: Chronic risk factors for suicide include: N/A. Acute risk factors for suicide include: N/A. Protective factors for this patient include: coping skills. Considering these factors, the overall suicide risk at this point appears to be low. Patient is appropriate for outpatient follow up.    Patient is a 30 year old male that presents voluntary to [facility] as a walk in. Patient initially voiced passive S/I although denied any plan or intent. Patient denies any H/I or AVH. Patient is requesting a admission to assist with ongoing SA issues and after [facility] NP evaluated patient recommended a inpatient admission although due to patient's history of seizures he needed to be transported to [facility] for medical clearance. Patient per chart review, has been seen multiple times presenting with similar symptoms and was last admitted from 06/05/21-06/11/21 to assist with ongoing alcohol abuse and depression/anxiety. Patient was scheduled to follow up with OP care to assist with continued medication management and therapy although patient this date stated he "didn't have a ride to get medication/s." Patient renders conflicting history this date during the assessment first stating he didn't have transportation to follow up with OP care then  stated he didn't have finances to obtain medications and later during the  interview stated he could not leave his residence since he had to "take care of his mother." Patient reports daily use of alcohol since he was discharged stating he "drinks a lot of beer everyday" although is vague in reference to amounts consumed and time frame. Patient also reports sporadic cocaine use although again is vague in reference to use patterns. Patient did report he "had a few beers" prior to arrival this date. Dolby NP and this Clinical research associate spoke with patient at length in reference to the importance of following up with OP care although patient was not receptive even when offered assistance with transportation to obtain medications etc after discharge. Patient agreed but was reluctant to be transported to [facility] for medical clearance. Patient requested food and was provided with a meal. Soon thereafter when [facility] arrived to transport patient he then requested to be discharged because he "didn't want to have to wait in the ED or be put in a hall bed like they always do." Patient denied any S/I, H/I or AVH. Patient is contacting for safety and states he "just came here to rest" and is not interested in "anything else." Case was staffed with Clementeen Hoof NP who recommended patient be discharged with resources which patient declined. Patient was discharged to lobby.         Chief Complaint:  Chief Complaint  Patient presents with   Psychiatric Evaluation   Visit Diagnosis: Alcohol abuse    CCA Screening, Triage and Referral (STR)  Patient Reported Information How did you hear about [place]? Self  What Is the Reason for Your Visit/Call Today? Ongoing ETOH use and passive S/I  How Long Has This Been Causing You Problems? > than 6 months  What Do You Feel Would Help You the Most Today? Alcohol or Drug Use Treatment   Have You Recently Had Any Thoughts About Hurting Yourself? Yes  Are You Planning to Commit Suicide/Harm Yourself At This time? No   Have you Recently Had Thoughts About Hurting  Someone [name]? No  Are You Planning to Harm Someone at This Time? No  Explanation: No data recorded  Have You Used Any Alcohol or Drugs in the Past 24 Hours? Yes  How Long Ago Did You Use Drugs or Alcohol? No data recorded What Did You Use and How Much? Pt states "a few beers" about 3 hours ago prior to arrival   Do You Currently Have a Therapist/Psychiatrist? No  Name of Therapist/Psychiatrist: No data recorded  Have You Been Recently Discharged From Any Office Practice or Programs? No  Explanation of Discharge From Practice/Program: No data recorded    CCA Screening Triage Referral Assessment Type of Contact: Face-to-Face  Telemedicine Service Delivery:   Is this Initial or Reassessment? No data recorded Date Telepsych consult ordered in [facility]:  No data recorded Time Telepsych consult ordered in [facility]:  No data recorded Location of Assessment: [facility]  Provider Location: [facility]   Collateral Involvement: None at this time   Does Patient Have a Court Appointed Legal Guardian? No data recorded Name and Contact of Legal Guardian: No data recorded If Minor and Not Living with Parent(s), Who has Custody? NA  Is CPS involved or ever been involved? Never  Is APS involved or ever been involved? Never   Patient Determined To Be At Risk for Harm To Self or Others Based on Review of Patient Reported Information or Presenting Complaint? Yes, for Self-Harm  Method: No data recorded Availability of Means: No data recorded Intent: No data recorded Notification Required: No data recorded Additional Information for Danger to Others Potential: No data recorded Additional Comments for Danger to Others Potential: No data recorded Are There Guns or Other Weapons in Your Home? No data recorded Types of Guns/Weapons: No data recorded Are These Weapons Safely Secured?                            No data recorded Who Could Verify You Are Able To Have  These Secured: No data recorded Do You Have any Outstanding Charges, Pending Court Dates, Parole/Probation? No data recorded Contacted To Inform of Risk of Harm To Self or Others: Other: Comment (NA)    Does Patient Present under Involuntary Commitment? No  [name] Papers Initial File Date: 06/05/21   [place] of Residence: [place]   Patient Currently Receiving the Following Services: Not Receiving Services   Determination of Need: Urgent (48 hours)   Options For Referral: Outpatient Therapy     CCA Biopsychosocial Patient Reported Schizophrenia/Schizoaffective Diagnosis in Past: No   Strengths: Pt is willing to participate in treatment   Mental Health Symptoms Depression:   Difficulty Concentrating; Irritability; Change in energy/activity   Duration of Depressive symptoms:  Duration of Depressive Symptoms: Greater than two weeks   Mania:   None   Anxiety:    Difficulty concentrating   Psychosis:   None   Duration of Psychotic symptoms:    Trauma:   None   Obsessions:   None   Compulsions:   None   Inattention:   None   Hyperactivity/Impulsivity:   None   Oppositional/Defiant Behaviors:   None   Emotional Irregularity:  Chronic feelings of emptiness   Other Mood/Personality Symptoms:   NA    Mental Status Exam Appearance and self-care  Stature:   Average   Weight:   Average weight   Clothing:   Disheveled   Grooming:   Neglected   Cosmetic use:   None   Posture/gait:   Tense   Motor activity:   Restless; Slowed   Sensorium  Attention:   Distractible   Concentration:   Preoccupied; Scattered   Orientation:   X5   Recall/memory:   Normal   Affect and Mood  Affect:   Congruent; Depressed   Mood:   Anxious; Depressed   Relating  Eye contact:   Fleeting   Facial expression:   Anxious; Depressed   Attitude toward examiner:   Cooperative   Thought and Language  Speech flow:  Slow   Thought content:    Appropriate to Mood and Circumstances   Preoccupation:   None   Hallucinations:   None   Organization:  No data recorded  Affiliated Computer ServicesExecutive Functions  Fund of Knowledge:   Poor   Intelligence:   Average   Abstraction:   Normal   Judgement:   Poor   Reality Testing:   Realistic   Insight:   Poor   Decision Making:   Confused   Social Functioning  Social Maturity:   Responsible   Social Judgement:   Normal   Stress  Stressors:   Family conflict   Coping Ability:   Contractorxhausted   Skill Deficits:   None   Supports:   Usual     Religion: Religion/Spirituality Are You A Religious Person?: No  Leisure/Recreation: Leisure / Recreation Do You Have Hobbies?: No  Exercise/Diet: Exercise/Diet Do You Exercise?: No Have You Gained or Lost A Significant Amount of Weight in the Past Six Months?: No Do You Follow a Special Diet?: No Do You Have Any Trouble Sleeping?: No   CCA Employment/Education Employment/Work Situation: Employment / Work Situation Employment Situation: Unemployed Patient's Job has Been Impacted by Current Illness: Yes Has Patient ever Been in Equities traderthe Military?: No  Education: Education Last Grade Completed: 12 Did You Product managerAttend College?: No Did You Have An Individualized Education Program (IIEP):  (Not assessed) Did You Have Any Difficulty At Progress EnergySchool?:  (Not assessed)   CCA Family/Childhood History Family and Relationship History: Family history Does patient have children?: Yes How many children?: 1 How is patient's relationship with their children?: Limited contact  Childhood History:  Childhood History By whom was/is the patient raised?: Both parents Did patient suffer any verbal/emotional/physical/sexual abuse as a child?: No Has patient ever been sexually abused/assaulted/raped as an adolescent or adult?: No Witnessed domestic violence?: No Has patient been affected by domestic violence as an adult?: No  Child/Adolescent  Assessment:     CCA Substance Use Alcohol/Drug Use: Alcohol / Drug Use Pain Medications: See MAR Prescriptions: See MAR Over the Counter: See MAR History of alcohol / drug use?: Yes Longest period of sobriety (when/how long): UTA 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 Use: Legal, Personal relationships Withdrawal Symptoms: Nausea / Vomiting, Seizures, Sweats, Tingling Onset of Seizures: Pt reports ongoing seizures Date of most recent seizure: Pt states he had a seizure 24 hours ago Substance #1 Name of Substance 1: Alcohol 1 - Age of First Use: 16 1 - Amount (size/oz): Varies 1 - Frequency: Varies 1 - Duration: Ongoing 1 - Last Use / Amount: Prior to arrival pt states "a few beers"  ASAM's:  Six Dimensions of Multidimensional Assessment  Dimension 1:  Acute Intoxication and/or Withdrawal Potential:   Dimension 1:  Description of individual's past and current experiences of substance use and withdrawal: Pt has experienced seizures in the past  Dimension 2:  Biomedical Conditions and Complications:   Dimension 2:  Description of patient's biomedical conditions and  complications: Pt has been dx with chirrosis of the liver  Dimension 3:  Emotional, Behavioral, or Cognitive Conditions and Complications:  Dimension 3:  Description of emotional, behavioral, or cognitive conditions and complications: Pt has depression that he is not being treated for  Dimension 4:  Readiness to Change:  Dimension 4:  Description of Readiness to Change criteria: Pt has no desire to stop drinking  Dimension 5:  Relapse, Continued use, or Continued Problem Potential:  Dimension 5:  Relapse, continued use, or continued problem potential critiera description: Pt has no desire to stop drinking  Dimension 6:  Recovery/Living Environment:  Dimension 6:  Recovery/Iiving environment criteria description: Per LEO, pt's mother (and deceased father) also abuse/abused substances  ASAM Severity Score:  ASAM's Severity Rating Score: 15  ASAM Recommended Level of Treatment: ASAM Recommended Level of Treatment: Level III Residential Treatment   Substance use Disorder (SUD) Substance Use Disorder (SUD)  Checklist Symptoms of Substance Use: Continued use despite having a persistent/recurrent physical/psychological problem caused/exacerbated by use, Evidence of tolerance, Evidence of withdrawal (Comment), Persistent desire or unsuccessful efforts to cut down or control use, Recurrent use that results in a failure to fulfill major role obligations (work, school, home), Substance(s) often taken in larger amounts or over longer times than was intended  Recommendations for Services/Supports/Treatments: Recommendations for Services/Supports/Treatments Recommendations For Services/Supports/Treatments: Individual Therapy, Medication Management, Inpatient Hospitalization  Discharge Disposition:    DSM5 Diagnoses: Patient Active Problem List   Diagnosis Date Noted   MDD (major depressive disorder), recurrent episode, severe (HCC) 06/06/2021   Suicidal ideations 05/24/2021   MDD (major depressive disorder), recurrent severe, without psychosis (HCC) 05/24/2021   Depression 03/20/2021   Alcohol withdrawal (HCC) 09/06/2020   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

## 2021-09-04 NOTE — H&P (Addendum)
Behavioral Health Medical Screening Exam  John Vaughan is a 30 y.o. male seen by this provider and TTS counselor as voluntary walk-in at [facility]. Reports:  "I have a long history, suicidal thoughts, drugs, alcohol use" Discharged on 06/11/21 from [facility] and relapsed the same day. Unable to go to follow-up appointments for medication management due to a multitude of reasons. Reports he needs detox, thought he could come here today for inpatient psychiatric care. Last used alcohol 2 hours ago, last used cocaine 4 hours ago. Visibly shaking, reports seizure activity on 09/02/21 "nothing bad though". Endorses suicidal ideation with vague plan, "I am not sure what I would do".  Endorses auditory hallucinations and does appear to be responding to internal stimuli.  Discussion around needing long-term treatment, however, his mother needs care and he is her caregiver. Patient has excuses for not doing what the recommended treatment for his situation.  He has legal charges pending for DWI and failure to appear. Court date unknown to this Clinical research associate.   Has been off of medications since discharge on 6/19. Did not follow-up as instructed at discharge. Eats only once or twice per week per report. Lives with mother, who he provides care for her. He appears to have not bathed in some time, has odor and unclean appearance. She has IC. Patient has history of depression, anxiety, bipolar, alcohol and substance use disorder.   Total Time spent with patient: 30 minutes  Psychiatric Specialty Exam:  Presentation  General Appearance: Disheveled  Eye Contact:Fair  Speech:Normal Rate  Speech Volume:Normal  Handedness:Right   Mood and Affect  Mood:Anxious; Depressed; Irritable  Affect:Congruent   Thought Process  Thought Processes:Coherent  Descriptions of Associations:Intact  Orientation:Full (Time, Place and Person)  Thought Content:Logical  History of Schizophrenia/Schizoaffective disorder:No  Duration  of Psychotic Symptoms:No data recorded Hallucinations:Hallucinations: Auditory Description of Auditory Hallucinations: voices tell him to get up and move around Ideas of Reference:None  Suicidal Thoughts:Suicidal Thoughts: Yes, Passive SI Passive Intent and/or Plan: With Intent; Without Plan Homicidal Thoughts:Homicidal Thoughts: No  Sensorium  Memory:Immediate Fair; Recent Fair; Remote Fair  Judgment:Fair  Insight:Lacking   Executive Functions  Concentration:Fair  Attention Span:Fair  Recall:Fair  Fund of Knowledge:Fair  Language:Fair   Psychomotor Activity  Psychomotor Activity: Psychomotor Activity: Normal  Assets  Assets:Desire for Improvement; Physical Health; Resilience; Transportation; Housing   Sleep  Sleep: Sleep: Fair   Physical Exam: Physical Exam Vitals reviewed.  Cardiovascular:     Rate and Rhythm: Normal rate.  Pulmonary:     Effort: Pulmonary effort is normal.  Skin:    General: Skin is warm and dry.  Neurological:     Mental Status: He is alert and oriented to person, place, and time.  Psychiatric:        Attention and Perception: He perceives auditory hallucinations.        Mood and Affect: Mood is anxious and depressed. Affect is tearful.        Behavior: Behavior is agitated.        Thought Content: Thought content is not paranoid or delusional. Thought content includes suicidal ideation. Thought content does not include homicidal ideation. Thought content includes suicidal (vague) plan. Thought content does not include homicidal plan.   Review of Systems  Constitutional:  Positive for chills and weight loss. Negative for fever.  Respiratory:  Positive for shortness of breath.   Cardiovascular:  Positive for chest pain.  Gastrointestinal:  Positive for abdominal pain, diarrhea, nausea and vomiting.  Neurological:  Positive  for tremors and seizures (last on 09/02/21 per patient report). Negative for dizziness, weakness and headaches.   Psychiatric/Behavioral:  Positive for depression, hallucinations, substance abuse and suicidal ideas. The patient is nervous/anxious.   Blood pressure 114/89, pulse 91, temperature 98.6 F (37 C), temperature source Oral, resp. rate 16, SpO2 100 %. There is no height or weight on file to calculate BMI.  Musculoskeletal: Strength & Muscle Tone: within normal limits Gait & Station: normal Patient leans: N/A   Recommendations:  Based on my evaluation the patient appears to have an emergency medical condition for which I recommend the patient be transferred to the emergency department for further evaluation. Patient reports last seizure activity on 9/10, last ETOH and cocaine use 2 hours ago. Once medically cleared, patient may meet criteria for facility based crisis unit per MD/NP discretion. EMTALA form completed. EDP and Charge nurse notified via secure chat of patient needing medical clearance before further treatment.  Update: After patient received meal and waiting for safe transport, he reported he did not want to go to the emergency room and wait in hallway. He now denies suicidal ideation without intent and plan. Declines resources. Contracts for safety. Patient is discharged home.     Novella Olive, NP 09/04/2021, 10:40 AM

## 2021-10-23 ENCOUNTER — Emergency Department (HOSPITAL_COMMUNITY)
Admission: EM | Admit: 2021-10-23 | Discharge: 2021-10-24 | Disposition: A | Discharge status: Home / Self Care | Payer: Self-pay | Attending: Emergency Medicine | Admitting: Emergency Medicine

## 2021-10-23 ENCOUNTER — Emergency Department (HOSPITAL_COMMUNITY): Payer: Self-pay

## 2021-10-23 ENCOUNTER — Other Ambulatory Visit: Payer: Self-pay

## 2021-10-23 DIAGNOSIS — S299XXA Unspecified injury of thorax, initial encounter: Secondary | ICD-10-CM | POA: Insufficient documentation

## 2021-10-23 DIAGNOSIS — F17210 Nicotine dependence, cigarettes, uncomplicated: Secondary | ICD-10-CM | POA: Insufficient documentation

## 2021-10-23 DIAGNOSIS — R0781 Pleurodynia: Secondary | ICD-10-CM

## 2021-10-23 DIAGNOSIS — W109XXA Fall (on) (from) unspecified stairs and steps, initial encounter: Secondary | ICD-10-CM | POA: Insufficient documentation

## 2021-10-23 NOTE — ED Triage Notes (Signed)
Pt BIB [facility] with complaints of rib pain from falling down his stairs this morning. Pt reports pain to his right ribs.

## 2021-10-24 ENCOUNTER — Encounter (HOSPITAL_COMMUNITY): Payer: Self-pay

## 2021-10-24 ENCOUNTER — Inpatient Hospital Stay (HOSPITAL_COMMUNITY)
Admission: AD | Admit: 2021-10-24 | Discharge: 2021-11-02 | DRG: 885 | Disposition: A | Discharge status: Home / Self Care | Payer: Federal, State, Local not specified - Other | Source: Intra-hospital | Attending: Psychiatry | Admitting: Psychiatry

## 2021-10-24 ENCOUNTER — Other Ambulatory Visit: Payer: Self-pay

## 2021-10-24 ENCOUNTER — Emergency Department (HOSPITAL_COMMUNITY)
Admission: EM | Admit: 2021-10-24 | Discharge: 2021-10-24 | Disposition: A | Discharge status: Other Acute Inpatient Hospital | Payer: Self-pay | Attending: Emergency Medicine | Admitting: Emergency Medicine

## 2021-10-24 ENCOUNTER — Encounter (HOSPITAL_COMMUNITY): Payer: Self-pay | Admitting: Family Medicine

## 2021-10-24 DIAGNOSIS — W108XXA Fall (on) (from) other stairs and steps, initial encounter: Secondary | ICD-10-CM | POA: Insufficient documentation

## 2021-10-24 DIAGNOSIS — R45851 Suicidal ideations: Secondary | ICD-10-CM | POA: Insufficient documentation

## 2021-10-24 DIAGNOSIS — Z818 Family history of other mental and behavioral disorders: Secondary | ICD-10-CM | POA: Diagnosis not present

## 2021-10-24 DIAGNOSIS — F1020 Alcohol dependence, uncomplicated: Secondary | ICD-10-CM | POA: Diagnosis present

## 2021-10-24 DIAGNOSIS — Z79899 Other long term (current) drug therapy: Secondary | ICD-10-CM | POA: Diagnosis not present

## 2021-10-24 DIAGNOSIS — R45850 Homicidal ideations: Secondary | ICD-10-CM | POA: Diagnosis present

## 2021-10-24 DIAGNOSIS — Z635 Disruption of family by separation and divorce: Secondary | ICD-10-CM | POA: Diagnosis not present

## 2021-10-24 DIAGNOSIS — Z8249 Family history of ischemic heart disease and other diseases of the circulatory system: Secondary | ICD-10-CM | POA: Diagnosis not present

## 2021-10-24 DIAGNOSIS — Z560 Unemployment, unspecified: Secondary | ICD-10-CM | POA: Diagnosis not present

## 2021-10-24 DIAGNOSIS — F17210 Nicotine dependence, cigarettes, uncomplicated: Secondary | ICD-10-CM | POA: Diagnosis present

## 2021-10-24 DIAGNOSIS — F411 Generalized anxiety disorder: Secondary | ICD-10-CM | POA: Diagnosis present

## 2021-10-24 DIAGNOSIS — F319 Bipolar disorder, unspecified: Principal | ICD-10-CM | POA: Diagnosis present

## 2021-10-24 DIAGNOSIS — F909 Attention-deficit hyperactivity disorder, unspecified type: Secondary | ICD-10-CM | POA: Diagnosis present

## 2021-10-24 DIAGNOSIS — R0781 Pleurodynia: Secondary | ICD-10-CM | POA: Diagnosis present

## 2021-10-24 DIAGNOSIS — G629 Polyneuropathy, unspecified: Secondary | ICD-10-CM | POA: Diagnosis present

## 2021-10-24 DIAGNOSIS — F332 Major depressive disorder, recurrent severe without psychotic features: Secondary | ICD-10-CM | POA: Diagnosis not present

## 2021-10-24 DIAGNOSIS — K30 Functional dyspepsia: Secondary | ICD-10-CM | POA: Diagnosis present

## 2021-10-24 DIAGNOSIS — R569 Unspecified convulsions: Secondary | ICD-10-CM | POA: Diagnosis present

## 2021-10-24 DIAGNOSIS — F410 Panic disorder [episodic paroxysmal anxiety] without agoraphobia: Secondary | ICD-10-CM | POA: Diagnosis present

## 2021-10-24 DIAGNOSIS — W109XXA Fall (on) (from) unspecified stairs and steps, initial encounter: Secondary | ICD-10-CM | POA: Diagnosis present

## 2021-10-24 DIAGNOSIS — F10239 Alcohol dependence with withdrawal, unspecified: Secondary | ICD-10-CM | POA: Diagnosis present

## 2021-10-24 DIAGNOSIS — R440 Auditory hallucinations: Secondary | ICD-10-CM | POA: Diagnosis present

## 2021-10-24 DIAGNOSIS — F329 Major depressive disorder, single episode, unspecified: Secondary | ICD-10-CM | POA: Diagnosis present

## 2021-10-24 DIAGNOSIS — Z811 Family history of alcohol abuse and dependence: Secondary | ICD-10-CM

## 2021-10-24 DIAGNOSIS — F4310 Post-traumatic stress disorder, unspecified: Secondary | ICD-10-CM

## 2021-10-24 DIAGNOSIS — F333 Major depressive disorder, recurrent, severe with psychotic symptoms: Secondary | ICD-10-CM | POA: Diagnosis not present

## 2021-10-24 DIAGNOSIS — Y908 Blood alcohol level of 240 mg/100 ml or more: Secondary | ICD-10-CM | POA: Insufficient documentation

## 2021-10-24 DIAGNOSIS — Z20822 Contact with and (suspected) exposure to covid-19: Secondary | ICD-10-CM | POA: Insufficient documentation

## 2021-10-24 LAB — RESP PANEL BY RT-PCR (FLU A&B, COVID) ARPGX2
Influenza A by PCR: NEGATIVE
Influenza B by PCR: NEGATIVE
SARS Coronavirus 2 by RT PCR: NEGATIVE

## 2021-10-24 LAB — ACETAMINOPHEN LEVEL: Acetaminophen (Tylenol), Serum: <10 ug/mL — ABNORMAL LOW (ref 10–30)

## 2021-10-24 LAB — CBC WITH DIFFERENTIAL/PLATELET
Abs Immature Granulocytes: 0.03 10*3/uL (ref 0.00–0.07)
Basophils Absolute: 0 10*3/uL (ref 0.0–0.1)
Basophils Relative: 1 %
Eosinophils Absolute: 0.2 10*3/uL (ref 0.0–0.5)
Eosinophils Relative: 3 %
HCT: 43.5 % (ref 39.0–52.0)
Hemoglobin: 14.6 g/dL (ref 13.0–17.0)
Immature Granulocytes: 0 %
Lymphocytes Relative: 30 %
Lymphs Abs: 2.4 10*3/uL (ref 0.7–4.0)
MCH: 34 pg (ref 26.0–34.0)
MCHC: 33.6 g/dL (ref 30.0–36.0)
MCV: 101.4 fL — ABNORMAL HIGH (ref 80.0–100.0)
Monocytes Absolute: 1 10*3/uL (ref 0.1–1.0)
Monocytes Relative: 13 %
Neutro Abs: 4.2 10*3/uL (ref 1.7–7.7)
Neutrophils Relative %: 53 %
Platelets: 216 10*3/uL (ref 150–400)
RBC: 4.29 MIL/uL (ref 4.22–5.81)
RDW: 11.9 % (ref 11.5–15.5)
WBC: 7.9 10*3/uL (ref 4.0–10.5)
nRBC: 0 % (ref 0.0–0.2)

## 2021-10-24 LAB — COMPREHENSIVE METABOLIC PANEL
ALT: 14 U/L (ref 0–44)
AST: 20 U/L (ref 15–41)
Albumin: 4.8 g/dL (ref 3.5–5.0)
Alkaline Phosphatase: 46 U/L (ref 38–126)
Anion gap: 9 (ref 5–15)
BUN: 10 mg/dL (ref 6–20)
CO2: 26 mmol/L (ref 22–32)
Calcium: 9.1 mg/dL (ref 8.9–10.3)
Chloride: 103 mmol/L (ref 98–111)
Creatinine, Ser: 0.86 mg/dL (ref 0.61–1.24)
GFR, Estimated: >60 mL/min (ref >60)
Glucose, Bld: 93 mg/dL (ref 70–99)
Potassium: 3.8 mmol/L (ref 3.5–5.1)
Sodium: 138 mmol/L (ref 135–145)
Total Bilirubin: 0.5 mg/dL (ref 0.3–1.2)
Total Protein: 8.3 g/dL — ABNORMAL HIGH (ref 6.5–8.1)

## 2021-10-24 LAB — SALICYLATE LEVEL: Salicylate Lvl: <7 mg/dL — ABNORMAL LOW (ref 7.0–30.0)

## 2021-10-24 LAB — ETHANOL: Alcohol, Ethyl (B): 288 mg/dL — ABNORMAL HIGH (ref <10)

## 2021-10-24 MED ORDER — LOPERAMIDE HCL 2 MG PO CAPS: 2.0000 mg | ORAL_CAPSULE | ORAL | Status: AC | PRN

## 2021-10-24 MED ORDER — ADULT MULTIVITAMIN W/MINERALS CH
1.0000 | ORAL_TABLET | Freq: Every day | ORAL | Status: DC
  Administered 2021-10-24 – 2021-11-02 (×9): 1 via ORAL
  Filled 2021-10-24 (×12): qty 1

## 2021-10-24 MED ORDER — HYDROXYZINE HCL 25 MG PO TABS
25.0000 mg | ORAL_TABLET | Freq: Four times a day (QID) | ORAL | Status: AC | PRN
  Administered 2021-10-25 – 2021-10-26 (×2): 25 mg via ORAL
  Filled 2021-10-24 (×2): qty 1

## 2021-10-24 MED ORDER — PANTOPRAZOLE SODIUM 40 MG PO TBEC
40.0000 mg | DELAYED_RELEASE_TABLET | Freq: Every day | ORAL | Status: DC
  Administered 2021-10-24 – 2021-11-02 (×10): 40 mg via ORAL
  Filled 2021-10-24 (×13): qty 1

## 2021-10-24 MED ORDER — LORAZEPAM 1 MG PO TABS
1.0000 mg | ORAL_TABLET | Freq: Four times a day (QID) | ORAL | Status: AC
Start: 2021-10-24 — End: 2021-10-25
  Administered 2021-10-24 – 2021-10-25 (×4): 1 mg via ORAL
  Filled 2021-10-24 (×4): qty 1

## 2021-10-24 MED ORDER — TRAMADOL HCL 50 MG PO TABS
50.0000 mg | ORAL_TABLET | Freq: Once | ORAL | Status: AC
  Administered 2021-10-24: 50 mg via ORAL
  Filled 2021-10-24: qty 1

## 2021-10-24 MED ORDER — LORAZEPAM 1 MG PO TABS
1.0000 mg | ORAL_TABLET | Freq: Two times a day (BID) | ORAL | Status: AC
  Administered 2021-10-26 – 2021-10-27 (×2): 1 mg via ORAL
  Filled 2021-10-24 (×2): qty 1

## 2021-10-24 MED ORDER — MAGNESIUM HYDROXIDE 400 MG/5ML PO SUSP
30.0000 mL | Freq: Every day | ORAL | Status: DC | PRN
  Administered 2021-10-28: 30 mL via ORAL
  Filled 2021-10-24: qty 30

## 2021-10-24 MED ORDER — CARBAMAZEPINE 100 MG PO CHEW
100.0000 mg | CHEWABLE_TABLET | Freq: Three times a day (TID) | ORAL | Status: DC
  Administered 2021-10-24 – 2021-10-29 (×15): 100 mg via ORAL
  Filled 2021-10-24 (×22): qty 1

## 2021-10-24 MED ORDER — LORAZEPAM 1 MG PO TABS
1.0000 mg | ORAL_TABLET | Freq: Four times a day (QID) | ORAL | Status: DC | PRN
Start: 2021-10-24 — End: 2021-10-25

## 2021-10-24 MED ORDER — ALUM & MAG HYDROXIDE-SIMETH 200-200-20 MG/5ML PO SUSP: 30.0000 mL | ORAL | Status: DC | PRN

## 2021-10-24 MED ORDER — TRAZODONE HCL 50 MG PO TABS
50.0000 mg | ORAL_TABLET | Freq: Every evening | ORAL | Status: DC | PRN
  Administered 2021-10-24 – 2021-10-25 (×2): 50 mg via ORAL
  Filled 2021-10-24 (×2): qty 1

## 2021-10-24 MED ORDER — DICYCLOMINE HCL 10 MG PO CAPS
10.0000 mg | ORAL_CAPSULE | Freq: Three times a day (TID) | ORAL | Status: DC
  Administered 2021-10-24 – 2021-11-02 (×34): 10 mg via ORAL
  Filled 2021-10-24 (×43): qty 1

## 2021-10-24 MED ORDER — LIDOCAINE 5 % EX PTCH
2.0000 | MEDICATED_PATCH | CUTANEOUS | Status: DC
  Administered 2021-10-24 – 2021-11-01 (×9): 2 via TRANSDERMAL
  Filled 2021-10-24 (×11): qty 2

## 2021-10-24 MED ORDER — LORAZEPAM 1 MG PO TABS
1.0000 mg | ORAL_TABLET | Freq: Every day | ORAL | Status: AC
Start: 2021-10-28 — End: 2021-10-28
  Administered 2021-10-28: 1 mg via ORAL
  Filled 2021-10-24: qty 1

## 2021-10-24 MED ORDER — ONDANSETRON 4 MG PO TBDP: 4.0000 mg | ORAL_TABLET | Freq: Four times a day (QID) | ORAL | Status: AC | PRN

## 2021-10-24 MED ORDER — GABAPENTIN 400 MG PO CAPS
400.0000 mg | ORAL_CAPSULE | Freq: Three times a day (TID) | ORAL | Status: DC
  Administered 2021-10-24 – 2021-10-26 (×6): 400 mg via ORAL
  Filled 2021-10-24 (×14): qty 1

## 2021-10-24 MED ORDER — NAPROXEN 250 MG PO TABS
250.0000 mg | ORAL_TABLET | Freq: Two times a day (BID) | ORAL | Status: DC
  Administered 2021-10-24 – 2021-10-25 (×2): 250 mg via ORAL
  Filled 2021-10-24 (×8): qty 1

## 2021-10-24 MED ORDER — SERTRALINE HCL 100 MG PO TABS
100.0000 mg | ORAL_TABLET | Freq: Every day | ORAL | Status: DC
  Administered 2021-10-25: 100 mg via ORAL
  Filled 2021-10-24 (×6): qty 1

## 2021-10-24 MED ORDER — THIAMINE HCL 100 MG/ML IJ SOLN
100.0000 mg | Freq: Once | INTRAMUSCULAR | Status: AC
Start: 2021-10-24 — End: 2021-10-24
  Administered 2021-10-24: 100 mg via INTRAMUSCULAR
  Filled 2021-10-24: qty 2

## 2021-10-24 MED ORDER — ACETAMINOPHEN 325 MG PO TABS
650.0000 mg | ORAL_TABLET | Freq: Four times a day (QID) | ORAL | Status: DC | PRN
  Administered 2021-10-25: 650 mg via ORAL
  Filled 2021-10-24: qty 2

## 2021-10-24 MED ORDER — LORAZEPAM 1 MG PO TABS
1.0000 mg | ORAL_TABLET | Freq: Three times a day (TID) | ORAL | Status: AC
Start: 2021-10-25 — End: 2021-10-26
  Administered 2021-10-25 – 2021-10-26 (×3): 1 mg via ORAL
  Filled 2021-10-24 (×3): qty 1

## 2021-10-24 MED ORDER — THIAMINE HCL 100 MG PO TABS
100.0000 mg | ORAL_TABLET | Freq: Every day | ORAL | Status: DC
  Administered 2021-10-25 – 2021-11-02 (×9): 100 mg via ORAL
  Filled 2021-10-24 (×11): qty 1

## 2021-10-24 NOTE — BH Assessment (Signed)
[facility] Assessment Progress Note   Per Dorena Bodo, NP, this pt requires psychiatric hospitalization at this time.  Danika, RN, [facility] has assigned pt to [facility] Rm [id number] to the service of Dr Loleta Chance.  [facility] will be ready to receive pt at 15:30.  Pt has signed Voluntary Admission and Consent for Treatment, as well as Consent to Release Information to his mother, and signed forms have been faxed to [facility].  EDP Coralee Pesa, DO and pt's nurse, Kennyth Arnold, have been notified, and Kennyth Arnold agrees to send original paperwork along with pt via [facility], and to call report to [phone].  Doylene Canning, [place] Behavioral Health Coordinator [phone]

## 2021-10-24 NOTE — Tx Team (Signed)
Initial Treatment Plan 10/24/2021 7:07 PM John Vaughan BMW:[id number]    PATIENT STRESSORS: Marital or family conflict   Substance abuse     PATIENT STRENGTHS: Average or above average intelligence  Capable of independent living  Communication skills  Motivation for treatment/growth    PATIENT IDENTIFIED PROBLEMS: Suicidal ideation    hopelessness  "Trying to take care of house and my Mom"    "Anger issues"           DISCHARGE CRITERIA:  Improved stabilization in mood, thinking, and/or behavior Reduction of life-threatening or endangering symptoms to within safe limits Verbal commitment to aftercare and medication compliance  PRELIMINARY DISCHARGE PLAN: Attend 12-step recovery group Outpatient therapy Return to previous living arrangement  PATIENT/FAMILY INVOLVEMENT: This treatment plan has been presented to and reviewed with the patient, John Vaughan, The patient has been given the opportunity to ask questions and make suggestions.  Shela Nevin, RN 10/24/2021, 7:07 PM

## 2021-10-24 NOTE — ED Notes (Signed)
Pt was given meal at this time.

## 2021-10-24 NOTE — ED Notes (Signed)
Pt is requesting blood work and food.  Pt states that he is "mentally not ok".  Will check with assigned provider.

## 2021-10-24 NOTE — ED Notes (Signed)
Pt reports that he has been using etoh and cocaine, PA notified.  Pt also reports that he has nowhere to go, he said that sometimes he can stay with his mom, walked pt out and showed him phone to use to call his mother.  Pt was able to ambulate independently.

## 2021-10-24 NOTE — ED Notes (Signed)
Report given to [place], RN-safe transport to [facility]
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
## 2021-10-24 NOTE — ED Triage Notes (Signed)
Pt reports with suicidal ideations. Pt states that he does crack along with his mother (whom he lives with). Pt states that he has an alcohol problem. Pt has some marks up his left harm (self infliction) that he states is recent. Pt reports feeling overwhelmed and not knowing what to do.

## 2021-10-24 NOTE — Progress Notes (Signed)
The patient's positive event for the day is that he had fewer mood swings. His goal for tomorrow is to "have fun".

## 2021-10-24 NOTE — ED Provider Notes (Signed)
[facility] DEPT Provider Note   CSN: [id number] Arrival date & time: 10/23/21  2258     History Chief Complaint  Patient presents with   Rib Injury    John Vaughan is a 30 y.o. male.  The history is provided by the patient and medical records.   30 year old male with history of anxiety, depression, alcohol abuse, presenting to the ED with right-sided rib pain after reported falling down his stairs this morning.  States he missed a step and tumbled down.  He denies any head injury or loss of consciousness.  States right-sided rib pain ongoing all day today, worse with movement and deep breathing.  He denies feeling short of breath.  Also asking for meal on arrival to ED.  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     Family History  Problem Relation Age of Onset   Anxiety disorder Mother    Alcohol abuse Father    Anxiety disorder Maternal Aunt    Anxiety disorder Maternal Grandmother    Alcohol abuse Paternal Grandfather     Social History   Tobacco Use   Smoking status: Every Day    Packs/day: 1.50    Types: Cigarettes   Smokeless tobacco: Former    Types: Snuff  Vaping Use   Vaping Use: Never used  Substance Use Topics   Alcohol use: Yes     Alcohol/week: 2.0 standard drinks    Types: 2 Cans of beer per week    Comment: states that he drinks 2 beers daily   Drug use: Not Currently    Types: Marijuana, Cocaine    Comment: cocaine in last month    Home Medications Prior to Admission medications   Medication Sig Start Date End Date Taking? Authorizing Provider  carbamazepine (TEGRETOL) 100 MG chewable tablet Chew 1 tablet (100 mg total) by mouth 3 (three) times daily. 06/11/21   Sharma Covert, MD  dicyclomine (BENTYL) 10 MG capsule Take 1 capsule (10 mg total) by mouth 4 (four) times daily -  before meals and at bedtime. 06/11/21   Sharma Covert, MD  gabapentin (NEURONTIN) 400 MG capsule Take 1 capsule (400 mg total) by mouth 3 (three) times daily. 06/11/21   Sharma Covert, MD  hydrOXYzine (ATARAX/VISTARIL) 50 MG tablet Take 1 tablet (50 mg total) by mouth 3 (three) times daily as needed for anxiety. 06/11/21   Sharma Covert, MD  pantoprazole (PROTONIX) 40 MG tablet Take 1 tablet (40 mg total) by mouth daily. 06/12/21   Sharma Covert, MD  sertraline (ZOLOFT) 50 MG tablet Take 2 tablets (100 mg total) by mouth daily. 06/11/21   Sharma Covert, MD  traZODone (DESYREL) 100 MG tablet Take 1 tablet (100 mg total) by mouth at bedtime as needed for sleep. 06/11/21  Antonieta Pert, MD    Allergies    Patient has no known allergies.  Review of Systems   Review of Systems  Cardiovascular:  Positive for chest pain (rib pain).  All other systems reviewed and are negative.  Physical Exam Updated Vital Signs BP 103/80   Pulse 93   Temp 98.2 F (36.8 C) (Oral)   Resp 18   SpO2 97%   Physical Exam Vitals and nursing note reviewed.  Constitutional:      Appearance: He is well-developed.     Comments: Initially calm but begins flailing around stretcher  HENT:     Head: Normocephalic and atraumatic.  Eyes:     Conjunctiva/sclera: Conjunctivae normal.     Pupils: Pupils are equal, round, and reactive to  light.  Cardiovascular:     Rate and Rhythm: Normal rate and regular rhythm.     Heart sounds: Normal heart sounds.  Pulmonary:     Effort: Pulmonary effort is normal. No respiratory distress.     Breath sounds: Normal breath sounds. No rhonchi.  Chest:     Comments: Atraumatic without any marks, abrasions, bruising, or deformities of the chest wall Abdominal:     General: Bowel sounds are normal.     Palpations: Abdomen is soft.     Tenderness: There is no abdominal tenderness. There is no rebound.  Musculoskeletal:        General: Normal range of motion.     Cervical back: Normal range of motion.  Skin:    General: Skin is warm and dry.  Neurological:     Mental Status: He is alert and oriented to person, place, and time.    ED Results / Procedures / Treatments   Labs (all labs ordered are listed, but only abnormal results are displayed) Labs Reviewed - No data to display  EKG None  Radiology DG Ribs Unilateral W/Chest Right  Result Date: 10/23/2021 CLINICAL DATA:  Fall down steps EXAM: RIGHT RIBS AND CHEST - 3+ VIEW COMPARISON:  Chest radiograph dated 05/24/2021 FINDINGS: Lungs are clear.  No pleural effusion or pneumothorax. The heart is normal in size. No displaced right rib fracture is seen. IMPRESSION: Negative. Electronically Signed   By: Charline Bills M.D.   On: 10/23/2021 23:25    Procedures Procedures   Medications Ordered in ED Medications - No data to display  ED Course  I have reviewed the triage vital signs and the nursing notes.  Pertinent labs & imaging results that were available during my care of the patient were reviewed by me and considered in my medical decision making (see chart for details).    MDM Rules/Calculators/A&P                           30 year old male here with right-sided rib pain after falling down the stairs this morning.  There was no head injury or loss of consciousness.  When approached for exam, he is relaxing  comfortably but then begins flailing around stretcher.  He does have any deformity, bruising, or other acute signs of acute trauma on his right side.  Lungs CTAB.  Rib films are negative.  He has been eating/drinking here in ED without issue.  Stable for discharge with symptomatic care.  Return here for new concerns.  Final Clinical Impression(s) / ED Diagnoses Final diagnoses:  Rib pain on right side    Rx / DC Orders ED Discharge Orders  None        Larene Pickett, PA-C 10/24/21 Harl Bowie    Ezequiel Essex, MD 10/24/21 [phone]

## 2021-10-24 NOTE — ED Provider Notes (Addendum)
Of note [facility] DEPT Provider Note   CSN: [id number] Arrival date & time: 10/24/21  0210     History Chief Complaint  Patient presents with   Suicidal    John Vaughan is a 30 y.o. male.  HPI  31 year old male with past medical history of alcohol abuse, depression, self-mutilation presents the emergency department concern for suicidal ideation.  Patient had a mechanical fall yesterday downstairs, originally presented complaining of rib pain.  Rib films were negative and he was discharged.  Appears that the patient rechecked in with concern for SI.  He states that he drinks alcohol and does drugs with his mom.  He has been feeling very depressed and suicidal lately.  Wanting to harm himself and kill himself.  He does not reveal a specific plan.  States he at times has homicidal thoughts towards his mother as well.  Asking to speak with our behavioral health team.  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 No pertinent surgical history.     Family History  Problem Relation Age of Onset   Anxiety disorder Mother    Alcohol abuse Father    Anxiety disorder Maternal Aunt    Anxiety disorder Maternal Grandmother    Alcohol abuse Paternal Grandfather     Social History   Tobacco  Use   Smoking status: Every Day    Packs/day: 1.50    Types: Cigarettes   Smokeless tobacco: Former    Types: Snuff  Vaping Use   Vaping Use: Never used  Substance Use Topics   Alcohol use: Yes    Alcohol/week: 2.0 standard drinks    Types: 2 Cans of beer per week    Comment: states that he drinks 2 beers daily   Drug use: Not Currently    Types: Marijuana, Cocaine    Comment: cocaine in last month    Home Medications Prior to Admission medications   Medication Sig Start Date End Date Taking? Authorizing Provider  carbamazepine (TEGRETOL) 100 MG chewable tablet Chew 1 tablet (100 mg total) by mouth 3 (three) times daily. 06/11/21   Sharma Covert, MD  dicyclomine (BENTYL) 10 MG capsule Take 1 capsule (10 mg total) by mouth 4 (four) times daily -  before meals and at bedtime. 06/11/21   Sharma Covert, MD  gabapentin (NEURONTIN) 400 MG capsule Take 1 capsule (400 mg total) by mouth 3 (three) times daily. 06/11/21   Sharma Covert, MD  hydrOXYzine (ATARAX/VISTARIL) 50 MG tablet Take 1 tablet (50 mg total) by mouth 3 (three) times daily as needed for anxiety. 06/11/21   Sharma Covert, MD  pantoprazole (PROTONIX) 40 MG tablet Take 1 tablet (40 mg total) by mouth daily. 06/12/21   Sharma Covert, MD  sertraline (ZOLOFT) 50 MG tablet Take 2 tablets (100 mg total) by mouth daily.  06/11/21   Antonieta Pert, MD  traZODone (DESYREL) 100 MG tablet Take 1 tablet (100 mg total) by mouth at bedtime as needed for sleep. 06/11/21   Antonieta Pert, MD    Allergies    Patient has no known allergies.  Review of Systems   Review of Systems  Constitutional:  Negative for chills and fever.  HENT:  Negative for congestion.   Respiratory:  Negative for shortness of breath.   Cardiovascular:  Negative for chest pain.  Gastrointestinal:  Negative for abdominal pain and vomiting.  Genitourinary:  Negative for dysuria.  Skin:  Negative for rash.  Neurological:  Negative for  headaches.  Psychiatric/Behavioral:  Positive for agitation, decreased concentration, dysphoric mood, self-injury, sleep disturbance and suicidal ideas. Negative for hallucinations. The patient is nervous/anxious.    Physical Exam Updated Vital Signs BP 102/69 (BP Location: Left Arm)   Pulse 73   Temp 98.2 F (36.8 C) (Oral)   Resp 16   SpO2 100%   Physical Exam Vitals and nursing note reviewed.  Constitutional:      General: He is not in acute distress.    Appearance: Normal appearance.  HENT:     Head: Normocephalic.     Mouth/Throat:     Mouth: Mucous membranes are moist.  Cardiovascular:     Rate and Rhythm: Normal rate.  Pulmonary:     Effort: Pulmonary effort is normal. No respiratory distress.  Abdominal:     Palpations: Abdomen is soft.     Tenderness: There is no abdominal tenderness.  Skin:    General: Skin is warm.  Neurological:     Mental Status: He is alert and oriented to person, place, and time. Mental status is at baseline.  Psychiatric:        Mood and Affect: Mood normal.     Comments: Cooperative, talks to me directly with fluent speech    ED Results / Procedures / Treatments   Labs (all labs ordered are listed, but only abnormal results are displayed) Labs Reviewed  CBC WITH DIFFERENTIAL/PLATELET - Abnormal; Notable for the following components:      Result Value   MCV 101.4 (*)    All other components within normal limits  COMPREHENSIVE METABOLIC PANEL - Abnormal; Notable for the following components:   Total Protein 8.3 (*)    All other components within normal limits  ETHANOL - Abnormal; Notable for the following components:   Alcohol, Ethyl (B) 288 (*)    All other components within normal limits  SALICYLATE LEVEL - Abnormal; Notable for the following components:   Salicylate Lvl <7.0 (*)    All other components within normal limits  ACETAMINOPHEN LEVEL - Abnormal; Notable for the following components:   Acetaminophen (Tylenol), Serum  <10 (*)    All other components within normal limits  RESP PANEL BY RT-PCR (FLU A&B, COVID) ARPGX2  RAPID URINE DRUG SCREEN, HOSP PERFORMED    EKG None  Radiology DG Ribs Unilateral W/Chest Right  Result Date: 10/23/2021 CLINICAL DATA:  Fall down steps EXAM: RIGHT RIBS AND CHEST - 3+ VIEW COMPARISON:  Chest radiograph dated 05/24/2021 FINDINGS: Lungs are clear.  No pleural effusion or pneumothorax. The heart is normal in size. No displaced right rib fracture is seen. IMPRESSION: Negative. Electronically Signed   By: Charline Bills M.D.   On: 10/23/2021 23:25    Procedures Procedures   Medications Ordered in ED Medications  traMADol (ULTRAM) tablet 50 mg (50 mg Oral Given 10/24/21  Maurine.Goes)    ED Course  I have reviewed the triage vital signs and the nursing notes.  Pertinent labs & imaging results that were available during my care of the patient were reviewed by me and considered in my medical decision making (see chart for details).    MDM Rules/Calculators/A&P                           30 year old male presents emergency department concern for SI/HI.  Is not forthcoming in regards to any plans.  Vitals are stable on arrival.  Patient continues to complain of rib pain from a fall yesterday, rib x-rays are negative.  Will treat symptomatically.  Patient appears medically cleared for psychiatric evaluation, pending behavioral health evaluation.  TTS evaluated the patient, they recommend transfer to [facility]. Patient is willing and will be transferred.   Final Clinical Impression(s) / ED Diagnoses Final diagnoses:  None    Rx / DC Orders ED Discharge Orders     None        Lorelle Gibbs, DO 10/24/21 1042    Abrina Petz, Alvin Critchley, DO 10/24/21 1506

## 2021-10-25 ENCOUNTER — Encounter (HOSPITAL_COMMUNITY): Payer: Self-pay

## 2021-10-25 DIAGNOSIS — R45851 Suicidal ideations: Secondary | ICD-10-CM

## 2021-10-25 DIAGNOSIS — F332 Major depressive disorder, recurrent severe without psychotic features: Secondary | ICD-10-CM

## 2021-10-25 DIAGNOSIS — F4310 Post-traumatic stress disorder, unspecified: Secondary | ICD-10-CM

## 2021-10-25 DIAGNOSIS — F333 Major depressive disorder, recurrent, severe with psychotic symptoms: Secondary | ICD-10-CM

## 2021-10-25 LAB — RAPID URINE DRUG SCREEN, HOSP PERFORMED
Amphetamines: NOT DETECTED
Barbiturates: NOT DETECTED
Benzodiazepines: NOT DETECTED
Cocaine: POSITIVE — AB
Opiates: NOT DETECTED
Tetrahydrocannabinol: NOT DETECTED

## 2021-10-25 LAB — LIPID PANEL
Cholesterol: 208 mg/dL — ABNORMAL HIGH (ref 0–200)
HDL: 71 mg/dL (ref >40)
LDL Cholesterol: 93 mg/dL (ref 0–99)
Total CHOL/HDL Ratio: 2.9 RATIO
Triglycerides: 218 mg/dL — ABNORMAL HIGH (ref <150)
VLDL: 44 mg/dL — ABNORMAL HIGH (ref 0–40)

## 2021-10-25 LAB — HEMOGLOBIN A1C
Hgb A1c MFr Bld: 5.2 % (ref 4.8–5.6)
Mean Plasma Glucose: 102.54 mg/dL

## 2021-10-25 LAB — CARBAMAZEPINE LEVEL, TOTAL: Carbamazepine Lvl: <2 ug/mL — ABNORMAL LOW (ref 4.0–12.0)

## 2021-10-25 LAB — TSH: TSH: 6.48 u[IU]/mL — ABNORMAL HIGH (ref 0.350–4.500)

## 2021-10-25 MED ORDER — ACETAMINOPHEN 325 MG PO TABS: 650.0000 mg | ORAL_TABLET | Freq: Four times a day (QID) | ORAL | Status: DC | PRN

## 2021-10-25 MED ORDER — DULOXETINE HCL 20 MG PO CPEP: 20.0000 mg | ORAL_CAPSULE | Freq: Every day | ORAL | Status: DC

## 2021-10-25 MED ORDER — LORAZEPAM 1 MG PO TABS: 1.0000 mg | ORAL_TABLET | Freq: Four times a day (QID) | ORAL | Status: AC | PRN

## 2021-10-25 MED ORDER — DULOXETINE HCL 20 MG PO CPEP
20.0000 mg | ORAL_CAPSULE | Freq: Every day | ORAL | Status: DC
  Administered 2021-10-26: 20 mg via ORAL
  Filled 2021-10-25 (×3): qty 1

## 2021-10-25 MED ORDER — TRAZODONE HCL 50 MG PO TABS
50.0000 mg | ORAL_TABLET | Freq: Once | ORAL | Status: AC
  Administered 2021-10-26: 50 mg via ORAL
  Filled 2021-10-25: qty 1

## 2021-10-25 MED ORDER — IBUPROFEN 400 MG PO TABS
400.0000 mg | ORAL_TABLET | Freq: Four times a day (QID) | ORAL | Status: DC | PRN
  Administered 2021-10-25 – 2021-10-26 (×3): 400 mg via ORAL
  Filled 2021-10-25 (×4): qty 1

## 2021-10-25 NOTE — Group Note (Signed)
[facility] LCSW Group Therapy Note   Group Date: 10/25/2021 Start Time: 1300 End Time: 1345   Type of Therapy/Topic:  Group Therapy:  Emotion Regulation  Participation Level:  Did Not Attend    Description of Group:    The purpose of this group is to assist patients in learning to regulate negative emotions and experience positive emotions. Patients will be guided to discuss ways in which they have been vulnerable to their negative emotions. These vulnerabilities will be juxtaposed with experiences of positive emotions or situations, and patients challenged to use positive emotions to combat negative ones. Special emphasis will be placed on coping with negative emotions in conflict situations, and patients will process healthy conflict resolution skills.  Therapeutic Goals: Patient will identify two positive emotions or experiences to reflect on in order to balance out negative emotions:  Patient will label two or more emotions that they find the most difficult to experience:  Patient will be able to demonstrate positive conflict resolution skills through discussion or role plays:   Summary of Patient Progress: Did Not Attend   Therapeutic Modalities:   Cognitive Behavioral Therapy Feelings Identification Dialectical Behavioral Therapy   Felizardo Hoffmann, LCSWA

## 2021-10-25 NOTE — [facility] Group Notes (Signed)
Pt attended NA meeting this evening.  

## 2021-10-25 NOTE — [facility] Counselor (Addendum)
CSW received information by phone from the police department that the Pt has charges that require the CSW to inform the police of his discharge from the hospital (duty to inform).  Once this information is received from the police department CSW will place it in the Pt's chart.

## 2021-10-25 NOTE — Progress Notes (Signed)
   10/25/21 0000  Psych Admission Type (Psych Patients Only)  Admission Status Voluntary  Psychosocial Assessment  Patient Complaints Anxiety  Eye Contact Fair  Facial Expression Anxious  Affect Sad  Speech Logical/coherent  Interaction Assertive  Motor Activity Slow  Appearance/Hygiene Disheveled  Behavior Characteristics Cooperative  Mood Pleasant  Aggressive Behavior  Effect No apparent injury  Thought Process  Coherency WDL  Content WDL  Delusions None reported or observed  Perception WDL  Hallucination None reported or observed  Judgment Poor  Confusion WDL  Danger to Self  Current suicidal ideation? Passive  Self-Injurious Behavior Some self-injurious ideation observed or expressed.  No lethal plan expressed   Agreement Not to Harm Self Yes  Description of Agreement verbal contract for safety  Danger to Others  Danger to Others None reported or observed

## 2021-10-25 NOTE — Group Note (Signed)
Recreation Therapy Group Note   Group Topic:Stress Management  Group Date: 10/25/2021 Start Time: 0930 End Time: 0950 Facilitators: Caroll Rancher, LRT/CTRS Location: [address] Dayroom   Goal Area(s) Addresses:  Patient will actively participate in stress management techniques presented during session.  Patient will successfully identify benefit of practicing stress management post d/c.    Group Description: Guided Imagery. LRT provided education, instruction, and demonstration on practice of visualization via guided imagery. Patient was asked to participate in the technique introduced during session.  Patients were given suggestions of ways to access scripts post d/c and encouraged to explore Youtube and other apps available on smartphones, tablets, and computers   Affect/Mood: Appropriate   Participation Level: Engaged   Participation Quality: Independent   Behavior: Appropriate   Speech/Thought Process: Focused   Insight: Good   Judgement: Good   Modes of Intervention: Soft Ocean Sounds; Script   Patient Response to Interventions:  Engaged   Education Outcome:  Acknowledges education and In group clarification offered    Clinical Observations/Individualized Feedback: Pt attended and participated in group session.    Plan: Continue to engage patient in RT group sessions 2-3x/week.   Caroll Rancher, LRT/CTRS 10/25/2021 12:22 PM
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
## 2021-10-25 NOTE — B[facility] Suicide Risk Assessment (Signed)
[facility] Admission Suicide Risk Assessment   Nursing information obtained from:  Patient Demographic factors:  Male, Unemployed, Caucasian, Low socioeconomic status Current Mental Status:  Suicidal ideation indicated by patient Loss Factors:  NA Historical Factors:  Family history of mental illness or substance abuse, Anniversary of important loss Risk Reduction Factors:  Sense of responsibility to family, Living with another person, especially a relative  Total Time spent with patient: 45 minutes Principal Problem: Major depressive disorder, recurrent episode, severe (HCC) Diagnosis:  Principal Problem:   Major depressive disorder, recurrent episode, severe (HCC) Active Problems:   GAD (generalized anxiety disorder)   Alcohol use disorder, severe, dependence (HCC)   PTSD (post-traumatic stress disorder)  Subjective Data:   Patient is a 30 year old male with a psychiatric history of alcohol use disorder, major depressive disorder, generalized anxiety disorder, and PTSD, who was admitted to the psychiatric hospital for evaluation and treatment of depression, suicidal thoughts, and auditory hallucinations. Patient was interviewed and evaluated with the resident physician at [facility] today.  Patient case was discussed in interdisciplinary team meeting this morning  Patient reports worsening mood and depression for some time, due to multiple stressors: Anniversary of his father's death November 19, 2023), interpersonal conflict at home, financial insecurity, and dependence on alcohol.   Patient reports that he had fallen down some stairs, due to rain and slipperiness, and during the evaluation for the fall, he had told the emergency personnel "to shoot me".  Patient reports history of depression, worsening over the last 2 years, since his father's death.  He reports repressing these feelings, which he reports causes him to have irritable and angry outburst.  Reports poor sleep.  Reports decreased  appetite.  Reports low energy.  Reports poor concentration.  Denies having homicidal thoughts.   During interview today, he denied having auditory and visualizations.  Denies having command auditory hallucinations.  He reports in the past, having auditory hallucinations.  He reports that in the past, he had visual hallucinations "of a spider "when he was withdrawing from alcohol.  Reports a history of multiple traumas, physical, emotional, verbal.  Denies a history of sexual trauma.  Reports nightmares intrusive memories and flashbacks.  Reports negative alteration in cognition and mood.  Reports hypervigilance and avoidance symptoms. Denies having symptoms meeting criteria for hypomania or mania.  He reports "I think I am bipolar because my mood changes from depressed to happy to great, minute by minute or hour by hour". Patient reports not taking prescribed psychiatric medications for about 2 to 3 months.  He reports most recent psychiatric medications, which she was not taking, are: Zoloft, carbamazepine, gabapentin. He reports a history of cutting himself "for attention".  Denies this was an attempt to actually end his life in the past.  Reports history of psychiatric hospitalizations.  He also reports being diagnosed with ADHD in the past. He reports a history of alcohol withdrawal seizures being hospitalized for alcohol withdrawal. Patient agreeable with medication changes, as discussed in the plan section of this document.  Continued Clinical Symptoms:  Alcohol Use Disorder Identification Test Final Score (AUDIT): 7 The "Alcohol Use Disorders Identification Test", Guidelines for Use in Primary Care, Second Edition.  World Science writer [facility]). Score between 0-7:  no or low risk or alcohol related problems. Score between 8-15:  moderate risk of alcohol related problems. Score between 16-19:  high risk of alcohol related problems. Score 20 or above:  warrants further diagnostic evaluation for  alcohol dependence and treatment.   CLINICAL  FACTORS:   Severe Anxiety and/or Agitation Panic Attacks Depression:   Anhedonia Hopelessness Insomnia Alcohol/Substance Abuse/Dependencies Previous Psychiatric Diagnoses and Treatments   Musculoskeletal: Strength & Muscle Tone: within normal limits Gait & Station: normal Patient leans: N/A  Psychiatric Specialty Exam:  Presentation  General Appearance: -- (poorly groomed in dirty ill fitting clothing)  Eye Contact:Good  Speech:Normal Rate  Speech Volume:Normal  Handedness:Right   Mood and Affect  Mood:Anxious; Depressed; Dysphoric; Hopeless  Affect:Constricted; Congruent   Thought Process  Thought Processes:Linear  Descriptions of Associations:Intact  Orientation:Full (Time, Place and Person)  Thought Content:Logical  History of Schizophrenia/Schizoaffective disorder:No  Duration of Psychotic Symptoms:No data recorded Hallucinations:Hallucinations: None  Ideas of Reference:None  Suicidal Thoughts:Suicidal Thoughts: Yes, Passive SI Passive Intent and/or Plan: Without Intent; Without Plan  Homicidal Thoughts:Homicidal Thoughts: No   Sensorium  Memory:Immediate Fair; Recent Fair; Remote Fair  Judgment:Poor  Insight:Fair   Executive Functions  Concentration:Fair  Attention Span:Fair  Recall:Fair  Fund of Knowledge:Fair  Language:Fair   Psychomotor Activity  Psychomotor Activity:Psychomotor Activity: Normal   Assets  Assets:Desire for Improvement; Physical Health; Resilience; Transportation; Housing   Sleep  Sleep:Sleep: Poor    Physical Exam: Physical Exam ROS Blood pressure 111/80, pulse 63, temperature 97.7 F (36.5 C), temperature source Oral, resp. rate 16, height 6' (1.829 m), weight 78.5 kg. Body mass index is 23.46 kg/m.   COGNITIVE FEATURES THAT CONTRIBUTE TO RISK:  None    SUICIDE RISK:   Moderate:  Frequent suicidal ideation with limited intensity, and duration,  some specificity in terms of plans, no associated intent, good self-control, limited dysphoria/symptomatology, some risk factors present, and identifiable protective factors, including available and accessible social support.  PLAN OF CARE:   ASSESSMENT:  Diagnoses / Active Problems:  Major depressive disorder, severe, recurrent, without psychotic features Generalized anxiety disorder panic attacks PTSD Alcohol use disorder, severe Reported history of ADHD   PLAN: Safety and Monitoring:  -- Voluntary admission to inpatient psychiatric unit for safety, stabilization and treatment  -- Daily contact with patient to assess and evaluate symptoms and progress in treatment  -- Patient's case to be discussed in multi-disciplinary team meeting  -- Observation Level : q15 minute checks  -- Vital signs:  q12 hours  -- Precautions: suicide, elopement, and assault  2. Psychiatric Diagnoses and Treatment:   Stop Zoloft Start Cymbalta 20 mg once daily, for mood, anxiety, PTSD, reported neuropathic pain Continue carbamazepine 100 mg 3 times daily for mood stabilization and seizure prophylaxis Continue gabapentin 40 mg 3 times daily for anxiety and neuropathy Continue trazodone as needed nightly for sleep Continue CIWA with scheduled Ativan and also as needed Ativan for CIWA score greater than 8 Continue other medications as ordered  --  The risks/benefits/side-effects/alternatives to this medication were discussed in detail with the patient and time was given for questions. The patient consents to medication trial.    -- Metabolic profile and EKG monitoring obtained while on an atypical antipsychotic (BMI: Lipid Panel: HbgA1c: QTc:) 431  -- Encouraged patient to participate in unit milieu and in scheduled group therapies   -- Short Term Goals: Ability to identify changes in lifestyle to reduce recurrence of condition will improve, Ability to verbalize feelings will improve, Ability to disclose  and discuss suicidal ideas, Ability to demonstrate self-control will improve, Ability to identify and develop effective coping behaviors will improve, Ability to maintain clinical measurements within normal limits will improve, Compliance with prescribed medications will improve, and Ability to identify triggers associated with substance  abuse/mental health issues will improve  -- Long Term Goals: Improvement in symptoms so as ready for discharge      3. Medical Issues Being Addressed:   Tobacco Use Disorder  -- Nicotine patch 21mg /24 hours ordered  -- Smoking cessation encouraged  4. Discharge Planning:   -- Social work and case management to assist with discharge planning and identification of hospital follow-up needs prior to discharge  -- Estimated LOS: 5-7 days  -- Discharge Concerns: Need to establish a safety plan; Medication compliance and effectiveness  -- Discharge Goals: Return home with outpatient referrals for mental health follow-up including medication management/psychotherapy       I certify that inpatient services furnished can reasonably be expected to improve the patient's condition.   06-11-1982, MD 10/25/2021, 11:45 AM

## 2021-10-25 NOTE — BH IP Treatment Plan (Signed)
Interdisciplinary Treatment and Diagnostic Plan Update  10/25/2021 Time of Session: 9:20am  John Vaughan MRN: [id number]  Principal Diagnosis: Major depressive disorder, recurrent episode, severe ([place])  Secondary Diagnoses: Principal Problem:   Major depressive disorder, recurrent episode, severe ([place]) Active Problems:   GAD (generalized anxiety disorder)   Alcohol use disorder, severe, dependence ([place])   PTSD (post-traumatic stress disorder)   Current Medications:  Current Facility-Administered Medications  Medication Dose Route Frequency Provider Last Rate Last Admin   acetaminophen (TYLENOL) tablet 650 mg  650 mg Oral Q6H PRN Corky Sox, MD       Or   ibuprofen (ADVIL) tablet 400 mg  400 mg Oral Q6H PRN Corky Sox, MD       alum & mag hydroxide-simeth (MAALOX/MYLANTA) 200-200-20 MG/5ML suspension 30 mL  30 mL Oral Q4H PRN Chalmers Guest, NP       carbamazepine (TEGRETOL) chewable tablet 100 mg  100 mg Oral TID Chalmers Guest, NP   100 mg at 10/25/21 1310   dicyclomine (BENTYL) capsule 10 mg  10 mg Oral TID AC & HS Chalmers Guest, NP   10 mg at 10/25/21 1311   [START ON 10/26/2021] DULoxetine (CYMBALTA) DR capsule 20 mg  20 mg Oral Daily Corky Sox, MD       gabapentin (NEURONTIN) capsule 400 mg  400 mg Oral TID Chalmers Guest, NP   400 mg at 10/25/21 1310   hydrOXYzine (ATARAX/VISTARIL) tablet 25 mg  25 mg Oral Q6H PRN Chalmers Guest, NP       lidocaine (LIDODERM) 5 % 2 patch  2 patch Transdermal Q24H Maida Sale, MD   2 patch at 10/24/21 1814   loperamide (IMODIUM) capsule 2-4 mg  2-4 mg Oral PRN Chalmers Guest, NP       LORazepam (ATIVAN) tablet 1 mg  1 mg Oral TID Chalmers Guest, NP       Followed by   Derrill Memo ON 10/26/2021] LORazepam (ATIVAN) tablet 1 mg  1 mg Oral BID Chalmers Guest, NP       Followed by   Derrill Memo ON 10/28/2021] LORazepam (ATIVAN) tablet 1 mg  1 mg Oral Daily Chalmers Guest, NP       LORazepam (ATIVAN) tablet 1 mg  1 mg Oral Q6H  PRN Corky Sox, MD       magnesium hydroxide (MILK OF MAGNESIA) suspension 30 mL  30 mL Oral Daily PRN Chalmers Guest, NP       multivitamin with minerals tablet 1 tablet  1 tablet Oral Daily Chalmers Guest, NP   1 tablet at 10/25/21 0829   ondansetron (ZOFRAN-ODT) disintegrating tablet 4 mg  4 mg Oral Q6H PRN Chalmers Guest, NP       pantoprazole (PROTONIX) EC tablet 40 mg  40 mg Oral Daily Chalmers Guest, NP   40 mg at 10/25/21 2751   thiamine tablet 100 mg  100 mg Oral Daily Chalmers Guest, NP   100 mg at 10/25/21 7001   traZODone (DESYREL) tablet 50 mg  50 mg Oral QHS PRN Chalmers Guest, NP   50 mg at 10/24/21 2125   PTA Medications: Medications Prior to Admission  Medication Sig Dispense Refill Last Dose   carbamazepine (TEGRETOL) 100 MG chewable tablet Chew 1 tablet (100 mg total) by mouth 3 (three) times daily. (Patient not taking: No sig reported) 60 tablet 0    dicyclomine (BENTYL) 10 MG capsule Take  1 capsule (10 mg total) by mouth 4 (four) times daily -  before meals and at bedtime. (Patient not taking: No sig reported) 40 capsule 0    gabapentin (NEURONTIN) 400 MG capsule Take 1 capsule (400 mg total) by mouth 3 (three) times daily. (Patient not taking: No sig reported) 90 capsule 0    hydrOXYzine (ATARAX/VISTARIL) 50 MG tablet Take 1 tablet (50 mg total) by mouth 3 (three) times daily as needed for anxiety. (Patient not taking: No sig reported) 30 tablet 0    pantoprazole (PROTONIX) 40 MG tablet Take 1 tablet (40 mg total) by mouth daily. (Patient not taking: No sig reported) 30 tablet 0    sertraline (ZOLOFT) 50 MG tablet Take 2 tablets (100 mg total) by mouth daily. (Patient not taking: No sig reported) 30 tablet 0    traZODone (DESYREL) 100 MG tablet Take 1 tablet (100 mg total) by mouth at bedtime as needed for sleep. (Patient not taking: No sig reported) 30 tablet 0     Patient Stressors: Marital or family conflict   Substance abuse    Patient Strengths: Average or above  average intelligence  Capable of independent living  Communication skills  Motivation for treatment/growth   Treatment Modalities: Medication Management, Group therapy, Case management,  1 to 1 session with clinician, Psychoeducation, Recreational therapy.   Physician Treatment Plan for Primary Diagnosis: Major depressive disorder, recurrent episode, severe ([place]) Long Term Goal(s): Improvement in symptoms so as ready for discharge   Short Term Goals: Ability to identify changes in lifestyle to reduce recurrence of condition will improve Ability to verbalize feelings will improve Ability to disclose and discuss suicidal ideas Ability to demonstrate self-control will improve Ability to identify and develop effective coping behaviors will improve Ability to maintain clinical measurements within normal limits will improve Compliance with prescribed medications will improve Ability to identify triggers associated with substance abuse/mental health issues will improve  Medication Management: Evaluate patient's response, side effects, and tolerance of medication regimen.  Therapeutic Interventions: 1 to 1 sessions, Unit Group sessions and Medication administration.  Evaluation of Outcomes: Not Met  Physician Treatment Plan for Secondary Diagnosis: Principal Problem:   Major depressive disorder, recurrent episode, severe (HCC) Active Problems:   GAD (generalized anxiety disorder)   Alcohol use disorder, severe, dependence ([place])   PTSD (post-traumatic stress disorder)  Long Term Goal(s): Improvement in symptoms so as ready for discharge   Short Term Goals: Ability to identify changes in lifestyle to reduce recurrence of condition will improve Ability to verbalize feelings will improve Ability to disclose and discuss suicidal ideas Ability to demonstrate self-control will improve Ability to identify and develop effective coping behaviors will improve Ability to maintain clinical  measurements within normal limits will improve Compliance with prescribed medications will improve Ability to identify triggers associated with substance abuse/mental health issues will improve     Medication Management: Evaluate patient's response, side effects, and tolerance of medication regimen.  Therapeutic Interventions: 1 to 1 sessions, Unit Group sessions and Medication administration.  Evaluation of Outcomes: Not Met   RN Treatment Plan for Primary Diagnosis: Major depressive disorder, recurrent episode, severe ([place]) Long Term Goal(s): Knowledge of disease and therapeutic regimen to maintain health will improve  Short Term Goals: Ability to remain free from injury will improve, Ability to participate in decision making will improve, Ability to verbalize feelings will improve, Ability to disclose and discuss suicidal ideas, and Ability to identify and develop effective coping behaviors will improve  Medication  Management: RN will administer medications as ordered by provider, will assess and evaluate patient's response and provide education to patient for prescribed medication. RN will report any adverse and/or side effects to prescribing provider.  Therapeutic Interventions: 1 on 1 counseling sessions, Psychoeducation, Medication administration, Evaluate responses to treatment, Monitor vital signs and CBGs as ordered, Perform/monitor CIWA, COWS, AIMS and Fall Risk screenings as ordered, Perform wound care treatments as ordered.  Evaluation of Outcomes: Not Met   LCSW Treatment Plan for Primary Diagnosis: Major depressive disorder, recurrent episode, severe ([place]) Long Term Goal(s): Safe transition to appropriate next level of care at discharge, Engage patient in therapeutic group addressing interpersonal concerns.  Short Term Goals: Engage patient in aftercare planning with referrals and resources, Increase social support, Increase emotional regulation, Facilitate acceptance of  mental health diagnosis and concerns, Identify triggers associated with mental health/substance abuse issues, and Increase skills for wellness and recovery  Therapeutic Interventions: Assess for all discharge needs, 1 to 1 time with Social worker, Explore available resources and support systems, Assess for adequacy in community support network, Educate family and significant other(s) on suicide prevention, Complete Psychosocial Assessment, Interpersonal group therapy.  Evaluation of Outcomes: Not Met   Progress in Treatment: Attending groups: No. Participating in groups: No. Taking medication as prescribed: Yes. Toleration medication: Yes. Family/Significant other contact made: Yes, individual(s) contacted:  Mother  Patient understands diagnosis: Yes. Discussing patient identified problems/goals with staff: Yes. Medical problems stabilized or resolved: Yes. Denies suicidal/homicidal ideation: Yes. Issues/concerns per patient self-inventory: No.  New problem(s) identified: No, Describe:  None   New Short Term/Long Term Goal(s): medication stabilization, elimination of SI thoughts, development of comprehensive mental wellness plan.   Patient Goals: "To take care of my anger"   Discharge Plan or Barriers: Patient recently admitted. CSW will continue to follow and assess for appropriate referrals and possible discharge planning.   Reason for Continuation of Hospitalization: Anxiety Depression Medication stabilization Suicidal ideation Withdrawal symptoms  Estimated Length of Stay: 3 to 5 days    Scribe for Treatment Team: Darleen Crocker, Latanya Presser 10/25/2021 2:52 PM

## 2021-10-26 DIAGNOSIS — F333 Major depressive disorder, recurrent, severe with psychotic symptoms: Secondary | ICD-10-CM | POA: Diagnosis not present

## 2021-10-26 DIAGNOSIS — R45851 Suicidal ideations: Secondary | ICD-10-CM | POA: Diagnosis not present

## 2021-10-26 LAB — T4, FREE: Free T4: 0.87 ng/dL (ref 0.61–1.12)

## 2021-10-26 LAB — LIPASE, BLOOD: Lipase: 28 U/L (ref 11–51)

## 2021-10-26 MED ORDER — NICOTINE 21 MG/24HR TD PT24
21.0000 mg | MEDICATED_PATCH | Freq: Every day | TRANSDERMAL | Status: DC
  Administered 2021-10-26 – 2021-11-02 (×8): 21 mg via TRANSDERMAL
  Filled 2021-10-26 (×9): qty 1

## 2021-10-26 MED ORDER — DULOXETINE HCL 30 MG PO CPEP
30.0000 mg | ORAL_CAPSULE | Freq: Every day | ORAL | Status: DC
  Administered 2021-10-27: 30 mg via ORAL
  Filled 2021-10-26 (×3): qty 1

## 2021-10-26 MED ORDER — TRAZODONE HCL 150 MG PO TABS
150.0000 mg | ORAL_TABLET | Freq: Every evening | ORAL | Status: DC | PRN
  Administered 2021-10-26: 150 mg via ORAL
  Filled 2021-10-26: qty 1

## 2021-10-26 MED ORDER — GABAPENTIN 300 MG PO CAPS
600.0000 mg | ORAL_CAPSULE | Freq: Three times a day (TID) | ORAL | Status: DC
  Administered 2021-10-26 – 2021-10-31 (×15): 600 mg via ORAL
  Filled 2021-10-26 (×17): qty 2

## 2021-10-26 MED ORDER — TRAZODONE HCL 150 MG PO TABS
150.0000 mg | ORAL_TABLET | Freq: Every day | ORAL | Status: DC
  Administered 2021-10-27 – 2021-11-01 (×6): 150 mg via ORAL
  Filled 2021-10-26 (×8): qty 1

## 2021-10-26 NOTE — Progress Notes (Signed)
Patient did not attend wrap up group. 

## 2021-10-26 NOTE — Progress Notes (Signed)
Orientation/Goals group did not occur due to staffing and nursing staff in meeting and was unable to hold hall board.  Nutrition group was held @ 0930.

## 2021-10-26 NOTE — Group Note (Unsigned)
Group Topic: Goal Setting  Group Date: 10/26/2021 Start Time: 0900 End Time: 1000 Facilitators: Guss Bunde  [facility]  Number of Participants: 10  Group Focus: other Treatment Modality:   Interventions utilized were  Purpose:    Name: John Vaughan Date of Birth: 08-23-1991  MR: [id number]    Level of Participation: {THERAPIES; PSYCH GROUP PARTICIPATION DXIPJ:82505} Quality of Participation: {THERAPIES; PSYCH QUALITY OF PARTICIPATION:23992} Interactions with others: {THERAPIES; PSYCH INTERACTIONS:23993} Mood/Affect: {THERAPIES; PSYCH MOOD/AFFECT:23994} Triggers (if applicable): *** Cognition: {THERAPIES; PSYCH COGNITION:23995} Progress: {THERAPIES; PSYCH PROGRESS:23997} Response: *** Plan: {THERAPIES; PSYCH LZJQ:73419}  Patients Problems:  Patient Active Problem List   Diagnosis Date Noted   Major depressive disorder, recurrent episode, severe (HCC) 10/25/2021   PTSD (post-traumatic stress disorder) 10/25/2021   MDD (major depressive disorder), recurrent episode, severe (HCC) 06/06/2021   Suicidal ideations 05/24/2021   MDD (major depressive disorder), recurrent severe, without psychosis (HCC) 05/24/2021   Depression 03/20/2021   Compression fracture of T7 vertebra (HCC) 03/05/2021   COVID-19 virus infection 03/05/2021   Malingering 12/12/2020   Alcohol use, unspecified with withdrawal delirium (HCC) 12/03/2020   Alcohol withdrawal (HCC) 09/06/2020   Chronic low back pain 07/14/2020   Personal history of scoliosis 07/14/2020   Cannabis use disorder, severe, dependence (HCC) 06/06/2020   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

## 2021-10-26 NOTE — Progress Notes (Addendum)
[facility] Medical Student Progress Note  10/26/2021 4:23 PM John Vaughan  MRN:  [id number] Principal Problem: Major depressive disorder, recurrent episode, severe (HCC) Diagnosis: Principal Problem:   Major depressive disorder, recurrent episode, severe (HCC) Active Problems:   GAD (generalized anxiety disorder)   Alcohol use disorder, severe, dependence (HCC)   PTSD (post-traumatic stress disorder)  Subjective:  John Vaughan is a 30 yo M with PMH of major depressive disorder, generalized anxiety disorder with panic attacks, PTSD, and alcohol use disorder (with a history of alcohol withdrawal seizures), who presents for evaluation for worsening depression and suicidal thoughts.   During interview today, pt reports he is doing "terrible" after being notified about the restraining order his mother filed on him yesterday. He doesn't understand why this was done, stating that he was angry after falling the other day but that he didn't throw anything or harm her in any way. He later states that it's possible he could've blacked out and harmed her unintentionally, but doesn't recall anything happening. He reports that his depression and anxiety are unchanged from yesterday.  He reports that he was not able to sleep well last night and requested that we increase his trazodone to his home dose of 150 mg nightly, which we discussed would be fine to do. He reports that his appetite has improved since yesterday.  He reports rib pain due to his fall and asked for stronger pain medication. We discussed this wasn't a good option due to his hx of substance abuse and encouraged that he fully utilize his prn tylenol and ibuprofen.  He expresses concern for where he will go upon discharge since he will not be able to return living with his mom. He is not interested in inpatient rehab for SUD. We discussed that we would have SW look into options.   Total Time spent with patient: 20 minutes  Past Psychiatric  History: Previous Psych Diagnoses: MDD, ADHD, Withdrawal seizures from AUD Previous inpatient treatment: [facility] 05/24/2021 and 06/06/21 for SI and worsening alcohol use, 12/10/20 [facility] 12/10/21 SI, [facility] 12/03/20 alcohol induced depression, [facility] 07/13/20 MDD, seizures Current/Prior outpatient treatment: gabapentin, carbamazepine, zoloft Psychiatric medication hx: gabapentin, zoloft, carbamazepine, klonopin  Psychiatric medication compliance hx: compliant prior to running out and not being able to afford 2-3 months ago  Past Medical History:  Past Medical History:  Diagnosis Date   Alcohol abuse    Anxiety    Depression    History reviewed. No pertinent surgical history. Family History:  Family History  Problem Relation Age of Onset   Anxiety disorder Mother    Alcohol abuse Father    Anxiety disorder Maternal Aunt    Anxiety disorder Maternal Grandmother    Alcohol abuse Paternal Grandfather    Family Psychiatric  History:  Diagnoses: bipolar disorder, depression, anxiety on maternal and paternal side Rx: not discussed SA/HA: none known  Social History:  Social History   Substance and Sexual Activity  Alcohol Use Yes   Alcohol/week: 2.0 standard drinks   Types: 2 Cans of beer per week   Comment: states that he drinks 2 beers daily     Social History   Substance and Sexual Activity  Drug Use Not Currently   Types: Marijuana, Cocaine   Comment: cocaine in last month    Social History   Socioeconomic History   Marital status: Single    Spouse name: Not on file   Number of children: Not on file   Years of education: Not on  file   Highest education level: Not on file  Occupational History   Not on file  Tobacco Use   Smoking status: Every Day    Packs/day: 1.50    Types: Cigarettes   Smokeless tobacco: Former    Types: Snuff  Vaping Use   Vaping Use: Never used  Substance and Sexual Activity   Alcohol use: Yes    Alcohol/week: 2.0 standard drinks    Types: 2 Cans  of beer per week    Comment: states that he drinks 2 beers daily   Drug use: Not Currently    Types: Marijuana, Cocaine    Comment: cocaine in last month   Sexual activity: Not Currently  Other Topics Concern   Not on file  Social History Narrative   Not on file   Social Determinants of Health   Financial Resource Strain: Not on file  Food Insecurity: Not on file  Transportation Needs: Not on file  Physical Activity: Not on file  Stress: Not on file  Social Connections: Not on file    Sleep: Poor  Appetite:  Good  Current Medications: Current Facility-Administered Medications  Medication Dose Route Frequency Provider Last Rate Last Admin   acetaminophen (TYLENOL) tablet 650 mg  650 mg Oral Q6H PRN Carlyn Reichert, MD       Or   ibuprofen (ADVIL) tablet 400 mg  400 mg Oral Q6H PRN Carlyn Reichert, MD   400 mg at 10/26/21 0816   alum & mag hydroxide-simeth (MAALOX/MYLANTA) 200-200-20 MG/5ML suspension 30 mL  30 mL Oral Q4H PRN Novella Olive, NP       carbamazepine (TEGRETOL) chewable tablet 100 mg  100 mg Oral TID Novella Olive, NP   100 mg at 10/26/21 1214   dicyclomine (BENTYL) capsule 10 mg  10 mg Oral TID AC & HS Novella Olive, NP   10 mg at 10/26/21 1214   [START ON 10/27/2021] DULoxetine (CYMBALTA) DR capsule 30 mg  30 mg Oral Daily Carlyn Reichert, MD       gabapentin (NEURONTIN) capsule 600 mg  600 mg Oral TID Carlyn Reichert, MD       hydrOXYzine (ATARAX/VISTARIL) tablet 25 mg  25 mg Oral Q6H PRN Novella Olive, NP   25 mg at 10/25/21 2126   lidocaine (LIDODERM) 5 % 2 patch  2 patch Transdermal Q24H Roselle Locus, MD   2 patch at 10/25/21 1853   loperamide (IMODIUM) capsule 2-4 mg  2-4 mg Oral PRN Novella Olive, NP       LORazepam (ATIVAN) tablet 1 mg  1 mg Oral BID Novella Olive, NP       Followed by   Melene Muller ON 10/28/2021] LORazepam (ATIVAN) tablet 1 mg  1 mg Oral Daily Novella Olive, NP       LORazepam (ATIVAN) tablet 1 mg  1 mg Oral Q6H PRN Carlyn Reichert, MD       magnesium hydroxide (MILK OF MAGNESIA) suspension 30 mL  30 mL Oral Daily PRN Novella Olive, NP       multivitamin with minerals tablet 1 tablet  1 tablet Oral Daily Novella Olive, NP   1 tablet at 10/26/21 0812   ondansetron (ZOFRAN-ODT) disintegrating tablet 4 mg  4 mg Oral Q6H PRN Novella Olive, NP       pantoprazole (PROTONIX) EC tablet 40 mg  40 mg Oral Daily Novella Olive, NP   40 mg at 10/26/21 [phone]  thiamine tablet 100 mg  100 mg Oral Daily Novella Olive, NP   100 mg at 10/26/21 0813   traZODone (DESYREL) tablet 150 mg  150 mg Oral QHS PRN Carlyn Reichert, MD        Lab Results:  Results for orders placed or performed during the hospital encounter of 10/24/21 (from the past 48 hour(s))  Rapid urine drug screen (hospital performed)     Status: Abnormal   Collection Time: 10/24/21  4:55 PM  Result Value Ref Range   Opiates NONE DETECTED NONE DETECTED   Cocaine POSITIVE (A) NONE DETECTED   Benzodiazepines NONE DETECTED NONE DETECTED   Amphetamines NONE DETECTED NONE DETECTED   Tetrahydrocannabinol NONE DETECTED NONE DETECTED   Barbiturates NONE DETECTED NONE DETECTED    Comment: (NOTE) DRUG SCREEN FOR MEDICAL PURPOSES ONLY.  IF CONFIRMATION IS NEEDED FOR ANY PURPOSE, NOTIFY LAB WITHIN 5 DAYS.  LOWEST DETECTABLE LIMITS FOR URINE DRUG SCREEN Drug Class                     Cutoff (ng/mL) Amphetamine and metabolites    1000 Barbiturate and metabolites    200 Benzodiazepine                 200 Tricyclics and metabolites     300 Opiates and metabolites        300 Cocaine and metabolites        300 THC                            50 Performed at [facility], [address] [place], [place] [address]   Hemoglobin A1c     Status: None   Collection Time: 10/25/21  6:26 AM  Result Value Ref Range   Hgb A1c MFr Bld 5.2 4.8 - 5.6 %    Comment: (NOTE) Pre diabetes:          5.7%-6.4%  Diabetes:              >6.4%  Glycemic control for    <7.0% adults with diabetes    Mean Plasma Glucose 102.54 mg/dL    Comment: Performed at [facility], [address], [place], [place] [address]  Lipid panel     Status: Abnormal   Collection Time: 10/25/21  6:26 AM  Result Value Ref Range   Cholesterol 208 (H) 0 - 200 mg/dL   Triglycerides 397 (H) <150 mg/dL   HDL 71 >67 mg/dL   Total CHOL/HDL Ratio 2.9 RATIO   VLDL 44 (H) 0 - 40 mg/dL   LDL Cholesterol 93 0 - 99 mg/dL    Comment:        Total Cholesterol/HDL:CHD Risk Coronary Heart Disease Risk Table                     Men   Women  1/2 Average Risk   3.4   3.3  Average Risk       5.0   4.4  2 X Average Risk   9.6   7.1  3 X Average Risk  23.4   11.0        Use the calculated Patient Ratio above and the CHD Risk Table to determine the patient's CHD Risk.        ATP III CLASSIFICATION (LDL):  <100     mg/dL   Optimal  341-937  mg/dL  Near or Above                    Optimal  130-159  mg/dL   Borderline  027-741  mg/dL   High  >287     mg/dL   Very High Performed at [facility], [address], [place], [place] [address]   TSH     Status: Abnormal   Collection Time: 10/25/21  6:26 AM  Result Value Ref Range   TSH 6.480 (H) 0.350 - 4.500 uIU/mL    Comment: Performed by a 3rd Generation assay with a functional sensitivity of <=0.01 uIU/mL. Performed at [facility], [address] [place], [place] [address]   Carbamazepine level, total     Status: Abnormal   Collection Time: 10/25/21  6:26 AM  Result Value Ref Range   Carbamazepine Lvl <2.0 (L) 4.0 - 12.0 ug/mL    Comment: Performed at [facility], [address] [place], [place] [address]  Lipase, blood     Status: None   Collection Time: 10/26/21  6:17 AM  Result Value Ref Range   Lipase 28 11 - 51 U/L    Comment: Performed at [facility], [address] [place], [place] [address]  T4, free     Status: None   Collection Time: 10/26/21   6:17 AM  Result Value Ref Range   Free T4 0.87 0.61 - 1.12 ng/dL    Comment: (NOTE) Biotin ingestion may interfere with free T4 tests. If the results are inconsistent with the TSH level, previous test results, or the clinical presentation, then consider biotin interference. If needed, order repeat testing after stopping biotin. Performed at [facility], [address] [place], [place] [address]     Blood Alcohol level:  Lab Results  Component Value Date   ETH 288 (H) 10/24/2021   ETH 261 (H) 06/05/2021    Metabolic Disorder Labs: Lab Results  Component Value Date   HGBA1C 5.2 10/25/2021   MPG 102.54 10/25/2021   No results found for: PROLACTIN Lab Results  Component Value Date   CHOL 208 (H) 10/25/2021   TRIG 218 (H) 10/25/2021   HDL 71 10/25/2021   CHOLHDL 2.9 10/25/2021   VLDL 44 (H) 10/25/2021   LDLCALC 93 10/25/2021    Physical Findings: AIMS:  , ,  ,  ,    CIWA:  CIWA-Ar Total: 4 COWS:     Musculoskeletal: Strength & Muscle Tone: within normal limits Gait & Station: normal Patient leans: N/A  Psychiatric Specialty Exam:  Presentation  General Appearance: -- (poorly groomed in dirty ill fitting clothing)  Eye Contact:Fair  Speech:Normal Rate  Speech Volume:Normal  Handedness:Right   Mood and Affect  Mood:Angry; Anxious; Depressed; Dysphoric; Irritable  Affect:Constricted; Congruent   Thought Process  Thought Processes:Linear  Descriptions of Associations:Intact  Orientation:Full (Time, Place and Person)  Thought Content:Logical  History of Schizophrenia/Schizoaffective disorder:No  Duration of Psychotic Symptoms:No data recorded Hallucinations:Hallucinations: None  Ideas of Reference:None  Suicidal Thoughts:Suicidal Thoughts: No SI Passive Intent and/or Plan: Without Intent; Without Plan  Homicidal Thoughts:Homicidal Thoughts: No   Sensorium  Memory:Immediate Fair; Recent Fair; Remote  Fair  Judgment:Poor  Insight:Fair   Executive Functions  Concentration:Fair  Attention Span:Fair  Recall:Fair  Fund of Knowledge:Fair  Language:Fair   Psychomotor Activity  Psychomotor Activity:Psychomotor Activity: Normal   Assets  Assets:Resilience; Desire for Improvement; Physical Health   Sleep  Sleep:Sleep: Poor  Physical Exam: Physical Exam HENT:     Head: Normocephalic and atraumatic.  Pulmonary:     Effort: Pulmonary effort is normal.  Neurological:     Mental Status: He is alert.  Psychiatric:        Thought Content: Thought content normal.   Review of Systems  Musculoskeletal:        Rib pain from fall  Psychiatric/Behavioral:  Positive for depression. Negative for hallucinations and suicidal ideas. The patient is nervous/anxious and has insomnia.   Blood pressure 117/70, pulse (!) 57, temperature 97.6 F (36.4 C), temperature source Oral, resp. rate 16, height 6' (1.829 m), weight 78.5 kg, SpO2 100 %. Body mass index is 23.46 kg/m.   Treatment Plan Summary: Daily contact with patient to assess and evaluate symptoms and progress in treatment and Medication management   Diagnoses / Active Problems: Major depressive disorder, severe, recurrent, without psychotic features Generalized anxiety disorder panic attacks PTSD Alcohol use disorder, severe Reported history of ADHD  PLAN: Safety and Monitoring:             -- Voluntary admission to inpatient psychiatric unit for safety, stabilization and treatment             -- Daily contact with patient to assess and evaluate symptoms and progress in treatment             -- Patient's case to be discussed in multi-disciplinary team meeting             -- Observation Level : q15 minute checks             -- Vital signs:  q12 hours             -- Precautions: suicide, elopement, and assault   2. Psychiatric Diagnoses and Treatment:  - INCREASE Cymbalta to 30 mg once daily, for mood, anxiety,  PTSD, reported neuropathic pain - Continue carbamazepine 100 mg 3 times daily for mood stabilization and seizure prophylaxis - INCREASE gabapentin to 600 mg 3 times daily for anxiety and neuropathy - INCREASE trazodone to 150 mg as needed nightly for sleep - Continue CIWA with scheduled Ativan and also as needed Ativan for CIWA score greater than 8 - Continue other medications as ordered  3. Medical Issues Being Addressed:              Tobacco Use Disorder             -- Nicotine patch /24 hours              -- Smoking cessation encouraged  Sharene Skeans, Medical Student 10/26/2021, 4:23 PM  Total Time Spent in Direct Patient Care:  I personally spent 30 minutes on the unit in direct patient care. The direct patient care time included face-to-face time with the patient, reviewing the patient's chart, communicating with other professionals, and coordinating care. Greater than 50% of this time was spent in counseling or coordinating care with the patient regarding goals of hospitalization, psycho-education, and discharge planning needs.  I personally was present and performed or re-performed the history, physical exam and medical decision-making activities of this service and have verified that the service and findings are accurately documented in the student's note, , as addended by me or notated below:  I directly edited the note, as above.    Phineas Inches, MD Psychiatrist

## 2021-10-26 NOTE — Progress Notes (Signed)
DAR NOTE: Patient presents with anxious affect and mood.  Denies suicidal thoughts, auditory and visual hallucinations.  Described energy level as low with poor concentration.  Rates depression at 9, hopelessness at 10, and anxiety at 8.  Maintained on routine safety checks.  Medications given as prescribed.  Support and encouragement offered as needed.  Attended group and participated.  States goal for today is "be happy."  Patient observed socializing with peers in the dayroom.  Patient is safe on and off the unit.

## 2021-10-26 NOTE — BHH Counselor (Addendum)
CSW spoke with [facility] [employer] who states that the Pt has a warrant for his arrest.  They state that the paperwork the CSW needs is an Order to [employer] and that the [employer] will be providing that to pick the Pt up after discharge.  They state that Karilyn Cota will be bringing that paperwork to the hospital.  CSW Spoke with Mr. Dan Humphreys [phone] who stated that he would be bringing that paperwork by today.  Once CSW receives that paperwork it will be placed in the Pt's file.  This paperwork will allow the Pt to be picked up by the police at discharge due to his warrant.

## 2021-10-26 NOTE — BHH Counselor (Signed)
Adult Comprehensive Assessment  Patient ID: John Vaughan, male   DOB: [birth year], 30 y.o.   MRN: [id number]  Information Source: Information source: Patient   Current Stressors:  Patient states their primary concerns and needs for treatment are:: "I fell down some stairs and hurt my ribs"  Patient states their goals for this hospitilization and ongoing recovery are:: "To go home" Educational / Learning stressors: Pt reports no stressors  Employment / Job issues: Pt reports being unemployed Family Relationships: Pt reports conflict with mother  Museum/gallery curator / Lack of resources (include bankruptcy): Pt reports limited income, Mother is a Firefighter / Lack of housing: Pt reports living with his mother  Physical health (include injuries & life threatening diseases): Pt reports pain in ribs  Social relationships: Pt reports few social supports  Substance abuse: Pt reports drinking 1 to 2 40oz beers every other day and using Cocaine once a week  Bereavement / Loss: Pt reports father passed away 1 years ago Oct 29, 2021   Living/Environment/Situation:  Living Arrangements: Parent/Home Living conditions (as described by patient or guardian): "It's completely stressful; Needs met, we've got food, water, power" Who else lives in the home?: Mother. How long has patient lived in current situation?: "My whole life" What is atmosphere in current home: Chaotic   Family History:  Marital status: Single Are you sexually active?: No What is your sexual orientation?: Heterosexual Has your sexual activity been affected by drugs, alcohol, medication, or emotional stress?: "Yes" Lack of desire. Does patient have children?: Yes How many children?: 1 How is patient's relationship with their children?: "He is 30 years old now but I don't get to talk to him"    Childhood History:  By whom was/is the patient raised?: Both parents Additional childhood history information: "Other than  figuring out what struggling is, it was great" Description of patient's relationship with caregiver when they were a child: "It was definitely good, I was a wild child but we still got along" Patient's description of current relationship with people who raised him/her: "Pt's father died in 10-27-20 and he states he's been "completely depressed" since that time. I'm the only one that will take care of mom." How were you disciplined when you got in trouble as a child/adolescent?: "Spankings, grounding, normal [place] way" Does patient have siblings?: Yes Number of Siblings: 2 Description of patient's current relationship with siblings: "Both older, decent relationship, they've got their own family's so they do their own things" Did patient suffer any verbal/emotional/physical/sexual abuse as a child?: No Did patient suffer from severe childhood neglect?: No Has patient ever been sexually abused/assaulted/raped as an adolescent or adult?: No Was the patient ever a victim of a crime or a disaster?: No Witnessed domestic violence?: No Has patient been affected by domestic violence as an adult?: No   Education:  Highest grade of school patient has completed: Graduated high school Currently a student?: No Learning disability?: Yes What learning problems does patient have?: ADHD   Employment/Work Situation:   Employment situation: Unemployed Patient's job has been impacted by current illness: Yes "I have seizures from my alcohol use" Describe how patient's job has been impacted: Liability for potential employeers due to alcohol use. 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 Has patient ever been in the [employer]?: No   Financial Resources:   Financial resources: Food stamps,Income from  employment (odd jobs) Does patient have a Programmer, applications or guardian?: No   Alcohol/Substance  Abuse:   What has been your use of drugs/alcohol within the last 12 months?: Pt reports drinking 1 to 2 40oz beers every other day and using Cocaine once a week  If attempted suicide, did drugs/alcohol play a role in this?: No Alcohol/Substance Abuse Treatment Hx: Past detox If yes, describe treatment: "Just detox, tried AA once but didn't like it" Has alcohol/substance abuse ever caused legal problems?: Yes (May 2021)   Social Support System:   Patient's Community Support System: Good Describe Community Support System: "My mom and my brothers" Type of faith/religion: Darrick Meigs How does patient's faith help to cope with current illness?: Prayer   Leisure/Recreation:   Do You Have Hobbies?: Yes Leisure and Hobbies: Pt reported working on cars, play guitar, watching TV    Strengths/Needs:   What is the patient's perception of their strengths?: Mechanics  Patient states they can use these personal strengths during their treatment to contribute to their recovery: "I'm not sure"    Discharge Plan:   Currently receiving community mental health services: No Patient states concerns and preferences for aftercare planning are: Pt is interested in therapy and medication management only  Patient states they will know when they are safe and ready for discharge when: "When I don't feel hopeless anymore"  Does patient have access to transportation?: Yes (Pt shares a car with his mother) Does patient have financial barriers related to discharge medications?: Yes Patient description of barriers related to discharge medications: No insurance, no income  Will patient be returning to same living situation after discharge?: Yes  Summary/Recommendations:   Summary and Recommendations (to be completed by the evaluator): John Vaughan is a 30 year old, male, who was admitted to the hospital due to worsening depression, substance abuse, and suicidal thoughts.  The Pt reports that he is living with his mother  and that the environment is often chaotic, involving arguments and substance use.  The Pt reports that his father passed away 1 year ago on 11/05/21 and that this John Vaughan has caused a lot of tension between him and his mother.  He states that he has no income, medical insurance, and shares transportation with his mother.  The Pt reports that he recently fell down a flight of stairs and injured his ribs.  He also reports having seizures due to his alcohol use.  The Pt reports that he has reduced his alcohol use from daily to every other day and reports drinking approximately 1 to 2 40oz bottles each time.  The Pt reports that his Cocaine use has increased to once a week as well.  The Pt denies all current and previous substance use treatment and states that he is not interested in attending any inpatient or outpatient substance use treatment at this time.  While in the hospital the Pt can benefit from crisis stabilization, medication evaluation, group therapy, psycho-education, case management, and discharge planning.  Upon discharge the Pt would like to return to his home with his mother and follow up with [facility] for therapy and medication management.  Darleen Crocker. 10/26/2021

## 2021-10-26 NOTE — Plan of Care (Signed)
?  Problem: Education: ?Goal: Emotional status will improve ?Outcome: Not Progressing ?Goal: Mental status will improve ?Outcome: Not Progressing ?  ?Problem: Activity: ?Goal: Sleeping patterns will improve ?Outcome: Not Progressing ?  ?

## 2021-10-26 NOTE — Progress Notes (Signed)
     10/26/21 2143  Psych Admission Type (Psych Patients Only)  Admission Status Voluntary  Psychosocial Assessment  Patient Complaints Anxiety;Depression  Eye Contact Fair  Facial Expression Anxious  Affect Sad  Speech Logical/coherent  Interaction Assertive  Motor Activity Slow  Appearance/Hygiene Disheveled  Behavior Characteristics Cooperative  Mood Pleasant;Anxious  Thought Process  Coherency WDL  Content WDL  Delusions None reported or observed  Perception Hallucinations  Hallucination Visual ("Pt reported seeing lines" & "things moving in slow motion")  Judgment Poor  Confusion WDL  Danger to Self  Current suicidal ideation? Denies  Self-Injurious Behavior Some self-injurious ideation observed or expressed.  No lethal plan expressed   Agreement Not to Harm Self Yes  Description of Agreement verbal contract for safety  Danger to Others  Danger to Others None reported or observed

## 2021-10-26 NOTE — BHH Counselor (Signed)
CSW spoke with John Vaughan who provided the CSW with the phone number to [facility] [phone].  CSW spoke with John Vaughan who states that she does see the warrant and an order for his arrest but states that there is currently no order to disclose.  She states that an order to disclose will be ordered but it will be tomorrow before it will be sent to [facility].  CSW advised John Vaughan that she may ask for any CSW to deliver this order to when bringing it to the facility or when calling on Friday afternoon if CSW Lawanna Kobus is not available.

## 2021-10-27 DIAGNOSIS — R45851 Suicidal ideations: Secondary | ICD-10-CM | POA: Diagnosis not present

## 2021-10-27 DIAGNOSIS — F333 Major depressive disorder, recurrent, severe with psychotic symptoms: Secondary | ICD-10-CM | POA: Diagnosis not present

## 2021-10-27 MED ORDER — IBUPROFEN 200 MG PO TABS
100.0000 mg | ORAL_TABLET | Freq: Four times a day (QID) | ORAL | Status: DC | PRN
  Administered 2021-10-27 – 2021-11-01 (×7): 100 mg via ORAL
  Filled 2021-10-27 (×8): qty 1

## 2021-10-27 MED ORDER — DULOXETINE HCL 20 MG PO CPEP
40.0000 mg | ORAL_CAPSULE | Freq: Every day | ORAL | Status: DC
  Administered 2021-10-28: 40 mg via ORAL
  Filled 2021-10-27 (×4): qty 2

## 2021-10-27 MED ORDER — HYDROXYZINE HCL 25 MG PO TABS
25.0000 mg | ORAL_TABLET | Freq: Three times a day (TID) | ORAL | Status: DC | PRN
  Administered 2021-10-28 – 2021-10-29 (×5): 25 mg via ORAL
  Filled 2021-10-27 (×5): qty 1

## 2021-10-27 MED ORDER — ACETAMINOPHEN 325 MG PO TABS
650.0000 mg | ORAL_TABLET | Freq: Four times a day (QID) | ORAL | Status: DC | PRN
  Administered 2021-10-29: 650 mg via ORAL
  Filled 2021-10-27: qty 2

## 2021-10-27 NOTE — Group Note (Signed)
Recreation Therapy Group Note   Group Topic:Stress Management  Group Date: 10/27/2021 Start Time: 0930 End Time: 0950 Facilitators: Caroll Rancher, LRT/CTRS Location: [address] Dayroom   Goal Area(s) Addresses:  Patient will identify positive stress management techniques. Patient will identify benefits of using stress management post d/c.  Group Description:  Meditation.  LRT played a meditation that focused on forgiveness of self and how it can be hard to show grace to oneself after a bad decision or not accomplishing a goal.  Patients were to listen and follow along with the meditation as it played to engage in activity.  LRT also gave patients options on platforms to use other stress management techniques such as Youtube, Apps and the Internet.     Affect/Mood: Appropriate   Participation Level: Engaged   Participation Quality: Independent   Behavior: Appropriate   Speech/Thought Process: Focused   Insight: Good   Judgement: Good   Modes of Intervention: Meditation   Patient Response to Interventions:  Engaged   Education Outcome:  Acknowledges education and In group clarification offered    Clinical Observations/Individualized Feedback: Pt attended and participated in group.    Plan: Continue to engage patient in RT group sessions 2-3x/week.   Caroll Rancher, LRT/CTRS 10/27/2021 11:48 AM

## 2021-10-27 NOTE — Progress Notes (Signed)
   10/27/21 1000  Psych Admission Type (Psych Patients Only)  Admission Status Voluntary  Psychosocial Assessment  Patient Complaints Depression;Anxiety  Eye Contact Fair  Facial Expression Anxious  Affect Sad  Speech Logical/coherent  Interaction Assertive  Motor Activity Slow  Appearance/Hygiene Disheveled  Behavior Characteristics Cooperative  Mood Anxious  Thought Process  Coherency WDL  Content WDL  Delusions None reported or observed  Perception Hallucinations  Hallucination Visual ("Pt reported seeing lines" & "things moving in slow motion")  Judgment Poor  Confusion WDL  Danger to Self  Current suicidal ideation? Denies  Self-Injurious Behavior Some self-injurious ideation observed or expressed.  No lethal plan expressed   Agreement Not to Harm Self Yes  Description of Agreement verbal contract for safety  Danger to Others  Danger to Others None reported or observed

## 2021-10-27 NOTE — Progress Notes (Signed)
[facility] Medical Student Progress Note  10/27/2021 6:07 PM Nasario Czerniak  MRN:  [id number] Principal Problem: Major depressive disorder, recurrent episode, severe (HCC) Diagnosis: Principal Problem:   Major depressive disorder, recurrent episode, severe (HCC) Active Problems:   GAD (generalized anxiety disorder)   Alcohol use disorder, severe, dependence (HCC)   PTSD (post-traumatic stress disorder)  Subjective:  Cole Eastridge is a 30 yo M with PMH of major depressive disorder, generalized anxiety disorder with panic attacks, PTSD, and alcohol use disorder (with a history of alcohol withdrawal seizures), who presents for evaluation for worsening depression and suicidal thoughts.   The patient's chart was reviewed and nursing notes were reviewed. Over the past 24 hrs, there were no documented behavioral issues, no PRN medications given for agitation.The patient's case was discussed in multidisciplinary team meeting.   On interview this morning, the patient reports feeling hopeless and his affect is downcast.  He reports that it is the anniversary of his father's death and also that his mother taking out a 50-B on him is causing his mood to be depressed.  He also reports chronic feelings of emptiness, relationship instability, as well as affective instability.  He complains of nausea which she relates to stress.  And he asked for more pain medication.  He is amenable to a plan of alternating Tylenol and Advil as well as increasing his Cymbalta for tomorrow.  He denies suicidal thoughts.  Total Time spent with patient: 20 minutes  Past Psychiatric History: Previous Psych Diagnoses: MDD, ADHD, Withdrawal seizures from AUD Previous inpatient treatment: [facility] 05/24/2021 and 06/06/21 for SI and worsening alcohol use, 12/10/20 [facility], [place] 12/10/21 SI, [facility] 12/03/20 alcohol induced depression, [facility] 07/13/20 MDD, seizures Current/Prior outpatient treatment: gabapentin, carbamazepine, zoloft Psychiatric  medication hx: gabapentin, zoloft, carbamazepine, klonopin  Psychiatric medication compliance hx: compliant prior to running out and not being able to afford 2-3 months ago  Past Medical History:  Past Medical History:  Diagnosis Date   Alcohol abuse    Anxiety    Depression    History reviewed. No pertinent surgical history. Family History:  Family History  Problem Relation Age of Onset   Anxiety disorder Mother    Alcohol abuse Father    Anxiety disorder Maternal Aunt    Anxiety disorder Maternal Grandmother    Alcohol abuse Paternal Grandfather    Family Psychiatric  History:  Diagnoses: bipolar disorder, depression, anxiety on maternal and paternal side Rx: not discussed SA/HA: none known  Social History:  Social History   Substance and Sexual Activity  Alcohol Use Yes   Alcohol/week: 2.0 standard drinks   Types: 2 Cans of beer per week   Comment: states that he drinks 2 beers daily     Social History   Substance and Sexual Activity  Drug Use Not Currently   Types: Marijuana, Cocaine   Comment: cocaine in last month    Social History   Socioeconomic History   Marital status: Single    Spouse name: Not on file   Number of children: Not on file   Years of education: Not on file   Highest education level: Not on file  Occupational History   Not on file  Tobacco Use   Smoking status: Every Day    Packs/day: 1.50    Types: Cigarettes   Smokeless tobacco: Former    Types: Snuff  Vaping Use   Vaping Use: Never used  Substance and Sexual Activity   Alcohol use: Yes    Alcohol/week: 2.0  standard drinks    Types: 2 Cans of beer per week    Comment: states that he drinks 2 beers daily   Drug use: Not Currently    Types: Marijuana, Cocaine    Comment: cocaine in last month   Sexual activity: Not Currently  Other Topics Concern   Not on file  Social History Narrative   Not on file   Social Determinants of Health   Financial Resource Strain: Not on file   Food Insecurity: Not on file  Transportation Needs: Not on file  Physical Activity: Not on file  Stress: Not on file  Social Connections: Not on file    Sleep: Poor  Appetite:  Good  Current Medications: Current Facility-Administered Medications  Medication Dose Route Frequency Provider Last Rate Last Admin   acetaminophen (TYLENOL) tablet 650 mg  650 mg Oral Q6H PRN Carlyn Reichert, MD       alum & mag hydroxide-simeth (MAALOX/MYLANTA) 200-200-20 MG/5ML suspension 30 mL  30 mL Oral Q4H PRN Novella Olive, NP       carbamazepine (TEGRETOL) chewable tablet 100 mg  100 mg Oral TID Novella Olive, NP   100 mg at 10/27/21 1701   dicyclomine (BENTYL) capsule 10 mg  10 mg Oral TID AC & HS Novella Olive, NP   10 mg at 10/27/21 1701   [START ON 10/28/2021] DULoxetine (CYMBALTA) DR capsule 40 mg  40 mg Oral Daily Massengill, Nathan, MD       gabapentin (NEURONTIN) capsule 600 mg  600 mg Oral TID Carlyn Reichert, MD   600 mg at 10/27/21 1701   ibuprofen (ADVIL) tablet 100 mg  100 mg Oral Q6H PRN Carlyn Reichert, MD   100 mg at 10/27/21 1706   lidocaine (LIDODERM) 5 % 2 patch  2 patch Transdermal Q24H Roselle Locus, MD   2 patch at 10/27/21 1703   [START ON 10/28/2021] LORazepam (ATIVAN) tablet 1 mg  1 mg Oral Daily Novella Olive, NP       magnesium hydroxide (MILK OF MAGNESIA) suspension 30 mL  30 mL Oral Daily PRN Novella Olive, NP       multivitamin with minerals tablet 1 tablet  1 tablet Oral Daily Novella Olive, NP   1 tablet at 10/27/21 0827   nicotine (NICODERM CQ - dosed in mg/24 hours) patch 21 mg  21 mg Transdermal Daily Massengill, Harrold Donath, MD   21 mg at 10/27/21 0829   pantoprazole (PROTONIX) EC tablet 40 mg  40 mg Oral Daily Novella Olive, NP   40 mg at 10/27/21 0630   thiamine tablet 100 mg  100 mg Oral Daily Novella Olive, NP   100 mg at 10/27/21 1245   traZODone (DESYREL) tablet 150 mg  150 mg Oral QHS Massengill, Harrold Donath, MD        Lab Results:  Results for orders  placed or performed during the hospital encounter of 10/24/21 (from the past 48 hour(s))  Lipase, blood     Status: None   Collection Time: 10/26/21  6:17 AM  Result Value Ref Range   Lipase 28 11 - 51 U/L    Comment: Performed at [facility], [address] [place], [place] [address]  T4, free     Status: None   Collection Time: 10/26/21  6:17 AM  Result Value Ref Range   Free T4 0.87 0.61 - 1.12 ng/dL    Comment: (NOTE) Biotin ingestion may interfere with  free T4 tests. If the results are inconsistent with the TSH level, previous test results, or the clinical presentation, then consider biotin interference. If needed, order repeat testing after stopping biotin. Performed at [facility], [address] [place], [place] [address]     Blood Alcohol level:  Lab Results  Component Value Date   ETH 288 (H) 10/24/2021   ETH 261 (H) 06/05/2021    Metabolic Disorder Labs: Lab Results  Component Value Date   HGBA1C 5.2 10/25/2021   MPG 102.54 10/25/2021   No results found for: PROLACTIN Lab Results  Component Value Date   CHOL 208 (H) 10/25/2021   TRIG 218 (H) 10/25/2021   HDL 71 10/25/2021   CHOLHDL 2.9 10/25/2021   VLDL 44 (H) 10/25/2021   LDLCALC 93 10/25/2021    Physical Findings: CIWA:  CIWA-Ar Total: 0 COWS:     Musculoskeletal: Strength & Muscle Tone: within normal limits Gait & Station: normal Patient leans: N/A  Psychiatric Specialty Exam:  Presentation  General Appearance: Disheveled  Eye Contact:Fair  Speech:Clear and Coherent; Normal Rate  Speech Volume:Normal  Handedness:Right   Mood and Affect  Mood:Angry; Anxious; Depressed; Dysphoric; Irritable  Affect:Constricted; Congruent   Thought Process  Thought Processes:Coherent; Linear  Descriptions of Associations:Intact  Orientation:Full (Time, Place and Person)  Thought Content:Logical No suicidal thoughts  History of Schizophrenia/Schizoaffective  disorder:No  Duration of Psychotic Symptoms:No data recorded Hallucinations:Hallucinations: None  Ideas of Reference:None  Suicidal Thoughts:Suicidal Thoughts: No  Homicidal Thoughts:Homicidal Thoughts: No   Sensorium  Memory:Immediate Good; Recent Good  Judgment:Poor  Insight:Fair   Executive Functions  Concentration:Fair  Attention Span:Fair  Recall:Fair  Fund of Knowledge:Fair  Language:Good   Psychomotor Activity  Psychomotor Activity:Psychomotor Activity: Normal   Assets  Assets:Resilience; Desire for Improvement; Physical Health   Sleep  Sleep:Sleep: Poor    Physical Exam: Physical Exam HENT:     Head: Normocephalic and atraumatic.  Pulmonary:     Effort: Pulmonary effort is normal.  Neurological:     Mental Status: He is alert.  Psychiatric:        Thought Content: Thought content normal.   Review of Systems  Musculoskeletal:        Rib pain from fall  Psychiatric/Behavioral:  Positive for depression. Negative for hallucinations and suicidal ideas. The patient is nervous/anxious and has insomnia.   Blood pressure 134/87, pulse 88, temperature (!) 97.5 F (36.4 C), temperature source Oral, resp. rate 20, height 6' (1.829 m), weight 78.5 kg, SpO2 100 %. Body mass index is 23.46 kg/m.   Treatment Plan Summary: Daily contact with patient to assess and evaluate symptoms and progress in treatment and Medication management   Diagnoses / Active Problems: Major depressive disorder, severe, recurrent, without psychotic features Generalized anxiety disorder panic attacks PTSD Alcohol use disorder, severe Reported history of ADHD  PLAN: Safety and Monitoring:             -- Voluntary admission to inpatient psychiatric unit for safety, stabilization and treatment             -- Daily contact with patient to assess and evaluate symptoms and progress in treatment             -- Patient's case to be discussed in multi-disciplinary team meeting              -- Observation Level : q15 minute checks             -- Vital signs:  q12  hours             -- Precautions: suicide, elopement, and assault   2. Psychiatric Diagnoses and Treatment:  - INCREASE Cymbalta to 40 mg once daily starting on 11/5, for mood, anxiety, PTSD, reported neuropathic pain - Continue carbamazepine 100 mg 3 times daily for mood stabilization and seizure prophylaxis, level due 11/6 - Continue gabapentin 600 mg 3 times daily for anxiety and neuropathy - Continue trazodone 150 mg as needed nightly for sleep - Continue CIWA with scheduled Ativan and also as needed Ativan for CIWA score greater than 8 - Continue other medications as ordered  3. Medical Issues Being Addressed:              Tobacco Use Disorder             -- Nicotine patch 21mg /24 hours              -- Smoking cessation encouraged  06-11-1982, MD 10/27/2021, 6:07 PM

## 2021-10-27 NOTE — BHH Group Notes (Signed)
Patient did not attend group today.

## 2021-10-27 NOTE — Group Note (Signed)
LCSW Group Therapy Note   Group Date: 10/27/2021 Start Time: 1300 End Time: 1400   Type of Therapy and Topic:  Group Therapy: Boundaries  Participation Level:  Active  Description of Group: This group will address the use of boundaries in their personal lives. Patients will explore why boundaries are important, the difference between healthy and unhealthy boundaries, and negative and postive outcomes of different boundaries and will look at how boundaries can be crossed.  Patients will be encouraged to identify current boundaries in their own lives and identify what kind of boundary is being set. Facilitators will guide patients in utilizing problem-solving interventions to address and correct types boundaries being used and to address when no boundary is being used. Understanding and applying boundaries will be explored and addressed for obtaining and maintaining a balanced life. Patients will be encouraged to explore ways to assertively make their boundaries and needs known to significant others in their lives, using other group members and facilitator for role play, support, and feedback.  Therapeutic Goals:  1.  Patient will identify areas in their life where setting clear boundaries could be  used to improve their life.  2.  Patient will identify signs/triggers that a boundary is not being respected. 3.  Patient will identify two ways to set boundaries in order to achieve balance in  their lives: 4.  Patient will demonstrate ability to communicate their needs and set boundaries  through discussion and/or role plays  Summary of Patient Progress:  John Vaughan was present/active throughout the session and proved open to feedback from CSW and peers. Patient demonstrated good insight into the subject matter, was respectful of peers, and was present throughout the entire session.  Therapeutic Modalities:   Cognitive Behavioral Therapy Solution-Focused Therapy  Gardiner Sleeper Yaacov Koziol, LCSWA 10/27/2021   2:10 PM

## 2021-10-28 DIAGNOSIS — F411 Generalized anxiety disorder: Secondary | ICD-10-CM

## 2021-10-28 DIAGNOSIS — F4310 Post-traumatic stress disorder, unspecified: Secondary | ICD-10-CM

## 2021-10-28 DIAGNOSIS — F1020 Alcohol dependence, uncomplicated: Secondary | ICD-10-CM

## 2021-10-28 LAB — CARBAMAZEPINE LEVEL, TOTAL: Carbamazepine Lvl: 6.6 ug/mL (ref 4.0–12.0)

## 2021-10-28 MED ORDER — DULOXETINE HCL 20 MG PO CPEP
40.0000 mg | ORAL_CAPSULE | Freq: Every day | ORAL | Status: DC
  Administered 2021-10-29 – 2021-10-30 (×2): 40 mg via ORAL
  Filled 2021-10-28 (×4): qty 2

## 2021-10-28 MED ORDER — DULOXETINE HCL 60 MG PO CPEP: 60.0000 mg | ORAL_CAPSULE | Freq: Every day | ORAL | Status: DC
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
## 2021-10-28 NOTE — Progress Notes (Signed)
   10/28/21 0000  Psych Admission Type (Psych Patients Only)  Admission Status Voluntary  Psychosocial Assessment  Eye Contact Fair  Facial Expression Anxious  Affect Sad  Speech Logical/coherent  Interaction Assertive  Motor Activity Slow  Appearance/Hygiene Disheveled  Behavior Characteristics Cooperative  Mood Anxious  Thought Process  Coherency WDL  Content WDL  Delusions None reported or observed  Perception Hallucinations  Hallucination Visual ("Pt reported seeing lines" & "things moving in slow motion")  Judgment Poor  Confusion WDL  Danger to Self  Current suicidal ideation? Denies  Self-Injurious Behavior Some self-injurious ideation observed or expressed.  No lethal plan expressed   Agreement Not to Harm Self Yes  Description of Agreement verbal contract for safety  Danger to Others  Danger to Others None reported or observed

## 2021-10-28 NOTE — [facility] Group Notes (Signed)
Psychoeducational Group Note    Date:10/28/2021 Time: 1300-1400    Purpose of Group: . The group focus' on teaching patients on how to identify their needs and how Life Skills:  A group where two lists are made. What people need and what are things that we do that are healthy. The lists are developed by the patients and it is explained that we often do the actions that are not healthy to get our list of needs met.  to develop the coping skills needed to get their needs met  Participation Level:  Did not attend  Tywone Bembenek A  

## 2021-10-28 NOTE — Progress Notes (Signed)
Adult Psychoeducational Group Note  Date:  10/28/2021 Time:  10:59 PM  Group Topic/Focus:  Wrap-Up Group:   The focus of this group is to help patients review their daily goal of treatment and discuss progress on daily workbooks.  Participation Level:  Active  Participation Quality:  Appropriate  Affect:  Appropriate  Cognitive:  Alert  Insight: Appropriate  Engagement in Group:  Engaged  Modes of Intervention:  Discussion  Additional Comments:  My coping skill is talking to friends.  Bethann Punches 10/28/2021, 10:59 PM

## 2021-10-28 NOTE — Progress Notes (Addendum)
   10/28/21 1700  Psych Admission Type (Psych Patients Only)  Admission Status Voluntary  Psychosocial Assessment  Patient Complaints Anxiety  Eye Contact Fair  Facial Expression Anxious  Affect Sad  Speech Logical/coherent  Interaction Assertive  Motor Activity Slow  Appearance/Hygiene Disheveled  Behavior Characteristics Cooperative  Mood Anxious;Pleasant  Aggressive Behavior  Effect No apparent injury  Thought Process  Coherency WDL  Content WDL  Delusions None reported or observed  Perception Hallucinations  Hallucination Visual ("Pt reported seeing lines" & "things moving in slow motion")  Judgment Poor  Confusion WDL  Danger to Self  Current suicidal ideation? Denies  Self-Injurious Behavior Some self-injurious ideation observed or expressed.  No lethal plan expressed   Agreement Not to Harm Self Yes  Description of Agreement verbal contract for safety  Danger to Others  Danger to Others None reported or observed    D. Pt presents with an anxious affect/ mood- is pleasant during interactions. Pt has been visible in the milieu interacting well with peers and staff, and observed attending groups. Per pt's self inventory, pt rated his depression, hopelessness and anxiety a 6/8/8/, respectively.  Pt currently denies SI/HI A. Labs and vitals monitored. Pt given and educated on medications. Pt supported emotionally and encouraged to express concerns and ask questions.   R. Pt remains safe with 15 minute checks. Will continue POC.

## 2021-10-29 DIAGNOSIS — F332 Major depressive disorder, recurrent severe without psychotic features: Secondary | ICD-10-CM

## 2021-10-29 LAB — CARBAMAZEPINE LEVEL, TOTAL: Carbamazepine Lvl: 6.6 ug/mL (ref 4.0–12.0)

## 2021-10-29 MED ORDER — CARBAMAZEPINE 100 MG PO CHEW
200.0000 mg | CHEWABLE_TABLET | Freq: Three times a day (TID) | ORAL | Status: DC
  Administered 2021-10-29 – 2021-10-31 (×6): 200 mg via ORAL
  Filled 2021-10-29 (×8): qty 2

## 2021-10-29 NOTE — Progress Notes (Signed)
   10/29/21 2300  Psych Admission Type (Psych Patients Only)  Admission Status Voluntary  Psychosocial Assessment  Patient Complaints Anxiety  Eye Contact Fair  Facial Expression Anxious  Affect Sad  Speech Logical/coherent  Interaction Assertive  Motor Activity Slow  Appearance/Hygiene Disheveled  Behavior Characteristics Cooperative  Mood Pleasant  Aggressive Behavior  Effect No apparent injury  Thought Process  Coherency WDL  Content WDL  Delusions None reported or observed  Perception Hallucinations  Hallucination None reported or observed  Judgment Poor  Confusion WDL  Danger to Self  Current suicidal ideation? Denies  Self-Injurious Behavior Some self-injurious ideation observed or expressed.  No lethal plan expressed   Agreement Not to Harm Self Yes  Description of Agreement verbal contract for safety  Danger to Others  Danger to Others None reported or observed

## 2021-10-29 NOTE — [facility] Group Notes (Signed)
Adult Psychoeducational Group Not Date:  10/29/2021 Time:  0900-1045 Group Topic/Focus: PROGRESSIVE RELAXATION. A group where deep breathing is taught and tensing and relaxation muscle groups is used. Imagery is used as well.  Pts are asked to imagine 3 pillars that hold them up when they are not able to hold themselves up.  Participation Level:  ws invited to the group, but did not attend John Vaughan

## 2021-10-29 NOTE — Group Note (Signed)
LCSW Group Therapy Note   Group Date: 10/29/2021 Start Time: 1000 End Time: 1030   Type of Therapy and Topic:  Group Therapy: Boundaries  Participation Level:  Active  Description of Group: This group will address the use of boundaries in their personal lives. Patients will explore why boundaries are important, the difference between healthy and unhealthy boundaries, and negative and postive outcomes of different boundaries and will look at how boundaries can be crossed.  Patients will be encouraged to identify current boundaries in their own lives and identify what kind of boundary is being set. Facilitators will guide patients in utilizing problem-solving interventions to address and correct types boundaries being used and to address when no boundary is being used. Understanding and applying boundaries will be explored and addressed for obtaining and maintaining a balanced life. Patients will be encouraged to explore ways to assertively make their boundaries and needs known to significant others in their lives, using other group members and facilitator for role play, support, and feedback.  Therapeutic Goals:  1.  Patient will identify areas in their life where setting clear boundaries could be  used to improve their life.  2.  Patient will identify signs/triggers that a boundary is not being respected. 3.  Patient will identify two ways to set boundaries in order to achieve balance in  their lives: 4.  Patient will demonstrate ability to communicate their needs and set boundaries  through discussion and/or role plays  Summary of Patient Progress: Due to the acuity and low staffing, group was not held on the adult unit today. Patients were provided therapeutic worksheets and asked to meet with CSW as needed.  Therapeutic Modalities:   Cognitive Behavioral Therapy Solution-Focused Therapy  Jonelle Bann M Laiya Wisby, LCSWA 10/29/2021  10:25 AM    

## 2021-10-29 NOTE — Progress Notes (Signed)
   10/29/21 1200  Psych Admission Type (Psych Patients Only)  Admission Status Voluntary  Psychosocial Assessment  Patient Complaints Anxiety  Eye Contact Fair  Facial Expression Anxious  Affect Sad  Speech Logical/coherent  Interaction Assertive  Motor Activity Slow  Appearance/Hygiene Disheveled  Behavior Characteristics Cooperative  Mood Anxious;Pleasant  Aggressive Behavior  Effect No apparent injury  Thought Process  Coherency WDL  Content WDL  Delusions None reported or observed  Perception Hallucinations  Hallucination None reported or observed  Judgment Poor  Confusion WDL  Danger to Self  Current suicidal ideation? Denies  Self-Injurious Behavior Some self-injurious ideation observed or expressed.  No lethal plan expressed   Agreement Not to Harm Self Yes  Description of Agreement verbal contract for safety  Danger to Others  Danger to Others None reported or observed

## 2021-10-29 NOTE — Progress Notes (Addendum)
[facility] Medical Student Progress Note  10/29/2021 3:49 PM John Vaughan  MRN:  [id number] Principal Problem: Major depressive disorder, recurrent episode, severe (HCC) Diagnosis: Principal Problem:   Major depressive disorder, recurrent episode, severe (HCC) Active Problems:   GAD (generalized anxiety disorder)   Alcohol use disorder, severe, dependence (HCC)   PTSD (post-traumatic stress disorder)  Subjective:  " I am done drinking alcohol.  I am taking my treatment serious this time" Reason for admission:  John Vaughan is a 30 yo M with PMH of major depressive disorder, generalized anxiety disorder with panic attacks, PTSD, and alcohol use disorder (with a history of alcohol withdrawal seizures), who presents for evaluation for worsening depression and suicidal thoughts.   Today's note: Record reviewed, report received and care reviewed with members of our interdisciplinary team.  Patient discussed his discharge plan as need to go for a rehab treatment.  He is also worried about achieving this plan due to his inability to afford this and his up coming court case.  Patient reported that he has not seen a SW since his arrival to the unit.  He is encourage to start working with SW for discharge plan and pending court case.  He reported good sleep and appetite, attends and participates in group activities.  He is visisble in the unit and associates with his peers.  He is compliant with his medications.  His Carbamazepine level today is 6.6 and dose is changed to 200 mg three times  a day for maximum effect.  He denied SI/HI/AVH and no mention of paranoia.  Total Time spent with patient:  25 minutes  Past Psychiatric History: Previous Psych Diagnoses: MDD, ADHD, Withdrawal seizures from AUD Previous inpatient treatment: [facility] 05/24/2021 and 06/06/21 for SI and worsening alcohol use, 12/10/20 [facility] 12/10/21 SI, [facility] 12/03/20 alcohol induced depression, [facility] 07/13/20 MDD, seizures Current/Prior  outpatient treatment: gabapentin, carbamazepine, zoloft Psychiatric medication hx: gabapentin, zoloft, carbamazepine, klonopin  Psychiatric medication compliance hx: compliant prior to running out and not being able to afford 2-3 months ago  Past Medical History:  Past Medical History:  Diagnosis Date   Alcohol abuse    Anxiety    Depression    History reviewed. No pertinent surgical history. Family History:  Family History  Problem Relation Age of Onset   Anxiety disorder Mother    Alcohol abuse Father    Anxiety disorder Maternal Aunt    Anxiety disorder Maternal Grandmother    Alcohol abuse Paternal Grandfather    Family Psychiatric  History:  Diagnoses: bipolar disorder, depression, anxiety on maternal and paternal side Rx: not discussed SA/HA: none known  Social History:  Social History   Substance and Sexual Activity  Alcohol Use Yes   Alcohol/week: 2.0 standard drinks   Types: 2 Cans of beer per week   Comment: states that he drinks 2 beers daily     Social History   Substance and Sexual Activity  Drug Use Not Currently   Types: Marijuana, Cocaine   Comment: cocaine in last month    Social History   Socioeconomic History   Marital status: Single    Spouse name: Not on file   Number of children: Not on file   Years of education: Not on file   Highest education level: Not on file  Occupational History   Not on file  Tobacco Use   Smoking status: Every Day    Packs/day: 1.50    Types: Cigarettes   Smokeless tobacco: Former  Types: Snuff  Vaping Use   Vaping Use: Never used  Substance and Sexual Activity   Alcohol use: Yes    Alcohol/week: 2.0 standard drinks    Types: 2 Cans of beer per week    Comment: states that he drinks 2 beers daily   Drug use: Not Currently    Types: Marijuana, Cocaine    Comment: cocaine in last month   Sexual activity: Not Currently  Other Topics Concern   Not on file  Social History Narrative   Not on file    Social Determinants of Health   Financial Resource Strain: Not on file  Food Insecurity: Not on file  Transportation Needs: Not on file  Physical Activity: Not on file  Stress: Not on file  Social Connections: Not on file    Sleep: Good  Appetite:  Good  Current Medications: Current Facility-Administered Medications  Medication Dose Route Frequency Provider Last Rate Last Admin   acetaminophen (TYLENOL) tablet 650 mg  650 mg Oral Q6H PRN Carlyn Reichert, MD   650 mg at 10/29/21 0826   alum & mag hydroxide-simeth (MAALOX/MYLANTA) 200-200-20 MG/5ML suspension 30 mL  30 mL Oral Q4H PRN Novella Olive, NP       carbamazepine (TEGRETOL) chewable tablet 100 mg  100 mg Oral TID Novella Olive, NP   100 mg at 10/29/21 1300   dicyclomine (BENTYL) capsule 10 mg  10 mg Oral TID AC & HS Novella Olive, NP   10 mg at 10/29/21 1304   DULoxetine (CYMBALTA) DR capsule 40 mg  40 mg Oral Daily Carlyn Reichert, MD   40 mg at 10/29/21 0820   gabapentin (NEURONTIN) capsule 600 mg  600 mg Oral TID Carlyn Reichert, MD   600 mg at 10/29/21 1300   hydrOXYzine (ATARAX/VISTARIL) tablet 25 mg  25 mg Oral TID PRN Bobbitt, Shalon E, NP   25 mg at 10/29/21 1512   ibuprofen (ADVIL) tablet 100 mg  100 mg Oral Q6H PRN Carlyn Reichert, MD   100 mg at 10/29/21 1307   lidocaine (LIDODERM) 5 % 2 patch  2 patch Transdermal Q24H Roselle Locus, MD   2 patch at 10/28/21 2110   magnesium hydroxide (MILK OF MAGNESIA) suspension 30 mL  30 mL Oral Daily PRN Novella Olive, NP   30 mL at 10/28/21 1824   multivitamin with minerals tablet 1 tablet  1 tablet Oral Daily Novella Olive, NP   1 tablet at 10/29/21 0820   nicotine (NICODERM CQ - dosed in mg/24 hours) patch 21 mg  21 mg Transdermal Daily Massengill, Harrold Donath, MD   21 mg at 10/29/21 0812   pantoprazole (PROTONIX) EC tablet 40 mg  40 mg Oral Daily Novella Olive, NP   40 mg at 10/29/21 8338   thiamine tablet 100 mg  100 mg Oral Daily Novella Olive, NP   100 mg at  10/29/21 2505   traZODone (DESYREL) tablet 150 mg  150 mg Oral QHS Massengill, Harrold Donath, MD   150 mg at 10/28/21 2109    Lab Results:  Results for orders placed or performed during the hospital encounter of 10/24/21 (from the past 48 hour(s))  Carbamazepine level, total     Status: None   Collection Time: 10/28/21  7:23 AM  Result Value Ref Range   Carbamazepine Lvl 6.6 4.0 - 12.0 ug/mL    Comment: Performed at [facility], [address], [place], [place] [address]  Carbamazepine level,  total     Status: None   Collection Time: 10/29/21  6:47 AM  Result Value Ref Range   Carbamazepine Lvl 6.6 4.0 - 12.0 ug/mL    Comment: Performed at [facility], [address] [place], [place] [address]    Blood Alcohol level:  Lab Results  Component Value Date   ETH 288 (H) 10/24/2021   ETH 261 (H) 06/05/2021    Metabolic Disorder Labs: Lab Results  Component Value Date   HGBA1C 5.2 10/25/2021   MPG 102.54 10/25/2021   No results found for: PROLACTIN Lab Results  Component Value Date   CHOL 208 (H) 10/25/2021   TRIG 218 (H) 10/25/2021   HDL 71 10/25/2021   CHOLHDL 2.9 10/25/2021   VLDL 44 (H) 10/25/2021   LDLCALC 93 10/25/2021    Physical Findings: CIWA:  CIWA-Ar Total: 0 COWS:     Musculoskeletal: Strength & Muscle Tone: within normal limits Gait & Station: normal Patient leans: N/A  Psychiatric Specialty Exam:  Presentation  General Appearance: Disheveled  Eye Contact:Fair  Speech:Clear and Coherent; Normal Rate  Speech Volume:Normal  Handedness:Right   Mood and Affect  Mood: "good" Affect: bright, cheerful  Thought Process  Thought Processes:Coherent; Goal Directed  Descriptions of Associations:Intact  Orientation:Full (Time, Place and Person)  Thought Content:Logical No suicidal thoughts  History of Schizophrenia/Schizoaffective disorder:No  Duration of Psychotic Symptoms:No data recorded Hallucinations:Hallucinations: None  Ideas  of Reference:None  Suicidal Thoughts: none reported  Homicidal Thoughts: none reported   Sensorium  Memory:Immediate Good; Recent Good  Judgment:Fair  Insight:Good   Executive Functions  Concentration:Fair  Attention Span:Fair  Recall:Fair  [employer] of Knowledge:Fair  Language:Good   Psychomotor Activity  Psychomotor Activity: nml   Assets  Assets:Communication Skills; Desire for Improvement; Resilience   Sleep  Sleep: Good    Physical Exam: Physical Exam HENT:     Head: Normocephalic and atraumatic.  Pulmonary:     Effort: Pulmonary effort is normal.  Neurological:     Mental Status: He is alert.  Psychiatric:        Thought Content: Thought content normal.   Review of Systems  Musculoskeletal:        Rib pain from fall  Psychiatric/Behavioral:  Positive for depression. Negative for hallucinations and suicidal ideas. The patient is nervous/anxious and has insomnia.   Blood pressure 110/69, pulse 75, temperature (!) 97.4 F (36.3 C), temperature source Oral, resp. rate 18, height 6' (1.829 m), weight 78.5 kg, SpO2 100 %. Body mass index is 23.46 kg/m.   Treatment Plan Summary: Daily contact with patient to assess and evaluate symptoms and progress in treatment and Medication management   Diagnoses / Active Problems: Major depressive disorder, severe, recurrent, without psychotic features Generalized anxiety disorder panic attacks PTSD Alcohol use disorder, severe Reported history of ADHD  PLAN: Safety and Monitoring:             -- Voluntary admission to inpatient psychiatric unit for safety, stabilization and treatment             -- Daily contact with patient to assess and evaluate symptoms and progress in treatment             -- Patient's case to be discussed in multi-disciplinary team meeting             -- Observation Level : q15 minute checks             -- Vital signs:  q12 hours             --  Precautions: suicide, elopement, and  assault   2. Psychiatric Diagnoses and Treatment:  - Continue Cymbalta 40 mg once daily  for mood, anxiety, PTSD, reported neuropathic pain - Increase carbamazepine  to 200 mg 3 times daily for mood stabilization and seizure prophylaxis.  level 10/29/2021-6.6 - Continue gabapentin 600 mg 3 times daily for anxiety and neuropathy - Continue trazodone 150 mg as needed nightly for sleep - Continue CIWA with scheduled Ativan and also as needed Ativan for CIWA score greater than 8 - Continue other medications as ordered  3. Medical Issues Being Addressed:              Tobacco Use Disorder             -- Nicotine patch 21mg /24 hours              -- Smoking cessation encouraged   06-11-1982, NP-PMHNP-BC 10/29/2021, 3:49 PM

## 2021-10-29 NOTE — Progress Notes (Signed)
   10/29/21 0000  Psych Admission Type (Psych Patients Only)  Admission Status Voluntary  Psychosocial Assessment  Patient Complaints Anxiety  Eye Contact Fair  Facial Expression Anxious  Affect Sad  Speech Logical/coherent  Interaction Assertive  Motor Activity Slow  Appearance/Hygiene Disheveled  Behavior Characteristics Cooperative  Mood Anxious  Aggressive Behavior  Effect No apparent injury  Thought Process  Coherency WDL  Content WDL  Delusions None reported or observed  Perception Hallucinations  Hallucination Visual ("Pt reported seeing lines" & "things moving in slow motion")  Judgment Poor  Confusion WDL  Danger to Self  Current suicidal ideation? Denies  Self-Injurious Behavior Some self-injurious ideation observed or expressed.  No lethal plan expressed   Agreement Not to Harm Self Yes  Description of Agreement verbal contract for safety  Danger to Others  Danger to Others None reported or observed

## 2021-10-30 ENCOUNTER — Encounter (HOSPITAL_COMMUNITY): Payer: Self-pay

## 2021-10-30 DIAGNOSIS — F332 Major depressive disorder, recurrent severe without psychotic features: Secondary | ICD-10-CM | POA: Diagnosis not present

## 2021-10-30 MED ORDER — HYDROXYZINE HCL 50 MG PO TABS
50.0000 mg | ORAL_TABLET | Freq: Three times a day (TID) | ORAL | Status: DC | PRN
  Administered 2021-10-30 – 2021-11-02 (×8): 50 mg via ORAL
  Filled 2021-10-30: qty 1
  Filled 2021-10-30: qty 10
  Filled 2021-10-30 (×8): qty 1

## 2021-10-30 MED ORDER — DULOXETINE HCL 30 MG PO CPEP
50.0000 mg | ORAL_CAPSULE | Freq: Every day | ORAL | Status: DC
  Administered 2021-10-31: 50 mg via ORAL
  Filled 2021-10-30 (×2): qty 1

## 2021-10-30 NOTE — [facility] Suicide Risk Assessment (Signed)
[facility] INPATIENT:  Family/Significant Other Suicide Prevention Education  Suicide Prevention Education:  Education Completed; Jovonte Commins [phone] (Mother) has been identified by the patient as the family member/significant other with whom the patient will be residing, and identified as the person(s) who will aid the patient in the event of a mental health crisis (suicidal ideations/suicide attempt).  With written consent from the patient, the family member/significant other has been provided the following suicide prevention education, prior to the and/or following the discharge of the patient.  The suicide prevention education provided includes the following: Suicide risk factors Suicide prevention and interventions National Suicide Hotline telephone number [facility] assessment telephone number [facility] Emergency Assistance 911 [facility] and/or Residential Mobile Crisis Unit telephone number  Request made of family/significant other to: Remove weapons (e.g., guns, rifles, knives), all items previously/currently identified as safety concern.   Remove drugs/medications (over-the-counter, prescriptions, illicit drugs), all items previously/currently identified as a safety concern.  The family member/significant other verbalizes understanding of the suicide prevention education information provided.  The family member/significant other agrees to remove the items of safety concern listed above.  CSW spoke with Ms. Olmeda who states that she would like her son to go to inpatient treatment for substance use.  She states that her son has no been taking his medications and was drinking heavily.  She states that she has a 50B and has also pressed assault charges against her son for assaulting her recently.  She states that her son cannot come back to her home.  She states that she has a friend that has agreed to take her son to the [facility] after court on  Wednesday to get some substance use treatment and shelter.  She states that she will call the CSW back on 10/31/2021 with the phone number to provide to her son.  Ms. Mckillop states that she would like her son to be discharged from the hospital by Wednesday so he can attend court.  CSW stated that she would inform the doctor of his upcoming court date.  CSW completed SPE with Ms. Okane.  [employer] Gerado Nabers 10/30/2021, 4:00 PM

## 2021-10-30 NOTE — [facility] Counselor (Addendum)
CSW provided the Pt with a packet that contains information about housing and shelters, free/reduced price food listings, [facility] information, PCP information, and suicide prevention information.  The Pt was living with his mother prior to admission but due to his mother currently having a 50B protective order against him the Pt will be unable to return to the home. CSW explained to the Pt that he will need to contact the shelters and other housing resources to locate shelter prior to discharge or he can attend the [facility] for further assistance with housing and case management after discharge.

## 2021-10-30 NOTE — Progress Notes (Addendum)
[facility] Medical Student Progress Note  10/30/2021 1:00 PM Kendall Justo  MRN:  [id number] Principal Problem: Major depressive disorder, recurrent episode, severe (HCC) Diagnosis: Principal Problem:   Major depressive disorder, recurrent episode, severe (HCC) Active Problems:   GAD (generalized anxiety disorder)   Alcohol use disorder, severe, dependence (HCC)   PTSD (post-traumatic stress disorder)  Subjective: "I am angry because I am stuttering"  Reason for admission:  Quitman Norberto is a 30 yo M with PMH of major depressive disorder, generalized anxiety disorder with panic attacks, PTSD, and alcohol use disorder (with a history of alcohol withdrawal seizures), who presents for evaluation for worsening depression and suicidal thoughts.   Today's note: Record reviewed, report received and care reviewed with members of our interdisciplinary team.   He reports that his mood is "not good." He is upset that he has started to stutter and have brain fog. He is still depressed. He is overwhelmed, and is visibly shaking and appears in distress. He reports having multiple panic attacks a day that feel like he's having a heart attack, but that the prn vistaril helps calm him down. He endorses cravings for alcohol but reports that his motivation to quit is 10/10.  He reported good sleep and appetite.  He attends and participates in group activities.  He is visisble in the unit and associates with his peers. He is compliant with his medications.  He denied SI/HI/AVH and no mention of paranoia.   Past Psychiatric History: Previous Psych Diagnoses: MDD, ADHD, Withdrawal seizures from AUD Previous inpatient treatment: [facility] 05/24/2021 and 06/06/21 for SI and worsening alcohol use, 12/10/20 [facility] 12/10/21 SI, [facility] 12/03/20 alcohol induced depression, [facility] 07/13/20 MDD, seizures Current/Prior outpatient treatment: gabapentin, carbamazepine, zoloft Psychiatric medication hx: gabapentin, zoloft, carbamazepine,  klonopin  Psychiatric medication compliance hx: compliant prior to running out and not being able to afford 2-3 months ago  Past Medical History:  Past Medical History:  Diagnosis Date   Alcohol abuse    Anxiety    Depression    History reviewed. No pertinent surgical history. Family History:  Family History  Problem Relation Age of Onset   Anxiety disorder Mother    Alcohol abuse Father    Anxiety disorder Maternal Aunt    Anxiety disorder Maternal Grandmother    Alcohol abuse Paternal Grandfather    Family Psychiatric  History:  Diagnoses: bipolar disorder, depression, anxiety on maternal and paternal side Rx: not discussed SA/HA: none known  Social History:  Social History   Substance and Sexual Activity  Alcohol Use Yes   Alcohol/week: 2.0 standard drinks   Types: 2 Cans of beer per week   Comment: states that he drinks 2 beers daily     Social History   Substance and Sexual Activity  Drug Use Not Currently   Types: Marijuana, Cocaine   Comment: cocaine in last month    Social History   Socioeconomic History   Marital status: Single    Spouse name: Not on file   Number of children: Not on file   Years of education: Not on file   Highest education level: Not on file  Occupational History   Not on file  Tobacco Use   Smoking status: Every Day    Packs/day: 1.50    Types: Cigarettes   Smokeless tobacco: Former    Types: Snuff  Vaping Use   Vaping Use: Never used  Substance and Sexual Activity   Alcohol use: Yes    Alcohol/week: 2.0 standard  drinks    Types: 2 Cans of beer per week    Comment: states that he drinks 2 beers daily   Drug use: Not Currently    Types: Marijuana, Cocaine    Comment: cocaine in last month   Sexual activity: Not Currently  Other Topics Concern   Not on file  Social History Narrative   Not on file   Social Determinants of Health   Financial Resource Strain: Not on file  Food Insecurity: Not on file  Transportation  Needs: Not on file  Physical Activity: Not on file  Stress: Not on file  Social Connections: Not on file    Sleep: Good  Appetite:  Good  Current Medications: Current Facility-Administered Medications  Medication Dose Route Frequency Provider Last Rate Last Admin   acetaminophen (TYLENOL) tablet 650 mg  650 mg Oral Q6H PRN Carlyn Reichert, MD   650 mg at 10/29/21 0826   alum & mag hydroxide-simeth (MAALOX/MYLANTA) 200-200-20 MG/5ML suspension 30 mL  30 mL Oral Q4H PRN Novella Olive, NP       carbamazepine (TEGRETOL) chewable tablet 200 mg  200 mg Oral TID Dahlia Byes C, NP   200 mg at 10/30/21 1203   dicyclomine (BENTYL) capsule 10 mg  10 mg Oral TID AC & HS Novella Olive, NP   10 mg at 10/30/21 1203   DULoxetine (CYMBALTA) DR capsule 40 mg  40 mg Oral Daily Carlyn Reichert, MD   40 mg at 10/30/21 0818   gabapentin (NEURONTIN) capsule 600 mg  600 mg Oral TID Carlyn Reichert, MD   600 mg at 10/30/21 1203   hydrOXYzine (ATARAX/VISTARIL) tablet 50 mg  50 mg Oral TID PRN Roselle Locus, MD   50 mg at 10/30/21 5364   ibuprofen (ADVIL) tablet 100 mg  100 mg Oral Q6H PRN Carlyn Reichert, MD   100 mg at 10/29/21 2145   lidocaine (LIDODERM) 5 % 2 patch  2 patch Transdermal Q24H Roselle Locus, MD   2 patch at 10/29/21 2149   magnesium hydroxide (MILK OF MAGNESIA) suspension 30 mL  30 mL Oral Daily PRN Novella Olive, NP   30 mL at 10/28/21 1824   multivitamin with minerals tablet 1 tablet  1 tablet Oral Daily Novella Olive, NP   1 tablet at 10/30/21 0818   nicotine (NICODERM CQ - dosed in mg/24 hours) patch 21 mg  21 mg Transdermal Daily Franck Vinal, Harrold Donath, MD   21 mg at 10/30/21 0817   pantoprazole (PROTONIX) EC tablet 40 mg  40 mg Oral Daily Novella Olive, NP   40 mg at 10/30/21 6803   thiamine tablet 100 mg  100 mg Oral Daily Novella Olive, NP   100 mg at 10/30/21 0818   traZODone (DESYREL) tablet 150 mg  150 mg Oral QHS Niall Illes, Harrold Donath, MD   150 mg at 10/29/21 2146     Lab Results:  Results for orders placed or performed during the hospital encounter of 10/24/21 (from the past 48 hour(s))  Carbamazepine level, total     Status: None   Collection Time: 10/29/21  6:47 AM  Result Value Ref Range   Carbamazepine Lvl 6.6 4.0 - 12.0 ug/mL    Comment: Performed at [facility], [address], [place], [place] [address]    Blood Alcohol level:  Lab Results  Component Value Date   ETH 288 (H) 10/24/2021   ETH 261 (H) 06/05/2021    Metabolic Disorder  Labs: Lab Results  Component Value Date   HGBA1C 5.2 10/25/2021   MPG 102.54 10/25/2021   No results found for: PROLACTIN Lab Results  Component Value Date   CHOL 208 (H) 10/25/2021   TRIG 218 (H) 10/25/2021   HDL 71 10/25/2021   CHOLHDL 2.9 10/25/2021   VLDL 44 (H) 10/25/2021   LDLCALC 93 10/25/2021    Physical Findings: CIWA:  CIWA-Ar Total: 0 COWS:     Musculoskeletal: Strength & Muscle Tone: within normal limits Gait & Station: normal Patient leans: N/A  Psychiatric Specialty Exam:  Presentation  General Appearance: Casual; Disheveled  Eye Contact:Good  Speech:Clear and Coherent; Normal Rate  Speech Volume:Normal  Handedness:Right   Mood and Affect  Mood: depressed, anxious Affect: congruent, full range  Thought Process  Thought Processes:Coherent; Goal Directed; Linear  Descriptions of Associations:Intact  Orientation:Full (Time, Place and Person)  Thought Content:Logical No suicidal thoughts  History of Schizophrenia/Schizoaffective disorder:No  Duration of Psychotic Symptoms:No data recorded Hallucinations:Hallucinations: None  Ideas of Reference:None  Suicidal Thoughts: denied  Homicidal Thoughts: denied    Sensorium  Memory:Immediate Good; Recent Good  Judgment:Fair  Insight:Fair   Executive Functions  Concentration:Good  Attention Span:Good  Recall:Good  Fund of Knowledge:Good  Language:Good   Psychomotor Activity   Psychomotor Activity: restlessness   Assets  Assets:Communication Skills; Desire for Improvement; Resilience; Physical Health   Sleep  Sleep: Good    Physical Exam: Physical Exam HENT:     Head: Normocephalic and atraumatic.  Pulmonary:     Effort: Pulmonary effort is normal.  Neurological:     Mental Status: He is alert.  Psychiatric:        Thought Content: Thought content normal.   Review of Systems  Musculoskeletal:        Rib pain from fall  Psychiatric/Behavioral:  Positive for depression. Negative for hallucinations and suicidal ideas. The patient is nervous/anxious. The patient does not have insomnia.   Blood pressure 91/72, pulse 75, temperature (!) 97.3 F (36.3 C), temperature source Oral, resp. rate 18, height 6' (1.829 m), weight 78.5 kg, SpO2 99 %. Body mass index is 23.46 kg/m.   Treatment Plan Summary: Daily contact with patient to assess and evaluate symptoms and progress in treatment and Medication management   Diagnoses / Active Problems: Major depressive disorder, severe, recurrent, without psychotic features Generalized anxiety disorder panic attacks PTSD Alcohol use disorder, severe Reported history of ADHD  PLAN: Safety and Monitoring:             -- Voluntary admission to inpatient psychiatric unit for safety, stabilization and treatment             -- Daily contact with patient to assess and evaluate symptoms and progress in treatment             -- Patient's case to be discussed in multi-disciplinary team meeting             -- Observation Level : q15 minute checks             -- Vital signs:  q12 hours             -- Precautions: suicide, elopement, and assault   2. Psychiatric Diagnoses and Treatment:  - INCREASE Cymbalta to 50 mg once daily for mood, anxiety, PTSD, reported neuropathic pain - Continue carbamazepine  to 200 mg 3 times daily for mood stabilization and seizure prophylaxis.  level 10/29/2021-6.6 - Continue gabapentin 600  mg 3 times daily for anxiety  and neuropathy - Continue trazodone 150 mg as needed nightly for sleep - INCREASE Hydroxyzine to 50 mg PRN  - Continue CIWA with scheduled Ativan and also as needed Ativan for CIWA score greater than 8 - Continue other medications as ordered  3. Medical Issues Being Addressed:              Tobacco Use Disorder             -- Nicotine patch 21mg /24 hours              -- Smoking cessation encouraged   Sharene Skeans, Medical Student  10/30/2021, 1:00 PM   Total Time Spent in Direct Patient Care:  I personally spent 30 minutes on the unit in direct patient care. The direct patient care time included face-to-face time with the patient, reviewing the patient's chart, communicating with other professionals, and coordinating care. Greater than 50% of this time was spent in counseling or coordinating care with the patient regarding goals of hospitalization, psycho-education, and discharge planning needs.  I have independently evaluated the patient during a face-to-face assessment on 10/30/21. I reviewed the patient's chart, and I participated in key portions of the service. I discussed the case with the [employer], and I agree with the assessment and plan of care as documented in the House Officer's note, as addended by me or notated below:  I directly edited the note, as above.    Phineas Inches, MD Psychiatrist

## 2021-10-30 NOTE — Group Note (Signed)
Occupational Therapy Group Note  Group Topic:Self-Esteem  Group Date: 10/30/2021 Start Time: 1400 End Time: 1435 Facilitators: Donne Hazel, OT/L   Group Description: Group encouraged increased engagement and participation through discussion and activity focused on self-esteem. Patients explored and discussed the differences between healthy and low self-esteem and how it affects our daily lives and occupations with a focus on relationships, work, school, self-care, and personal leisure interests. Group discussion then transitioned into identifying specific strategies to boost self-esteem and engaged in a collaborative and independent activity looking at positive ways to describe oneself A-Z.   Therapeutic Goal(s): Understand and recognize the differences between healthy and low self-esteem Identify healthy strategies to improve/build self-esteem    Participation Level: Active   Participation Quality: Independent   Behavior: Cooperative and Interactive    Speech/Thought Process: Focused   Affect/Mood: Euthymic   Insight: Fair   Judgement: Fair   Individualization: John Vaughan was active in their participation of group discussion/activity. Pt identified having "low" self-esteem and appeared receptive in looking at strategies to boost his self-worth. Engaged actively with peers.   Modes of Intervention: Activity, Discussion, and Education  Patient Response to Interventions:  Attentive, Engaged, and Receptive   Plan: Continue to engage patient in OT groups 2 - 3x/week.  10/30/2021  Donne Hazel, OT/L

## 2021-10-30 NOTE — [facility] Group Notes (Signed)
[facility] Group Notes:  (Nursing/MHT/Case Management/Adjunct)  Date:  10/30/2021  Time:  10:42 PM  Type of Therapy:  Psychoeducational Skills  Participation Level:  Active  Participation Quality:  Attentive  Affect:  Appropriate  Cognitive:  Appropriate  Insight:  Appropriate  Engagement in Group:  Improving  Modes of Intervention:  Education  Summary of Progress/Problems: The patient attended the evening A.A.meeting and was appropriate.   Hazle Coca S 10/30/2021, 10:42 PM

## 2021-10-30 NOTE — Progress Notes (Signed)
Pt visible on the unit, pt continues to express side effects from the medication, but when observed without pt knowledge, pt does not appear to have Sx.    10/30/21 2100  Psych Admission Type (Psych Patients Only)  Admission Status Voluntary  Psychosocial Assessment  Patient Complaints Anxiety  Eye Contact Fair  Facial Expression Anxious  Affect Sad  Speech Logical/coherent  Interaction Assertive  Motor Activity Slow  Appearance/Hygiene Disheveled  Behavior Characteristics Cooperative  Mood Pleasant  Aggressive Behavior  Effect No apparent injury  Thought Process  Coherency WDL  Content WDL  Delusions None reported or observed  Perception Hallucinations  Hallucination None reported or observed  Judgment Poor  Confusion WDL  Danger to Self  Current suicidal ideation? Denies  Self-Injurious Behavior Some self-injurious ideation observed or expressed.  No lethal plan expressed   Agreement Not to Harm Self Yes  Description of Agreement verbal contract for safety  Danger to Others  Danger to Others None reported or observed

## 2021-10-30 NOTE — BH IP Treatment Plan (Signed)
Interdisciplinary Treatment and Diagnostic Plan Update  10/30/2021 Time of Session: 10:25am Amdrew Oboyle MRN: [id number]  Principal Diagnosis: Major depressive disorder, recurrent episode, severe (HCC)  Secondary Diagnoses: Principal Problem:   Major depressive disorder, recurrent episode, severe (HCC) Active Problems:   GAD (generalized anxiety disorder)   Alcohol use disorder, severe, dependence (HCC)   PTSD (post-traumatic stress disorder)   Current Medications:  Current Facility-Administered Medications  Medication Dose Route Frequency Provider Last Rate Last Admin   acetaminophen (TYLENOL) tablet 650 mg  650 mg Oral Q6H PRN Carlyn Reichert, MD   650 mg at 10/29/21 0826   alum & mag hydroxide-simeth (MAALOX/MYLANTA) 200-200-20 MG/5ML suspension 30 mL  30 mL Oral Q4H PRN Novella Olive, NP       carbamazepine (TEGRETOL) chewable tablet 200 mg  200 mg Oral TID Dahlia Byes C, NP   200 mg at 10/30/21 0818   dicyclomine (BENTYL) capsule 10 mg  10 mg Oral TID AC & HS Novella Olive, NP   10 mg at 10/30/21 6606   DULoxetine (CYMBALTA) DR capsule 40 mg  40 mg Oral Daily Carlyn Reichert, MD   40 mg at 10/30/21 0818   gabapentin (NEURONTIN) capsule 600 mg  600 mg Oral TID Carlyn Reichert, MD   600 mg at 10/30/21 0818   hydrOXYzine (ATARAX/VISTARIL) tablet 50 mg  50 mg Oral TID PRN Roselle Locus, MD   50 mg at 10/30/21 3016   ibuprofen (ADVIL) tablet 100 mg  100 mg Oral Q6H PRN Carlyn Reichert, MD   100 mg at 10/29/21 2145   lidocaine (LIDODERM) 5 % 2 patch  2 patch Transdermal Q24H Roselle Locus, MD   2 patch at 10/29/21 2149   magnesium hydroxide (MILK OF MAGNESIA) suspension 30 mL  30 mL Oral Daily PRN Novella Olive, NP   30 mL at 10/28/21 1824   multivitamin with minerals tablet 1 tablet  1 tablet Oral Daily Novella Olive, NP   1 tablet at 10/30/21 0818   nicotine (NICODERM CQ - dosed in mg/24 hours) patch 21 mg  21 mg Transdermal Daily Massengill, Harrold Donath,  MD   21 mg at 10/30/21 0817   pantoprazole (PROTONIX) EC tablet 40 mg  40 mg Oral Daily Novella Olive, NP   40 mg at 10/30/21 0109   thiamine tablet 100 mg  100 mg Oral Daily Novella Olive, NP   100 mg at 10/30/21 0818   traZODone (DESYREL) tablet 150 mg  150 mg Oral QHS Massengill, Harrold Donath, MD   150 mg at 10/29/21 2146   PTA Medications: Medications Prior to Admission  Medication Sig Dispense Refill Last Dose   carbamazepine (TEGRETOL) 100 MG chewable tablet Chew 1 tablet (100 mg total) by mouth 3 (three) times daily. (Patient not taking: No sig reported) 60 tablet 0    dicyclomine (BENTYL) 10 MG capsule Take 1 capsule (10 mg total) by mouth 4 (four) times daily -  before meals and at bedtime. (Patient not taking: No sig reported) 40 capsule 0    gabapentin (NEURONTIN) 400 MG capsule Take 1 capsule (400 mg total) by mouth 3 (three) times daily. (Patient not taking: No sig reported) 90 capsule 0    hydrOXYzine (ATARAX/VISTARIL) 50 MG tablet Take 1 tablet (50 mg total) by mouth 3 (three) times daily as needed for anxiety. (Patient not taking: No sig reported) 30 tablet 0    pantoprazole (PROTONIX) 40 MG tablet Take 1 tablet (40 mg total)  by mouth daily. (Patient not taking: No sig reported) 30 tablet 0    sertraline (ZOLOFT) 50 MG tablet Take 2 tablets (100 mg total) by mouth daily. (Patient not taking: No sig reported) 30 tablet 0    traZODone (DESYREL) 100 MG tablet Take 1 tablet (100 mg total) by mouth at bedtime as needed for sleep. (Patient not taking: No sig reported) 30 tablet 0     Patient Stressors: Marital or family conflict   Substance abuse    Patient Strengths: Average or above average intelligence  Capable of independent living  Communication skills  Motivation for treatment/growth   Treatment Modalities: Medication Management, Group therapy, Case management,  1 to 1 session with clinician, Psychoeducation, Recreational therapy.   Physician Treatment Plan for Primary  Diagnosis: Major depressive disorder, recurrent episode, severe (HCC) Long Term Goal(s): Improvement in symptoms so as ready for discharge   Short Term Goals: Ability to identify changes in lifestyle to reduce recurrence of condition will improve Ability to verbalize feelings will improve Ability to disclose and discuss suicidal ideas Ability to demonstrate self-control will improve Ability to identify and develop effective coping behaviors will improve Ability to maintain clinical measurements within normal limits will improve Compliance with prescribed medications will improve Ability to identify triggers associated with substance abuse/mental health issues will improve  Medication Management: Evaluate patient's response, side effects, and tolerance of medication regimen.  Therapeutic Interventions: 1 to 1 sessions, Unit Group sessions and Medication administration.  Evaluation of Outcomes: Progressing  Physician Treatment Plan for Secondary Diagnosis: Principal Problem:   Major depressive disorder, recurrent episode, severe (HCC) Active Problems:   GAD (generalized anxiety disorder)   Alcohol use disorder, severe, dependence (HCC)   PTSD (post-traumatic stress disorder)  Long Term Goal(s): Improvement in symptoms so as ready for discharge   Short Term Goals: Ability to identify changes in lifestyle to reduce recurrence of condition will improve Ability to verbalize feelings will improve Ability to disclose and discuss suicidal ideas Ability to demonstrate self-control will improve Ability to identify and develop effective coping behaviors will improve Ability to maintain clinical measurements within normal limits will improve Compliance with prescribed medications will improve Ability to identify triggers associated with substance abuse/mental health issues will improve     Medication Management: Evaluate patient's response, side effects, and tolerance of medication  regimen.  Therapeutic Interventions: 1 to 1 sessions, Unit Group sessions and Medication administration.  Evaluation of Outcomes: Progressing   RN Treatment Plan for Primary Diagnosis: Major depressive disorder, recurrent episode, severe (HCC) Long Term Goal(s): Knowledge of disease and therapeutic regimen to maintain health will improve  Short Term Goals: Ability to remain free from injury will improve, Ability to verbalize frustration and anger appropriately will improve, Ability to demonstrate self-control, Ability to participate in decision making will improve, and Compliance with prescribed medications will improve  Medication Management: RN will administer medications as ordered by provider, will assess and evaluate patient's response and provide education to patient for prescribed medication. RN will report any adverse and/or side effects to prescribing provider.  Therapeutic Interventions: 1 on 1 counseling sessions, Psychoeducation, Medication administration, Evaluate responses to treatment, Monitor vital signs and CBGs as ordered, Perform/monitor CIWA, COWS, AIMS and Fall Risk screenings as ordered, Perform wound care treatments as ordered.  Evaluation of Outcomes: Progressing   LCSW Treatment Plan for Primary Diagnosis: Major depressive disorder, recurrent episode, severe (HCC) Long Term Goal(s): Safe transition to appropriate next level of care at discharge, Engage patient in therapeutic  group addressing interpersonal concerns.  Short Term Goals: Engage patient in aftercare planning with referrals and resources, Increase social support, Increase ability to appropriately verbalize feelings, Identify triggers associated with mental health/substance abuse issues, and Increase skills for wellness and recovery  Therapeutic Interventions: Assess for all discharge needs, 1 to 1 time with Social worker, Explore available resources and support systems, Assess for adequacy in community  support network, Educate family and significant other(s) on suicide prevention, Complete Psychosocial Assessment, Interpersonal group therapy.  Evaluation of Outcomes: Progressing   Progress in Treatment: Attending groups: Yes. Participating in groups: Yes. Taking medication as prescribed: Yes. Toleration medication: Yes. Family/Significant other contact made: Yes, individual(s) contacted:  mother Patient understands diagnosis: No. Discussing patient identified problems/goals with staff: Yes. Medical problems stabilized or resolved: Yes. Denies suicidal/homicidal ideation: Yes. Issues/concerns per patient self-inventory: No.  New problem(s) identified: No, Describe:  none  New Short Term/Long Term Goal(s): detox, medication management for mood stabilization; elimination of SI thoughts; development of comprehensive mental wellness/sobriety plan  Patient Goals:  "To take care of my anger"  Discharge Plan or Barriers: Pt is to be released to police custody at discharge.   Reason for Continuation of Hospitalization: Medication stabilization  Estimated Length of Stay: 1-3 days   Scribe for Treatment Team: Otelia Santee, LCSW 10/30/2021 11:03 AM

## 2021-10-30 NOTE — [facility] Group Notes (Addendum)
Spiritual care group on grief and loss facilitated by chaplain Dyanne Carrel, [facility]   Group Goal:   Support / Education around grief and loss   Members engage in facilitated group support and psycho-social education.   Group Description:   Following introductions and group rules, group members engaged in facilitated group dialog and support around topic of loss, with particular support around experiences of loss in their lives. Group Identified types of loss (relationships / self / things) and identified patterns, circumstances, and changes that precipitate losses. Reflected on thoughts / feelings around loss, normalized grief responses, and recognized variety in grief experience. Group noted Worden's four tasks of grief in discussion.   Group drew on Adlerian / Rogerian, narrative, MI,   Patient Progress: John Vaughan attended group and was engaged.  He was very focused on supporting a particular fellow patient, but also participated in group conversation. Towards the end of group, he shared about the traumatic death of his father and how he found both of his parents unresponsive and was able to save his mother.  He stated that he has been unable to grieve.  He was invited to meet one-on-one but declined.  Chaplain Dyanne Carrel, Bcc Pager, [phone] 9:54 PM

## 2021-10-30 NOTE — [facility] Group Notes (Signed)
Pt did not attend evening group. 

## 2021-10-30 NOTE — Group Note (Signed)
LCSW Group Therapy Note   Group Date: 10/30/2021 Start Time: 1300 End Time: 1400  LCSW Group Therapy Note  Type of Therapy and Topic:  Group Therapy - Healthy vs Unhealthy Coping Skills  Participation Level:  Active  Description of Group The focus of this group was to determine what unhealthy coping techniques typically are used by group members and what healthy coping techniques would be helpful in coping with various problems. Patients were guided in becoming aware of the differences between healthy and unhealthy coping techniques. Patients were asked to identify 2-3 healthy coping skills they would like to learn to use more effectively.  Therapeutic Goals 1. Patients learned that coping is what human beings do all day long to deal with various situations in their lives 2. Patients defined and discussed healthy vs unhealthy coping techniques 3. Patients identified their preferred coping techniques and identified whether these were healthy or unhealthy 4. Patients determined 2-3 healthy coping skills they would like to become more familiar with and use more often. 5. 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    Therapeutic Modalities Cognitive Behavioral Therapy Motivational Interviewing   Felizardo Hoffmann, [name] 10/30/2021  1:50 PM

## 2021-10-30 NOTE — [facility] Group Notes (Signed)
[facility] Group Notes:  (Nursing/MHT/Case Management/Adjunct)  Date:  10/30/2021  Time:  10:21 AM  Type of Therapy:  Group Therapy  Participation Level:  Active  Participation Quality:  Appropriate  Affect:  Appropriate  Cognitive:  Appropriate  Insight:  Appropriate  Engagement in Group:  Engaged  Modes of Intervention:  Discussion  Summary of Progress/Problems: Patient wants to work on his discharge plans  as his goal today. He stated he wants to establish housing and out patient therapy prior to discharge.  John Vaughan 10/30/2021, 10:21 AM

## 2021-10-30 NOTE — Progress Notes (Signed)
   10/30/21 1100  Psych Admission Type (Psych Patients Only)  Admission Status Voluntary  Psychosocial Assessment  Patient Complaints Anxiety  Eye Contact Fair  Facial Expression Anxious  Affect Sad  Speech Logical/coherent  Interaction Assertive  Motor Activity Slow  Appearance/Hygiene Disheveled  Behavior Characteristics Cooperative  Mood Pleasant  Aggressive Behavior  Effect No apparent injury  Thought Process  Coherency WDL  Content WDL  Delusions None reported or observed  Perception Hallucinations  Hallucination None reported or observed  Judgment Poor  Confusion WDL  Danger to Self  Current suicidal ideation? Denies  Self-Injurious Behavior Some self-injurious ideation observed or expressed.  No lethal plan expressed   Agreement Not to Harm Self Yes  Description of Agreement verbal contract for safety  Danger to Others  Danger to Others None reported or observed

## 2021-10-30 NOTE — Group Note (Signed)
Recreation Therapy Group Note   Group Topic:Stress Management  Group Date: 10/30/2021 Start Time: 0940 End Time: 0952 Facilitators: Caroll Rancher, LRT/CTRS Location: [address]  Goal Area(s) Addresses:  Patient will actively participate in stress management techniques presented during session.  Patient will successfully identify benefit of practicing stress management post d/c.   Group Description: Meditation. LRT played a meditation that focused on being resilient in the face of adversity.  Patients were to listen and follow along as meditation played to engage in group. Patients were given suggestions of ways to access scripts post d/c and encouraged to explore Youtube and other apps available on smartphones, tablets, and computers.   Affect/Mood: Appropriate   Participation Level: Engaged   Participation Quality: Independent   Behavior: Appropriate   Speech/Thought Process: Focused   Insight: Good   Judgement: Good   Modes of Intervention: Meditation   Patient Response to Interventions:  Engaged   Education Outcome:  Acknowledges education and In group clarification offered    Clinical Observations/Individualized Feedback: Pt attended and participated in group.    Plan: Continue to engage patient in RT group sessions 2-3x/week.   Caroll Rancher, LRT/CTRS 10/30/2021 12:39 PM

## 2021-10-31 DIAGNOSIS — F332 Major depressive disorder, recurrent severe without psychotic features: Secondary | ICD-10-CM | POA: Diagnosis not present

## 2021-10-31 MED ORDER — GABAPENTIN 300 MG PO CAPS
300.0000 mg | ORAL_CAPSULE | Freq: Three times a day (TID) | ORAL | Status: DC
  Administered 2021-10-31 – 2021-11-02 (×5): 300 mg via ORAL
  Filled 2021-10-31 (×8): qty 1

## 2021-10-31 MED ORDER — DULOXETINE HCL 20 MG PO CPEP
40.0000 mg | ORAL_CAPSULE | Freq: Every day | ORAL | Status: DC
  Administered 2021-11-01 – 2021-11-02 (×2): 40 mg via ORAL
  Filled 2021-10-31 (×3): qty 2

## 2021-10-31 MED ORDER — CARBAMAZEPINE 100 MG PO CHEW
100.0000 mg | CHEWABLE_TABLET | Freq: Three times a day (TID) | ORAL | Status: DC
  Administered 2021-10-31 – 2021-11-02 (×5): 100 mg via ORAL
  Filled 2021-10-31 (×8): qty 1

## 2021-10-31 NOTE — BHH Counselor (Signed)
CSW faxed a court letter to the Magistrates office showing that the Pt is in the hospital and will not be attending his court date.  CSW has also let the Pt's mother know about the Pt missing the court date as well.

## 2021-10-31 NOTE — [facility] Group Notes (Signed)
[facility] Group Notes:  (Nursing/MHT/Case Management/Adjunct)  Date:  10/31/2021  Time:  10:43 PM  Type of Therapy:  Psychoeducational Skills  Participation Level:  Active  Participation Quality:  Monopolizing  Affect:  Excited  Cognitive:  Disorganized  Insight:  Improving  Engagement in Group:  Developing/Improving  Modes of Intervention:  Education  Summary of Progress/Problems: Patient rated his day as a 5 out of 10 since he is adjusting to some new medication. He went outside which was positive. His goal for tomorrow is to address his medication changes.   Hazle Coca S 10/31/2021, 10:43 PM

## 2021-10-31 NOTE — Progress Notes (Signed)
Progress note    10/31/21 0755  Psych Admission Type (Psych Patients Only)  Admission Status Voluntary  Psychosocial Assessment  Patient Complaints Anxiety  Eye Contact Fair  Facial Expression Animated;Anxious  Affect Anxious;Appropriate to circumstance  Speech Logical/coherent  Interaction Assertive  Motor Activity Slow  Appearance/Hygiene Unremarkable  Behavior Characteristics Cooperative;Appropriate to situation;Anxious  Mood Anxious;Pleasant  Thought Process  Coherency Concrete thinking  Content WDL  Delusions None reported or observed  Perception WDL  Hallucination None reported or observed  Judgment Poor  Confusion None  Danger to Self  Current suicidal ideation? Denies  Danger to Others  Danger to Others None reported or observed

## 2021-10-31 NOTE — Group Note (Signed)
Recreation Therapy Group Note   Group Topic:Animal Assisted Therapy   Group Date: 10/31/2021 Start Time: 1430 End Time: 1500 Facilitators: Caroll Rancher, LRT/CTRS Location: [address]   AAA/T Program Assumption of Risk Form signed by Patient/ or Parent Legal Guardian Yes  Patient is free of allergies or severe asthma Yes  Patient reports no fear of animals Yes  Patient reports no history of cruelty to animals Yes  Patient understands his/her participation is voluntary Yes  Patient washes hands before animal contact Yes  Patient washes hands after animal contact Yes   Affect/Mood: Appropriate   Participation Level: Engaged   Participation Quality: Independent   Behavior: Appropriate   Speech/Thought Process: Focused   Insight: Good   Judgement: Good   Modes of Intervention: Teaching laboratory technician   Patient Response to Interventions:  Engaged   Education Outcome:  Acknowledges education and In group clarification offered    Clinical Observations/Individualized Feedback: Patient attended session and interacted appropriately with therapy dog and peers. Patient asked appropriate questions about therapy dog and his training. Patient shared stories about their pets at home with group.    Plan: Continue to engage patient in RT group sessions 2-3x/week.   Caroll Rancher, LRT/CTRS 10/31/2021 4:11 PM

## 2021-10-31 NOTE — Plan of Care (Signed)
  Problem: Education: Goal: Knowledge of [facility] [facility] Education information/materials will improve Outcome: Progressing Goal: Verbalization of understanding the information provided will improve Outcome: Progressing   Problem: Activity: Goal: Interest or engagement in activities will improve Outcome: Progressing   

## 2021-10-31 NOTE — Progress Notes (Addendum)
[facility] Medical Student Progress Note  10/31/2021 12:33 PM John Vaughan  MRN:  [id number] Principal Problem: Major depressive disorder, recurrent episode, severe (HCC) Diagnosis: Principal Problem:   Major depressive disorder, recurrent episode, severe (HCC) Active Problems:   GAD (generalized anxiety disorder)   Alcohol use disorder, severe, dependence (HCC)   PTSD (post-traumatic stress disorder)  Subjective: John Vaughan is a 30 yo M with PMH of major depressive disorder, generalized anxiety disorder with panic attacks, PTSD, and alcohol use disorder (with a history of alcohol withdrawal seizures), who presents for evaluation for worsening depression and suicidal thoughts.   Today's note: Record reviewed, report received and care reviewed with members of our interdisciplinary team.   He reports that his mood is less depressed and less overwhelmed. He is still frustrated by his stuttering and brain fog. He also reports dizziness.  He reports that he has moments of sadness when he thinks about certain things, but that he tries to just let the thoughts go. He also tries to make other people laugh to cope. He reports that he is still anxious but that the vistaril "works extremely well."  He reported good sleep and appetite.  He attends and participates in group activities.   He reports that the AA meeting was helpful for relating to others. He is visisble in the unit and associates with his peers. He is compliant with his medications.  He denied SI/HI/AVH.   Past Psychiatric History: Previous Psych Diagnoses: MDD, ADHD, Withdrawal seizures from AUD Previous inpatient treatment: [facility] 05/24/2021 and 06/06/21 for SI and worsening alcohol use, 12/10/20 [facility] 12/10/21 SI, [facility] 12/03/20 alcohol induced depression, [facility] 07/13/20 MDD, seizures Current/Prior outpatient treatment: gabapentin, carbamazepine, zoloft Psychiatric medication hx: gabapentin, zoloft, carbamazepine, klonopin  Psychiatric  medication compliance hx: compliant prior to running out and not being able to afford 2-3 months ago  Past Medical History:  Past Medical History:  Diagnosis Date   Alcohol abuse    Anxiety    Depression    History reviewed. No pertinent surgical history. Family History:  Family History  Problem Relation Age of Onset   Anxiety disorder Mother    Alcohol abuse Father    Anxiety disorder Maternal Aunt    Anxiety disorder Maternal Grandmother    Alcohol abuse Paternal Grandfather    Family Psychiatric  History:  Diagnoses: bipolar disorder, depression, anxiety on maternal and paternal side Rx: not discussed SA/HA: none known  Social History:  Social History   Substance and Sexual Activity  Alcohol Use Yes   Alcohol/week: 2.0 standard drinks   Types: 2 Cans of beer per week   Comment: states that he drinks 2 beers daily     Social History   Substance and Sexual Activity  Drug Use Not Currently   Types: Marijuana, Cocaine   Comment: cocaine in last month    Social History   Socioeconomic History   Marital status: Single    Spouse name: Not on file   Number of children: Not on file   Years of education: Not on file   Highest education level: Not on file  Occupational History   Not on file  Tobacco Use   Smoking status: Every Day    Packs/day: 1.50    Types: Cigarettes   Smokeless tobacco: Former    Types: Snuff  Vaping Use   Vaping Use: Never used  Substance and Sexual Activity   Alcohol use: Yes    Alcohol/week: 2.0 standard drinks    Types:  2 Cans of beer per week    Comment: states that he drinks 2 beers daily   Drug use: Not Currently    Types: Marijuana, Cocaine    Comment: cocaine in last month   Sexual activity: Not Currently  Other Topics Concern   Not on file  Social History Narrative   Not on file   Social Determinants of Health   Financial Resource Strain: Not on file  Food Insecurity: Not on file  Transportation Needs: Not on file   Physical Activity: Not on file  Stress: Not on file  Social Connections: Not on file    Sleep: Good  Appetite:  Good  Current Medications: Current Facility-Administered Medications  Medication Dose Route Frequency Provider Last Rate Last Admin   acetaminophen (TYLENOL) tablet 650 mg  650 mg Oral Q6H PRN Carlyn Reichert, MD   650 mg at 10/29/21 0826   alum & mag hydroxide-simeth (MAALOX/MYLANTA) 200-200-20 MG/5ML suspension 30 mL  30 mL Oral Q4H PRN Novella Olive, NP       carbamazepine (TEGRETOL) chewable tablet 200 mg  200 mg Oral TID Dahlia Byes C, NP   200 mg at 10/31/21 1130   dicyclomine (BENTYL) capsule 10 mg  10 mg Oral TID AC & HS Novella Olive, NP   10 mg at 10/31/21 1130   DULoxetine (CYMBALTA) DR capsule 50 mg  50 mg Oral Daily Carlyn Reichert, MD   50 mg at 10/31/21 0756   gabapentin (NEURONTIN) capsule 600 mg  600 mg Oral TID Carlyn Reichert, MD   600 mg at 10/31/21 1131   hydrOXYzine (ATARAX/VISTARIL) tablet 50 mg  50 mg Oral TID PRN Roselle Locus, MD   50 mg at 10/31/21 0759   ibuprofen (ADVIL) tablet 100 mg  100 mg Oral Q6H PRN Carlyn Reichert, MD   100 mg at 10/30/21 2119   lidocaine (LIDODERM) 5 % 2 patch  2 patch Transdermal Q24H Roselle Locus, MD   2 patch at 10/30/21 2119   magnesium hydroxide (MILK OF MAGNESIA) suspension 30 mL  30 mL Oral Daily PRN Novella Olive, NP   30 mL at 10/28/21 1824   multivitamin with minerals tablet 1 tablet  1 tablet Oral Daily Novella Olive, NP   1 tablet at 10/31/21 0756   nicotine (NICODERM CQ - dosed in mg/24 hours) patch 21 mg  21 mg Transdermal Daily Ireanna Finlayson, Harrold Donath, MD   21 mg at 10/31/21 0754   pantoprazole (PROTONIX) EC tablet 40 mg  40 mg Oral Daily Novella Olive, NP   40 mg at 10/31/21 0932   thiamine tablet 100 mg  100 mg Oral Daily Novella Olive, NP   100 mg at 10/31/21 0755   traZODone (DESYREL) tablet 150 mg  150 mg Oral QHS Phineas Inches, MD   150 mg at 10/30/21 2119    Lab Results:   No results found for this or any previous visit (from the past 48 hour(s)).   Blood Alcohol level:  Lab Results  Component Value Date   ETH 288 (H) 10/24/2021   ETH 261 (H) 06/05/2021    Metabolic Disorder Labs: Lab Results  Component Value Date   HGBA1C 5.2 10/25/2021   MPG 102.54 10/25/2021   No results found for: PROLACTIN Lab Results  Component Value Date   CHOL 208 (H) 10/25/2021   TRIG 218 (H) 10/25/2021   HDL 71 10/25/2021   CHOLHDL 2.9 10/25/2021   VLDL 44 (H) 10/25/2021  LDLCALC 93 10/25/2021    Physical Findings: CIWA:  CIWA-Ar Total: 0 COWS:     Musculoskeletal: Strength & Muscle Tone: within normal limits Gait & Station: normal Patient leans: N/A  Psychiatric Specialty Exam:  Presentation  General Appearance: Disheveled  Eye Contact:Fair  Speech:Clear and Coherent; Normal Rate  Speech Volume:Normal  Handedness:Right   Mood and Affect  Mood: depressed, anxious Affect: congruent, full range  Thought Process  Thought Processes:Coherent; Goal Directed; Linear  Descriptions of Associations:Intact  Orientation:Full (Time, Place and Person)  Thought Content:Logical No suicidal thoughts  History of Schizophrenia/Schizoaffective disorder:No  Duration of Psychotic Symptoms:No data recorded Hallucinations:Hallucinations: None  Ideas of Reference:None  Suicidal Thoughts: denied  Homicidal Thoughts: denied    Sensorium  Memory:Immediate Good; Recent Good  Judgment:Fair  Insight:Fair   Executive Functions  Concentration:Fair  Attention Span:Fair  Recall:Fair  Fund of Knowledge:Fair  Language:Good   Psychomotor Activity  Psychomotor Activity: restlessness   Assets  Assets:Communication Skills; Desire for Improvement; Physical Health; Resilience   Sleep  Sleep: Good    Physical Exam: Physical Exam HENT:     Head: Normocephalic and atraumatic.  Pulmonary:     Effort: Pulmonary effort is normal.   Neurological:     Mental Status: He is alert.  Psychiatric:        Thought Content: Thought content normal.   Review of Systems  Musculoskeletal:        Rib pain from fall  Psychiatric/Behavioral:  Positive for depression. Negative for hallucinations and suicidal ideas. The patient is nervous/anxious. The patient does not have insomnia.    Blood pressure 111/79, pulse 70, temperature 97.7 F (36.5 C), temperature source Oral, resp. rate 18, height 6' (1.829 m), weight 78.5 kg, SpO2 100 %. Body mass index is 23.46 kg/m.   Treatment Plan Summary: Daily contact with patient to assess and evaluate symptoms and progress in treatment and Medication management   Diagnoses / Active Problems: Major depressive disorder, severe, recurrent, without psychotic features Generalized anxiety disorder panic attacks PTSD Alcohol use disorder, severe Reported history of ADHD  PLAN: Safety and Monitoring:             -- Voluntary admission to inpatient psychiatric unit for safety, stabilization and treatment             -- Daily contact with patient to assess and evaluate symptoms and progress in treatment             -- Patient's case to be discussed in multi-disciplinary team meeting             -- Observation Level : q15 minute checks             -- Vital signs:  q12 hours             -- Precautions: suicide, elopement, and assault   2. Psychiatric Diagnoses and Treatment:  - DECREASE Cymbalta to 40 mg once daily for mood, anxiety, PTSD, reported neuropathic pain - DECREASE carbamazepine  to 100 mg 3 times daily for mood stabilization and seizure prophylaxis.  level 10/29/2021-6.6.  Decreased dose due to dizziness and will order BMP and current means of pain level for tomorrow morning. - DECREASE gabapentin to 300 mg 3 times daily for anxiety and neuropathy.  Decrease dose due to dizziness. - Continue trazodone 150 mg as needed nightly for sleep - continue Hydroxyzine 50 mg PRN  - Continue  CIWA with scheduled Ativan and also as needed Ativan for CIWA score greater than  8 - Continue other medications as ordered  3. Medical Issues Being Addressed:              Tobacco Use Disorder             -- Nicotine patch 21mg /24 hours              -- Smoking cessation encouraged   06-11-1982, Medical Student  10/31/2021, 12:33 PM  Total Time Spent in Direct Patient Care:  I personally spent 30 minutes on the unit in direct patient care. The direct patient care time included face-to-face time with the patient, reviewing the patient's chart, communicating with other professionals, and coordinating care. Greater than 50% of this time was spent in counseling or coordinating care with the patient regarding goals of hospitalization, psycho-education, and discharge planning needs.  I personally was present and performed or re-performed the history, physical exam and medical decision-making activities of this service and have verified that the service and findings are accurately documented in the student's note, , as addended by me or notated below:  I directly edited the note, as above.   13/07/2021, MD Psychiatrist

## 2021-10-31 NOTE — Progress Notes (Signed)
   10/31/21 0755  Psych Admission Type (Psych Patients Only)  Admission Status Voluntary  Psychosocial Assessment  Patient Complaints Anxiety  Eye Contact Fair  Facial Expression Animated;Anxious  Affect Anxious;Appropriate to circumstance  Speech Logical/coherent  Interaction Assertive  Motor Activity Slow  Appearance/Hygiene Unremarkable  Behavior Characteristics Cooperative;Appropriate to situation;Anxious  Mood Anxious;Pleasant  Thought Process  Coherency Concrete thinking  Content WDL  Delusions None reported or observed  Perception WDL  Hallucination None reported or observed  Judgment Poor  Confusion None  Danger to Self  Current suicidal ideation? Denies  Danger to Others  Danger to Others None reported or observed

## 2021-10-31 NOTE — B[facility] Counselor (Signed)
CSW spoke with John Pt's mother who provided John CSW with a phone number to John Vaughan or John Vaughan in [place], [phone].  She states that John Vaughan will help John Pt get into a shelter and rehab program after discharge.  CSW will give this phone number to John Pt.  John mother was unable to provide John Pt with this phone number due to her 50B protective order that is currently in place.

## 2021-10-31 NOTE — [facility] Group Notes (Signed)
[facility] Group Notes:  (Nursing/MHT/Case Management/Adjunct)  Date:  10/31/2021  Time:  11:41 AM  Type of Therapy:  Psychoeducational Skills  Participation Level:  Active  Participation Quality:  Appropriate  Affect:  Appropriate  Cognitive:  Appropriate  Insight:  Appropriate  Engagement in Group:  Engaged  Modes of Intervention:  Education  Summary of Progress/Problems:Patient was able to identify was to help himself when he felt overwhelmed with the stressors of life. Educated patient on coping strategies to implement when feeling overwhelmed. Reminded client of the coping skills already learned.   Reymundo Poll 10/31/2021, 11:41 AM

## 2021-11-01 DIAGNOSIS — F332 Major depressive disorder, recurrent severe without psychotic features: Secondary | ICD-10-CM | POA: Diagnosis not present

## 2021-11-01 LAB — BASIC METABOLIC PANEL
Anion gap: 8 (ref 5–15)
BUN: 17 mg/dL (ref 6–20)
CO2: 27 mmol/L (ref 22–32)
Calcium: 9.3 mg/dL (ref 8.9–10.3)
Chloride: 104 mmol/L (ref 98–111)
Creatinine, Ser: 0.85 mg/dL (ref 0.61–1.24)
GFR, Estimated: >60 mL/min (ref >60)
Glucose, Bld: 91 mg/dL (ref 70–99)
Potassium: 4 mmol/L (ref 3.5–5.1)
Sodium: 139 mmol/L (ref 135–145)

## 2021-11-01 LAB — CARBAMAZEPINE LEVEL, TOTAL: Carbamazepine Lvl: 8.9 ug/mL (ref 4.0–12.0)

## 2021-11-01 NOTE — Progress Notes (Addendum)
[facility] Medical Student Progress Note  11/01/2021 12:00 PM John Vaughan  MRN:  [id number] Principal Problem: Major depressive disorder, recurrent episode, severe (HCC) Diagnosis: Principal Problem:   Major depressive disorder, recurrent episode, severe (HCC) Active Problems:   GAD (generalized anxiety disorder)   Alcohol use disorder, severe, dependence (HCC)   PTSD (post-traumatic stress disorder)  Subjective: John Vaughan is a 30 yo M with PMH of major depressive disorder, generalized anxiety disorder with panic attacks, PTSD, and alcohol use disorder (with a history of alcohol withdrawal seizures), who presents for evaluation for worsening depression and suicidal thoughts.   Today's note: Record reviewed, report received and care reviewed with members of our interdisciplinary team.    Pt remains in bed for the interview today due to fatigue and rib pain. He reports that his mood is same as yesterday, rating it a 5/10 (10 being the best). He attributes mood not improving due to stress about his 50B and his rib pain. He was seen joking, laughing, and appearing to have pleasant conversations with others all yesterday.  He is endorsing worsening rib pain and thinks he might've slept on his side wrong. His stuttering and brain fog have resolved.  He reported good sleep and appetite.  He attends and participates in group activities.   He denied having suicidal thoughts, which was explored carefully.  He denies HI. He denies AH, VH, paranoia.   Past Psychiatric History: Previous Psych Diagnoses: MDD, ADHD, Withdrawal seizures from AUD Previous inpatient treatment: [facility] 05/24/2021 and 06/06/21 for SI and worsening alcohol use, 12/10/20 [facility] 12/10/21 SI, [facility] 12/03/20 alcohol induced depression, [facility] 07/13/20 MDD, seizures Current/Prior outpatient treatment: gabapentin, carbamazepine, zoloft Psychiatric medication hx: gabapentin, zoloft, carbamazepine, klonopin  Psychiatric medication  compliance hx: compliant prior to running out and not being able to afford 2-3 months ago  Past Medical History:  Past Medical History:  Diagnosis Date   Alcohol abuse    Anxiety    Depression    History reviewed. No pertinent surgical history. Family History:  Family History  Problem Relation Age of Onset   Anxiety disorder Mother    Alcohol abuse Father    Anxiety disorder Maternal Aunt    Anxiety disorder Maternal Grandmother    Alcohol abuse Paternal Grandfather    Family Psychiatric  History:  Diagnoses: bipolar disorder, depression, anxiety on maternal and paternal side Rx: not discussed SA/HA: none known  Social History:  Social History   Substance and Sexual Activity  Alcohol Use Yes   Alcohol/week: 2.0 standard drinks   Types: 2 Cans of beer per week   Comment: states that he drinks 2 beers daily     Social History   Substance and Sexual Activity  Drug Use Not Currently   Types: Marijuana, Cocaine   Comment: cocaine in last month    Social History   Socioeconomic History   Marital status: Single    Spouse name: Not on file   Number of children: Not on file   Years of education: Not on file   Highest education level: Not on file  Occupational History   Not on file  Tobacco Use   Smoking status: Every Day    Packs/day: 1.50    Types: Cigarettes   Smokeless tobacco: Former    Types: Snuff  Vaping Use   Vaping Use: Never used  Substance and Sexual Activity   Alcohol use: Yes    Alcohol/week: 2.0 standard drinks    Types: 2 Cans of beer  per week    Comment: states that he drinks 2 beers daily   Drug use: Not Currently    Types: Marijuana, Cocaine    Comment: cocaine in last month   Sexual activity: Not Currently  Other Topics Concern   Not on file  Social History Narrative   Not on file   Social Determinants of Health   Financial Resource Strain: Not on file  Food Insecurity: Not on file  Transportation Needs: Not on file  Physical  Activity: Not on file  Stress: Not on file  Social Connections: Not on file    Sleep: Good  Appetite:  Good  Current Medications: Current Facility-Administered Medications  Medication Dose Route Frequency Provider Last Rate Last Admin   acetaminophen (TYLENOL) tablet 650 mg  650 mg Oral Q6H PRN Carlyn Reichert, MD   650 mg at 10/29/21 0826   alum & mag hydroxide-simeth (MAALOX/MYLANTA) 200-200-20 MG/5ML suspension 30 mL  30 mL Oral Q4H PRN Novella Olive, NP       carbamazepine (TEGRETOL) chewable tablet 100 mg  100 mg Oral TID Carlyn Reichert, MD   100 mg at 11/01/21 1008   dicyclomine (BENTYL) capsule 10 mg  10 mg Oral TID AC & HS Novella Olive, NP   10 mg at 11/01/21 0625   DULoxetine (CYMBALTA) DR capsule 40 mg  40 mg Oral Daily Carlyn Reichert, MD   40 mg at 11/01/21 1008   gabapentin (NEURONTIN) capsule 300 mg  300 mg Oral TID Carlyn Reichert, MD   300 mg at 11/01/21 1007   hydrOXYzine (ATARAX/VISTARIL) tablet 50 mg  50 mg Oral TID PRN Roselle Locus, MD   50 mg at 10/31/21 1604   ibuprofen (ADVIL) tablet 100 mg  100 mg Oral Q6H PRN Carlyn Reichert, MD   100 mg at 10/30/21 2119   lidocaine (LIDODERM) 5 % 2 patch  2 patch Transdermal Q24H Roselle Locus, MD   2 patch at 10/31/21 1602   magnesium hydroxide (MILK OF MAGNESIA) suspension 30 mL  30 mL Oral Daily PRN Novella Olive, NP   30 mL at 10/28/21 1824   multivitamin with minerals tablet 1 tablet  1 tablet Oral Daily Novella Olive, NP   1 tablet at 10/31/21 0756   nicotine (NICODERM CQ - dosed in mg/24 hours) patch 21 mg  21 mg Transdermal Daily Alannie Amodio, Harrold Donath, MD   21 mg at 11/01/21 1008   pantoprazole (PROTONIX) EC tablet 40 mg  40 mg Oral Daily Novella Olive, NP   40 mg at 11/01/21 1696   thiamine tablet 100 mg  100 mg Oral Daily Novella Olive, NP   100 mg at 10/31/21 0755   traZODone (DESYREL) tablet 150 mg  150 mg Oral QHS Keyona Emrich, Harrold Donath, MD   150 mg at 10/31/21 2125    Lab Results:  Results for  orders placed or performed during the hospital encounter of 10/24/21 (from the past 48 hour(s))  Basic metabolic panel     Status: None   Collection Time: 11/01/21  6:39 AM  Result Value Ref Range   Sodium 139 135 - 145 mmol/L   Potassium 4.0 3.5 - 5.1 mmol/L   Chloride 104 98 - 111 mmol/L   CO2 27 22 - 32 mmol/L   Glucose, Bld 91 70 - 99 mg/dL    Comment: Glucose reference range applies only to samples taken after fasting for at least 8 hours.   BUN 17 6 -  20 mg/dL   Creatinine, Ser 6.28 0.61 - 1.24 mg/dL   Calcium 9.3 8.9 - 36.6 mg/dL   GFR, Estimated >29 >47 mL/min    Comment: (NOTE) Calculated using the CKD-EPI Creatinine Equation (2021)    Anion gap 8 5 - 15    Comment: Performed at [facility], [address], [place], [place] [address]  Carbamazepine level, total     Status: None   Collection Time: 11/01/21  6:39 AM  Result Value Ref Range   Carbamazepine Lvl 8.9 4.0 - 12.0 ug/mL    Comment: Performed at [facility], [address], [place], [place] [address]     Blood Alcohol level:  Lab Results  Component Value Date   ETH 288 (H) 10/24/2021   ETH 261 (H) 06/05/2021    Metabolic Disorder Labs: Lab Results  Component Value Date   HGBA1C 5.2 10/25/2021   MPG 102.54 10/25/2021   No results found for: PROLACTIN Lab Results  Component Value Date   CHOL 208 (H) 10/25/2021   TRIG 218 (H) 10/25/2021   HDL 71 10/25/2021   CHOLHDL 2.9 10/25/2021   VLDL 44 (H) 10/25/2021   LDLCALC 93 10/25/2021    Physical Findings: CIWA:  CIWA-Ar Total: 0 COWS:     Musculoskeletal: Strength & Muscle Tone: within normal limits Gait & Station: normal Patient leans: N/A  Psychiatric Specialty Exam:  Presentation  General Appearance: laying in bed  Eye Contact: avoidant    Speech:Clear and Coherent; Normal Rate  Speech Volume:Normal  Handedness:Right   Mood and Affect  Mood: anxious Affect: congruent, full range  Thought Process   Thought Processes:Coherent; Linear; Goal Directed  Descriptions of Associations:Intact  Orientation:Full (Time, Place and Person)  Thought Content:Logical No suicidal thoughts  History of Schizophrenia/Schizoaffective disorder:No  Duration of Psychotic Symptoms:No data recorded Hallucinations:Hallucinations: None  Ideas of Reference:None  Suicidal Thoughts: denied  Homicidal Thoughts: denied    Sensorium  Memory:Immediate Good; Recent Good  Judgment:Fair  Insight:Fair   Executive Functions  Concentration:Fair  Attention Span:Fair  Recall:Fair  Fund of Knowledge:Fair  Language:Fair   Psychomotor Activity  Psychomotor Activity: restlessness   Assets  Assets:Desire for Improvement; Physical Health; Resilience   Sleep  Sleep: Good    Physical Exam: Physical Exam HENT:     Head: Normocephalic and atraumatic.  Pulmonary:     Effort: Pulmonary effort is normal.  Neurological:     Mental Status: He is alert.  Psychiatric:        Thought Content: Thought content normal.   Review of Systems  Musculoskeletal:        Rib pain from fall  Psychiatric/Behavioral:  Positive for depression. Negative for hallucinations and suicidal ideas. The patient is nervous/anxious. The patient does not have insomnia.    Blood pressure 94/67, pulse 76, temperature (!) 97.5 F (36.4 C), temperature source Oral, resp. rate 18, height 6' (1.829 m), weight 78.5 kg, SpO2 100 %. Body mass index is 23.46 kg/m.   Treatment Plan Summary: Daily contact with patient to assess and evaluate symptoms and progress in treatment and Medication management   Diagnoses / Active Problems: Major depressive disorder, severe, recurrent, without psychotic features Generalized anxiety disorder panic attacks PTSD Alcohol use disorder, severe Reported history of ADHD  PLAN: Safety and Monitoring:             -- Voluntary admission to inpatient psychiatric unit for safety, stabilization  and treatment             --  Daily contact with patient to assess and evaluate symptoms and progress in treatment             -- Patient's case to be discussed in multi-disciplinary team meeting             -- Observation Level : q15 minute checks             -- Vital signs:  q12 hours             -- Precautions: suicide, elopement, and assault   2. Psychiatric Diagnoses and Treatment:  - continue Cymbalta to 40 mg once daily for mood, anxiety, PTSD, reported neuropathic pain - continue carbamazepine  to 100 mg 3 times daily for mood stabilization and seizure prophylaxis.  level 11/01/2021-8.9.   - continue gabapentin to 300 mg 3 times daily for anxiety and neuropathy - Continue trazodone 150 mg as needed nightly for sleep - continue Hydroxyzine 50 mg PRN  - Continue CIWA with scheduled Ativan and also as needed Ativan for CIWA score greater than 8 - Continue other medications as ordered  3. Medical Issues Being Addressed:              Tobacco Use Disorder             -- Nicotine patch 21mg /24 hours              -- Smoking cessation encouraged  Medical Management - 11-01-21 BMP: WNL - 11-01-21 Carbamazepine level WNL   Sharene Skeans, Medical Student  11/01/2021, 12:00 PM  Total Time Spent in Direct Patient Care:  I personally spent 30 minutes on the unit in direct patient care. The direct patient care time included face-to-face time with the patient, reviewing the patient's chart, communicating with other professionals, and coordinating care. Greater than 50% of this time was spent in counseling or coordinating care with the patient regarding goals of hospitalization, psycho-education, and discharge planning needs.  I personally was present and performed or re-performed the history, physical exam and medical decision-making activities of this service and have verified that the service and findings are accurately documented in the student's note, , as addended by me or notated  below:  I directly edited the note, as above.   Phineas Inches, MD Psychiatrist

## 2021-11-01 NOTE — Progress Notes (Addendum)
   11/01/21 1400  Psych Admission Type (Psych Patients Only)  Admission Status Voluntary  Psychosocial Assessment  Patient Complaints Anxiety  Eye Contact Fair  Facial Expression Animated;Anxious  Affect Anxious;Appropriate to circumstance  Speech Logical/coherent  Interaction Assertive  Motor Activity Slow  Appearance/Hygiene Unremarkable  Behavior Characteristics Cooperative;Appropriate to situation  Mood Anxious;Pleasant  Aggressive Behavior  Effect No apparent injury  Thought Process  Coherency Concrete thinking  Content WDL  Delusions None reported or observed  Perception WDL  Hallucination None reported or observed  Judgment Poor  Confusion None  Danger to Self  Current suicidal ideation? Denies  Self-Injurious Behavior Some self-injurious ideation observed or expressed.  No lethal plan expressed   Agreement Not to Harm Self Yes  Description of Agreement verbal contract for safety  Danger to Others  Danger to Others None reported or observed   D. Pt stayed in bed for much of the morning, did not attend groups, but did get up for lunch. Pt is friendly during interactions, and denies SI/HI and AVH   A. Labs and vitals monitored. Pt given and educated on medications. Pt supported emotionally and encouraged to express concerns and ask questions.   R. Pt remains safe with 15 minute checks. Will continue POC.

## 2021-11-01 NOTE — Progress Notes (Signed)
Adult Psychoeducational Group Note  Date:  11/01/2021 Time:  8:51 PM  Group Topic/Focus:  Wrap-Up Group:   The focus of this group is to help patients review their daily goal of treatment and discuss progress on daily workbooks.  Participation Level:  Active  Participation Quality:  Appropriate  Affect:  Appropriate  Cognitive:  Appropriate  Insight: Appropriate  Engagement in Group:  Engaged  Modes of Intervention:  Discussion  Additional Comments:  Patient said his day was a 4. His goal for to day was to be happy he did not achieved his goal. Coping skills friends  [name] 11/01/2021, 8:51 PM

## 2021-11-01 NOTE — B[facility] Counselor (Signed)
CSW spoke with the Pt's mother who states that she informed the court this morning that her son would not be at his court date due to being in the hospital.  Mrs. Steven's also wanted to check and make sure that her son received the phone number to the friend that will be taking her son to [place] to the shelter/treatment program.  CSW confirmed for Mrs. Steven's that she did provide the Pt with that phone number and that her son has stated that he will be calling the friend today to schedule a ride to the shelter tomorrow.  CSW also confirmed for Mrs. Steven's that her son's discharge date will be 11/02/2021.  Mrs. Steven's states that she will also speak with the friend to make sure that they are aware of this information.

## 2021-11-01 NOTE — Progress Notes (Signed)
Pt stated he was doing better , pt given PRN Vistaril and Ibuprofen per [facility]    11/01/21 2200  Psych Admission Type (Psych Patients Only)  Admission Status Voluntary  Psychosocial Assessment  Patient Complaints Anxiety  Eye Contact Fair  Facial Expression Animated;Anxious  Affect Anxious;Appropriate to circumstance  Speech Logical/coherent  Interaction Assertive  Motor Activity Slow  Appearance/Hygiene Unremarkable  Behavior Characteristics Cooperative  Mood Anxious;Pleasant  Aggressive Behavior  Effect No apparent injury  Thought Process  Coherency Concrete thinking  Content WDL  Delusions None reported or observed  Perception WDL  Hallucination None reported or observed  Judgment Poor  Confusion None  Danger to Self  Current suicidal ideation? Denies  Self-Injurious Behavior Some self-injurious ideation observed or expressed.  No lethal plan expressed   Agreement Not to Harm Self Yes  Description of Agreement verbal contract for safety  Danger to Others  Danger to Others None reported or observed

## 2021-11-01 NOTE — Progress Notes (Signed)
Pt stated he did not want to D/C tomorrow, he wanted to stay one more day to "get my medications right" . Pt was informed that decision would be up to the doctor .

## 2021-11-01 NOTE — Progress Notes (Signed)
Recreation Therapy Notes  Date: 11.9.22 Time: 0930-1000 Location: [address] Hall Dayroom  Group Topic: Stress Management   Goal Area(s) Addresses:  Patient will actively participate in stress management techniques presented during session.  Patient will successfully identify benefit of practicing stress management post d/c.   Intervention: Relaxation exercise with ambient sound and script   Activity: Guided Imagery. LRT provided education, instruction, and demonstration on practice of visualization via guided imagery. Patient was asked to participate in the technique introduced during session. LRT debriefed including topics of mindfulness, stress management and specific scenarios each patient could use these techniques. Patients were given suggestions of ways to access scripts post d/c and encouraged to explore Youtube and other apps available on smartphones, tablets, and computers.  Education:  Stress Management, Discharge Planning.   Clinical Observations/Feedback: Group did not occur due to orientation group going over 20 minutes.  Packets were given with information on ways to manage stress, techniques that can be used, proper breathing techniques and how to properly do the techniques given such as progressive muscle relaxation, diaphragmatic breathing and mindfulness meditation.     Caroll Rancher, LRT, CTRS        Lillia Abed, Anoushka Divito A 11/01/2021 12:05 PM

## 2021-11-01 NOTE — Group Note (Signed)
LCSW Group Therapy Note   Group Date: 11/01/2021 Start Time: 1300 End Time: 1400   Type of Therapy and Topic:  Group Therapy:   Participation Level:  Did Not Attend  Description of Group:Group encouraged increased engagement and participation through discussion focused on Safety Planning. Patients worked both individually and collaboratively to create and discuss the different elements of a safety plan, including identifying warning signs, coping skills, professional supports, people you can ask for help, how to make the environment safe, and reasons for life worth living. Remainder of group was spent filling out individual safety plans to be placed in patient charts.   Therapeutic Goal(s): Identify warning signs and triggers Identify positive coping strategies Identify professional and personal supports when experiencing a mental health crisis Identify ways in which you can make the environment safe Identify reasons for life worth living Identify the steps to completing a safety plan and provide education on completing a safety plan at discharge   Summary of Patient Progress:  Did Not Attend   John Vaughan, [name] 11/01/2021  1:35 PM

## 2021-11-01 NOTE — Progress Notes (Signed)
Pt did not attend group. 

## 2021-11-02 DIAGNOSIS — F332 Major depressive disorder, recurrent severe without psychotic features: Secondary | ICD-10-CM | POA: Diagnosis not present

## 2021-11-02 MED ORDER — GABAPENTIN 300 MG PO CAPS
300.0000 mg | ORAL_CAPSULE | Freq: Three times a day (TID) | ORAL | 0 refills | Status: DC
Start: 2021-11-02 — End: 2021-11-10

## 2021-11-02 MED ORDER — CARBAMAZEPINE 100 MG PO CHEW: 100.0000 mg | CHEWABLE_TABLET | Freq: Three times a day (TID) | ORAL | 0 refills | Status: DC

## 2021-11-02 MED ORDER — DULOXETINE HCL 40 MG PO CPEP: 40.0000 mg | ORAL_CAPSULE | Freq: Every day | ORAL | 0 refills | Status: DC

## 2021-11-02 MED ORDER — NICOTINE 21 MG/24HR TD PT24: 21.0000 mg | MEDICATED_PATCH | Freq: Every day | TRANSDERMAL | 0 refills | Status: DC

## 2021-11-02 NOTE — Progress Notes (Signed)
Discharge Note:  Patient discharged with law enforcement.  Patient denied SI and HI. Denied A/V hallucinations. Suicide prevention information given and discussed with patient who stated they understood and had no questions. Patient stated they received all their belongings, clothing, toiletries, misc items, etc. Patient stated they appreciated all assistance received from [facility] staff. All required discharge information given to patient.

## 2021-11-02 NOTE — Discharge Summary (Addendum)
Physician Discharge Summary Note  Patient:  John Vaughan is an 30 y.o., male MRN:  [id number] DOB:  11-01-1991 Patient phone:  [phone] (home)  Patient address:   [address] [place] [address],  Total Time Spent in Direct Patient Care on Day of Discharge: I personally spent 30 minutes on the unit in direct patient care. The direct patient care time included face-to-face time with the patient, reviewing the patient's chart, communicating with other professionals, and coordinating care. Greater than 50% of this time was spent in counseling or coordinating care with the patient regarding goals of hospitalization, psycho-education, and discharge planning needs.   Date of Admission:  10/24/2021 Date of Discharge: 11/02/2021  Reason for Admission:   Per H and P: John Vaughan is a 31 yo M with PMH of major depressive disorder, generalized anxiety disorder with panic attacks, PTSD, and alcohol use disorder (with a history of alcohol withdrawal seizures), who presents for evaluation for worsening depression and suicidal thoughts. Per ED note, he had a mechanical fall down stairs a couple days after which he presented for rib pain (no evidence of fx on imaging). Additionally per ED note, he was endorsing worsening depression and active SI, as well as homicidal thoughts towards his mother at times.  Patient was medically cleared by outside emergency department, prior to admission to the psychiatric unit.   Upon interviewing him at [facility], he reports worsening depression and irritability over the last few months.  He reports multiple life stressors including anniversary of his father's death, financial troubles, interpersonal conflicts with family, which contributed to his worsening mood and depression.  He reports not having processed his father's death, which occurred 2 years ago, out of the blue, when he found his father had passed away after sudden heart attack.  Patient reports  father was supportive.  Patient explains irritable outbursts, as a byproduct of repressing his grief. He reports that he has flashbacks and nightmares of his father's passing. He reports that he suffers from depression, anxiety, and panic attacks, and that his mood often fluctuates from one minute to the next.  He reports depressed mood, anhedonia, poor sleep, poor appetite, poor concentration, low energy.  He reports having told the emergency crews after he had fallen, that he did not wish to live, but reports at this time he no longer has suicidal thoughts.  Denies having homicidal thoughts. Reports anxiety is elevated, constant, chronic, generalized. He reports that his panic attacks make him feel like he's having a heart attack - hot, SOB, diaphoresis - and that he tries to physically cool himself down to manage them.    He denies periods of feeling on top of the world with increased energy and decreased need for sleep for 4 days or longer.  He reports visual changes in the form of seeing brighter colors and a spider on his pillow, but only in the context of alcohol withdrawal.  He denies thought control, AH, and IOR.   Principal Problem: Major depressive disorder, recurrent episode, severe (HCC) Discharge Diagnoses: Principal Problem:   Major depressive disorder, recurrent episode, severe (HCC) Active Problems:   GAD (generalized anxiety disorder)   Alcohol use disorder, severe, dependence (HCC)   PTSD (post-traumatic stress disorder)   Past Psychiatric History:  Previous Psych Diagnoses: MDD, ADHD, Withdrawal seizures from AUD Previous inpatient treatment: [facility] 05/24/2021 and 06/06/21 for SI and worsening alcohol use, 12/10/20 [facility] 12/10/21 SI, [facility] 12/03/20 alcohol induced depression, [facility] 07/13/20 MDD, seizures Current/Prior outpatient  treatment: gabapentin, carbamazepine, zoloft Psychiatric medication hx: gabapentin, zoloft, carbamazepine, klonopin  Psychiatric medication compliance hx:  compliant prior to running out and not being able to afford 2-3 months ago   Past Medical History:  Past Medical History:  Diagnosis Date   Alcohol abuse    Anxiety    Depression    History reviewed. No pertinent surgical history. Family History:  Family History  Problem Relation Age of Onset   Anxiety disorder Mother    Alcohol abuse Father    Anxiety disorder Maternal Aunt    Anxiety disorder Maternal Grandmother    Alcohol abuse Paternal Grandfather    Family psychiatric history: Diagnoses: bipolar disorder, depression, anxiety on maternal and paternal side Rx: not discussed SA/HA: none known  Social History:  Social History   Substance and Sexual Activity  Alcohol Use Yes   Alcohol/week: 2.0 standard drinks   Types: 2 Cans of beer per week   Comment: states that he drinks 2 beers daily     Social History   Substance and Sexual Activity  Drug Use Not Currently   Types: Marijuana, Cocaine   Comment: cocaine in last month    Social History   Socioeconomic History   Marital status: Single    Spouse name: Not on file   Number of children: Not on file   Years of education: Not on file   Highest education level: Not on file  Occupational History   Not on file  Tobacco Use   Smoking status: Every Day    Packs/day: 1.50    Types: Cigarettes   Smokeless tobacco: Former    Types: Snuff  Vaping Use   Vaping Use: Never used  Substance and Sexual Activity   Alcohol use: Yes    Alcohol/week: 2.0 standard drinks    Types: 2 Cans of beer per week    Comment: states that he drinks 2 beers daily   Drug use: Not Currently    Types: Marijuana, Cocaine    Comment: cocaine in last month   Sexual activity: Not Currently  Other Topics Concern   Not on file  Social History Narrative   Not on file   Social Determinants of Health   Financial Resource Strain: Not on file  Food Insecurity: Not on file  Transportation Needs: Not on file  Physical Activity: Not on  file  Stress: Not on file  Social Connections: Not on file    Hospital Course:   Patient is a 30 year old male with a psychiatric history of alcohol use disorder, major depressive disorder, generalized anxiety disorder, and PTSD, who was admitted to the psychiatric hospital for evaluation and treatment of depression, suicidal thoughts, and auditory hallucinations.   During the patient's hospitalization, patient had extensive initial psychiatric evaluation, and follow-up psychiatric evaluations every day.   Psychiatric diagnoses provided upon initial assessment:  Major depressive disorder, severe, recurrent, without psychotic features Generalized anxiety disorder panic attacks PTSD Alcohol use disorder, severe Reported history of ADHD   Patient's psychiatric medications were adjusted on admission:  Stop Zoloft Start Cymbalta 20 mg once daily, for mood, anxiety, PTSD, reported neuropathic pain Continue carbamazepine 100 mg 3 times daily for mood stabilization and seizure prophylaxis Continue gabapentin 400 mg 3 times daily for anxiety and neuropathy Continue trazodone as needed nightly for sleep Continue CIWA with scheduled Ativan and also as needed Ativan for CIWA score greater than 8 Continue other medications as ordered   During the hospitalization, other adjustments were made to the  patient's psychiatric medication regimen:  -Cymbalta was titrated to current dose, day of discharge, which is 40 mg once daily -Gabapentin was increased to 600 mg 3 times a day, but patient reported having dizziness, and the dose was decreased to the dose at day of discharge, which is 300 mg 3 times daily -Carbamazepine was increased to 200 mg 3 times daily, the patient reported having dizziness and the dose was decreased to the dose at day of discharge, which is 100 mg 3 times daily. CBZ level of 8.9 on discharge. -Trazodone was increased to dose at discharge, which is 150 mg once daily    Physical  Findings: CIWA:  CIWA-Ar Total: 0  Musculoskeletal: Strength & Muscle Tone: within normal limits Gait & Station: normal Patient leans: N/A   Psychiatric Specialty Exam:  Presentation  General Appearance: Appropriate for Environment; Casual; Fairly Groomed  Eye Contact:Good  Speech:Clear and Coherent; Normal Rate  Speech Volume:Normal  Handedness:Right   Mood and Affect  Mood:Anxious; Euthymic  Affect:Congruent; Full Range; Appropriate   Thought Process  Thought Processes:Linear  Descriptions of Associations:Intact  Orientation:Full (Time, Place and Person)  Thought Content:Logical  History of Schizophrenia/Schizoaffective disorder:No  Duration of Psychotic Symptoms:No data recorded Hallucinations:Hallucinations: None  Ideas of Reference:None  Suicidal Thoughts:Suicidal Thoughts: No  Homicidal Thoughts:Homicidal Thoughts: No   Sensorium  Memory:Immediate Good; Remote Good; Recent Good  Judgment:Good  Insight:Good   Executive Functions  Concentration:Good  Attention Span:Good  Recall:Good  Fund of Knowledge:Good  Language:Good   Psychomotor Activity  Psychomotor Activity:Psychomotor Activity: Normal   Assets  Assets:Desire for Improvement; Physical Health; Resilience   Sleep  Sleep:Sleep: Good    Physical Exam: Physical Exam Constitutional:      Appearance: He is not ill-appearing or toxic-appearing.  Pulmonary:     Effort: Pulmonary effort is normal.  Musculoskeletal:        General: Normal range of motion.  Neurological:     General: No focal deficit present.     Mental Status: He is alert.   Review of Systems  Gastrointestinal:  Negative for nausea and vomiting.  Neurological:  Positive for headaches.  Blood pressure 116/82, pulse 75, temperature 98.1 F (36.7 C), resp. rate 18, height 6' (1.829 m), weight 78.5 kg, SpO2 100 %. Body mass index is 23.46 kg/m.   Social History   Tobacco Use  Smoking Status Every  Day   Packs/day: 1.50   Types: Cigarettes  Smokeless Tobacco Former   Types: Snuff   Tobacco Cessation:  A prescription for an FDA-approved tobacco cessation medication provided at discharge   Blood Alcohol level:  Lab Results  Component Value Date   ETH 288 (H) 10/24/2021   ETH 261 (H) 06/05/2021    Metabolic Disorder Labs:  Lab Results  Component Value Date   HGBA1C 5.2 10/25/2021   MPG 102.54 10/25/2021   No results found for: PROLACTIN Lab Results  Component Value Date   CHOL 208 (H) 10/25/2021   TRIG 218 (H) 10/25/2021   HDL 71 10/25/2021   CHOLHDL 2.9 10/25/2021   VLDL 44 (H) 10/25/2021   LDLCALC 93 10/25/2021    See Psychiatric Specialty Exam and Suicide Risk Assessment completed by Attending Physician prior to discharge.  Discharge destination:  police custody  Is patient on multiple antipsychotic therapies at discharge:  No   Has Patient had three or more failed trials of antipsychotic monotherapy by history:  No  Recommended Plan for Multiple Antipsychotic Therapies: NA   Allergies as of 11/02/2021  No Known Allergies      Medication List     STOP taking these medications    sertraline 50 MG tablet Commonly known as: ZOLOFT       TAKE these medications      Indication  carbamazepine 100 MG chewable tablet Commonly known as: TEGRETOL Chew 1 tablet (100 mg total) by mouth 3 (three) times daily.  Indication: Manic-Depression, seizure prevention   dicyclomine 10 MG capsule Commonly known as: BENTYL Take 1 capsule (10 mg total) by mouth 4 (four) times daily -  before meals and at bedtime.  Indication: Irritable Bowel Syndrome   DULoxetine HCl 40 MG Cpep Take 40 mg by mouth daily.  Indication: Major Depressive Disorder, Disease of the Peripheral Nerves   gabapentin 300 MG capsule Commonly known as: NEURONTIN Take 1 capsule (300 mg total) by mouth 3 (three) times daily. What changed:  medication strength how much to take   Indication: Abuse or Misuse of Alcohol, Disease of the Peripheral Nerves   hydrOXYzine 50 MG tablet Commonly known as: ATARAX/VISTARIL Take 1 tablet (50 mg total) by mouth 3 (three) times daily as needed for anxiety.  Indication: Motion Sickness   nicotine 21 mg/24hr patch Commonly known as: NICODERM CQ - dosed in mg/24 hours Place 1 patch (21 mg total) onto the skin daily.  Indication: Nicotine Addiction   pantoprazole 40 MG tablet Commonly known as: PROTONIX Take 1 tablet (40 mg total) by mouth daily.  Indication: Indigestion   traZODone 100 MG tablet Commonly known as: DESYREL Take 1 tablet (100 mg total) by mouth at bedtime as needed for sleep.  Indication: Trouble Sleeping        Follow-up Information     [facility]. Go to.   Specialty: Behavioral Health Why: Please go to this provider for therapy and medication management services, during walk in hours:  Monday through Wednesday, from 7:45 am to 11:00 am.  Services are provided on a first come, first served basis.  Please arrive early. Contact information: [address] [place] [address] [phone]                Follow-up recommendations:   Activity as tolerated. Diet as recommended by PCP. Keep all scheduled follow-up appointments as recommended.  Patient is instructed to take all prescribed medications as recommended. Report any side effects or adverse reactions to your outpatient psychiatrist. Patient is instructed to abstain from alcohol and illegal drugs while on prescription medications. In the event of worsening symptoms, patient is instructed to call the crisis hotline, 911, or go to the nearest emergency department for evaluation and treatment.  Prescriptions given at discharge. Patient agreeable to plan. Given opportunity to ask questions. Appears to feel comfortable with discharge.  Patient is also instructed prior to discharge to: Take all medications  as prescribed by mental healthcare provider. Report any adverse effects and or reactions from the medicines to outpatient provider promptly. Patient has been instructed & cautioned: To not engage in alcohol and or illegal drug use while on prescription medicines. In the event of worsening symptoms,  patient is instructed to call the crisis hotline, 911 and or go to the nearest ED for appropriate evaluation and treatment of symptoms. To follow-up with primary care provider for other medical issues, concerns and or health care needs  The patient was evaluated each day by a clinical provider to ascertain response to treatment. Improvement was noted by the patient's report of decreasing symptoms, improved  sleep and appetite, affect, medication tolerance, behavior, and participation in unit programming.  Patient was asked each day to complete a self inventory noting mood, mental status, pain, new symptoms, anxiety and concerns.  Patient responded well to medication and being in a therapeutic and supportive environment. Positive and appropriate behavior was noted and the patient was motivated for recovery. The patient worked closely with the treatment team and case manager to develop a discharge plan with appropriate goals. Coping skills, problem solving as well as relaxation therapies were also part of the unit programming.  By the day of discharge patient was in much improved condition than upon admission.  Symptoms were reported as significantly decreased or resolved completely. The patient was motivated to continue taking medication with a goal of continued improvement in mental health.   Signed: Carlyn Reichert, MD 11/02/2021, 6:59 PM

## 2021-11-02 NOTE — Progress Notes (Signed)
  [facility] Adult Case Management Discharge Plan :  Will you be returning to the same living situation after discharge:  No. At discharge, do you have transportation home?: Yes,  Other/Safe Transport Do you have the ability to pay for your medications: Yes,  Community   Release of information consent forms completed and in the chart;  Patient's signature needed at discharge.  Patient to Follow up at:  Follow-up Information     [facility]. Go to.   Specialty: Behavioral Health Why: Please go to this provider for therapy and medication management services, during walk in hours:  Monday through Wednesday, from 7:45 am to 11:00 am.  Services are provided on a first come, first served basis.  Please arrive early. Contact information: [address] [place] [address] [phone]                Next level of care provider has access to [facility] Link:yes  Safety Planning and Suicide Prevention discussed: Yes,  with patient and mother      Has patient been referred to the Quitline?: Patient refused referral  Patient has been referred for addiction treatment: Pt. refused referral  [name], LCSWA 11/02/2021, 10:38 AM

## 2021-11-02 NOTE — [facility] Suicide Risk Assessment (Signed)
[facility] Discharge Suicide Risk Assessment   Principal Problem: Major depressive disorder, recurrent episode, severe (HCC) Discharge Diagnoses: Principal Problem:   Major depressive disorder, recurrent episode, severe (HCC) Active Problems:   GAD (generalized anxiety disorder)   Alcohol use disorder, severe, dependence (HCC)   PTSD (post-traumatic stress disorder)   Total Time spent with patient: 20 minutes   Patient is a 30 year old male with a psychiatric history of alcohol use disorder, major depressive disorder, generalized anxiety disorder, and PTSD, who was admitted to the psychiatric hospital for evaluation and treatment of depression, suicidal thoughts, and auditory hallucinations.  During the patient's hospitalization, patient had extensive initial psychiatric evaluation, and follow-up psychiatric evaluations every day.  Psychiatric diagnoses provided upon initial assessment:  Major depressive disorder, severe, recurrent, without psychotic features Generalized anxiety disorder panic attacks PTSD Alcohol use disorder, severe Reported history of ADHD   Patient's psychiatric medications were adjusted on admission:  Stop Zoloft Start Cymbalta 20 mg once daily, for mood, anxiety, PTSD, reported neuropathic pain Continue carbamazepine 100 mg 3 times daily for mood stabilization and seizure prophylaxis Continue gabapentin 400 mg 3 times daily for anxiety and neuropathy Continue trazodone as needed nightly for sleep Continue CIWA with scheduled Ativan and also as needed Ativan for CIWA score greater than 8 Continue other medications as ordered  During the hospitalization, other adjustments were made to the patient's psychiatric medication regimen:  -Cymbalta was titrated to current dose, day of discharge, which is 40 mg once daily -Gabapentin was increased to 600 mg 3 times a day, but patient reported having dizziness, and the dose was decreased to the dose at day of discharge, which  is 300 mg 3 times daily -Carbamazepine was increased to 200 mg 3 times daily, the patient reported having dizziness and the dose was decreased to the dose at day of discharge, which is 100 mg 3 times daily -Trazodone was increased to dose at discharge, which is 150 mg once daily  Patient's care was discussed during the interdisciplinary team meeting every day during the hospitalization.  The patient reported having dizziness, and medications were adjusted.  On the day of discharge, the patient reports the dizziness is improving.  He otherwise denies having side effects to prescribed psychiatric medication.  The patient reports their target psychiatric symptoms of depression and suicidal thoughts, all responded well to the psychiatric medications, and the patient reports overall benefit other psychiatric hospitalization.   Labs were reviewed with the patient, and abnormal results were discussed with the patient.  The patient denied having suicidal thoughts more than 48 hours prior to discharge.  Patient denies having homicidal thoughts.  Patient denies having auditory hallucinations.  Patient denies any visual hallucinations.  Patient denies having paranoid thoughts.  The patient is able to verbalize their individual safety plan to this provider.  It is recommended to the patient to continue psychiatric medications as prescribed, after discharge from the hospital.    It is recommended to the patient to follow up with your outpatient psychiatric provider and PCP.  Discussed with the patient, the impact of alcohol, drugs, tobacco have been there overall psychiatric and medical wellbeing, and abstaining from substance use was recommended the patient.   Musculoskeletal: Strength & Muscle Tone: within normal limits Gait & Station: normal Patient leans: N/A  Psychiatric Specialty Exam  Presentation  General Appearance: Appropriate for Environment; Casual; Fairly Groomed  Eye  Contact:Good  Speech:Clear and Coherent; Normal Rate  Speech Volume:Normal  Handedness:Right   Mood and Affect  Mood:Anxious; Euthymic  Duration of Depression Symptoms: Greater than two weeks  Affect:Congruent; Full Range; Appropriate   Thought Process  Thought Processes:Linear  Descriptions of Associations:Intact  Orientation:Full (Time, Place and Person)  Thought Content:Logical  History of Schizophrenia/Schizoaffective disorder:No  Duration of Psychotic Symptoms:No data recorded Hallucinations:Hallucinations: None  Ideas of Reference:None  Suicidal Thoughts:Suicidal Thoughts: No  Homicidal Thoughts:Homicidal Thoughts: No   Sensorium  Memory:Immediate Good; Remote Good; Recent Good  Judgment:Good  Insight:Good   Executive Functions  Concentration:Good  Attention Span:Good  Recall:Good  Fund of Knowledge:Good  Language:Good   Psychomotor Activity  Psychomotor Activity:Psychomotor Activity: Normal   Assets  Assets:Desire for Improvement; Physical Health; Resilience   Sleep  Sleep:Sleep: Good   Physical Exam: Physical Exam see discharge summary ROS see discharge summary  Blood pressure (!) 89/59, pulse 67, temperature 97.6 F (36.4 C), temperature source Oral, resp. rate 18, height 6' (1.829 m), weight 78.5 kg, SpO2 100 %. Body mass index is 23.46 kg/m.  Mental Status Per Nursing Assessment::   On Admission:  Suicidal ideation indicated by patient  Demographic factors:  Male, Unemployed, Caucasian, Low socioeconomic status Loss Factors: Loss of father, anniversary of father's death prior to hospitalization Historical Factors:  Family history of mental illness or substance abuse, Anniversary of important loss Risk Reduction Factors:  Sense of responsibility to family, Living with another person, especially a relative  Continued Clinical Symptoms:  Anxiety is improved Depressive symptoms have improved.  Patient denies having  suicidal thoughts. PTSD symptoms are improved. Patient has been sober, during hospitalization.  Alcohol detox was managed with medication.  Cognitive Features That Contribute To Risk:  None    Suicide Risk:  Mild:  There are no identifiable suicide plans, no associated intent, mild dysphoria and related symptoms, good self-control (both objective and subjective assessment), few other risk factors, and identifiable protective factors, including available and accessible social support.   Follow-up Information     [facility]. Go to.   Specialty: Behavioral Health Why: Please go to this provider for therapy and medication management services, during walk in hours:  Monday through Wednesday, from 7:45 am to 11:00 am.  Services are provided on a first come, first served basis.  Please arrive early. Contact information: [address] [place] [address] [phone]                Plan Of Care/Follow-up recommendations:   Activity: as tolerated  Diet: heart healthy  Other: -Follow-up with your outpatient psychiatric provider -instructions on appointment date, time, and address (location) are provided to you in discharge paperwork.  -Take your psychiatric medications as prescribed at discharge - instructions are provided to you in the discharge paperwork  -Follow-up with outpatient primary care doctor and other specialists -for management of chronic medical disease and preventive medicine.  -Testing: Follow-up with outpatient provider for abnormal lab results:  Follow-up carbamazepine level in 1 month, and then 6 months after that. Carbamazepine level, on 11/01/2021 was 8.9 Repeat CBC, CMP, TSH in 6 months. Follow-up abnormal lipid panel  -Recommend abstinence from alcohol, tobacco, and other illicit drug use at discharge.   -If your psychiatric symptoms recur, worsening, or if you have side effects to your psychiatric medications, call  your outpatient psychiatric provider, 911, [phone] or go to the nearest emergency department.  -If suicidal thoughts recur, call your outpatient psychiatric provider, 911, [phone] or go to the nearest emergency department.   Cristy Hilts, MD 11/02/2021, 10:12 AM

## 2021-11-10 ENCOUNTER — Ambulatory Visit (HOSPITAL_COMMUNITY)
Admission: EM | Admit: 2021-11-10 | Discharge: 2021-11-10 | Disposition: A | Discharge status: Home / Self Care | Payer: No Payment, Other | Attending: Nurse Practitioner | Admitting: Nurse Practitioner

## 2021-11-10 ENCOUNTER — Encounter (HOSPITAL_COMMUNITY): Payer: Self-pay

## 2021-11-10 ENCOUNTER — Emergency Department (HOSPITAL_COMMUNITY)
Admission: EM | Admit: 2021-11-10 | Discharge: 2021-11-10 | Disposition: A | Discharge status: Other Acute Inpatient Hospital | Payer: Self-pay | Attending: Emergency Medicine | Admitting: Emergency Medicine

## 2021-11-10 ENCOUNTER — Other Ambulatory Visit: Payer: Self-pay

## 2021-11-10 DIAGNOSIS — F17210 Nicotine dependence, cigarettes, uncomplicated: Secondary | ICD-10-CM | POA: Insufficient documentation

## 2021-11-10 DIAGNOSIS — F4310 Post-traumatic stress disorder, unspecified: Secondary | ICD-10-CM | POA: Insufficient documentation

## 2021-11-10 DIAGNOSIS — F411 Generalized anxiety disorder: Secondary | ICD-10-CM | POA: Insufficient documentation

## 2021-11-10 DIAGNOSIS — R45851 Suicidal ideations: Secondary | ICD-10-CM | POA: Insufficient documentation

## 2021-11-10 DIAGNOSIS — F333 Major depressive disorder, recurrent, severe with psychotic symptoms: Secondary | ICD-10-CM | POA: Insufficient documentation

## 2021-11-10 DIAGNOSIS — Z20822 Contact with and (suspected) exposure to covid-19: Secondary | ICD-10-CM | POA: Insufficient documentation

## 2021-11-10 DIAGNOSIS — Z8616 Personal history of COVID-19: Secondary | ICD-10-CM | POA: Insufficient documentation

## 2021-11-10 DIAGNOSIS — F332 Major depressive disorder, recurrent severe without psychotic features: Secondary | ICD-10-CM | POA: Insufficient documentation

## 2021-11-10 DIAGNOSIS — Y900 Blood alcohol level of less than 20 mg/100 ml: Secondary | ICD-10-CM | POA: Insufficient documentation

## 2021-11-10 DIAGNOSIS — F1020 Alcohol dependence, uncomplicated: Secondary | ICD-10-CM | POA: Insufficient documentation

## 2021-11-10 LAB — CBC WITH DIFFERENTIAL/PLATELET
Abs Immature Granulocytes: 0.02 10*3/uL (ref 0.00–0.07)
Basophils Absolute: 0.1 10*3/uL (ref 0.0–0.1)
Basophils Relative: 1 %
Eosinophils Absolute: 0.1 10*3/uL (ref 0.0–0.5)
Eosinophils Relative: 1 %
HCT: 42.7 % (ref 39.0–52.0)
Hemoglobin: 14.7 g/dL (ref 13.0–17.0)
Immature Granulocytes: 0 %
Lymphocytes Relative: 21 %
Lymphs Abs: 1.4 10*3/uL (ref 0.7–4.0)
MCH: 33.7 pg (ref 26.0–34.0)
MCHC: 34.4 g/dL (ref 30.0–36.0)
MCV: 97.9 fL (ref 80.0–100.0)
Monocytes Absolute: 0.8 10*3/uL (ref 0.1–1.0)
Monocytes Relative: 12 %
Neutro Abs: 4.4 10*3/uL (ref 1.7–7.7)
Neutrophils Relative %: 65 %
Platelets: 418 10*3/uL — ABNORMAL HIGH (ref 150–400)
RBC: 4.36 MIL/uL (ref 4.22–5.81)
RDW: 11 % — ABNORMAL LOW (ref 11.5–15.5)
WBC: 6.8 10*3/uL (ref 4.0–10.5)
nRBC: 0 % (ref 0.0–0.2)

## 2021-11-10 LAB — COMPREHENSIVE METABOLIC PANEL
ALT: 15 U/L (ref 0–44)
AST: 17 U/L (ref 15–41)
Albumin: 4.8 g/dL (ref 3.5–5.0)
Alkaline Phosphatase: 79 U/L (ref 38–126)
Anion gap: 10 (ref 5–15)
BUN: 11 mg/dL (ref 6–20)
CO2: 24 mmol/L (ref 22–32)
Calcium: 9.7 mg/dL (ref 8.9–10.3)
Chloride: 104 mmol/L (ref 98–111)
Creatinine, Ser: 0.98 mg/dL (ref 0.61–1.24)
GFR, Estimated: >60 mL/min (ref >60)
Glucose, Bld: 96 mg/dL (ref 70–99)
Potassium: 3.7 mmol/L (ref 3.5–5.1)
Sodium: 138 mmol/L (ref 135–145)
Total Bilirubin: 0.7 mg/dL (ref 0.3–1.2)
Total Protein: 8.2 g/dL — ABNORMAL HIGH (ref 6.5–8.1)

## 2021-11-10 LAB — RESP PANEL BY RT-PCR (FLU A&B, COVID) ARPGX2
Influenza A by PCR: NEGATIVE
Influenza B by PCR: NEGATIVE
SARS Coronavirus 2 by RT PCR: NEGATIVE

## 2021-11-10 LAB — SALICYLATE LEVEL: Salicylate Lvl: <7 mg/dL — ABNORMAL LOW (ref 7.0–30.0)

## 2021-11-10 LAB — RAPID URINE DRUG SCREEN, HOSP PERFORMED
Amphetamines: NOT DETECTED
Barbiturates: NOT DETECTED
Benzodiazepines: NOT DETECTED
Cocaine: NOT DETECTED
Opiates: NOT DETECTED
Tetrahydrocannabinol: NOT DETECTED

## 2021-11-10 LAB — ACETAMINOPHEN LEVEL: Acetaminophen (Tylenol), Serum: <10 ug/mL — ABNORMAL LOW (ref 10–30)

## 2021-11-10 LAB — ETHANOL: Alcohol, Ethyl (B): <10 mg/dL (ref <10)

## 2021-11-10 MED ORDER — THIAMINE HCL 100 MG PO TABS: 100.0000 mg | ORAL_TABLET | Freq: Every day | ORAL | Status: DC

## 2021-11-10 MED ORDER — PANTOPRAZOLE SODIUM 40 MG PO TBEC
40.0000 mg | DELAYED_RELEASE_TABLET | Freq: Every day | ORAL | Status: DC
  Administered 2021-11-10: 40 mg via ORAL
  Filled 2021-11-10: qty 1

## 2021-11-10 MED ORDER — ACETAMINOPHEN 325 MG PO TABS
650.0000 mg | ORAL_TABLET | Freq: Four times a day (QID) | ORAL | Status: DC | PRN
  Administered 2021-11-10: 650 mg via ORAL
  Filled 2021-11-10: qty 2

## 2021-11-10 MED ORDER — CARBAMAZEPINE 100 MG PO CHEW
100.0000 mg | CHEWABLE_TABLET | Freq: Three times a day (TID) | ORAL | Status: DC
  Administered 2021-11-10: 100 mg via ORAL
  Filled 2021-11-10: qty 1

## 2021-11-10 MED ORDER — NICOTINE 21 MG/24HR TD PT24
21.0000 mg | MEDICATED_PATCH | Freq: Every day | TRANSDERMAL | Status: DC
  Administered 2021-11-10: 21 mg via TRANSDERMAL
  Filled 2021-11-10: qty 1

## 2021-11-10 MED ORDER — LORAZEPAM 2 MG/ML IJ SOLN: 0.0000 mg | Freq: Four times a day (QID) | INTRAMUSCULAR | Status: DC

## 2021-11-10 MED ORDER — DULOXETINE HCL 40 MG PO CPEP
40.0000 mg | ORAL_CAPSULE | Freq: Every day | ORAL | 0 refills | Status: DC
Start: 2021-11-10 — End: 2022-05-18

## 2021-11-10 MED ORDER — CARBAMAZEPINE 100 MG PO CHEW
100.0000 mg | CHEWABLE_TABLET | Freq: Three times a day (TID) | ORAL | 0 refills | Status: DC
Start: 2021-11-10 — End: 2022-05-18

## 2021-11-10 MED ORDER — LORAZEPAM 1 MG PO TABS: 0.0000 mg | ORAL_TABLET | Freq: Two times a day (BID) | ORAL | Status: DC

## 2021-11-10 MED ORDER — THIAMINE HCL 100 MG/ML IJ SOLN: 100.0000 mg | Freq: Every day | INTRAMUSCULAR | Status: DC

## 2021-11-10 MED ORDER — LORAZEPAM 1 MG PO TABS
0.0000 mg | ORAL_TABLET | Freq: Four times a day (QID) | ORAL | Status: DC
Start: 2021-11-10 — End: 2021-11-10
  Administered 2021-11-10: 1 mg via ORAL
  Filled 2021-11-10: qty 1

## 2021-11-10 MED ORDER — ALUM & MAG HYDROXIDE-SIMETH 200-200-20 MG/5ML PO SUSP: 30.0000 mL | ORAL | Status: DC | PRN

## 2021-11-10 MED ORDER — TRAZODONE HCL 100 MG PO TABS: 100.0000 mg | ORAL_TABLET | Freq: Every evening | ORAL | 0 refills | Status: DC | PRN

## 2021-11-10 MED ORDER — HYDROXYZINE HCL 50 MG PO TABS: 50.0000 mg | ORAL_TABLET | Freq: Three times a day (TID) | ORAL | 0 refills | Status: DC | PRN

## 2021-11-10 MED ORDER — DICYCLOMINE HCL 10 MG PO CAPS
10.0000 mg | ORAL_CAPSULE | Freq: Three times a day (TID) | ORAL | 0 refills | Status: DC
Start: 2021-11-10 — End: 2022-05-18

## 2021-11-10 MED ORDER — GABAPENTIN 300 MG PO CAPS
300.0000 mg | ORAL_CAPSULE | Freq: Three times a day (TID) | ORAL | Status: DC
  Administered 2021-11-10: 300 mg via ORAL
  Filled 2021-11-10: qty 1

## 2021-11-10 MED ORDER — PANTOPRAZOLE SODIUM 40 MG PO TBEC: 40.0000 mg | DELAYED_RELEASE_TABLET | Freq: Every day | ORAL | 0 refills | Status: DC

## 2021-11-10 MED ORDER — MAGNESIUM HYDROXIDE 400 MG/5ML PO SUSP: 30.0000 mL | Freq: Every day | ORAL | Status: DC | PRN

## 2021-11-10 MED ORDER — TRAZODONE HCL 100 MG PO TABS: 100.0000 mg | ORAL_TABLET | Freq: Every evening | ORAL | Status: DC | PRN

## 2021-11-10 MED ORDER — GABAPENTIN 300 MG PO CAPS
300.0000 mg | ORAL_CAPSULE | Freq: Three times a day (TID) | ORAL | 0 refills | Status: DC
Start: 2021-11-10 — End: 2022-05-18

## 2021-11-10 MED ORDER — HYDROXYZINE HCL 25 MG PO TABS: 50.0000 mg | ORAL_TABLET | Freq: Three times a day (TID) | ORAL | Status: DC | PRN

## 2021-11-10 MED ORDER — DICYCLOMINE HCL 10 MG PO CAPS
10.0000 mg | ORAL_CAPSULE | Freq: Three times a day (TID) | ORAL | Status: DC
  Administered 2021-11-10: 10 mg via ORAL
  Filled 2021-11-10: qty 1

## 2021-11-10 MED ORDER — LORAZEPAM 2 MG/ML IJ SOLN: 0.0000 mg | Freq: Two times a day (BID) | INTRAMUSCULAR | Status: DC

## 2021-11-10 MED ORDER — DULOXETINE HCL 20 MG PO CPEP
40.0000 mg | ORAL_CAPSULE | Freq: Every day | ORAL | Status: DC
  Administered 2021-11-10: 40 mg via ORAL
  Filled 2021-11-10: qty 2
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
## 2021-11-10 NOTE — ED Provider Notes (Signed)
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 with  improper specimen collection/handling, submission of specimen other than nasopharyngeal swab, presence of viral mutation(s) within the areas  targeted by this assay, and inadequate number of viral copies(<138 copies/mL). A negative result must be combined with clinical observations, patient history, and epidemiological information. The expected result is Negative.  Fact Sheet for Patients:  BloggerCourse.com  Fact Sheet for Healthcare Providers:  SeriousBroker.it  This test is no                          t yet approved or cleared by the [place] FDA and  has been authorized for detection and/or diagnosis of SARS-CoV-2 by FDA under an Emergency Use Authorization (EUA). This EUA will remain  in effect (meaning this test can be used) for the duration of the COVID-19 declaration under Section 564(b)(1) of the Act, 21 U.S.C.section 360bbb-3(b)(1), unless the authorization is terminated  or revoked sooner.       Influenza A by PCR 10/24/2021 NEGATIVE  NEGATIVE Final   Influenza B by PCR 10/24/2021 NEGATIVE  NEGATIVE Final   Comment: (NOTE) The Xpert Xpress SARS-CoV-2/FLU/RSV plus assay is intended as an aid in the diagnosis of influenza from Nasopharyngeal swab specimens and should not be used as a sole basis for treatment. Nasal washings and aspirates are unacceptable for Xpert Xpress SARS-CoV-2/FLU/RSV testing.  Fact Sheet for Patients: BloggerCourse.com  Fact Sheet for Healthcare Providers: SeriousBroker.it  This test is not yet approved or cleared by the [place] FDA and has been authorized for detection and/or diagnosis of SARS-CoV-2 by FDA under an Emergency Use Authorization (EUA). This EUA will remain in effect (meaning this test can be used) for the duration of the COVID-19 declaration under Section 564(b)(1) of the Act, 21 U.S.C. section 360bbb-3(b)(1), unless the authorization is terminated or revoked.  Performed at [facility], [address] [place], [place] [address]    Admission on 06/06/2021, Discharged on 06/11/2021  Component Date Value Ref Range Status   Carbamazepine Lvl 06/09/2021 3.5 (L)  4.0 - 12.0 ug/mL Final   Performed at [facility], [address] [place], [place] [address]   WBC 06/09/2021 6.5  4.0 - 10.5 K/uL Final   RBC 06/09/2021 4.53  4.22 - 5.81 MIL/uL Final   Hemoglobin 06/09/2021 15.3  13.0 - 17.0 g/dL Final   HCT 45/99/7741 45.9  39.0 - 52.0 % Final   MCV 06/09/2021 101.3 (H)  80.0 - 100.0 fL Final   MCH 06/09/2021 33.8  26.0 - 34.0 pg Final   MCHC 06/09/2021 33.3  30.0 - 36.0 g/dL Final   RDW 42/39/5320 12.5  11.5 - 15.5 % Final   Platelets 06/09/2021 360  150 - 400 K/uL Final   nRBC 06/09/2021 0.0  0.0 - 0.2 % Final   Neutrophils Relative % 06/09/2021 40  % Final   Neutro Abs 06/09/2021 2.6  1.7 - 7.7 K/uL Final   Lymphocytes Relative 06/09/2021 33  % Final   Lymphs Abs 06/09/2021 2.2  0.7 - 4.0 K/uL Final   Monocytes Relative 06/09/2021 13  % Final   Monocytes Absolute 06/09/2021 0.8  0.1 - 1.0 K/uL Final   Eosinophils Relative 06/09/2021 12  % Final   Eosinophils Absolute 06/09/2021 0.8 (H)  0.0 - 0.5 K/uL Final   Basophils Relative 06/09/2021 2  % Final   Basophils Absolute 06/09/2021 0.1  0.0 - 0.1 K/uL  Final   Immature Granulocytes 06/09/2021 0  % Final   Abs Immature Granulocytes 06/09/2021 0.02  0.00 - 0.07 K/uL Final   Performed at [facility], [place] [address] [place], [place] [address]   Sodium 06/09/2021 141  135 - 145 mmol/L Final   Potassium 06/09/2021 4.5  3.5 - 5.1 mmol/L Final   Chloride 06/09/2021 105  98 - 111 mmol/L Final   CO2 06/09/2021 28  22 - 32 mmol/L Final   Glucose, Bld 06/09/2021 95  70 - 99 mg/dL Final   Glucose reference range applies only to samples taken after fasting for at least 8 hours.   BUN 06/09/2021 12  6 - 20 mg/dL Final   Creatinine, Ser 06/09/2021 0.83  0.61 - 1.24 mg/dL Final   Calcium 06/09/2021 10.2  8.9 - 10.3 mg/dL Final   Total Protein 06/09/2021  7.9  6.5 - 8.1 g/dL Final   Albumin 06/09/2021 4.4  3.5 - 5.0 g/dL Final   AST 06/09/2021 17  15 - 41 U/L Final   ALT 06/09/2021 16  0 - 44 U/L Final   Alkaline Phosphatase 06/09/2021 44  38 - 126 U/L Final   Total Bilirubin 06/09/2021 0.5  0.3 - 1.2 mg/dL Final   GFR, Estimated 06/09/2021 >60  >60 mL/min Final   Comment: (NOTE) Calculated using the CKD-EPI Creatinine Equation (2021)    Anion gap 06/09/2021 8  5 - 15 Final   Performed at [facility], [place] [address], [place], [place] [address]  Admission on 06/05/2021, Discharged on 06/06/2021  Component Date Value Ref Range Status   Sodium 06/05/2021 143  135 - 145 mmol/L Final   Potassium 06/05/2021 3.6  3.5 - 5.1 mmol/L Final   Chloride 06/05/2021 111  98 - 111 mmol/L Final   CO2 06/05/2021 19 (L)  22 - 32 mmol/L Final   Glucose, Bld 06/05/2021 94  70 - 99 mg/dL Final   Glucose reference range applies only to samples taken after fasting for at least 8 hours.   BUN 06/05/2021 6  6 - 20 mg/dL Final   Creatinine, Ser 06/05/2021 0.86  0.61 - 1.24 mg/dL Final   Calcium 06/05/2021 9.1  8.9 - 10.3 mg/dL Final   Total Protein 06/05/2021 7.9  6.5 - 8.1 g/dL Final   Albumin 06/05/2021 4.2  3.5 - 5.0 g/dL Final   AST 06/05/2021 22  15 - 41 U/L Final   ALT 06/05/2021 17  0 - 44 U/L Final   Alkaline Phosphatase 06/05/2021 44  38 - 126 U/L Final   Total Bilirubin 06/05/2021 0.5  0.3 - 1.2 mg/dL Final   GFR, Estimated 06/05/2021 >60  >60 mL/min Final   Comment: (NOTE) Calculated using the CKD-EPI Creatinine Equation (2021)    Anion gap 06/05/2021 13  5 - 15 Final   Performed at [facility], [place] [address] [place], [place] [address]   Alcohol, Ethyl (B) 06/05/2021 261 (H)  <10 mg/dL Final   Comment: (NOTE) Lowest detectable limit for serum alcohol is 10 mg/dL.  For medical purposes only. Performed at [facility], [place] [address] [place], [place] [address]    WBC 06/05/2021 6.6   4.0 - 10.5 K/uL Final   RBC 06/05/2021 4.14 (L)  4.22 - 5.81 MIL/uL Final   Hemoglobin 06/05/2021 14.3  13.0 - 17.0 g/dL Final   HCT 06/05/2021 40.9  39.0 - 52.0 % Final   MCV 06/05/2021 98.8  80.0 -  100.0 fL Final   MCH 06/05/2021 34.5 (H)  26.0 - 34.0 pg Final   MCHC 06/05/2021 35.0  30.0 - 36.0 g/dL Final   RDW 06/05/2021 12.4  11.5 - 15.5 % Final   Platelets 06/05/2021 354  150 - 400 K/uL Final   nRBC 06/05/2021 0.0  0.0 - 0.2 % Final   Neutrophils Relative % 06/05/2021 49  % Final   Neutro Abs 06/05/2021 3.2  1.7 - 7.7 K/uL Final   Lymphocytes Relative 06/05/2021 22  % Final   Lymphs Abs 06/05/2021 1.4  0.7 - 4.0 K/uL Final   Monocytes Relative 06/05/2021 12  % Final   Monocytes Absolute 06/05/2021 0.8  0.1 - 1.0 K/uL Final   Eosinophils Relative 06/05/2021 15  % Final   Eosinophils Absolute 06/05/2021 1.0 (H)  0.0 - 0.5 K/uL Final   Basophils Relative 06/05/2021 2  % Final   Basophils Absolute 06/05/2021 0.1  0.0 - 0.1 K/uL Final   Immature Granulocytes 06/05/2021 0  % Final   Abs Immature Granulocytes 06/05/2021 0.02  0.00 - 0.07 K/uL Final   Performed at [facility], [place] [address] [place], [place] [address]   Opiates 06/05/2021 NONE DETECTED  NONE DETECTED Final   Cocaine 06/05/2021 POSITIVE (A)  NONE DETECTED Final   Benzodiazepines 06/05/2021 POSITIVE (A)  NONE DETECTED Final   Amphetamines 06/05/2021 NONE DETECTED  NONE DETECTED Final   Tetrahydrocannabinol 06/05/2021 NONE DETECTED  NONE DETECTED Final   Barbiturates 06/05/2021 NONE DETECTED  NONE DETECTED Final   Comment: (NOTE) DRUG SCREEN FOR MEDICAL PURPOSES ONLY.  IF CONFIRMATION IS NEEDED FOR ANY PURPOSE, NOTIFY LAB WITHIN 5 DAYS.  LOWEST DETECTABLE LIMITS FOR URINE DRUG SCREEN Drug Class                     Cutoff (ng/mL) Amphetamine and metabolites    1000 Barbiturate and metabolites    200 Benzodiazepine                 [id number] Tricyclics and metabolites     300 Opiates and metabolites         300 Cocaine and metabolites        300 THC                            50 Performed at [facility], [place] [address] [place], [place] [address]    SARS Coronavirus 2 by RT PCR 06/05/2021 NEGATIVE  NEGATIVE Final   Comment: (NOTE) SARS-CoV-2 target nucleic acids are NOT DETECTED.  The SARS-CoV-2 RNA is generally detectable in upper respiratory specimens during the acute phase of infection. The lowest concentration of SARS-CoV-2 viral copies this assay can detect is 138 copies/mL. A negative result does not preclude SARS-Cov-2 infection and should not be used as the sole basis for treatment or other patient management decisions. A negative result may occur with  improper specimen collection/handling, submission of specimen other than nasopharyngeal swab, presence of viral mutation(s) within the areas targeted by this assay, and inadequate number of viral copies(<138 copies/mL). A negative result must be combined with clinical observations, patient history, and epidemiological information. The expected result is Negative.  Fact Sheet for Patients:  EntrepreneurPulse.com.au  Fact Sheet for Healthcare Providers:  IncredibleEmployment.be  This test is no  t yet approved or cleared by the [place] and  has been authorized for detection and/or diagnosis of SARS-CoV-2 by FDA under an Emergency Use Authorization (EUA). This EUA will remain  in effect (meaning this test can be used) for the duration of the COVID-19 declaration under Section 564(b)(1) of the Act, 21 U.S.C.section 360bbb-3(b)(1), unless the authorization is terminated  or revoked sooner.       Influenza A by PCR 06/05/2021 NEGATIVE  NEGATIVE Final   Influenza B by PCR 06/05/2021 NEGATIVE  NEGATIVE Final   Comment: (NOTE) The Xpert Xpress SARS-CoV-2/FLU/RSV plus assay is intended as an aid in the diagnosis of influenza from  Nasopharyngeal swab specimens and should not be used as a sole basis for treatment. Nasal washings and aspirates are unacceptable for Xpert Xpress SARS-CoV-2/FLU/RSV testing.  Fact Sheet for Patients: EntrepreneurPulse.com.au  Fact Sheet for Healthcare Providers: IncredibleEmployment.be  This test is not yet approved or cleared by the [place] FDA and has been authorized for detection and/or diagnosis of SARS-CoV-2 by FDA under an Emergency Use Authorization (EUA). This EUA will remain in effect (meaning this test can be used) for the duration of the COVID-19 declaration under Section 564(b)(1) of the Act, 21 U.S.C. section 360bbb-3(b)(1), unless the authorization is terminated or revoked.  Performed at [facility], [place] [address] [place], [place] [address]   Admission on 05/24/2021, Discharged on 05/30/2021  Component Date Value Ref Range Status   TSH 05/25/2021 1.685  0.350 - 4.500 uIU/mL Final   Comment: Performed by a 3rd Generation assay with a functional sensitivity of <=0.01 uIU/mL. Performed at [facility], [place] [address] [place], [place] [address]    Carbamazepine Lvl 05/29/2021 7.3  4.0 - 12.0 ug/mL Final   Performed at [place] [address] [place], [place] [address]   WBC 05/29/2021 7.3  4.0 - 10.5 K/uL Final   RBC 05/29/2021 4.23  4.22 - 5.81 MIL/uL Final   Hemoglobin 05/29/2021 14.9  13.0 - 17.0 g/dL Final   HCT 05/29/2021 44.1  39.0 - 52.0 % Final   MCV 05/29/2021 104.3 (H)  80.0 - 100.0 fL Final   MCH 05/29/2021 35.2 (H)  26.0 - 34.0 pg Final   MCHC 05/29/2021 33.8  30.0 - 36.0 g/dL Final   RDW 05/29/2021 12.8  11.5 - 15.5 % Final   Platelets 05/29/2021 247  150 - 400 K/uL Final   nRBC 05/29/2021 0.0  0.0 - 0.2 % Final   Neutrophils Relative % 05/29/2021 55  % Final   Neutro Abs 05/29/2021 4.0  1.7 - 7.7 K/uL Final   Lymphocytes Relative 05/29/2021 22  % Final    Lymphs Abs 05/29/2021 1.6  0.7 - 4.0 K/uL Final   Monocytes Relative 05/29/2021 15  % Final   Monocytes Absolute 05/29/2021 1.1 (H)  0.1 - 1.0 K/uL Final   Eosinophils Relative 05/29/2021 6  % Final   Eosinophils Absolute 05/29/2021 0.4  0.0 - 0.5 K/uL Final   Basophils Relative 05/29/2021 2  % Final   Basophils Absolute 05/29/2021 0.1  0.0 - 0.1 K/uL Final   Immature Granulocytes 05/29/2021 0  % Final   Abs Immature Granulocytes 05/29/2021 0.02  0.00 - 0.07 K/uL Final   Performed at [facility], [place] [address] [place], [place] [address]   Sodium 05/29/2021 138  135 - 145 mmol/L Final   Potassium 05/29/2021 4.2  3.5 - 5.1 mmol/L Final   Chloride  05/29/2021 102  98 - 111 mmol/L Final   CO2 05/29/2021 28  22 - 32 mmol/L Final   Glucose, Bld 05/29/2021 85  70 - 99 mg/dL Final   Glucose reference range applies only to samples taken after fasting for at least 8 hours.   BUN 05/29/2021 14  6 - 20 mg/dL Final   Creatinine, Ser 05/29/2021 1.17  0.61 - 1.24 mg/dL Final   Calcium 05/29/2021 9.8  8.9 - 10.3 mg/dL Final   Total Protein 05/29/2021 8.0  6.5 - 8.1 g/dL Final   Albumin 05/29/2021 4.5  3.5 - 5.0 g/dL Final   AST 05/29/2021 20  15 - 41 U/L Final   ALT 05/29/2021 15  0 - 44 U/L Final   Alkaline Phosphatase 05/29/2021 44  38 - 126 U/L Final   Total Bilirubin 05/29/2021 0.3  0.3 - 1.2 mg/dL Final   GFR, Estimated 05/29/2021 >60  >60 mL/min Final   Comment: (NOTE) Calculated using the CKD-EPI Creatinine Equation (2021)    Anion gap 05/29/2021 8  5 - 15 Final   Performed at [facility], [place] [address] [place], [place] [address]  Admission on 05/24/2021, Discharged on 05/24/2021  Component Date Value Ref Range Status   Sodium 05/24/2021 137  135 - 145 mmol/L Final   Potassium 05/24/2021 4.1  3.5 - 5.1 mmol/L Final   Chloride 05/24/2021 104  98 - 111 mmol/L Final   CO2 05/24/2021 18 (L)  22 - 32 mmol/L Final   Glucose, Bld 05/24/2021 100 (H)  70  - 99 mg/dL Final   Glucose reference range applies only to samples taken after fasting for at least 8 hours.   BUN 05/24/2021 5 (L)  6 - 20 mg/dL Final   Creatinine, Ser 05/24/2021 0.97  0.61 - 1.24 mg/dL Final   Calcium 05/24/2021 9.3  8.9 - 10.3 mg/dL Final   Total Protein 05/24/2021 8.2 (H)  6.5 - 8.1 g/dL Final   Albumin 05/24/2021 4.4  3.5 - 5.0 g/dL Final   AST 05/24/2021 24  15 - 41 U/L Final   ALT 05/24/2021 14  0 - 44 U/L Final   Alkaline Phosphatase 05/24/2021 66  38 - 126 U/L Final   Total Bilirubin 05/24/2021 0.5  0.3 - 1.2 mg/dL Final   GFR, Estimated 05/24/2021 >60  >60 mL/min Final   Comment: (NOTE) Calculated using the CKD-EPI Creatinine Equation (2021)    Anion gap 05/24/2021 15  5 - 15 Final   Performed at [place] [address], [place], [place] [address]   Alcohol, Ethyl (B) 05/24/2021 279 (H)  <10 mg/dL Final   Comment: (NOTE) Lowest detectable limit for serum alcohol is 10 mg/dL.  For medical purposes only. Performed at [facility], [place] [address], [place], [place] [id number]    Salicylate Lvl 123XX123 <7.0 (L)  7.0 - 30.0 mg/dL Final   Performed at [place] [address], [place], [place] [address]   Acetaminophen (Tylenol), Serum 05/24/2021 <10 (L)  10 - 30 ug/mL Final   Comment: (NOTE) Therapeutic concentrations vary significantly. A range of 10-30 ug/mL  may be an effective concentration for many patients. However, some  are best treated at concentrations outside of this range. Acetaminophen concentrations >150 ug/mL at 4 hours after ingestion  and >50 ug/mL at 12 hours after ingestion are often associated with  toxic reactions.  Performed at [facility], [place] [address], [place], [place]  [address]    WBC 05/24/2021 7.5  4.0 - 10.5 K/uL Final   RBC 05/24/2021 4.67  4.22 - 5.81 MIL/uL Final   Hemoglobin 05/24/2021 16.2  13.0 - 17.0 g/dL Final   HCT 05/24/2021 46.7  39.0 - 52.0 % Final   MCV 05/24/2021 100.0   80.0 - 100.0 fL Final   MCH 05/24/2021 34.7 (H)  26.0 - 34.0 pg Final   MCHC 05/24/2021 34.7  30.0 - 36.0 g/dL Final   RDW 05/24/2021 13.0  11.5 - 15.5 % Final   Platelets 05/24/2021 354  150 - 400 K/uL Final   nRBC 05/24/2021 0.0  0.0 - 0.2 % Final   Performed at [facility], [place] [address] [place], [place] [address]   Opiates 05/24/2021 NONE DETECTED  NONE DETECTED Final   Cocaine 05/24/2021 POSITIVE (A)  NONE DETECTED Final   Benzodiazepines 05/24/2021 NONE DETECTED  NONE DETECTED Final   Amphetamines 05/24/2021 NONE DETECTED  NONE DETECTED Final   Tetrahydrocannabinol 05/24/2021 NONE DETECTED  NONE DETECTED Final   Barbiturates 05/24/2021 NONE DETECTED  NONE DETECTED Final   Comment: (NOTE) DRUG SCREEN FOR MEDICAL PURPOSES ONLY.  IF CONFIRMATION IS NEEDED FOR ANY PURPOSE, NOTIFY LAB WITHIN 5 DAYS.  LOWEST DETECTABLE LIMITS FOR URINE DRUG SCREEN Drug Class                     Cutoff (ng/mL) Amphetamine and metabolites    1000 Barbiturate and metabolites    200 Benzodiazepine                 [id number] Tricyclics and metabolites     300 Opiates and metabolites        300 Cocaine and metabolites        300 THC                            50 Performed at [facility], [place] [address] [place], [place] [address]    Sed Rate 05/24/2021 2  0 - 16 mm/hr Final   Performed at [place] [address] [place], [place] [address]   CRP 05/24/2021 1.0 (H)  <1.0 mg/dL Final   Performed at [place] [address], [place], [place] [address]   SARS Coronavirus 2 by RT PCR 05/24/2021 NEGATIVE  NEGATIVE Final   Comment: (NOTE) SARS-CoV-2 target nucleic acids are NOT DETECTED.  The SARS-CoV-2 RNA is generally detectable in upper respiratory specimens during the acute phase of infection. The lowest concentration of SARS-CoV-2 viral copies this assay can detect is 138 copies/mL. A negative result does not preclude SARS-Cov-2 infection and should not be used as the  sole basis for treatment or other patient management decisions. A negative result may occur with  improper specimen collection/handling, submission of specimen other than nasopharyngeal swab, presence of viral mutation(s) within the areas targeted by this assay, and inadequate number of viral copies(<138 copies/mL). A negative result must be combined with clinical observations, patient history, and epidemiological information. The expected result is Negative.  Fact Sheet for Patients:  EntrepreneurPulse.com.au  Fact Sheet for Healthcare Providers:  IncredibleEmployment.be  This test is no                          t yet approved or cleared by the [place] FDA and  has been authorized for detection and/or diagnosis of  SARS-CoV-2 by FDA under an Emergency Use Authorization (EUA). This EUA will remain  in effect (meaning this test can be used) for the duration of the COVID-19 declaration under Section 564(b)(1) of the Act, 21 U.S.C.section 360bbb-3(b)(1), unless the authorization is terminated  or revoked sooner.       Influenza A by PCR 05/24/2021 NEGATIVE  NEGATIVE Final   Influenza B by PCR 05/24/2021 NEGATIVE  NEGATIVE Final   Comment: (NOTE) The Xpert Xpress SARS-CoV-2/FLU/RSV plus assay is intended as an aid in the diagnosis of influenza from Nasopharyngeal swab specimens and should not be used as a sole basis for treatment. Nasal washings and aspirates are unacceptable for Xpert Xpress SARS-CoV-2/FLU/RSV testing.  Fact Sheet for Patients: EntrepreneurPulse.com.au  Fact Sheet for Healthcare Providers: IncredibleEmployment.be  This test is not yet approved or cleared by the [place] FDA and has been authorized for detection and/or diagnosis of SARS-CoV-2 by FDA under an Emergency Use Authorization (EUA). This EUA will remain in effect (meaning this test can be used) for the duration of  the COVID-19 declaration under Section 564(b)(1) of the Act, 21 U.S.C. section 360bbb-3(b)(1), unless the authorization is terminated or revoked.  Performed at [facility], [place] [address], [place], [place] [address]    Troponin I (High Sensitivity) 05/24/2021 <2  <18 ng/L Final   Comment: (NOTE) Elevated high sensitivity troponin I (hsTnI) values and significant  changes across serial measurements may suggest ACS but many other  chronic and acute conditions are known to elevate hsTnI results.  Refer to the "Links" section for chest pain algorithms and additional  guidance. Performed at [facility], [place] [address] [place], [place] [address]    Troponin I (High Sensitivity) 05/24/2021 <2  <18 ng/L Final   Comment: (NOTE) Elevated high sensitivity troponin I (hsTnI) values and significant  changes across serial measurements may suggest ACS but many other  chronic and acute conditions are known to elevate hsTnI results.  Refer to the "Links" section for chest pain algorithms and additional  guidance. Performed at [facility], [place] [address], [place], [place] [address]     Allergies: Patient has no known allergies.  PTA Medications:  Current Outpatient Medications  Medication Instructions   carbamazepine (TEGRETOL) 100 mg, Oral, 3 times daily   dicyclomine (BENTYL) 10 mg, Oral, 3 times daily before meals & bedtime   DULoxetine HCl 40 mg, Oral, Daily   gabapentin (NEURONTIN) 300 mg, Oral, 3 times daily   hydrOXYzine (ATARAX/VISTARIL) 50 mg, Oral, 3 times daily PRN   nicotine (NICODERM CQ - DOSED IN MG/24 HOURS) 21 mg, Transdermal, Daily   pantoprazole (PROTONIX) 40 mg, Oral, Daily   traZODone (DESYREL) 100 mg, Oral, At bedtime PRN     Medical Decision Making  Admit to continuous assessment for crisis stabilization.   Patient was medically cleared in the ED.   Patient reports that he has not had alcohol in greater than 7 days and immediatly prior to  that he was inpatient at [facility] from 10/24/2021-11/02/2021. At this point, the risk of acute alcohol withdrawal symptoms is minimal.   Patient has not taken medications in greater than a week. Do not feel it is necessary to check a carbamazepine level at this time.   Restart PTA medications Meds ordered this encounter  Medications   carbamazepine (TEGRETOL) chewable tablet 100 mg   DULoxetine (CYMBALTA) DR capsule 40 mg   gabapentin (NEURONTIN) capsule 300 mg   pantoprazole (PROTONIX) EC tablet  40 mg   traZODone (DESYREL) tablet 100 mg   nicotine (NICODERM CQ - dosed in mg/24 hours) patch 21 mg   dicyclomine (BENTYL) capsule 10 mg   hydrOXYzine (ATARAX/VISTARIL) tablet 50 mg   acetaminophen (TYLENOL) tablet 650 mg   alum & mag hydroxide-simeth (MAALOX/MYLANTA) 200-200-20 MG/5ML suspension 30 mL   magnesium hydroxide (MILK OF MAGNESIA) suspension 30 mL        Recommendations  Based on my evaluation the patient does not appear to have an emergency medical condition.  Rozetta Nunnery, NP 11/10/21  4:49 AM
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
## 2021-11-10 NOTE — ED Notes (Signed)
Presents with suicidal ideations,m depression and anxiety.  Pt reports he feels like he is a burden to everyone.  A&O x 4, calm & cooperative, no distress noted.  Monitoring for safety.

## 2021-11-10 NOTE — ED Notes (Signed)
Report given to nurse at [facility].

## 2021-11-10 NOTE — ED Notes (Signed)
Pt discharged with  AVS.  AVS reviewed prior to discharge.  Pt alert, oriented, and ambulatory.  Safety maintained.  °

## 2021-11-10 NOTE — Discharge Instructions (Signed)

## 2021-11-10 NOTE — ED Notes (Signed)
Pt resting quietly in recliner bed.  Voiced he had right sided rib pain due to a fall he had prior to coming to [facility].  Pt reports pain 4/10, PRN medication provided.  Denies SI and HI at this time.  Denies AVH at this time.  Breathing is even and unlabored.  Will continue to monitor for safety.

## 2021-11-10 NOTE — ED Provider Notes (Signed)
[facility]EMERGENCY [facility]PT Provider Note   CSN: [id number] Arrival date & time: 11/10/21  0000     History Chief Complaint  Patient presents with   Suicidal   Depression    John Vaughan is a 30 y.o. male.  Patient presents to the emergency department with depression and suicidal thoughts.  He states that this is an ongoing issue for him.  He states that he is having hard time because both his grandmother and his father are dead and their birthdays are this month.  He reports (per RN) using alcohol and crack 2 weeks ago, but he denies drug or alcohol use to me.  He states that he does have suicidal thoughts at times.  He feels like his medications are not working and he needs additional help now.  Denies recent illnesses.  The history is provided by the patient. No language interpreter was used.      Past Medical History:  Diagnosis Date   Alcohol abuse    Anxiety    Depression     Patient Active Problem List   Diagnosis Date Noted   Major depressive disorder, recurrent episode, severe (HCC) 10/25/2021   PTSD (post-traumatic stress disorder) 10/25/2021   MDD (major depressive disorder), recurrent episode, severe (HCC) 06/06/2021   Suicidal ideations 05/24/2021   MDD (major depressive disorder), recurrent severe, without psychosis (HCC) 05/24/2021   Depression 03/20/2021   Compression fracture of T7 vertebra (HCC) 03/05/2021   COVID-19 virus infection 03/05/2021   Malingering 12/12/2020   Alcohol use, unspecified with withdrawal delirium (HCC) 12/03/2020   Alcohol withdrawal (HCC) 09/06/2020   Chronic low back pain 07/14/2020   Personal history of scoliosis 07/14/2020   Cannabis use disorder, severe, dependence (HCC) 06/06/2020   Alcohol-induced mood disorder (HCC) 04/27/2020   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 No pertinent surgical history.     Family History  Problem Relation Age of Onset   Anxiety disorder Mother    Alcohol abuse Father    Anxiety disorder Maternal Aunt    Anxiety disorder Maternal Grandmother    Alcohol abuse Paternal Grandfather     Social History   Tobacco Use   Smoking status: Every Day    Packs/day: 1.50    Types: Cigarettes   Smokeless tobacco: Former    Types: Snuff  Vaping Use   Vaping Use: Never used  Substance Use Topics   Alcohol use: Yes    Alcohol/week: 2.0 standard drinks    Types: 2 Cans of beer per week    Comment: states that he drinks 2 beers daily   Drug use: Not Currently    Types: Marijuana, Cocaine    Comment: cocaine in last month    Home Medications Prior to Admission medications   Medication Sig Start Date End Date Taking? Authorizing Provider  carbamazepine (TEGRETOL) 100 MG chewable tablet Chew 1 tablet (100 mg total) by mouth 3 (three) times daily. 11/02/21   Carlyn Reichert, MD  dicyclomine (BENTYL) 10 MG  capsule Take 1 capsule (10 mg total) by mouth 4 (four) times daily -  before meals and at bedtime. Patient not taking: No sig reported 06/11/21   Sharma Covert, MD  DULoxetine 40 MG CPEP Take 40 mg by mouth daily. 11/02/21   Corky Sox, MD  gabapentin (NEURONTIN) 300 MG capsule Take 1 capsule (300 mg total) by mouth 3 (three) times daily. 11/02/21   Corky Sox, MD  hydrOXYzine (ATARAX/VISTARIL) 50 MG tablet Take 1 tablet (50 mg total) by mouth 3 (three) times daily as needed for  anxiety. Patient not taking: No sig reported 06/11/21   Sharma Covert, MD  nicotine (NICODERM CQ - DOSED IN MG/24 HOURS) 21 mg/24hr patch Place 1 patch (21 mg total) onto the skin daily. 11/02/21   Corky Sox, MD  pantoprazole (PROTONIX) 40 MG tablet Take 1 tablet (40 mg total) by mouth daily. Patient not taking: No sig reported 06/12/21   Sharma Covert, MD  traZODone (DESYREL) 100 MG tablet Take 1 tablet (100 mg total) by mouth at bedtime as needed for sleep. Patient not taking: No sig reported 06/11/21   Sharma Covert, MD    Allergies    Patient has no known allergies.  Review of Systems   Review of Systems  All other systems reviewed and are negative.  Physical Exam Updated Vital Signs BP 112/77   Pulse 84   Temp 98.1 F (36.7 C) (Oral)   Resp 14   SpO2 99%   Physical Exam Vitals and nursing note reviewed.  Constitutional:      General: He is not in acute distress.    Appearance: He is well-developed. He is not ill-appearing.  HENT:     Head: Normocephalic and atraumatic.  Eyes:     Conjunctiva/sclera: Conjunctivae normal.  Cardiovascular:     Rate and Rhythm: Normal rate.  Pulmonary:     Effort: Pulmonary effort is normal. No respiratory distress.  Abdominal:     General: There is no distension.  Musculoskeletal:     Cervical back: Neck supple.     Comments: Moves all extremities  Skin:    General: Skin is warm and dry.  Neurological:     Mental Status: He is alert and oriented to person, place, and time.  Psychiatric:        Mood and Affect: Mood normal.        Behavior: Behavior normal.    ED Results / Procedures / Treatments   Labs (all labs ordered are listed, but only abnormal results are displayed) Labs Reviewed  CBC WITH DIFFERENTIAL/PLATELET - Abnormal; Notable for the following components:      Result Value   RDW 11.0 (*)    Platelets 418 (*)    All other components within normal limits  COMPREHENSIVE METABOLIC PANEL -  Abnormal; Notable for the following components:   Total Protein 8.2 (*)    All other components within normal limits  ACETAMINOPHEN LEVEL - Abnormal; Notable for the following components:   Acetaminophen (Tylenol), Serum <10 (*)    All other components within normal limits  SALICYLATE LEVEL - Abnormal; Notable for the following components:   Salicylate Lvl [id number] (*)    All other components within normal limits  RESP PANEL BY RT-PCR (FLU A&B, COVID) ARPGX2  ETHANOL  RAPID URINE DRUG SCREEN, HOSP PERFORMED    EKG None  Radiology No results found.  Procedures Procedures   Medications Ordered in ED Medications  LORazepam (ATIVAN) injection 0-4  mg ( Intravenous See Alternative 11/10/21 0149)    Or  LORazepam (ATIVAN) tablet 0-4 mg (1 mg Oral Given 11/10/21 0149)  LORazepam (ATIVAN) injection 0-4 mg (has no administration in time range)    Or  LORazepam (ATIVAN) tablet 0-4 mg (has no administration in time range)  thiamine tablet 100 mg (has no administration in time range)    Or  thiamine (B-1) injection 100 mg (has no administration in time range)    ED Course  I have reviewed the triage vital signs and the nursing notes.  Pertinent labs & imaging results that were available during my care of the patient were reviewed by me and considered in my medical decision making (see chart for details).    MDM Rules/Calculators/A&P                           Patient here for evaluation of depressed and suicidal thoughts.  Vital signs are stable.  He is in no acute distress.  Recommendation is for patient to be transferred to behavioral health urgent care. Final Clinical Impression(s) / ED Diagnoses Final diagnoses:  Suicidal ideation    Rx / DC Orders ED Discharge Orders     None        Montine Circle, PA-C 11/10/21 0157    Orpah Greek, MD 11/10/21 [phone]

## 2021-11-10 NOTE — ED Triage Notes (Signed)
Pt reports with depression and suicidal thoughts. This has been an ongoing issue per pt. He states that this month is hard for him because his dad and grandma are dead and their birthdays are this month. Pt reports doing alcohol along with crack 2 weeks ago.

## 2021-11-14 ENCOUNTER — Telehealth (HOSPITAL_COMMUNITY): Payer: Self-pay

## 2021-11-14 NOTE — BH Assessment (Signed)
Care Management - Follow Up Discharges   Writer attempted to make contact with patient today and was unsuccessful.  Writer was informed that this is no longer the patent phone number.    Per chart review, patient was provided with substance abuse and mental health resources.

## 2021-11-25 ENCOUNTER — Emergency Department (HOSPITAL_COMMUNITY)
Admission: EM | Admit: 2021-11-25 | Discharge: 2021-11-25 | Disposition: A | Discharge status: Other Acute Inpatient Hospital | Payer: Self-pay | Attending: Emergency Medicine | Admitting: Emergency Medicine

## 2021-11-25 ENCOUNTER — Other Ambulatory Visit: Payer: Self-pay

## 2021-11-25 ENCOUNTER — Encounter (HOSPITAL_COMMUNITY): Payer: Self-pay

## 2021-11-25 DIAGNOSIS — Z20822 Contact with and (suspected) exposure to covid-19: Secondary | ICD-10-CM | POA: Insufficient documentation

## 2021-11-25 DIAGNOSIS — F1010 Alcohol abuse, uncomplicated: Secondary | ICD-10-CM

## 2021-11-25 DIAGNOSIS — F17210 Nicotine dependence, cigarettes, uncomplicated: Secondary | ICD-10-CM | POA: Insufficient documentation

## 2021-11-25 DIAGNOSIS — Z8616 Personal history of COVID-19: Secondary | ICD-10-CM | POA: Insufficient documentation

## 2021-11-25 DIAGNOSIS — Z7289 Other problems related to lifestyle: Secondary | ICD-10-CM

## 2021-11-25 DIAGNOSIS — X781XXA Intentional self-harm by knife, initial encounter: Secondary | ICD-10-CM | POA: Insufficient documentation

## 2021-11-25 DIAGNOSIS — Z79899 Other long term (current) drug therapy: Secondary | ICD-10-CM | POA: Insufficient documentation

## 2021-11-25 DIAGNOSIS — F332 Major depressive disorder, recurrent severe without psychotic features: Secondary | ICD-10-CM | POA: Insufficient documentation

## 2021-11-25 DIAGNOSIS — R45851 Suicidal ideations: Secondary | ICD-10-CM

## 2021-11-25 DIAGNOSIS — Y908 Blood alcohol level of 240 mg/100 ml or more: Secondary | ICD-10-CM | POA: Insufficient documentation

## 2021-11-25 DIAGNOSIS — S51802A Unspecified open wound of left forearm, initial encounter: Secondary | ICD-10-CM | POA: Insufficient documentation

## 2021-11-25 DIAGNOSIS — F1020 Alcohol dependence, uncomplicated: Secondary | ICD-10-CM | POA: Insufficient documentation

## 2021-11-25 HISTORY — DX: Bipolar II disorder: F31.81

## 2021-11-25 LAB — CBC
HCT: 42.9 % (ref 39.0–52.0)
Hemoglobin: 14.9 g/dL (ref 13.0–17.0)
MCH: 33.7 pg (ref 26.0–34.0)
MCHC: 34.7 g/dL (ref 30.0–36.0)
MCV: 97.1 fL (ref 80.0–100.0)
Platelets: 274 10*3/uL (ref 150–400)
RBC: 4.42 MIL/uL (ref 4.22–5.81)
RDW: 11.6 % (ref 11.5–15.5)
WBC: 5.5 10*3/uL (ref 4.0–10.5)
nRBC: 0 % (ref 0.0–0.2)

## 2021-11-25 LAB — RAPID URINE DRUG SCREEN, HOSP PERFORMED
Amphetamines: NOT DETECTED
Barbiturates: NOT DETECTED
Benzodiazepines: NOT DETECTED
Cocaine: NOT DETECTED
Opiates: NOT DETECTED
Tetrahydrocannabinol: NOT DETECTED

## 2021-11-25 LAB — COMPREHENSIVE METABOLIC PANEL
ALT: 17 U/L (ref 0–44)
AST: 23 U/L (ref 15–41)
Albumin: 4.5 g/dL (ref 3.5–5.0)
Alkaline Phosphatase: 65 U/L (ref 38–126)
Anion gap: 10 (ref 5–15)
BUN: 11 mg/dL (ref 6–20)
CO2: 21 mmol/L — ABNORMAL LOW (ref 22–32)
Calcium: 9.1 mg/dL (ref 8.9–10.3)
Chloride: 107 mmol/L (ref 98–111)
Creatinine, Ser: 0.77 mg/dL (ref 0.61–1.24)
GFR, Estimated: >60 mL/min (ref >60)
Glucose, Bld: 104 mg/dL — ABNORMAL HIGH (ref 70–99)
Potassium: 3.8 mmol/L (ref 3.5–5.1)
Sodium: 138 mmol/L (ref 135–145)
Total Bilirubin: 0.6 mg/dL (ref 0.3–1.2)
Total Protein: 8.4 g/dL — ABNORMAL HIGH (ref 6.5–8.1)

## 2021-11-25 LAB — RESP PANEL BY RT-PCR (FLU A&B, COVID) ARPGX2
Influenza A by PCR: NEGATIVE
Influenza B by PCR: NEGATIVE
SARS Coronavirus 2 by RT PCR: NEGATIVE

## 2021-11-25 LAB — ETHANOL: Alcohol, Ethyl (B): 364 mg/dL (ref <10)

## 2021-11-25 MED ORDER — LORAZEPAM 1 MG PO TABS: 0.0000 mg | ORAL_TABLET | Freq: Two times a day (BID) | ORAL | Status: DC

## 2021-11-25 MED ORDER — THIAMINE HCL 100 MG/ML IJ SOLN: 100.0000 mg | Freq: Every day | INTRAMUSCULAR | Status: DC

## 2021-11-25 MED ORDER — THIAMINE HCL 100 MG PO TABS
100.0000 mg | ORAL_TABLET | Freq: Every day | ORAL | Status: DC
  Administered 2021-11-25: 100 mg via ORAL
  Filled 2021-11-25: qty 1

## 2021-11-25 MED ORDER — PANTOPRAZOLE SODIUM 40 MG PO TBEC
40.0000 mg | DELAYED_RELEASE_TABLET | Freq: Every day | ORAL | Status: DC
  Administered 2021-11-25: 40 mg via ORAL
  Filled 2021-11-25: qty 1

## 2021-11-25 MED ORDER — LORAZEPAM 2 MG/ML IJ SOLN: 0.0000 mg | Freq: Four times a day (QID) | INTRAMUSCULAR | Status: DC

## 2021-11-25 MED ORDER — LORAZEPAM 2 MG/ML IJ SOLN: 0.0000 mg | Freq: Two times a day (BID) | INTRAMUSCULAR | Status: DC

## 2021-11-25 MED ORDER — LORAZEPAM 1 MG PO TABS
0.0000 mg | ORAL_TABLET | Freq: Four times a day (QID) | ORAL | Status: DC
  Administered 2021-11-25: 2 mg via ORAL
  Administered 2021-11-25: 1 mg via ORAL
  Filled 2021-11-25 (×2): qty 2

## 2021-11-25 NOTE — ED Notes (Signed)
Pt states that he wants to be admitted to [facility]. States that he has been off his meds and would like help getting back on schedule.

## 2021-11-25 NOTE — Progress Notes (Addendum)
Per Danella Deis, Admissions, pt has been accepted to [facility], Adult unit bed 912. Accepting provider is Dr. Lorenso Courier. Patient can arrive anytime. Number for report is [phone].  Crissie Reese, MSW, LCSW-A Phone: [phone] Disposition/TOC

## 2021-11-25 NOTE — ED Provider Notes (Signed)
[facility] accepted [facility], Dr. Dennard Nip.  Patient will be transferred.  Transfer paperwork completed.   Vanetta Mulders, MD 11/25/21 1220

## 2021-11-25 NOTE — Progress Notes (Signed)
Per Ysidro Evert Bobbitt,NP, patient meets criteria for inpatient treatment. There are no available or appropriate beds at [facility] today. CSW faxed referrals to the following facilities for review:  [facility]  Pending - No Request Sent N/A [address], [place] [address] [phone] --  [facility]  Pending - No Request Sent N/A [address] [place] [address] [phone] --  [facility]  Pending - No Request Sent N/A [address] [place] [address] [phone] --  [facility]  Pending - No Request Sent N/A [address] [place] [address] [phone] --  [facility]  Pending - No Request Sent N/A [address] [place], [place] [address] [phone] --  [facility]  Pending - No Request Sent N/A [address] [place] [address] [phone] --  [facility]  Pending - No Request Sent N/A [address] [place] [address] [phone] --  [facility]  Pending - No Request Sent N/A [address] [place] [address] [phone] --  [facility]  Pending - No Request Sent N/A [address] [place] [address] [phone] --  [facility]  Pending - No Request Sent N/A [address], [place] [address] [phone] --  [facility]  Pending - No Request Sent N/A [address] [place] [address] [phone] --  [facility]  Pending - No Request Sent N/A [address] [name] [address] [phone] --  [facility]  Pending - No Request Sent N/A [address] [place] [address] [phone] --  [facility]  Pending - No Request Sent N/A [address], [place] [address] [phone] --  [facility]  Pending - No Request Sent N/A [address] [place] [address] [phone] --   TTS will continue to seek bed placement.  Crissie Reese, MSW, LCSW-A, LCAS-A Phone: [phone] Disposition/TOC

## 2021-11-25 NOTE — ED Notes (Signed)
Patients 2 bags of belongings moved to [employer] [address].

## 2021-11-25 NOTE — ED Provider Notes (Signed)
[place] DEPT Provider Note   CSN: [id number] Arrival date & time: 11/25/21  0235     History Chief Complaint  Patient presents with   Suicidal    Rene Lardy is a 30 y.o. male.  The history is provided by the patient and medical records.   30 year old male with history of alcohol abuse, anxiety, bipolar disorder, depression, PTSD, chronic pain, presenting to the ED with suicidal ideation.  He denies any specific plan.  Does admit that 11/4 was the anniversary of his fathers death which is a trigger for him.  Was also recently incarcerated on "some charges" which really messed up his mental aspect.  He went to court yesterday for same, charges were dismissed so after court he started drinking.  Long-term alcoholic, drinking heavily for the past 15 years, 6-8 beers daily.  Last drink was at 12 AM this morning (3 hours ago).  He is concerned he may start going into withdrawal. Also has self inflicted wounds to left forearm from knife.  States he just kept "scratching" himself with the knife.  Hx of same in the past but not for many years.  Past Medical History:  Diagnosis Date   Alcohol abuse    Anxiety    Bipolar 2 disorder [facility])    Depression     Patient Active Problem List   Diagnosis Date Noted   Major depressive disorder, recurrent episode, severe ([place]) 10/25/2021   PTSD (post-traumatic stress disorder) 10/25/2021   MDD (major depressive disorder), recurrent episode, severe ([place]) 06/06/2021   Suicidal ideations 05/24/2021   MDD (major depressive disorder), recurrent severe, without psychosis ([place]) 05/24/2021   Depression 03/20/2021   Compression fracture of T7 vertebra ([place]) 03/05/2021   COVID-19 virus infection 03/05/2021   Malingering 12/12/2020   Alcohol use, unspecified with withdrawal delirium ([place]) 12/03/2020   Alcohol withdrawal ([place]) 09/06/2020   Chronic low back pain 07/14/2020   Personal history of scoliosis 07/14/2020    Cannabis use disorder, severe, dependence ([place]) 06/06/2020   Alcohol-induced mood disorder ([place]) 04/27/2020   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 ([place]) 04/20/2020   Homicidal ideation    Thrombocytopenia ([place]) 04/09/2020   Benzodiazepine dependence ([place]) 11/17/2019   Drug withdrawal seizure without complication ([place]) [id number]   Elevated liver enzymes 11/17/2019   Tobacco abuse 11/17/2019   Alcohol dependence with withdrawal ([place]) 07/09/2019   GAD (generalized anxiety disorder) 03/09/2019   Alcohol use disorder, severe, dependence ([place]) 03/09/2019   Mild episode of recurrent major depressive disorder ([place]) 03/09/2019   Reduced libido 04/17/2016    Past Surgical History:  Procedure Laterality Date   HAND SURGERY Right        Family History  Problem Relation Age of Onset   Anxiety disorder Mother    Alcohol abuse Father    Anxiety disorder Maternal Aunt    Anxiety disorder Maternal Grandmother    Alcohol abuse Paternal Grandfather     Social History   Tobacco Use   Smoking status: Every Day    Packs/day: 1.50    Types: Cigarettes   Smokeless tobacco: Former    Types: Snuff  Vaping Use   Vaping Use: Never used  Substance Use Topics   Alcohol use: Yes    Alcohol/week: 2.0 standard drinks    Types: 2 Cans of beer per week    Comment: 48 beers a week   Drug use: Not Currently    Types: Marijuana, Cocaine    Comment: cocaine in  last month    Home Medications Prior to Admission medications   Medication Sig Start Date End Date Taking? Authorizing Provider  carbamazepine (TEGRETOL) 100 MG chewable tablet Chew 1 tablet (100 mg total) by mouth 3 (three) times daily. 11/10/21   Lucky Rathke, FNP  dicyclomine (BENTYL) 10 MG capsule Take 1 capsule (10 mg total) by mouth 4 (four) times daily -  before meals and at bedtime. 11/10/21   Lucky Rathke, FNP  DULoxetine HCl 40 MG CPEP Take 40 mg by mouth daily. 11/10/21   Lucky Rathke, FNP   gabapentin (NEURONTIN) 300 MG capsule Take 1 capsule (300 mg total) by mouth 3 (three) times daily. 11/10/21   Lucky Rathke, FNP  hydrOXYzine (ATARAX/VISTARIL) 50 MG tablet Take 1 tablet (50 mg total) by mouth 3 (three) times daily as needed for anxiety. 11/10/21   Lucky Rathke, FNP  pantoprazole (PROTONIX) 40 MG tablet Take 1 tablet (40 mg total) by mouth daily. 11/10/21   Lucky Rathke, FNP  traZODone (DESYREL) 100 MG tablet Take 1 tablet (100 mg total) by mouth at bedtime as needed for sleep. 11/10/21   Lucky Rathke, FNP    Allergies    Patient has no known allergies.  Review of Systems   Review of Systems  Psychiatric/Behavioral:  Positive for suicidal ideas.   All other systems reviewed and are negative.  Physical Exam Updated Vital Signs BP 115/79 (BP Location: Right Arm)   Pulse 98   Temp (!) 97.4 F (36.3 C) (Oral)   Resp 16   Ht 6\' 1"  (1.854 m)   Wt 91 kg   SpO2 98%   BMI 26.47 kg/m   Physical Exam Vitals and nursing note reviewed.  Constitutional:      Appearance: He is well-developed.  HENT:     Head: Normocephalic and atraumatic.  Eyes:     Conjunctiva/sclera: Conjunctivae normal.     Pupils: Pupils are equal, round, and reactive to light.  Cardiovascular:     Rate and Rhythm: Normal rate and regular rhythm.     Heart sounds: Normal heart sounds.  Pulmonary:     Effort: Pulmonary effort is normal. No respiratory distress.     Breath sounds: Normal breath sounds. No rhonchi.  Abdominal:     General: Bowel sounds are normal.     Palpations: Abdomen is soft.  Musculoskeletal:        General: Normal range of motion.     Cervical back: Normal range of motion.     Comments: Self inflicted superficial wounds noted to left volar forearm, no bleeding or acute signs of infection  Skin:    General: Skin is warm and dry.  Neurological:     Mental Status: He is alert and oriented to person, place, and time.  Psychiatric:        Thought Content: Thought  content includes suicidal ideation. Thought content does not include suicidal plan.     Comments: SI without plan, seems depressed, staring at floor during conversation    ED Results / Procedures / Treatments   Labs (all labs ordered are listed, but only abnormal results are displayed) Labs Reviewed  COMPREHENSIVE METABOLIC PANEL - Abnormal; Notable for the following components:      Result Value   CO2 21 (*)    Glucose, Bld 104 (*)    Total Protein 8.4 (*)    All other components within normal limits  ETHANOL - Abnormal; Notable for the  following components:   Alcohol, Ethyl (B) 364 (*)    All other components within normal limits  RESP PANEL BY RT-PCR (FLU A&B, COVID) ARPGX2  CBC  RAPID URINE DRUG SCREEN, HOSP PERFORMED    EKG None  Radiology No results found.  Procedures Procedures   Medications Ordered in ED Medications  LORazepam (ATIVAN) injection 0-4 mg ( Intravenous See Alternative 11/25/21 0513)    Or  LORazepam (ATIVAN) tablet 0-4 mg (2 mg Oral Given 11/25/21 0513)  LORazepam (ATIVAN) injection 0-4 mg (has no administration in time range)    Or  LORazepam (ATIVAN) tablet 0-4 mg (has no administration in time range)  thiamine tablet 100 mg (has no administration in time range)    Or  thiamine (B-1) injection 100 mg (has no administration in time range)    ED Course  I have reviewed the triage vital signs and the nursing notes.  Pertinent labs & imaging results that were available during my care of the patient were reviewed by me and considered in my medical decision making (see chart for details).    MDM Rules/Calculators/A&P                           30 year old male here with suicidal ideation.  Recent anniversary of his father's death and has been facing some legal troubles.  Had court yesterday and began drinking afterwards, reports has been an alcoholic for the past 15 years.  Last drink around midnight.  He was concerned for potential withdrawal.  He  does seem depressed here, staring at the floor during conversation.  He has superficial self-inflicted wounds to left volar forearm but not requiring formal repair.  Labs as above, ethanol is 364.  No clinical signs or symptoms suggestive of withdrawal at this time.  Medically clear.  Given his history of alcohol abuse, will place on CIWA protocol.  TTS consult ordered.  TTS has evaluated, recommends IP treatment.  [facility] at capacity, will seek outside placement.  Final Clinical Impression(s) / ED Diagnoses Final diagnoses:  Suicidal ideation  Self-mutilation    Rx / DC Orders ED Discharge Orders     None        Garlon Hatchet, PA-C 11/25/21 0516    Glynn Octave, MD 11/25/21 [phone]

## 2021-11-25 NOTE — ED Triage Notes (Addendum)
Patient BIB [facility] from home with suicidal ideations for a few days. Presents with multiple superficial lacerations to his left forearm. Patient does not have a suicidal plan at the moment. Patient went out and celebrated after court. He had 1 40oz drink at the bar. For the past 15 years, he drinks 48 beers total a week. Patient has a seizure disorder and he was concerned about going through withdrawals. Last drink was 12am. He said he had 2 minor seizures this week.

## 2021-11-25 NOTE — ED Notes (Signed)
Pt has been wanded prior to being transferred to TCU. His belongings are placed in the locker # 29.

## 2021-11-25 NOTE — ED Provider Notes (Signed)
Emergency Medicine Observation Re-evaluation Note  John Vaughan is a 30 y.o. male, seen on rounds today.  Pt initially presented to the ED for complaints of Suicidal Currently, the patient is voluntary has a history of alcohol abuse bipolar had suicidal ideation had self cutting superficial wounds to left volar forearm.  Patient is under CIWA protocol patient evaluated by TTS and they recommended inpatient treatment..  Physical Exam  BP 115/79 (BP Location: Right Arm)   Pulse 98   Temp (!) 97.4 F (36.3 C) (Oral)   Resp 16   Ht 1.854 m (6\' 1" )   Wt 91 kg   SpO2 98%   BMI 26.47 kg/m  Physical Exam General: Resting no acute distress Cardiac: Not tachycardic Lungs: No respiratory distress Psych: Baseline  ED Course / MDM  EKG:   I have reviewed the labs performed to date as well as medications administered while in observation.  Recent changes in the last 24 hours include patient on CIWA protocol.  Plan  Current plan is for DTS recommending inpatient treatment.  No [facility] beds available.  So they are looking for a placement facility.  Patient is voluntary.  This may need to change if he tries to leave.  We will continue to monitor his alcohol withdrawal potential.   John Vaughan is not under involuntary commitment.     John Dinning, MD 11/25/21 [phone]

## 2021-11-25 NOTE — ED Notes (Signed)
Patient has 2 patient belonging bags that include a jacket, pants, shirt.
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
## 2022-05-01 ENCOUNTER — Encounter (HOSPITAL_COMMUNITY): Payer: Self-pay

## 2022-05-01 ENCOUNTER — Other Ambulatory Visit: Payer: Self-pay

## 2022-05-01 ENCOUNTER — Emergency Department (HOSPITAL_COMMUNITY)
Admission: EM | Admit: 2022-05-01 | Discharge: 2022-05-01 | Disposition: A | Discharge status: Home / Self Care | Payer: Self-pay | Attending: Emergency Medicine | Admitting: Emergency Medicine

## 2022-05-01 ENCOUNTER — Emergency Department (HOSPITAL_COMMUNITY): Payer: Self-pay

## 2022-05-01 DIAGNOSIS — Z79899 Other long term (current) drug therapy: Secondary | ICD-10-CM | POA: Insufficient documentation

## 2022-05-01 DIAGNOSIS — F1910 Other psychoactive substance abuse, uncomplicated: Secondary | ICD-10-CM | POA: Insufficient documentation

## 2022-05-01 DIAGNOSIS — R079 Chest pain, unspecified: Secondary | ICD-10-CM | POA: Insufficient documentation

## 2022-05-01 LAB — CBC
HCT: 46.1 % (ref 39.0–52.0)
Hemoglobin: 15.7 g/dL (ref 13.0–17.0)
MCH: 33.1 pg (ref 26.0–34.0)
MCHC: 34.1 g/dL (ref 30.0–36.0)
MCV: 97.1 fL (ref 80.0–100.0)
Platelets: 336 10*3/uL (ref 150–400)
RBC: 4.75 MIL/uL (ref 4.22–5.81)
RDW: 12.7 % (ref 11.5–15.5)
WBC: 6.8 10*3/uL (ref 4.0–10.5)
nRBC: 0 % (ref 0.0–0.2)

## 2022-05-01 LAB — TROPONIN I (HIGH SENSITIVITY)
Troponin I (High Sensitivity): <2 ng/L (ref <18)
Troponin I (High Sensitivity): <2 ng/L (ref <18)

## 2022-05-01 LAB — BASIC METABOLIC PANEL
Anion gap: 13 (ref 5–15)
BUN: 10 mg/dL (ref 6–20)
CO2: 19 mmol/L — ABNORMAL LOW (ref 22–32)
Calcium: 8.9 mg/dL (ref 8.9–10.3)
Chloride: 108 mmol/L (ref 98–111)
Creatinine, Ser: 0.71 mg/dL (ref 0.61–1.24)
GFR, Estimated: >60 mL/min (ref >60)
Glucose, Bld: 103 mg/dL — ABNORMAL HIGH (ref 70–99)
Potassium: 4 mmol/L (ref 3.5–5.1)
Sodium: 140 mmol/L (ref 135–145)

## 2022-05-01 LAB — RAPID URINE DRUG SCREEN, HOSP PERFORMED
Amphetamines: NOT DETECTED
Barbiturates: NOT DETECTED
Benzodiazepines: NOT DETECTED
Cocaine: POSITIVE — AB
Opiates: NOT DETECTED
Tetrahydrocannabinol: NOT DETECTED

## 2022-05-01 MED ORDER — LORAZEPAM 1 MG PO TABS
1.0000 mg | ORAL_TABLET | Freq: Once | ORAL | Status: AC
  Administered 2022-05-01: 1 mg via ORAL
  Filled 2022-05-01: qty 1

## 2022-05-01 NOTE — ED Notes (Addendum)
Pt states understanding of dc instructions, importance of follow up. Pt denies questions or concerns upon dc. Pt's IV removed, coban in place for compression. Pt declined wheelchair assistance upon dc. Pt ambulated out of ed w/ steady gait. No belongings left in room upon dc.  ED RN offered bus pass to pt, pt refused.  ED RN also offered sandwich and something to drink upon dc, pt refused.  Pt informed that there is a phone in the lobby to call for a ride.  Pt states understanding.  ? ?

## 2022-05-01 NOTE — ED Triage Notes (Signed)
Pt BIB EMS from home. Pts mother called EMS due to strange behavior. Pt endorse chest pain x3 weeks. Pt reports using crack and drinking alcohol a few hours ago. Pt appeared paranoid with EMS.  ? ?18G RFA ? NS ?HR 92 ?BP 144/68 ?100% RA ? ?

## 2022-05-01 NOTE — Discharge Instructions (Addendum)
You have been seen and discharged from the emergency department.  Your heart work-up was normal.  You could have had chest pain secondary to your cocaine use.  Using cocaine especially with alcohol can be detrimental to your health, cause a heart attack, results in permanent disability and/or death.  Please avoid any illegal substance use and copious amounts of alcohol.  Follow-up with your primary provider for further evaluation and further care. Take home medications as prescribed. If you have any worsening symptoms or further concerns for your health please return to an emergency department for further evaluation. ? ?If you have any thoughts of hurting yourself, severe depression, thoughts of suicide or hurting others please return to emergency department immediately. ?

## 2022-05-01 NOTE — ED Provider Notes (Signed)
?[facility]-EMERGENCY DEPT ?Provider Note ? ? ?CSN: [id number] ?Arrival date & time: 05/01/22  1053 ? ?  ? ?History ? ?Chief Complaint  ?Patient presents with  ? Chest Pain  ? Drug Problem  ? ? ?John Vaughan is a 31 y.o. male. ? ?HPI ? ?31 year old male with past medical history of depression, bipolar, polysubstance abuse presents emergency department with admitted cocaine/alcohol intake and an episode of chest pain.  Patient states his last use of cocaine would have been last night.  He is unclear when this episode of chest pain happened but states that it is currently resolved.  No ongoing chest pain/shortness of breath.  Patient states he has been increasing his intake of alcohol/cocaine over the past 5 days.  Does not admit any specific thoughts for self-harm/suicide.  He states that things "got out of control".  Denies any other symptoms of acute illness. ? ?Home Medications ?Prior to Admission medications   ?Medication Sig Start Date End Date Taking? Authorizing Provider  ?carbamazepine (TEGRETOL) 100 MG chewable tablet Chew 1 tablet (100 mg total) by mouth 3 (three) times daily. 11/10/21   Lenard Lance, FNP  ?dicyclomine (BENTYL) 10 MG capsule Take 1 capsule (10 mg total) by mouth 4 (four) times daily -  before meals and at bedtime. 11/10/21   Lenard Lance, FNP  ?DULoxetine HCl 40 MG CPEP Take 40 mg by mouth daily. 11/10/21   Lenard Lance, FNP  ?gabapentin (NEURONTIN) 300 MG capsule Take 1 capsule (300 mg total) by mouth 3 (three) times daily. 11/10/21   Lenard Lance, FNP  ?hydrOXYzine (ATARAX/VISTARIL) 50 MG tablet Take 1 tablet (50 mg total) by mouth 3 (three) times daily as needed for anxiety. 11/10/21   Lenard Lance, FNP  ?pantoprazole (PROTONIX) 40 MG tablet Take 1 tablet (40 mg total) by mouth daily. 11/10/21   Lenard Lance, FNP  ?traZODone (DESYREL) 100 MG tablet Take 1 tablet (100 mg total) by mouth at bedtime as needed for sleep. 11/10/21   Lenard Lance, FNP  ?    ? ?Allergies    ?Patient has no known allergies.   ? ?Review of Systems   ?Review of Systems  ?Constitutional:  Negative for fever.  ?Respiratory:  Negative for shortness of breath.   ?Cardiovascular:  Positive for chest pain.  ?Gastrointestinal:  Negative for abdominal pain, diarrhea and vomiting.  ?Musculoskeletal:  Negative for back pain and neck pain.  ?Skin:  Negative for rash.  ?Neurological:  Negative for headaches.  ? ?Physical Exam ?Updated Vital Signs ?BP 112/90 (BP Location: Right Arm)   Pulse 80   Temp 98.3 ?F (36.8 ?C) (Oral)   Resp 16   Ht 6\' 1"  (1.854 m)   Wt 90.7 kg   SpO2 97%   BMI 26.39 kg/m?  ?Physical Exam ?Vitals and nursing note reviewed.  ?Constitutional:   ?   General: He is not in acute distress. ?   Appearance: Normal appearance.  ?HENT:  ?   Head: Normocephalic.  ?   Mouth/Throat:  ?   Mouth: Mucous membranes are moist.  ?Cardiovascular:  ?   Rate and Rhythm: Normal rate.  ?Pulmonary:  ?   Effort: Pulmonary effort is normal. No respiratory distress.  ?   Breath sounds: No decreased breath sounds.  ?Abdominal:  ?   Palpations: Abdomen is soft.  ?   Tenderness: There is no abdominal tenderness.  ?Skin: ?   General: Skin is warm.  ?Neurological:  ?  Mental Status: He is alert and oriented to person, place, and time. Mental status is at baseline.  ?Psychiatric:     ?   Mood and Affect: Mood normal.  ? ? ?ED Results / Procedures / Treatments   ?Labs ?(all labs ordered are listed, but only abnormal results are displayed) ?Labs Reviewed  ?BASIC METABOLIC PANEL - Abnormal; Notable for the following components:  ?    Result Value  ? CO2 19 (*)   ? Glucose, Bld 103 (*)   ? All other components within normal limits  ?RAPID URINE DRUG SCREEN, HOSP PERFORMED - Abnormal; Notable for the following components:  ? Cocaine POSITIVE (*)   ? All other components within normal limits  ?CBC  ?TROPONIN I (HIGH SENSITIVITY)  ?TROPONIN I (HIGH SENSITIVITY)  ? ? ?EKG ?EKG  Interpretation ? ?Date/Time:  Tuesday May 01 2022 11:07:21 EDT ?Ventricular Rate:  81 ?PR Interval:  164 ?QRS Duration: 109 ?QT Interval:  348 ?QTC Calculation: 404 ?R Axis:   37 ?Text Interpretation: Sinus rhythm RSR' in V1 or V2, right VCD or RVH ST elev, probable normal early repol pattern borderline STE in v2-3 Confirmed by Coralee Pesa [phone]) on 05/01/2022 12:16:35 PM ? ?Radiology ?DG Chest 2 View ? ?Result Date: 05/01/2022 ?CLINICAL DATA:  Chest pain for 3 weeks, has been using crack cocaine and drinking alcohol, strange behavior, appeared paranoid with EMS EXAM: CHEST - 2 VIEW COMPARISON:  10/23/2021 FINDINGS: Normal heart size, mediastinal contours, and pulmonary vascularity. Lungs clear. No pleural effusion or pneumothorax. Bones unremarkable. IMPRESSION: Normal exam. Electronically Signed   By: Ulyses Southward M.D.   On: 05/01/2022 11:34   ? ?Procedures ?Procedures  ? ? ?Medications Ordered in ED ?Medications  ?LORazepam (ATIVAN) tablet 1 mg (1 mg Oral Given 05/01/22 1231)  ? ? ?ED Course/ Medical Decision Making/ A&P ?  ?                        ?Medical Decision Making ?Amount and/or Complexity of Data Reviewed ?Labs: ordered. ?Radiology: ordered. ? ? ?32 year old male presents emergency department after an episode of chest pain.  Admitted polysubstance abuse with cocaine and alcohol, increased intake over the last 5 days.  Last intake was last night.  Currently is chest pain-free.  No back pain or flank pain.  No shortness of breath.  No acute distress on exam.  Normal vitals. ? ?EKG is normal sinus, borderline ST elevation in septal leads, no STEMI criteria, no active chest pain. ? ?Blood work is reassuring, UDS is positive for cocaine, troponin is negative, no delta.  No return of chest pain.  Patient's been able to eat and drink, currently has no complaints.  Requesting to be discharged.  Adamantly denies any self-harm/depression/suicidal ideation/homicidal ideation.  Has clear speech, steady gait.   Outpatient resources provided.  Patient educated about dangers of polysubstance abuse and specific cocaine use. ? ?Patient at this time appears safe and stable for discharge and close outpatient follow up. Discharge plan and strict return to ED precautions discussed, patient verbalizes understanding and agreement. ? ? ? ? ? ? ? ?Final Clinical Impression(s) / ED Diagnoses ?Final diagnoses:  ?None  ? ? ?Rx / DC Orders ?ED Discharge Orders   ? ? None  ? ?  ? ? ?  ?Rozelle Logan, DO ?05/01/22 1625 ? ?

## 2022-05-01 NOTE — ED Provider Triage Note (Signed)
Emergency Medicine Provider Triage Evaluation Note ? ?John Vaughan , a 31 y.o. male  was evaluated in triage.  Pt complains of use of crack cocaine and EtOH abuse. Family members stated he was acting paranoid. He is tearful but denies any SI. Has a hx of cutting. Denies any recent SI. No HI. Endorses chest pain, described as a left sided pain with associated left arm numbness. No weakness. No radiation of the pain. It comes and goes. Endorses "numbess all the way down to my feet and it scares the shit out of me. I think I did too much crack cocaine." His chest pain has been ongoing for three weeks. Endorses cold sweats. His numbness has "been going on for 3 years." ? ?Review of Systems  ?Positive: Numbness, chest pain, paranoia, Shortness of breath ?Negative: Fever, chills ? ?Physical Exam  ?BP 112/90 (BP Location: Right Arm)   Pulse 80   Temp 98.3 ?F (36.8 ?C) (Oral)   Resp 16   Ht 6\' 1"  (1.854 m)   Wt 90.7 kg   SpO2 97%   BMI 26.39 kg/m?  ?Gen:   Awake, no distress   ?Resp:  Normal effort  ?Card:  Well perfused, pulses equal and intact bilaterally ?MSK:   Moves extremities without difficulty  ?Psych:   Tearful, paranoia, no SI, HI, AVH ?Neuro:  No cranial nerve deficit, moving all four extremities, no clear sensory deficit. ? ?Medical Decision Making  ?Medically screening exam initiated at 11:19 AM.  Appropriate orders placed.  John Vaughan was informed that the remainder of the evaluation will be completed by another provider, this initial triage assessment does not replace that evaluation, and the importance of remaining in the ED until their evaluation is complete. ? ? ?  ?Regan Lemming, MD ?05/01/22 1125 ? ?

## 2022-05-10 ENCOUNTER — Other Ambulatory Visit: Payer: Self-pay

## 2022-05-10 ENCOUNTER — Encounter (HOSPITAL_COMMUNITY): Payer: Self-pay

## 2022-05-10 ENCOUNTER — Emergency Department (HOSPITAL_COMMUNITY)
Admission: EM | Admit: 2022-05-10 | Discharge: 2022-05-13 | Disposition: A | Discharge status: Other Acute Inpatient Hospital | Payer: Self-pay | Attending: Emergency Medicine | Admitting: Emergency Medicine

## 2022-05-10 DIAGNOSIS — F1020 Alcohol dependence, uncomplicated: Secondary | ICD-10-CM | POA: Diagnosis present

## 2022-05-10 DIAGNOSIS — F1090 Alcohol use, unspecified, uncomplicated: Secondary | ICD-10-CM

## 2022-05-10 DIAGNOSIS — F1410 Cocaine abuse, uncomplicated: Secondary | ICD-10-CM | POA: Diagnosis present

## 2022-05-10 DIAGNOSIS — F1999 Other psychoactive substance use, unspecified with unspecified psychoactive substance-induced disorder: Secondary | ICD-10-CM | POA: Diagnosis present

## 2022-05-10 DIAGNOSIS — F1490 Cocaine use, unspecified, uncomplicated: Secondary | ICD-10-CM | POA: Insufficient documentation

## 2022-05-10 DIAGNOSIS — F332 Major depressive disorder, recurrent severe without psychotic features: Secondary | ICD-10-CM | POA: Diagnosis present

## 2022-05-10 DIAGNOSIS — F19188 Other psychoactive substance abuse with other psychoactive substance-induced disorder: Secondary | ICD-10-CM | POA: Insufficient documentation

## 2022-05-10 DIAGNOSIS — Z20822 Contact with and (suspected) exposure to covid-19: Secondary | ICD-10-CM | POA: Insufficient documentation

## 2022-05-10 DIAGNOSIS — R451 Restlessness and agitation: Secondary | ICD-10-CM | POA: Insufficient documentation

## 2022-05-10 DIAGNOSIS — F319 Bipolar disorder, unspecified: Secondary | ICD-10-CM | POA: Diagnosis present

## 2022-05-10 DIAGNOSIS — Y908 Blood alcohol level of 240 mg/100 ml or more: Secondary | ICD-10-CM | POA: Insufficient documentation

## 2022-05-10 LAB — CBC WITH DIFFERENTIAL/PLATELET
Abs Immature Granulocytes: 0.03 10*3/uL (ref 0.00–0.07)
Basophils Absolute: 0 10*3/uL (ref 0.0–0.1)
Basophils Relative: 1 %
Eosinophils Absolute: 0.1 10*3/uL (ref 0.0–0.5)
Eosinophils Relative: 2 %
HCT: 46.1 % (ref 39.0–52.0)
Hemoglobin: 15.9 g/dL (ref 13.0–17.0)
Immature Granulocytes: 1 %
Lymphocytes Relative: 50 %
Lymphs Abs: 2.5 10*3/uL (ref 0.7–4.0)
MCH: 33.3 pg (ref 26.0–34.0)
MCHC: 34.5 g/dL (ref 30.0–36.0)
MCV: 96.4 fL (ref 80.0–100.0)
Monocytes Absolute: 0.7 10*3/uL (ref 0.1–1.0)
Monocytes Relative: 14 %
Neutro Abs: 1.6 10*3/uL — ABNORMAL LOW (ref 1.7–7.7)
Neutrophils Relative %: 32 %
Platelets: 241 10*3/uL (ref 150–400)
RBC: 4.78 MIL/uL (ref 4.22–5.81)
RDW: 12.1 % (ref 11.5–15.5)
WBC: 4.9 10*3/uL (ref 4.0–10.5)
nRBC: 0 % (ref 0.0–0.2)

## 2022-05-10 LAB — RAPID URINE DRUG SCREEN, HOSP PERFORMED
Amphetamines: NOT DETECTED
Barbiturates: NOT DETECTED
Benzodiazepines: NOT DETECTED
Cocaine: POSITIVE — AB
Opiates: NOT DETECTED
Tetrahydrocannabinol: NOT DETECTED

## 2022-05-10 LAB — COMPREHENSIVE METABOLIC PANEL
ALT: 18 U/L (ref 0–44)
AST: 17 U/L (ref 15–41)
Albumin: 4.9 g/dL (ref 3.5–5.0)
Alkaline Phosphatase: 51 U/L (ref 38–126)
Anion gap: 8 (ref 5–15)
BUN: 8 mg/dL (ref 6–20)
CO2: 21 mmol/L — ABNORMAL LOW (ref 22–32)
Calcium: 8.9 mg/dL (ref 8.9–10.3)
Chloride: 114 mmol/L — ABNORMAL HIGH (ref 98–111)
Creatinine, Ser: 0.88 mg/dL (ref 0.61–1.24)
GFR, Estimated: >60 mL/min (ref >60)
Glucose, Bld: 100 mg/dL — ABNORMAL HIGH (ref 70–99)
Potassium: 3.8 mmol/L (ref 3.5–5.1)
Sodium: 143 mmol/L (ref 135–145)
Total Bilirubin: 0.6 mg/dL (ref 0.3–1.2)
Total Protein: 8.5 g/dL — ABNORMAL HIGH (ref 6.5–8.1)

## 2022-05-10 LAB — ETHANOL: Alcohol, Ethyl (B): 377 mg/dL (ref <10)

## 2022-05-10 LAB — CARBAMAZEPINE LEVEL, TOTAL: Carbamazepine Lvl: <2 ug/mL — ABNORMAL LOW (ref 4.0–12.0)

## 2022-05-10 MED ORDER — BENZTROPINE MESYLATE 0.5 MG PO TABS: 1.0000 mg | ORAL_TABLET | Freq: Four times a day (QID) | ORAL | Status: DC | PRN

## 2022-05-10 MED ORDER — LORAZEPAM 1 MG PO TABS
0.0000 mg | ORAL_TABLET | Freq: Two times a day (BID) | ORAL | Status: DC
  Administered 2022-05-12: 1 mg via ORAL
  Filled 2022-05-10: qty 1

## 2022-05-10 MED ORDER — THIAMINE HCL 100 MG/ML IJ SOLN: 100.0000 mg | Freq: Every day | INTRAMUSCULAR | Status: DC

## 2022-05-10 MED ORDER — THIAMINE HCL 100 MG PO TABS
100.0000 mg | ORAL_TABLET | Freq: Every day | ORAL | Status: DC
  Administered 2022-05-10 – 2022-05-12 (×3): 100 mg via ORAL
  Filled 2022-05-10 (×3): qty 1

## 2022-05-10 MED ORDER — LORAZEPAM 1 MG PO TABS
0.0000 mg | ORAL_TABLET | Freq: Four times a day (QID) | ORAL | Status: AC
  Administered 2022-05-10 – 2022-05-12 (×4): 1 mg via ORAL
  Filled 2022-05-10 (×2): qty 1
  Filled 2022-05-10: qty 2
  Filled 2022-05-10: qty 1

## 2022-05-10 MED ORDER — HALOPERIDOL 5 MG PO TABS
5.0000 mg | ORAL_TABLET | Freq: Four times a day (QID) | ORAL | Status: DC | PRN
  Administered 2022-05-10: 5 mg via ORAL
  Filled 2022-05-10: qty 1

## 2022-05-10 MED ORDER — LORAZEPAM 2 MG/ML IJ SOLN: 0.0000 mg | Freq: Four times a day (QID) | INTRAMUSCULAR | Status: AC

## 2022-05-10 MED ORDER — LORAZEPAM 2 MG/ML IJ SOLN: 0.0000 mg | Freq: Two times a day (BID) | INTRAMUSCULAR | Status: DC

## 2022-05-10 NOTE — ED Triage Notes (Signed)
Pt BIB [facility] with [name] paperwork.
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
## 2022-05-10 NOTE — ED Notes (Signed)
Lunch tray given. 

## 2022-05-10 NOTE — ED Notes (Signed)
Dinner tray given

## 2022-05-10 NOTE — BH Assessment (Signed)
@  1342, Attempted to complete patient's initial assessment. Per his nurse, "Patient is asleep at this time. Tried to wake him 2x prior to this message and patient requested we let him rest for now". TTS request that [facility] nursing staff notify TTS when patient is ready to be assessed.

## 2022-05-10 NOTE — ED Notes (Signed)
Pt is agitated with being in the hospital and not understanding the reason for being in the hospital.  Will have pt dress out when he is less agitated.

## 2022-05-11 DIAGNOSIS — F1090 Alcohol use, unspecified, uncomplicated: Secondary | ICD-10-CM

## 2022-05-11 DIAGNOSIS — F1410 Cocaine abuse, uncomplicated: Secondary | ICD-10-CM | POA: Diagnosis present

## 2022-05-11 DIAGNOSIS — F1999 Other psychoactive substance use, unspecified with unspecified psychoactive substance-induced disorder: Secondary | ICD-10-CM | POA: Diagnosis present

## 2022-05-11 MED ORDER — PANTOPRAZOLE SODIUM 40 MG PO TBEC
40.0000 mg | DELAYED_RELEASE_TABLET | Freq: Every day | ORAL | Status: DC
  Administered 2022-05-12: 40 mg via ORAL
  Filled 2022-05-11: qty 1

## 2022-05-11 MED ORDER — HYDROXYZINE HCL 25 MG PO TABS
50.0000 mg | ORAL_TABLET | Freq: Three times a day (TID) | ORAL | Status: DC | PRN
  Administered 2022-05-12: 50 mg via ORAL
  Filled 2022-05-11: qty 2

## 2022-05-11 MED ORDER — GABAPENTIN 300 MG PO CAPS
300.0000 mg | ORAL_CAPSULE | Freq: Three times a day (TID) | ORAL | Status: DC
  Administered 2022-05-12 (×2): 300 mg via ORAL
  Filled 2022-05-11 (×2): qty 1

## 2022-05-11 MED ORDER — TRAZODONE HCL 100 MG PO TABS
50.0000 mg | ORAL_TABLET | Freq: Every evening | ORAL | Status: DC | PRN
  Administered 2022-05-12: 50 mg via ORAL
  Filled 2022-05-11: qty 1

## 2022-05-11 NOTE — ED Provider Notes (Signed)
Emergency Medicine Observation Re-evaluation Note  John Vaughan is a 31 y.o. male, seen on rounds today.  Pt initially presented to the ED for complaints of [name] Currently, the patient is resting.  Physical Exam  BP 119/84 (BP Location: Right Arm)   Pulse 74   Temp 98.4 F (36.9 C) (Oral)   Resp 20   Ht 1.854 m (6\' 1" )   Wt 90.7 kg   SpO2 100%   BMI 26.38 kg/m  Physical Exam General: Sleep Cardiac: Regular rate Lungs: Breathing easily Psych: Deferred  ED Course / MDM  EKG:   I have reviewed the labs performed to date as well as medications administered while in observation.  Recent changes in the last 24 hours include labs notable for significant alcohol intoxication as well as positive cocaine.  Plan  Current plan is for patient still needs initial psych assessment. is under involuntary commitment.      John Dinning, MD 05/11/22 [phone]

## 2022-05-11 NOTE — Consult Note (Cosign Needed Addendum)
[facility] Face-to-Face Psychiatry Consult   Reason for Consult:  psych consult Referring Physician:  Benjiman Core, MD Patient Identification: John Vaughan MRN:  [id number] Principal Diagnosis: Substance-induced disorder [facility]) Diagnosis:  Principal Problem:   Substance-induced disorder (HCC) Active Problems:   Cocaine use disorder (HCC)   Alcohol use disorder, severe, dependence (HCC)   Total Time spent with patient: 20 minutes  Subjective:   John Vaughan is a 31 y.o. male patient admitted with alcohol use disorder.  Patient presents laying in bed; has slept majority of shift. He reports binge drinking on Wednesday; states he "doesn't remember" exact events that led to his [name]. UDS+cocaine, BAL 377. He currently denies any suicidal or homicidal ideations, auditory or visual hallucinations. Provided verbal permission to contact mother/petitioner for collateral information.  Chart reviewed; no active prescriptions noted, last refill noted 11/10/21 from [facility] encounter. No active outpatient services.   1349: Provider received message from administration stating patient's mother called expressing concern about patient's potential discharge  Collateral: John Vaughan (petitioner) [phone] (x5 no answer) last attempt 1515  HPI:  John Vaughan is a 31 year old male with an extensive history of polysubstance abuse including alcohol, cocaine, and benzodiazepine abuse, suicidal ideations, bipolar 2 disorder who presented to [facility] via [name] after substance abuse, medication non-compliance, and after throwing cabbage in mother's face.  Patient endorses "binge drinking" day before incident, states he doesn't remember details that led to [name]; mentions having an aunt move in with family causing increased distress on family unit. Per chart review, patient presented 05/01/22 for chest pain after increased substance use; UDS+cocaine. Current UDS+cocaine, BAL 377. Last CIWA Score 1.    Past Psychiatric History: Generalized anxiety disorder, cocaine use disorder, alcohol use disorder severe dependence, homicidal ideation, depression, suicidal ideations, benzodiazepine dependence, malingering, bipolar 2 disorder  Risk to Self:   Risk to Others:   Prior Inpatient Therapy:   Prior Outpatient Therapy:    Past Medical History:  Past Medical History:  Diagnosis Date   Alcohol abuse    Anxiety    Bipolar 2 disorder (HCC)    Depression     Past Surgical History:  Procedure Laterality Date   HAND SURGERY Right    Family History:  Family History  Problem Relation Age of Onset   Anxiety disorder Mother    Alcohol abuse Father    Anxiety disorder Maternal Aunt    Anxiety disorder Maternal Grandmother    Alcohol abuse Paternal Grandfather    Family Psychiatric  History: not noted Social History:  Social History   Substance and Sexual Activity  Alcohol Use Yes   Alcohol/week: 2.0 standard drinks   Types: 2 Cans of beer per week   Comment: 48 beers a week     Social History   Substance and Sexual Activity  Drug Use Not Currently   Types: Marijuana, Cocaine   Comment: cocaine in last month    Social History   Socioeconomic History   Marital status: Single    Spouse name: Not on file   Number of children: Not on file   Years of education: Not on file   Highest education level: Not on file  Occupational History   Not on file  Tobacco Use   Smoking status: Every Day    Packs/day: 1.50    Types: Cigarettes   Smokeless tobacco: Former    Types: Snuff  Vaping Use   Vaping Use: Never used  Substance and Sexual Activity  Alcohol use: Yes    Alcohol/week: 2.0 standard drinks    Types: 2 Cans of beer per week    Comment: 48 beers a week   Drug use: Not Currently    Types: Marijuana, Cocaine    Comment: cocaine in last month   Sexual activity: Not Currently  Other Topics Concern   Not on file  Social History Narrative   Not on file   Social  Determinants of Health   Financial Resource Strain: Not on file  Food Insecurity: Not on file  Transportation Needs: Not on file  Physical Activity: Not on file  Stress: Not on file  Social Connections: Not on file   Additional Social History:   Allergies:  No Known Allergies  Labs:  Results for orders placed or performed during the hospital encounter of 05/10/22 (from the past 48 hour(s))  Comprehensive metabolic panel     Status: Abnormal   Collection Time: 05/10/22 10:40 AM  Result Value Ref Range   Sodium 143 135 - 145 mmol/L   Potassium 3.8 3.5 - 5.1 mmol/L   Chloride 114 (H) 98 - 111 mmol/L   CO2 21 (L) 22 - 32 mmol/L   Glucose, Bld 100 (H) 70 - 99 mg/dL    Comment: Glucose reference range applies only to samples taken after fasting for at least 8 hours.   BUN 8 6 - 20 mg/dL   Creatinine, Ser 7.82 0.61 - 1.24 mg/dL   Calcium 8.9 8.9 - 95.6 mg/dL   Total Protein 8.5 (H) 6.5 - 8.1 g/dL   Albumin 4.9 3.5 - 5.0 g/dL   AST 17 15 - 41 U/L   ALT 18 0 - 44 U/L   Alkaline Phosphatase 51 38 - 126 U/L   Total Bilirubin 0.6 0.3 - 1.2 mg/dL   GFR, Estimated >21 >30 mL/min    Comment: (NOTE) Calculated using the CKD-EPI Creatinine Equation (2021)    Anion gap 8 5 - 15    Comment: Performed at [facility], [address], [place], [place] [address]  Ethanol     Status: Abnormal   Collection Time: 05/10/22 10:40 AM  Result Value Ref Range   Alcohol, Ethyl (B) 377 (HH) <10 mg/dL    Comment: CRITICAL RESULT CALLED TO, READ BACK BY AND VERIFIED WITH: BLAIR,I. RN AT 1115 05/10/22 MULLINS,T (NOTE) Lowest detectable limit for serum alcohol is 10 mg/dL.  For medical purposes only. Performed at [facility], [address] [place], [place] [address]   Urine rapid drug screen (hosp performed)     Status: Abnormal   Collection Time: 05/10/22 10:40 AM  Result Value Ref Range   Opiates NONE DETECTED NONE DETECTED   Cocaine POSITIVE (A) NONE  DETECTED   Benzodiazepines NONE DETECTED NONE DETECTED   Amphetamines NONE DETECTED NONE DETECTED   Tetrahydrocannabinol NONE DETECTED NONE DETECTED   Barbiturates NONE DETECTED NONE DETECTED    Comment: (NOTE) DRUG SCREEN FOR MEDICAL PURPOSES ONLY.  IF CONFIRMATION IS NEEDED FOR ANY PURPOSE, NOTIFY LAB WITHIN 5 DAYS.  LOWEST DETECTABLE LIMITS FOR URINE DRUG SCREEN Drug Class                     Cutoff (ng/mL) Amphetamine and metabolites    1000 Barbiturate and metabolites    200 Benzodiazepine                 200 Tricyclics and metabolites     300 Opiates and metabolites  300 Cocaine and metabolites        300 THC                            50 Performed at [facility], [address], [place], [place] [address]   CBC with Diff     Status: Abnormal   Collection Time: 05/10/22 10:40 AM  Result Value Ref Range   WBC 4.9 4.0 - 10.5 K/uL   RBC 4.78 4.22 - 5.81 MIL/uL   Hemoglobin 15.9 13.0 - 17.0 g/dL   HCT 60.4 54.0 - 98.1 %   MCV 96.4 80.0 - 100.0 fL   MCH 33.3 26.0 - 34.0 pg   MCHC 34.5 30.0 - 36.0 g/dL   RDW 19.1 47.8 - 29.5 %   Platelets 241 150 - 400 K/uL   nRBC 0.0 0.0 - 0.2 %   Neutrophils Relative % 32 %   Neutro Abs 1.6 (L) 1.7 - 7.7 K/uL   Lymphocytes Relative 50 %   Lymphs Abs 2.5 0.7 - 4.0 K/uL   Monocytes Relative 14 %   Monocytes Absolute 0.7 0.1 - 1.0 K/uL   Eosinophils Relative 2 %   Eosinophils Absolute 0.1 0.0 - 0.5 K/uL   Basophils Relative 1 %   Basophils Absolute 0.0 0.0 - 0.1 K/uL   Immature Granulocytes 1 %   Abs Immature Granulocytes 0.03 0.00 - 0.07 K/uL    Comment: Performed at [facility], [address], [place], [place] [address]  Carbamazepine level, total     Status: Abnormal   Collection Time: 05/10/22 10:40 AM  Result Value Ref Range   Carbamazepine Lvl <2.0 (L) 4.0 - 12.0 ug/mL    Comment: Performed at [facility], [address] [place], [place] [address]    Current  Facility-Administered Medications  Medication Dose Route Frequency Provider Last Rate Last Admin   haloperidol (HALDOL) tablet 5 mg  5 mg Oral Q6H PRN Benjiman Core, MD   5 mg at 05/10/22 1130   And   benztropine (COGENTIN) tablet 1 mg  1 mg Oral Q6H PRN Benjiman Core, MD       gabapentin (NEURONTIN) capsule 300 mg  300 mg Oral TID Leevy-Johnson, Ryanne Morand A, NP       hydrOXYzine (ATARAX) tablet 50 mg  50 mg Oral TID PRN Leevy-Johnson, Sameul Tagle A, NP       LORazepam (ATIVAN) injection 0-4 mg  0-4 mg Intravenous Q6H Benjiman Core, MD       Or   LORazepam (ATIVAN) tablet 0-4 mg  0-4 mg Oral Q6H Benjiman Core, MD   1 mg at 05/11/22 4696   [START ON 05/12/2022] LORazepam (ATIVAN) injection 0-4 mg  0-4 mg Intravenous Orma Render, MD       Or   Melene Muller ON 05/12/2022] LORazepam (ATIVAN) tablet 0-4 mg  0-4 mg Oral Q12H Benjiman Core, MD       pantoprazole (PROTONIX) EC tablet 40 mg  40 mg Oral Daily Leevy-Johnson, Tracey Hermance A, NP       thiamine tablet 100 mg  100 mg Oral Daily Benjiman Core, MD   100 mg at 05/11/22 1052   Or   thiamine (B-1) injection 100 mg  100 mg Intravenous Daily Benjiman Core, MD       traZODone (DESYREL) tablet 50 mg  50 mg Oral QHS PRN Leevy-Johnson, Lina Sar, NP       Current Outpatient  Medications  Medication Sig Dispense Refill   Multiple Vitamin (MULTIVITAMIN WITH MINERALS) TABS tablet Take 1 tablet by mouth daily.     carbamazepine (TEGRETOL) 100 MG chewable tablet Chew 1 tablet (100 mg total) by mouth 3 (three) times daily. (Patient not taking: Reported on 05/11/2022) 90 tablet 0   dicyclomine (BENTYL) 10 MG capsule Take 1 capsule (10 mg total) by mouth 4 (four) times daily -  before meals and at bedtime. (Patient not taking: Reported on 05/11/2022) 40 capsule 0   DULoxetine HCl 40 MG CPEP Take 40 mg by mouth daily. (Patient not taking: Reported on 05/11/2022) 30 capsule 0   gabapentin (NEURONTIN) 300 MG capsule Take 1 capsule (300 mg total) by  mouth 3 (three) times daily. (Patient not taking: Reported on 05/11/2022) 90 capsule 0   hydrOXYzine (ATARAX/VISTARIL) 50 MG tablet Take 1 tablet (50 mg total) by mouth 3 (three) times daily as needed for anxiety. (Patient not taking: Reported on 05/11/2022) 30 tablet 0   pantoprazole (PROTONIX) 40 MG tablet Take 1 tablet (40 mg total) by mouth daily. (Patient not taking: Reported on 05/11/2022) 30 tablet 0   traZODone (DESYREL) 100 MG tablet Take 1 tablet (100 mg total) by mouth at bedtime as needed for sleep. (Patient not taking: Reported on 05/11/2022) 30 tablet 0    Musculoskeletal: Strength & Muscle Tone: within normal limits Gait & Station: normal Patient leans: N/A  Psychiatric Specialty Exam:  Presentation  General Appearance: Casual  Eye Contact:Good  Speech:Clear and Coherent  Speech Volume:Normal  Handedness:Right   Mood and Affect  Mood:Euthymic  Affect:Congruent   Thought Process  Thought Processes:Goal Directed; Coherent  Descriptions of Associations:Intact  Orientation:Full (Time, Place and Person)  Thought Content:WDL  History of Schizophrenia/Schizoaffective disorder:No  Duration of Psychotic Symptoms:No data recorded Hallucinations:Hallucinations: None  Ideas of Reference:None  Suicidal Thoughts:Suicidal Thoughts: No  Homicidal Thoughts:Homicidal Thoughts: No   Sensorium  Memory:Immediate Good; Recent Poor; Remote Fair  Judgment:Fair  Insight:Fair   Executive Functions  Concentration:Good  Attention Span:Good  Recall:Good  Fund of Knowledge:Fair  Language:Fair   Psychomotor Activity  Psychomotor Activity:Psychomotor Activity: Normal  Assets  Assets:Physical Health; Resilience; Housing; Manufacturing systems engineer; Financial Resources/Insurance   Sleep  Sleep:Sleep: Good  Physical Exam: Physical Exam Vitals and nursing note reviewed.  Constitutional:      Appearance: Normal appearance. He is obese.  HENT:     Head:  Normocephalic.     Nose: Nose normal.     Mouth/Throat:     Mouth: Mucous membranes are moist.     Pharynx: Oropharynx is clear.  Eyes:     Pupils: Pupils are equal, round, and reactive to light.  Cardiovascular:     Rate and Rhythm: Normal rate.     Pulses: Normal pulses.  Pulmonary:     Effort: Pulmonary effort is normal.     Breath sounds: Normal breath sounds.  Abdominal:     Palpations: Abdomen is soft.  Musculoskeletal:        General: Normal range of motion.     Cervical back: Normal range of motion.  Skin:    General: Skin is warm and dry.  Neurological:     Mental Status: He is alert and oriented to person, place, and time.  Psychiatric:        Attention and Perception: Attention and perception normal. He does not perceive auditory or visual hallucinations.        Mood and Affect: Mood and affect normal.  Speech: Speech normal.        Behavior: Behavior normal. Behavior is cooperative.        Thought Content: Thought content normal. Thought content is not paranoid or delusional. Thought content does not include homicidal or suicidal ideation. Thought content does not include homicidal or suicidal plan.        Cognition and Memory: Cognition and memory normal.        Judgment: Judgment normal.   Review of Systems  Psychiatric/Behavioral:  Positive for substance abuse.   All other systems reviewed and are negative. Blood pressure 107/82, pulse 64, temperature 97.9 F (36.6 C), temperature source Oral, resp. rate 14, height 6\' 1"  (1.854 m), weight 90.7 kg, SpO2 100 %. Body mass index is 26.38 kg/m.  Treatment Plan Summary: Plan Continue to monitor patient in emergency department. Patient to be re-evaluated by psychiatry in the morning.   Disposition: Supportive therapy provided about ongoing stressors. Discussed crisis plan, support from social network, calling 911, coming to the Emergency Department, and calling Suicide Hotline.  Loletta ParishBrooke A Leevy-Johnson,  NP 05/11/2022 7:06 PM

## 2022-05-12 ENCOUNTER — Other Ambulatory Visit: Payer: Self-pay | Admitting: Psychiatry

## 2022-05-12 DIAGNOSIS — F319 Bipolar disorder, unspecified: Secondary | ICD-10-CM | POA: Diagnosis present

## 2022-05-12 LAB — RESP PANEL BY RT-PCR (FLU A&B, COVID) ARPGX2
Influenza A by PCR: NEGATIVE
Influenza B by PCR: NEGATIVE
SARS Coronavirus 2 by RT PCR: NEGATIVE

## 2022-05-12 NOTE — ED Notes (Signed)
Rn contacted [facility]. AC, Kim, will arrive after 11pm and determine pt's arrival ETA.

## 2022-05-12 NOTE — ED Notes (Signed)
Pt doing word searches quietly

## 2022-05-12 NOTE — ED Notes (Signed)
Pt calm and cooperative while vitals are being taken.

## 2022-05-12 NOTE — ED Notes (Signed)
Pt calm and cooperative. Lunch tray provided. Pt made a phone call. Pt denies any complaints at this time
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
## 2022-05-12 NOTE — ED Provider Notes (Signed)
  Physical Exam  BP 99/68 (BP Location: Left Arm)   Pulse 75   Temp 98.1 F (36.7 C) (Oral)   Resp 16   Ht 6\' 1"  (1.854 m)   Wt 90.7 kg   SpO2 100%   BMI 26.38 kg/m   Physical Exam  Procedures  Procedures  ED Course / MDM    Medical Decision Making Amount and/or Complexity of Data Reviewed Labs: ordered.  Risk OTC drugs. Prescription drug management.   Patient has been here for 45 hours.  Mental status is improved.  Denies remembering some of the events.  Reviewing note from psychiatry from yesterday as planned for reevaluation this morning by psychiatry.       Davonna Belling, MD 05/12/22 [phone]

## 2022-05-12 NOTE — ED Notes (Signed)
Pt calm and cooperative. Eating breakfast peacefully.

## 2022-05-12 NOTE — ED Notes (Signed)
Pt patiently waiting fr psychiatric reassessment

## 2022-05-12 NOTE — ED Notes (Addendum)
Pt lying in bed resting. Respirations even and unlabored. Calm and pleasant with staff

## 2022-05-13 ENCOUNTER — Inpatient Hospital Stay (HOSPITAL_COMMUNITY)
Admission: AD | Admit: 2022-05-13 | Discharge: 2022-05-18 | DRG: 885 | Disposition: A | Discharge status: Home / Self Care | Payer: Federal, State, Local not specified - Other | Source: Intra-hospital | Attending: Psychiatry | Admitting: Psychiatry

## 2022-05-13 ENCOUNTER — Other Ambulatory Visit: Payer: Self-pay

## 2022-05-13 ENCOUNTER — Encounter (HOSPITAL_COMMUNITY): Payer: Self-pay | Admitting: Psychiatry

## 2022-05-13 DIAGNOSIS — F3130 Bipolar disorder, current episode depressed, mild or moderate severity, unspecified: Secondary | ICD-10-CM | POA: Diagnosis present

## 2022-05-13 DIAGNOSIS — Z811 Family history of alcohol abuse and dependence: Secondary | ICD-10-CM | POA: Diagnosis not present

## 2022-05-13 DIAGNOSIS — F3181 Bipolar II disorder: Secondary | ICD-10-CM | POA: Diagnosis present

## 2022-05-13 DIAGNOSIS — Y908 Blood alcohol level of 240 mg/100 ml or more: Secondary | ICD-10-CM | POA: Diagnosis present

## 2022-05-13 DIAGNOSIS — F10939 Alcohol use, unspecified with withdrawal, unspecified: Secondary | ICD-10-CM | POA: Diagnosis present

## 2022-05-13 DIAGNOSIS — F17210 Nicotine dependence, cigarettes, uncomplicated: Secondary | ICD-10-CM | POA: Diagnosis present

## 2022-05-13 DIAGNOSIS — F1410 Cocaine abuse, uncomplicated: Secondary | ICD-10-CM | POA: Diagnosis present

## 2022-05-13 DIAGNOSIS — F1020 Alcohol dependence, uncomplicated: Secondary | ICD-10-CM | POA: Diagnosis present

## 2022-05-13 DIAGNOSIS — F10239 Alcohol dependence with withdrawal, unspecified: Secondary | ICD-10-CM | POA: Diagnosis present

## 2022-05-13 DIAGNOSIS — G4700 Insomnia, unspecified: Secondary | ICD-10-CM | POA: Diagnosis present

## 2022-05-13 DIAGNOSIS — Z818 Family history of other mental and behavioral disorders: Secondary | ICD-10-CM

## 2022-05-13 DIAGNOSIS — R251 Tremor, unspecified: Secondary | ICD-10-CM | POA: Diagnosis present

## 2022-05-13 DIAGNOSIS — K219 Gastro-esophageal reflux disease without esophagitis: Secondary | ICD-10-CM | POA: Diagnosis present

## 2022-05-13 DIAGNOSIS — F411 Generalized anxiety disorder: Secondary | ICD-10-CM | POA: Diagnosis present

## 2022-05-13 DIAGNOSIS — Z79899 Other long term (current) drug therapy: Secondary | ICD-10-CM

## 2022-05-13 DIAGNOSIS — F319 Bipolar disorder, unspecified: Secondary | ICD-10-CM | POA: Diagnosis present

## 2022-05-13 DIAGNOSIS — F3163 Bipolar disorder, current episode mixed, severe, without psychotic features: Secondary | ICD-10-CM | POA: Diagnosis not present

## 2022-05-13 DIAGNOSIS — G40909 Epilepsy, unspecified, not intractable, without status epilepticus: Secondary | ICD-10-CM | POA: Diagnosis present

## 2022-05-13 DIAGNOSIS — F314 Bipolar disorder, current episode depressed, severe, without psychotic features: Secondary | ICD-10-CM | POA: Diagnosis not present

## 2022-05-13 LAB — URINALYSIS, ROUTINE W REFLEX MICROSCOPIC
Bilirubin Urine: NEGATIVE
Glucose, UA: NEGATIVE mg/dL
Hgb urine dipstick: NEGATIVE
Ketones, ur: NEGATIVE mg/dL
Leukocytes,Ua: NEGATIVE
Nitrite: NEGATIVE
Protein, ur: NEGATIVE mg/dL
Specific Gravity, Urine: 1.015 (ref 1.005–1.030)
pH: 6 (ref 5.0–8.0)

## 2022-05-13 MED ORDER — TRAZODONE HCL 100 MG PO TABS
100.0000 mg | ORAL_TABLET | Freq: Every evening | ORAL | Status: DC | PRN
  Administered 2022-05-13 – 2022-05-17 (×5): 100 mg via ORAL
  Filled 2022-05-13 (×5): qty 1

## 2022-05-13 MED ORDER — ADULT MULTIVITAMIN W/MINERALS CH
1.0000 | ORAL_TABLET | Freq: Every day | ORAL | Status: DC
Start: 2022-05-13 — End: 2022-05-18
  Administered 2022-05-13 – 2022-05-18 (×5): 1 via ORAL
  Filled 2022-05-13 (×10): qty 1

## 2022-05-13 MED ORDER — PANTOPRAZOLE SODIUM 40 MG PO TBEC
40.0000 mg | DELAYED_RELEASE_TABLET | Freq: Every day | ORAL | Status: DC
  Administered 2022-05-13 – 2022-05-18 (×6): 40 mg via ORAL
  Filled 2022-05-13 (×7): qty 1
  Filled 2022-05-13: qty 7
  Filled 2022-05-13: qty 1

## 2022-05-13 MED ORDER — HYDROXYZINE HCL 25 MG PO TABS
25.0000 mg | ORAL_TABLET | Freq: Three times a day (TID) | ORAL | Status: DC | PRN
  Administered 2022-05-13 (×2): 25 mg via ORAL
  Filled 2022-05-13 (×3): qty 1

## 2022-05-13 MED ORDER — CARBAMAZEPINE 100 MG PO CHEW
100.0000 mg | CHEWABLE_TABLET | Freq: Three times a day (TID) | ORAL | Status: DC
  Administered 2022-05-13 – 2022-05-17 (×12): 100 mg via ORAL
  Filled 2022-05-13 (×17): qty 1

## 2022-05-13 MED ORDER — TRAZODONE HCL 50 MG PO TABS
50.0000 mg | ORAL_TABLET | Freq: Every evening | ORAL | Status: DC | PRN
  Administered 2022-05-13: 50 mg via ORAL
  Filled 2022-05-13: qty 1

## 2022-05-13 MED ORDER — HYDROXYZINE HCL 50 MG PO TABS
50.0000 mg | ORAL_TABLET | Freq: Three times a day (TID) | ORAL | Status: DC | PRN
  Administered 2022-05-13 – 2022-05-18 (×7): 50 mg via ORAL
  Filled 2022-05-13: qty 1
  Filled 2022-05-13: qty 10
  Filled 2022-05-13 (×6): qty 1

## 2022-05-13 MED ORDER — NICOTINE 14 MG/24HR TD PT24
14.0000 mg | MEDICATED_PATCH | Freq: Every day | TRANSDERMAL | Status: DC
Start: 2022-05-13 — End: 2022-05-18
  Administered 2022-05-13 – 2022-05-18 (×6): 14 mg via TRANSDERMAL
  Filled 2022-05-13 (×4): qty 1
  Filled 2022-05-13: qty 7
  Filled 2022-05-13 (×3): qty 1

## 2022-05-13 MED ORDER — GABAPENTIN 300 MG PO CAPS
300.0000 mg | ORAL_CAPSULE | Freq: Three times a day (TID) | ORAL | Status: DC
  Administered 2022-05-13 – 2022-05-18 (×16): 300 mg via ORAL
  Filled 2022-05-13 (×5): qty 1
  Filled 2022-05-13: qty 21
  Filled 2022-05-13 (×7): qty 1
  Filled 2022-05-13: qty 21
  Filled 2022-05-13 (×6): qty 1
  Filled 2022-05-13: qty 21
  Filled 2022-05-13: qty 1

## 2022-05-13 MED ORDER — DULOXETINE HCL 20 MG PO CPEP
40.0000 mg | ORAL_CAPSULE | Freq: Every day | ORAL | Status: DC
  Administered 2022-05-13 – 2022-05-17 (×5): 40 mg via ORAL
  Filled 2022-05-13 (×8): qty 2
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
## 2022-05-13 NOTE — Progress Notes (Signed)
[facility] Group Notes:  (Nursing/MHT/Case Management/Adjunct)  Date:  05/13/2022  Time:  2015  Type of Therapy:   wrap up group  Participation Level:  Active  Participation Quality:  Appropriate, Attentive, Sharing, and Supportive  Affect:  Appropriate  Cognitive:  Alert  Insight:  Improving  Engagement in Group:  Engaged  Modes of Intervention:  Clarification, Education, and Support  Summary of Progress/Problems: Positive thinking and self-care were discussed.   Johann Capers S 05/13/2022, 9:04 PM

## 2022-05-13 NOTE — [facility] Suicide Risk Assessment (Signed)
[facility] INPATIENT:  Family/Significant Other Suicide Prevention Education  Suicide Prevention Education:  Contact Attempts: Avion Kutzer, Mother, [phone] has been identified by the patient as the family member/significant other with whom the patient will be residing, and identified as the person(s) who will aid the patient in the event of a mental health crisis.  With written consent from the patient, two attempts were made to provide suicide prevention education, prior to and/or following the patient's discharge.  We were unsuccessful in providing suicide prevention education.  A suicide education pamphlet was given to the patient to share with family/significant other.  Date and time of first attempt: 05/13/2022 @ 3:05 PM  Date and time of second attempt: CSW will make additional attempts to reach collateral.   Corky Crafts 05/13/2022, 3:04 PM

## 2022-05-13 NOTE — ED Notes (Signed)
Transportation called to be arranged to take to [facility]. Report called to adult unit.

## 2022-05-13 NOTE — Progress Notes (Signed)
John Vaughan is a 31 y.o. male involuntarily admitted for  substance abuse. Pt stated he went to the with a complain of chest pain which resolved. Pt stated he got mad after his allowed his aunt and the BF to stay  in her house which led to things getting out of hand. Pt stated he need help with his Bipolar and ways to stop his drinking. Pt is alert and oriented, has been cooperated with admission process. Denies SI/HI at this time. Consents signed, skin/belongings search completed and pt oriented to unit. Pt stable at this time. Pt given the opportunity to express concerns and ask questions. Pt given toiletries. Will continue to monitor.

## 2022-05-13 NOTE — Tx Team (Signed)
Initial Treatment Plan 05/13/2022 2:18 AM [name] PXT:[id number]    PATIENT STRESSORS: Financial difficulties   Marital or family conflict   Medication change or noncompliance   Occupational concerns   Substance abuse     PATIENT STRENGTHS: Active sense of humor  Communication skills  Physical Health  Supportive family/friends    PATIENT IDENTIFIED PROBLEMS: Substance use  Anxiety  "Help with my Bipolar"  "Get some therapy"               DISCHARGE CRITERIA:  Ability to meet basic life and health needs Adequate post-discharge living arrangements Improved stabilization in mood, thinking, and/or behavior Motivation to continue treatment in a less acute level of care Verbal commitment to aftercare and medication compliance  PRELIMINARY DISCHARGE PLAN: Attend aftercare/continuing care group Attend PHP/IOP Attend 12-step recovery group Outpatient therapy  PATIENT/FAMILY INVOLVEMENT: This treatment plan has been presented to and reviewed with the patient, Foch Rosenwald, and/or family member.  The patient and family have been given the opportunity to ask questions and make suggestions.  Bethann Punches, RN 05/13/2022, 2:18 AM

## 2022-05-13 NOTE — [facility] Group Notes (Signed)
Adult Psychoeducational Group Not Date:  05/13/2022 Time:  [id number] Group Topic/Focus: PROGRESSIVE RELAXATION. A group where deep breathing is taught and tensing and relaxation muscle groups is used. Imagery is used as well.  Pts are asked to imagine 3 pillars that hold them up when they are not able to hold themselves up and to share that with the group.  Participation Level:  did not attend  Paulino Rily

## 2022-05-13 NOTE — H&P (Signed)
Psychiatric Admission Assessment Adult  Patient Identification: John Vaughan  MRN:  [id number]  Date of Evaluation:  05/13/2022  Chief Complaint:  Worsening alcohol use/bipolar symptoms.  Principal Diagnosis: Bipolar affective disorder (HCC)  Diagnosis:  Principal Problem:   Bipolar affective disorder (HCC)  History of Present Illness: This is one of several psychiatric admissions/evaluations in this [facility] for this 31 year old Caucasian male with hx of alcoholism, bipolar disorder  & seizure disorder. He is known in this [facility] from his previous admissions in this hospital all with similar complaints. Patient is known to be non-adherent to his treatment regimen. He is being admitted to the [facility] this time around with complain of mood instability after an altercation with mother. On arrival to the ED, his BAL was 377 & UDS was positive for cocaine. He is currently not presenting with any substance withdrawal symptoms. During this evaluation, John Vaughan) reports,  "The sheriff took me to the [facility] ED the other day. Me & my mama were having a little problem because of family issues. The argument got heated because of alcohol & drug use involvement. I was drinking a lot of alcohol at the time but my mama was doing the drugging. I do not know who called the sheriff, but when they came, they wanted me to go to the hospital to get checked out. But I did not want to do that, so they sheriff [name]'ed me. I'm feeling anxious today because I wanted to talk to my my mama, but she did not want to talk to me. Besides, when ever I talked to my mama, it is like talking to the wall. She thinks that I'm the one with problems because of my alcohol use, although she is the one that uses cocaine everyday. She does not want to get help for her drug use. I have been drinking alcohol since I was 31 years old. The medications I have tried so far for my mental health has helped. The only thing is that I don't  always take them. I was diagnosed with bipolar disorder last December while I was at a facility in [place], [place].  I took Lithium while I was there because I have issues with outburst. My outburst is usually triggered like when my mama starts to nag me. I usually will an outburst once a month. I did attempt suicide once, a couple of years ago by cutting my wrist. I do have seizure disorder & I was on Tegretol tid. I took it last about a month ago. I would like to get back. I drink 2 bottles of the 40 ounce beer twice a week & I use cocaine once a month with my mama. I'm not having any withdrawal symptoms. I'm not hearing any voices or seeing things. I'm not feeling suicidal or homicidal, no delusions or paranoia. I feel okay".  Reviewed current lab result. Will obtain u/a, lipid panel, hgba1c, TSH.  Associated Signs/Symptoms:  Depression Symptoms:  depressed mood, insomnia, suicidal thoughts with specific plan,  Duration of Depression Symptoms: Greater than two weeks  (Hypo) Manic Symptoms:  Impulsivity, Irritable Mood, Labiality of Mood,  Anxiety Symptoms:  Excessive Worry,  Psychotic Symptoms:   Denied  PTSD Symptoms: Negative  Total Time spent with patient: 1 hour  Past Psychiatric History: Patient has been hospitalized at our facility for similar circumstances x multiple times in the past.  He has a longstanding history of alcohol dependence.  This started at age 31.  His last admission was secondary to suicidal ideation as well as substance abuse.  Is the patient at risk to self? No.  Has the patient been a risk to self in the past 6 months? Yes.    Has the patient been a risk to self within the distant past? No.  Is the patient a risk to others? No.  Has the patient been a risk to others in the past 6 months? No.  Has the patient been a risk to others within the distant past? No.   Prior Inpatient Therapy: Yes ([facility] x multiple times), [facility] in [place], [facility].  Prior Outpatient Therapy: Yes.  Alcohol Screening: 1. How often do you have a drink containing alcohol?: 2 to 3 times a week 2. How many drinks containing alcohol do you have on a typical day when you are drinking?: 5 or 6 3. How often do you have six or more drinks on one occasion?: Less than monthly AUDIT-C Score: 6 4. How often during the last year have you found that you were not able to stop drinking once you had started?: Monthly 5. How often during the last year have you failed to do what was normally expected from you because of drinking?: Never 6. How often during the last year have you needed a first drink in the morning to get yourself going after a heavy drinking session?: Less than monthly 7. How often during the last year have you had a feeling of guilt of remorse after drinking?: Monthly 8. How often during the last year have you been unable to remember what happened the night before because you had been drinking?: Less than monthly 9. Have you or someone else been injured as a result of your drinking?: Yes, during the last year 10. Has a relative or friend or a doctor or another health worker been concerned about your drinking or suggested you cut down?: Yes, during the last year Alcohol Use Disorder Identification Test Final Score (AUDIT): 20 Alcohol Brief Interventions/Follow-up: Alcohol education/Brief advice  Substance Abuse History in the last 12 months:  Yes.    Consequences of Substance Abuse: Discussed witg patient during this admission evaluation. Medical Consequences:  Liver damage, Possible death by overdose Legal Consequences:  Arrests, jail time, Loss of driving privilege. Family Consequences:  Family discord, divorce and or separation.   Previous Psychotropic Medications: Yes   Psychological Evaluations: Yes   Past Medical History:  Past Medical History:  Diagnosis Date   Alcohol abuse    Anxiety    Bipolar 2 disorder (HCC)    Depression      Past Surgical History:  Procedure Laterality Date   HAND SURGERY Right    Family History:  Family History  Problem Relation Age of Onset   Anxiety disorder Mother    Alcohol abuse Father    Anxiety disorder Maternal Aunt    Anxiety disorder Maternal Grandmother    Alcohol abuse Paternal Grandfather    Family Psychiatric  History: See H&P  Tobacco Screening:   Social History:  Social History   Substance and Sexual Activity  Alcohol Use Yes   Alcohol/week: 2.0 standard drinks   Types: 2 Cans of beer per week   Comment: 48 beers a week     Social History   Substance and Sexual Activity  Drug Use Not Currently   Types: Marijuana, Cocaine   Comment: cocaine in last month    Additional Social History:  Allergies:  No  Known Allergies  Lab Results:  Results for orders placed or performed during the hospital encounter of 05/10/22 (from the past 48 hour(s))  Resp Panel by RT-PCR (Flu A&B, Covid) Nasopharyngeal Swab     Status: None   Collection Time: 05/12/22  4:56 PM   Specimen: Nasopharyngeal Swab; Nasopharyngeal(NP) swabs in vial transport medium  Result Value Ref Range   SARS Coronavirus 2 by RT PCR NEGATIVE NEGATIVE    Comment: (NOTE) SARS-CoV-2 target nucleic acids are NOT DETECTED.  The SARS-CoV-2 RNA is generally detectable in upper respiratory specimens during the acute phase of infection. The lowest concentration of SARS-CoV-2 viral copies this assay can detect is 138 copies/mL. A negative result does not preclude SARS-Cov-2 infection and should not be used as the sole basis for treatment or other patient management decisions. A negative result may occur with  improper specimen collection/handling, submission of specimen other than nasopharyngeal swab, presence of viral mutation(s) within the areas targeted by this assay, and inadequate number of viral copies(<138 copies/mL). A negative result must be combined with clinical observations, patient history, and  epidemiological information. The expected result is Negative.  Fact Sheet for Patients:  BloggerCourse.com  Fact Sheet for Healthcare Providers:  SeriousBroker.it  This test is no t yet approved or cleared by the [place] FDA and  has been authorized for detection and/or diagnosis of SARS-CoV-2 by FDA under an Emergency Use Authorization (EUA). This EUA will remain  in effect (meaning this test can be used) for the duration of the COVID-19 declaration under Section 564(b)(1) of the Act, 21 U.S.C.section 360bbb-3(b)(1), unless the authorization is terminated  or revoked sooner.       Influenza A by PCR NEGATIVE NEGATIVE   Influenza B by PCR NEGATIVE NEGATIVE    Comment: (NOTE) The Xpert Xpress SARS-CoV-2/FLU/RSV plus assay is intended as an aid in the diagnosis of influenza from Nasopharyngeal swab specimens and should not be used as a sole basis for treatment. Nasal washings and aspirates are unacceptable for Xpert Xpress SARS-CoV-2/FLU/RSV testing.  Fact Sheet for Patients: BloggerCourse.com  Fact Sheet for Healthcare Providers: SeriousBroker.it  This test is not yet approved or cleared by the [place] FDA and has been authorized for detection and/or diagnosis of SARS-CoV-2 by FDA under an Emergency Use Authorization (EUA). This EUA will remain in effect (meaning this test can be used) for the duration of the COVID-19 declaration under Section 564(b)(1) of the Act, 21 U.S.C. section 360bbb-3(b)(1), unless the authorization is terminated or revoked.  Performed at [facility], [address], [place], [place] [address]    Blood Alcohol level:  Lab Results  Component Value Date   ETH 377 [facility]) 05/10/2022   ETH 364 (HH) 11/25/2021   Metabolic Disorder Labs:  Lab Results  Component Value Date   HGBA1C 5.2 10/25/2021   MPG 102.54 10/25/2021    No results found for: PROLACTIN Lab Results  Component Value Date   CHOL 208 (H) 10/25/2021   TRIG 218 (H) 10/25/2021   HDL 71 10/25/2021   CHOLHDL 2.9 10/25/2021   VLDL 44 (H) 10/25/2021   LDLCALC 93 10/25/2021   Current Medications: Current Facility-Administered Medications  Medication Dose Route Frequency Provider Last Rate Last Admin   carbamazepine (TEGRETOL) chewable tablet 100 mg  100 mg Oral TID Armandina Stammer I, NP       DULoxetine (CYMBALTA) DR capsule 40 mg  40 mg Oral Daily Armandina Stammer I, NP       gabapentin (  NEURONTIN) capsule 300 mg  300 mg Oral TID Armandina Stammer I, NP       hydrOXYzine (ATARAX) tablet 50 mg  50 mg Oral TID PRN Armandina Stammer I, NP       multivitamin with minerals tablet 1 tablet  1 tablet Oral Daily Johnica Armwood I, NP       nicotine (NICODERM CQ - dosed in mg/24 hours) patch 14 mg  14 mg Transdermal Daily Ajibola, Ene A, NP   14 mg at 05/13/22 0900   pantoprazole (PROTONIX) EC tablet 40 mg  40 mg Oral Daily Hilmer Aliberti I, NP       traZODone (DESYREL) tablet 100 mg  100 mg Oral QHS PRN Armandina Stammer I, NP       PTA Medications: Medications Prior to Admission  Medication Sig Dispense Refill Last Dose   carbamazepine (TEGRETOL) 100 MG chewable tablet Chew 1 tablet (100 mg total) by mouth 3 (three) times daily. (Patient not taking: Reported on 05/11/2022) 90 tablet 0    dicyclomine (BENTYL) 10 MG capsule Take 1 capsule (10 mg total) by mouth 4 (four) times daily -  before meals and at bedtime. (Patient not taking: Reported on 05/11/2022) 40 capsule 0    DULoxetine HCl 40 MG CPEP Take 40 mg by mouth daily. (Patient not taking: Reported on 05/11/2022) 30 capsule 0    gabapentin (NEURONTIN) 300 MG capsule Take 1 capsule (300 mg total) by mouth 3 (three) times daily. (Patient not taking: Reported on 05/11/2022) 90 capsule 0    hydrOXYzine (ATARAX/VISTARIL) 50 MG tablet Take 1 tablet (50 mg total) by mouth 3 (three) times daily as needed for anxiety. (Patient not  taking: Reported on 05/11/2022) 30 tablet 0    Multiple Vitamin (MULTIVITAMIN WITH MINERALS) TABS tablet Take 1 tablet by mouth daily.      pantoprazole (PROTONIX) 40 MG tablet Take 1 tablet (40 mg total) by mouth daily. (Patient not taking: Reported on 05/11/2022) 30 tablet 0    traZODone (DESYREL) 100 MG tablet Take 1 tablet (100 mg total) by mouth at bedtime as needed for sleep. (Patient not taking: Reported on 05/11/2022) 30 tablet 0    Musculoskeletal: Strength & Muscle Tone: within normal limits Gait & Station: normal Patient leans: N/A  Psychiatric Specialty Exam:  Presentation  General Appearance: Appropriate for Environment; Disheveled  Eye Contact:Good  Speech:Clear and Coherent; Normal Rate  Speech Volume:Normal  Handedness:Right  Mood and Affect  Mood:Anxious; Depressed  Affect:Non-Congruent  Thought Process  Thought Processes:Coherent; Goal Directed  Duration of Psychotic Symptoms: No data recorded Past Diagnosis of Schizophrenia or Psychoactive disorder: No  Descriptions of Associations:Intact  Orientation:Full (Time, Place and Person)  Thought Content:Logical  Hallucinations:Hallucinations: None   Ideas of Reference:None  Suicidal Thoughts:Suicidal Thoughts: No   Homicidal Thoughts:Homicidal Thoughts: No  Sensorium  Memory:Immediate Good; Recent Good; Remote Good  Judgment:Good  Insight:Good  Executive Functions  Concentration:Good  Attention Span:Good  Recall:Good  Fund of Knowledge:Fair  Language:Good  Psychomotor Activity  Psychomotor Activity:Psychomotor Activity: Normal  Assets  Assets:Communication Skills; Desire for Improvement; Housing; Social Support; Physical Health  Sleep  Sleep:Sleep: Poor Number of Hours of Sleep: 3.5   Physical Exam: Physical Exam Vitals and nursing note reviewed.  HENT:     Head: Normocephalic and atraumatic.     Mouth/Throat:     Pharynx: Oropharynx is clear.  Eyes:     Pupils: Pupils  are equal, round, and reactive to light.  Pulmonary:     Effort: Pulmonary  effort is normal.  Genitourinary:    Comments: Deferred Musculoskeletal:        General: Normal range of motion.  Skin:    General: Skin is warm.  Neurological:     General: No focal deficit present.     Mental Status: He is alert and oriented to person, place, and time.   Review of Systems  Constitutional:  Negative for chills and fever.  HENT: Negative.  Negative for congestion and sore throat.   Respiratory:  Negative for cough, shortness of breath and wheezing.   Cardiovascular:  Negative for chest pain and palpitations.  Gastrointestinal:  Negative for abdominal pain, constipation, diarrhea, heartburn, nausea and vomiting.  Skin: Negative.   Neurological:  Positive for seizures. Negative for dizziness, tingling, tremors, sensory change, speech change, focal weakness, loss of consciousness, weakness and headaches.  Endo/Heme/Allergies:        NKDA  Psychiatric/Behavioral:  Positive for depression and substance abuse. Negative for hallucinations, memory loss and suicidal ideas. The patient is nervous/anxious and has insomnia.   Blood pressure 116/83, pulse 88, temperature 98 F (36.7 C), resp. rate 18, height 6' 0.1" (1.831 m), weight 83 kg, SpO2 92 %. Body mass index is 24.75 kg/m.  Treatment Plan Summary: Daily contact with patient to assess and evaluate symptoms and progress in treatment, Medication management, and Plan    Treatment Plan/Recommendations: 1. Admit for crisis management and stabilization, estimated length of stay 3-5 days.   2. Medication management to reduce current symptoms to base line and improve the patient's overall level of functioning: See [facility] for plan of care.   Plan. - Continue Tegretol chewable tabs 100 mg po tid for seizure disorder.  - Continue Duloxetine 40 mg po daily for depression.  - Continue gabapentin 300 mg po tid for alcohol withdrawal syndrome.  - Hydroxyzine  50 mg po tid for anxiety.  - Continue Protonix 40 mg po Q am for GERD.  - Continue Trazodone 100 mg po Q hs prn for insomnia.  - Continue nicotine patch 14 mg trans-dermally Q 24 hrs for nicotine withdrawal management.  - Continue multivitamin 1 tablet po daily for vitamin supplementation.  3. Treat health problems as indicated.  4. Develop treatment plan to decrease risk of relapse upon discharge and the need for readmission.  5. Psycho-social education regarding relapse prevention and self care.  6. Health care follow up as needed for medical problems.  7. Review, reconcile, and reinstate any pertinent home medications for other health issues where appropriate. 8. Call for consults with hospitalist for any additional specialty patient care services as needed.   Observation Level/Precautions:  Detox 15 minute checks  Laboratory:   Reviewed current lab results, will obtain, u/a, lipid panel, hgba1c, tsh.  Psychotherapy: Group sessions.  Medications: See [facility]  Consultations: As needed.    Discharge Concerns: Safety, mood stability, maintaining sobriety    Estimated LOS: 2-4 days.  Other: NA    Physician Treatment Plan for Primary Diagnosis: Bipolar affective disorder (HCC) Long Term Goal(s): Improvement in symptoms so as ready for discharge  Short Term Goals: Ability to identify changes in lifestyle to reduce recurrence of condition will improve, Ability to verbalize feelings will improve, Ability to disclose and discuss suicidal ideas, and Ability to demonstrate self-control will improve  Physician Treatment Plan for Secondary Diagnosis: Principal Problem:   Bipolar affective disorder (HCC)  Long Term Goal(s): Improvement in symptoms so as ready for discharge  Short Term Goals: Ability to identify and develop  effective coping behaviors will improve, Ability to maintain clinical measurements within normal limits will improve, Compliance with prescribed medications will improve, and  Ability to identify triggers associated with substance abuse/mental health issues will improve  I certify that inpatient services furnished can reasonably be expected to improve the patient's condition.    Armandina StammerAgnes Truth Wolaver, NP, pmhnp, fnp-bc 5/21/20232:30 PM

## 2022-05-13 NOTE — ED Notes (Signed)
Pt left with Sheriff from transport to [facility]. [name] papers sent with deputy.

## 2022-05-13 NOTE — [facility] Group Notes (Signed)
.  Psychoeducational Group Note    Date:  5/21//23 Time: 1300-1400    Purpose of Group: . The group focus' on teaching patients on how to identify their needs and their Life Skills:  A group where two lists are made. What people need and what are things that we do that are unhealthy. The lists are developed by the patients and it is explained that we often do the actions that are not healthy to get our list of needs met.  Goal:: to develop the coping skills needed to get their needs met  Participation Level: did not attend  Jamille Yoshino A  

## 2022-05-13 NOTE — Progress Notes (Signed)
   05/13/22 2200  Psych Admission Type (Psych Patients Only)  Admission Status Involuntary  Psychosocial Assessment  Patient Complaints Depression  Eye Contact Fair  Facial Expression Anxious  Affect Appropriate to circumstance  Speech Logical/coherent  Interaction Assertive  Motor Activity Slow  Appearance/Hygiene Unremarkable  Behavior Characteristics Cooperative;Appropriate to situation  Mood Depressed  Thought Process  Coherency WDL  Content WDL  Delusions None reported or observed  Perception WDL  Hallucination None reported or observed  Judgment Poor  Confusion None  Danger to Self  Current suicidal ideation? Denies  Agreement Not to Harm Self Yes  Description of Agreement verbal  Danger to Others  Danger to Others None reported or observed

## 2022-05-13 NOTE — Progress Notes (Signed)
D:  Patient's self inventory sheet, patient has fair sleep, sleep medicaiton helpful.  Good appetite, normal energy level, good concentration.  Rated depression and hopeless 6, anxiety 7.  Withdrawals, tremors, chills, cravings, agitation, irritability.  Denied SI.  Denied physical problems.   A:  Medications administered per MD orders.  Emotional support and encouragement given patient. R:  Denied SI and HI, contracts for safety.  Denied A/V hallucinations.  Safety maintained with 15 minute checks.
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
## 2022-05-13 NOTE — Plan of Care (Signed)
Nurse discussed anxiety, depression and coping skills with patient.  

## 2022-05-14 ENCOUNTER — Encounter (HOSPITAL_COMMUNITY): Payer: Self-pay

## 2022-05-14 DIAGNOSIS — F319 Bipolar disorder, unspecified: Secondary | ICD-10-CM

## 2022-05-14 DIAGNOSIS — F3130 Bipolar disorder, current episode depressed, mild or moderate severity, unspecified: Principal | ICD-10-CM

## 2022-05-14 LAB — HEMOGLOBIN A1C
Hgb A1c MFr Bld: 5.4 % (ref 4.8–5.6)
Mean Plasma Glucose: 108.28 mg/dL

## 2022-05-14 LAB — LIPID PANEL
Cholesterol: 196 mg/dL (ref 0–200)
HDL: 57 mg/dL (ref >40)
LDL Cholesterol: 120 mg/dL — ABNORMAL HIGH (ref 0–99)
Total CHOL/HDL Ratio: 3.4 RATIO
Triglycerides: 93 mg/dL (ref <150)
VLDL: 19 mg/dL (ref 0–40)

## 2022-05-14 LAB — TSH: TSH: 2.724 u[IU]/mL (ref 0.350–4.500)

## 2022-05-14 MED ORDER — LORAZEPAM 1 MG PO TABS: 1.0000 mg | ORAL_TABLET | Freq: Four times a day (QID) | ORAL | Status: DC

## 2022-05-14 MED ORDER — LORAZEPAM 1 MG PO TABS: 1.0000 mg | ORAL_TABLET | Freq: Two times a day (BID) | ORAL | Status: DC

## 2022-05-14 MED ORDER — LORAZEPAM 1 MG PO TABS
1.0000 mg | ORAL_TABLET | Freq: Three times a day (TID) | ORAL | Status: AC
  Administered 2022-05-15 (×3): 1 mg via ORAL
  Filled 2022-05-14 (×3): qty 1

## 2022-05-14 MED ORDER — LORAZEPAM 1 MG PO TABS
1.0000 mg | ORAL_TABLET | Freq: Every day | ORAL | Status: AC
  Administered 2022-05-17: 1 mg via ORAL
  Filled 2022-05-14: qty 1

## 2022-05-14 MED ORDER — THIAMINE HCL 100 MG/ML IJ SOLN
100.0000 mg | Freq: Once | INTRAMUSCULAR | Status: AC
  Administered 2022-05-14: 100 mg via INTRAMUSCULAR
  Filled 2022-05-14: qty 2

## 2022-05-14 MED ORDER — LORAZEPAM 1 MG PO TABS: 1.0000 mg | ORAL_TABLET | Freq: Every day | ORAL | Status: DC

## 2022-05-14 MED ORDER — ONDANSETRON 4 MG PO TBDP: 4.0000 mg | ORAL_TABLET | Freq: Four times a day (QID) | ORAL | Status: AC | PRN

## 2022-05-14 MED ORDER — LORAZEPAM 1 MG PO TABS
1.0000 mg | ORAL_TABLET | Freq: Four times a day (QID) | ORAL | Status: AC
  Administered 2022-05-14 (×3): 1 mg via ORAL
  Filled 2022-05-14 (×3): qty 1

## 2022-05-14 MED ORDER — LORAZEPAM 1 MG PO TABS
1.0000 mg | ORAL_TABLET | Freq: Two times a day (BID) | ORAL | Status: AC
  Administered 2022-05-16 (×2): 1 mg via ORAL
  Filled 2022-05-14 (×2): qty 1

## 2022-05-14 MED ORDER — LORAZEPAM 1 MG PO TABS: 1.0000 mg | ORAL_TABLET | Freq: Three times a day (TID) | ORAL | Status: DC

## 2022-05-14 MED ORDER — LOPERAMIDE HCL 2 MG PO CAPS: 2.0000 mg | ORAL_CAPSULE | ORAL | Status: AC | PRN

## 2022-05-14 MED ORDER — ADULT MULTIVITAMIN W/MINERALS CH
1.0000 | ORAL_TABLET | Freq: Every day | ORAL | Status: DC
  Administered 2022-05-15: 1 via ORAL
  Filled 2022-05-14 (×4): qty 1

## 2022-05-14 MED ORDER — THIAMINE HCL 100 MG PO TABS
100.0000 mg | ORAL_TABLET | Freq: Every day | ORAL | Status: DC
  Administered 2022-05-15 – 2022-05-18 (×4): 100 mg via ORAL
  Filled 2022-05-14 (×6): qty 1

## 2022-05-14 MED ORDER — LORAZEPAM 1 MG PO TABS: 1.0000 mg | ORAL_TABLET | Freq: Four times a day (QID) | ORAL | Status: AC | PRN

## 2022-05-14 NOTE — [facility] Group Notes (Signed)
Pt attended AA group 

## 2022-05-14 NOTE — Progress Notes (Signed)
Adult Psychoeducational Group Note  Date:  05/14/2022 Time:  12:58 PM  Group Topic/Focus:  Goals Group:   The focus of this group is to help patients establish daily goals to achieve during treatment and discuss how the patient can incorporate goal setting into their daily lives to aide in recovery.  Participation Level:  Active  Participation Quality:  Appropriate  Affect:  Appropriate  Cognitive:  Appropriate  Insight: Appropriate  Engagement in Group:  Engaged  Modes of Intervention:  Discussion  Additional CommentsPatient attended morning orientation/goal's group and said that his goal for today is to stay positive.   Geraldy Akridge W Teandra Harlan 05/14/2022, 12:58 PM
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
## 2022-05-14 NOTE — Group Note (Signed)
Recreation Therapy Group Note   Group Topic:Stress Management  Group Date: 05/14/2022 Start Time: 0940 End Time: 0953 Facilitators: Caroll Rancher, LRT,CTRS Location: [address] Dayroom   Goal Area(s) Addresses:  Patient will actively participate in stress management techniques presented during session.  Patient will successfully identify benefit of practicing stress management post d/c.   Group Description: Guided Imagery. LRT provided education, instruction, and demonstration on practice of visualization via guided imagery. Patient was asked to participate in the technique introduced during session. LRT debriefed including topics of mindfulness, stress management and specific scenarios each patient could use these techniques. Patients were given suggestions of ways to access scripts post d/c and encouraged to explore Youtube and other apps available on smartphones, tablets, and computers.   Affect/Mood: N/A   Participation Level: Did not attend    Clinical Observations/Individualized Feedback:     Plan: Continue to engage patient in RT group sessions 2-3x/week.   Caroll Rancher, LRT,CTRS 05/14/2022 11:18 AM

## 2022-05-14 NOTE — [facility] Suicide Risk Assessment (Signed)
[facility] INPATIENT:  Family/Significant Other Suicide Prevention Education   Suicide Prevention Education:  Contact Attempts: Jahsiah Carpenter, Mother, [phone] has been identified by the patient as the family member/significant other with whom the patient will be residing, and identified as the person(s) who will aid the patient in the event of a mental health crisis.  With written consent from the patient, two attempts were made to provide suicide prevention education, prior to and/or following the patient's discharge.  We were unsuccessful in providing suicide prevention education.  A suicide education pamphlet was given to the patient to share with family/significant other.   Date and time of first attempt: 05/13/2022 @ 3:05 PM  Date and time of second attempt: 05/14/2022 @ 12:34 PM  CSW team will make final attempt to reach John Vaughan to complete SPE on 05/15/2022.   Signed:  Corky Crafts, MSW, [place], LCAS 05/14/2022 12:34 PM

## 2022-05-14 NOTE — BH IP Treatment Plan (Signed)
Interdisciplinary Treatment and Diagnostic Plan Update  05/14/2022 Time of Session: 9:30am John Vaughan MRN: [id number]  Principal Diagnosis: Bipolar affective disorder [facility](HCC)  Secondary Diagnoses: Principal Problem:   Bipolar affective disorder (HCC) Active Problems:   GAD (generalized anxiety disorder)   Alcohol use disorder, severe, dependence (HCC)   Alcohol withdrawal (HCC)   Cocaine use disorder (HCC)   Current Medications:  Current Facility-Administered Medications  Medication Dose Route Frequency Provider Last Rate Last Admin   carbamazepine (TEGRETOL) chewable tablet 100 mg  100 mg Oral TID Armandina StammerNwoko, Agnes I, NP   100 mg at 05/14/22 0739   DULoxetine (CYMBALTA) DR capsule 40 mg  40 mg Oral Daily Armandina StammerNwoko, Agnes I, NP   40 mg at 05/14/22 0739   gabapentin (NEURONTIN) capsule 300 mg  300 mg Oral TID Armandina StammerNwoko, Agnes I, NP   300 mg at 05/14/22 [id number]   hydrOXYzine (ATARAX) tablet 50 mg  50 mg Oral TID PRN Armandina StammerNwoko, Agnes I, NP   50 mg at 05/13/22 2112   loperamide (IMODIUM) capsule 2-4 mg  2-4 mg Oral PRN Starleen BlueNkwenti, Doris, NP       LORazepam (ATIVAN) tablet 1 mg  1 mg Oral Q6H PRN Starleen BlueNkwenti, Doris, NP       LORazepam (ATIVAN) tablet 1 mg  1 mg Oral QID Hill, Shelbie HutchingStephanie Leigh, MD       Followed by   Melene Muller[START ON 05/15/2022] LORazepam (ATIVAN) tablet 1 mg  1 mg Oral TID Roselle LocusHill, Stephanie Leigh, MD       Followed by   Melene Muller[START ON 05/16/2022] LORazepam (ATIVAN) tablet 1 mg  1 mg Oral BID Hill, Shelbie HutchingStephanie Leigh, MD       Followed by   Melene Muller[START ON 05/17/2022] LORazepam (ATIVAN) tablet 1 mg  1 mg Oral Daily Hill, Shelbie HutchingStephanie Leigh, MD       multivitamin with minerals tablet 1 tablet  1 tablet Oral Daily Armandina StammerNwoko, Agnes I, NP   1 tablet at 05/14/22 0740   multivitamin with minerals tablet 1 tablet  1 tablet Oral Daily Nkwenti, Doris, NP       nicotine (NICODERM CQ - dosed in mg/24 hours) patch 14 mg  14 mg Transdermal Daily Ajibola, Ene A, NP   14 mg at 05/14/22 0738   ondansetron (ZOFRAN-ODT) disintegrating  tablet 4 mg  4 mg Oral Q6H PRN Starleen BlueNkwenti, Doris, NP       pantoprazole (PROTONIX) EC tablet 40 mg  40 mg Oral Daily Armandina StammerNwoko, Agnes I, NP   40 mg at 05/14/22 [id number]   thiamine (B-1) injection 100 mg  100 mg Intramuscular Once Starleen BlueNkwenti, Doris, NP       Melene Muller[START ON 05/15/2022] thiamine tablet 100 mg  100 mg Oral Daily Starleen BlueNkwenti, Doris, NP       traZODone (DESYREL) tablet 100 mg  100 mg Oral QHS PRN Armandina StammerNwoko, Agnes I, NP   100 mg at 05/13/22 2112   PTA Medications: Medications Prior to Admission  Medication Sig Dispense Refill Last Dose   carbamazepine (TEGRETOL) 100 MG chewable tablet Chew 1 tablet (100 mg total) by mouth 3 (three) times daily. (Patient not taking: Reported on 05/11/2022) 90 tablet 0    dicyclomine (BENTYL) 10 MG capsule Take 1 capsule (10 mg total) by mouth 4 (four) times daily -  before meals and at bedtime. (Patient not taking: Reported on 05/11/2022) 40 capsule 0    DULoxetine HCl 40 MG CPEP Take 40 mg by mouth daily. (Patient not taking: Reported on 05/11/2022) 30 capsule  0    gabapentin (NEURONTIN) 300 MG capsule Take 1 capsule (300 mg total) by mouth 3 (three) times daily. (Patient not taking: Reported on 05/11/2022) 90 capsule 0    hydrOXYzine (ATARAX/VISTARIL) 50 MG tablet Take 1 tablet (50 mg total) by mouth 3 (three) times daily as needed for anxiety. (Patient not taking: Reported on 05/11/2022) 30 tablet 0    Multiple Vitamin (MULTIVITAMIN WITH MINERALS) TABS tablet Take 1 tablet by mouth daily.      pantoprazole (PROTONIX) 40 MG tablet Take 1 tablet (40 mg total) by mouth daily. (Patient not taking: Reported on 05/11/2022) 30 tablet 0    traZODone (DESYREL) 100 MG tablet Take 1 tablet (100 mg total) by mouth at bedtime as needed for sleep. (Patient not taking: Reported on 05/11/2022) 30 tablet 0     Patient Stressors: Financial difficulties   Marital or family conflict   Medication change or noncompliance   Occupational concerns   Substance abuse    Patient Strengths: Active sense of  humor  Communication skills  Physical Health  Supportive family/friends   Treatment Modalities: Medication Management, Group therapy, Case management,  1 to 1 session with clinician, Psychoeducation, Recreational therapy.   Physician Treatment Plan for Primary Diagnosis: Bipolar affective disorder (HCC) Long Term Goal(s): Improvement in symptoms so as ready for discharge   Short Term Goals: Ability to identify and develop effective coping behaviors will improve Ability to maintain clinical measurements within normal limits will improve Compliance with prescribed medications will improve Ability to identify triggers associated with substance abuse/mental health issues will improve Ability to identify changes in lifestyle to reduce recurrence of condition will improve Ability to verbalize feelings will improve Ability to disclose and discuss suicidal ideas Ability to demonstrate self-control will improve  Medication Management: Evaluate patient's response, side effects, and tolerance of medication regimen.  Therapeutic Interventions: 1 to 1 sessions, Unit Group sessions and Medication administration.  Evaluation of Outcomes: Progressing  Physician Treatment Plan for Secondary Diagnosis: Principal Problem:   Bipolar affective disorder (HCC) Active Problems:   GAD (generalized anxiety disorder)   Alcohol use disorder, severe, dependence (HCC)   Alcohol withdrawal (HCC)   Cocaine use disorder (HCC)  Long Term Goal(s): Improvement in symptoms so as ready for discharge   Short Term Goals: Ability to identify and develop effective coping behaviors will improve Ability to maintain clinical measurements within normal limits will improve Compliance with prescribed medications will improve Ability to identify triggers associated with substance abuse/mental health issues will improve Ability to identify changes in lifestyle to reduce recurrence of condition will improve Ability to  verbalize feelings will improve Ability to disclose and discuss suicidal ideas Ability to demonstrate self-control will improve     Medication Management: Evaluate patient's response, side effects, and tolerance of medication regimen.  Therapeutic Interventions: 1 to 1 sessions, Unit Group sessions and Medication administration.  Evaluation of Outcomes: Progressing   RN Treatment Plan for Primary Diagnosis: Bipolar affective disorder (HCC) Long Term Goal(s): Knowledge of disease and therapeutic regimen to maintain health will improve  Short Term Goals: Ability to remain free from injury will improve, Ability to verbalize frustration and anger appropriately will improve, Ability to demonstrate self-control, Ability to participate in decision making will improve, Ability to verbalize feelings will improve, Ability to disclose and discuss suicidal ideas, Ability to identify and develop effective coping behaviors will improve, and Compliance with prescribed medications will improve  Medication Management: RN will administer medications as ordered by provider, will assess  and evaluate patient's response and provide education to patient for prescribed medication. RN will report any adverse and/or side effects to prescribing provider.  Therapeutic Interventions: 1 on 1 counseling sessions, Psychoeducation, Medication administration, Evaluate responses to treatment, Monitor vital signs and CBGs as ordered, Perform/monitor CIWA, COWS, AIMS and Fall Risk screenings as ordered, Perform wound care treatments as ordered.  Evaluation of Outcomes: Progressing   LCSW Treatment Plan for Primary Diagnosis: Bipolar affective disorder (HCC) Long Term Goal(s): Safe transition to appropriate next level of care at discharge, Engage patient in therapeutic group addressing interpersonal concerns.  Short Term Goals: Engage patient in aftercare planning with referrals and resources, Increase social support, Increase  ability to appropriately verbalize feelings, Increase emotional regulation, Facilitate acceptance of mental health diagnosis and concerns, Facilitate patient progression through stages of change regarding substance use diagnoses and concerns, Identify triggers associated with mental health/substance abuse issues, and Increase skills for wellness and recovery  Therapeutic Interventions: Assess for all discharge needs, 1 to 1 time with Social worker, Explore available resources and support systems, Assess for adequacy in community support network, Educate family and significant other(s) on suicide prevention, Complete Psychosocial Assessment, Interpersonal group therapy.  Evaluation of Outcomes: Progressing   Progress in Treatment: Attending groups: Yes. Participating in groups: Yes. Taking medication as prescribed: Yes. Toleration medication: Yes. Family/Significant other contact made: No, will contact:  Mother, [name] Patient understands diagnosis: Yes. Discussing patient identified problems/goals with staff: Yes. Medical problems stabilized or resolved: Yes. Denies suicidal/homicidal ideation: Yes. Issues/concerns per patient self-inventory: No. Other: none reported   New problem(s) identified: No, Describe:  none reported   New Short Term/Long Term Goal(s):   detox, medication management for mood stabilization; elimination of SI thoughts; development of comprehensive mental wellness/sobriety plan     Patient Goals:  Patient states, " I would like to stay positive."  Discharge Plan or Barriers: Patient recently admitted. CSW will continue to follow and assess for appropriate referrals and possible discharge planning.    Reason for Continuation of Hospitalization: Aggression Anxiety Depression Medication stabilization Withdrawal symptoms  Estimated Length of Stay: 3-5 days  Last 3 [place] Suicide Severity Risk Score: Flowsheet Row [facility] (Current[facility] 05[facility]10/2022 [facility] from 05/01/2022 in [facility]  C-SSRS RISK CATEGORY No Risk No Risk No Risk       Last PHQ 2/9 Scores:    04/12/2021    2:51 PM 02/09/2019    2:22 PM  Depression screen PHQ 2/9  Decreased Interest 2 2  Down, Depressed, Hopeless 3 3  PHQ - 2 Score 5 5  Altered sleeping 3 3  Tired, decreased energy 2 2  Change in appetite 2 2  Feeling bad or failure about yourself  3 3  Trouble concentrating 3 2  Moving slowly or fidgety/restless 3 2  Suicidal thoughts 0 1  PHQ-9 Score 21 20    Scribe for Treatment Team: Beatris Si, LCSW 05/14/2022 10:37 AM

## 2022-05-14 NOTE — Group Note (Signed)
[facility] LCSW Group Therapy Note   Group Date: 05/14/2022 Start Time: 1300 End Time: 1400   Type of Therapy/Topic:  Group Therapy:  Emotion Regulation  Participation Level:  Minimal   Mood: Euthymic   Description of Group:    The purpose of this group is to assist patients in learning to regulate negative emotions and experience positive emotions. Patients will be guided to discuss ways in which they have been vulnerable to their negative emotions. These vulnerabilities will be juxtaposed with experiences of positive emotions or situations, and patients challenged to use positive emotions to combat negative ones. Special emphasis will be placed on coping with negative emotions in conflict situations, and patients will process healthy conflict resolution skills.  Therapeutic Goals: Patient will identify two positive emotions or experiences to reflect on in order to balance out negative emotions:  Patient will label two or more emotions that they find the most difficult to experience:  Patient will be able to demonstrate positive conflict resolution skills through discussion or role plays:   Summary of Patient Progress:   Patient was present for the entirety of group session. Patient participated in opening and closing remarks. However, patient did not contribute at all to the topic of discussion despite encouraged participation.     Therapeutic Modalities:   Cognitive Behavioral Therapy Feelings Identification Dialectical Behavioral Therapy   Laverta Harnisch W Lemario Chaikin, LCSWA 

## 2022-05-14 NOTE — [facility] Group Notes (Signed)
  Spiritual care group on grief and loss facilitated by chaplain Janne Napoleon, [facility]   Group Goal:   Support / Education around grief and loss   Members engage in facilitated group support and psycho-social education.   Group Description:   Following introductions and group rules, group members engaged in facilitated group dialog and support around topic of loss, with particular support around experiences of loss in their lives. Group Identified types of loss (relationships / self / things) and identified patterns, circumstances, and changes that precipitate losses. Reflected on thoughts / feelings around loss, normalized grief responses, and recognized variety in grief experience. Group noted Worden's four tasks of grief in discussion.   Group drew on Adlerian / Rogerian, narrative, MI,   Patient Progress: John Vaughan attended group and actively participated and engaged in group conversation.  His comments were minimal, but showed engagement through body language as he listened to his peers share.  [address], [place] Pager, [phone]

## 2022-05-14 NOTE — Group Note (Signed)
Occupational Therapy Group Note  Group Topic:Communication  Group Date: 05/14/2022 Start Time: 1400 End Time: 1500 Facilitators: Brantley Stage, OT   Group Description: Group encouraged increased engagement and participation through discussion focused on communication styles. Patients were educated on the different styles of communication including passive, aggressive, assertive, and passive-aggressive communication. Group members shared and reflected on which styles they most often find themselves communicating in and brainstormed strategies on how to transition and practice a more assertive approach. Further discussion explored how to use assertiveness skills and strategies to further advocate and ask questions as it relates to their treatment plan and mental health.   Therapeutic Goal(s): Identify practical strategies to improve communication skills  Identify how to use assertive communication skills to address individual needs and wants   Participation Level: Active and Engaged   Participation Quality: Independent   Behavior: Alert and Appropriate   Speech/Thought Process: Coherent and Directed   Affect/Mood: Appropriate   Insight: Fair   Judgement: Fair   Individualization: Pt was active in their participation of group discussion/activity. New skills were identified  Modes of Intervention: Activity / education / discussion   Patient Response to Interventions:  Attentive, Interested , and Receptive   Plan: Continue to engage patient in OT groups 2 - 3x/week.  05/14/2022  Brantley Stage, OT Cornell Barman, OT

## 2022-05-14 NOTE — Progress Notes (Addendum)
D:  Patient's self inventory sheet , patient sleeps good, sleep medication helpful.  Good appetite, high energy level, poor concentration.  Rated depression, hopeless and anxiety 6.  Withdrawals, chilling, cravings, agitation, irritability.  Denied SI.  Denied physical problems.  Denied physical pain.  Goal is stay calm.  Plans to not over think.  No discharge plans.  Family may keep him from following discharge plans. A:  Medications administered per MD orders.  Emotional support and encouragement given patient.   R:  Denied SI and HI, contracts for safety.  Denied A/V hallucinations.  Safety maintained with 15 minute checks.  Patient stated he slept about 7 hours last night.

## 2022-05-14 NOTE — Plan of Care (Signed)
Nurse discussed anxiety, depression and coping skills with patient.  

## 2022-05-15 NOTE — Progress Notes (Signed)
   05/15/22 0000  Psych Admission Type (Psych Patients Only)  Admission Status Involuntary  Psychosocial Assessment  Patient Complaints Anxiety  Eye Contact Fair  Facial Expression Flat;Anxious  Affect Appropriate to circumstance  Speech Logical/coherent  Interaction Assertive  Motor Activity Pacing  Appearance/Hygiene Improved  Behavior Characteristics Appropriate to situation;Cooperative  Mood Depressed  Thought Process  Coherency WDL  Content WDL  Delusions None reported or observed  Perception WDL  Hallucination None reported or observed  Judgment Poor  Confusion None  Danger to Self  Current suicidal ideation? Denies  Agreement Not to Harm Self Yes  Description of Agreement verbal  Danger to Others  Danger to Others None reported or observed

## 2022-05-15 NOTE — [facility] Group Notes (Signed)
Adult Orientation Group Note  Date:  05/15/2022 Time:  10:51 AM  Group Topic/Focus:  Orientation:   The focus of this group is to educate the patient on the purpose and policies of crisis stabilization and provide a format to answer questions about their admission.  The group details unit policies and expectations of patients while admitted.  Participation Level:  Active  Participation Quality:  Appropriate  Affect:  Appropriate  Cognitive:  Appropriate  Insight: Appropriate  Engagement in Group:  Engaged  Modes of Intervention:  Education    Evalene Vath R Dennie Vecchio 05/15/2022, 10:51 AM 

## 2022-05-15 NOTE — Progress Notes (Signed)
D:  Patient's self inventory sheet, patient sleeps good, sleep medication helpful.  Good appetite, high energy level, good concentration.  Rated depression 6, hopeless 4, anxiety 5.  Withdrawals yes, tremors, cravings.  Denied SI.  Physical problems yes.  Physical pain.  Goal is stay positive.  Plans to talk with friends.  No discharge plans.  Family may be a problem for him after discharge from [facility]. A:  Medications administered per MD orders.  Emotional support and encouragement given patient. R:  Denied SI and HI, contracts for safety.  Denied A/V hallucinations.  Safety maintained with 15 minute checks.

## 2022-05-15 NOTE — Progress Notes (Signed)
Patient rated his day as a 5 out of 10 since both good and bad events took place today. He states that he reached his goal for the day by talking to his mother, but she does not understand his situation.

## 2022-05-15 NOTE — Group Note (Signed)
Recreation Therapy Group Note   Group Topic:Animal Assisted Therapy   Group Date: 05/15/2022 Start Time: 1430 End Time: 1510 Facilitators: Victorino Sparrow, LRT,CTRS Location: [address] Dayroom   Animal-Assisted Activity (AAA) Program Checklist/Progress Note Patient Eligibility Criteria Checklist & Daily Group note for Rec Tx Intervention  AAA/T Program Assumption of Risk Form signed by Patient/ or Parent Legal Guardian YES  Patient is free of allergies or severe asthma  YES  Patient reports no fear of animals YES  Patient reports no history of cruelty to animals YES  Patient understands their participation is voluntary YES  Patient washes hands before animal contact YES  Patient washes hands after animal contact YES   Group Description: Patients provided opportunity to interact with trained and credentialed [employer] Therapy dog and the community volunteer/dog handler. Patients practiced appropriate animal interaction and were educated on dog safety outside of the hospital in common community settings. Patients were allowed to use dog toys and other items to practice commands, engage the dog in play, and/or complete routine aspects of animal care.   Education: Contractor, Pensions consultant, Communication & Social Skills    Affect/Mood: Appropriate   Participation Level: Engaged    Clinical Observations/Individualized Feedback: Pt attended and participated in group session.  Pt engaged with therapy dog team in an appropriate manner.    Plan: Continue to engage patient in RT group sessions 2-3x/week.   Victorino Sparrow, Glennis Brink  05/15/2022 3:39 PM

## 2022-05-15 NOTE — Progress Notes (Signed)
Patient stated he went outside to play basketball.  Came back inside and his arms, hands, legs, ft started shaking again.  This happens in the past to him.
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
## 2022-05-15 NOTE — BHH Suicide Risk Assessment (Signed)
[place] INPATIENT:  Family/Significant Other Suicide Prevention Education  Suicide Prevention Education:  Education Completed; John Vaughan, Mother, [phone] has been identified by the patient as the family member/significant other with whom the patient will be residing, and identified as the person(s) who will aid the patient in the event of a mental health crisis (suicidal ideations/suicide attempt).  With written consent from the patient, the family member/significant other has been provided the following suicide prevention education, prior to the and/or following the discharge of the patient.  States she is concerned about his drinking and causes him to be aggressive. Believes the patient needs to attend a 30 day treatment facility.    The suicide prevention education provided includes the following: Suicide risk factors Suicide prevention and interventions National Suicide Hotline telephone number [facility] assessment telephone number [facility] [place] and/or Residential Mobile Crisis Unit telephone number  Request made of family/significant other to: Remove weapons (e.g., guns, rifles, knives), all items previously/currently identified as safety concern.   Remove drugs/medications (over-the-counter, prescriptions, illicit drugs), all items previously/currently identified as a safety concern.  The family member/significant other verbalizes understanding of the suicide prevention education information provided.  The family member/significant other agrees to remove the items of safety concern listed above.  Durenda Hurt 05/15/2022, 12:11 PM

## 2022-05-15 NOTE — Plan of Care (Signed)
Nurse discussed anxiety, depression and coping skills with patient.  

## 2022-05-16 DIAGNOSIS — F3130 Bipolar disorder, current episode depressed, mild or moderate severity, unspecified: Secondary | ICD-10-CM | POA: Diagnosis not present

## 2022-05-16 NOTE — Group Note (Signed)
LCSW Group Therapy Note   Group Date: 05/16/2022 Start Time: 1300 End Time: 1400   Type of Therapy and Topic:  Group Therapy: Boundaries  Participation Level:  Active  Description of Group: This group will address the use of boundaries in their personal lives. Patients will explore why boundaries are important, the difference between healthy and unhealthy boundaries, and negative and postive outcomes of different boundaries and will look at how boundaries can be crossed.  Patients will be encouraged to identify current boundaries in their own lives and identify what kind of boundary is being set. Facilitators will guide patients in utilizing problem-solving interventions to address and correct types boundaries being used and to address when no boundary is being used. Understanding and applying boundaries will be explored and addressed for obtaining and maintaining a balanced life. Patients will be encouraged to explore ways to assertively make their boundaries and needs known to significant others in their lives, using other group members and facilitator for role play, support, and feedback.  Therapeutic Goals:  1.  Patient will identify areas in their life where setting clear boundaries could be  used to improve their life.  2.  Patient will identify signs/triggers that a boundary is not being respected. 3.  Patient will identify two ways to set boundaries in order to achieve balance in  their lives: 4.  Patient will demonstrate ability to communicate their needs and set boundaries  through discussion and/or role plays  Summary of Patient Progress:  Patient was present for the entirety of the group session. Patient was an active listener and participated in the topic of discussion, provided helpful advice to others, and added nuance to topic of conversation. Patient shared that he needs to enforce more firm time boundaries so that he can focus more on himself and his recovery.   Therapeutic  Modalities:   Cognitive Behavioral Therapy Solution-Focused Therapy  Almedia Balls 05/16/2022  2:23 PM

## 2022-05-16 NOTE — Progress Notes (Signed)
   05/15/22 2300  Psych Admission Type (Psych Patients Only)  Admission Status Involuntary  Psychosocial Assessment  Patient Complaints Anxiety  Eye Contact Fair  Facial Expression Worried  Affect Appropriate to circumstance  Speech Logical/coherent  Interaction Assertive  Motor Activity Fidgety  Appearance/Hygiene Improved  Behavior Characteristics Appropriate to situation  Mood Anxious  Thought Process  Coherency WDL  Content WDL  Delusions None reported or observed  Perception WDL  Hallucination None reported or observed  Judgment Poor  Confusion None  Danger to Self  Current suicidal ideation? Denies  Agreement Not to Harm Self Yes  Description of Agreement verbal  Danger to Others  Danger to Others None reported or observed

## 2022-05-16 NOTE — [facility] Group Notes (Signed)
PT attended NA group and was appropriate and attentive. 

## 2022-05-16 NOTE — Progress Notes (Signed)
Pt denies SI/HI/AVH and verbally agrees to approach staff if these become apparent or before harming themselves/others. Rates depression 4/10. Rates anxiety 5/10. Rates pain 0/10.  Pt has been interacting well with others. Scheduled medications administered to pt, per MD orders. RN provided support and encouragement to pt. Q15 min safety checks implemented and continued. Pt safe on the unit. RN will continue to monitor and intervene as needed.   05/16/22 0751  Psych Admission Type (Psych Patients Only)  Admission Status Involuntary  Psychosocial Assessment  Patient Complaints None  Eye Contact Fair  Facial Expression Animated  Affect Appropriate to circumstance  Speech Logical/coherent  Interaction Assertive  Motor Activity Restless  Appearance/Hygiene Unremarkable  Behavior Characteristics Cooperative;Appropriate to situation;Calm  Mood Pleasant  Thought Process  Coherency WDL  Delusions None reported or observed  Perception WDL  Hallucination None reported or observed  Judgment Impaired  Confusion None  Danger to Self  Current suicidal ideation? Denies  Danger to Others  Danger to Others None reported or observed

## 2022-05-16 NOTE — Progress Notes (Addendum)
[facility] MD Progress Note  05/16/2022 11:37 AM John Vaughan  MRN:  [id number]  Subjective:  John Vaughan states: "Today is my best day since admission because I woke up with a great mood. I continue on my detox medicine."  Reason For Admission: As per H & P, "This is one of several psychiatric admissions/evaluations in this [facility] for this 31 year old Caucasian male with hx of alcoholism, bipolar disorder  & seizure disorder. He is known in this [facility] from his previous admissions in this hospital all with similar complaints. Patient is known to be non-adherent to his treatment regimen. He is being admitted to the [facility] this time around with complain of mood instability after an altercation with mother. On arrival to the ED, his BAL was 377 & UDS was positive for cocaine."    Yesterday, the psychiatric Team made the following recommendations: -Continue Ativan detox protocol for alcohol use d/o  - Continue Tegretol chewable tabs 100 mg po tid for seizure disorder.  - Continue Duloxetine 40 mg po daily for depression.  - Continue gabapentin 300 mg po tid for anxiety/ETOH use d/o - Hydroxyzine 50 mg po tid for anxiety.  - Continue Trazodone 100 mg po Q hs prn for insomnia.   Today's assessment note: Chart reviewed, his case discussed with his treatment team.   On exam today, the pt reports that his mood is less depressed and anxiety is less.  He reports that his sleep quality is good and his appetite is great consuming 2 plates of meals, and denies any medication related side effects. Fine tremors visible to bilateral forearms/fingers. He denies having other alcohol or substance use w/d symptoms.  He denies SI/HI. He denies AH, VH or paranoia. There is no evidence of delusional thoughts.    Reported that after playing basket ball outside yesterday, his hands and legs trembled but later stopped after 2 hours. He reports this has occurred in the past in similar exertional activities. He reported this to the  nurse. Instruct to inform this Provider if this reoccurs. Pt reports that he plans to go to his friend whom he meets here at [facility] or to his brother upon discharge from [facility]. He reports that his mother's home is not a safe place for him due to her substance abuse. He reports that his mother uses crack/cocaine with her sister. Patient admitted using poly substance with mother in the past. Instructions provided on cessation of polysubstance use with verbalization of understanding. Will continue plan of care as indicated below.  Principal Problem: Bipolar I disorder, most recent episode depressed (HCC) Diagnosis: Principal Problem:   Bipolar I disorder, most recent episode depressed (HCC) Active Problems:   GAD (generalized anxiety disorder)   Alcohol use disorder, severe, dependence (HCC)   Alcohol withdrawal (HCC)   Cocaine use disorder (HCC)   Bipolar affective disorder (HCC)   Past Psychiatric History:  Patient has been hospitalized at our facility for similar circumstances x multiple times in the past.  He has a longstanding history of alcohol dependence.  This started at age 50.  His last admission was secondary to suicidal ideation as well as substance abuse.   Past Medical History:  Past Medical History:  Diagnosis Date   Alcohol abuse    Anxiety    Bipolar 2 disorder (HCC)    Depression     Past Surgical History:  Procedure Laterality Date   HAND SURGERY Right    Family History:  Family History  Problem Relation  Age of Onset   Anxiety disorder Mother    Alcohol abuse Father    Anxiety disorder Maternal Aunt    Anxiety disorder Maternal Grandmother    Alcohol abuse Paternal Grandfather    Family Psychiatric  History: as above  Social History:  Social History   Substance and Sexual Activity  Alcohol Use Yes   Alcohol/week: 2.0 standard drinks   Types: 2 Cans of beer per week   Comment: 48 beers a week     Social History   Substance and Sexual Activity  Drug Use  Not Currently   Types: Marijuana, Cocaine   Comment: cocaine in last month    Social History   Socioeconomic History   Marital status: Single    Spouse name: Not on file   Number of children: Not on file   Years of education: Not on file   Highest education level: Not on file  Occupational History   Not on file  Tobacco Use   Smoking status: Every Day    Packs/day: 1.50    Types: Cigarettes   Smokeless tobacco: Former    Types: Snuff  Vaping Use   Vaping Use: Never used  Substance and Sexual Activity   Alcohol use: Yes    Alcohol/week: 2.0 standard drinks    Types: 2 Cans of beer per week    Comment: 48 beers a week   Drug use: Not Currently    Types: Marijuana, Cocaine    Comment: cocaine in last month   Sexual activity: Not Currently  Other Topics Concern   Not on file  Social History Narrative   Not on file   Social Determinants of Health   Financial Resource Strain: Not on file  Food Insecurity: Not on file  Transportation Needs: Not on file  Physical Activity: Not on file  Stress: Not on file  Social Connections: Not on file   Additional Social History:     Sleep: Fair  Appetite:  Good  Current Medications: Current Facility-Administered Medications  Medication Dose Route Frequency Provider Last Rate Last Admin   carbamazepine (TEGRETOL) chewable tablet 100 mg  100 mg Oral TID Armandina Stammer I, NP   100 mg at 05/16/22 1121   DULoxetine (CYMBALTA) DR capsule 40 mg  40 mg Oral Daily Armandina Stammer I, NP   40 mg at 05/16/22 0751   gabapentin (NEURONTIN) capsule 300 mg  300 mg Oral TID Armandina Stammer I, NP   300 mg at 05/16/22 1121   hydrOXYzine (ATARAX) tablet 50 mg  50 mg Oral TID PRN Armandina Stammer I, NP   50 mg at 05/15/22 2108   loperamide (IMODIUM) capsule 2-4 mg  2-4 mg Oral PRN Starleen Blue, NP       LORazepam (ATIVAN) tablet 1 mg  1 mg Oral Q6H PRN Nkwenti, Doris, NP       LORazepam (ATIVAN) tablet 1 mg  1 mg Oral BID Hill, Shelbie Hutching, MD   1 mg at  05/16/22 0750   Followed by   Melene Muller ON 05/17/2022] LORazepam (ATIVAN) tablet 1 mg  1 mg Oral Daily Hill, Shelbie Hutching, MD       multivitamin with minerals tablet 1 tablet  1 tablet Oral Daily Armandina Stammer I, NP   1 tablet at 05/16/22 0751   nicotine (NICODERM CQ - dosed in mg/24 hours) patch 14 mg  14 mg Transdermal Daily Ajibola, Ene A, NP   14 mg at 05/16/22 0752   ondansetron (ZOFRAN-ODT) disintegrating  tablet 4 mg  4 mg Oral Q6H PRN Starleen BlueNkwenti, Doris, NP       pantoprazole (PROTONIX) EC tablet 40 mg  40 mg Oral Daily Nwoko, Agnes I, NP   40 mg at 05/16/22 0751   thiamine tablet 100 mg  100 mg Oral Daily Nkwenti, Tyler Aasoris, NP   100 mg at 05/16/22 0751   traZODone (DESYREL) tablet 100 mg  100 mg Oral QHS PRN Armandina StammerNwoko, Agnes I, NP   100 mg at 05/15/22 2108    Lab Results:  No results found for this or any previous visit (from the past 48 hour(s)).  Blood Alcohol level:  Lab Results  Component Value Date   ETH 377 (HH) 05/10/2022   ETH 364 (HH) 11/25/2021   Metabolic Disorder Labs: Lab Results  Component Value Date   HGBA1C 5.4 05/14/2022   MPG 108.28 05/14/2022   MPG 102.54 10/25/2021   No results found for: PROLACTIN Lab Results  Component Value Date   CHOL 196 05/14/2022   TRIG 93 05/14/2022   HDL 57 05/14/2022   CHOLHDL 3.4 05/14/2022   VLDL 19 05/14/2022   LDLCALC 120 (H) 05/14/2022   LDLCALC 93 10/25/2021   Physical Findings: AIMS: 0 CIWA:  n/a COWS:  n/a   Musculoskeletal: Strength & Muscle Tone: within normal limits Gait & Station: normal Patient leans: N/A  Psychiatric Specialty Exam:  Presentation  General Appearance: Appropriate for Environment; Casual; Fairly Groomed  Eye Contact:Good  Speech:Clear and Coherent; Normal Rate  Speech Volume:Normal  Handedness:Right  Mood and Affect  Mood:less depressed   Affect:Appropriate  Thought Process  Thought Processes:Coherent; Linear  Descriptions of Associations:Intact  Orientation:Full (Time, Place  and Person)  Thought Content:Logical; WDL  History of Schizophrenia/Schizoaffective disorder:No  Duration of Psychotic Symptoms:No data recorded Hallucinations:Hallucinations: None  Ideas of Reference:None  Suicidal Thoughts:Suicidal Thoughts: No  Homicidal Thoughts:Homicidal Thoughts: No  Sensorium  Memory:Immediate Good; Recent Good; Remote Good  Judgment:Fair  Insight:Fair  Executive Functions  Concentration:Good  Attention Span:Good  Recall:Fair  Fund of Knowledge:Fair  Language:Good  Psychomotor Activity  Psychomotor Activity:Psychomotor Activity: Normal  Assets  Assets:Communication Skills; Physical Health  Sleep  Sleep:Sleep: Good Number of Hours of Sleep: 8  Physical Exam: Physical Exam Vitals and nursing note reviewed.  Constitutional:      Appearance: Normal appearance.  HENT:     Head: Normocephalic.     Right Ear: External ear normal.     Left Ear: External ear normal.     Nose: Nose normal. No congestion or rhinorrhea.     Mouth/Throat:     Mouth: Mucous membranes are moist.     Pharynx: Oropharynx is clear.  Eyes:     Extraocular Movements: Extraocular movements intact.     Conjunctiva/sclera: Conjunctivae normal.     Pupils: Pupils are equal, round, and reactive to light.  Cardiovascular:     Rate and Rhythm: Normal rate.     Pulses: Normal pulses.  Pulmonary:     Effort: Pulmonary effort is normal.  Abdominal:     Palpations: Abdomen is soft.  Genitourinary:    Comments: deferred Musculoskeletal:        General: Normal range of motion.     Cervical back: Normal range of motion and neck supple.  Skin:    General: Skin is warm.  Neurological:     General: No focal deficit present.     Mental Status: He is alert and oriented to person, place, and time.     Sensory: No  sensory deficit.     Coordination: Coordination normal.  Psychiatric:        Behavior: Behavior normal.   Review of Systems  Constitutional: Negative.   Negative for chills and fever.  HENT: Negative.  Negative for hearing loss and tinnitus.   Eyes: Negative.  Negative for blurred vision and double vision.  Respiratory: Negative.  Negative for cough, sputum production, shortness of breath and wheezing.   Cardiovascular: Negative.  Negative for chest pain and palpitations.  Gastrointestinal: Negative.  Negative for abdominal pain, constipation, diarrhea, heartburn, nausea and vomiting.  Genitourinary: Negative.  Negative for dysuria, frequency and urgency.  Musculoskeletal: Negative.  Negative for back pain, falls, joint pain, myalgias and neck pain.  Skin: Negative.  Negative for itching and rash.  Neurological:  Positive for seizures (Has history of seizures). Negative for dizziness, tingling, tremors, sensory change, speech change, focal weakness, loss of consciousness and headaches.  Endo/Heme/Allergies: Negative.  Negative for environmental allergies and polydipsia. Does not bruise/bleed easily.  Psychiatric/Behavioral:  Positive for depression and substance abuse. Negative for hallucinations, memory loss and suicidal ideas. The patient is nervous/anxious. The patient does not have insomnia.    Blood pressure 106/82, pulse 72, temperature 97.6 F (36.4 C), temperature source Oral, resp. rate 16, height 6' 0.1" (1.831 m), weight 83 kg, SpO2 100 %. Body mass index is 24.75 kg/m.   Treatment Plan Summary: Daily contact with patient to assess and evaluate symptoms and progress in treatment and Medication management  Observation Level/Precautions:  15 minute checks  Laboratory:  Labs reviewed   Psychotherapy:  Unit Group sessions  Medications:  See [facility]  Consultations:  To be determined   Discharge Concerns:  Safety, medication compliance, mood stability  Estimated LOS: 5-7 days  Other:  N/A    PLAN Safety and Monitoring: Voluntary admission to inpatient psychiatric unit for safety, stabilization and treatment Daily contact with  patient to assess and evaluate symptoms and progress in treatment Patient's case to be discussed in multi-disciplinary team meeting Observation Level : q15 minute checks Vital signs: q12 hours Precautions: suicide, elopement, and assault                 Diagnoses Principal Problem:   Bipolar I disorder, most recent episode depressed (HCC) Active Problems:   GAD (generalized anxiety disorder)   Alcohol use disorder, severe, dependence (HCC)   Alcohol withdrawal (HCC)   Cocaine use disorder (HCC)   Bipolar affective disorder (HCC)                  Medications -Continue Ativan detox protocol for alcohol use d/o - Increase Tegretol chewable tabs from 100 mg po tid to 200 mg tid - for seizure disorder and mood stabilization.  - Continue Duloxetine 40 mg po daily for depression.  - Continue gabapentin 300 mg po tid for anxiety/ETOH use d/o - Continue Hydroxyzine 50 mg po tid for anxiety.  - Continue Protonix 40 mg po Q am for GERD.  - Continue Trazodone 100 mg po Q hs prn for insomnia.  - Continue nicotine patch 14 mg trans-dermally Q 24 hrs for nicotine withdrawal management.  - Continue multivitamin 1 tablet po daily for vitamin supplementation.  Other PRNS -Continue Tylenol 650 mg every 6 hours PRN for mild pain -Continue Maalox 30 mg every 4 hrs PRN for indigestion -Continue Imodium 2-4 mg as needed for diarrhea -Continue Milk of Magnesia as needed every 6 hrs for constipation -Continue Zofran disintegrating tabs every 6 hrs PRN  for nausea   Discharge Planning: Social work and case management to assist with discharge planning and identification of hospital follow-up needs prior to discharge Estimated LOS: 5-7 days Discharge Concerns: Need to establish a safety plan; Medication compliance and effectiveness Discharge Goals: Return home with outpatient referrals for mental health follow-up including medication management/psychotherapy  I certify that inpatient services furnished  can reasonably be expected to improve the patient's condition.    Cecilie Lowers, FNP 5/24/202311:37 AM   Patient ID: John Vaughan, male   DOB: [birth year], 31 y.o.   MRN: [id number]  Total Time Spent in Direct Patient Care:  I personally spent 30 minutes on the unit in direct patient care. The direct patient care time included face-to-face time with the patient, reviewing the patient's chart, communicating with other professionals, and coordinating care. Greater than 50% of this time was spent in counseling or coordinating care with the patient regarding goals of hospitalization, psycho-education, and discharge planning needs.  I have independently evaluated the patient during a face-to-face assessment on 05/16/22. I reviewed the patient's chart, and I participated in key portions of the service. I discussed the case with the APP, and I agree with the assessment and plan of care as documented in the APP's note , as addended by me or notated below:  I directly edited the note, as above.   Phineas Inches, MD Psychiatrist

## 2022-05-16 NOTE — [facility] Group Notes (Signed)
Adult Psychoeducational Group Note  Date:  05/16/2022 Time:  11:09 AM  Group Topic/Focus:  Healthy Communication:   The focus of this group is to discuss communication, barriers to communication, as well as healthy ways to communicate with others.  Participation Level:  Active  Participation Quality:  Intrusive  Affect:  Appropriate  Cognitive:  Appropriate  Insight: Appropriate  Engagement in Group:  Improving  Modes of Intervention:  Discussion  Additional Comments:    Donell Beers 05/16/2022, 11:09 AM

## 2022-05-16 NOTE — Group Note (Signed)
Recreation Therapy Group Note   Group Topic:Team Building  Group Date: 05/16/2022 Start Time: 0930 End Time: 1000 Facilitators: Caroll Rancher, LRT,CTRS Location: [address] Dayroom   Goal Area(s) Addresses:  Patient will effectively work with peer towards shared goal.  Patient will identify skills used to make activity successful.  Patient will identify how skills used during activity can be applied to reach post d/c goals.    Group Description: [employer]. In teams of 5-6, patients were given 25 small craft pipe cleaners. Using the materials provided, patients were instructed to compete again the opposing team(s) to build the tallest free-standing structure from floor level. The activity was timed; difficulty increased by Clinical research associate as Production designer, theatre/television/film continued.  Systematically resources were removed with additional directions for example, placing one arm behind their back, working in silence, and shape stipulations. LRT facilitated post-activity discussion reviewing team processes and necessary communication skills involved in completion. Patients were encouraged to reflect how the skills utilized, or not utilized, in this activity can be incorporated to positively impact support systems post discharge.   Affect/Mood: Appropriate   Participation Level: Engaged   Participation Quality: Independent   Behavior: Appropriate   Speech/Thought Process: Focused   Insight: Good   Judgement: Good   Modes of Intervention: Team-building   Patient Response to Interventions:  Engaged   Education Outcome:  Acknowledges education and In group clarification offered    Clinical Observations/Individualized Feedback: Pt appeared to be one of the leaders in the group as well.  Pt was appropriate and engaged throughout the group.  Pt made suggestions as well as listened to them as well.    Plan: Continue to engage patient in RT group sessions 2-3x/week.   Caroll Rancher,  LRT,CTRS 05/16/2022 11:24 AM

## 2022-05-16 NOTE — [facility] Group Notes (Signed)
The focus of this group is to help patients establish daily goals to achieve during treatment and discuss how the patient can incorporate goal setting into their daily lives to aide in recovery.   Pt stated that his goal is to take his medicine

## 2022-05-17 MED ORDER — DULOXETINE HCL 60 MG PO CPEP
60.0000 mg | ORAL_CAPSULE | Freq: Every day | ORAL | Status: DC
  Administered 2022-05-18: 60 mg via ORAL
  Filled 2022-05-17 (×2): qty 1
  Filled 2022-05-17: qty 7

## 2022-05-17 MED ORDER — CARBAMAZEPINE 200 MG PO TABS
200.0000 mg | ORAL_TABLET | Freq: Three times a day (TID) | ORAL | Status: DC
  Administered 2022-05-17 – 2022-05-18 (×4): 200 mg via ORAL
  Filled 2022-05-17: qty 1
  Filled 2022-05-17 (×3): qty 21
  Filled 2022-05-17 (×5): qty 1

## 2022-05-17 NOTE — Progress Notes (Signed)
Pt denies SI/HI/AVH and verbally agrees to approach staff if these become apparent or before harming themselves/others. Rates depression 2/10. Rates anxiety 8/10. Rates pain 0/10. Pt became anxious this evening. Pt stated it was due to all of his friends leaving. Pt said he was sad that they were gone. Pt reassured. Pt has had no other issues for the remanding of the day. Scheduled medications administered to pt, per MD orders. RN provided support and encouragement to pt. Q15 min safety checks implemented and continued. Pt safe on the unit. RN will continue to monitor and intervene as needed.   05/17/22 0750  Psych Admission Type (Psych Patients Only)  Admission Status Involuntary  Psychosocial Assessment  Patient Complaints Anxiety;Depression  Eye Contact Fair  Facial Expression Animated;Anxious  Affect Anxious  Speech Logical/coherent  Interaction Assertive  Motor Activity Fidgety  Appearance/Hygiene Unremarkable  Behavior Characteristics Cooperative;Appropriate to situation;Anxious  Mood Depressed;Anxious;Pleasant  Thought Process  Coherency WDL  Content WDL  Delusions None reported or observed  Perception WDL  Hallucination None reported or observed  Judgment WDL  Confusion None  Danger to Self  Current suicidal ideation? Denies  Danger to Others  Danger to Others None reported or observed
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
## 2022-05-17 NOTE — Progress Notes (Signed)
[facility] Group Notes:  (Nursing/MHT/Case Management/Adjunct)  Date:  05/17/2022  Time:  2015 Type of Therapy:   wrap up group  Participation Level:  Active  Participation Quality:  Attentive and Sharing  Affect:  Resistant  Cognitive:  Alert  Insight:  Lacking  Engagement in Group:  Distracting and Limited  Modes of Intervention:  Clarification, Education, and Support  Summary of Progress/Problems: Positive thinking and positive change were discussed.   Marcille Buffy 05/17/2022, 9:24 PM

## 2022-05-17 NOTE — Progress Notes (Signed)
   05/17/22 0000  Psych Admission Type (Psych Patients Only)  Admission Status Involuntary  Psychosocial Assessment  Patient Complaints None  Eye Contact Fair  Facial Expression Animated  Affect Appropriate to circumstance  Speech Logical/coherent  Interaction Assertive  Motor Activity Fidgety  Appearance/Hygiene Unremarkable  Behavior Characteristics Appropriate to situation;Cooperative  Mood Pleasant  Thought Process  Coherency WDL  Content WDL  Delusions None reported or observed  Perception WDL  Hallucination None reported or observed  Judgment Impaired  Confusion None  Danger to Self  Current suicidal ideation? Denies  Agreement Not to Harm Self Yes  Description of Agreement verbal  Danger to Others  Danger to Others None reported or observed

## 2022-05-17 NOTE — Group Note (Signed)
Date:  05/17/2022 Time:  10:14 AM  Group Topic/Focus:  Orientation:   The focus of this group is to educate the patient on the purpose and policies of crisis stabilization and provide a format to answer questions about their admission.  The group details unit policies and expectations of patients while admitted.    Participation Level:  Active  Participation Quality:  Appropriate  Affect:  Appropriate  Cognitive:  Appropriate  Insight: Appropriate  Engagement in Group:  Engaged  Modes of Intervention:  Discussion  Additional Comments:    Jaquita Rector 05/17/2022, 10:14 AM
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
## 2022-05-18 ENCOUNTER — Encounter (HOSPITAL_COMMUNITY): Payer: Self-pay

## 2022-05-18 DIAGNOSIS — F314 Bipolar disorder, current episode depressed, severe, without psychotic features: Secondary | ICD-10-CM

## 2022-05-18 LAB — BASIC METABOLIC PANEL
Anion gap: 5 (ref 5–15)
BUN: 13 mg/dL (ref 6–20)
CO2: 29 mmol/L (ref 22–32)
Calcium: 9.5 mg/dL (ref 8.9–10.3)
Chloride: 106 mmol/L (ref 98–111)
Creatinine, Ser: 0.91 mg/dL (ref 0.61–1.24)
GFR, Estimated: >60 mL/min (ref >60)
Glucose, Bld: 106 mg/dL — ABNORMAL HIGH (ref 70–99)
Potassium: 4 mmol/L (ref 3.5–5.1)
Sodium: 140 mmol/L (ref 135–145)

## 2022-05-18 MED ORDER — GABAPENTIN 300 MG PO CAPS: 300.0000 mg | ORAL_CAPSULE | Freq: Three times a day (TID) | ORAL | 0 refills | Status: DC

## 2022-05-18 MED ORDER — DULOXETINE HCL 60 MG PO CPEP: 60.0000 mg | ORAL_CAPSULE | Freq: Every day | ORAL | 0 refills | Status: DC

## 2022-05-18 MED ORDER — HYDROXYZINE HCL 50 MG PO TABS: 50.0000 mg | ORAL_TABLET | Freq: Three times a day (TID) | ORAL | 0 refills | Status: DC | PRN

## 2022-05-18 MED ORDER — TRAZODONE HCL 100 MG PO TABS: 100.0000 mg | ORAL_TABLET | Freq: Every evening | ORAL | 0 refills | Status: DC | PRN

## 2022-05-18 MED ORDER — CARBAMAZEPINE 200 MG PO TABS: 200.0000 mg | ORAL_TABLET | Freq: Three times a day (TID) | ORAL | 0 refills | Status: DC

## 2022-05-18 MED ORDER — NICOTINE 14 MG/24HR TD PT24: 14.0000 mg | MEDICATED_PATCH | Freq: Every day | TRANSDERMAL | 0 refills | Status: DC

## 2022-05-18 MED ORDER — PANTOPRAZOLE SODIUM 40 MG PO TBEC: 40.0000 mg | DELAYED_RELEASE_TABLET | Freq: Every day | ORAL | 0 refills | Status: DC

## 2022-05-18 NOTE — Group Note (Signed)
Recreation Therapy Group Note   Group Topic:Stress Management  Group Date: 05/18/2022 Start Time: 0930 End Time: 0945 Facilitators: Caroll Rancher, [place] Location: [address] Dayroom   Goal Area(s) Addresses:  Patient will identify positive stress management techniques. Patient will identify benefits of using stress management post d/c.  Group Description:  Meditation.  LRT explained the stress management technique of meditation to patients.  LRT then played a meditation that focused on bringing positive energy into your day through positive self talk.  Patients were to listen as meditation played and quietly repeat the affirmations to themselves to get the full affect of the exercise.  At conclusion, LRT made suggestions of where patients could find more meditations and other stress management techniques.   Affect/Mood: Appropriate   Participation Level: Engaged   Participation Quality: Independent   Behavior: Appropriate   Speech/Thought Process: Focused   Insight: Good   Judgement: Good   Modes of Intervention: Soft Music   Patient Response to Interventions:  Engaged   Education Outcome:  Acknowledges education and In group clarification offered    Clinical Observations/Individualized Feedback: Pt attended and participated in group session.     Plan: Continue to engage patient in RT group sessions 2-3x/week.   Caroll Rancher, Antonietta Jewel 05/18/2022 12:26 PM

## 2022-05-18 NOTE — [facility] Suicide Risk Assessment (Cosign Needed)
Suicide Risk Assessment  Discharge Assessment    [facility] Discharge Suicide Risk Assessment   Principal Problem: Bipolar I disorder, most recent episode depressed (HCC) Discharge Diagnoses: Principal Problem:   Bipolar I disorder, most recent episode depressed (HCC) Active Problems:   GAD (generalized anxiety disorder)   Alcohol use disorder, severe, dependence (HCC)   Alcohol withdrawal (HCC)   Cocaine use disorder (HCC)   Bipolar affective disorder (HCC)  Reason For Admission: As per H & P, "This is one of several psychiatric admissions/evaluations in this [facility] for this 31 year old Caucasian male with hx of alcoholism, bipolar disorder  & seizure disorder. He is known in this [facility] from his previous admissions in this hospital all with similar complaints. Patient is known to be non-adherent to his treatment regimen. He is being admitted to the [facility] this time around with complain of mood instability after an altercation with mother. On arrival to the ED, his BAL was 377 & UDS was positive for cocaine."                                   HOSPITAL COURSE During the patient's hospitalization, patient had extensive initial psychiatric evaluation, and follow-up psychiatric evaluations every day. Diagnoses provided & medications started upon initial assessment was as follows:  -Continue Tegretol chewable tabs 100 mg po tid for seizure disorder.  - Continue Duloxetine 40 mg po daily for depression.  - Continue gabapentin 300 mg po tid for alcohol withdrawal syndrome.  - Hydroxyzine 50 mg po tid for anxiety.  - Continue Protonix 40 mg po Q am for GERD.  - Continue Trazodone 100 mg po Q hs prn for insomnia.  - Continue nicotine patch 14 mg trans-dermally Q 24 hrs for nicotine withdrawal management.  - Continue multivitamin 1 tablet po daily for vitamin supplementation.  During the hospitalization, other adjustments were made to the patient's medication regimen, with the final medications at discharge  being as follows: -Continue Tegretol chewable tabs 200 mg po tid for seizure disorder/mood stabilization - Continue Duloxetine 60 mg po daily for depression.  - Continue gabapentin 300 mg po tid for anxiety/ETOH use d/o - Continue Hydroxyzine 50 mg po tid for anxiety.  - Continue Protonix 40 mg po Q am for GERD.  - Continue Trazodone 100 mg po Q hs prn for insomnia.   Patient's care was discussed during the interdisciplinary team meeting every day during the hospitalization. The patient denies having side effects to prescribed psychiatric medication. The patient was evaluated each day by a clinical provider to ascertain response to treatment. Improvement was noted by the patient's report of decreasing symptoms, improved sleep and appetite, affect, medication tolerance, behavior, and participation in unit programming.  Patient was asked each day to complete a self inventory noting mood, mental status, pain, new symptoms, anxiety and concerns.    Symptoms were reported as significantly decreased or resolved completely by discharge. On day of discharge, the patient reports that their mood is stable. The patient denied having suicidal thoughts for more than 48 hours prior to discharge.  Patient denies having homicidal thoughts.  Patient denies having auditory hallucinations.  Patient denies any visual hallucinations or other symptoms of psychosis. The patient was motivated to continue taking medication with a goal of continued improvement in mental health.   The patient reports their target psychiatric symptoms of depression & anxiety responded well to the psychiatric medications, and the  patient reports overall benefit from this psychiatric hospitalization. Supportive psychotherapy was provided to the patient. The patient also participated in regular group therapy while hospitalized. Coping skills, problem solving as well as relaxation therapies were also part of the unit programming.  Labs were reviewed  with the patient, and abnormal results were discussed with the patient.  Total Time spent with patient: 30 minutes  Musculoskeletal: Strength & Muscle Tone: within normal limits Gait & Station: normal Patient leans: N/A  Psychiatric Specialty Exam  Presentation  General Appearance: Appropriate for Environment; Fairly Groomed  Eye Contact:Good  Speech:Clear and Coherent  Speech Volume:Normal  Handedness:Right  Mood and Affect  Mood:Euthymic  Duration of Depression Symptoms: Greater than two weeks  Affect:Appropriate  Thought Process  Thought Processes:Coherent  Descriptions of Associations:Intact  Orientation:Full (Time, Place and Person)  Thought Content:Logical  History of Schizophrenia/Schizoaffective disorder:No  Duration of Psychotic Symptoms:No data recorded Hallucinations:Hallucinations: None  Ideas of Reference:None  Suicidal Thoughts:Suicidal Thoughts: No  Homicidal Thoughts:Homicidal Thoughts: No  Sensorium  Memory:Immediate Good; Recent Good  Judgment:Good  Insight:Good  Executive Functions  Concentration:Good  Attention Span:Good  Recall:Good  Fund of Knowledge:Good  Language:Good  Psychomotor Activity  Psychomotor Activity:Psychomotor Activity: Normal  Assets  Assets:Communication Skills  Sleep  Sleep:Sleep: Good  Physical Exam: Physical Exam Constitutional:      Appearance: Normal appearance.  HENT:     Head: Normocephalic.     Nose: Nose normal. No congestion or rhinorrhea.  Eyes:     Pupils: Pupils are equal, round, and reactive to light.  Pulmonary:     Effort: Pulmonary effort is normal. No respiratory distress.  Musculoskeletal:     Cervical back: Normal range of motion.  Neurological:     Mental Status: He is alert and oriented to person, place, and time.     Coordination: Coordination normal.  Psychiatric:        Thought Content: Thought content normal.   Review of Systems  Constitutional: Negative.   Negative for fever.  HENT: Negative.  Negative for sore throat.   Eyes: Negative.   Respiratory: Negative.  Negative for cough.   Cardiovascular: Negative.  Negative for chest pain.  Gastrointestinal: Negative.   Genitourinary: Negative.   Musculoskeletal: Negative.   Skin: Negative.   Psychiatric/Behavioral:  Positive for depression (Pt reports that his depressive symptoms are resolving. He denies SI/HI/AVH currently and verbally contracts for safety outside of [facility]) and substance abuse. Negative for hallucinations, memory loss and suicidal ideas. Nervous/anxious: anxiety is resolving with current medications. Insomnia: insomnia is resolving on current medications.   Blood pressure 106/69, pulse 79, temperature (!) 97.4 F (36.3 C), temperature source Oral, resp. rate 16, height 6' 0.1" (1.831 m), weight 83 kg, SpO2 100 %. Body mass index is 24.75 kg/m.  Mental Status Per Nursing Assessment::   On Admission:  NA  Demographic Factors:  Male  Loss Factors: Financial problems/change in socioeconomic status  Historical Factors: Family history of mental illness or substance abuse  Risk Reduction Factors:   Living with another person, especially a relative  Continued Clinical Symptoms:  Alcohol/Substance Abuse/Dependencies  Cognitive Features That Contribute To Risk:  None    Suicide Risk:  Minimal: No identifiable suicidal ideation.  Patients presenting with no risk factors but with morbid ruminations; may be classified as minimal risk based on the severity of the depressive symptoms   Follow-up Information     [facility].   Specialty: Behavioral Health Why: You  have an appointment to obtain therapy and medication management services on 05/23/22  at 7:30 am.  Services are provided on a first come, first served basis. Contact information: [address] [place] [address] [phone]               Plan  Of Care/Follow-up recommendations:   The patient is able to verbalize their individual safety plan to this provider.  # It is recommended to the patient to continue psychiatric medications as prescribed, after discharge from the hospital.    # It is recommended to the patient to follow up with your outpatient psychiatric provider and PCP.  # It was discussed with the patient, the impact of alcohol, drugs, tobacco have been there overall psychiatric and medical wellbeing, and total abstinence from substance use was recommended the patient.ed.  # Prescriptions provided or sent directly to preferred pharmacy at discharge. Patient agreeable to plan. Given opportunity to ask questions. Appears to feel comfortable with discharge.    # In the event of worsening symptoms, the patient is instructed to call the crisis hotline (988), 911 and or go to the nearest ED for appropriate evaluation and treatment of symptoms. To follow-up with primary care provider for other medical issues, concerns and or health care needs  # Patient was discharged to his mother's home with a plan to follow up as noted above.   Starleen Blue, NP 05/18/2022, 11:15 AM

## 2022-05-18 NOTE — Group Note (Signed)
LCSW Group Therapy Note  Group Date: 05/18/2022 Start Time: 1300 End Time: 1400   Type of Therapy and Topic:  Group Therapy - Healthy vs Unhealthy Coping Skills  Participation Level:  Active   Description of Group The focus of this group was to determine what unhealthy coping techniques typically are used by group members and what healthy coping techniques would be helpful in coping with various problems. Patients were guided in becoming aware of the differences between healthy and unhealthy coping techniques. Patients were asked to identify 2-3 healthy coping skills they would like to learn to use more effectively.  Therapeutic Goals Patients learned that coping is what human beings do all day long to deal with various situations in their lives Patients defined and discussed healthy vs unhealthy coping techniques Patients identified their preferred coping techniques and identified whether these were healthy or unhealthy Patients determined 2-3 healthy coping skills they would like to become more familiar with and use more often. Patients provided support and ideas to each other  Summary of Patient Progress:  Patient was present for the entirety of the group session. Patient was an active listener and participated in the topic of discussion, provided helpful advice to others, and added nuance to topic of conversation. States he plans on coping by "walking away" and to "play an instrument <guitar>."   Therapeutic Modalities Cognitive Behavioral Therapy Motivational Interviewing  Durenda Hurt, Latanya Presser 05/18/2022  2:10 PM

## 2022-05-18 NOTE — Progress Notes (Addendum)
  [facility] Adult Case Management Discharge Plan :  Will you be returning to the same living situation after discharge:  No. Patient to live with friend after discharge.   At discharge, do you have transportation home?: Yes,  Patient discharged to the lobby with bus pass to return home  Do you have the ability to pay for your medications: No. Patient has no listed insurance, to be provided with 7 day supply of medication at discharge. CSW has provided patient with clinic information for free/reduced medications.   Release of information consent forms completed and in the chart;  Patient's signature needed at discharge.  Patient to Follow up at:  Follow-up Information     [facility]. Go on 05/23/2022.   Specialty: Behavioral Health Why: You have an appointment to obtain therapy and medication management services on 05/23/22  at 7:30 am.  Services are provided on a first come, first served basis. Contact information: [address] [employer] [place] [address] [phone]                Next level of care provider has access to [facility] Link:yes  Safety Planning and Suicide Prevention discussed: Yes,  SPE completed with patient and Braylyn Kalter, mother.    Has patient been referred to the Quitline?: Patient refused referral Tobacco Use: High Risk   Smoking Tobacco Use: Every Day   Smokeless Tobacco Use: Former   Passive Exposure: Not on file    Patient has been referred for addiction treatment: Yes Patient referred to [facility]) outpatient mental health clinic on [address] for outpatient mental health and substance use treatment. Appointment information listed above.    Corky Crafts, LCSWA 05/18/2022, 10:27 AM

## 2022-05-18 NOTE — Discharge Summary (Signed)
Physician Discharge Summary Note  Patient:  John Vaughan is an 31 y.o., male MRN:  [id number] DOB:  Feb 21, 1991 Patient phone:  [phone] (home)  Patient address:   [place] [address],  Total Time spent with patient: 30 minutes  Date of Admission:  05/13/2022 Date of Discharge: 05/18/2022  Reason for Admission:   As per H & P, "This is one of several psychiatric admissions/evaluations in this [facility] for this 31 year old Caucasian male with hx of alcoholism, bipolar disorder  & seizure disorder. He is known in this [facility] from his previous admissions in this hospital all with similar complaints. Patient is known to be non-adherent to his treatment regimen. He is being admitted to the [facility] this time around with complain of mood instability after an altercation with mother. On arrival to the ED, his BAL was 377 & UDS was positive for cocaine."  Principal Problem: Bipolar I disorder, most recent episode depressed ([place]) Discharge Diagnoses: Principal Problem:   Bipolar I disorder, most recent episode depressed ([place]) Active Problems:   GAD (generalized anxiety disorder)   Alcohol use disorder, severe, dependence ([place])   Alcohol withdrawal ([place])   Cocaine use disorder ([place])   Bipolar affective disorder ([place])  Past Psychiatric History: As above  Past Medical History:  Past Medical History:  Diagnosis Date   Alcohol abuse    Anxiety    Bipolar 2 disorder ([place])    Depression     Past Surgical History:  Procedure Laterality Date   HAND SURGERY Right    Family History:  Family History  Problem Relation Age of Onset   Anxiety disorder Mother    Alcohol abuse Father    Anxiety disorder Maternal Aunt    Anxiety disorder Maternal Grandmother    Alcohol abuse Paternal Grandfather    Family Psychiatric  History: as above Social History:  Social History   Substance and Sexual Activity  Alcohol Use Yes   Alcohol/week: 2.0 standard drinks   Types: 2  Cans of beer per week   Comment: 48 beers a week     Social History   Substance and Sexual Activity  Drug Use Not Currently   Types: Marijuana, Cocaine   Comment: cocaine in last month    Social History   Socioeconomic History   Marital status: Single    Spouse name: Not on file   Number of children: Not on file   Years of education: Not on file   Highest education level: Not on file  Occupational History   Not on file  Tobacco Use   Smoking status: Every Day    Packs/day: 1.50    Types: Cigarettes   Smokeless tobacco: Former    Types: Snuff  Vaping Use   Vaping Use: Never used  Substance and Sexual Activity   Alcohol use: Yes    Alcohol/week: 2.0 standard drinks    Types: 2 Cans of beer per week    Comment: 48 beers a week   Drug use: Not Currently    Types: Marijuana, Cocaine    Comment: cocaine in last month   Sexual activity: Not Currently  Other Topics Concern   Not on file  Social History Narrative   Not on file   Social Determinants of Health   Financial Resource Strain: Not on file  Food Insecurity: Not on file  Transportation Needs: Not on file  Physical Activity: Not on file  Stress: Not on file  Social Connections: Not  on file                                       HOSPITAL COURSE During the patient's hospitalization, patient had extensive initial psychiatric evaluation, and follow-up psychiatric evaluations every day. Diagnoses provided & medications started upon initial assessment was as follows:  -Continue Tegretol chewable tabs 100 mg po tid for seizure disorder.  - Continue Duloxetine 40 mg po daily for depression.  - Continue gabapentin 300 mg po tid for alcohol withdrawal syndrome.  - Hydroxyzine 50 mg po tid for anxiety.  - Continue Protonix 40 mg po Q am for GERD.  - Continue Trazodone 100 mg po Q hs prn for insomnia.  - Continue nicotine patch 14 mg trans-dermally Q 24 hrs for nicotine withdrawal management.  - Continue multivitamin 1  tablet po daily for vitamin supplementation.   During the hospitalization, other adjustments were made to the patient's medication regimen, with the final medications at discharge being as follows: -Continue Tegretol chewable tabs 200 mg po tid for seizure disorder/mood stabilization - Continue Duloxetine 60 mg po daily for depression.  - Continue gabapentin 300 mg po tid for anxiety/ETOH use d/o - Continue Hydroxyzine 50 mg po tid for anxiety.  - Continue Protonix 40 mg po Q am for GERD.  - Continue Trazodone 100 mg po Q hs prn for insomnia.    Patient's care was discussed during the interdisciplinary team meeting every day during the hospitalization. The patient denies having side effects to prescribed psychiatric medication. The patient was evaluated each day by a clinical provider to ascertain response to treatment. Improvement was noted by the patient's report of decreasing symptoms, improved sleep and appetite, affect, medication tolerance, behavior, and participation in unit programming.  Patient was asked each day to complete a self inventory noting mood, mental status, pain, new symptoms, anxiety and concerns.     Symptoms were reported as significantly decreased or resolved completely by discharge. On day of discharge, the patient reports that their mood is stable. The patient denied having suicidal thoughts for more than 48 hours prior to discharge.  Patient denies having homicidal thoughts.  Patient denies having auditory hallucinations.  Patient denies any visual hallucinations or other symptoms of psychosis. The patient was motivated to continue taking medication with a goal of continued improvement in mental health.    The patient reports their target psychiatric symptoms of depression & anxiety responded well to the psychiatric medications, and the patient reports overall benefit from this psychiatric hospitalization. Supportive psychotherapy was provided to the patient. The patient  also participated in regular group therapy while hospitalized. Coping skills, problem solving as well as relaxation therapies were also part of the unit programming.   Labs were reviewed with the patient, and abnormal results were discussed with the patient.  Physical Findings: AIMS: Facial and Oral Movements Muscles of Facial Expression: None, normal Lips and Perioral Area: None, normal Jaw: None, normal Tongue: None, normal,Extremity Movements Upper (arms, wrists, hands, fingers): None, normal Lower (legs, knees, ankles, toes): None, normal, Trunk Movements Neck, shoulders, hips: None, normal, Overall Severity Severity of abnormal movements (highest score from questions above): None, normal Incapacitation due to abnormal movements: None, normal Patient's awareness of abnormal movements (rate only patient's report): No Awareness, Dental Status Current problems with teeth and/or dentures?: No Does patient usually wear dentures?: No  CIWA:  CIWA-Ar Total: 3 COWS:  n/a  Musculoskeletal: Strength & Muscle Tone: within normal limits Gait & Station: normal Patient leans: N/A  Psychiatric Specialty Exam:  Presentation  General Appearance: Appropriate for Environment; Fairly Groomed  Eye Contact:Good  Speech:Clear and Coherent  Speech Volume:Normal  Handedness:Right  Mood and Affect  Mood:Euthymic  Affect:Appropriate  Thought Process  Thought Processes:Coherent  Descriptions of Associations:Intact  Orientation:Full (Time, Place and Person)  Thought Content:Logical  History of Schizophrenia/Schizoaffective disorder:No  Duration of Psychotic Symptoms:No data recorded Hallucinations:Hallucinations: None  Ideas of Reference:None  Suicidal Thoughts:Suicidal Thoughts: No  Homicidal Thoughts:Homicidal Thoughts: No  Sensorium  Memory:Immediate Good; Recent Good  Judgment:Good  Insight:Good  Executive Functions  Concentration:Good  Attention  Span:Good  [place] of Knowledge:Good  Language:Good  Psychomotor Activity  Psychomotor Activity:Psychomotor Activity: Normal  Assets  Assets:Communication Skills  Sleep  Sleep:Sleep: Good  Physical Exam: Physical Exam Constitutional:      Appearance: Normal appearance.  HENT:     Nose: Nose normal. No congestion.  Eyes:     Pupils: Pupils are equal, round, and reactive to light.  Pulmonary:     Effort: Pulmonary effort is normal.  Musculoskeletal:        General: Normal range of motion.     Cervical back: Normal range of motion.  Neurological:     Mental Status: He is alert and oriented to person, place, and time.     Sensory: No sensory deficit.     Coordination: Coordination normal.  Psychiatric:        Behavior: Behavior normal.   Review of Systems  Constitutional: Negative.  Negative for fever.  HENT: Negative.  Negative for sore throat.   Eyes: Negative.   Respiratory: Negative.  Negative for cough.   Cardiovascular: Negative.  Negative for chest pain.  Gastrointestinal: Negative.   Genitourinary: Negative.   Musculoskeletal: Negative.   Skin: Negative.   Neurological: Negative.   Psychiatric/Behavioral:  Positive for depression (Patient reports that his depressive symptoms are resolving. He denies SI/HI/AVH, and is verbally contracting for safety outside of this [facility]) and substance abuse (Pt refused referral to alcohol treatment/rehab facilities during this admission. Education on cessation given). Negative for hallucinations and memory loss. Nervous/anxious: improving with current medications. Insomnia: improving on current meds.   Blood pressure 106/69, pulse 79, temperature (!) 97.4 F (36.3 C), temperature source Oral, resp. rate 16, height 6' 0.1" (1.831 m), weight 83 kg, SpO2 100 %. Body mass index is 24.75 kg/m.  Social History   Tobacco Use  Smoking Status Every Day   Packs/day: 1.50   Types: Cigarettes  Smokeless Tobacco Former    Types: Snuff   Tobacco Cessation:  A prescription for an FDA-approved tobacco cessation medication provided at discharge  Blood Alcohol level:  Lab Results  Component Value Date   [id number] ([place]) 05/10/2022   ETH 364 ([place]) 0000000   Metabolic Disorder Labs:  Lab Results  Component Value Date   HGBA1C 5.4 05/14/2022   MPG 108.28 05/14/2022   MPG 102.54 10/25/2021   No results found for: PROLACTIN Lab Results  Component Value Date   CHOL 196 05/14/2022   TRIG 93 05/14/2022   HDL 57 05/14/2022   CHOLHDL 3.4 05/14/2022   VLDL 19 05/14/2022   LDLCALC 120 (H) 05/14/2022   [place] 93 10/25/2021   See Psychiatric Specialty Exam and Suicide Risk Assessment completed by Attending Physician prior to discharge.  Discharge destination:  Home  Is patient on multiple antipsychotic therapies at discharge:  No  Has Patient had three or more failed trials of antipsychotic monotherapy by history:  No  Recommended Plan for Multiple Antipsychotic Therapies: NA  Allergies as of 05/18/2022   No Known Allergies      Medication List     STOP taking these medications    carbamazepine 100 MG chewable tablet Commonly known as: TEGRETOL Replaced by: carbamazepine 200 MG tablet   dicyclomine 10 MG capsule Commonly known as: BENTYL   multivitamin with minerals Tabs tablet       TAKE these medications      Indication  carbamazepine 200 MG tablet Commonly known as: TEGRETOL Take 1 tablet (200 mg total) by mouth 3 (three) times daily. Replaces: carbamazepine 100 MG chewable tablet  Indication: Manic-Depression, seizure prevention   DULoxetine 60 MG capsule Commonly known as: CYMBALTA Take 1 capsule (60 mg total) by mouth daily. Start taking on: May 19, 2022 What changed:  medication strength how much to take  Indication: Major Depressive Disorder, Disease of the Peripheral Nerves   gabapentin 300 MG capsule Commonly known as: NEURONTIN Take 1 capsule (300 mg total) by  mouth 3 (three) times daily.  Indication: Abuse or Misuse of Alcohol   hydrOXYzine 50 MG tablet Commonly known as: ATARAX Take 1 tablet (50 mg total) by mouth 3 (three) times daily as needed for anxiety.  Indication: Feeling Anxious   nicotine 14 mg/24hr patch Commonly known as: NICODERM CQ - dosed in mg/24 hours Place 1 patch (14 mg total) onto the skin daily. Start taking on: May 19, 2022  Indication: Nicotine Addiction   pantoprazole 40 MG tablet Commonly known as: PROTONIX Take 1 tablet (40 mg total) by mouth daily.  Indication: Gastroesophageal Reflux Disease   traZODone 100 MG tablet Commonly known as: DESYREL Take 1 tablet (100 mg total) by mouth at bedtime as needed for sleep.  Indication: [place]. Go on 05/23/2022.   Specialty: Behavioral Health Why: You have an appointment to obtain therapy and medication management services on 05/23/22  at 7:30 am.  Services are provided on a first come, first served basis. Contact information: Florien Douglass [phone]               Follow-up recommendations:   The patient is able to verbalize their individual safety plan to this provider.   # It is recommended to the patient to continue psychiatric medications as prescribed, after discharge from the hospital.     # It is recommended to the patient to follow up with your outpatient psychiatric provider and PCP.   # It was discussed with the patient, the impact of alcohol, drugs, tobacco have been there overall psychiatric and medical wellbeing, and total abstinence from substance use was recommended the patient.ed.   # Prescriptions provided or sent directly to preferred pharmacy at discharge. Patient agreeable to plan. Given opportunity to ask questions. Appears to feel comfortable with discharge.    # In the event of worsening symptoms, the patient is  instructed to call the crisis hotline (988), 911 and or go to the nearest ED for appropriate evaluation and treatment of symptoms. To follow-up with primary care provider for other medical issues, concerns and or health care needs   # Patient was discharged to his mother's home with a plan to follow up as noted above.    Signed: Nicholes Rough, NP 05/18/2022,  11:24 AM

## 2022-05-18 NOTE — BH IP Treatment Plan (Signed)
Interdisciplinary Treatment and Diagnostic Plan Update  05/18/2022 Time of Session: 0830 Lynwood Kubisiak MRN: [id number]  Principal Diagnosis: Bipolar I disorder, most recent episode depressed (HCC)  Secondary Diagnoses: Principal Problem:   Bipolar I disorder, most recent episode depressed (HCC) Active Problems:   GAD (generalized anxiety disorder)   Alcohol use disorder, severe, dependence (HCC)   Alcohol withdrawal (HCC)   Cocaine use disorder (HCC)   Bipolar affective disorder (HCC)   Current Medications:  Current Facility-Administered Medications  Medication Dose Route Frequency Provider Last Rate Last Admin   carbamazepine (TEGRETOL) tablet 200 mg  200 mg Oral TID Phineas Inches, MD   200 mg at 05/18/22 0740   DULoxetine (CYMBALTA) DR capsule 60 mg  60 mg Oral Daily Massengill, Harrold Donath, MD   60 mg at 05/18/22 0740   gabapentin (NEURONTIN) capsule 300 mg  300 mg Oral TID Armandina Stammer I, NP   300 mg at 05/18/22 0740   hydrOXYzine (ATARAX) tablet 50 mg  50 mg Oral TID PRN Armandina Stammer I, NP   50 mg at 05/18/22 0915   multivitamin with minerals tablet 1 tablet  1 tablet Oral Daily Armandina Stammer I, NP   1 tablet at 05/18/22 0740   nicotine (NICODERM CQ - dosed in mg/24 hours) patch 14 mg  14 mg Transdermal Daily [place], Ene A, NP   14 mg at 05/18/22 0741   pantoprazole (PROTONIX) EC tablet 40 mg  40 mg Oral Daily Nwoko, Nicole Kindred I, NP   40 mg at 05/18/22 0740   thiamine tablet 100 mg  100 mg Oral Daily Starleen Blue, NP   100 mg at 05/18/22 0740   traZODone (DESYREL) tablet 100 mg  100 mg Oral QHS PRN Armandina Stammer I, NP   100 mg at 05/17/22 2119   PTA Medications: Medications Prior to Admission  Medication Sig Dispense Refill Last Dose   carbamazepine (TEGRETOL) 100 MG chewable tablet Chew 1 tablet (100 mg total) by mouth 3 (three) times daily. (Patient not taking: Reported on 05/11/2022) 90 tablet 0    dicyclomine (BENTYL) 10 MG capsule Take 1 capsule (10 mg total) by mouth  4 (four) times daily -  before meals and at bedtime. (Patient not taking: Reported on 05/11/2022) 40 capsule 0    DULoxetine HCl 40 MG CPEP Take 40 mg by mouth daily. (Patient not taking: Reported on 05/11/2022) 30 capsule 0    Multiple Vitamin (MULTIVITAMIN WITH MINERALS) TABS tablet Take 1 tablet by mouth daily.      [DISCONTINUED] gabapentin (NEURONTIN) 300 MG capsule Take 1 capsule (300 mg total) by mouth 3 (three) times daily. (Patient not taking: Reported on 05/11/2022) 90 capsule 0    [DISCONTINUED] hydrOXYzine (ATARAX/VISTARIL) 50 MG tablet Take 1 tablet (50 mg total) by mouth 3 (three) times daily as needed for anxiety. (Patient not taking: Reported on 05/11/2022) 30 tablet 0    [DISCONTINUED] pantoprazole (PROTONIX) 40 MG tablet Take 1 tablet (40 mg total) by mouth daily. (Patient not taking: Reported on 05/11/2022) 30 tablet 0    [DISCONTINUED] traZODone (DESYREL) 100 MG tablet Take 1 tablet (100 mg total) by mouth at bedtime as needed for sleep. (Patient not taking: Reported on 05/11/2022) 30 tablet 0     Patient Stressors: Financial difficulties   Marital or family conflict   Medication change or noncompliance   Occupational concerns   Substance abuse    Patient Strengths: Active sense of humor  Communication skills  Physical Health  Supportive family/friends  Treatment Modalities: Medication Management, Group therapy, Case management,  1 to 1 session with clinician, Psychoeducation, Recreational therapy.   Physician Treatment Plan for Primary Diagnosis: Bipolar I disorder, most recent episode depressed (HCC) Long Term Goal(s): Improvement in symptoms so as ready for discharge   Short Term Goals: Ability to identify changes in lifestyle to reduce recurrence of condition will improve Ability to verbalize feelings will improve Ability to disclose and discuss suicidal ideas Ability to identify and develop effective coping behaviors will improve Compliance with prescribed  medications will improve Ability to identify triggers associated with substance abuse/mental health issues will improve  Medication Management: Evaluate patient's response, side effects, and tolerance of medication regimen.  Therapeutic Interventions: 1 to 1 sessions, Unit Group sessions and Medication administration.  Evaluation of Outcomes: Adequate for Discharge  Physician Treatment Plan for Secondary Diagnosis: Principal Problem:   Bipolar I disorder, most recent episode depressed (HCC) Active Problems:   GAD (generalized anxiety disorder)   Alcohol use disorder, severe, dependence (HCC)   Alcohol withdrawal (HCC)   Cocaine use disorder (HCC)   Bipolar affective disorder (HCC)  Long Term Goal(s): Improvement in symptoms so as ready for discharge   Short Term Goals: Ability to identify changes in lifestyle to reduce recurrence of condition will improve Ability to verbalize feelings will improve Ability to disclose and discuss suicidal ideas Ability to identify and develop effective coping behaviors will improve Compliance with prescribed medications will improve Ability to identify triggers associated with substance abuse/mental health issues will improve     Medication Management: Evaluate patient's response, side effects, and tolerance of medication regimen.  Therapeutic Interventions: 1 to 1 sessions, Unit Group sessions and Medication administration.  Evaluation of Outcomes: Adequate for Discharge   RN Treatment Plan for Primary Diagnosis: Bipolar I disorder, most recent episode depressed (HCC) Long Term Goal(s): Knowledge of disease and therapeutic regimen to maintain health will improve  Short Term Goals: Ability to remain free from injury will improve, Ability to verbalize frustration and anger appropriately will improve, Ability to demonstrate self-control, Ability to participate in decision making will improve, Ability to verbalize feelings will improve, Ability to  disclose and discuss suicidal ideas, Ability to identify and develop effective coping behaviors will improve, and Compliance with prescribed medications will improve  Medication Management: RN will administer medications as ordered by provider, will assess and evaluate patient's response and provide education to patient for prescribed medication. RN will report any adverse and/or side effects to prescribing provider.  Therapeutic Interventions: 1 on 1 counseling sessions, Psychoeducation, Medication administration, Evaluate responses to treatment, Monitor vital signs and CBGs as ordered, Perform/monitor CIWA, COWS, AIMS and Fall Risk screenings as ordered, Perform wound care treatments as ordered.  Evaluation of Outcomes: Adequate for Discharge   LCSW Treatment Plan for Primary Diagnosis: Bipolar I disorder, most recent episode depressed (HCC) Long Term Goal(s): Safe transition to appropriate next level of care at discharge, Engage patient in therapeutic group addressing interpersonal concerns.  Short Term Goals: Engage patient in aftercare planning with referrals and resources, Increase social support, Increase ability to appropriately verbalize feelings, Increase emotional regulation, Facilitate acceptance of mental health diagnosis and concerns, Facilitate patient progression through stages of change regarding substance use diagnoses and concerns, Identify triggers associated with mental health/substance abuse issues, and Increase skills for wellness and recovery  Therapeutic Interventions: Assess for all discharge needs, 1 to 1 time with Social worker, Explore available resources and support systems, Assess for adequacy in community support network, Educate family  and significant other(s) on suicide prevention, Complete Psychosocial Assessment, Interpersonal group therapy.  Evaluation of Outcomes: Adequate for Discharge   Progress in Treatment: Attending groups: Yes. Participating in groups:  Yes. Taking medication as prescribed: Yes. Toleration medication: Yes. Family/Significant other contact made: Yes, individual(s) contacted:  SPE Completed with Audria Nineeborah Weisel, Mother.  Patient understands diagnosis: Yes. Discussing patient identified problems/goals with staff: Yes. Medical problems stabilized or resolved: Yes. Denies suicidal/homicidal ideation: Yes. Issues/concerns per patient self-inventory: Yes. Other: none  New problem(s) identified: No, Describe:  No additional problems/concerns identified at this time.   New Short Term/Long Term Goal(s): Patient scheduled for discharge on this day.   Patient Goals:  No additional goals identified at this time. Patient to continue to work towards original goals identified in initial treatment team meeting. CSW will remain available to patient should they voice additional treatment goals.   Discharge Plan or Barriers: No psychosocial barriers identified at this time, patient to return to place of residence w/ friend when appropriate for discharge.   Reason for Continuation of Hospitalization: N/A, Patient to discharge on this day.   Last 3 [place] Suicide Severity Risk Score: Flowsheet Row [facility]  C-SSRS RISK CATEGORY No Risk No Risk No Risk       Last PHQ 2/9 Scores:    04/12/2021    2:51 PM 02/09/2019    2:22 PM  Depression screen PHQ 2/9  Decreased Interest 2 2  Down, Depressed, Hopeless 3 3  PHQ - 2 Score 5 5  Altered sleeping 3 3  Tired, decreased energy 2 2  Change in appetite 2 2  Feeling bad or failure about yourself  3 3  Trouble concentrating 3 2  Moving slowly or fidgety/restless 3 2  Suicidal thoughts 0 1  PHQ-9 Score 21 20    Scribe for Treatment Team: Almedia BallsMichael W Lina Hitch, LCSWA 05/18/2022 11:33  AM

## 2022-05-18 NOTE — Progress Notes (Signed)
   05/18/22 0900  Psych Admission Type (Psych Patients Only)  Admission Status Involuntary  Psychosocial Assessment  Patient Complaints Anxiety;Depression  Eye Contact Fair  Facial Expression Animated;Anxious  Affect Anxious  Speech Press photographer  Appearance/Hygiene Improved  Behavior Characteristics Cooperative;Anxious  Mood Depressed;Anxious  Thought Process  Coherency WDL  Content WDL  Delusions None reported or observed  Perception WDL  Hallucination None reported or observed  Judgment WDL  Confusion None  Danger to Self  Current suicidal ideation? Denies  Agreement Not to Harm Self Yes  Description of Agreement Verbal  Danger to Others  Danger to Others None reported or observed

## 2022-05-18 NOTE — Progress Notes (Signed)
Patient discharged. Reviewed discharge instructions with patient. Patient verbalized understanding. Patient received all personal belongings. Patient received sample medications. At discharge patient denies SI/HI/AVH. Patient left unit at 1600.

## 2022-05-18 NOTE — Group Note (Signed)
Date:  05/18/2022 Time:  9:54 AM  Group Topic/Focus:  Orientation:   The focus of this group is to educate the patient on the purpose and policies of crisis stabilization and provide a format to answer questions about their admission.  The group details unit policies and expectations of patients while admitted.    Participation Level:  Active  Participation Quality:  Appropriate  Affect:  Appropriate  Cognitive:  Appropriate  Insight: Appropriate  Engagement in Group:  Engaged  Modes of Intervention:  Discussion  Additional Comments:    Jaquita Rector 05/18/2022, 9:54 AM

## 2022-05-21 LAB — CARBAMAZEPINE, FREE AND TOTAL

## 2022-05-29 ENCOUNTER — Encounter (HOSPITAL_COMMUNITY): Payer: Self-pay | Admitting: Student

## 2022-05-29 ENCOUNTER — Ambulatory Visit (HOSPITAL_COMMUNITY)
Admission: EM | Admit: 2022-05-29 | Discharge: 2022-05-29 | Disposition: A | Discharge status: Home / Self Care | Payer: No Payment, Other | Attending: Psychiatry | Admitting: Psychiatry

## 2022-05-29 DIAGNOSIS — F411 Generalized anxiety disorder: Secondary | ICD-10-CM | POA: Diagnosis not present

## 2022-05-29 DIAGNOSIS — F10129 Alcohol abuse with intoxication, unspecified: Secondary | ICD-10-CM | POA: Diagnosis not present

## 2022-05-29 DIAGNOSIS — F10920 Alcohol use, unspecified with intoxication, uncomplicated: Secondary | ICD-10-CM

## 2022-05-29 NOTE — Progress Notes (Signed)
   05/29/22 1220  [facility] Triage Screening (Walk-ins at [facility] only)  How Did You Hear About [place]? Self  What Is the Reason for Your Visit/Call Today? Patient is a 31 y.o. male with a hx of Bipolar Disorder, Alcohol Use Disorder and Seizure Disorder who  How Long Has This Been Causing You Problems? 1 wk - 1 month  Have You Recently Had Any Thoughts About Hurting Yourself? Yes  How long ago did you have thoughts about hurting yourself? yesterday- passive SI  Are You Planning to Commit Suicide/Harm Yourself At This time? No  Have you Recently Had Thoughts About Hurting Someone [name]? No  Are You Planning To Harm Someone At This Time? No  Are you currently experiencing any auditory, visual or other hallucinations? Yes  Please explain the hallucinations you are currently experiencing: Hears "maybe my mind, it's my voice saying don't do anything"  Have You Used Any Alcohol or Drugs in the Past 24 Hours? Yes  How long ago did you use Drugs or Alcohol? PTA  What Did You Use and How Much? 1 beer PTA  Do you have any current medical co-morbidities that require immediate attention? Yes  Please describe current medical co-morbidities that require immediate attention: hx of cirrhosis and seizure disorder  Clinician description of patient physical appearance/behavior: Patient appears disheveled, anxious and AAOx5  What Do You Feel Would Help You the Most Today? Treatment for Depression or other mood problem;Alcohol or Drug Use Treatment  If access to [facility] was not available, would you have sought care in the Emergency Department? Yes  Determination of Need Urgent (48 hours)  Options For Referral Intensive Outpatient Therapy;Medication Management;Outpatient Therapy;BH Urgent Care;Facility-Based Crisis

## 2022-05-29 NOTE — ED Provider Notes (Addendum)
Behavioral Health Urgent Care Medical Screening Exam  Patient Name: John Vaughan MRN: [id number] Date of Evaluation: 05/29/22 Chief Complaint:   "my family and anxiety" Diagnosis:  Final diagnoses:  Alcoholic intoxication without complication (HCC)  GAD (generalized anxiety disorder)    History of Present illness: John Vaughan is a 31 y.o. male.  Presented to [facility] (05/29/2022) voluntarily after asking mom to bring him.  Decided to come here 2 days ago, on Sunday after reportedly having a "grand mal seizure" in the setting of cocaine use, stated that he went to the [facility], who d/c'd that day. Since then, reported having 3x "massive seizures" that he described with sxs of sweating, gagging, twitching, shakes. Reported having memory of and able to control the twitches, and was witnessed by mom.  Stated that his #1 problem is, "I want my family to stop using drugs". Unable to answer why this was his main problems vs his drinking or cocaine use. "It is complicated, I don't know how to explain it."  In response to what would he want help with, "I don't know". In response to if his goal was [facility] admission, "it was helpful because I could talk to people who related to to me, and I could talk to my family from a distance". Since [facility] (May 21 - 26), reported med adherence, but unable to afford his medications as he doesn't work. However reported he gets disability checks for mental health. Repeatedly said, "I'm scared of everything" but would not give specifics despite prompting.  Patient declined meds for etoh abuse and other substance abuse resources. However he was amenable to outpatient follow-up, saying "I will 10/10 be able to show up (0/10 being no chance)".    2nd encounter with attending, " I have a problem with shutting down and not talking. I want to be agreeable with everyone but cannot.  I did nervous and can't put myself out there."  Reported after [facility], was able to make friends,  "but I was too strong, so I don't know, i'm still trying to find myself".   Then opened up, "I think i'm not straight and I don;t know how to tell my family". Patient's biggest worry about telling family is that they will think it is a joke.   Patient denied SI/HI/AVH. Denied access or having a gun, his family took it away 2 years ago and hid it. Stated that he is able to be safe if he goes home with mom.   Psychiatric Specialty Exam  Presentation  General Appearance:Disheveled (hair disheveled, ungroomed facial hair. Behavior was anxious, closed off, vague answers)  Eye Contact:Poor (Would mostly look at the ground, glancing up intermittently briefly)  Speech:Slow; Blocked (Coherent but would answer in 1-2 words, sometimes short phrases. Mainly, "ums", "it's complicated", "i don't know")  Speech Volume:Normal  Handedness:Right   Mood and Affect  Mood:Anxious  Affect:Congruent; Tearful; Labile; Depressed   Thought Process  Thought Processes:Irrevelant  Descriptions of Associations:Circumstantial  Orientation:Full (Time, Place and Person)  Thought Content:Rumination; Scattered; Illogical (" Scared of everything". ruminates on family's substance use and his somatic sxs from anxiety.)  Diagnosis of Schizophrenia or Schizoaffective disorder in past: No  Duration of Psychotic Symptoms: N/A  Hallucinations:None (Denied)  Ideas of Reference:None  Suicidal Thoughts:No Without Plan Without Intent; Without Plan  Homicidal Thoughts:No   Sensorium  Memory:Immediate Fair; Recent Fair  Judgment:Fair (reported adherent to meds)  Insight:Shallow   Executive Functions  Concentration:Fair  Attention Span:Fair  Recall:Fair  Fund of  Knowledge:Fair  Language:Fair   Psychomotor Activity  Psychomotor Activity:Restlessness (leg shaking when anxious)   Assets  Assets:Social Support; Leisure Time; Housing; Desire for Improvement   Sleep  Sleep:Fair  Number of hours:  8   No data recorded  Physical Exam: Physical Exam Vitals and nursing note reviewed.  Constitutional:      General: He is not in acute distress.    Appearance: He is not ill-appearing or diaphoretic.  HENT:     Head: Normocephalic.  Pulmonary:     Effort: Pulmonary effort is normal. No respiratory distress.  Neurological:     General: No focal deficit present.     Mental Status: He is alert.  Psychiatric:        Behavior: Behavior is cooperative.    Review of Systems  Respiratory:  Negative for shortness of breath.   Cardiovascular:  Negative for chest pain.  Gastrointestinal:  Negative for nausea and vomiting.  Neurological:  Negative for dizziness and headaches.  Blood pressure 121/81, pulse 78, temperature 97.9 F (36.6 C), temperature source Oral, resp. rate 16, SpO2 97 %. There is no height or weight on file to calculate BMI.  Musculoskeletal: Strength & Muscle Tone: within normal limits Gait & Station: normal Patient leans: N/A   A/P:  John Vaughan is a 31 y.o. male with PMH alcohol abuse with withdrawal seizures, cocaine abuse, GAD, SAD, NSSIB, multiple psych hospitalization presented to [facility] (05/29/2022) voluntarily after asking mom to bring him. Patient was not forthcoming with information and triggers and was vague about presenting sxs. Did open up at the end, stating there has been tension about opening up with family about his sexuality. Unable to get in contact with mom for collateral.   Routine, patient does not meet requirement for inpatient psych. Denied SI/HI/AVH.   Plan to dc patient home with mom with resources.   [facility] MSE Discharge Disposition for Follow up and Recommendations: Based on my evaluation the patient does not appear to have an emergency medical condition and can be discharged with resources and follow up care in outpatient services for Medication Management, Individual Therapy, Group Therapy, and outpatient psychiatry follow-up,  LGBTQ resources   Princess Bruins, DO 05/29/2022, 3:42 PM
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
## 2022-05-29 NOTE — Discharge Instructions (Addendum)
Based on observation, you will be discharged and provided the following resources based on your need.  It is important that you follow-through with treatment recommendations provided within 7-10 business days to prevent relapse in symptoms and behaviors.  The following providers are recommended but you are not limited to what is provided here.  In case of an urgent emergency, you have the option of contacting the Mobile Crisis Unit with [facility] at [phone].   Medication Management and Therapy Services  [facility] - Outpatient is upstairs on the 2nd floor. [address], [place], [address] [phone] phone  Walk-ins only  For Therapy Mon-Weds 8am until the slots are full Fri - 1pm-5pm  For Mon-Weds walk-ins, we ask that you arrive by 7:30am-7:45am because patients will be seen in the order of their arrival.  For Friday, we ask that you come in at 12pm-12:3pm.  Availability is limited, therefore, you may not be seen on the same day.  For Medication Management Mon-Fri 8am-11am Please be here by 7:30am-7:45am as patients will be seen in the order of arrival.   Other resources for med management and therapy.   [facility] [address] [place], [place], [address] [phone] phone [phone] fax  [facility]             [address] [place], [place], [address] [phone] phone  [facility] [address], [place], [address] [phone] phone  [facility] [address] [place], [place], [address] [phone] phone   LGBTQ Resources  [email or web address] Burgess [employer] [address], [place], [address] [phone] phone  [email or web address]

## 2022-05-29 NOTE — BH Assessment (Signed)
Per Princess Bruins, DO, this pt does not require psychiatric hospitalization at this time.  Pt is psychiatrically cleared.  Discharge instructions include resources for psychiatric services and therapy along with the number for the mobile crisis unit in case of an emergency.  A community resource for LGBTQ individuals was included in the discharge instructions for him to look into for networking and other services. Princess Bruins, DO has been notified.  Hansel Starling, MSW, LCSW [facility] [phone] or [phone]

## 2022-06-11 ENCOUNTER — Other Ambulatory Visit: Payer: Self-pay

## 2022-06-11 ENCOUNTER — Encounter (HOSPITAL_COMMUNITY): Payer: Self-pay

## 2022-06-11 ENCOUNTER — Emergency Department (HOSPITAL_COMMUNITY)
Admission: EM | Admit: 2022-06-11 | Discharge: 2022-06-12 | Disposition: A | Discharge status: Other Acute Inpatient Hospital | Payer: Self-pay | Attending: Emergency Medicine | Admitting: Emergency Medicine

## 2022-06-11 DIAGNOSIS — E872 Acidosis, unspecified: Secondary | ICD-10-CM | POA: Insufficient documentation

## 2022-06-11 DIAGNOSIS — F1910 Other psychoactive substance abuse, uncomplicated: Secondary | ICD-10-CM

## 2022-06-11 DIAGNOSIS — Z20822 Contact with and (suspected) exposure to covid-19: Secondary | ICD-10-CM | POA: Insufficient documentation

## 2022-06-11 DIAGNOSIS — N179 Acute kidney failure, unspecified: Secondary | ICD-10-CM | POA: Insufficient documentation

## 2022-06-11 DIAGNOSIS — Y908 Blood alcohol level of 240 mg/100 ml or more: Secondary | ICD-10-CM | POA: Insufficient documentation

## 2022-06-11 DIAGNOSIS — R45851 Suicidal ideations: Secondary | ICD-10-CM | POA: Insufficient documentation

## 2022-06-11 DIAGNOSIS — F1010 Alcohol abuse, uncomplicated: Secondary | ICD-10-CM | POA: Insufficient documentation

## 2022-06-11 DIAGNOSIS — F1410 Cocaine abuse, uncomplicated: Secondary | ICD-10-CM | POA: Insufficient documentation

## 2022-06-11 LAB — COMPREHENSIVE METABOLIC PANEL
ALT: 19 U/L (ref 0–44)
AST: 25 U/L (ref 15–41)
Albumin: 4.2 g/dL (ref 3.5–5.0)
Alkaline Phosphatase: 56 U/L (ref 38–126)
Anion gap: 11 (ref 5–15)
BUN: 13 mg/dL (ref 6–20)
CO2: 20 mmol/L — ABNORMAL LOW (ref 22–32)
Calcium: 9 mg/dL (ref 8.9–10.3)
Chloride: 108 mmol/L (ref 98–111)
Creatinine, Ser: 1.32 mg/dL — ABNORMAL HIGH (ref 0.61–1.24)
GFR, Estimated: >60 mL/min (ref >60)
Glucose, Bld: 102 mg/dL — ABNORMAL HIGH (ref 70–99)
Potassium: 4 mmol/L (ref 3.5–5.1)
Sodium: 139 mmol/L (ref 135–145)
Total Bilirubin: 0.6 mg/dL (ref 0.3–1.2)
Total Protein: 7.4 g/dL (ref 6.5–8.1)

## 2022-06-11 LAB — CBC
HCT: 42.8 % (ref 39.0–52.0)
Hemoglobin: 15.2 g/dL (ref 13.0–17.0)
MCH: 33.8 pg (ref 26.0–34.0)
MCHC: 35.5 g/dL (ref 30.0–36.0)
MCV: 95.1 fL (ref 80.0–100.0)
Platelets: 265 10*3/uL (ref 150–400)
RBC: 4.5 MIL/uL (ref 4.22–5.81)
RDW: 11.9 % (ref 11.5–15.5)
WBC: 4.9 10*3/uL (ref 4.0–10.5)
nRBC: 0 % (ref 0.0–0.2)

## 2022-06-11 LAB — RAPID URINE DRUG SCREEN, HOSP PERFORMED
Amphetamines: NOT DETECTED
Barbiturates: NOT DETECTED
Benzodiazepines: NOT DETECTED
Cocaine: POSITIVE — AB
Opiates: NOT DETECTED
Tetrahydrocannabinol: NOT DETECTED

## 2022-06-11 LAB — SARS CORONAVIRUS 2 BY RT PCR: SARS Coronavirus 2 by RT PCR: NEGATIVE

## 2022-06-11 LAB — ETHANOL: Alcohol, Ethyl (B): 297 mg/dL — ABNORMAL HIGH (ref <10)

## 2022-06-11 LAB — SALICYLATE LEVEL: Salicylate Lvl: <7 mg/dL — ABNORMAL LOW (ref 7.0–30.0)

## 2022-06-11 LAB — ACETAMINOPHEN LEVEL: Acetaminophen (Tylenol), Serum: <10 ug/mL — ABNORMAL LOW (ref 10–30)

## 2022-06-11 MED ORDER — LORAZEPAM 2 MG/ML IJ SOLN: 1.0000 mg | INTRAMUSCULAR | Status: DC | PRN

## 2022-06-11 MED ORDER — HYDROXYZINE HCL 50 MG PO TABS: 50.0000 mg | ORAL_TABLET | Freq: Three times a day (TID) | ORAL | Status: DC | PRN

## 2022-06-11 MED ORDER — PANTOPRAZOLE SODIUM 40 MG PO TBEC
40.0000 mg | DELAYED_RELEASE_TABLET | Freq: Every day | ORAL | Status: DC
  Administered 2022-06-11: 40 mg via ORAL
  Filled 2022-06-11: qty 1

## 2022-06-11 MED ORDER — LACTATED RINGERS IV BOLUS
1000.0000 mL | Freq: Once | INTRAVENOUS | Status: AC
  Administered 2022-06-11: 1000 mL via INTRAVENOUS

## 2022-06-11 MED ORDER — FOLIC ACID 1 MG PO TABS
1.0000 mg | ORAL_TABLET | Freq: Every day | ORAL | Status: DC
  Administered 2022-06-11: 1 mg via ORAL
  Filled 2022-06-11: qty 1

## 2022-06-11 MED ORDER — CARBAMAZEPINE 200 MG PO TABS
200.0000 mg | ORAL_TABLET | Freq: Every day | ORAL | Status: DC
  Administered 2022-06-11: 200 mg via ORAL
  Filled 2022-06-11 (×2): qty 1

## 2022-06-11 MED ORDER — DULOXETINE HCL 60 MG PO CPEP
60.0000 mg | ORAL_CAPSULE | Freq: Every day | ORAL | Status: DC
  Administered 2022-06-11: 60 mg via ORAL
  Filled 2022-06-11 (×2): qty 1

## 2022-06-11 MED ORDER — THIAMINE HCL 100 MG PO TABS
100.0000 mg | ORAL_TABLET | Freq: Every day | ORAL | Status: DC
  Administered 2022-06-11: 100 mg via ORAL
  Filled 2022-06-11: qty 1

## 2022-06-11 MED ORDER — ADULT MULTIVITAMIN W/MINERALS CH
1.0000 | ORAL_TABLET | Freq: Every day | ORAL | Status: DC
  Administered 2022-06-11: 1 via ORAL
  Filled 2022-06-11: qty 1

## 2022-06-11 MED ORDER — THIAMINE HCL 100 MG/ML IJ SOLN: 100.0000 mg | Freq: Every day | INTRAMUSCULAR | Status: DC

## 2022-06-11 MED ORDER — CARBAMAZEPINE 200 MG PO TABS
200.0000 mg | ORAL_TABLET | Freq: Three times a day (TID) | ORAL | Status: DC
  Filled 2022-06-11: qty 1

## 2022-06-11 MED ORDER — TRAZODONE HCL 100 MG PO TABS: 100.0000 mg | ORAL_TABLET | Freq: Every evening | ORAL | Status: DC | PRN

## 2022-06-11 MED ORDER — GABAPENTIN 300 MG PO CAPS
300.0000 mg | ORAL_CAPSULE | Freq: Three times a day (TID) | ORAL | Status: DC
  Administered 2022-06-11 – 2022-06-12 (×3): 300 mg via ORAL
  Filled 2022-06-11 (×3): qty 1

## 2022-06-11 MED ORDER — NICOTINE 14 MG/24HR TD PT24
14.0000 mg | MEDICATED_PATCH | Freq: Every day | TRANSDERMAL | Status: DC
  Administered 2022-06-11: 14 mg via TRANSDERMAL
  Filled 2022-06-11: qty 1

## 2022-06-11 MED ORDER — LORAZEPAM 1 MG PO TABS
1.0000 mg | ORAL_TABLET | ORAL | Status: DC | PRN
  Administered 2022-06-11 (×3): 1 mg via ORAL
  Filled 2022-06-11 (×3): qty 1

## 2022-06-11 NOTE — Progress Notes (Signed)
Substance abuse resources attached to the AVS. A consult for TTS was put in place.

## 2022-06-11 NOTE — ED Notes (Signed)
Lunch order placed

## 2022-06-11 NOTE — ED Triage Notes (Signed)
Pt comes via [facility] for suicidal thoughts for the past 3 days, pt also has a hx of seizures and ETOH use, binge drinking yesterday, drank 36 beers.  Pt reports that he plans to cut himself, denies HI, has AVH. Reports cocaine use as well. Reports that he ran out of his Cymbalta last week.
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
## 2022-06-11 NOTE — ED Notes (Signed)
PATIENT BELONGS IS IN LOCKER #2

## 2022-06-11 NOTE — ED Notes (Signed)
Pt on call with TTS 

## 2022-06-11 NOTE — ED Notes (Signed)
Attempted to return call to pts mom, no answer at this time. Pt stated I had permission to provide her with updates
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
## 2022-06-11 NOTE — ED Notes (Signed)
MD Lawsing made aware of CIWA score.

## 2022-06-11 NOTE — ED Notes (Signed)
Called staffing requesting a sitter, staffing stated they have no sitters available to send

## 2022-06-12 ENCOUNTER — Other Ambulatory Visit: Payer: Self-pay

## 2022-06-12 ENCOUNTER — Ambulatory Visit (HOSPITAL_COMMUNITY)
Admission: EM | Admit: 2022-06-12 | Discharge: 2022-06-12 | Disposition: A | Discharge status: Other Acute Inpatient Hospital | Payer: No Payment, Other | Attending: Psychiatry | Admitting: Psychiatry

## 2022-06-12 DIAGNOSIS — F10920 Alcohol use, unspecified with intoxication, uncomplicated: Secondary | ICD-10-CM | POA: Diagnosis present

## 2022-06-12 DIAGNOSIS — F10229 Alcohol dependence with intoxication, unspecified: Secondary | ICD-10-CM | POA: Insufficient documentation

## 2022-06-12 DIAGNOSIS — F1020 Alcohol dependence, uncomplicated: Secondary | ICD-10-CM

## 2022-06-12 DIAGNOSIS — R45851 Suicidal ideations: Secondary | ICD-10-CM | POA: Insufficient documentation

## 2022-06-12 DIAGNOSIS — F1220 Cannabis dependence, uncomplicated: Secondary | ICD-10-CM

## 2022-06-12 DIAGNOSIS — F10129 Alcohol abuse with intoxication, unspecified: Secondary | ICD-10-CM | POA: Diagnosis present

## 2022-06-12 DIAGNOSIS — F69 Unspecified disorder of adult personality and behavior: Secondary | ICD-10-CM | POA: Diagnosis present

## 2022-06-12 DIAGNOSIS — Z720 Tobacco use: Secondary | ICD-10-CM

## 2022-06-12 DIAGNOSIS — F411 Generalized anxiety disorder: Secondary | ICD-10-CM | POA: Insufficient documentation

## 2022-06-12 DIAGNOSIS — Z79899 Other long term (current) drug therapy: Secondary | ICD-10-CM | POA: Insufficient documentation

## 2022-06-12 DIAGNOSIS — F332 Major depressive disorder, recurrent severe without psychotic features: Secondary | ICD-10-CM | POA: Insufficient documentation

## 2022-06-12 DIAGNOSIS — G40909 Epilepsy, unspecified, not intractable, without status epilepticus: Secondary | ICD-10-CM | POA: Insufficient documentation

## 2022-06-12 DIAGNOSIS — R202 Paresthesia of skin: Secondary | ICD-10-CM | POA: Insufficient documentation

## 2022-06-12 LAB — BASIC METABOLIC PANEL
Anion gap: 9 (ref 5–15)
BUN: 13 mg/dL (ref 6–20)
CO2: 24 mmol/L (ref 22–32)
Calcium: 9.9 mg/dL (ref 8.9–10.3)
Chloride: 104 mmol/L (ref 98–111)
Creatinine, Ser: 0.85 mg/dL (ref 0.61–1.24)
GFR, Estimated: >60 mL/min (ref >60)
Glucose, Bld: 128 mg/dL — ABNORMAL HIGH (ref 70–99)
Potassium: 3.4 mmol/L — ABNORMAL LOW (ref 3.5–5.1)
Sodium: 137 mmol/L (ref 135–145)

## 2022-06-12 LAB — FOLATE: Folate: 20.4 ng/mL (ref >5.9)

## 2022-06-12 LAB — VITAMIN B12: Vitamin B-12: 200 pg/mL (ref 180–914)

## 2022-06-12 MED ORDER — THIAMINE HCL 100 MG PO TABS: 100.0000 mg | ORAL_TABLET | Freq: Every day | ORAL | Status: DC

## 2022-06-12 MED ORDER — DULOXETINE HCL 60 MG PO CPEP
60.0000 mg | ORAL_CAPSULE | Freq: Every day | ORAL | Status: DC
  Administered 2022-06-12: 60 mg via ORAL
  Filled 2022-06-12: qty 1

## 2022-06-12 MED ORDER — LORAZEPAM 1 MG PO TABS
1.0000 mg | ORAL_TABLET | Freq: Four times a day (QID) | ORAL | Status: DC
  Administered 2022-06-12 (×4): 1 mg via ORAL
  Filled 2022-06-12 (×4): qty 1

## 2022-06-12 MED ORDER — LORAZEPAM 1 MG PO TABS: 1.0000 mg | ORAL_TABLET | Freq: Two times a day (BID) | ORAL | Status: DC

## 2022-06-12 MED ORDER — POTASSIUM CHLORIDE CRYS ER 20 MEQ PO TBCR
40.0000 meq | EXTENDED_RELEASE_TABLET | Freq: Once | ORAL | Status: AC
  Administered 2022-06-12: 40 meq via ORAL
  Filled 2022-06-12: qty 2

## 2022-06-12 MED ORDER — LORAZEPAM 1 MG PO TABS
1.0000 mg | ORAL_TABLET | Freq: Four times a day (QID) | ORAL | Status: DC | PRN
  Administered 2022-06-12: 1 mg via ORAL
  Filled 2022-06-12: qty 1

## 2022-06-12 MED ORDER — GABAPENTIN 300 MG PO CAPS
300.0000 mg | ORAL_CAPSULE | Freq: Three times a day (TID) | ORAL | Status: DC
  Administered 2022-06-12 (×3): 300 mg via ORAL
  Filled 2022-06-12 (×3): qty 1

## 2022-06-12 MED ORDER — CARBAMAZEPINE 200 MG PO TABS
200.0000 mg | ORAL_TABLET | Freq: Three times a day (TID) | ORAL | Status: DC
  Administered 2022-06-12 (×3): 200 mg via ORAL
  Filled 2022-06-12 (×3): qty 1

## 2022-06-12 MED ORDER — NICOTINE 14 MG/24HR TD PT24: 14.0000 mg | MEDICATED_PATCH | Freq: Every day | TRANSDERMAL | Status: DC

## 2022-06-12 MED ORDER — LORAZEPAM 1 MG PO TABS: 1.0000 mg | ORAL_TABLET | Freq: Every day | ORAL | Status: DC

## 2022-06-12 MED ORDER — ALUM & MAG HYDROXIDE-SIMETH 200-200-20 MG/5ML PO SUSP: 30.0000 mL | ORAL | Status: DC | PRN

## 2022-06-12 MED ORDER — ADULT MULTIVITAMIN W/MINERALS CH
1.0000 | ORAL_TABLET | Freq: Every day | ORAL | Status: DC
  Administered 2022-06-12: 1 via ORAL
  Filled 2022-06-12: qty 1

## 2022-06-12 MED ORDER — THIAMINE HCL 100 MG/ML IJ SOLN
100.0000 mg | Freq: Once | INTRAMUSCULAR | Status: AC
  Administered 2022-06-12: 100 mg via INTRAMUSCULAR
  Filled 2022-06-12: qty 2

## 2022-06-12 MED ORDER — ONDANSETRON 4 MG PO TBDP: 4.0000 mg | ORAL_TABLET | Freq: Four times a day (QID) | ORAL | Status: DC | PRN

## 2022-06-12 MED ORDER — LOPERAMIDE HCL 2 MG PO CAPS: 2.0000 mg | ORAL_CAPSULE | ORAL | Status: DC | PRN

## 2022-06-12 MED ORDER — LORAZEPAM 1 MG PO TABS: 1.0000 mg | ORAL_TABLET | Freq: Three times a day (TID) | ORAL | Status: DC

## 2022-06-12 MED ORDER — ACETAMINOPHEN 325 MG PO TABS: 650.0000 mg | ORAL_TABLET | Freq: Four times a day (QID) | ORAL | Status: DC | PRN

## 2022-06-12 MED ORDER — MAGNESIUM HYDROXIDE 400 MG/5ML PO SUSP: 30.0000 mL | Freq: Every day | ORAL | Status: DC | PRN

## 2022-06-12 MED ORDER — HYDROXYZINE HCL 25 MG PO TABS: 25.0000 mg | ORAL_TABLET | Freq: Four times a day (QID) | ORAL | Status: DC | PRN

## 2022-06-12 MED ORDER — PANTOPRAZOLE SODIUM 40 MG PO TBEC
40.0000 mg | DELAYED_RELEASE_TABLET | Freq: Every day | ORAL | Status: DC
  Administered 2022-06-12: 40 mg via ORAL
  Filled 2022-06-12: qty 1

## 2022-06-12 NOTE — Progress Notes (Signed)
Received John Vaughan this AM awake in his chair bed, restless with a Ciwa score of 5. He was cooperative with the blood draw and assessment questions. He endorsed feeling depressed  with thoughts of suicide without a definite plan.

## 2022-06-12 NOTE — ED Notes (Signed)
Pt mom asked for a callback once placement has been arranged for son, John Vaughan [phone].

## 2022-06-12 NOTE — ED Notes (Signed)
Report called to RN Erskine Squibb, [facility], [phone].  Librarian, academic.

## 2022-06-12 NOTE — ED Notes (Signed)
Pt was given breakfast and lunch

## 2022-06-12 NOTE — Progress Notes (Signed)
John Vaughan continues to sleep after lunch, he was cooperative with the additional blood draw and medication administration.

## 2022-06-12 NOTE — ED Notes (Signed)
Patient discharged in route to [facility].  3 bags of belongings given to transporter.

## 2022-06-12 NOTE — ED Provider Notes (Signed)
[facility] Continuous Assessment Admission H&P  Date: 06/12/22 Patient Name: John Vaughan MRN: [id number] Chief Complaint:  Chief Complaint  Patient presents with   Suicidal      Diagnoses:  Final diagnoses:  Suicidal ideation  Alcohol abuse with intoxication [facility])  Behavior concern in adult    HPI: John Vaughan, 31 y.o male, with a history of alcohol abuse disorder, bipolar disorder, MDD, prior suicide attempts, PTSD, GAD.  [facility] for suicide ideation.  Patient states he is suicidal with a plan to run his car into accident, patient was originally admitted to the hospital with intoxication  Copied from [facility] notes,  31 year old male with a history of alcohol use disorder, bipolar disorder and seizure disorder, history of noncompliance with medications presenting to the emergency department with chief complaint of suicidal ideation.  The patient initially presented intoxicated to the emergency department with an alcohol level of 297.  He states that he has been having suicidal thoughts for the past 3 days.  This is in the context of the discovery that his mother was having sexual relations with his best friend.  He states that he has had thoughts of driving his car erratically and getting into an accident.  He is also has a history of cutting behaviors.  He denies any specific plan at this time but continues to endorse SI and requests psychiatric consultation to obtain help.  He presents voluntarily.  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  He was last admitted to behavioral health in May 2023.  He currently denies any HI or AVH.   Face-to-face observation of patient, patient is alert and oriented x4, speech is clear, maintained minimal eye contact.  Mood is anxious, affect congruent with mood.  Patient states endorsed suicidal ideation but with no immediate plans, patient does have a history of SI in the past and [name] in the past.  Denies HI,  AVH, or paranoia.  Patient denies illicit drug use, reports he smokes cigarettes only.  Recommend inpatient observation  PHQ 2-9:  Flowsheet Row ED from 06/11/2022 in [facility] EMERGENCY DEPARTMENT ED from 05/29/2022 in [facility] Office Visit from 04/12/2021 in [facility]  Thoughts that you would be better off dead, or of hurting yourself in some way Several days Several days Not at all  PHQ-9 Total Score 17 11 21        Flowsheet Row ED from 06/12/2022 in [facility] ED from 06/11/2022 in [facility] EMERGENCY DEPARTMENT ED from 05/29/2022 in [facility]  C-SSRS RISK CATEGORY High Risk No Risk High Risk        Total Time spent with patient: 20 minutes  Musculoskeletal  Strength & Muscle Tone: within normal limits Gait & Station: normal Patient leans: N/A  Psychiatric Specialty Exam  Presentation General Appearance: Casual  Eye Contact:Fair  Speech:Clear and Coherent  Speech Volume:Normal  Handedness:Right   Mood and Affect  Mood:Anxious  Affect:Appropriate   Thought Process  Thought Processes:Coherent  Descriptions of Associations:Circumstantial  Orientation:Full (Time, Place and Person)  Thought Content:Logical  Diagnosis of Schizophrenia or Schizoaffective disorder in past: No   Hallucinations:No data recorded Ideas of Reference:None  Suicidal Thoughts:Suicidal Thoughts: Yes, Active SI Active Intent and/or Plan: Without Plan  Homicidal Thoughts:Homicidal Thoughts: No   Sensorium  Memory:Immediate Fair  Judgment:Poor  Insight:Poor   [facility]  Concentration:Poor  Attention Span:Fair  Recall:Fair  Fund of Knowledge:Fair  Language:Fair   Psychomotor Activity  Psychomotor Activity:Psychomotor Activity: Normal   Assets  Assets:Desire for Improvement   Sleep  Sleep:Sleep:  Fair   Nutritional Assessment (For OBS and FBC admissions only) Has the patient had a weight loss or gain of 10 pounds or more in the last 3 months?: No Has the patient had a decrease in food intake/or appetite?: No Does the patient have dental problems?: No Does the patient have eating habits or behaviors that may be indicators of an eating disorder including binging or inducing vomiting?: No Has the patient recently lost weight without trying?: 0    Physical Exam HENT:     Head: Normocephalic.     Nose: Nose normal.  Cardiovascular:     Rate and Rhythm: Normal rate.  Pulmonary:     Effort: Pulmonary effort is normal.  Musculoskeletal:        General: Normal range of motion.     Cervical back: Normal range of motion.  Skin:    General: Skin is warm.  Neurological:     General: No focal deficit present.     Mental Status: He is alert.  Psychiatric:        Mood and Affect: Mood normal.        Behavior: Behavior normal.        Thought Content: Thought content normal.        Judgment: Judgment normal.    Review of Systems  Constitutional: Negative.   HENT: Negative.    Eyes: Negative.   Respiratory: Negative.    Cardiovascular: Negative.   Gastrointestinal: Negative.   Genitourinary: Negative.   Musculoskeletal: Negative.   Skin: Negative.   Neurological: Negative.   Endo/Heme/Allergies: Negative.   Psychiatric/Behavioral:  Positive for depression and suicidal ideas.     Blood pressure 121/88, pulse 77, temperature 98.5 F (36.9 C), temperature source Oral, resp. rate 18, SpO2 100 %. There is no height or weight on file to calculate BMI.  Past Psychiatric History: PTSD, GAD, alcohol abuse, MDD, SI.  Is the patient at risk to self? Yes  Has the patient been a risk to self in the past 6 months? Yes .    Has the patient been a risk to self within the distant past? Yes   Is the patient a risk to others? No   Has the patient been a risk to others in the past 6  months? No   Has the patient been a risk to others within the distant past? No   Past Medical History:  Past Medical History:  Diagnosis Date   Alcohol abuse    Anxiety    Bipolar 2 disorder (HCC)    Depression     Past Surgical History:  Procedure Laterality Date   HAND SURGERY Right     Family History:  Family History  Problem Relation Age of Onset   Anxiety disorder Mother    Alcohol abuse Father    Anxiety disorder Maternal Aunt    Anxiety disorder Maternal Grandmother    Alcohol abuse Paternal Grandfather     Social History:  Social History   Socioeconomic History   Marital status: Single    Spouse name: Not on file   Number of children: Not on file   Years of education: Not on file   Highest education level: Not on file  Occupational History   Not on file  Tobacco Use   Smoking  status: Every Day    Packs/day: 1.50    Types: Cigarettes   Smokeless tobacco: Former    Types: Snuff  Vaping Use   Vaping Use: Never used  Substance and Sexual Activity   Alcohol use: Yes    Alcohol/week: 2.0 standard drinks of alcohol    Types: 2 Cans of beer per week    Comment: 48 beers a week   Drug use: Not Currently    Types: Marijuana, Cocaine    Comment: cocaine in last month   Sexual activity: Not Currently  Other Topics Concern   Not on file  Social History Narrative   Not on file   Social Determinants of Health   Financial Resource Strain: Not on file  Food Insecurity: Not on file  Transportation Needs: Not on file  Physical Activity: Not on file  Stress: Not on file  Social Connections: Not on file  Intimate Partner Violence: Not on file    SDOH:  SDOH Screenings   Alcohol Screen: Medium Risk (05/13/2022)   Alcohol Screen    Last Alcohol Screening Score (AUDIT): 20  Depression (PHQ2-9): Medium Risk (06/11/2022)   Depression (PHQ2-9)    PHQ-2 Score: 17  Financial Resource Strain: Not on file  Food Insecurity: Not on file  Housing: Not on file   Physical Activity: Not on file  Social Connections: Not on file  Stress: Not on file  Tobacco Use: High Risk (06/11/2022)   Patient History    Smoking Tobacco Use: Every Day    Smokeless Tobacco Use: Former    Passive Exposure: Not on file  Transportation Needs: Not on file    Last Labs:  Admission on 06/11/2022, Discharged on 06/12/2022  Component Date Value Ref Range Status   Sodium 06/11/2022 139  135 - 145 mmol/L Final   Potassium 06/11/2022 4.0  3.5 - 5.1 mmol/L Final   Chloride 06/11/2022 108  98 - 111 mmol/L Final   CO2 06/11/2022 20 (L)  22 - 32 mmol/L Final   Glucose, Bld 06/11/2022 102 (H)  70 - 99 mg/dL Final   Glucose reference range applies only to samples taken after fasting for at least 8 hours.   BUN 06/11/2022 13  6 - 20 mg/dL Final   Creatinine, Ser 06/11/2022 1.32 (H)  0.61 - 1.24 mg/dL Final   Calcium 16/09/9603 9.0  8.9 - 10.3 mg/dL Final   Total Protein 54/08/8118 7.4  6.5 - 8.1 g/dL Final   Albumin 14/78/2956 4.2  3.5 - 5.0 g/dL Final   AST 21/30/8657 25  15 - 41 U/L Final   ALT 06/11/2022 19  0 - 44 U/L Final   Alkaline Phosphatase 06/11/2022 56  38 - 126 U/L Final   Total Bilirubin 06/11/2022 0.6  0.3 - 1.2 mg/dL Final   GFR, Estimated 06/11/2022 >60  >60 mL/min Final   Comment: (NOTE) Calculated using the CKD-EPI Creatinine Equation (2021)    Anion gap 06/11/2022 11  5 - 15 Final   Performed at [facility], [address] [place], [place] [address]   Alcohol, Ethyl (B) 06/11/2022 297 (H)  <10 mg/dL Final   Comment: (NOTE) Lowest detectable limit for serum alcohol is 10 mg/dL.  For medical purposes only. Performed at [facility], [address], [place], [place] [address]    Salicylate Lvl 06/11/2022 <7.0 (L)  7.0 - 30.0 mg/dL Final   Performed at [facility], [address], [place], [place] [address]  Acetaminophen (Tylenol), Serum 06/11/2022 <10 (L)  10 - 30 ug/mL Final   Comment: (NOTE) Therapeutic concentrations vary  significantly. A range of 10-30 ug/mL  may be an effective concentration for many patients. However, some  are best treated at concentrations outside of this range. Acetaminophen concentrations >150 ug/mL at 4 hours after ingestion  and >50 ug/mL at 12 hours after ingestion are often associated with  toxic reactions.  Performed at [facility], [address], [place], [place] [address]    WBC 06/11/2022 4.9  4.0 - 10.5 K/uL Final   RBC 06/11/2022 4.50  4.22 - 5.81 MIL/uL Final   Hemoglobin 06/11/2022 15.2  13.0 - 17.0 g/dL Final   HCT 52/84/1324 42.8  39.0 - 52.0 % Final   MCV 06/11/2022 95.1  80.0 - 100.0 fL Final   MCH 06/11/2022 33.8  26.0 - 34.0 pg Final   MCHC 06/11/2022 35.5  30.0 - 36.0 g/dL Final   RDW 40/09/2724 11.9  11.5 - 15.5 % Final   Platelets 06/11/2022 265  150 - 400 K/uL Final   nRBC 06/11/2022 0.0  0.0 - 0.2 % Final   Performed at [facility], [address], [place], [place] [address]   Opiates 06/11/2022 NONE DETECTED  NONE DETECTED Final   Cocaine 06/11/2022 POSITIVE (A)  NONE DETECTED Final   Benzodiazepines 06/11/2022 NONE DETECTED  NONE DETECTED Final   Amphetamines 06/11/2022 NONE DETECTED  NONE DETECTED Final   Tetrahydrocannabinol 06/11/2022 NONE DETECTED  NONE DETECTED Final   Barbiturates 06/11/2022 NONE DETECTED  NONE DETECTED Final   Comment: (NOTE) DRUG SCREEN FOR MEDICAL PURPOSES ONLY.  IF CONFIRMATION IS NEEDED FOR ANY PURPOSE, NOTIFY LAB WITHIN 5 DAYS.  LOWEST DETECTABLE LIMITS FOR URINE DRUG SCREEN Drug Class                     Cutoff (ng/mL) Amphetamine and metabolites    1000 Barbiturate and metabolites    200 Benzodiazepine                 200 Tricyclics and metabolites     300 Opiates and metabolites        300 Cocaine and metabolites        300 THC                            50 Performed at [facility], [address], [place], [place] [address]    SARS Coronavirus 2 by RT PCR 06/11/2022 NEGATIVE  NEGATIVE  Final   Comment: (NOTE) SARS-CoV-2 target nucleic acids are NOT DETECTED.  The SARS-CoV-2 RNA is generally detectable in upper and lower respiratory specimens during the acute phase of infection. The lowest concentration of SARS-CoV-2 viral copies this assay can detect is 250 copies / mL. A negative result does not preclude SARS-CoV-2 infection and should not be used as the sole basis for treatment or other patient management decisions.  A negative result may occur with improper specimen collection / handling, submission of specimen other than nasopharyngeal swab, presence of viral mutation(s) within the areas targeted by this assay, and inadequate number of viral copies (<250 copies / mL). A negative result must be combined with clinical observations, patient history, and epidemiological information.  Fact Sheet for Patients:   RoadLapTop.co.za  Fact Sheet for Healthcare Providers: http://kim-miller.com/  This test is not yet approved or  cleared by the [place] and has been authorized for detection and/or diagnosis of SARS-CoV-2 by FDA under an Emergency Use Authorization (EUA).  This EUA will remain in effect (meaning this test can be used) for the duration of the COVID-19 declaration under Section 564(b)(1) of the Act, 21 U.S.C. section 360bbb-3(b)(1), unless the authorization is terminated or revoked sooner.  Performed at [facility], [address], [place], [place] [address]   Admission on 05/13/2022, Discharged on 05/18/2022  Component Date Value Ref Range Status   Color, Urine 05/13/2022 YELLOW  YELLOW Final   APPearance 05/13/2022 CLEAR  CLEAR Final   Specific Gravity, Urine 05/13/2022 1.015  1.005 - 1.030 Final   pH 05/13/2022 6.0  5.0 - 8.0 Final   Glucose, UA 05/13/2022 NEGATIVE  NEGATIVE mg/dL Final   Hgb urine dipstick 05/13/2022 NEGATIVE  NEGATIVE Final   Bilirubin Urine  05/13/2022 NEGATIVE  NEGATIVE Final   Ketones, ur 05/13/2022 NEGATIVE  NEGATIVE mg/dL Final   Protein, ur 60/45/4098 NEGATIVE  NEGATIVE mg/dL Final   Nitrite 11/91/4782 NEGATIVE  NEGATIVE Final   Leukocytes,Ua 05/13/2022 NEGATIVE  NEGATIVE Final   Performed at [facility], [address] [place], [place] [address]   Cholesterol 05/14/2022 196  0 - 200 mg/dL Final   Triglycerides 30/86/5784 93  <150 mg/dL Final   HDL 69/62/9528 57  >40 mg/dL Final   Total CHOL/HDL Ratio 05/14/2022 3.4  RATIO Final   VLDL 05/14/2022 19  0 - 40 mg/dL Final   LDL Cholesterol 05/14/2022 120 (H)  0 - 99 mg/dL Final   Comment:        Total Cholesterol/HDL:CHD Risk Coronary Heart Disease Risk Table                     Men   Women  1/2 Average Risk   3.4   3.3  Average Risk       5.0   4.4  2 X Average Risk   9.6   7.1  3 X Average Risk  23.4   11.0        Use the calculated Patient Ratio above and the CHD Risk Table to determine the patient's CHD Risk.        ATP III CLASSIFICATION (LDL):  <100     mg/dL   Optimal  413-244  mg/dL   Near or Above                    Optimal  130-159  mg/dL   Borderline  010-272  mg/dL   High  >536     mg/dL   Very High Performed at [facility], [address], [place], [place] [address]    TSH 05/14/2022 2.724  0.350 - 4.500 uIU/mL Final   Comment: Performed by a 3rd Generation assay with a functional sensitivity of <=0.01 uIU/mL. Performed at [facility], [address] [place], [place] [address]    Hgb A1c MFr Bld 05/14/2022 5.4  4.8 - 5.6 % Final   Comment: (NOTE) Pre diabetes:          5.7%-6.4%  Diabetes:              >6.4%  Glycemic control for   <7.0% adults with diabetes    Mean Plasma Glucose 05/14/2022 108.28  mg/dL Final   Performed at [facility], [address], [place], [place] [address]   Carbamazepine, Free  05/18/2022 NOT PERFORMED   Final   Comment: (NOTE) Test not  performed Performed At: [facility] [address] [place], [place] [id number] Jolene SchimkeNagendra Sanjai MD WU:[id number]:[phone]    Carbamazepine, Total 05/18/2022 QNSTST  ug/mL Final   Comment: (NOTE) Test not performed. Insufficient specimen to perform or complete analysis.         In conjunction with other antiepileptic drugs                               Therapeutic  4.0 -  8.0                               Toxicity     9.0 - 12.0                                   Carbamazepine alone                               Therapeutic  8.0 - 12.0                                Detection Limit =  0.5                          <0.5 indicated None Detected    Sodium 05/18/2022 140  135 - 145 mmol/L Final   Potassium 05/18/2022 4.0  3.5 - 5.1 mmol/L Final   Chloride 05/18/2022 106  98 - 111 mmol/L Final   CO2 05/18/2022 29  22 - 32 mmol/L Final   Glucose, Bld 05/18/2022 106 (H)  70 - 99 mg/dL Final   Glucose reference range applies only to samples taken after fasting for at least 8 hours.   BUN 05/18/2022 13  6 - 20 mg/dL Final   Creatinine, Ser 05/18/2022 0.91  0.61 - 1.24 mg/dL Final   Calcium 95/62/130805/26/2023 9.5  8.9 - 10.3 mg/dL Final   GFR, Estimated 05/18/2022 >60  >60 mL/min Final   Comment: (NOTE) Calculated using the CKD-EPI Creatinine Equation (2021)    Anion gap 05/18/2022 5  5 - 15 Final   Performed at [facility], [address] [place], [place] [address]  Admission on 05/10/2022, Discharged on 05/13/2022  Component Date Value Ref Range Status   Sodium 05/10/2022 143  135 - 145 mmol/L Final   Potassium 05/10/2022 3.8  3.5 - 5.1 mmol/L Final   Chloride 05/10/2022 114 (H)  98 - 111 mmol/L Final   CO2 05/10/2022 21 (L)  22 - 32 mmol/L Final   Glucose, Bld 05/10/2022 100 (H)  70 - 99 mg/dL Final   Glucose reference range applies only to samples taken after fasting for at least 8 hours.   BUN 05/10/2022 8  6 - 20 mg/dL Final   Creatinine, Ser 05/10/2022 0.88  0.61 - 1.24  mg/dL Final   Calcium 69/62/952805/18/2023 8.9  8.9 - 10.3 mg/dL Final   Total Protein 41/32/440105/18/2023 8.5 (H)  6.5 - 8.1 g/dL Final   Albumin 02/72/536605/18/2023 4.9  3.5 - 5.0 g/dL Final   AST 44/03/474205/18/2023 17  15 - 41 U/L Final   ALT 05/10/2022 18  0 - 44 U/L Final   Alkaline Phosphatase 05/10/2022 51  38 - 126 U/L Final   Total Bilirubin 05/10/2022 0.6  0.3 - 1.2 mg/dL Final   GFR, Estimated 05/10/2022 >60  >60 mL/min Final   Comment: (NOTE) Calculated using the CKD-EPI Creatinine Equation (2021)    Anion gap 05/10/2022 8  5 - 15 Final   Performed at [facility], [address] [place], [place] [address]   Alcohol, Ethyl (B) 05/10/2022 377 (HH)  <10 mg/dL Final   Comment: CRITICAL RESULT CALLED TO, READ BACK BY AND VERIFIED WITH: BLAIR,I. RN AT 1115 05/10/22 MULLINS,T (NOTE) Lowest detectable limit for serum alcohol is 10 mg/dL.  For medical purposes only. Performed at [facility], [address], [place], [place] [address]    Opiates 05/10/2022 NONE DETECTED  NONE DETECTED Final   Cocaine 05/10/2022 POSITIVE (A)  NONE DETECTED Final   Benzodiazepines 05/10/2022 NONE DETECTED  NONE DETECTED Final   Amphetamines 05/10/2022 NONE DETECTED  NONE DETECTED Final   Tetrahydrocannabinol 05/10/2022 NONE DETECTED  NONE DETECTED Final   Barbiturates 05/10/2022 NONE DETECTED  NONE DETECTED Final   Comment: (NOTE) DRUG SCREEN FOR MEDICAL PURPOSES ONLY.  IF CONFIRMATION IS NEEDED FOR ANY PURPOSE, NOTIFY LAB WITHIN 5 DAYS.  LOWEST DETECTABLE LIMITS FOR URINE DRUG SCREEN Drug Class                     Cutoff (ng/mL) Amphetamine and metabolites    1000 Barbiturate and metabolites    200 Benzodiazepine                 200 Tricyclics and metabolites     300 Opiates and metabolites        300 Cocaine and metabolites        300 THC                            50 Performed at [facility], [address], [place], [place] [address]    WBC 05/10/2022 4.9   4.0 - 10.5 K/uL Final   RBC 05/10/2022 4.78  4.22 - 5.81 MIL/uL Final   Hemoglobin 05/10/2022 15.9  13.0 - 17.0 g/dL Final   HCT 91/47/8295 46.1  39.0 - 52.0 % Final   MCV 05/10/2022 96.4  80.0 - 100.0 fL Final   MCH 05/10/2022 33.3  26.0 - 34.0 pg Final   MCHC 05/10/2022 34.5  30.0 - 36.0 g/dL Final   RDW 62/13/0865 12.1  11.5 - 15.5 % Final   Platelets 05/10/2022 241  150 - 400 K/uL Final   nRBC 05/10/2022 0.0  0.0 - 0.2 % Final   Neutrophils Relative % 05/10/2022 32  % Final   Neutro Abs 05/10/2022 1.6 (L)  1.7 - 7.7 K/uL Final   Lymphocytes Relative 05/10/2022 50  % Final   Lymphs Abs 05/10/2022 2.5  0.7 - 4.0 K/uL Final   Monocytes Relative 05/10/2022 14  % Final   Monocytes Absolute 05/10/2022 0.7  0.1 - 1.0 K/uL Final   Eosinophils Relative 05/10/2022 2  % Final   Eosinophils Absolute 05/10/2022 0.1  0.0 - 0.5 K/uL Final   Basophils Relative 05/10/2022 1  % Final   Basophils Absolute 05/10/2022 0.0  0.0 - 0.1 K/uL Final   Immature Granulocytes 05/10/2022 1  % Final   Abs Immature Granulocytes 05/10/2022 0.03  0.00 - 0.07 K/uL Final   Performed  at [facility], [address], [place], [place] [address]   Carbamazepine Lvl 05/10/2022 <2.0 (L)  4.0 - 12.0 ug/mL Final   Performed at [facility], [address], [place], [place] [address]   SARS Coronavirus 2 by RT PCR 05/12/2022 NEGATIVE  NEGATIVE Final   Comment: (NOTE) SARS-CoV-2 target nucleic acids are NOT DETECTED.  The SARS-CoV-2 RNA is generally detectable in upper respiratory specimens during the acute phase of infection. The lowest concentration of SARS-CoV-2 viral copies this assay can detect is 138 copies/mL. A negative result does not preclude SARS-Cov-2 infection and should not be used as the sole basis for treatment or other patient management decisions. A negative result may occur with  improper specimen collection/handling, submission of specimen other than nasopharyngeal swab,  presence of viral mutation(s) within the areas targeted by this assay, and inadequate number of viral copies(<138 copies/mL). A negative result must be combined with clinical observations, patient history, and epidemiological information. The expected result is Negative.  Fact Sheet for Patients:  BloggerCourse.com  Fact Sheet for Healthcare Providers:  SeriousBroker.it  This test is no                          t yet approved or cleared by the [place] FDA and  has been authorized for detection and/or diagnosis of SARS-CoV-2 by FDA under an Emergency Use Authorization (EUA). This EUA will remain  in effect (meaning this test can be used) for the duration of the COVID-19 declaration under Section 564(b)(1) of the Act, 21 U.S.C.section 360bbb-3(b)(1), unless the authorization is terminated  or revoked sooner.       Influenza A by PCR 05/12/2022 NEGATIVE  NEGATIVE Final   Influenza B by PCR 05/12/2022 NEGATIVE  NEGATIVE Final   Comment: (NOTE) The Xpert Xpress SARS-CoV-2/FLU/RSV plus assay is intended as an aid in the diagnosis of influenza from Nasopharyngeal swab specimens and should not be used as a sole basis for treatment. Nasal washings and aspirates are unacceptable for Xpert Xpress SARS-CoV-2/FLU/RSV testing.  Fact Sheet for Patients: BloggerCourse.com  Fact Sheet for Healthcare Providers: SeriousBroker.it  This test is not yet approved or cleared by the [place] FDA and has been authorized for detection and/or diagnosis of SARS-CoV-2 by FDA under an Emergency Use Authorization (EUA). This EUA will remain in effect (meaning this test can be used) for the duration of the COVID-19 declaration under Section 564(b)(1) of the Act, 21 U.S.C. section 360bbb-3(b)(1), unless the authorization is terminated or revoked.  Performed at [facility], [address] [place], [place] [address]   Admission on 05/01/2022, Discharged on 05/01/2022  Component Date Value Ref Range Status   Sodium 05/01/2022 140  135 - 145 mmol/L Final   Potassium 05/01/2022 4.0  3.5 - 5.1 mmol/L Final   Chloride 05/01/2022 108  98 - 111 mmol/L Final   CO2 05/01/2022 19 (L)  22 - 32 mmol/L Final   Glucose, Bld 05/01/2022 103 (H)  70 - 99 mg/dL Final   Glucose reference range applies only to samples taken after fasting for at least 8 hours.   BUN 05/01/2022 10  6 - 20 mg/dL Final   Creatinine, Ser 05/01/2022 0.71  0.61 - 1.24 mg/dL Final   Calcium 29/52/8413 8.9  8.9 - 10.3 mg/dL Final   GFR, Estimated 05/01/2022 >60  >60 mL/min Final   Comment: (NOTE) Calculated using the CKD-EPI Creatinine Equation (2021)  Anion gap 05/01/2022 13  5 - 15 Final   Performed at [facility], [address], [place], [place] [address]   WBC 05/01/2022 6.8  4.0 - 10.5 K/uL Final   RBC 05/01/2022 4.75  4.22 - 5.81 MIL/uL Final   Hemoglobin 05/01/2022 15.7  13.0 - 17.0 g/dL Final   HCT 62/13/0865 46.1  39.0 - 52.0 % Final   MCV 05/01/2022 97.1  80.0 - 100.0 fL Final   MCH 05/01/2022 33.1  26.0 - 34.0 pg Final   MCHC 05/01/2022 34.1  30.0 - 36.0 g/dL Final   RDW 78/46/9629 12.7  11.5 - 15.5 % Final   Platelets 05/01/2022 336  150 - 400 K/uL Final   nRBC 05/01/2022 0.0  0.0 - 0.2 % Final   Performed at [facility], [address] [place], [place] [address]   Troponin I (High Sensitivity) 05/01/2022 <2  <18 ng/L Final   Comment: (NOTE) Elevated high sensitivity troponin I (hsTnI) values and significant  changes across serial measurements may suggest ACS but many other  chronic and acute conditions are known to elevate hsTnI results.  Refer to the "Links" section for chest pain algorithms and additional  guidance. Performed at [facility], [address] [place], [place] [address]    Opiates 05/01/2022 NONE  DETECTED  NONE DETECTED Final   Cocaine 05/01/2022 POSITIVE (A)  NONE DETECTED Final   Benzodiazepines 05/01/2022 NONE DETECTED  NONE DETECTED Final   Amphetamines 05/01/2022 NONE DETECTED  NONE DETECTED Final   Tetrahydrocannabinol 05/01/2022 NONE DETECTED  NONE DETECTED Final   Barbiturates 05/01/2022 NONE DETECTED  NONE DETECTED Final   Comment: (NOTE) DRUG SCREEN FOR MEDICAL PURPOSES ONLY.  IF CONFIRMATION IS NEEDED FOR ANY PURPOSE, NOTIFY LAB WITHIN 5 DAYS.  LOWEST DETECTABLE LIMITS FOR URINE DRUG SCREEN Drug Class                     Cutoff (ng/mL) Amphetamine and metabolites    1000 Barbiturate and metabolites    200 Benzodiazepine                 200 Tricyclics and metabolites     300 Opiates and metabolites        300 Cocaine and metabolites        300 THC                            50 Performed at [facility], [address] [place], [place] [address]    Troponin I (High Sensitivity) 05/01/2022 <2  <18 ng/L Final   Comment: (NOTE) Elevated high sensitivity troponin I (hsTnI) values and significant  changes across serial measurements may suggest ACS but many other  chronic and acute conditions are known to elevate hsTnI results.  Refer to the "Links" section for chest pain algorithms and additional  guidance. Performed at [facility], [address], [place], [place] [address]     Allergies: Patient has no known allergies.  PTA Medications: (Not in a hospital admission)   Medical Decision Making  Inpatient observation  Scheduled Meds:  LORazepam  1 mg Oral QID   Followed by   Melene Muller ON 06/13/2022] LORazepam  1 mg Oral TID   Followed by   Melene Muller ON 06/14/2022] LORazepam  1 mg Oral BID   Followed by   Melene Muller ON 06/16/2022] LORazepam  1 mg Oral Daily  multivitamin with minerals  1 tablet Oral Daily   [START ON 06/13/2022] thiamine  100 mg Oral Daily   Continuous Infusions: PRN Meds:.acetaminophen, alum & mag  hydroxide-simeth, hydrOXYzine, loperamide, LORazepam, magnesium hydroxide, ondansetron     Recommendations  Based on my evaluation the patient does not appear to have an emergency medical condition.  Sindy Guadeloupe, NP 06/12/22  5:42 AM

## 2022-06-12 NOTE — ED Notes (Signed)
A&O x 4, pending report & transfer to [facility].

## 2022-06-12 NOTE — BH Assessment (Signed)
LCSW Progress Note  Per Princess Bruins, DO, this pt requires psychiatric hospitalization at this time and will be transferred to [facility].  Discharge instructions include several resources for outpatient med management, therapy, and substance use treatment, emergency shelters, rehab facilities, AA meetings and Mobile Crisis.  Instructions also include a reminder to inquire about PHP at the agencies provided, particularly [facility].  EDP Princess Bruins, DO, has been notified.  Hansel Starling, MSW, LCSW [facility] [phone] or [phone]

## 2022-06-12 NOTE — ED Notes (Signed)
Pt is transfer from [facility], presents with SI, no plan noted.   Pt calm & cooperative.  Monitoring for safety.

## 2022-06-12 NOTE — ED Notes (Signed)
Here for active suicidal ideation in the setting of learning that his mom is having sexual relations with his best friend.  Plan to stab/cut his arm.  Secondary plan to crash his car to make it look like an accident. Restarted home meds: Tegretol, Cymbalta, gabapentin Dispo: [facility], voluntary

## 2022-06-12 NOTE — Discharge Instructions (Addendum)
To see which pharmacy near you is the CHEAPEST for certain medications, please use GoodRx. It is free website and has a free phone app.       For issues with sleep, please use this free app for insomnia called CBT-I. Let your doctors and therapist know so they can help with extra tips and tricks or for guidance and accountability. NO ADDS on the app.     For therapy outside the hospital, please ask for these specific types of therapy: CBT, alcohol anonymous    Please make regular appointments with an outpatient psychiatrist and other doctors once you leave the hospital.    Suicide hotline: 988 Emergency: 911  Based on observation, you will be discharged and provided the following resources based on your need. It is important that you follow-through with treatment recommendations provided within 7-10 business days to prevent relapse in symptoms and behaviors. The following providers are recommended but you are not limited to what is provided here. In case of an urgent emergency, you have the option of contacting the Mobile Crisis Unit with [facility] at [phone].  Medication Management and Therapy Services [facility] is upstairs on the 2nd floor. [address], [place], [address] [phone] phone Walk-ins only For Therapy Mon-Weds 8am until the slots are full Fri - 1pm-5pm For Mon-Weds walk-ins, we ask that you arrive by 7:30am-7:45am because patients will be seen in the order of their arrival. For Friday, we ask that you come in at 12pm-12:3pm. Availability is limited, therefore, you may not be seen on the same day. For Medication Management Mon-Fri 8am-11am Please be here by 7:30am-7:45am as patients will be seen in the order of arrival.  [facility] also offers Partial Hospitalization Program which is an intensive outpatient program that lasts for a few weeks.  This is a viable option  for you given the severity and frequency of your mental health symptoms.  [facility]. [address] [place], [place], [address] [phone] phone [phone] fax  [facility] [address] [place], [place] [address] [phone]  [facility]  (Can also do substance use treatment) [address] [place], [address] [phone] phone  [facility]  (Can also do substance use treatment) [address] [place], [place], [address] [phone] phone  CHARITABLE RESIDENTIAL REHABS  [facility] (men only at this campus) [address] [place], [place] [address] [phone]  ((These programs listed above have a one-time application fee.))   [address] [place] [address] [phone] [address] [place] [address] [place], [place] [address] [phone], [place] [address] [phone]  [facility] (Rehab for men only) [address], [place], [place] [address] [phone]   MEDICAL DETOX/RESIDENTIAL TREATMENT- MEDICAID/IPRS:  [facility] (Should be re-opening soon) [address] [place], [place] [address] [phone]  [facility] - [facility] [address] [place], [place] [address] [phone]  ((For admissions to these three [facility] facilities during weekday days and possibly other times, contact [place], phone: [phone]; fax: [phone]))  [facility] (detox for men and women) [address] [place], [place] [address] [phone]  OUTPATIENT PROGRAMS:  [facility] ([facility]) [address], [place] [address] [phone]  ((CD-IOP is currently not operational; Opioid replacement clinic is operational))  [facility] (Medicaid & IPRS for [facility] residents) [address] [place], [place] [address] [phone]  ((CD-IOP; new clients must go through walk-in clinic; NOT CURRENTLY OPERATIONAL))   [facility] [employer] (Medicaid for [employer] and [employer]; some private insurance) [address] [place], [place] [address] [phone]  ((CD-IOP))    HALFWAY HOUSES:  [facility] [employer] [phone]  [employer].[email or web address]   12 STEP PROGRAMS:  Alcoholics Anonymous of [place] [employer]  Narcotics Anonymous of [place] [employer]  Al-Anon of [place], [place] www.[place]alanon.org/find-meetings.html  Nar-Anon https://nar-anon.org/find-a-meeting  Emergency Shelters  [facility] [address] [place], [place] [address] [phone] Population served: Adult men & women (8 years old and older, able to perform activities for daily living) Documents required: Valid ID & Social Security Card  [facility] [address], [place], [place] [address] [phone] Population served: Males 18+ Documents required: Valid ID & Social Security Card  [facility] [address], [place], [place] [address] [phone] Population served: Single adults and families with children Documents required: Valid ID & another form of identification  [facility] [employer] [address], [place], [place] [address] [phone] Population Served: Families with children, adult women, and adult men.  [facility] [address], [place], [place] [address] [phone] Population served: Men 18+, preference for disabled and/or veterans Eligibility: By referral only  LGBTQ Resources  [facility] [address], [place], [address] [phone] phone [email or web address]  https://www.allarewelcomecounseling.com/  [facility] does a group meeting for the LGBTQ+ community  only.  It is call Over the Rainbow and is led by Lezlie Octave every Monday through Zoom @ 3:30p-4:30p.  Go to www.Zoom.[place] or download the Zoom app.  Click on "Join a Meeting" and enter the Zoom call number - [phone].  Then wait for the facilitator to let you join the group.

## 2022-06-12 NOTE — ED Provider Notes (Signed)
FBC/OBS ASAP Discharge Summary  Date and Time: 06/12/2022 5:15 PM  Name: John Vaughan  MRN:  [id number]   Discharge Diagnoses:  Final diagnoses:  Suicidal ideation  Alcohol abuse with intoxication (HCC)  Behavior concern in adult  MDD (major depressive disorder), recurrent severe, without psychosis (HCC)  Alcohol use disorder, severe, dependence (HCC)  GAD (generalized anxiety disorder)    Stay Summary:  John Vaughan is a 31 y.o. male with PMH alcohol abuse with withdrawal seizures, cocaine abuse, GAD, SAD, NSSIB, multiple psych hospitalization presented to [facility] (06/12/2022) voluntarily for active suicidal ideation, in the setting of distressing news about his family.  Of note patient was at [facility] on 05/29/2022 for similar complaint.  In the ED for AKI with Cr 1.32 (baseline Cr 0.8-0.9), patient received IVF, for which is HAI resolved on repeat metabolic panel.  Patient also came in with a BAL of 297.  UDS positive for cocaine.  Salicylate level negative.   At [facility] he was restarted on home medications, Tegretol, gabapentin, Cymbalta.   Given h/o AUD and sxs of peripheral neuropathy in hands and feet, is concerning for vit b12 and/or folate deficiency. B12 serum  level, mma serum level and folate serum level ordered to rule out deficiency as cause of depression and neuropathy in sock and glove distribution.  We will consider supplementing folate, B12. Continues on supplementation thiamine.  Patient is on Tegretol for mood and h/o seizure d/o. Given patient's depressed mood, consider cross-taper to Lamictal since patient endorses med compliance.  Can also consider Trileptal or Keppra for history of seizure.  Subjective:  Patient was seen today resting comfortably.  Reported that he is still suicidal, with thoughts to stab himself in the arm with a knife.  He has also thought of crashing his car and making it look like an accident.  Stated that he wants to die quickly, yet the  pain at the same time.  Stated that before he came in, that he was planning on stabbing his arm with a knife, but his family stopped him.  He stated that he learned something Saturday night, that prompted the suicidal thoughts.  Patient did not disclose, however on initial note during this encounter, it is stated that he learned that his mother was in a sexual relationship with his best friend.  Patient stated that he did not sleep well last night, given that he came in late.  Reported that his appetite is low, although he does still try to eat.  Reported his mood is depressed and hopeless.  He continued to endorse active SI today, but the same plans to either stab himself or get into a car crash.  He denied HI/AVH.  Denied delusions and paranoia.  Patient did not appear internally preoccupied.  Patient stated that he does have tingling nerve pain in his hands and feet, that the Cymbalta helps with.  He reported that he is med adherent.  Discussed inpatient psych admission, stated that he was amenable.  He will be admitted voluntarily.  After his hospitalization, he plans to return to his parents house to grab as above, then stay with his brother.  Total Time spent with patient: 45 minutes  Past Psychiatric History: See H&P Past Medical History:  Past Medical History:  Diagnosis Date   Alcohol abuse    Anxiety    Bipolar 2 disorder (HCC)    Depression     Past Surgical History:  Procedure Laterality Date   HAND SURGERY Right  Family History:  Family History  Problem Relation Age of Onset   Anxiety disorder Mother    Alcohol abuse Father    Anxiety disorder Maternal Aunt    Anxiety disorder Maternal Grandmother    Alcohol abuse Paternal Grandfather    Family Psychiatric History: See H&P Social History:  Social History   Substance and Sexual Activity  Alcohol Use Yes   Alcohol/week: 2.0 standard drinks of alcohol   Types: 2 Cans of beer per week   Comment: 48 beers a week      Social History   Substance and Sexual Activity  Drug Use Not Currently   Types: Marijuana, Cocaine   Comment: cocaine in last month    Social History   Socioeconomic History   Marital status: Single    Spouse name: Not on file   Number of children: Not on file   Years of education: Not on file   Highest education level: Not on file  Occupational History   Not on file  Tobacco Use   Smoking status: Every Day    Packs/day: 1.50    Types: Cigarettes   Smokeless tobacco: Former    Types: Snuff  Vaping Use   Vaping Use: Never used  Substance and Sexual Activity   Alcohol use: Yes    Alcohol/week: 2.0 standard drinks of alcohol    Types: 2 Cans of beer per week    Comment: 48 beers a week   Drug use: Not Currently    Types: Marijuana, Cocaine    Comment: cocaine in last month   Sexual activity: Not Currently  Other Topics Concern   Not on file  Social History Narrative   Not on file   Social Determinants of Health   Financial Resource Strain: Not on file  Food Insecurity: Not on file  Transportation Needs: Not on file  Physical Activity: Not on file  Stress: Not on file  Social Connections: Not on file   SDOH:  SDOH Screenings   Alcohol Screen: Medium Risk (05/13/2022)   Alcohol Screen    Last Alcohol Screening Score (AUDIT): 20  Depression (PHQ2-9): Medium Risk (06/11/2022)   Depression (PHQ2-9)    PHQ-2 Score: 17  Financial Resource Strain: Not on file  Food Insecurity: Not on file  Housing: Not on file  Physical Activity: Not on file  Social Connections: Not on file  Stress: Not on file  Tobacco Use: High Risk (06/11/2022)   Patient History    Smoking Tobacco Use: Every Day    Smokeless Tobacco Use: Former    Passive Exposure: Not on Chartered certified accountant Needs: Not on file    Tobacco Cessation:  N/A, patient does not currently use tobacco products  Current Medications:  Current Facility-Administered Medications  Medication Dose Route Frequency  Provider Last Rate Last Admin   acetaminophen (TYLENOL) tablet 650 mg  650 mg Oral Q6H PRN Sindy Guadeloupe, NP       alum & mag hydroxide-simeth (MAALOX/MYLANTA) 200-200-20 MG/5ML suspension 30 mL  30 mL Oral Q4H PRN Sindy Guadeloupe, NP       carbamazepine (TEGRETOL) tablet 200 mg  200 mg Oral TID Princess Bruins, DO   200 mg at 06/12/22 1400   DULoxetine (CYMBALTA) DR capsule 60 mg  60 mg Oral Daily Princess Bruins, DO   60 mg at 06/12/22 1359   gabapentin (NEURONTIN) capsule 300 mg  300 mg Oral TID Princess Bruins, DO   300 mg at 06/12/22 1359   hydrOXYzine (ATARAX)  tablet 25 mg  25 mg Oral Q6H PRN Sindy Guadeloupe, NP       loperamide (IMODIUM) capsule 2-4 mg  2-4 mg Oral PRN Sindy Guadeloupe, NP       LORazepam (ATIVAN) tablet 1 mg  1 mg Oral Q6H PRN Sindy Guadeloupe, NP   1 mg at 06/12/22 5643   LORazepam (ATIVAN) tablet 1 mg  1 mg Oral QID Sindy Guadeloupe, NP   1 mg at 06/12/22 1400   Followed by   Melene Muller ON 06/13/2022] LORazepam (ATIVAN) tablet 1 mg  1 mg Oral TID Sindy Guadeloupe, NP       Followed by   Melene Muller ON 06/14/2022] LORazepam (ATIVAN) tablet 1 mg  1 mg Oral BID Sindy Guadeloupe, NP       Followed by   Melene Muller ON 06/16/2022] LORazepam (ATIVAN) tablet 1 mg  1 mg Oral Daily Sindy Guadeloupe, NP       magnesium hydroxide (MILK OF MAGNESIA) suspension 30 mL  30 mL Oral Daily PRN Sindy Guadeloupe, NP       multivitamin with minerals tablet 1 tablet  1 tablet Oral Daily Sindy Guadeloupe, NP   1 tablet at 06/12/22 0931   nicotine (NICODERM CQ - dosed in mg/24 hours) patch 14 mg  14 mg Transdermal Daily Princess Bruins, DO       ondansetron (ZOFRAN-ODT) disintegrating tablet 4 mg  4 mg Oral Q6H PRN Sindy Guadeloupe, NP       pantoprazole (PROTONIX) EC tablet 40 mg  40 mg Oral Daily Princess Bruins, DO   40 mg at 06/12/22 1359   [START ON 06/13/2022] thiamine tablet 100 mg  100 mg Oral Daily Sindy Guadeloupe, NP       Current Outpatient Medications  Medication Sig Dispense Refill   carbamazepine (TEGRETOL) 200 MG tablet Take 1 tablet  (200 mg total) by mouth 3 (three) times daily. (Patient taking differently: Take 200 mg by mouth daily.) 90 tablet 0   DULoxetine (CYMBALTA) 60 MG capsule Take 1 capsule (60 mg total) by mouth daily. 30 capsule 0   gabapentin (NEURONTIN) 300 MG capsule Take 1 capsule (300 mg total) by mouth 3 (three) times daily. 90 capsule 0   hydrOXYzine (ATARAX) 50 MG tablet Take 1 tablet (50 mg total) by mouth 3 (three) times daily as needed for anxiety. (Patient taking differently: Take 50 mg by mouth daily as needed for anxiety.) 30 tablet 0   Multiple Vitamin (MULTIVITAMIN PO) Take 1 tablet by mouth daily.     nicotine (NICODERM CQ - DOSED IN MG/24 HOURS) 14 mg/24hr patch Place 1 patch (14 mg total) onto the skin daily. (Patient not taking: Reported on 06/11/2022) 28 patch 0   pantoprazole (PROTONIX) 40 MG tablet Take 1 tablet (40 mg total) by mouth daily. 30 tablet 0   traZODone (DESYREL) 100 MG tablet Take 1 tablet (100 mg total) by mouth at bedtime as needed for sleep. 30 tablet 0    PTA Medications: (Not in a hospital admission)      06/11/2022    9:33 PM 05/29/2022    2:01 PM 04/12/2021    2:51 PM  Depression screen PHQ 2/9  Decreased Interest 2 1 2   Down, Depressed, Hopeless 3 2 3   PHQ - 2 Score 5 3 5   Altered sleeping 3 1 3   Tired, decreased energy 2 1 2   Change in appetite 2 1 2   Feeling bad or failure about yourself  2 2 3   Trouble concentrating 1 2  3  Moving slowly or fidgety/restless 1 0 3  Suicidal thoughts 1 1 0  PHQ-9 Score 17 11 21   Difficult doing work/chores Very difficult Somewhat difficult     Flowsheet Row ED from 06/12/2022 in [facility] ED from 06/11/2022 in [facility] EMERGENCY DEPARTMENT ED from 05/29/2022 in [facility]  C-SSRS RISK CATEGORY High Risk Vaughan Risk High Risk       Musculoskeletal  Strength & Muscle Tone: within normal limits Gait & Station: normal Patient leans: N/A  Psychiatric  Specialty Exam  Presentation  General Appearance: Disheveled (Old scars on left arm)  Eye Contact:Poor  Speech:Clear and Coherent; Slow  Speech Volume:Decreased  Handedness:Right   Mood and Affect  Mood:Depressed; Hopeless  Affect:Restricted; Congruent   Thought Process  Thought Processes:Coherent; Goal Directed; Linear  Descriptions of Associations:Intact  Orientation:Full (Time, Place and Person)  Thought Content:Rumination  Diagnosis of Schizophrenia or Schizoaffective disorder in past: Vaughan    Hallucinations:Hallucinations: None Description of Visual Hallucinations: Denied  Ideas of Reference:None  Suicidal Thoughts:Suicidal Thoughts: Yes, Active SI Active Intent and/or Plan: With Intent; With Plan; With Means to Carry Out; With Access to Means (Use a knife to stab himself or wreck his car John Vaughan and accident)  Homicidal Thoughts:Homicidal Thoughts: Vaughan   Sensorium  Memory:Immediate Fair  Judgment:Fair (Voluntary admission to the hospital)  Insight:Lacking   Executive Functions  Concentration:Good  Attention Span:Good  Recall:Good  Fund of Knowledge:Good  Language:Good   Psychomotor Activity  Psychomotor Activity:Psychomotor Activity: Normal   Assets  Assets:Communication Skills; Desire for Improvement; Housing   Sleep  Sleep:Sleep: Poor   Nutritional Assessment (For OBS and FBC admissions only) Has the patient had a weight loss or gain of 10 pounds or more in the last 3 months?: Vaughan Has the patient had a decrease in food intake/or appetite?: Vaughan Does the patient have dental problems?: Vaughan Does the patient have eating habits or behaviors that may be indicators of an eating disorder including binging or inducing vomiting?: Vaughan Has the patient recently lost weight without trying?: 0    Physical Exam  Physical Exam Vitals and nursing note reviewed.  HENT:     Head: Normocephalic and atraumatic.  Pulmonary:     Effort: Pulmonary effort  is normal. Vaughan respiratory distress.  Neurological:     General: Vaughan focal deficit present.     Mental Status: He is alert.    Review of Systems  Respiratory:  Negative for shortness of breath.   Cardiovascular:  Negative for chest pain.  Gastrointestinal:  Negative for nausea and vomiting.  Neurological:  Negative for dizziness and headaches.   Blood pressure 105/72, pulse (!) 0, temperature 98 F (36.7 C), temperature source Oral, resp. rate 16, SpO2 97 %. There is Vaughan height or weight on file to calculate BMI.  Demographic Factors:  Male, Caucasian, [facility], lesbian, or bisexual orientation, and Unemployed  Loss Factors: Loss of significant relationship  Historical Factors: Prior suicide attempts, Family history of mental illness or substance abuse, and Impulsivity  Risk Reduction Factors:   Living with another person, especially a relative  Continued Clinical Symptoms:  Depression:   Comorbid alcohol abuse/dependence Hopelessness Impulsivity Insomnia Severe Alcohol/Substance Abuse/Dependencies  Cognitive Features That Contribute To Risk:  Closed-mindedness, Loss of executive function, Polarized thinking, and Thought constriction (tunnel vision)    Suicide Risk:  Severe:  Frequent, intense, and enduring suicidal ideation, specific plan, Vaughan subjective intent, but some objective markers of intent (  i.e., choice of lethal method), the method is accessible, some limited preparatory behavior, evidence of impaired self-control, severe dysphoria/symptomatology, multiple risk factors present, and few if any protective factors, particularly a lack of social support.  Plan Of Care/Follow-up recommendations:  Activity and diet as tolerated.  Disposition: [facility] health Hospital for higher level of care  Princess BruinsJulie Dustine Bertini, DO 06/12/2022, 5:15 PM

## 2022-06-12 NOTE — ED Notes (Signed)
Safe Transport requested to [facility]. 

## 2022-06-13 ENCOUNTER — Encounter (HOSPITAL_COMMUNITY): Payer: Self-pay | Admitting: Emergency Medicine

## 2022-06-13 ENCOUNTER — Encounter (HOSPITAL_COMMUNITY): Payer: Self-pay

## 2022-06-13 ENCOUNTER — Inpatient Hospital Stay (HOSPITAL_COMMUNITY)
Admission: AD | Admit: 2022-06-13 | Discharge: 2022-06-16 | DRG: 885 | Disposition: A | Discharge status: Home / Self Care | Payer: No Typology Code available for payment source | Source: Intra-hospital | Attending: Emergency Medicine | Admitting: Emergency Medicine

## 2022-06-13 DIAGNOSIS — F4310 Post-traumatic stress disorder, unspecified: Secondary | ICD-10-CM | POA: Diagnosis present

## 2022-06-13 DIAGNOSIS — Z91148 Patient's other noncompliance with medication regimen for other reason: Secondary | ICD-10-CM

## 2022-06-13 DIAGNOSIS — F10229 Alcohol dependence with intoxication, unspecified: Secondary | ICD-10-CM | POA: Diagnosis present

## 2022-06-13 DIAGNOSIS — G40909 Epilepsy, unspecified, not intractable, without status epilepticus: Secondary | ICD-10-CM | POA: Diagnosis present

## 2022-06-13 DIAGNOSIS — F411 Generalized anxiety disorder: Secondary | ICD-10-CM | POA: Diagnosis present

## 2022-06-13 DIAGNOSIS — F329 Major depressive disorder, single episode, unspecified: Secondary | ICD-10-CM | POA: Diagnosis present

## 2022-06-13 DIAGNOSIS — Z79899 Other long term (current) drug therapy: Secondary | ICD-10-CM

## 2022-06-13 DIAGNOSIS — Z20822 Contact with and (suspected) exposure to covid-19: Secondary | ICD-10-CM | POA: Diagnosis present

## 2022-06-13 DIAGNOSIS — G4700 Insomnia, unspecified: Secondary | ICD-10-CM | POA: Diagnosis present

## 2022-06-13 DIAGNOSIS — F332 Major depressive disorder, recurrent severe without psychotic features: Principal | ICD-10-CM | POA: Diagnosis present

## 2022-06-13 DIAGNOSIS — K219 Gastro-esophageal reflux disease without esophagitis: Secondary | ICD-10-CM | POA: Diagnosis present

## 2022-06-13 DIAGNOSIS — F10920 Alcohol use, unspecified with intoxication, uncomplicated: Secondary | ICD-10-CM | POA: Diagnosis present

## 2022-06-13 DIAGNOSIS — K5900 Constipation, unspecified: Secondary | ICD-10-CM | POA: Diagnosis present

## 2022-06-13 DIAGNOSIS — F10231 Alcohol dependence with withdrawal delirium: Secondary | ICD-10-CM | POA: Diagnosis present

## 2022-06-13 DIAGNOSIS — F10129 Alcohol abuse with intoxication, unspecified: Secondary | ICD-10-CM | POA: Diagnosis present

## 2022-06-13 DIAGNOSIS — F1410 Cocaine abuse, uncomplicated: Secondary | ICD-10-CM | POA: Diagnosis present

## 2022-06-13 DIAGNOSIS — R45851 Suicidal ideations: Secondary | ICD-10-CM | POA: Diagnosis present

## 2022-06-13 DIAGNOSIS — I4510 Unspecified right bundle-branch block: Secondary | ICD-10-CM | POA: Diagnosis present

## 2022-06-13 DIAGNOSIS — K30 Functional dyspepsia: Secondary | ICD-10-CM | POA: Diagnosis present

## 2022-06-13 DIAGNOSIS — E876 Hypokalemia: Secondary | ICD-10-CM | POA: Diagnosis present

## 2022-06-13 DIAGNOSIS — F333 Major depressive disorder, recurrent, severe with psychotic symptoms: Secondary | ICD-10-CM | POA: Diagnosis present

## 2022-06-13 DIAGNOSIS — F17210 Nicotine dependence, cigarettes, uncomplicated: Secondary | ICD-10-CM | POA: Diagnosis present

## 2022-06-13 DIAGNOSIS — Z818 Family history of other mental and behavioral disorders: Secondary | ICD-10-CM

## 2022-06-13 MED ORDER — LORAZEPAM 1 MG PO TABS: 1.0000 mg | ORAL_TABLET | Freq: Four times a day (QID) | ORAL | Status: DC

## 2022-06-13 MED ORDER — LORAZEPAM 1 MG PO TABS
1.0000 mg | ORAL_TABLET | Freq: Every day | ORAL | Status: AC
  Administered 2022-06-16: 1 mg via ORAL
  Filled 2022-06-13: qty 1

## 2022-06-13 MED ORDER — LORAZEPAM 1 MG PO TABS
1.0000 mg | ORAL_TABLET | Freq: Four times a day (QID) | ORAL | Status: AC | PRN
  Administered 2022-06-14: 1 mg via ORAL
  Filled 2022-06-13 (×2): qty 1

## 2022-06-13 MED ORDER — POTASSIUM CHLORIDE 20 MEQ PO PACK
40.0000 meq | PACK | Freq: Once | ORAL | Status: AC
  Administered 2022-06-13: 40 meq via ORAL
  Filled 2022-06-13 (×2): qty 2

## 2022-06-13 MED ORDER — LOPERAMIDE HCL 2 MG PO CAPS: 2.0000 mg | ORAL_CAPSULE | ORAL | Status: AC | PRN

## 2022-06-13 MED ORDER — CARBAMAZEPINE 200 MG PO TABS
200.0000 mg | ORAL_TABLET | Freq: Every day | ORAL | Status: DC
  Administered 2022-06-13 – 2022-06-16 (×4): 200 mg via ORAL
  Filled 2022-06-13 (×6): qty 1

## 2022-06-13 MED ORDER — LORAZEPAM 1 MG PO TABS
1.0000 mg | ORAL_TABLET | Freq: Three times a day (TID) | ORAL | Status: AC
  Administered 2022-06-14 (×3): 1 mg via ORAL
  Filled 2022-06-13 (×3): qty 1

## 2022-06-13 MED ORDER — THIAMINE HCL 100 MG PO TABS
100.0000 mg | ORAL_TABLET | Freq: Every day | ORAL | Status: DC
  Administered 2022-06-13 – 2022-06-15 (×3): 100 mg via ORAL
  Filled 2022-06-13 (×6): qty 1

## 2022-06-13 MED ORDER — LORAZEPAM 1 MG PO TABS
1.0000 mg | ORAL_TABLET | Freq: Four times a day (QID) | ORAL | Status: AC
  Administered 2022-06-13 (×4): 1 mg via ORAL
  Filled 2022-06-13 (×3): qty 1

## 2022-06-13 MED ORDER — PANTOPRAZOLE SODIUM 40 MG PO TBEC
40.0000 mg | DELAYED_RELEASE_TABLET | Freq: Every day | ORAL | Status: DC
  Administered 2022-06-13 – 2022-06-16 (×4): 40 mg via ORAL
  Filled 2022-06-13 (×6): qty 1

## 2022-06-13 MED ORDER — HYDROXYZINE HCL 25 MG PO TABS
25.0000 mg | ORAL_TABLET | Freq: Four times a day (QID) | ORAL | Status: AC | PRN
  Administered 2022-06-13: 25 mg via ORAL
  Filled 2022-06-13: qty 1
  Filled 2022-06-13: qty 10

## 2022-06-13 MED ORDER — LORAZEPAM 1 MG PO TABS: 1.0000 mg | ORAL_TABLET | Freq: Two times a day (BID) | ORAL | Status: DC

## 2022-06-13 MED ORDER — DULOXETINE HCL 60 MG PO CPEP
60.0000 mg | ORAL_CAPSULE | Freq: Every day | ORAL | Status: DC
  Administered 2022-06-13 – 2022-06-16 (×4): 60 mg via ORAL
  Filled 2022-06-13 (×6): qty 1

## 2022-06-13 MED ORDER — ADULT MULTIVITAMIN W/MINERALS CH
1.0000 | ORAL_TABLET | Freq: Every day | ORAL | Status: DC
  Administered 2022-06-13 – 2022-06-16 (×4): 1 via ORAL
  Filled 2022-06-13 (×6): qty 1

## 2022-06-13 MED ORDER — ONDANSETRON 4 MG PO TBDP: 4.0000 mg | ORAL_TABLET | Freq: Four times a day (QID) | ORAL | Status: AC | PRN

## 2022-06-13 MED ORDER — GABAPENTIN 300 MG PO CAPS
300.0000 mg | ORAL_CAPSULE | Freq: Three times a day (TID) | ORAL | Status: DC
  Administered 2022-06-13 – 2022-06-16 (×10): 300 mg via ORAL
  Filled 2022-06-13 (×16): qty 1

## 2022-06-13 MED ORDER — NICOTINE 14 MG/24HR TD PT24
14.0000 mg | MEDICATED_PATCH | Freq: Every day | TRANSDERMAL | Status: DC
  Administered 2022-06-13 – 2022-06-15 (×3): 14 mg via TRANSDERMAL
  Filled 2022-06-13 (×6): qty 1

## 2022-06-13 MED ORDER — LORAZEPAM 1 MG PO TABS: 1.0000 mg | ORAL_TABLET | Freq: Every day | ORAL | Status: DC

## 2022-06-13 MED ORDER — LORAZEPAM 1 MG PO TABS
1.0000 mg | ORAL_TABLET | Freq: Two times a day (BID) | ORAL | Status: AC
  Administered 2022-06-15 (×2): 1 mg via ORAL
  Filled 2022-06-13 (×2): qty 1

## 2022-06-13 MED ORDER — LORAZEPAM 1 MG PO TABS: 1.0000 mg | ORAL_TABLET | Freq: Three times a day (TID) | ORAL | Status: DC

## 2022-06-13 MED ORDER — TRAZODONE HCL 50 MG PO TABS
50.0000 mg | ORAL_TABLET | Freq: Every evening | ORAL | Status: DC | PRN
  Administered 2022-06-13 – 2022-06-14 (×2): 50 mg via ORAL
  Filled 2022-06-13: qty 1
  Filled 2022-06-13: qty 7
  Filled 2022-06-13: qty 1

## 2022-06-13 MED ORDER — TRAZODONE HCL 50 MG PO TABS
50.0000 mg | ORAL_TABLET | Freq: Once | ORAL | Status: AC
Start: 2022-06-13 — End: 2022-06-13
  Administered 2022-06-13: 50 mg via ORAL
  Filled 2022-06-13 (×2): qty 1

## 2022-06-13 NOTE — BH IP Treatment Plan (Signed)
Interdisciplinary Treatment and Diagnostic Plan Update  06/13/2022 Time of Session: 9:40am  John Vaughan MRN: [id number]  Principal Diagnosis: <principal problem not specified>  Secondary Diagnoses: Active Problems:   * No active hospital problems. *   Current Medications:  Current Facility-Administered Medications  Medication Dose Route Frequency Provider Last Rate Last Admin   carbamazepine (TEGRETOL) tablet 200 mg  200 mg Oral Daily Merrily Brittle, DO   200 mg at 06/13/22 0811   DULoxetine (CYMBALTA) DR capsule 60 mg  60 mg Oral Daily Merrily Brittle, DO   60 mg at 06/13/22 3734   gabapentin (NEURONTIN) capsule 300 mg  300 mg Oral TID Merrily Brittle, DO   300 mg at 06/13/22 2876   hydrOXYzine (ATARAX) tablet 25 mg  25 mg Oral Q6H PRN Merrily Brittle, DO   25 mg at 06/13/22 0120   loperamide (IMODIUM) capsule 2-4 mg  2-4 mg Oral PRN Merrily Brittle, DO       LORazepam (ATIVAN) tablet 1 mg  1 mg Oral Q6H PRN Harlow Asa, MD       LORazepam (ATIVAN) tablet 1 mg  1 mg Oral QID Armando Reichert, MD   1 mg at 06/13/22 1000   Followed by   Derrill Memo ON 06/14/2022] LORazepam (ATIVAN) tablet 1 mg  1 mg Oral TID Armando Reichert, MD       Followed by   Derrill Memo ON 06/15/2022] LORazepam (ATIVAN) tablet 1 mg  1 mg Oral BID Armando Reichert, MD       Followed by   Derrill Memo ON 06/16/2022] LORazepam (ATIVAN) tablet 1 mg  1 mg Oral Daily Armando Reichert, MD       multivitamin with minerals tablet 1 tablet  1 tablet Oral Daily Merrily Brittle, DO   1 tablet at 06/13/22 8115   nicotine (NICODERM CQ - dosed in mg/24 hours) patch 14 mg  14 mg Transdermal Daily Merrily Brittle, DO   14 mg at 06/13/22 0809   ondansetron (ZOFRAN-ODT) disintegrating tablet 4 mg  4 mg Oral Q6H PRN Merrily Brittle, DO       pantoprazole (PROTONIX) EC tablet 40 mg  40 mg Oral Daily Merrily Brittle, DO   40 mg at 06/13/22 7262   thiamine tablet 100 mg  100 mg Oral Q supper Merrily Brittle, DO       traZODone (DESYREL) tablet 50 mg  50 mg Oral QHS PRN  Armando Reichert, MD       PTA Medications: Medications Prior to Admission  Medication Sig Dispense Refill Last Dose   carbamazepine (TEGRETOL) 200 MG tablet Take 1 tablet (200 mg total) by mouth 3 (three) times daily. (Patient taking differently: Take 200 mg by mouth daily.) 90 tablet 0    DULoxetine (CYMBALTA) 60 MG capsule Take 1 capsule (60 mg total) by mouth daily. 30 capsule 0    gabapentin (NEURONTIN) 300 MG capsule Take 1 capsule (300 mg total) by mouth 3 (three) times daily. 90 capsule 0    hydrOXYzine (ATARAX) 50 MG tablet Take 1 tablet (50 mg total) by mouth 3 (three) times daily as needed for anxiety. (Patient taking differently: Take 50 mg by mouth daily as needed for anxiety.) 30 tablet 0    Multiple Vitamin (MULTIVITAMIN PO) Take 1 tablet by mouth daily.      nicotine (NICODERM CQ - DOSED IN MG/24 HOURS) 14 mg/24hr patch Place 1 patch (14 mg total) onto the skin daily. (Patient not taking: Reported on 06/11/2022) 28 patch 0  pantoprazole (PROTONIX) 40 MG tablet Take 1 tablet (40 mg total) by mouth daily. 30 tablet 0    thiamine 100 MG tablet Take 1 tablet (100 mg total) by mouth daily with supper.       Patient Stressors: Financial difficulties   Marital or family conflict   Medication change or noncompliance   Substance abuse    Patient Strengths: Active sense of humor  Communication skills  Motivation for treatment/growth  Supportive family/friends   Treatment Modalities: Medication Management, Group therapy, Case management,  1 to 1 session with clinician, Psychoeducation, Recreational therapy.   Physician Treatment Plan for Primary Diagnosis: <principal problem not specified> Long Term Goal(s):     Short Term Goals:    Medication Management: Evaluate patient's response, side effects, and tolerance of medication regimen.  Therapeutic Interventions: 1 to 1 sessions, Unit Group sessions and Medication administration.  Evaluation of Outcomes: Not Met  Physician  Treatment Plan for Secondary Diagnosis: Active Problems:   * No active hospital problems. *  Long Term Goal(s):     Short Term Goals:       Medication Management: Evaluate patient's response, side effects, and tolerance of medication regimen.  Therapeutic Interventions: 1 to 1 sessions, Unit Group sessions and Medication administration.  Evaluation of Outcomes: Not Met   RN Treatment Plan for Primary Diagnosis: <principal problem not specified> Long Term Goal(s): Knowledge of disease and therapeutic regimen to maintain health will improve  Short Term Goals: Ability to remain free from injury will improve, Ability to participate in decision making will improve, Ability to verbalize feelings will improve, Ability to disclose and discuss suicidal ideas, and Ability to identify and develop effective coping behaviors will improve  Medication Management: RN will administer medications as ordered by provider, will assess and evaluate patient's response and provide education to patient for prescribed medication. RN will report any adverse and/or side effects to prescribing provider.  Therapeutic Interventions: 1 on 1 counseling sessions, Psychoeducation, Medication administration, Evaluate responses to treatment, Monitor vital signs and CBGs as ordered, Perform/monitor CIWA, COWS, AIMS and Fall Risk screenings as ordered, Perform wound care treatments as ordered.  Evaluation of Outcomes: Not Met   LCSW Treatment Plan for Primary Diagnosis: <principal problem not specified> Long Term Goal(s): Safe transition to appropriate next level of care at discharge, Engage patient in therapeutic group addressing interpersonal concerns.  Short Term Goals: Engage patient in aftercare planning with referrals and resources, Increase social support, Increase emotional regulation, Facilitate acceptance of mental health diagnosis and concerns, Identify triggers associated with mental health/substance abuse issues,  and Increase skills for wellness and recovery  Therapeutic Interventions: Assess for all discharge needs, 1 to 1 time with Social worker, Explore available resources and support systems, Assess for adequacy in community support network, Educate family and significant other(s) on suicide prevention, Complete Psychosocial Assessment, Interpersonal group therapy.  Evaluation of Outcomes: Not Met   Progress in Treatment: Attending groups: Yes. Participating in groups: Yes. Taking medication as prescribed: Yes. Toleration medication: Yes. Family/Significant other contact made: Yes, individual(s) contacted:  Mother  Patient understands diagnosis: Yes. Discussing patient identified problems/goals with staff: Yes. Medical problems stabilized or resolved: Yes. Denies suicidal/homicidal ideation: Yes. Issues/concerns per patient self-inventory: No.   New problem(s) identified: No, Describe:  None   New Short Term/Long Term Goal(s): medication stabilization, elimination of SI thoughts, development of comprehensive mental wellness plan.   Patient Goals: "To better manage my depression and stop my suicidal thoughts"   Discharge Plan or Barriers:  Patient recently admitted. CSW will continue to follow and assess for appropriate referrals and possible discharge planning.   Reason for Continuation of Hospitalization: Anxiety Depression Hallucinations Medication stabilization Suicidal ideation  Estimated Length of Stay: 3 to 7 days   Last 3 [place] Suicide Severity Risk Score: Flowsheet Row Admission (Current) from 06/13/2022 in [place] [facility] ED from 06/12/2022 in [facility] ED from 06/11/2022 in [place] No Risk High Risk No Risk       Last PHQ 2/9 Scores:    06/11/2022    9:33 PM 05/29/2022    2:01 PM 04/12/2021    2:51 PM  Depression screen PHQ 2/9  Decreased  Interest 2 1 2   Down, Depressed, Hopeless 3 2 3   PHQ - 2 Score 5 3 5   Altered sleeping 3 1 3   Tired, decreased energy 2 1 2   Change in appetite 2 1 2   Feeling bad or failure about yourself  2 2 3   Trouble concentrating 1 2 3   Moving slowly or fidgety/restless 1 0 3  Suicidal thoughts 1 1 0  PHQ-9 Score 17 11 21   Difficult doing work/chores Very difficult Somewhat difficult     Scribe for Treatment Team: Darleen Crocker, Latanya Presser 06/13/2022 11:47 AM

## 2022-06-13 NOTE — [facility] Group Notes (Signed)
PT came to NA and was appropriate and attentive. 

## 2022-06-13 NOTE — [facility] Counselor (Signed)
CSW spoke with the Pt who states that he is not interested in 30-Day residential treatment or SAIOP services at this time.  He states that he has not attended any of his prior outpatient appointments due to being unable to remember his appointments.  CSW discussed with the Pt ways to help him remember when his appointments are scheduled and the importance of going to and maintaining these appointments.

## 2022-06-13 NOTE — Tx Team (Signed)
Initial Treatment Plan 06/13/2022 12:59 AM John Vaughan ZDG:[id number]    PATIENT STRESSORS: Financial difficulties   Marital or family conflict   Medication change or noncompliance   Substance abuse     PATIENT STRENGTHS: Active sense of humor  Communication skills  Motivation for treatment/growth  Supportive family/friends    PATIENT IDENTIFIED PROBLEMS: Substance abuse  Depression  "Make pain go away"  "More motivations"               DISCHARGE CRITERIA:  Ability to meet basic life and health needs Improved stabilization in mood, thinking, and/or behavior Motivation to continue treatment in a less acute level of care Verbal commitment to aftercare and medication compliance  PRELIMINARY DISCHARGE PLAN: Attend aftercare/continuing care group Attend PHP/IOP Attend 12-step recovery group Outpatient therapy Return to previous living arrangement  PATIENT/FAMILY INVOLVEMENT: This treatment plan has been presented to and reviewed with the patient, John Vaughan, and/or family member.  The patient and family have been given the opportunity to ask questions and make suggestions.  Bethann Punches, RN 06/13/2022, 12:59 AM
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
## 2022-06-13 NOTE — Progress Notes (Signed)
   06/13/22 1000  Psych Admission Type (Psych Patients Only)  Admission Status Voluntary  Psychosocial Assessment  Patient Complaints Anxiety;Depression;Substance abuse  Eye Contact Fair  Facial Expression Flat;Sad;Anxious  Affect Depressed;Flat  Speech Logical/coherent  Interaction Assertive  Motor Activity Slow  Behavior Characteristics Cooperative;Appropriate to situation  Mood Depressed  Thought Process  Coherency WDL  Content Blaming self  Delusions None reported or observed  Perception WDL  Hallucination None reported or observed  Judgment Poor  Confusion None  Danger to Self  Current suicidal ideation? Denies  Self-Injurious Behavior No self-injurious ideation or behavior indicators observed or expressed   Agreement Not to Harm Self Yes  Description of Agreement Verbal  Danger to Others  Danger to Others None reported or observed

## 2022-06-13 NOTE — Group Note (Signed)
LCSW Group Therapy Note   Group Date: 06/13/2022 Start Time: 1300 End Time: 1400   Type of Therapy and Topic:  Group Therapy: Boundaries  Participation Level:  Active  Description of Group: This group will address the use of boundaries in their personal lives. Patients will explore why boundaries are important, the difference between healthy and unhealthy boundaries, and negative and postive outcomes of different boundaries and will look at how boundaries can be crossed.  Patients will be encouraged to identify current boundaries in their own lives and identify what kind of boundary is being set. Facilitators will guide patients in utilizing problem-solving interventions to address and correct types boundaries being used and to address when no boundary is being used. Understanding and applying boundaries will be explored and addressed for obtaining and maintaining a balanced life. Patients will be encouraged to explore ways to assertively make their boundaries and needs known to significant others in their lives, using other group members and facilitator for role play, support, and feedback.  Therapeutic Goals:  1.  Patient will identify areas in their life where setting clear boundaries could be  used to improve their life.  2.  Patient will identify signs/triggers that a boundary is not being respected. 3.  Patient will identify two ways to set boundaries in order to achieve balance in  their lives: 4.  Patient will demonstrate ability to communicate their needs and set boundaries  through discussion and/or role plays  Summary of Patient Progress:  The Pt was present/active throughout the session and proved open to feedback from CSW and peers. Patient demonstrated insight into the subject matter, was respectful of peers, and was present throughout the entire session.  Therapeutic Modalities:   Cognitive Behavioral Therapy Solution-Focused Therapy  Eryn Krejci M Calan Doren, LCSWA 06/13/2022  1:54  PM    

## 2022-06-13 NOTE — [facility] Counselor (Signed)
CSW provided the Pt with a packet that contains information including shelter and housing resources, free and reduced price food information, clothing resources, crisis center information, a Engineer, civil (consulting) card, a list of [employer], and suicide prevention information.

## 2022-06-13 NOTE — Progress Notes (Signed)
John Vaughan is a 31 y.o. male voluntarily admitted for suicide ideation with a plan to stab himself with a knife. Pt has a history self harm behaviors. Pt stated that he received a new from his mom that made him feel like kill himself. Pt also stated that he has been drinking a lot and using crack cocaine. Pt has been alert and oriented, cooperative with admission process. Denied SI/HI and contracted for safety. Skin searched completed and noted to be having laceration to let arm. No contraband found in his belongings. Consents signed and pt oriented to unit. Pt stable at this time. Pt given the opportunity to express concerns and ask questions. Pt given toiletries. Will continue to monitor.
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
## 2022-06-13 NOTE — Group Note (Signed)
Recreation Therapy Group Note   Group Topic:Stress Management  Group Date: 06/13/2022 Start Time: 0935 End Time: 0948 Facilitators: Caroll Rancher, [place] Location: [address] Dayroom   Goal Area(s) Addresses:  Patient will identify positive stress management techniques. Patient will identify benefits of using stress management post d/c.  Group Description:  [facility] Meditation.  Patients were to relax and listen to the meditation, which focused on taking on all of the characteristics a mountain has to offer.  Patients were to picture themselves weathering what life throws at them without giving up, just as the mountain stands tall no matter goes on around it.    Affect/Mood: Appropriate   Participation Level: Engaged   Participation Quality: Independent   Behavior: Appropriate   Speech/Thought Process: Focused   Insight: Good   Judgement: Good   Modes of Intervention: Meditation   Patient Response to Interventions:  Engaged   Education Outcome:  Acknowledges education and In group clarification offered    Clinical Observations/Individualized Feedback: Pt attended and participated in group session.    Plan: Continue to engage patient in RT group sessions 2-3x/week.   Caroll Rancher, LRT,CTRS  06/13/2022 12:22 PM
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
## 2022-06-14 LAB — BASIC METABOLIC PANEL
Anion gap: 10 (ref 5–15)
BUN: 15 mg/dL (ref 6–20)
CO2: 23 mmol/L (ref 22–32)
Calcium: 10 mg/dL (ref 8.9–10.3)
Chloride: 105 mmol/L (ref 98–111)
Creatinine, Ser: 0.91 mg/dL (ref 0.61–1.24)
GFR, Estimated: >60 mL/min (ref >60)
Glucose, Bld: 120 mg/dL — ABNORMAL HIGH (ref 70–99)
Potassium: 3.9 mmol/L (ref 3.5–5.1)
Sodium: 138 mmol/L (ref 135–145)

## 2022-06-14 LAB — METHYLMALONIC ACID, SERUM: Methylmalonic Acid, Quantitative: 282 nmol/L (ref 0–378)

## 2022-06-14 LAB — CARBAMAZEPINE LEVEL, TOTAL: Carbamazepine Lvl: 5.7 ug/mL (ref 4.0–12.0)

## 2022-06-14 NOTE — Progress Notes (Signed)
   06/13/22 2200  Psych Admission Type (Psych Patients Only)  Admission Status Voluntary  Psychosocial Assessment  Patient Complaints Anxiety  Eye Contact Fair  Facial Expression Animated  Affect Appropriate to circumstance  Speech Logical/coherent  Interaction Assertive  Motor Activity Slow  Appearance/Hygiene Unremarkable  Behavior Characteristics Cooperative;Appropriate to situation  Mood Pleasant  Thought Process  Coherency WDL  Content WDL  Delusions None reported or observed  Perception WDL  Hallucination None reported or observed  Judgment Poor  Confusion None  Danger to Self  Current suicidal ideation? Denies  Self-Injurious Behavior No self-injurious ideation or behavior indicators observed or expressed   Agreement Not to Harm Self Yes  Description of Agreement verbal  Danger to Others  Danger to Others None reported or observed

## 2022-06-14 NOTE — Progress Notes (Signed)
   06/14/22 0824  Psych Admission Type (Psych Patients Only)  Admission Status Voluntary  Psychosocial Assessment  Patient Complaints Substance abuse  Eye Contact Fair  Facial Expression Animated  Affect Appropriate to circumstance  Speech Logical/coherent  Interaction Assertive  Motor Activity Other (Comment) (WNL)  Appearance/Hygiene Unremarkable  Behavior Characteristics Cooperative  Mood Euthymic  Thought Process  Coherency WDL  Content WDL  Delusions None reported or observed  Perception WDL  Hallucination None reported or observed  Judgment Poor  Confusion None  Danger to Self  Current suicidal ideation? Denies  Self-Injurious Behavior No self-injurious ideation or behavior indicators observed or expressed   Agreement Not to Harm Self Yes  Description of Agreement verbal  Danger to Others  Danger to Others None reported or observed

## 2022-06-14 NOTE — Plan of Care (Signed)
  Problem: Education: Goal: Verbalization of understanding the information provided will improve Outcome: Not Progressing   Problem: Education: Goal: Knowledge of the prescribed therapeutic regimen will improve Outcome: Progressing   Problem: Coping: Goal: Coping ability will improve Outcome: Progressing

## 2022-06-14 NOTE — [facility] Group Notes (Signed)
Adult Relaxation Group Note  Date:  06/14/2022 Time:  2:30 PM  Patient attended and participated in Relaxation group.  John Vaughan 06/14/2022, 2:30 PM 

## 2022-06-14 NOTE — [facility] Suicide Risk Assessment (Signed)
[facility] INPATIENT:  Family/Significant Other Suicide Prevention Education  Suicide Prevention Education:  Education Completed; John Vaughan [phone] (Mother) has been identified by the patient as the family member/significant other with whom the patient will be residing, and identified as the person(s) who will aid the patient in the event of a mental health crisis (suicidal ideations/suicide attempt).  With written consent from the patient, the family member/significant other has been provided the following suicide prevention education, prior to the and/or following the discharge of the patient.  The suicide prevention education provided includes the following: Suicide risk factors Suicide prevention and interventions National Suicide Hotline telephone number [facility] assessment telephone number [facility] Emergency Assistance 911 [facility] and/or Residential Mobile Crisis Unit telephone number  Request made of family/significant other to: Remove weapons (e.g., guns, rifles, knives), all items previously/currently identified as safety concern.   Remove drugs/medications (over-the-counter, prescriptions, illicit drugs), all items previously/currently identified as a safety concern.  The family member/significant other verbalizes understanding of the suicide prevention education information provided.  The family member/significant other agrees to remove the items of safety concern listed above.  CSW spoke with Mrs. John Vaughan who states that her son has been "drinking more often and not taking care of himself".  She states that "he is depressed and will not stay positive about things".  She states that he gets angry at home.  She states that he has no income and cannot afford his medications.  She confirms that he has applied for Disability Benefits and is awaiting the outcome.  Mrs. Tankard confirms that her son can return home after discharge and that there are no  firearms or weapons in the home.  CSW completed SPE with Mrs. John Vaughan.   [name] 06/14/2022, 12:34 PM

## 2022-06-14 NOTE — Progress Notes (Signed)
Pt has received redirection about inappropriate behavior this morning from the MD. Pt said that "he doesn't care" when spoken to. Pt continues to not follow directions, like having poor boundaries, encouraging other patients to rub his back as well as using vulgar language.

## 2022-06-14 NOTE — Progress Notes (Signed)
[facility] Group Notes:  (Nursing/MHT/Case Management/Adjunct)  Date:  06/14/2022  Time:  2020  Type of Therapy:   wrap up group  Participation Level:  Active  Participation Quality:  Inattentive, Redirectable, and Sharing  Affect:  Resistant  Cognitive:  Alert  Insight:  Lacking  Engagement in Group:  Distracting  Modes of Intervention:  Clarification, Education, and Support  Summary of Progress/Problems: Positive thinking and positive change were discussed.   Marcille Buffy 06/14/2022, 9:17 PM

## 2022-06-15 LAB — CBC WITH DIFFERENTIAL/PLATELET
Abs Immature Granulocytes: 0.02 10*3/uL (ref 0.00–0.07)
Basophils Absolute: 0.1 10*3/uL (ref 0.0–0.1)
Basophils Relative: 1 %
Eosinophils Absolute: 0.2 10*3/uL (ref 0.0–0.5)
Eosinophils Relative: 2 %
HCT: 42 % (ref 39.0–52.0)
Hemoglobin: 14.2 g/dL (ref 13.0–17.0)
Immature Granulocytes: 0 %
Lymphocytes Relative: 21 %
Lymphs Abs: 1.3 10*3/uL (ref 0.7–4.0)
MCH: 33.6 pg (ref 26.0–34.0)
MCHC: 33.8 g/dL (ref 30.0–36.0)
MCV: 99.5 fL (ref 80.0–100.0)
Monocytes Absolute: 0.9 10*3/uL (ref 0.1–1.0)
Monocytes Relative: 14 %
Neutro Abs: 3.8 10*3/uL (ref 1.7–7.7)
Neutrophils Relative %: 62 %
Platelets: 241 10*3/uL (ref 150–400)
RBC: 4.22 MIL/uL (ref 4.22–5.81)
RDW: 11.9 % (ref 11.5–15.5)
WBC: 6.3 10*3/uL (ref 4.0–10.5)
nRBC: 0 % (ref 0.0–0.2)

## 2022-06-15 LAB — HEPATIC FUNCTION PANEL
ALT: 19 U/L (ref 0–44)
AST: 19 U/L (ref 15–41)
Albumin: 4.5 g/dL (ref 3.5–5.0)
Alkaline Phosphatase: 51 U/L (ref 38–126)
Bilirubin, Direct: <0.1 mg/dL (ref 0.0–0.2)
Total Bilirubin: 0.4 mg/dL (ref 0.3–1.2)
Total Protein: 7.9 g/dL (ref 6.5–8.1)

## 2022-06-15 MED ORDER — TRAZODONE HCL 100 MG PO TABS
100.0000 mg | ORAL_TABLET | Freq: Every evening | ORAL | Status: DC | PRN
  Administered 2022-06-15: 100 mg via ORAL
  Filled 2022-06-15: qty 7
  Filled 2022-06-15: qty 1

## 2022-06-15 NOTE — Group Note (Signed)

## 2022-06-16 DIAGNOSIS — F332 Major depressive disorder, recurrent severe without psychotic features: Principal | ICD-10-CM

## 2022-06-16 DIAGNOSIS — F333 Major depressive disorder, recurrent, severe with psychotic symptoms: Secondary | ICD-10-CM | POA: Diagnosis present

## 2022-06-16 DIAGNOSIS — F329 Major depressive disorder, single episode, unspecified: Secondary | ICD-10-CM | POA: Diagnosis present

## 2022-06-16 MED ORDER — DULOXETINE HCL 60 MG PO CPEP
60.0000 mg | ORAL_CAPSULE | Freq: Every day | ORAL | 0 refills | Status: DC
Start: 2022-06-16 — End: 2022-07-11

## 2022-06-16 MED ORDER — PANTOPRAZOLE SODIUM 40 MG PO TBEC: 40.0000 mg | DELAYED_RELEASE_TABLET | Freq: Every day | ORAL | 0 refills | Status: DC

## 2022-06-16 MED ORDER — HYDROXYZINE HCL 25 MG PO TABS: 25.0000 mg | ORAL_TABLET | Freq: Four times a day (QID) | ORAL | 0 refills | Status: DC | PRN

## 2022-06-16 MED ORDER — TRAZODONE HCL 100 MG PO TABS: 100.0000 mg | ORAL_TABLET | Freq: Every evening | ORAL | 0 refills | Status: DC | PRN

## 2022-06-16 MED ORDER — NICOTINE 14 MG/24HR TD PT24
14.0000 mg | MEDICATED_PATCH | Freq: Every day | TRANSDERMAL | 0 refills | Status: DC | PRN
Start: 2022-06-16 — End: 2022-08-25

## 2022-06-16 MED ORDER — GABAPENTIN 300 MG PO CAPS: 300.0000 mg | ORAL_CAPSULE | Freq: Three times a day (TID) | ORAL | 0 refills | Status: DC

## 2022-06-16 MED ORDER — THIAMINE HCL 100 MG PO TABS: 100.0000 mg | ORAL_TABLET | Freq: Every day | ORAL | 0 refills | Status: DC

## 2022-06-16 MED ORDER — ADULT MULTIVITAMIN W/MINERALS CH: 1.0000 | ORAL_TABLET | Freq: Every day | ORAL | 0 refills | Status: DC

## 2022-06-16 MED ORDER — CARBAMAZEPINE 200 MG PO TABS: 200.0000 mg | ORAL_TABLET | Freq: Every day | ORAL | 0 refills | Status: DC

## 2022-06-16 NOTE — Progress Notes (Signed)
   06/16/22 1000  Psych Admission Type (Psych Patients Only)  Admission Status Voluntary  Psychosocial Assessment  Patient Complaints None  Eye Contact Fair  Facial Expression Animated  Affect Silly  Speech Logical/coherent  Interaction Assertive  Motor Activity Fidgety;Restless  Appearance/Hygiene Improved  Behavior Characteristics Impulsive  Mood Silly  Thought Process  Coherency WDL  Content WDL  Delusions None reported or observed  Perception WDL  Hallucination None reported or observed  Judgment Poor  Confusion None  Danger to Self  Current suicidal ideation? Denies  Danger to Others  Danger to Others None reported or observed
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
## 2022-06-16 NOTE — [facility] Group Notes (Signed)
.  Psychoeducational Group Note  Date 06/16/2021 Time: 0900-1000    Goal Setting   Purpose of Group: Group Focus: affirmation, clarity of thought, and goals/reality orientation Treatment Modality:  Psychoeducation Interventions utilized were assignment, group exercise, and support  Purpose: To be able to understand and verbalize the reason for their admission to the hospital. To understand that the medication helps with their chemical imbalance but they also need to work on their choices in life. To be challenged to develop a list of 30 positives about themselves. Also introduce the concept that "feelings" are not reality.    Participation Level:  Active  Participation Quality:  Appropriate  Affect:  Appropriate  Cognitive:  Appropriate  Insight:  Improving  Engagement in Group:  Engaged  Additional Comments: Rates his energy at a 10/10. States he is here for medication adjustment and for SI  Dione Housekeeper

## 2022-07-10 ENCOUNTER — Encounter (HOSPITAL_COMMUNITY): Payer: Self-pay | Admitting: Emergency Medicine

## 2022-07-10 ENCOUNTER — Emergency Department (HOSPITAL_COMMUNITY)
Admission: EM | Admit: 2022-07-10 | Discharge: 2022-07-11 | Disposition: A | Discharge status: Home / Self Care | Payer: Self-pay | Attending: Emergency Medicine | Admitting: Emergency Medicine

## 2022-07-10 ENCOUNTER — Other Ambulatory Visit: Payer: Self-pay

## 2022-07-10 DIAGNOSIS — F1994 Other psychoactive substance use, unspecified with psychoactive substance-induced mood disorder: Secondary | ICD-10-CM

## 2022-07-10 DIAGNOSIS — F319 Bipolar disorder, unspecified: Secondary | ICD-10-CM | POA: Diagnosis present

## 2022-07-10 DIAGNOSIS — F1020 Alcohol dependence, uncomplicated: Secondary | ICD-10-CM | POA: Diagnosis present

## 2022-07-10 DIAGNOSIS — F10920 Alcohol use, unspecified with intoxication, uncomplicated: Secondary | ICD-10-CM

## 2022-07-10 DIAGNOSIS — F411 Generalized anxiety disorder: Secondary | ICD-10-CM | POA: Diagnosis present

## 2022-07-10 DIAGNOSIS — F1410 Cocaine abuse, uncomplicated: Secondary | ICD-10-CM

## 2022-07-10 DIAGNOSIS — Y908 Blood alcohol level of 240 mg/100 ml or more: Secondary | ICD-10-CM | POA: Insufficient documentation

## 2022-07-10 DIAGNOSIS — F3130 Bipolar disorder, current episode depressed, mild or moderate severity, unspecified: Secondary | ICD-10-CM | POA: Diagnosis present

## 2022-07-10 DIAGNOSIS — F10129 Alcohol abuse with intoxication, unspecified: Secondary | ICD-10-CM | POA: Insufficient documentation

## 2022-07-10 DIAGNOSIS — F1999 Other psychoactive substance use, unspecified with unspecified psychoactive substance-induced disorder: Secondary | ICD-10-CM | POA: Diagnosis present

## 2022-07-10 LAB — CBC WITH DIFFERENTIAL/PLATELET
Abs Immature Granulocytes: 0.01 10*3/uL (ref 0.00–0.07)
Basophils Absolute: 0.1 10*3/uL (ref 0.0–0.1)
Basophils Relative: 1 %
Eosinophils Absolute: 0.2 10*3/uL (ref 0.0–0.5)
Eosinophils Relative: 3 %
HCT: 41.8 % (ref 39.0–52.0)
Hemoglobin: 14.7 g/dL (ref 13.0–17.0)
Immature Granulocytes: 0 %
Lymphocytes Relative: 50 %
Lymphs Abs: 2.7 10*3/uL (ref 0.7–4.0)
MCH: 33.3 pg (ref 26.0–34.0)
MCHC: 35.2 g/dL (ref 30.0–36.0)
MCV: 94.6 fL (ref 80.0–100.0)
Monocytes Absolute: 0.8 10*3/uL (ref 0.1–1.0)
Monocytes Relative: 14 %
Neutro Abs: 1.7 10*3/uL (ref 1.7–7.7)
Neutrophils Relative %: 32 %
Platelets: 257 10*3/uL (ref 150–400)
RBC: 4.42 MIL/uL (ref 4.22–5.81)
RDW: 11.8 % (ref 11.5–15.5)
WBC: 5.4 10*3/uL (ref 4.0–10.5)
nRBC: 0 % (ref 0.0–0.2)

## 2022-07-10 LAB — COMPREHENSIVE METABOLIC PANEL
ALT: 18 U/L (ref 0–44)
AST: 21 U/L (ref 15–41)
Albumin: 4.6 g/dL (ref 3.5–5.0)
Alkaline Phosphatase: 56 U/L (ref 38–126)
Anion gap: 16 — ABNORMAL HIGH (ref 5–15)
BUN: 11 mg/dL (ref 6–20)
CO2: 20 mmol/L — ABNORMAL LOW (ref 22–32)
Calcium: 9.5 mg/dL (ref 8.9–10.3)
Chloride: 106 mmol/L (ref 98–111)
Creatinine, Ser: 0.91 mg/dL (ref 0.61–1.24)
GFR, Estimated: >60 mL/min (ref >60)
Glucose, Bld: 109 mg/dL — ABNORMAL HIGH (ref 70–99)
Potassium: 3.7 mmol/L (ref 3.5–5.1)
Sodium: 142 mmol/L (ref 135–145)
Total Bilirubin: 0.3 mg/dL (ref 0.3–1.2)
Total Protein: 8.2 g/dL — ABNORMAL HIGH (ref 6.5–8.1)

## 2022-07-10 LAB — URINALYSIS, ROUTINE W REFLEX MICROSCOPIC
Bilirubin Urine: NEGATIVE
Glucose, UA: NEGATIVE mg/dL
Hgb urine dipstick: NEGATIVE
Ketones, ur: NEGATIVE mg/dL
Leukocytes,Ua: NEGATIVE
Nitrite: NEGATIVE
Protein, ur: NEGATIVE mg/dL
Specific Gravity, Urine: 1.006 (ref 1.005–1.030)
pH: 5 (ref 5.0–8.0)

## 2022-07-10 LAB — RAPID URINE DRUG SCREEN, HOSP PERFORMED
Amphetamines: NOT DETECTED
Barbiturates: NOT DETECTED
Benzodiazepines: NOT DETECTED
Cocaine: POSITIVE — AB
Opiates: NOT DETECTED
Tetrahydrocannabinol: NOT DETECTED

## 2022-07-10 LAB — ETHANOL: Alcohol, Ethyl (B): 397 mg/dL (ref <10)
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
## 2022-07-10 NOTE — ED Notes (Signed)
Happy meal provideed
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
## 2022-07-11 ENCOUNTER — Encounter (HOSPITAL_COMMUNITY): Payer: Self-pay | Admitting: Registered Nurse

## 2022-07-11 DIAGNOSIS — F3130 Bipolar disorder, current episode depressed, mild or moderate severity, unspecified: Secondary | ICD-10-CM

## 2022-07-11 DIAGNOSIS — F10920 Alcohol use, unspecified with intoxication, uncomplicated: Secondary | ICD-10-CM

## 2022-07-11 DIAGNOSIS — F1999 Other psychoactive substance use, unspecified with unspecified psychoactive substance-induced disorder: Secondary | ICD-10-CM

## 2022-07-11 DIAGNOSIS — F1020 Alcohol dependence, uncomplicated: Secondary | ICD-10-CM

## 2022-07-11 DIAGNOSIS — F1410 Cocaine abuse, uncomplicated: Secondary | ICD-10-CM

## 2022-07-11 MED ORDER — LORAZEPAM 2 MG/ML IJ SOLN: 1.0000 mg | INTRAMUSCULAR | Status: DC | PRN

## 2022-07-11 MED ORDER — CARBAMAZEPINE 200 MG PO TABS
200.0000 mg | ORAL_TABLET | Freq: Every day | ORAL | 0 refills | Status: DC
Start: 2022-07-11 — End: 2022-08-25

## 2022-07-11 MED ORDER — LORAZEPAM 1 MG PO TABS
1.0000 mg | ORAL_TABLET | ORAL | Status: DC | PRN
  Administered 2022-07-11: 2 mg via ORAL
  Filled 2022-07-11: qty 2

## 2022-07-11 MED ORDER — FOLIC ACID 1 MG PO TABS
1.0000 mg | ORAL_TABLET | Freq: Every day | ORAL | Status: DC
  Administered 2022-07-11: 1 mg via ORAL
  Filled 2022-07-11: qty 1

## 2022-07-11 MED ORDER — DULOXETINE HCL 30 MG PO CPEP: 60.0000 mg | ORAL_CAPSULE | Freq: Every day | ORAL | Status: DC

## 2022-07-11 MED ORDER — THIAMINE HCL 100 MG PO TABS
100.0000 mg | ORAL_TABLET | Freq: Every day | ORAL | Status: DC
  Administered 2022-07-11: 100 mg via ORAL
  Filled 2022-07-11: qty 1

## 2022-07-11 MED ORDER — THIAMINE HCL 100 MG/ML IJ SOLN: 100.0000 mg | Freq: Every day | INTRAMUSCULAR | Status: DC

## 2022-07-11 MED ORDER — CARBAMAZEPINE 200 MG PO TABS
200.0000 mg | ORAL_TABLET | Freq: Every day | ORAL | Status: DC
  Filled 2022-07-11: qty 1

## 2022-07-11 MED ORDER — GABAPENTIN 300 MG PO CAPS
300.0000 mg | ORAL_CAPSULE | Freq: Three times a day (TID) | ORAL | 0 refills | Status: DC
Start: 2022-07-11 — End: 2022-08-28

## 2022-07-11 MED ORDER — GABAPENTIN 300 MG PO CAPS: 300.0000 mg | ORAL_CAPSULE | Freq: Three times a day (TID) | ORAL | Status: DC

## 2022-07-11 MED ORDER — ADULT MULTIVITAMIN W/MINERALS CH
1.0000 | ORAL_TABLET | Freq: Every day | ORAL | Status: DC
  Administered 2022-07-11: 1 via ORAL
  Filled 2022-07-11: qty 1

## 2022-07-11 MED ORDER — DULOXETINE HCL 60 MG PO CPEP: 60.0000 mg | ORAL_CAPSULE | Freq: Every day | ORAL | 0 refills | Status: DC

## 2022-07-11 NOTE — ED Provider Notes (Signed)
Emergency Medicine Observation Re-evaluation Note  Melroy Bougher is a 31 y.o. male, seen on rounds today.  Pt initially presented to the ED for complaints of Alcohol Intoxication Currently, the patient reports feeling improved. No thoughts of harm to self or others. No new physical c/o.   Physical Exam  BP 112/82   Pulse 68   Temp 98.3 F (36.8 C) (Oral)   Resp 20   SpO2 98%  Physical Exam General: alert, content, nad.  Cardiac: regular rate. Lungs: breathing comfortably. Psych: normal mood and affect. Pt does not appear acutely depressed or despondent. No thoughts of harm to self or others. Pt is not responding to internal stimuli - no acute psychosis noted.   ED Course / MDM    I have reviewed the labs performed to date as well as medications administered while in observation.  Recent changes in the last 24 hours include ED obs, metabolism of substances, and reassessment.   Plan  Pt with normal vitals signs. No acute c/o today, feels improved, and ready for d/c.   Normal mood/affect. No acute psychosis.  No tremor or shakes. No sweats.   Pt currently appears stable for d/c.  Return precautions provided.      Cathren Laine, MD 07/11/22 1355

## 2022-07-11 NOTE — Progress Notes (Signed)
Substance abuse resources added to patients AVS. Patient is pending TTS eval.

## 2022-07-11 NOTE — Discharge Instructions (Addendum)
It was our pleasure to provide your ER care today - we hope that you feel better.  Avoid alcohol and/or drug use, as it is harmful to your physical health and mental well-being. See resource guide for treatment programs in the area.   Also follow up closely with primary care doctor and mental health specialist I the next 1-2 weeks.   For mental health issues and/or crisis, you may also go directly to the [facility] - it is open 24/7 and walk-ins are welcome.   Return to ER if worse, new symptoms, fevers, chest pain, trouble breathing, or other emergency concern.     Outpatient psychiatric Services  Walk in hours for medication management Monday, Wednesday, Thursday, and Friday from 8:00 AM to 11:00 AM Recommend arriving by by 7:30 AM.  It is first come first serve.    Walk in hours for therapy intake Monday and Wednesday only 8:00 AM to 11:00 AM Encouraged to arrive by 7:30 AM.  It is first come first serve   Inpatient patient psychiatric services [facility] offers comprehensive behavioral heath care services for mental health and substance abuse treatment.  Social work can also assist with referral to or getting you into a rehabilitation program short or long term

## 2022-07-11 NOTE — BH Assessment (Signed)
Per Dr. Kumar in the 9am morning huddle/bed meeting, Provider to assess. No TTS CCA is needed at this time. 

## 2022-07-11 NOTE — Consult Note (Signed)
[facility] Discharge  07/11/2022 2:18 PM John Vaughan  MRN:  [id number]  Principal Problem: Substance-induced disorder [facility](HCC) Discharge Diagnoses: Principal Problem:   Substance-induced disorder (HCC) Active Problems:   GAD (generalized anxiety disorder)   Alcohol use disorder, severe, dependence (HCC)   Cocaine use disorder (HCC)   Bipolar I disorder, most recent episode depressed (HCC)  Clinical Impression:  Final diagnoses:  Alcoholic intoxication without complication (HCC)  Cocaine abuse (HCC)  Substance induced mood disorder (HCC)   Subjective: [facility] after presenting with complaints of alcohol intoxication, passive suicidal ideation, and mother reporting that she felt that patient was a danger to family in his current state  John DinningAaron Bradley Vaughan, 31 y.o., male patient face to face by this provider, consulted with Dr. Nelly RoutArchana Kumar; and chart reviewed on 07/11/22.  On evaluation John Vaughan reports he was intoxicated yesterday and having passive suicidal thoughts "My body is in a lot of pain.  I start drinking to cover it up and I get depressed while drinking and start having suicidal thoughts."  Patient states there was never any intent or plan.  Reports he was [facility] and has ran out of his medications.  States he has had no medications for the last "about a week"  States that he missed his appointment for outpatient psychiatric services [facility].  States he hasn't had a seizure in over a month.  At this time patient states he is feeling better and is no longer having any suicidal thoughts.  States that he wants to get back on his medications and outpatient psychiatric services.  He also denies self-harm/homicidal ideation, psychosis, and paranoia.  States that he lives with his mother, aunt and her boyfriend.  Reports he is currently unemployed and not seeking employment related to  "Them all telling me the same thing.  That my medical health is a liability."  States he is currently applying for disability.  Patient gave permission to speak to his mother for collateral information but when attempted to contact there was no answer.  During evaluation John Vaughan is lying in bed with no noted distress.  He is alert/oriented x 4. He is calm and cooperative.  His mood is euthymic with congruent affect.  He is speaking in a clear tone at moderate volume, and normal pace; with good eye contact.  His thought process is coherent and relevant; There is no indication that he is currently responding to internal/external stimuli or experiencing delusional thought content; and he has denied suicidal/self-harm/homicidal ideation, psychosis, and paranoia.  Patient has remained calm throughout assessment and has answered questions appropriately.     Disposition:  Psychiatrically cleared No evidence of imminent risk to self or others at present.   Patient does not meet criteria for psychiatric inpatient admission. Supportive therapy provided about ongoing stressors. Discussed crisis plan, support from social network, calling 911, coming to the Emergency Department, and calling Suicide Hotline.     ED Assessment Time Calculation: Start Time: 1000 Stop Time: 1030 Total Time in Minutes (Assessment Completion): 30   Past Psychiatric History: See below  Past Medical History:  Past Medical History:  Diagnosis Date   Alcohol abuse    Anxiety    Bipolar 2 disorder (HCC)    Depression     Past Surgical History:  Procedure Laterality Date   HAND SURGERY Right    Family History:  Family History  Problem  Relation Age of Onset   Anxiety disorder Mother    Alcohol abuse Father    Anxiety disorder Maternal Aunt    Anxiety disorder Maternal Grandmother    Alcohol abuse Paternal Grandfather    Family Psychiatric  History: See above Social History:  Social History   Substance  and Sexual Activity  Alcohol Use Yes   Alcohol/week: 2.0 standard drinks of alcohol   Types: 2 Cans of beer per week   Comment: 48 beers a week     Social History   Substance and Sexual Activity  Drug Use Yes   Types: Marijuana, Cocaine, "Crack" cocaine   Comment: cocaine in last month    Social History   Socioeconomic History   Marital status: Single    Spouse name: Not on file   Number of children: Not on file   Years of education: Not on file   Highest education level: Not on file  Occupational History   Not on file  Tobacco Use   Smoking status: Every Day    Packs/day: 1.50    Types: Cigarettes   Smokeless tobacco: Former    Types: Snuff  Vaping Use   Vaping Use: Never used  Substance and Sexual Activity   Alcohol use: Yes    Alcohol/week: 2.0 standard drinks of alcohol    Types: 2 Cans of beer per week    Comment: 48 beers a week   Drug use: Yes    Types: Marijuana, Cocaine, "Crack" cocaine    Comment: cocaine in last month   Sexual activity: Yes  Other Topics Concern   Not on file  Social History Narrative   Not on file   Social Determinants of Health   Financial Resource Strain: Not on file  Food Insecurity: Not on file  Transportation Needs: Not on file  Physical Activity: Not on file  Stress: Not on file  Social Connections: Not on file    Tobacco Cessation:  A prescription for an FDA-approved tobacco cessation medication was offered at discharge and the patient refused  Current Medications: Current Facility-Administered Medications  Medication Dose Route Frequency Provider Last Rate Last Admin   carbamazepine (TEGRETOL) tablet 200 mg  200 mg Oral Daily Sherril Shipman B, NP       DULoxetine (CYMBALTA) DR capsule 60 mg  60 mg Oral Daily Sashia Campas B, NP       folic acid (FOLVITE) tablet 1 mg  1 mg Oral Daily Pollina, Canary Brim, MD   1 mg at 07/11/22 0932   gabapentin (NEURONTIN) capsule 300 mg  300 mg Oral TID Eluzer Howdeshell B, NP        LORazepam (ATIVAN) tablet 1-4 mg  1-4 mg Oral Q1H PRN Gilda Crease, MD   2 mg at 07/11/22 7829   Or   LORazepam (ATIVAN) injection 1-4 mg  1-4 mg Intravenous Q1H PRN Pollina, Canary Brim, MD       multivitamin with minerals tablet 1 tablet  1 tablet Oral Daily Pollina, Canary Brim, MD   1 tablet at 07/11/22 0932   thiamine tablet 100 mg  100 mg Oral Daily Gilda Crease, MD   100 mg at 07/11/22 0932   Or   thiamine (B-1) injection 100 mg  100 mg Intravenous Daily Pollina, Canary Brim, MD       Current Outpatient Medications  Medication Sig Dispense Refill   Multiple Vitamin (MULTIVITAMIN WITH MINERALS) TABS tablet Take 1 tablet by mouth daily. 30 tablet 0  pantoprazole (PROTONIX) 40 MG tablet Take 1 tablet (40 mg total) by mouth daily. 30 tablet 0   thiamine 100 MG tablet Take 1 tablet (100 mg total) by mouth daily with supper. 30 tablet 0   traZODone (DESYREL) 100 MG tablet Take 1 tablet (100 mg total) by mouth at bedtime as needed for sleep. 30 tablet 0   carbamazepine (TEGRETOL) 200 MG tablet Take 1 tablet (200 mg total) by mouth daily. 15 tablet 0   DULoxetine (CYMBALTA) 60 MG capsule Take 1 capsule (60 mg total) by mouth daily. 15 capsule 0   gabapentin (NEURONTIN) 300 MG capsule Take 1 capsule (300 mg total) by mouth 3 (three) times daily. 30 capsule 0   hydrOXYzine (ATARAX) 25 MG tablet Take 1 tablet (25 mg total) by mouth every 6 (six) hours as needed for anxiety. 30 tablet 0   nicotine (NICODERM CQ - DOSED IN MG/24 HOURS) 14 mg/24hr patch Place 1 patch (14 mg total) onto the skin daily as needed (smoking cessation). (Patient not taking: Reported on 07/11/2022) 28 patch 0   PTA Medications: (Not in a hospital admission)   [place] Scale:  Flowsheet Row ED from 07/10/2022 in [facility] EMERGENCY DEPARTMENT Ad[facility] (Dis[facility]) [facility]12/[facility]22 in [facility]  C-SSRS RISK CATEGORY No Risk No Risk High Risk       Musculoskeletal: Strength & Muscle Tone: within normal limits Gait & Station: normal Patient leans: N/A  Psychiatric Specialty Exam: Presentation  General Appearance: Appropriate for Environment  Eye Contact:Good  Speech:Clear and Coherent; Normal Rate  Speech Volume:Normal  Handedness:Right   Mood and Affect  Mood:Euthymic  Affect:Appropriate; Congruent   Thought Process  Thought Processes:Coherent; Goal Directed  Descriptions of Associations:Intact  Orientation:Full (Time, Place and Person)  Thought Content:Logical  History of Schizophrenia/Schizoaffective disorder:No  Duration of Psychotic Symptoms:N/A  Hallucinations:Hallucinations: None  Ideas of Reference:None  Suicidal Thoughts:Suicidal Thoughts: No  Homicidal Thoughts:Homicidal Thoughts: No   Sensorium  Memory:Immediate Good; Recent Good; Remote Good  Judgment:Intact  Insight:Present   Executive Functions  Concentration:Good  Attention Span:Good  Recall:Good  Fund of Knowledge:Good  Language:Good   Psychomotor Activity  Psychomotor Activity:Psychomotor Activity: Normal   Assets  Assets:Communication Skills; Desire for Improvement; Housing; Social Support   Sleep  Sleep:Sleep: Good    Physical Exam: Physical Exam Vitals and nursing note reviewed. Exam conducted with a chaperone present.  Constitutional:      General: He is not in acute distress.    Appearance: Normal appearance. He is not ill-appearing.  Cardiovascular:     Rate and Rhythm: Normal rate.  Pulmonary:     Effort: Pulmonary effort is normal.  Neurological:     Mental Status: He is alert and oriented to person, place, and time.  Psychiatric:        Attention and Perception: Attention and perception normal. He does not perceive auditory or visual hallucinations.        Mood and Affect: Mood and affect normal.        Speech: Speech  normal.        Behavior: Behavior normal. Behavior is cooperative.        Thought Content: Thought content normal. Thought content is not paranoid or delusional. Thought content does not include homicidal or suicidal ideation.        Cognition and Memory: Cognition normal.        Judgment: Judgment is impulsive.  Review of Systems  Constitutional: Negative.   HENT: Negative.    Eyes: Negative.   Respiratory: Negative.    Cardiovascular: Negative.   Gastrointestinal: Negative.   Genitourinary: Negative.   Musculoskeletal: Negative.   Skin: Negative.   Neurological:  Positive for seizures (states it has been over a month since his last seizure.  "They say it's alcohol withdrawal seizures but I'd had a seizure before that even happened"). Negative for tremors and headaches.  Endo/Heme/Allergies: Negative.   Psychiatric/Behavioral:  Positive for substance abuse (Alcohol and cocaine). Depression: Stable. Hallucinations: denies. Suicidal ideas: Denies at this time.The patient does not have insomnia. Nervous/anxious: Stable.   Blood pressure 121/80, pulse 77, temperature 98.3 F (36.8 C), temperature source Oral, resp. rate 16, SpO2 98 %. There is no height or weight on file to calculate BMI.   Demographic Factors:  Male, Caucasian, and Unemployed  Loss Factors: NA  Historical Factors: Impulsivity  Risk Reduction Factors:   Sense of responsibility to family, Religious beliefs about death, Living with another person, especially a relative, and Positive social support  Continued Clinical Symptoms:  Alcohol/Substance Abuse/Dependencies  Cognitive Features That Contribute To Risk:  None    Suicide Risk:  Minimal: No identifiable suicidal ideation.  Patients presenting with no risk factors but with morbid ruminations; may be classified as minimal risk based on the severity of the depressive symptoms   Follow-up Information     [facility] .    Specialty: Urgent Care Contact information: [address] [place] [address] [phone]        Services, [facility] .   Specialty: [facility] information: [address] [place] [address] [phone]         [facility] Follow up.   Why: Call set appointment for primary care Contact information: [address] [employer] [address] [place] [address] [phone]                Plan Of Care/Follow-up recommendations:   Follow-up Information     [facility] .   Specialty: Urgent Care Contact information: [address] [place] [address] [phone]        Services, [facility] .   Specialty: [facility] information: [address] [place] [address] [phone]         [facility] Follow up.   Why: Call set appointment for primary care Contact information: [address] [place] [address] [phone]                 Discharge Instructions      It was our pleasure to provide your ER care today - we hope that you feel better.  Avoid alcohol and/or drug use, as it is harmful to your physical health and mental well-being. See resource guide for treatment programs in the area.   Also follow up closely with primary care doctor and mental health specialist I the next 1-2 weeks.   For mental health issues and/or crisis, you may also go directly to the [facility] - it is open 24/7 and walk-ins are welcome.   Return to ER if worse, new symptoms, fevers, chest pain, trouble breathing, or other emergency concern.     Outpatient psychiatric Services  Walk in hours for medication management Monday, Wednesday, Thursday, and Friday  from 8:00 AM to 11:00 AM Recommend arriving by by 7:30  AM.  It is first come first serve.    Walk in hours for therapy intake Monday and Wednesday only 8:00 AM to 11:00 AM Encouraged to arrive by 7:30 AM.  It is first come first serve   Inpatient patient psychiatric services The [facility] offers comprehensive behavioral heath care services for mental health and substance abuse treatment.  Social work can also assist with referral to or getting you into a rehabilitation program short or long term         Disposition: Discharge  Johnella Crumm, NP 07/11/2022, 2:18 PM

## 2022-08-25 ENCOUNTER — Inpatient Hospital Stay (HOSPITAL_COMMUNITY): Payer: Self-pay

## 2022-08-25 ENCOUNTER — Other Ambulatory Visit: Payer: Self-pay

## 2022-08-25 ENCOUNTER — Emergency Department (HOSPITAL_COMMUNITY): Payer: Self-pay

## 2022-08-25 ENCOUNTER — Inpatient Hospital Stay (HOSPITAL_COMMUNITY)
Admission: EM | Admit: 2022-08-25 | Discharge: 2022-08-28 | DRG: 897 | Disposition: A | Discharge status: Home / Self Care | Payer: Self-pay | Attending: Internal Medicine | Admitting: Internal Medicine

## 2022-08-25 ENCOUNTER — Encounter (HOSPITAL_COMMUNITY): Payer: Self-pay

## 2022-08-25 ENCOUNTER — Ambulatory Visit (HOSPITAL_COMMUNITY)
Admission: EM | Admit: 2022-08-25 | Discharge: 2022-08-25 | Disposition: A | Discharge status: Other Acute Inpatient Hospital | Payer: No Payment, Other | Attending: Psychiatry | Admitting: Psychiatry

## 2022-08-25 DIAGNOSIS — R0789 Other chest pain: Secondary | ICD-10-CM | POA: Diagnosis present

## 2022-08-25 DIAGNOSIS — F10232 Alcohol dependence with withdrawal with perceptual disturbance: Principal | ICD-10-CM | POA: Diagnosis present

## 2022-08-25 DIAGNOSIS — M25551 Pain in right hip: Secondary | ICD-10-CM | POA: Diagnosis present

## 2022-08-25 DIAGNOSIS — H532 Diplopia: Secondary | ICD-10-CM | POA: Diagnosis present

## 2022-08-25 DIAGNOSIS — R260 Ataxic gait: Secondary | ICD-10-CM | POA: Diagnosis present

## 2022-08-25 DIAGNOSIS — R45851 Suicidal ideations: Secondary | ICD-10-CM | POA: Diagnosis present

## 2022-08-25 DIAGNOSIS — F1410 Cocaine abuse, uncomplicated: Secondary | ICD-10-CM | POA: Diagnosis present

## 2022-08-25 DIAGNOSIS — F1490 Cocaine use, unspecified, uncomplicated: Secondary | ICD-10-CM

## 2022-08-25 DIAGNOSIS — F411 Generalized anxiety disorder: Secondary | ICD-10-CM | POA: Diagnosis present

## 2022-08-25 DIAGNOSIS — H5500 Unspecified nystagmus: Secondary | ICD-10-CM | POA: Diagnosis present

## 2022-08-25 DIAGNOSIS — Z818 Family history of other mental and behavioral disorders: Secondary | ICD-10-CM

## 2022-08-25 DIAGNOSIS — R270 Ataxia, unspecified: Secondary | ICD-10-CM

## 2022-08-25 DIAGNOSIS — F1510 Other stimulant abuse, uncomplicated: Secondary | ICD-10-CM | POA: Diagnosis present

## 2022-08-25 DIAGNOSIS — Z87898 Personal history of other specified conditions: Secondary | ICD-10-CM

## 2022-08-25 DIAGNOSIS — F1020 Alcohol dependence, uncomplicated: Secondary | ICD-10-CM | POA: Diagnosis present

## 2022-08-25 DIAGNOSIS — G40909 Epilepsy, unspecified, not intractable, without status epilepticus: Secondary | ICD-10-CM | POA: Insufficient documentation

## 2022-08-25 DIAGNOSIS — G629 Polyneuropathy, unspecified: Secondary | ICD-10-CM | POA: Diagnosis present

## 2022-08-25 DIAGNOSIS — F410 Panic disorder [episodic paroxysmal anxiety] without agoraphobia: Secondary | ICD-10-CM | POA: Diagnosis present

## 2022-08-25 DIAGNOSIS — F332 Major depressive disorder, recurrent severe without psychotic features: Secondary | ICD-10-CM | POA: Diagnosis present

## 2022-08-25 DIAGNOSIS — F17210 Nicotine dependence, cigarettes, uncomplicated: Secondary | ICD-10-CM | POA: Diagnosis present

## 2022-08-25 DIAGNOSIS — Z9151 Personal history of suicidal behavior: Secondary | ICD-10-CM

## 2022-08-25 DIAGNOSIS — G319 Degenerative disease of nervous system, unspecified: Secondary | ICD-10-CM | POA: Diagnosis present

## 2022-08-25 DIAGNOSIS — R413 Other amnesia: Secondary | ICD-10-CM | POA: Diagnosis present

## 2022-08-25 DIAGNOSIS — Z20822 Contact with and (suspected) exposure to covid-19: Secondary | ICD-10-CM | POA: Diagnosis present

## 2022-08-25 DIAGNOSIS — F10932 Alcohol use, unspecified with withdrawal with perceptual disturbance: Principal | ICD-10-CM

## 2022-08-25 DIAGNOSIS — R509 Fever, unspecified: Secondary | ICD-10-CM | POA: Diagnosis present

## 2022-08-25 DIAGNOSIS — K5900 Constipation, unspecified: Secondary | ICD-10-CM | POA: Diagnosis present

## 2022-08-25 DIAGNOSIS — Y908 Blood alcohol level of 240 mg/100 ml or more: Secondary | ICD-10-CM | POA: Diagnosis present

## 2022-08-25 DIAGNOSIS — F909 Attention-deficit hyperactivity disorder, unspecified type: Secondary | ICD-10-CM | POA: Diagnosis present

## 2022-08-25 DIAGNOSIS — E512 Wernicke's encephalopathy: Secondary | ICD-10-CM | POA: Diagnosis present

## 2022-08-25 DIAGNOSIS — Z91148 Patient's other noncompliance with medication regimen for other reason: Secondary | ICD-10-CM

## 2022-08-25 DIAGNOSIS — F10129 Alcohol abuse with intoxication, unspecified: Secondary | ICD-10-CM | POA: Insufficient documentation

## 2022-08-25 DIAGNOSIS — F32A Depression, unspecified: Secondary | ICD-10-CM | POA: Insufficient documentation

## 2022-08-25 DIAGNOSIS — G8929 Other chronic pain: Secondary | ICD-10-CM | POA: Diagnosis present

## 2022-08-25 DIAGNOSIS — Z811 Family history of alcohol abuse and dependence: Secondary | ICD-10-CM

## 2022-08-25 DIAGNOSIS — M545 Low back pain, unspecified: Secondary | ICD-10-CM | POA: Diagnosis present

## 2022-08-25 DIAGNOSIS — Z79899 Other long term (current) drug therapy: Secondary | ICD-10-CM

## 2022-08-25 DIAGNOSIS — F10939 Alcohol use, unspecified with withdrawal, unspecified: Secondary | ICD-10-CM | POA: Diagnosis present

## 2022-08-25 HISTORY — DX: Unspecified convulsions: R56.9

## 2022-08-25 LAB — COMPREHENSIVE METABOLIC PANEL
ALT: 38 U/L (ref 0–44)
AST: 43 U/L — ABNORMAL HIGH (ref 15–41)
Albumin: 4.3 g/dL (ref 3.5–5.0)
Alkaline Phosphatase: 60 U/L (ref 38–126)
Anion gap: 17 — ABNORMAL HIGH (ref 5–15)
BUN: 6 mg/dL (ref 6–20)
CO2: 20 mmol/L — ABNORMAL LOW (ref 22–32)
Calcium: 9.3 mg/dL (ref 8.9–10.3)
Chloride: 105 mmol/L (ref 98–111)
Creatinine, Ser: 0.91 mg/dL (ref 0.61–1.24)
GFR, Estimated: >60 mL/min (ref >60)
Glucose, Bld: 101 mg/dL — ABNORMAL HIGH (ref 70–99)
Potassium: 4 mmol/L (ref 3.5–5.1)
Sodium: 142 mmol/L (ref 135–145)
Total Bilirubin: 0.4 mg/dL (ref 0.3–1.2)
Total Protein: 7.5 g/dL (ref 6.5–8.1)

## 2022-08-25 LAB — CBC
HCT: 44.5 % (ref 39.0–52.0)
Hemoglobin: 15.4 g/dL (ref 13.0–17.0)
MCH: 33.1 pg (ref 26.0–34.0)
MCHC: 34.6 g/dL (ref 30.0–36.0)
MCV: 95.7 fL (ref 80.0–100.0)
Platelets: 244 10*3/uL (ref 150–400)
RBC: 4.65 MIL/uL (ref 4.22–5.81)
RDW: 12.1 % (ref 11.5–15.5)
WBC: 4.7 10*3/uL (ref 4.0–10.5)
nRBC: 0 % (ref 0.0–0.2)

## 2022-08-25 LAB — CBG MONITORING, ED: Glucose-Capillary: 115 mg/dL — ABNORMAL HIGH (ref 70–99)

## 2022-08-25 LAB — RAPID URINE DRUG SCREEN, HOSP PERFORMED
Amphetamines: NOT DETECTED
Barbiturates: NOT DETECTED
Benzodiazepines: NOT DETECTED
Cocaine: POSITIVE — AB
Opiates: NOT DETECTED
Tetrahydrocannabinol: NOT DETECTED

## 2022-08-25 LAB — FOLATE: Folate: 31.9 ng/mL (ref >5.9)

## 2022-08-25 LAB — VITAMIN B12: Vitamin B-12: 255 pg/mL (ref 180–914)

## 2022-08-25 LAB — RESP PANEL BY RT-PCR (FLU A&B, COVID) ARPGX2
Influenza A by PCR: NEGATIVE
Influenza B by PCR: NEGATIVE
SARS Coronavirus 2 by RT PCR: NEGATIVE

## 2022-08-25 LAB — TROPONIN I (HIGH SENSITIVITY)
Troponin I (High Sensitivity): 3 ng/L (ref <18)
Troponin I (High Sensitivity): 2 ng/L (ref <18)

## 2022-08-25 LAB — CK: Total CK: 263 U/L (ref 49–397)

## 2022-08-25 LAB — MAGNESIUM: Magnesium: 2.3 mg/dL (ref 1.7–2.4)

## 2022-08-25 LAB — ETHANOL: Alcohol, Ethyl (B): 249 mg/dL — ABNORMAL HIGH (ref <10)

## 2022-08-25 LAB — LIPASE, BLOOD: Lipase: 43 U/L (ref 11–51)

## 2022-08-25 MED ORDER — DULOXETINE HCL 30 MG PO CPEP
60.0000 mg | ORAL_CAPSULE | Freq: Every day | ORAL | Status: DC
  Administered 2022-08-25 – 2022-08-28 (×4): 60 mg via ORAL
  Filled 2022-08-25 (×4): qty 2

## 2022-08-25 MED ORDER — HYDROXYZINE HCL 25 MG PO TABS
25.0000 mg | ORAL_TABLET | Freq: Four times a day (QID) | ORAL | Status: DC | PRN
  Administered 2022-08-26: 25 mg via ORAL
  Filled 2022-08-25: qty 1

## 2022-08-25 MED ORDER — THIAMINE MONONITRATE 100 MG PO TABS
100.0000 mg | ORAL_TABLET | Freq: Every day | ORAL | Status: DC
Start: 2022-08-25 — End: 2022-08-26
  Administered 2022-08-25 – 2022-08-26 (×2): 100 mg via ORAL
  Filled 2022-08-25 (×2): qty 1

## 2022-08-25 MED ORDER — SENNOSIDES-DOCUSATE SODIUM 8.6-50 MG PO TABS: 1.0000 | ORAL_TABLET | Freq: Every evening | ORAL | Status: DC | PRN

## 2022-08-25 MED ORDER — PANTOPRAZOLE SODIUM 40 MG PO TBEC
40.0000 mg | DELAYED_RELEASE_TABLET | Freq: Every day | ORAL | Status: DC
  Administered 2022-08-25 – 2022-08-28 (×4): 40 mg via ORAL
  Filled 2022-08-25 (×4): qty 1

## 2022-08-25 MED ORDER — ALBUTEROL SULFATE HFA 108 (90 BASE) MCG/ACT IN AERS
1.0000 | INHALATION_SPRAY | Freq: Once | RESPIRATORY_TRACT | Status: AC
  Administered 2022-08-25: 2 via RESPIRATORY_TRACT
  Filled 2022-08-25: qty 6.7

## 2022-08-25 MED ORDER — LORAZEPAM 1 MG PO TABS
1.0000 mg | ORAL_TABLET | Freq: Once | ORAL | Status: AC
  Administered 2022-08-25: 1 mg via ORAL
  Filled 2022-08-25: qty 1

## 2022-08-25 MED ORDER — ACETAMINOPHEN 325 MG PO TABS: 650.0000 mg | ORAL_TABLET | Freq: Four times a day (QID) | ORAL | Status: DC | PRN

## 2022-08-25 MED ORDER — LORAZEPAM 1 MG PO TABS: 1.0000 mg | ORAL_TABLET | ORAL | Status: DC | PRN

## 2022-08-25 MED ORDER — LEVETIRACETAM 500 MG PO TABS
500.0000 mg | ORAL_TABLET | Freq: Two times a day (BID) | ORAL | Status: DC
  Administered 2022-08-25 – 2022-08-28 (×6): 500 mg via ORAL
  Filled 2022-08-25 (×7): qty 1

## 2022-08-25 MED ORDER — ADULT MULTIVITAMIN W/MINERALS CH
1.0000 | ORAL_TABLET | Freq: Every day | ORAL | Status: DC
  Administered 2022-08-25 – 2022-08-28 (×4): 1 via ORAL
  Filled 2022-08-25 (×4): qty 1

## 2022-08-25 MED ORDER — ACETAMINOPHEN 650 MG RE SUPP: 650.0000 mg | Freq: Four times a day (QID) | RECTAL | Status: DC | PRN

## 2022-08-25 MED ORDER — LOPERAMIDE HCL 2 MG PO CAPS: 2.0000 mg | ORAL_CAPSULE | ORAL | Status: AC | PRN

## 2022-08-25 MED ORDER — ALBUTEROL SULFATE (2.5 MG/3ML) 0.083% IN NEBU
2.5000 mg | INHALATION_SOLUTION | Freq: Four times a day (QID) | RESPIRATORY_TRACT | Status: DC | PRN
Start: 2022-08-25 — End: 2022-08-29

## 2022-08-25 MED ORDER — LORAZEPAM 2 MG/ML IJ SOLN: 1.0000 mg | INTRAMUSCULAR | Status: DC | PRN

## 2022-08-25 MED ORDER — CHLORDIAZEPOXIDE HCL 5 MG PO CAPS: 25.0000 mg | ORAL_CAPSULE | Freq: Every day | ORAL | Status: DC

## 2022-08-25 MED ORDER — ONDANSETRON HCL 4 MG/2ML IJ SOLN: 4.0000 mg | Freq: Four times a day (QID) | INTRAMUSCULAR | Status: DC | PRN

## 2022-08-25 MED ORDER — FOLIC ACID 1 MG PO TABS
1.0000 mg | ORAL_TABLET | Freq: Every day | ORAL | Status: DC
  Administered 2022-08-25 – 2022-08-28 (×4): 1 mg via ORAL
  Filled 2022-08-25 (×4): qty 1

## 2022-08-25 MED ORDER — CHLORDIAZEPOXIDE HCL 5 MG PO CAPS
25.0000 mg | ORAL_CAPSULE | Freq: Three times a day (TID) | ORAL | Status: AC
  Administered 2022-08-26 – 2022-08-27 (×3): 25 mg via ORAL
  Filled 2022-08-25 (×3): qty 5

## 2022-08-25 MED ORDER — LORAZEPAM 2 MG/ML IJ SOLN
1.0000 mg | INTRAMUSCULAR | Status: DC | PRN
  Administered 2022-08-25: 4 mg via INTRAVENOUS
  Administered 2022-08-26: 2 mg via INTRAVENOUS
  Filled 2022-08-25: qty 1
  Filled 2022-08-25: qty 2

## 2022-08-25 MED ORDER — GABAPENTIN 300 MG PO CAPS
300.0000 mg | ORAL_CAPSULE | Freq: Three times a day (TID) | ORAL | Status: DC
  Administered 2022-08-25 – 2022-08-28 (×9): 300 mg via ORAL
  Filled 2022-08-25 (×9): qty 1

## 2022-08-25 MED ORDER — THIAMINE HCL 100 MG/ML IJ SOLN: 100.0000 mg | Freq: Every day | INTRAMUSCULAR | Status: DC

## 2022-08-25 MED ORDER — ONDANSETRON HCL 4 MG PO TABS: 4.0000 mg | ORAL_TABLET | Freq: Four times a day (QID) | ORAL | Status: DC | PRN

## 2022-08-25 MED ORDER — LORAZEPAM 1 MG PO TABS
1.0000 mg | ORAL_TABLET | ORAL | Status: DC | PRN
  Administered 2022-08-25: 1 mg via ORAL
  Filled 2022-08-25: qty 1

## 2022-08-25 MED ORDER — CHLORDIAZEPOXIDE HCL 5 MG PO CAPS
25.0000 mg | ORAL_CAPSULE | Freq: Four times a day (QID) | ORAL | Status: AC
  Administered 2022-08-25 – 2022-08-26 (×3): 25 mg via ORAL
  Filled 2022-08-25 (×2): qty 5
  Filled 2022-08-25: qty 1

## 2022-08-25 MED ORDER — TRAZODONE HCL 50 MG PO TABS
100.0000 mg | ORAL_TABLET | Freq: Every evening | ORAL | Status: DC | PRN
Start: 2022-08-25 — End: 2022-08-29

## 2022-08-25 MED ORDER — CHLORDIAZEPOXIDE HCL 5 MG PO CAPS
25.0000 mg | ORAL_CAPSULE | ORAL | Status: DC
  Filled 2022-08-25: qty 5

## 2022-08-25 MED ORDER — ENOXAPARIN SODIUM 40 MG/0.4ML IJ SOSY
40.0000 mg | PREFILLED_SYRINGE | INTRAMUSCULAR | Status: DC
  Administered 2022-08-25 – 2022-08-27 (×3): 40 mg via SUBCUTANEOUS
  Filled 2022-08-25 (×3): qty 0.4

## 2022-08-25 NOTE — Consult Note (Addendum)
Neurology Consultation  Reason for Consult: seizures Referring Physician: Dr. Antony Contras  CC: alcohol detox  History is obtained from:patient   HPI: John Vaughan is a 31 y.o. male with past medical history of GAD, depression, seizures, bipolar 2, ETOH abuse-drinks 8 40oz of beer/day, cocaine abuse, current cigarette smoker who presents to [facility] ED from behavioral health urgent care for medical screening evaluation and consideration for admission in the setting of alcohol withdrawal.  The patient states that he is a history of alcohol withdrawal seizures and has had to be admitted for this in the past.  He was seen at the behavioral health urgent care walk-in requesting alcohol detoxification.  He states that he had a seizure 2 days ago with tongue biting and urinary incontinence.  It was unwitnessed. He takes tegretol and gabapentin for seizures, however has not taken any medications in many months due to inability to afford medications. He last did crack cocaine yesterday, his last drink was yesterday. He says he has seizures all the time  He states he does not see a neurologist that behavioral health manages his medications for seizures. ETOH level 249, UDS positive for cocaine Neurology consulted     ROS: Full ROS was performed and is negative except as noted in the HPI.   Past Medical History:  Diagnosis Date   Alcohol abuse    Anxiety    Bipolar 2 disorder (HCC)    Depression    Seizures (HCC)      Family History  Problem Relation Age of Onset   Anxiety disorder Mother    Alcohol abuse Father    Anxiety disorder Maternal Aunt    Anxiety disorder Maternal Grandmother    Alcohol abuse Paternal Grandfather      Social History:   reports that he has been smoking cigarettes. He has been smoking an average of 1.5 packs per day. He has quit using smokeless tobacco.  His smokeless tobacco use included snuff. He reports current alcohol use of about 2.0 standard drinks of alcohol  per week. He reports current drug use. Drugs: Marijuana, Cocaine, and "Crack" cocaine.  Medications  Current Facility-Administered Medications:    acetaminophen (TYLENOL) tablet 650 mg, 650 mg, Oral, Q6H PRN **OR** acetaminophen (TYLENOL) suppository 650 mg, 650 mg, Rectal, Q6H PRN, Kirke Corin, Flossie Buffy, MD   albuterol (PROVENTIL) (2.5 MG/3ML) 0.083% nebulizer solution 2.5 mg, 2.5 mg, Nebulization, Q6H PRN, Kirke Corin, Flossie Buffy, MD   chlordiazePOXIDE (LIBRIUM) capsule 25 mg, 25 mg, Oral, QID, 25 mg at 08/25/22 1610 **FOLLOWED BY** [START ON 08/26/2022] chlordiazePOXIDE (LIBRIUM) capsule 25 mg, 25 mg, Oral, TID **FOLLOWED BY** [START ON 08/27/2022] chlordiazePOXIDE (LIBRIUM) capsule 25 mg, 25 mg, Oral, BH-qamhs **FOLLOWED BY** [START ON 08/28/2022] chlordiazePOXIDE (LIBRIUM) capsule 25 mg, 25 mg, Oral, Daily, Amponsah, Prosper M, MD   DULoxetine (CYMBALTA) DR capsule 60 mg, 60 mg, Oral, Daily, Amponsah, Flossie Buffy, MD   enoxaparin (LOVENOX) injection 40 mg, 40 mg, Subcutaneous, Q24H, Amponsah, Flossie Buffy, MD   folic acid (FOLVITE) tablet 1 mg, 1 mg, Oral, Daily, Steffanie Rainwater, MD, 1 mg at 08/25/22 1119   hydrOXYzine (ATARAX) tablet 25 mg, 25 mg, Oral, Q6H PRN, Kirke Corin, Flossie Buffy, MD   loperamide (IMODIUM) capsule 2-4 mg, 2-4 mg, Oral, PRN, Kirke Corin, Flossie Buffy, MD   LORazepam (ATIVAN) tablet 1-4 mg, 1-4 mg, Oral, Q1H PRN **OR** LORazepam (ATIVAN) injection 1-4 mg, 1-4 mg, Intravenous, Q1H PRN, Steffanie Rainwater, MD   multivitamin with minerals tablet 1 tablet, 1 tablet, Oral,  Daily, Steffanie Rainwater, MD, 1 tablet at 08/25/22 1119   ondansetron (ZOFRAN) tablet 4 mg, 4 mg, Oral, Q6H PRN **OR** ondansetron (ZOFRAN) injection 4 mg, 4 mg, Intravenous, Q6H PRN, Kirke Corin, Flossie Buffy, MD   pantoprazole (PROTONIX) EC tablet 40 mg, 40 mg, Oral, Daily, Kirke Corin, Flossie Buffy, MD   senna-docusate (Senokot-S) tablet 1 tablet, 1 tablet, Oral, QHS PRN, Steffanie Rainwater, MD   thiamine (VITAMIN B1) tablet 100 mg,  100 mg, Oral, Daily, 100 mg at 08/25/22 1122 **OR** thiamine (VITAMIN B1) injection 100 mg, 100 mg, Intravenous, Daily, Kirke Corin, Flossie Buffy, MD   traZODone (DESYREL) tablet 100 mg, 100 mg, Oral, QHS PRN, Steffanie Rainwater, MD   Exam: Current vital signs: BP 109/78   Pulse 77   Temp 98 F (36.7 C)   Resp (!) 9   Ht 6\' 1"  (1.854 m)   Wt 74.8 kg   SpO2 94%   BMI 21.77 kg/m  Vital signs in last 24 hours: Temp:  [97.7 F (36.5 C)-98 F (36.7 C)] 98 F (36.7 C) (09/02 1400) Pulse Rate:  [77-92] 77 (09/02 1728) Resp:  [9-19] 9 (09/02 1728) BP: (104-137)/(68-89) 109/78 (09/02 1727) SpO2:  [94 %-100 %] 94 % (09/02 1728) Weight:  [74.8 kg] 74.8 kg (09/02 1037)  GENERAL: Awake, alert in NAD HEENT: - Normocephalic and atraumatic, dry mm, LUNGS - Clear to auscultation bilaterally with no wheezes CV - S1S2 RRR, no m/r/g, equal pulses bilaterally. ABDOMEN - Soft, nontender, nondistended with normoactive BS Ext: warm, well perfused, intact peripheral pulses, no edema  NEURO:  Mental Status: AA&Ox3  Language: speech is clear  Naming, repetition, fluency, and comprehension intact. Cranial Nerves: PERRL EOMI, visual fields full, no facial asymmetry, facial sensation intact, hearing intact, tongue/uvula/soft palate midline, normal sternocleidomastoid and trapezius muscle strength. 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 Gait- deferred   Labs I have reviewed labs in epic and the results pertinent to this consultation are:  CBC    Component Value Date/Time   WBC 4.7 08/25/2022 1103   RBC 4.65 08/25/2022 1103   HGB 15.4 08/25/2022 1103   HCT 44.5 08/25/2022 1103   PLT 244 08/25/2022 1103   MCV 95.7 08/25/2022 1103   MCH 33.1  08/25/2022 1103   MCHC 34.6 08/25/2022 1103   RDW 12.1 08/25/2022 1103   LYMPHSABS 2.7 07/10/2022 1215   MONOABS 0.8 07/10/2022 1215   EOSABS 0.2 07/10/2022 1215   BASOSABS 0.1 07/10/2022 1215    CMP     Component Value Date/Time   NA 142 08/25/2022 1103   K 4.0 08/25/2022 1103   CL 105 08/25/2022 1103   CO2 20 (L) 08/25/2022 1103   GLUCOSE 101 (H) 08/25/2022 1103   BUN 6 08/25/2022 1103   CREATININE 0.91 08/25/2022 1103   CALCIUM 9.3 08/25/2022 1103   PROT 7.5 08/25/2022 1103   ALBUMIN 4.3 08/25/2022 1103   AST 43 (H) 08/25/2022 1103   ALT 38 08/25/2022 1103   ALKPHOS 60 08/25/2022 1103   BILITOT 0.4 08/25/2022 1103   GFRNONAA >60 08/25/2022 1103   GFRAA >60 09/05/2020 1200    Lipid Panel     Component Value Date/Time   CHOL 196 05/14/2022 0628   TRIG  93 05/14/2022 0628   HDL 57 05/14/2022 0628   CHOLHDL 3.4 05/14/2022 0628   VLDL 19 05/14/2022 0628   LDLCALC 120 (H) 05/14/2022 0628      Assessment:  John Vaughan is a 31 y.o. male with past medical history of GAD, depression, seizures, bipolar 2, ETOH abuse-drinks 8 40oz of beer/day, cocaine abuse, current cigarette smoker who presents to [facility] ED from behavioral health urgent care for medical screening evaluation and consideration for admission in the setting of alcohol withdrawal.  The patient states that he is a history of alcohol withdrawal seizures and has had to be admitted for this in the past.  He was seen at the behavioral health urgent care walk-in requesting alcohol detoxification.  He states that he had a seizure 2 days ago with tongue biting and urinary incontinence.  It was unwitnessed. He takes tegretol and gabapentin for seizures, however has not taken any medications in many months due to inability to afford medications. He last did crack cocaine yesterday, his last drink was yesterday. He says he has seizures all the time  He states he does not see a neurologist that behavioral health manages his  medications for seizures. ETOH level 249, UDS positive for cocaine  Seizures in the setting of medication noncompliance, and polysubstance abuse, ETOH use   Recommendations: - CIWA protocol  - Seizure precautions - Delirium precautions  - Continue gabapentin  - Keppra 500mg  BID  - Hold tegretol   SEIZURE PRECAUTIONS Per [facility] statutes, patients with seizures are not allowed to drive until they have been seizure-free for six months.   Use caution when using heavy equipment or power tools. Avoid working on ladders or at heights. Take showers instead of baths. Ensure the water temperature is not too high on the home water heater. Do not go swimming alone. Do not lock yourself in a room alone (i.e. bathroom). When caring for infants or small children, sit down when holding, feeding, or changing them to minimize risk of injury to the child in the event you have a seizure. Maintain good sleep hygiene. Avoid alcohol.    If patient has another seizure, call 911 and bring them back to the ED if: A.  The seizure lasts longer than 5 minutes.      B.  The patient doesn't wake shortly after the seizure or has new problems such as difficulty seeing, speaking or moving following the seizure C.  The patient was injured during the seizure D.  The patient has a temperature over 102 F (39C) E.  The patient vomited during the seizure and now is having trouble breathing   [facility] DNP, ACNPC-AG   NEUROHOSPITALIST ADDENDUM Performed a face to face diagnostic evaluation.   I have reviewed the contents of history and physical exam as documented by PA/ARNP/Resident and agree with above documentation.  I have discussed and formulated the above plan as documented. Edits to the note have been made as needed.  Impression/Key exam findings/Plan: He was having seizures even when he was taking his tegretol 3 times a day in the past so I think it is fair to switch him to Keppra. Tegretol is cheap and  costs $16.7 on [employer] without insurance but Keppra is cheaper at $8.6 for a months supply on [employer]. I discussed behavioral side effects of Keppra and asked him to come to the ED or urgent care if he is having behavioral sideeffects from Keppra and would recommend switching to Valproic  acid. I also considered Lamotrigine but since he misses several doses, not sure if Lamotrigine is the right choice given elevated risk of SJS in his caseif he misses several doses and resumes at higher dose.  CIWA protocol and I encouraged him to come to the ED if he relapses and tries to quit alcohol again due to his history of withdrawal and seizures.  Erick Blinks, MD [facility] [phone]   If 7pm to 7am, please call on call as listed on AMION.

## 2022-08-25 NOTE — ED Triage Notes (Addendum)
Pt bib ems from [facility]; sent for etoh withdrawal management, hx epilepsy, last seizure couple days ago; last drink approx 8 hours ago, drink approx 8-40 oz beer daily; pt currently not taking seizure meds, last took last Tuesday; bp 118/74, hr 97, O2 97% ra, cbg 86, T 97.25f; pt denies si/hi; pt endorses some low/mid back pain, chronic; pt received 1 mg ativan PTA

## 2022-08-25 NOTE — TOC CM/SW Note (Signed)
CSW added substance resources to Pt AVS

## 2022-08-25 NOTE — Progress Notes (Signed)
After a lengthy call to non emergent and being put on hold. I finally made them realize this patient is not safe to go by [employer].  I was told EMS  will be dispatch to my location.

## 2022-08-25 NOTE — ED Provider Notes (Signed)
Behavioral Health Urgent Care Medical Screening Exam  Patient Name: John Vaughan MRN: [id number] Date of Evaluation: 08/25/22 Chief Complaint:   Diagnosis:  Final diagnoses:  Alcohol abuse with intoxication (HCC)    History of Present illness: John Vaughan is a 31 y.o. male patient presented to [facility] as a walk in accompanied by his mother requesting alcohol detox and seeking residential substance abuse treatment.   [name], 31 y.o., male patient seen face to face by this provider, consulted with Dr. Gasper Sells; and chart reviewed on 08/25/22.  Per chart review patient has a past psychiatric history of MDD, GAD, cocaine use disorder, alcohol use disorder which includes alcohol withdrawal seizure and delirium.  Reports he has epilepsy. He is prescribed Tegretol, gabapentin, and Cymbalta. He has received substance abuse treatment in the past.  He has also been admitted to [facility] with his last admission 06/13/2022.  He is currently unemployed  On evaluation John Vaughan reports he drinks 8-40 ounce beers daily and has done so for as long as he can remember.  His last drink was 6 hours ago.  Reports he feels anxious and restless.  States anytime he tries to stop drinking he experiences a seizure and he begins to hallucinate.  When this happens, he drinks again to control his symptoms.  His last seizure was roughly 2 days ago.  He endorses using cocaine daily roughly $50 worth at a time.  His last use 2 days ago.   Of note: patient has a documented history of alcohol withdrawal seizure and delirium. He has a history of being medically admitted to the ICU and received Precedex infusion to complete detox.   During evaluation John Vaughan is observed sitting in the assessment room. His mother is present during the evaluation with his permission. He is disheveled and makes fair eye contact.  He is alert/oriented x4 and cooperative.  His speech is clear and  coherent but slowed and slurred at times.  He appears intoxicated and smells of alcohol.  He endorses depression with feelings of hopelessness, helplessness, and guilt.  He is denying suicidal ideations at this time.  However states as his withdrawal symptoms induce and intensify his depression. He denies HI/AVH. There is no indication that he is currently responding to internal/external stimuli or experiencing paranoid or delusional thought content.   Patient will need a higher level of care to complete alcohol detox .Discussed with patient he is in agreement. He will be given Ativan 1 mg PO at this time.   [facility] to [facility] ED.  Dr. Lynelle Doctor is the accepting physician.  Psychiatric Specialty Exam  Presentation  General Appearance:Disheveled  Eye Contact:Good  Speech:Slow; Clear and Coherent  Speech Volume:Normal  Handedness:Right   Mood and Affect  Mood:Anxious; Depressed; Hopeless  Affect:Congruent   Thought Process  Thought Processes:Coherent  Descriptions of Associations:Intact  Orientation:Full (Time, Place and Person)  Thought Content:Logical  Diagnosis of Schizophrenia or Schizoaffective disorder in past: No   Hallucinations:None voices tell him to get up and move around denies  Ideas of Reference:None  Suicidal Thoughts:No -- (Denies) Without Intent; Without Plan  Homicidal Thoughts:No   Sensorium  Memory:Immediate Fair; Recent Fair; Remote Fair  Judgment:Fair  Insight:Fair   Executive Functions  Concentration:Fair  Attention Span:Fair  Recall:Fair  Fund of Knowledge:Fair  Language:Good   Psychomotor Activity  Psychomotor Activity:Normal   Assets  Assets:Leisure Time; Physical Health; Resilience   Sleep  Sleep:Fair  Number of hours: 7.5  No data recorded  Physical Exam: Physical Exam Vitals and nursing note reviewed.  Constitutional:      Appearance: He is well-developed.  HENT:     Head: Normocephalic and  atraumatic.  Eyes:     General:        Right eye: No discharge.        Left eye: No discharge.  Cardiovascular:     Rate and Rhythm: Normal rate.     Heart sounds: No murmur heard. Pulmonary:     Effort: Pulmonary effort is normal.  Abdominal:     Palpations: Abdomen is soft.  Musculoskeletal:        General: Normal range of motion.     Cervical back: Normal range of motion.  Neurological:     Mental Status: He is alert and oriented to person, place, and time.  Psychiatric:        Mood and Affect: Mood is anxious and depressed. Affect is tearful.        Speech: Speech normal.        Behavior: Behavior is cooperative.        Thought Content: Thought content normal.        Cognition and Memory: Cognition normal.        Judgment: Judgment is impulsive.    Review of Systems  Constitutional: Negative.   HENT: Negative.    Eyes: Negative.   Respiratory: Negative.    Cardiovascular: Negative.   Musculoskeletal: Negative.   Skin: Negative.   Neurological: Negative.   Psychiatric/Behavioral:  Positive for depression and substance abuse. The patient is nervous/anxious.    Blood pressure (!) 125/96, pulse 99, temperature 98 F (36.7 C), temperature source Oral, resp. rate 19, SpO2 97 %. There is no height or weight on file to calculate BMI.  Musculoskeletal: Strength & Muscle Tone: within normal limits Gait & Station: normal Patient leans: N/A   [facility] MSE Discharge Disposition for Follow up and Recommendations: Based on my evaluation the patient appears to have an emergency medical condition for which I recommend the patient be transferred to the emergency department for further evaluation.   Patient will need a higher level of care to complete alcohol detox . [facility] to [facility] ED.  Dr. Lynelle Doctor is the accepting physician.   Ardis Hughs, NP 08/25/2022, 8:14 AM

## 2022-08-25 NOTE — Discharge Instructions (Signed)
Please see attached                   Intensive Outpatient Programs  [facility]    [facility] [address]     [address] [place],  [place]     [place], [place] [phone]      [phone]  [facility]   [facility]  (Inpatient and outpatient)  [phone] (Suboxone and Methadone) [address]           [phone]           [facility]: Alcohol & Drug Services    [facility] - Intensive Outpatient [address]     [address] [place], [place] [address]     [place], [place]  [phone]      [phone]  [facility] (Outpatient, Inpatient, [facility]  [facility] (Groups and Residental) (insurance only) [phone]    [place], [place]          [phone]       [facility]    [facility] (for caregivers and family) [address]     [address] [place], [place]     [place], [place] [phone]      [phone]  Residential Treatment Programs  [facility]  [place](2 years) Residential: 90 days)  [facility] ([facility] [address] [facility]      [address] [place], [place]     [place], [place] [phone]      [phone] or [phone]  [facility]    [facility] [address]      [address] [place], [place]     [place], [place] [phone]      [phone]  [facility]   [facility] ([facility]) [address]     [address] [place], [place] [address]     [place], [place] [phone]      [phone] Admissions: 8am-3pm M-F  [facility]: Residential Program [phone] Days)              [facility]: [facility]  [place], [place]     [place], [place]  [phone] or [phone]    (Walk in Hours over the weekend or by referral)   [facility]: Therapeutic  Alternatives:[phone] (for crisis response 24 hours a day)       Outpatient Psychiatry and Counseling  [facility]: Mobile Crisis Management:  [phone]  [facility] ([facility] [employer])         [address] [place], [place] [address] [phone]  [facility] [employer] sliding scale fee and walk in schedule: M-F 8am-12pm/1pm-3pm [address] [place], [place] [address] [phone]  [facility] Outpatient Services/ Intensive Outpatient Therapy Program [address] [place], [place] [address] [phone]  [facility]   [facility] [address]   [address] [place], [place] [address]    [place], [place] [address] [phone]     [phone]  [facility]               [facility] [address] [place] [address]    [place] [address] [phone]  [phone]  Andee Poles, MD    [facility] [address]    [address] [place] [address]    [place] [address] [phone]       [phone]  [facility]   [facility] [address]   [address] [place], [place] [phone]     [phone]  [facility]    [facility] [phone], [place], [place]                  [address] [phone]     [place], [place] [address] [phone] [facility]: (Spanish speakin) [phone]  [facility]    [facility] [address]    [address] [place], [place] [address]    [place], [place] [address] [phone]     [phone]
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
## 2022-08-25 NOTE — Progress Notes (Signed)
Nurse Asher Muir was notified about the arrival of John Vaughan their ED and the rationale for admission.

## 2022-08-25 NOTE — Hospital Course (Signed)
Presented to [facility] for detox. Has history of drinking and when he stops drinking he has a seizure. Last drink was 0400 today. Felt anxious, sweats, shakes with fast HR. Reports problems with his memories and visual hallucinations. Reports seeing a clown that is trying to "murder me" since Monday. Reports having chest pressure, worse with activity and better with he lays on side. States he always short of breath. Endorses constipation, has BM only a few times a month. Reports numbness in his feet and eye problems. Denies any HA, N/V.   Endorse suicidal ideations when he feels really bad. Thinking about his kid keeps him from acting on his SI. Wants help to get his life together.   Had ETOH-related seizures 4 years ago. Has had a seizure disorder since he was a child.    Social Hx: Follows [facility] for primary care needs. Disabled because of his seizure disorder. Lives with mother and sister. Smokes 1-4 cig/day. Drinks about 1 40 oz of beer per day but 8 40 oz during the weekend. Uses cocaine about once a week.

## 2022-08-25 NOTE — ED Provider Notes (Signed)
[facility] EMERGENCY DEPARTMENT Provider Note   CSN: [id number] Arrival date & time: 08/25/22  1025     History  No chief complaint on file.   John Vaughan is a 31 y.o. male.  HPI   31 year old male with medical history significant for anxiety, depression, alcohol abuse, seizure disorder, bipolar 2 disorder who presents to the emergency department from the behavioral health urgent care for medical screening evaluation and consideration for admission in the setting of alcohol withdrawal.  The patient states that he is a history of alcohol withdrawal seizures and has had to be admitted for this in the past.  He was seen at the behavioral health urgent care walk-in requesting alcohol detoxification.  He states that he had a seizure 2 days ago with tongue biting and urinary incontinence.  It was unwitnessed.  He did had a last drink 2 days before that.  His last drink today was around 6 to 8 hours ago.  He normally drinks 8 40oz beers a day.  He feels anxious and restless.  He endorses worsening depressive symptoms and anxiety in the setting of his alcohol use disorder.  He additionally endorses cocaine use.  He states that he uses crack cocaine.  He endorses chest tightness across his chest with no radiation.  He endorses passive SI with no plan in the setting of his alcohol use disorder.  He has a documented history of alcohol withdrawal seizures and delirium and has previously been admitted to the ICU under Precedex infusion.  Home Medications Prior to Admission medications   Medication Sig Start Date End Date Taking? Authorizing Provider  DULoxetine (CYMBALTA) 60 MG capsule Take 1 capsule (60 mg total) by mouth daily. 07/11/22  Yes Rankin, Shuvon B, NP  gabapentin (NEURONTIN) 300 MG capsule Take 1 capsule (300 mg total) by mouth 3 (three) times daily. 07/11/22  Yes Rankin, Shuvon B, NP  hydrOXYzine (ATARAX) 25 MG tablet Take 1 tablet (25 mg total) by mouth every 6  (six) hours as needed for anxiety. 06/16/22  Yes Comer Locket, MD  Multiple Vitamin (MULTIVITAMIN WITH MINERALS) TABS tablet Take 1 tablet by mouth daily. 06/16/22  Yes Singleton, Amy E, MD  pantoprazole (PROTONIX) 40 MG tablet Take 1 tablet (40 mg total) by mouth daily. Patient taking differently: Take 20 mg by mouth daily. 06/16/22  Yes Singleton, Amy E, MD  thiamine 100 MG tablet Take 1 tablet (100 mg total) by mouth daily with supper. 06/16/22  Yes Comer Locket, MD      Allergies    Patient has no known allergies.    Review of Systems   Review of Systems  All other systems reviewed and are negative.   Physical Exam Updated Vital Signs BP 114/68   Pulse 90   Temp 97.7 F (36.5 C) (Oral)   Resp (!) 9   Ht 6\' 1"  (1.854 m)   Wt 74.8 kg   SpO2 96%   BMI 21.77 kg/m  Physical Exam Vitals and nursing note reviewed.  Constitutional:      General: He is not in acute distress.    Appearance: He is well-developed.  HENT:     Head: Normocephalic and atraumatic.  Eyes:     Conjunctiva/sclera: Conjunctivae normal.  Cardiovascular:     Rate and Rhythm: Normal rate and regular rhythm.  Pulmonary:     Effort: Pulmonary effort is normal. No respiratory distress.     Breath sounds: Normal breath sounds.  Abdominal:  Palpations: Abdomen is soft.     Tenderness: There is no abdominal tenderness.  Musculoskeletal:        General: No swelling.     Cervical back: Neck supple.  Skin:    General: Skin is warm and dry.     Capillary Refill: Capillary refill takes less than 2 seconds.  Neurological:     General: No focal deficit present.     Mental Status: He is alert and oriented to person, place, and time. Mental status is at baseline.     Cranial Nerves: No cranial nerve deficit.     Sensory: No sensory deficit.     Motor: No weakness.     Coordination: Coordination normal.  Psychiatric:        Attention and Perception: He perceives visual hallucinations.        Mood and  Affect: Mood is anxious and depressed.        Speech: Speech normal.        Behavior: Behavior normal. Behavior is cooperative.        Thought Content: Thought content is not paranoid. Thought content includes suicidal ideation. Thought content does not include homicidal ideation. Thought content does not include suicidal plan.     ED Results / Procedures / Treatments   Labs (all labs ordered are listed, but only abnormal results are displayed) Labs Reviewed  COMPREHENSIVE METABOLIC PANEL - Abnormal; Notable for the following components:      Result Value   CO2 20 (*)    Glucose, Bld 101 (*)    AST 43 (*)    Anion gap 17 (*)    All other components within normal limits  ETHANOL - Abnormal; Notable for the following components:   Alcohol, Ethyl (B) 249 (*)    All other components within normal limits  RAPID URINE DRUG SCREEN, HOSP PERFORMED - Abnormal; Notable for the following components:   Cocaine POSITIVE (*)    All other components within normal limits  CBG MONITORING, ED - Abnormal; Notable for the following components:   Glucose-Capillary 115 (*)    All other components within normal limits  RESP PANEL BY RT-PCR (FLU A&B, COVID) ARPGX2  CBC  MAGNESIUM  TROPONIN I (HIGH SENSITIVITY)  TROPONIN I (HIGH SENSITIVITY)    EKG EKG Interpretation  Date/Time:  Saturday August 25 2022 13:14:10 EDT Ventricular Rate:  87 PR Interval:  178 QRS Duration: 110 QT Interval:  366 QTC Calculation: 441 R Axis:   83 Text Interpretation: Sinus rhythm Confirmed by Ernie Avena ([id number]) on 08/25/2022 1:15:11 PM  Radiology DG Chest Portable 1 View  Result Date: 08/25/2022 CLINICAL DATA:  Chest tightness/pain. EXAM: PORTABLE CHEST 1 VIEW COMPARISON:  Chest radiographs 05/01/2022 FINDINGS: The cardiomediastinal silhouette is within normal limits. The lungs are well inflated and clear. There is no evidence of pleural effusion or pneumothorax. No acute osseous abnormality is identified.  IMPRESSION: No active disease. Electronically Signed   By: Sebastian Ache M.D.   On: 08/25/2022 11:52    Procedures Procedures    Medications Ordered in ED Medications  LORazepam (ATIVAN) tablet 1-4 mg (1 mg Oral Given 08/25/22 1143)    Or  LORazepam (ATIVAN) injection 1-4 mg ( Intravenous See Alternative 08/25/22 1143)  thiamine (VITAMIN B1) tablet 100 mg (100 mg Oral Given 08/25/22 1122)    Or  thiamine (VITAMIN B1) injection 100 mg ( Intravenous See Alternative 08/25/22 1122)  folic acid (FOLVITE) tablet 1 mg (1 mg Oral Given 08/25/22 1119)  multivitamin with minerals tablet 1 tablet (1 tablet Oral Given 08/25/22 1119)  albuterol (VENTOLIN HFA) 108 (90 Base) MCG/ACT inhaler 1-2 puff (2 puffs Inhalation Given 08/25/22 1121)    ED Course/ Medical Decision Making/ A&P Clinical Course as of 08/25/22 1402  Sat Aug 25, 2022  1302 COCAINE(!): POSITIVE [JL]  1302 Alcohol, Ethyl (B)(!): 249 [JL]  1302 Glucose-Capillary(!): 115 [JL]    Clinical Course User Index [JL] Ernie Avena, MD                           Medical Decision Making Amount and/or Complexity of Data Reviewed Labs: ordered. Decision-making details documented in ED Course. Radiology: ordered.  Risk OTC drugs. Prescription drug management. Decision regarding hospitalization.   31 year old male with medical history significant for anxiety, depression, alcohol abuse, seizure disorder, bipolar 2 disorder who presents to the emergency department from the behavioral health urgent care for medical screening evaluation and consideration for admission in the setting of alcohol withdrawal.  The patient states that he is a history of alcohol withdrawal seizures and has had to be admitted for this in the past.  He was seen at the behavioral health urgent care walk-in requesting alcohol detoxification.  He states that he had a seizure 2 days ago with tongue biting and urinary incontinence.  It was unwitnessed.  He did had a last drink 2 days  before that.  His last drink today was around 6 to 8 hours ago.  He normally drinks 8 40oz beers a day.  He feels anxious and restless.  He endorses worsening depressive symptoms and anxiety in the setting of his alcohol use disorder.  He additionally endorses cocaine use.  He states that he uses crack cocaine.  He endorses chest tightness across his chest with no radiation.  He endorses passive SI with no plan in the setting of his alcohol use disorder.  He has a documented history of alcohol withdrawal seizures and delirium and has previously been admitted to the ICU under Precedex infusion.  On arrival, the patient was afebrile, not tachycardic or tachypneic, hemodynamically stable, saturating well on room air.  Physical exam unremarkable.  Patient CIWA was reportedly 19 on arrival.  He was administered 1 mg of Ativan, had previously been administered 1 mg of Ativan prior to arrival.  Repeat CIWA was improved to 7.  The patient presents with concern for alcohol withdrawal, seizures in the setting of alcohol withdrawal.  He desires to quit drinking.  He endorses passive SI with no clear plan.  Endorses protective factors.  He is presenting voluntarily for evaluation.  Initial EKG sinus rhythm, ventricular rate 87, no abnormal intervals, no ischemic changes noted.  Chest x-ray was performed which revealed no acute cardiac or pulmonary normality, reviewed by myself and radiology and interpreted by myself and radiology.  Initial troponin was normal, EKG revealed a likely alcoholic ketoacidosis with a bicarbonate of 20, anion gap of 17 noted, blood glucose notably normal.  Troponin normal, low concern for ACS.  Considered cocaine induced chest discomfort.  Considered anxiety in the setting of alcohol withdrawal.  The patient's UDS was positive for cocaine, his initial ethanol level was elevated to 249.  No other significant laboratory abnormalities present with normal renal and liver function with mildly  elevated AST to 43.  Given the patient's elevated CIWA, improvement with p.o. Ativan, feel patient meets admission criteria to the floor.  Medicine consulted for admission for alcohol withdrawal.  Final Clinical Impression(s) / ED Diagnoses Final diagnoses:  Alcohol withdrawal syndrome with perceptual disturbance [facility])    Rx / DC Orders ED Discharge Orders     None         Ernie Avena, MD 08/25/22 1402

## 2022-08-25 NOTE — Progress Notes (Signed)
I received a second call from EMS asking this writer to clarify why EMS is needed instead of [facility].

## 2022-08-25 NOTE — Progress Notes (Signed)
   08/25/22 0620  [facility] Triage Screening (Walk-ins at [facility] only)  What Is the Reason for Your Visit/Call Today? Pt is a 31 year old male who presents to [facility] accompanied by his mother Morrison Mcbryar [phone], who participated in triage at Pt's request. He has diagnosis of major depressive disorder, generalized anxiety disorder, and a history of alcohol and cocaine use. He reports drinking up to 8 cans of 40-ounce beer daily when available and states he drank 3 cans prior to arrival. He appears intoxicated, smelling of alcohol. He reports smoking approximately $50 worth of cocaine two days ago and denies other substance use. Pt states he has a history of seizures with last seizure two days ago. He says he has stopped taking prescribed mental health medication and feels severely depressed and anxious. He reports poor appetite and sleep. He is seeking treatment at this time because his life has become unmanageable. He denies current suicidal ideation, homicidal ideation, or psychotic symptoms. He was inpatient at [facility] 06/21-06/24/2023. He says he wants to be admitted again for detox, medication management, and then follow up with residential treatment.  How Long Has This Been Causing You Problems? > than 6 months  Have You Recently Had Any Thoughts About Hurting Yourself? No  Are You Planning to Commit Suicide/Harm Yourself At This time? No  Have you Recently Had Thoughts About Hurting Someone [name]? No  Are You Planning To Harm Someone At This Time? No  Are you currently experiencing any auditory, visual or other hallucinations? No  Have You Used Any Alcohol or Drugs in the Past 24 Hours? Yes  How long ago did you use Drugs or Alcohol? Within past 24 hours  What Did You Use and How Much? Approximately 120 ounces of beer  Do you have any current medical co-morbidities that require immediate attention? Yes  Please describe current medical co-morbidities that require immediate attention: Hx of cirrhosis  and seizure disorder  Clinician description of patient physical appearance/behavior: Pt is casually dressed and smells of alcohol. He is alert and oriented x4. Pt speaks in a clear tone, at moderate volume and normal pace. Motor behavior appears normal. Eye contact is good and Pt's eyes are glassy. His mood is depressed and very anxious, affect is congruent with mood. Thought process is coherent and relevant. There is no indication Pt is currently responding to internal stimuli or experiencing delusional thought content. He is cooperative.  What Do You Feel Would Help You the Most Today? Alcohol or Drug Use Treatment;Treatment for Depression or other mood problem  If access to [facility] was not available, would you have sought care in the Emergency Department? Yes  Determination of Need Urgent (48 hours)  Options For Referral Inpatient Hospitalization;Facility-Based Crisis;Medication Management

## 2022-08-25 NOTE — Discharge Instructions (Addendum)
Transfer to the [facility] for medical clear clearance and medical alcohol detox

## 2022-08-25 NOTE — Plan of Care (Signed)

## 2022-08-25 NOTE — Progress Notes (Signed)
Patient transported by EMS without incident

## 2022-08-26 ENCOUNTER — Inpatient Hospital Stay (HOSPITAL_COMMUNITY): Payer: Self-pay

## 2022-08-26 DIAGNOSIS — Z87898 Personal history of other specified conditions: Secondary | ICD-10-CM

## 2022-08-26 DIAGNOSIS — F10932 Alcohol use, unspecified with withdrawal with perceptual disturbance: Secondary | ICD-10-CM

## 2022-08-26 DIAGNOSIS — R270 Ataxia, unspecified: Secondary | ICD-10-CM

## 2022-08-26 DIAGNOSIS — F1490 Cocaine use, unspecified, uncomplicated: Secondary | ICD-10-CM

## 2022-08-26 LAB — HIV ANTIBODY (ROUTINE TESTING W REFLEX): HIV Screen 4th Generation wRfx: NONREACTIVE

## 2022-08-26 MED ORDER — VITAMIN B-12 1000 MCG PO TABS
1000.0000 ug | ORAL_TABLET | Freq: Every day | ORAL | Status: DC
  Administered 2022-08-26 – 2022-08-28 (×3): 1000 ug via ORAL
  Filled 2022-08-26 (×3): qty 1

## 2022-08-26 MED ORDER — THIAMINE HCL 100 MG/ML IJ SOLN
500.0000 mg | Freq: Three times a day (TID) | INTRAVENOUS | Status: AC
  Administered 2022-08-26 – 2022-08-28 (×6): 500 mg via INTRAVENOUS
  Filled 2022-08-26 (×6): qty 5

## 2022-08-26 MED ORDER — LORAZEPAM 2 MG/ML IJ SOLN: 1.0000 mg | INTRAMUSCULAR | Status: DC | PRN

## 2022-08-26 MED ORDER — THIAMINE HCL 100 MG/ML IJ SOLN
500.0000 mg | Freq: Every day | INTRAVENOUS | Status: DC
  Administered 2022-08-26: 500 mg via INTRAVENOUS
  Filled 2022-08-26: qty 5

## 2022-08-26 NOTE — Progress Notes (Signed)
Patient having visual and some auditory hallucinations throughout shift, MD made aware.

## 2022-08-26 NOTE — Progress Notes (Signed)
Subjective: I spoke with the patient and his mother.  He has been having some encephalopathy for quite a while as well as unsteady gait.  His mom has noticed that his eyes "jump" for at least multiple months.  Patient endorses double vision at times, though he says this comes and goes.  He also feels unsteady.  He has been losing weight steadily over the past couple of years.  Exam: Vitals:   08/26/22 1237 08/26/22 1238  BP: 111/81   Pulse:  78  Resp:  14  Temp:    SpO2:  96%   Gen: In bed, NAD Resp: non-labored breathing, no acute distress Abd: soft, nt  Neuro: MS: Awake, alert, interactive and appropriate CN: EOMI with significant saccadic intrusion and some end gaze nystagmus Motor: 5/5 throughout Cerebellar: I question mild ataxia bilaterally   Pertinent Labs: CMP is unremarkable, cocaine is positive  Impression: 31 year old male with longstanding history of drug abuse including cocaine as well as alcohol abuse who presents with breakthrough seizures in the setting of noncompliance.  I agree with changing his antiepileptic to Keppra.  Agree with the internal medicine teaching service that he does have some findings concerning for possible Wernicke's, but by history I think these have been present for quite some time and so he may or may not get benefit from thiamine repletion.  That being said I think it is worthwhile to attempt this and I typically do 500 mg 3 times daily.   Unfortunately B1 that has been sent was performed after initial thiamine repletion, so it will be unhelpful.  I suspect that he will have significant alcohol withdrawal, and agree with Librium and CIWA protocol.  Recommendations: 1) thiamine 500 mg 3 times daily for at least six doses 2) Keppra 500 mg twice daily 3) CIWA 4) neurology will be available on an as-needed basis.   Ritta Slot, MD [facility] [phone]  If 7pm- 7am, please page neurology on call as listed in  [facility].

## 2022-08-26 NOTE — Progress Notes (Signed)
   Subjective: Patient evaluated at bedside. Feeling better today, not seeing things, no anxiousness, no hallucinations, no seizures. He is dizzy while standing and sitting and feels as if the room is spinning an falls if trying to walk.Says he is going to run away after the hospital. Wants to stop drinking but always discharged without help. Tried meds in the past but made him feel weird. Best things have been his friends who give him support and who do not drink or use substances. 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  Objective:  Vital signs in last 24 hours: Vitals:   08/25/22 1727 08/25/22 1728 08/25/22 1925 08/25/22 2347  BP: 109/78  106/79 116/69  Pulse:  77 97 76  Resp:  (!) 9 16 16   Temp:   98 F (36.7 C) 98.1 F (36.7 C)  TempSrc:   Oral Oral  SpO2:  94% 94% 99%  Weight:      Height:       , well nourished, well developed, pleasant and cooperative HEENT: Horizontal nystagmus, EOMI, no scleral icterus, scalp wound over the R frontal bone without fluctuance/step off/crepitus CV: RRR, no m/r/g Pulm: normal work of breathing, LCTAB Neuro: truncal ataxia, positive romberg sign, 2+ reflexes at the knee bilaterally  Assessment/Plan:  Principal Problem:   Alcohol withdrawal (HCC)  Patient is a 31 year old male with past psychiatric history of MDD, bipolar affective disorder, alcohol abuse with withdrawal seizures, cocaine abuse, NSSIB, GAD, and multiple psych hospitalizations who is presenting for alcohol withdrawal.   AUD Hx of alcohol withdrawal seizures C/F wernicke's encephalopathy No seizures overnight and CIWA <10. Patient has gait ataxia, nystagmus, no encephalopathy, but given history concerning for wernicke's. Will get MRI brain. Otherwise he is hemodynamically stable without fever or tachycardia, no longer showing clinical evidence of withdrawal. -High dose IV Thiamine 500mg  -folate 1 mg daily -Prn  hydroxyzine -librium 25 mg taper:x4->x3-> x2->x1 -CIWA protocol, contact physician if ativan needed -F/U MRI   Pt reported history of epilepsy since childhood Neurology following. -Continue gabapentin 300 TID -Star keppra 500mg  BID -hold carbamazapine -Neurology consult   Anxiety -Continue Gabapenten 300 TID -Continue cymbalta 60 daily   Bilateral LE peripheral neuropathy B12 and folate normal 05/2022. 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   HX of suicide attempt Contracted for safety.  Prior to Admission Living Arrangement: Anticipated Discharge Location: Barriers to Discharge: Dispo: Anticipated discharge in approximately 1 day(s).   , MD 08/26/2022, 9:50 AM Pager: [phone] After 5pm on weekdays and 1pm on weekends: On Call pager [phone]

## 2022-08-27 ENCOUNTER — Encounter (HOSPITAL_COMMUNITY): Payer: Self-pay | Admitting: Internal Medicine

## 2022-08-27 DIAGNOSIS — F10932 Alcohol use, unspecified with withdrawal with perceptual disturbance: Secondary | ICD-10-CM | POA: Diagnosis not present

## 2022-08-27 DIAGNOSIS — F1020 Alcohol dependence, uncomplicated: Secondary | ICD-10-CM | POA: Diagnosis not present

## 2022-08-27 MED ORDER — CHLORDIAZEPOXIDE HCL 5 MG PO CAPS
25.0000 mg | ORAL_CAPSULE | Freq: Once | ORAL | Status: AC
  Administered 2022-08-28: 25 mg via ORAL
  Filled 2022-08-27: qty 5

## 2022-08-27 MED ORDER — HYDROXYZINE HCL 25 MG PO TABS
50.0000 mg | ORAL_TABLET | Freq: Four times a day (QID) | ORAL | Status: AC | PRN
  Administered 2022-08-27: 50 mg via ORAL
  Filled 2022-08-27: qty 2
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
## 2022-08-27 NOTE — Plan of Care (Signed)
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
## 2022-08-28 ENCOUNTER — Encounter (HOSPITAL_COMMUNITY): Payer: Self-pay | Admitting: Internal Medicine

## 2022-08-28 ENCOUNTER — Other Ambulatory Visit (HOSPITAL_COMMUNITY): Payer: Self-pay

## 2022-08-28 DIAGNOSIS — F1020 Alcohol dependence, uncomplicated: Secondary | ICD-10-CM

## 2022-08-28 DIAGNOSIS — F332 Major depressive disorder, recurrent severe without psychotic features: Secondary | ICD-10-CM

## 2022-08-28 MED ORDER — HYDROXYZINE HCL 25 MG PO TABS: 50.0000 mg | ORAL_TABLET | Freq: Four times a day (QID) | ORAL | 0 refills | Status: DC | PRN

## 2022-08-28 MED ORDER — TRAZODONE HCL 100 MG PO TABS
100.0000 mg | ORAL_TABLET | Freq: Every evening | ORAL | 1 refills | Status: DC | PRN
  Filled 2022-08-28: qty 30, 30d supply, fill #0

## 2022-08-28 MED ORDER — DULOXETINE HCL 60 MG PO CPEP
60.0000 mg | ORAL_CAPSULE | Freq: Every day | ORAL | 0 refills | Status: DC
  Filled 2022-08-28: qty 30, 30d supply, fill #0

## 2022-08-28 MED ORDER — GABAPENTIN 300 MG PO CAPS
300.0000 mg | ORAL_CAPSULE | Freq: Three times a day (TID) | ORAL | 0 refills | Status: DC
  Filled 2022-08-28: qty 90, 30d supply, fill #0

## 2022-08-28 MED ORDER — CYANOCOBALAMIN 1000 MCG PO TABS
1000.0000 ug | ORAL_TABLET | Freq: Every day | ORAL | 2 refills | Status: DC
  Filled 2022-08-28: qty 30, 30d supply, fill #0

## 2022-08-28 MED ORDER — THIAMINE HCL 100 MG PO TABS: 100.0000 mg | ORAL_TABLET | Freq: Every day | ORAL | 0 refills | Status: DC

## 2022-08-28 MED ORDER — LEVETIRACETAM 500 MG PO TABS
500.0000 mg | ORAL_TABLET | Freq: Two times a day (BID) | ORAL | 2 refills | Status: DC
  Filled 2022-08-28: qty 60, 30d supply, fill #0

## 2022-08-28 NOTE — TOC Transition Note (Signed)
Transition of Care [facility]) - CM/SW Discharge Note   Patient Details  Name: John Vaughan MRN: [id number] Date of Birth: [birth year]  Transition of Care [facility] CM/SW Contact:  Epifanio Lesches, RN Phone Number: 08/28/2022, 11:23 AM   Clinical Narrative:    Patient will DC to: home Anticipated DC date: 08/28/2022 Family notified: Transport by: car  Admitted for ETOH withdrawal. Per MD patient ready for DC today.RN, patient, and patient's mom aware of DC. NCM spoke with pt  @ bedside regarding d/c needs. Pt without PCP, no health insurance. Jobless. Income limited .NCM  shared [facility] and provided information. Pt interested .NCM arranged post hospital f/u and noted on AVS. Referral made with Financial Counselor. F.C to see pt prior to d/c. Outpatient resources noted on AVS to assist with substance abuse /depression.   Pt will transition to home with mom. States without transportation issues.States mom will assist with transportation to MD's appointments.  TOC pharmacy to fill and deliver Rx meds to bedside prior to d/c.  RNCM will sign off for now as intervention is no longer needed. Please consult [place] again if new needs arise.    Final next level of care: Home/Self Care Barriers to Discharge: No Barriers Identified   Patient Goals and CMS Choice        Discharge Placement                       Discharge Plan and Services In-house Referral: Financial Counselor Discharge Planning Services: CM Consult                                 Social Determinants of Health (SDOH) Interventions     Readmission Risk Interventions     No data to display

## 2022-08-28 NOTE — Social Work (Addendum)
CSW met with pt at bedside to discuss Substance Use and mental health concerns. Pt notes that he uses alcohol to cope with depression. He states he is not able to afford his medications and does not have a PCP. CSW notified CM of need for PCP and referral to [facility]. CSW educated pt on the effects of alcohol on mental health and the importance of following up with providers whenever possible. Pt notes he has been dealing with this for years and will follow up if PCP is located. TOC will be available for any further needs.  2:35 MD notified CSW pt's mother had questions regarding next steps for treatment. CSW spoke with Hilda Blades and went over resources and referrals. CSW directed family [place] as Mother states pt has insurance through [employer]. Hilda Blades confirmed she will pick up pt after 4 PM.
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
## 2022-08-28 NOTE — Plan of Care (Signed)

## 2022-08-28 NOTE — Progress Notes (Signed)
AVS given and explained to patient, pt received meds from pharmacy awaiting for ride.

## 2022-08-29 LAB — VITAMIN B1: Vitamin B1 (Thiamine): 119.5 nmol/L (ref 66.5–200.0)

## 2022-09-03 ENCOUNTER — Inpatient Hospital Stay: Payer: Self-pay | Admitting: Family Medicine

## 2022-09-19 ENCOUNTER — Encounter (HOSPITAL_COMMUNITY): Payer: Self-pay | Admitting: Psychiatry

## 2022-09-19 ENCOUNTER — Emergency Department (HOSPITAL_COMMUNITY)
Admission: EM | Admit: 2022-09-19 | Discharge: 2022-09-19 | Disposition: A | Discharge status: Other Acute Inpatient Hospital | Payer: Federal, State, Local not specified - Other | Attending: Emergency Medicine | Admitting: Emergency Medicine

## 2022-09-19 ENCOUNTER — Inpatient Hospital Stay (HOSPITAL_COMMUNITY)
Admission: AD | Admit: 2022-09-19 | Discharge: 2022-09-25 | DRG: 885 | Disposition: A | Discharge status: Home / Self Care | Payer: Federal, State, Local not specified - Other | Source: Intra-hospital | Attending: Emergency Medicine | Admitting: Emergency Medicine

## 2022-09-19 ENCOUNTER — Other Ambulatory Visit: Payer: Self-pay

## 2022-09-19 DIAGNOSIS — F1510 Other stimulant abuse, uncomplicated: Secondary | ICD-10-CM | POA: Diagnosis present

## 2022-09-19 DIAGNOSIS — F10239 Alcohol dependence with withdrawal, unspecified: Secondary | ICD-10-CM | POA: Diagnosis present

## 2022-09-19 DIAGNOSIS — F411 Generalized anxiety disorder: Secondary | ICD-10-CM | POA: Diagnosis present

## 2022-09-19 DIAGNOSIS — F10288 Alcohol dependence with other alcohol-induced disorder: Secondary | ICD-10-CM | POA: Insufficient documentation

## 2022-09-19 DIAGNOSIS — Z818 Family history of other mental and behavioral disorders: Secondary | ICD-10-CM | POA: Diagnosis not present

## 2022-09-19 DIAGNOSIS — F1010 Alcohol abuse, uncomplicated: Secondary | ICD-10-CM

## 2022-09-19 DIAGNOSIS — Y908 Blood alcohol level of 240 mg/100 ml or more: Secondary | ICD-10-CM | POA: Diagnosis present

## 2022-09-19 DIAGNOSIS — G4700 Insomnia, unspecified: Secondary | ICD-10-CM | POA: Diagnosis present

## 2022-09-19 DIAGNOSIS — Z9151 Personal history of suicidal behavior: Secondary | ICD-10-CM

## 2022-09-19 DIAGNOSIS — K219 Gastro-esophageal reflux disease without esophagitis: Secondary | ICD-10-CM | POA: Diagnosis present

## 2022-09-19 DIAGNOSIS — R45851 Suicidal ideations: Secondary | ICD-10-CM | POA: Diagnosis present

## 2022-09-19 DIAGNOSIS — Z20822 Contact with and (suspected) exposure to covid-19: Secondary | ICD-10-CM | POA: Diagnosis present

## 2022-09-19 DIAGNOSIS — F10939 Alcohol use, unspecified with withdrawal, unspecified: Secondary | ICD-10-CM | POA: Diagnosis present

## 2022-09-19 DIAGNOSIS — Z811 Family history of alcohol abuse and dependence: Secondary | ICD-10-CM | POA: Diagnosis not present

## 2022-09-19 DIAGNOSIS — Z23 Encounter for immunization: Secondary | ICD-10-CM | POA: Diagnosis not present

## 2022-09-19 DIAGNOSIS — F17210 Nicotine dependence, cigarettes, uncomplicated: Secondary | ICD-10-CM | POA: Diagnosis present

## 2022-09-19 DIAGNOSIS — Z79899 Other long term (current) drug therapy: Secondary | ICD-10-CM

## 2022-09-19 DIAGNOSIS — F1020 Alcohol dependence, uncomplicated: Secondary | ICD-10-CM | POA: Diagnosis present

## 2022-09-19 DIAGNOSIS — R443 Hallucinations, unspecified: Secondary | ICD-10-CM | POA: Insufficient documentation

## 2022-09-19 DIAGNOSIS — F1410 Cocaine abuse, uncomplicated: Secondary | ICD-10-CM | POA: Diagnosis present

## 2022-09-19 DIAGNOSIS — F1490 Cocaine use, unspecified, uncomplicated: Secondary | ICD-10-CM | POA: Insufficient documentation

## 2022-09-19 DIAGNOSIS — Z765 Malingerer [conscious simulation]: Secondary | ICD-10-CM

## 2022-09-19 DIAGNOSIS — G40909 Epilepsy, unspecified, not intractable, without status epilepticus: Secondary | ICD-10-CM | POA: Diagnosis present

## 2022-09-19 DIAGNOSIS — F1999 Other psychoactive substance use, unspecified with unspecified psychoactive substance-induced disorder: Secondary | ICD-10-CM | POA: Diagnosis present

## 2022-09-19 DIAGNOSIS — Z720 Tobacco use: Secondary | ICD-10-CM | POA: Diagnosis present

## 2022-09-19 DIAGNOSIS — F331 Major depressive disorder, recurrent, moderate: Secondary | ICD-10-CM | POA: Diagnosis not present

## 2022-09-19 DIAGNOSIS — F339 Major depressive disorder, recurrent, unspecified: Principal | ICD-10-CM | POA: Diagnosis present

## 2022-09-19 DIAGNOSIS — F332 Major depressive disorder, recurrent severe without psychotic features: Secondary | ICD-10-CM | POA: Diagnosis not present

## 2022-09-19 LAB — CBC WITH DIFFERENTIAL/PLATELET
Abs Immature Granulocytes: 0.03 10*3/uL (ref 0.00–0.07)
Basophils Absolute: 0.1 10*3/uL (ref 0.0–0.1)
Basophils Relative: 1 %
Eosinophils Absolute: 0.1 10*3/uL (ref 0.0–0.5)
Eosinophils Relative: 2 %
HCT: 45.6 % (ref 39.0–52.0)
Hemoglobin: 15.7 g/dL (ref 13.0–17.0)
Immature Granulocytes: 1 %
Lymphocytes Relative: 48 %
Lymphs Abs: 2.9 10*3/uL (ref 0.7–4.0)
MCH: 33.4 pg (ref 26.0–34.0)
MCHC: 34.4 g/dL (ref 30.0–36.0)
MCV: 97 fL (ref 80.0–100.0)
Monocytes Absolute: 0.8 10*3/uL (ref 0.1–1.0)
Monocytes Relative: 14 %
Neutro Abs: 2.1 10*3/uL (ref 1.7–7.7)
Neutrophils Relative %: 34 %
Platelets: 290 10*3/uL (ref 150–400)
RBC: 4.7 MIL/uL (ref 4.22–5.81)
RDW: 12.6 % (ref 11.5–15.5)
WBC: 6 10*3/uL (ref 4.0–10.5)
nRBC: 0 % (ref 0.0–0.2)

## 2022-09-19 LAB — COMPREHENSIVE METABOLIC PANEL
ALT: 17 U/L (ref 0–44)
AST: 24 U/L (ref 15–41)
Albumin: 4.6 g/dL (ref 3.5–5.0)
Alkaline Phosphatase: 56 U/L (ref 38–126)
Anion gap: 14 (ref 5–15)
BUN: 8 mg/dL (ref 6–20)
CO2: 23 mmol/L (ref 22–32)
Calcium: 9.4 mg/dL (ref 8.9–10.3)
Chloride: 103 mmol/L (ref 98–111)
Creatinine, Ser: 0.85 mg/dL (ref 0.61–1.24)
GFR, Estimated: >60 mL/min (ref >60)
Glucose, Bld: 93 mg/dL (ref 70–99)
Potassium: 3.8 mmol/L (ref 3.5–5.1)
Sodium: 140 mmol/L (ref 135–145)
Total Bilirubin: 0.6 mg/dL (ref 0.3–1.2)
Total Protein: 8.1 g/dL (ref 6.5–8.1)

## 2022-09-19 LAB — RESP PANEL BY RT-PCR (FLU A&B, COVID) ARPGX2
Influenza A by PCR: NEGATIVE
Influenza B by PCR: NEGATIVE
SARS Coronavirus 2 by RT PCR: NEGATIVE

## 2022-09-19 LAB — ETHANOL: Alcohol, Ethyl (B): 346 mg/dL (ref <10)

## 2022-09-19 LAB — RAPID URINE DRUG SCREEN, HOSP PERFORMED
Amphetamines: NOT DETECTED
Barbiturates: NOT DETECTED
Benzodiazepines: NOT DETECTED
Cocaine: POSITIVE — AB
Opiates: NOT DETECTED
Tetrahydrocannabinol: NOT DETECTED

## 2022-09-19 LAB — SALICYLATE LEVEL: Salicylate Lvl: <7 mg/dL — ABNORMAL LOW (ref 7.0–30.0)

## 2022-09-19 LAB — ACETAMINOPHEN LEVEL: Acetaminophen (Tylenol), Serum: <10 ug/mL — ABNORMAL LOW (ref 10–30)

## 2022-09-19 MED ORDER — VITAMIN B-1 100 MG PO TABS
100.0000 mg | ORAL_TABLET | Freq: Every day | ORAL | Status: DC
  Administered 2022-09-20 – 2022-09-25 (×6): 100 mg via ORAL
  Filled 2022-09-19 (×7): qty 1

## 2022-09-19 MED ORDER — LORAZEPAM 2 MG/ML IJ SOLN: 0.0000 mg | Freq: Four times a day (QID) | INTRAMUSCULAR | Status: DC

## 2022-09-19 MED ORDER — LORAZEPAM 2 MG/ML IJ SOLN: 0.0000 mg | Freq: Two times a day (BID) | INTRAMUSCULAR | Status: DC

## 2022-09-19 MED ORDER — LORATADINE 10 MG PO TABS
10.0000 mg | ORAL_TABLET | Freq: Once | ORAL | Status: AC
  Administered 2022-09-19: 10 mg via ORAL
  Filled 2022-09-19: qty 1

## 2022-09-19 MED ORDER — THIAMINE HCL 100 MG/ML IJ SOLN
100.0000 mg | Freq: Once | INTRAMUSCULAR | Status: AC
  Administered 2022-09-19: 100 mg via INTRAMUSCULAR
  Filled 2022-09-19: qty 2

## 2022-09-19 MED ORDER — ALUM & MAG HYDROXIDE-SIMETH 200-200-20 MG/5ML PO SUSP: 30.0000 mL | ORAL | Status: DC | PRN

## 2022-09-19 MED ORDER — THIAMINE MONONITRATE 100 MG PO TABS
100.0000 mg | ORAL_TABLET | Freq: Every day | ORAL | Status: DC
  Administered 2022-09-19: 100 mg via ORAL
  Filled 2022-09-19: qty 1

## 2022-09-19 MED ORDER — HYDROXYZINE HCL 25 MG PO TABS
25.0000 mg | ORAL_TABLET | Freq: Four times a day (QID) | ORAL | Status: DC | PRN
  Filled 2022-09-19: qty 1

## 2022-09-19 MED ORDER — LOPERAMIDE HCL 2 MG PO CAPS: 2.0000 mg | ORAL_CAPSULE | ORAL | Status: AC | PRN

## 2022-09-19 MED ORDER — LORAZEPAM 1 MG PO TABS: 1.0000 mg | ORAL_TABLET | Freq: Three times a day (TID) | ORAL | Status: DC

## 2022-09-19 MED ORDER — ONDANSETRON 4 MG PO TBDP: 4.0000 mg | ORAL_TABLET | Freq: Four times a day (QID) | ORAL | Status: AC | PRN

## 2022-09-19 MED ORDER — ACETAMINOPHEN 325 MG PO TABS: 650.0000 mg | ORAL_TABLET | Freq: Four times a day (QID) | ORAL | Status: DC | PRN

## 2022-09-19 MED ORDER — LEVETIRACETAM ER 500 MG PO TB24
500.0000 mg | ORAL_TABLET | Freq: Two times a day (BID) | ORAL | Status: DC
  Administered 2022-09-19 – 2022-09-20 (×2): 500 mg via ORAL
  Filled 2022-09-19 (×5): qty 1

## 2022-09-19 MED ORDER — INFLUENZA VAC SPLIT QUAD 0.5 ML IM SUSY
0.5000 mL | PREFILLED_SYRINGE | INTRAMUSCULAR | Status: AC
  Administered 2022-09-20: 0.5 mL via INTRAMUSCULAR
  Filled 2022-09-19: qty 0.5

## 2022-09-19 MED ORDER — THIAMINE HCL 100 MG/ML IJ SOLN: 100.0000 mg | Freq: Every day | INTRAMUSCULAR | Status: DC

## 2022-09-19 MED ORDER — DULOXETINE HCL 30 MG PO CPEP
30.0000 mg | ORAL_CAPSULE | Freq: Every day | ORAL | Status: DC
  Administered 2022-09-19: 30 mg via ORAL
  Filled 2022-09-19 (×4): qty 1

## 2022-09-19 MED ORDER — LORAZEPAM 1 MG PO TABS: 1.0000 mg | ORAL_TABLET | Freq: Every day | ORAL | Status: DC

## 2022-09-19 MED ORDER — LORAZEPAM 1 MG PO TABS
1.0000 mg | ORAL_TABLET | Freq: Four times a day (QID) | ORAL | Status: DC | PRN
  Administered 2022-09-19: 1 mg via ORAL
  Filled 2022-09-19: qty 1

## 2022-09-19 MED ORDER — ADULT MULTIVITAMIN W/MINERALS CH
1.0000 | ORAL_TABLET | Freq: Every day | ORAL | Status: DC
  Administered 2022-09-19 – 2022-09-25 (×7): 1 via ORAL
  Filled 2022-09-19 (×9): qty 1

## 2022-09-19 MED ORDER — LORAZEPAM 1 MG PO TABS
1.0000 mg | ORAL_TABLET | Freq: Four times a day (QID) | ORAL | Status: DC
  Administered 2022-09-19: 1 mg via ORAL
  Filled 2022-09-19 (×2): qty 1

## 2022-09-19 MED ORDER — LORAZEPAM 1 MG PO TABS: 0.0000 mg | ORAL_TABLET | Freq: Two times a day (BID) | ORAL | Status: DC

## 2022-09-19 MED ORDER — NICOTINE 14 MG/24HR TD PT24
14.0000 mg | MEDICATED_PATCH | Freq: Every day | TRANSDERMAL | Status: DC
  Administered 2022-09-19 – 2022-09-25 (×7): 14 mg via TRANSDERMAL
  Filled 2022-09-19 (×3): qty 1
  Filled 2022-09-19: qty 14
  Filled 2022-09-19 (×6): qty 1

## 2022-09-19 MED ORDER — GABAPENTIN 300 MG PO CAPS
300.0000 mg | ORAL_CAPSULE | Freq: Two times a day (BID) | ORAL | Status: DC
  Administered 2022-09-19: 300 mg via ORAL
  Filled 2022-09-19 (×5): qty 1

## 2022-09-19 MED ORDER — MAGNESIUM HYDROXIDE 400 MG/5ML PO SUSP: 30.0000 mL | Freq: Every day | ORAL | Status: DC | PRN

## 2022-09-19 MED ORDER — LORAZEPAM 1 MG PO TABS
0.0000 mg | ORAL_TABLET | Freq: Four times a day (QID) | ORAL | Status: DC
  Administered 2022-09-19: 3 mg via ORAL
  Administered 2022-09-19: 2 mg via ORAL
  Filled 2022-09-19: qty 2
  Filled 2022-09-19: qty 3

## 2022-09-19 MED ORDER — BUSPIRONE HCL 10 MG PO TABS
5.0000 mg | ORAL_TABLET | Freq: Two times a day (BID) | ORAL | Status: DC
  Administered 2022-09-19 – 2022-09-25 (×12): 5 mg via ORAL
  Filled 2022-09-19 (×6): qty 1
  Filled 2022-09-19: qty 14
  Filled 2022-09-19: qty 1
  Filled 2022-09-19: qty 14
  Filled 2022-09-19 (×6): qty 1

## 2022-09-19 MED ORDER — TRAZODONE HCL 50 MG PO TABS
50.0000 mg | ORAL_TABLET | Freq: Every evening | ORAL | Status: DC | PRN
  Filled 2022-09-19: qty 1

## 2022-09-19 MED ORDER — LORAZEPAM 1 MG PO TABS: 1.0000 mg | ORAL_TABLET | Freq: Once | ORAL | Status: DC

## 2022-09-19 MED ORDER — LORAZEPAM 1 MG PO TABS: 1.0000 mg | ORAL_TABLET | Freq: Two times a day (BID) | ORAL | Status: DC

## 2022-09-19 NOTE — Progress Notes (Signed)
Pt was accepted to [facility] 09/19/22; BED ASSIGNMENT 403-1 at 1300  Pt meets inpatient criteria per Ricky Ala, NP   Attending Physician will be Dr. Nelda Marseille  Report can be called to: Adult unit: [phone]  Pt can arrive after [facility] Team notified: [facility] Lynnda Shields, RN, Alison Murray, RN, and Ricky Ala, NP   Nadara Mode, LCSWA 09/19/2022 @ 11:22 AM

## 2022-09-19 NOTE — H&P (Signed)
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 for PTSD but he will not elaborate to confirm diagnosis.   Diagnoses / Active Problems: MDD recurrent severe without psychotic features (r/o substance induced mood d/o) Alcohol use d/o Stimulant use d/o - cocaine type R/o PTSD GAD by hx Nicotine use d/o Seizure history GERD  PLAN: Safety and Monitoring:  -- Voluntary admission to inpatient psychiatric unit for safety, stabilization and treatment  -- Daily contact with patient to assess and evaluate symptoms and progress in treatment  -- Patient's case to be discussed in multi-disciplinary team meeting  -- Observation Level : q15 minute checks  -- Vital signs:  q12 hours  -- Precautions: suicide, elopement, and assault     2. Psychiatric Diagnoses and Treatment:   MDD, recurrent, severe without psychotic features (r/o SIMD) GAD by hx R/o PTSD -- Continue Cymbalta and titrate up to 60mg  which was his home dose -- Discussed start of Remeron 7.5mg  qhs to help with sleep, appetite and mood and he consents to med trial after r/b/se/a to med were reviewed -- Continue Neurontin 300mg  and increase to home dose of tid -- Continue Trazodone 100mg  qhs PRN insomnia -- Change vistaril to 50mg  q6 hours PRN anxiety -- Continue Buspar 5mg  bid started prior to admission   -- Encouraged patient to participate in unit milieu and in scheduled group therapies   -- Short Term Goals: Ability to disclose and discuss suicidal ideas, Ability to demonstrate self-control will improve, Ability to identify and develop effective coping behaviors will improve, and Ability to maintain clinical measurements within normal limits will improve  -- Long Term Goals: Improvement in symptoms so as ready for discharge   Alcohol use disorder Stimulant use disorder - cocaine type --  CIWA with scheduled change to Librium taper with PRN Librium for scores > 10 -- Continue thiamine 100 mg daily. -- Continue multivitamin with minerals daily -- Discussed option of residential rehab or [facility] and he will consider -- Continue Keppra and Neurontin for seizure prevention -- Patient requests Hepatitis testing and HIV testing NR recently  -- Short Term Goals: Ability to identify triggers associated with substance abuse/mental health issues will improve  -- Long Term Goals: Improvement in symptoms so as ready for discharge   3. Medical Issues Being Addressed:    Admission labs reviewed: UDS positive for cocaine; CBC WNL, CMP WNL, ETOH 346, Salicylate <7, Tylenol <10, respiratory panel negative; Lipase 43 on 9/2; Folate 31.9 9/2; B1 119.5 9/2, B12 255 9/2; CK 263 9/2; HIV NR 9/2; Troponin 2 on 9/2; Mag 2.3 9/2; Repeat EKG pending.    Tobacco Use Disorder  --  Nicotine patch /24 hours ordered  -- Smoking cessation encouraged  GERD --Continue Protonix EC 40 mg daily   Seizure d/o by hx -- Placed on seizure precautions -- Continue Keppra  bid and Neurontin  tid  Questionable Vitamin B12 deficiency -- restarted home Vit B12 daily  4. Discharge Planning:   -- Social work and case management to assist with discharge planning and identification of hospital follow-up needs prior to discharge  -- Estimated LOS: 5-7 days  -- Discharge Concerns: Need to establish a safety plan; Medication compliance and effectiveness  -- Discharge Goals: Return home with outpatient referrals for mental health follow-up including medication management/psychotherapy  I certify that inpatient services furnished can reasonably be expected to improve the patient's condition.    Comer Locket, MD, FAPA 9/28/20237:44 AM

## 2022-09-19 NOTE — ED Triage Notes (Signed)
Pt arrives from home via [facility] with request for detox from alcohol and SI.

## 2022-09-19 NOTE — Plan of Care (Signed)
  Problem: Education: Goal: Knowledge of [facility] Education information/materials will improve Outcome: Progressing Goal: Emotional status will improve Outcome: Progressing Goal: Mental status will improve Outcome: Progressing Goal: Verbalization of understanding the information provided will improve Outcome: Progressing   Problem: Activity: Goal: Interest or engagement in activities will improve Outcome: Progressing Goal: Sleeping patterns will improve Outcome: Progressing   Problem: Coping: Goal: Ability to verbalize frustrations and anger appropriately will improve Outcome: Progressing Goal: Ability to demonstrate self-control will improve Outcome: Progressing   Problem: Health Behavior/Discharge Planning: Goal: Identification of resources available to assist in meeting health care needs will improve Outcome: Progressing Goal: Compliance with treatment plan for underlying cause of condition will improve Outcome: Progressing   Problem: Physical Regulation: Goal: Ability to maintain clinical measurements within normal limits will improve Outcome: Progressing   Problem: Safety: Goal: Periods of time without injury will increase Outcome: Progressing   Problem: Education: Goal: Knowledge of disease or condition will improve Outcome: Progressing Goal: Understanding of discharge needs will improve Outcome: Progressing   Problem: Health Behavior/Discharge Planning: Goal: Ability to identify changes in lifestyle to reduce recurrence of condition will improve Outcome: Progressing Goal: Identification of resources available to assist in meeting health care needs will improve Outcome: Progressing   Problem: Physical Regulation: Goal: Complications related to the disease process, condition or treatment will be avoided or minimized Outcome: Progressing   Problem: Safety: Goal: Ability to remain free from injury will improve Outcome: Progressing   Problem:  Education: Goal: Ability to make informed decisions regarding treatment will improve Outcome: Progressing   Problem: Coping: Goal: Coping ability will improve Outcome: Progressing   Problem: Health Behavior/Discharge Planning: Goal: Identification of resources available to assist in meeting health care needs will improve Outcome: Progressing   Problem: Medication: Goal: Compliance with prescribed medication regimen will improve Outcome: Progressing   Problem: Self-Concept: Goal: Ability to disclose and discuss suicidal ideas will improve Outcome: Progressing Goal: Will verbalize positive feelings about self Outcome: Progressing   Problem: Education: Goal: Ability to state activities that reduce stress will improve Outcome: Progressing   Problem: Coping: Goal: Ability to identify and develop effective coping behavior will improve Outcome: Progressing   Problem: Self-Concept: Goal: Ability to identify factors that promote anxiety will improve Outcome: Progressing Goal: Level of anxiety will decrease Outcome: Progressing Goal: Ability to modify response to factors that promote anxiety will improve Outcome: Progressing   Problem: Education: Goal: Utilization of techniques to improve thought processes will improve Outcome: Progressing Goal: Knowledge of the prescribed therapeutic regimen will improve Outcome: Progressing   Problem: Activity: Goal: Interest or engagement in leisure activities will improve Outcome: Progressing Goal: Imbalance in normal sleep/wake cycle will improve Outcome: Progressing   Problem: Coping: Goal: Coping ability will improve Outcome: Progressing Goal: Will verbalize feelings Outcome: Progressing   Problem: Health Behavior/Discharge Planning: Goal: Ability to make decisions will improve Outcome: Progressing Goal: Compliance with therapeutic regimen will improve Outcome: Progressing   Problem: Role Relationship: Goal: Will  demonstrate positive changes in social behaviors and relationships Outcome: Progressing   Problem: Safety: Goal: Ability to disclose and discuss suicidal ideas will improve Outcome: Progressing Goal: Ability to identify and utilize support systems that promote safety will improve Outcome: Progressing   Problem: Self-Concept: Goal: Will verbalize positive feelings about self Outcome: Progressing Goal: Level of anxiety will decrease Outcome: Progressing

## 2022-09-19 NOTE — ED Provider Notes (Signed)
[facility] EMERGENCY DEPARTMENT Provider Note   CSN: [id number] Arrival date & time: 09/19/22  0410     History  Chief Complaint  Patient presents with   Suicidal    John Vaughan is a 31 y.o. male.  The history is provided by the patient and medical records.   31 year old male with history of alcohol abuse, presenting to the ED for suicidal ideation and is also requesting detox from alcohol.  States he has been drinking for approx 20 years-- varies daily but always fairly heavily.  States when he doesn't drink for a day or 2 he will have withdrawal seizures.  Last drink was approx 6 hours PTA.  He also reports cocaine use.  Thoughts of SI, plan to cut himself which he has done before.  Denies HI.  Admits that he feels some hallucinations starting.  Home Medications Prior to Admission medications   Medication Sig Start Date End Date Taking? Authorizing Provider  cyanocobalamin 1000 MCG tablet Take 1 tablet (1,000 mcg total) by mouth daily. 08/28/22   Willette Cluster, MD  DULoxetine (CYMBALTA) 60 MG capsule Take 1 capsule (60 mg total) by mouth daily. 08/28/22   Willette Cluster, MD  gabapentin (NEURONTIN) 300 MG capsule Take 1 capsule (300 mg total) by mouth 3 (three) times daily. 08/28/22   Willette Cluster, MD  hydrOXYzine (ATARAX) 25 MG tablet Take 2 tablets (50 mg total) by mouth every 6 (six) hours as needed for anxiety. 08/28/22   Willette Cluster, MD  levETIRAcetam (KEPPRA) 500 MG tablet Take 1 tablet (500 mg total) by mouth 2 (two) times daily. 08/28/22   Willette Cluster, MD  pantoprazole (PROTONIX) 40 MG tablet Take 1 tablet (40 mg total) by mouth daily. Patient taking differently: Take 20 mg by mouth daily. 06/16/22   Comer Locket, MD  thiamine (VITAMIN B1) 100 MG tablet Take 1 tablet (100 mg total) by mouth daily with supper. 08/28/22   Willette Cluster, MD  traZODone (DESYREL) 100 MG tablet Take 1 tablet (100 mg total) by mouth at bedtime as needed for sleep. 08/28/22    Willette Cluster, MD      Allergies    Patient has no known allergies.    Review of Systems   Review of Systems  Psychiatric/Behavioral:  Positive for suicidal ideas.   All other systems reviewed and are negative.   Physical Exam Updated Vital Signs BP 121/85 (BP Location: Right Arm)   Pulse 89   Temp 98 F (36.7 C) (Oral)   Resp 18   SpO2 95%   Physical Exam Vitals and nursing note reviewed.  Constitutional:      Appearance: He is well-developed.     Comments: Disheveled appearing  HENT:     Head: Normocephalic and atraumatic.     Mouth/Throat:     Comments: Breath smells of EtOH Eyes:     Conjunctiva/sclera: Conjunctivae normal.     Pupils: Pupils are equal, round, and reactive to light.  Cardiovascular:     Rate and Rhythm: Normal rate and regular rhythm.     Heart sounds: Normal heart sounds.  Pulmonary:     Effort: Pulmonary effort is normal.     Breath sounds: Normal breath sounds.  Abdominal:     General: Bowel sounds are normal.     Palpations: Abdomen is soft.  Musculoskeletal:        General: Normal range of motion.     Cervical back: Normal range of motion.  Skin:  General: Skin is warm and dry.  Neurological:     Mental Status: He is alert and oriented to person, place, and time.     ED Results / Procedures / Treatments   Labs (all labs ordered are listed, but only abnormal results are displayed) Labs Reviewed  ETHANOL - Abnormal; Notable for the following components:      Result Value   Alcohol, Ethyl (B) 346 (*)    All other components within normal limits  RAPID URINE DRUG SCREEN, HOSP PERFORMED - Abnormal; Notable for the following components:   Cocaine POSITIVE (*)    All other components within normal limits  SALICYLATE LEVEL - Abnormal; Notable for the following components:   Salicylate Lvl <4.4 (*)    All other components within normal limits  ACETAMINOPHEN LEVEL - Abnormal; Notable for the following components:   Acetaminophen  (Tylenol), Serum <10 (*)    All other components within normal limits  RESP PANEL BY RT-PCR (FLU A&B, COVID) ARPGX2  CBC WITH DIFFERENTIAL/PLATELET  COMPREHENSIVE METABOLIC PANEL  `  EKG None  Radiology No results found.  Procedures Procedures    Medications Ordered in ED Medications - No data to display  ED Course/ Medical Decision Making/ A&P                           Medical Decision Making Amount and/or Complexity of Data Reviewed Labs: ordered. ECG/medicine tests: ordered and independent interpretation performed.  Risk OTC drugs. Prescription drug management.   31 year old male presenting to the ED with suicide ideation.  Also requesting detox from alcohol.  Heavy drinker for reportedly 20 years, last drink 6 hours prior to arrival.  Does report he is starting to experience some hallucinations.  Will check screening labs, placed on CIWA protocol.    Labs as above--no leukocytosis, no electrolyte derangement.  Ethanol is 346.  Negative APAP and salicylate levels.  UDS + for cocaine.  Medically cleared.  Will remain on CIWA protocol.  Plan for TTS consult.    5:44 AM Patient began sneezing 20+ times in triage.  States allergies have been giving him trouble.  Ordered dose of claritin.  Taken to acute bed.  Awaiting TTS.  Final Clinical Impression(s) / ED Diagnoses Final diagnoses:  Suicidal ideation  Alcohol abuse    Rx / DC Orders ED Discharge Orders     None         Larene Pickett, PA-C 09/19/22 0102    Ripley Fraise, MD 09/19/22 [phone]

## 2022-09-19 NOTE — ED Notes (Signed)
TC to safe transport . Had to leave a message for return call.

## 2022-09-19 NOTE — ED Notes (Signed)
Report given to Felipa Eth, RN of [facility]
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
## 2022-09-19 NOTE — ED Notes (Signed)
Faxed voluntary consent because 1st one failed. Transport called

## 2022-09-19 NOTE — Group Note (Deleted)
Recreation Therapy Group Note   Group Topic:Other  Group Date: 09/19/2022 Start Time: 1405 End Time: 1430 Facilitators: Zaul Hubers-McCall, Donold Marotto A, NT Location: [address] Dayroom       Affect/Mood: {RT BHH Affect/Mood:26271}   Participation Level: {RT BHH Participation [employer]   Participation Quality: {RT BHH Participation Quality:26268}   Behavior: {RT BHH Group Behavior:26269}   Speech/Thought Process: {RT BHH Speech/Thought:26276}   Insight: {RT BHH Insight:26272}   Judgement: {RT BHH Judgement:26278}   Modes of Intervention: {RT BHH Modes of Intervention:26277}   Patient Response to Interventions:  {RT BHH Patient Response to Intervention:26274}   Education Outcome:  {RT [place] Education Outcome:26279}   Clinical Observations/Individualized Feedback: *** was *** in their participation of session activities and group discussion. Pt identified ***   Plan: {RT BHH Tx Plan:26280}   Virginio Isidore A Gracee Ratterree-McCall, NT,  09/19/2022 3:56 PM

## 2022-09-19 NOTE — Tx Team (Signed)
Initial Treatment Plan 09/19/2022 5:17 PM [name] OAC:[id number]    PATIENT STRESSORS: Financial difficulties   Substance abuse     PATIENT STRENGTHS: Ability for insight  Motivation for treatment/growth  Supportive family/friends    PATIENT IDENTIFIED PROBLEMS:   Substance Abuse    Anxiety    Depression    Suicidal Ideation       DISCHARGE CRITERIA:  Improved stabilization in mood, thinking, and/or behavior Motivation to continue treatment in a less acute level of care Reduction of life-threatening or endangering symptoms to within safe limits Verbal commitment to aftercare and medication compliance Withdrawal symptoms are absent or subacute and managed without 24-hour nursing intervention  PRELIMINARY DISCHARGE PLAN: Return to previous living arrangement  PATIENT/FAMILY INVOLVEMENT: This treatment plan has been presented to and reviewed with the patient, Raymund Manrique. The patient has been given the opportunity to ask questions and make suggestions.  Lurline Del Griffey Nicasio, RN 09/19/2022, 5:17 PM
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
## 2022-09-19 NOTE — Progress Notes (Signed)
Pt came to the nursing station complaining of withdrawal Sx, pt said he felt like he was going to have a seizure , pt given PRN Ativan per [facility] , will continue to monitor
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
## 2022-09-19 NOTE — ED Notes (Signed)
Pt signed Voluntary Admission and consent for treatment form. [place] adult dept called and updated staff. This Probation officer faxed Form to [facility].

## 2022-09-19 NOTE — ED Provider Notes (Signed)
Patient awaiting [facility] bed placement suicidal ideations.  Physical Exam  BP (!) 90/49   Pulse 82   Temp 97.6 F (36.4 C) (Oral)   Resp 14   SpO2 93%   Physical Exam Vitals and nursing note reviewed.  Constitutional:      General: He is not in acute distress.    Appearance: He is not ill-appearing.  HENT:     Head: Normocephalic.  Eyes:     Pupils: Pupils are equal, round, and reactive to light.  Cardiovascular:     Rate and Rhythm: Normal rate and regular rhythm.     Pulses: Normal pulses.     Heart sounds: Normal heart sounds. No murmur heard.    No friction rub. No gallop.  Pulmonary:     Effort: Pulmonary effort is normal.     Breath sounds: Normal breath sounds.  Abdominal:     General: Abdomen is flat. There is no distension.     Palpations: Abdomen is soft.     Tenderness: There is no abdominal tenderness. There is no guarding or rebound.  Musculoskeletal:        General: Normal range of motion.     Cervical back: Neck supple.  Skin:    General: Skin is warm and dry.  Neurological:     General: No focal deficit present.     Mental Status: He is alert.  Psychiatric:        Mood and Affect: Mood normal.        Behavior: Behavior normal.     Procedures  Procedures  ED Course / MDM    Medical Decision Making Amount and/or Complexity of Data Reviewed External Data Reviewed: notes. Labs: ordered. Decision-making details documented in ED Course.  Risk OTC drugs. Prescription drug management.   Informed by Shelton Silvas, LCSW that patient has been accepted to [facility] today at 1300.  Assessed patient at bedside.  Patient denies any physical complaints.  He admits to some anxiety.  Patient given 3mg  ativan 1 hour ago. Patient denies current SI, HI, and auditory/visual hallucinations. BP soft; however appears to be around patient's baseline. Patient given po fluids. Patient admitted to [facility].     Karie Kirks 09/19/22 1147    Fransico Meadow,  MD 09/19/22 [phone]

## 2022-09-20 MED ORDER — CHLORDIAZEPOXIDE HCL 25 MG PO CAPS
25.0000 mg | ORAL_CAPSULE | Freq: Three times a day (TID) | ORAL | Status: AC
  Administered 2022-09-21 (×3): 25 mg via ORAL
  Filled 2022-09-20 (×3): qty 1

## 2022-09-20 MED ORDER — VITAMIN B-12 1000 MCG PO TABS
1000.0000 ug | ORAL_TABLET | Freq: Every day | ORAL | Status: DC
  Administered 2022-09-20 – 2022-09-25 (×6): 1000 ug via ORAL
  Filled 2022-09-20 (×3): qty 1
  Filled 2022-09-20: qty 14
  Filled 2022-09-20 (×4): qty 1

## 2022-09-20 MED ORDER — METHOCARBAMOL 500 MG PO TABS: 500.0000 mg | ORAL_TABLET | Freq: Three times a day (TID) | ORAL | Status: AC | PRN

## 2022-09-20 MED ORDER — MIRTAZAPINE 7.5 MG PO TABS
7.5000 mg | ORAL_TABLET | Freq: Every day | ORAL | Status: DC
  Administered 2022-09-20 – 2022-09-24 (×5): 7.5 mg via ORAL
  Filled 2022-09-20 (×2): qty 1
  Filled 2022-09-20: qty 14
  Filled 2022-09-20 (×3): qty 1

## 2022-09-20 MED ORDER — CHLORDIAZEPOXIDE HCL 25 MG PO CAPS
25.0000 mg | ORAL_CAPSULE | ORAL | Status: AC
  Administered 2022-09-22 (×2): 25 mg via ORAL
  Filled 2022-09-20 (×2): qty 1

## 2022-09-20 MED ORDER — CHLORDIAZEPOXIDE HCL 25 MG PO CAPS: 25.0000 mg | ORAL_CAPSULE | Freq: Four times a day (QID) | ORAL | Status: AC | PRN

## 2022-09-20 MED ORDER — GABAPENTIN 300 MG PO CAPS
300.0000 mg | ORAL_CAPSULE | Freq: Three times a day (TID) | ORAL | Status: DC
  Administered 2022-09-20 – 2022-09-23 (×10): 300 mg via ORAL
  Filled 2022-09-20 (×13): qty 1

## 2022-09-20 MED ORDER — TRAZODONE HCL 100 MG PO TABS
100.0000 mg | ORAL_TABLET | Freq: Every evening | ORAL | Status: DC | PRN
  Administered 2022-09-21 – 2022-09-24 (×4): 100 mg via ORAL
  Filled 2022-09-20 (×3): qty 1
  Filled 2022-09-20 (×2): qty 14
  Filled 2022-09-20: qty 1

## 2022-09-20 MED ORDER — CHLORDIAZEPOXIDE HCL 25 MG PO CAPS
25.0000 mg | ORAL_CAPSULE | Freq: Every day | ORAL | Status: AC
  Administered 2022-09-23: 25 mg via ORAL
  Filled 2022-09-20: qty 1

## 2022-09-20 MED ORDER — CHLORDIAZEPOXIDE HCL 25 MG PO CAPS
25.0000 mg | ORAL_CAPSULE | Freq: Four times a day (QID) | ORAL | Status: AC
  Administered 2022-09-20 (×4): 25 mg via ORAL
  Filled 2022-09-20 (×4): qty 1

## 2022-09-20 MED ORDER — LEVETIRACETAM 500 MG PO TABS
500.0000 mg | ORAL_TABLET | Freq: Two times a day (BID) | ORAL | Status: DC
  Administered 2022-09-20 – 2022-09-25 (×10): 500 mg via ORAL
  Filled 2022-09-20 (×5): qty 1
  Filled 2022-09-20: qty 28
  Filled 2022-09-20: qty 1
  Filled 2022-09-20: qty 28
  Filled 2022-09-20 (×4): qty 1

## 2022-09-20 MED ORDER — PANTOPRAZOLE SODIUM 40 MG PO TBEC
40.0000 mg | DELAYED_RELEASE_TABLET | Freq: Every day | ORAL | Status: DC
  Administered 2022-09-20 – 2022-09-25 (×6): 40 mg via ORAL
  Filled 2022-09-20 (×6): qty 1
  Filled 2022-09-20: qty 14
  Filled 2022-09-20: qty 1

## 2022-09-20 MED ORDER — DULOXETINE HCL 60 MG PO CPEP
60.0000 mg | ORAL_CAPSULE | Freq: Every day | ORAL | Status: DC
  Administered 2022-09-20 – 2022-09-25 (×6): 60 mg via ORAL
  Filled 2022-09-20 (×8): qty 1
  Filled 2022-09-20: qty 14

## 2022-09-20 MED ORDER — DICYCLOMINE HCL 20 MG PO TABS: 20.0000 mg | ORAL_TABLET | Freq: Four times a day (QID) | ORAL | Status: AC | PRN

## 2022-09-20 MED ORDER — HYDROXYZINE HCL 50 MG PO TABS
50.0000 mg | ORAL_TABLET | Freq: Four times a day (QID) | ORAL | Status: AC | PRN
  Administered 2022-09-20 – 2022-09-22 (×4): 50 mg via ORAL
  Filled 2022-09-20 (×4): qty 1

## 2022-09-20 MED ORDER — NAPROXEN 500 MG PO TABS: 500.0000 mg | ORAL_TABLET | Freq: Two times a day (BID) | ORAL | Status: AC | PRN

## 2022-09-20 NOTE — Progress Notes (Signed)
   09/20/22 2015  Psych Admission Type (Psych Patients Only)  Admission Status Voluntary  Psychosocial Assessment  Patient Complaints Substance abuse  Eye Contact Fair  Facial Expression Anxious  Affect Anxious;Preoccupied  Speech Logical/coherent  Interaction Cautious  Motor Activity Slow;Tremors  Appearance/Hygiene Disheveled  Behavior Characteristics Anxious  Mood Anxious;Depressed  Aggressive Behavior  Effect No apparent injury  Thought Process  Coherency Circumstantial  Content Blaming self  Delusions Paranoid  Perception Hallucinations  Hallucination Auditory;Visual  Judgment Impaired  Confusion WDL  Danger to Self  Current suicidal ideation? Passive  Self-Injurious Behavior Some self-injurious ideation observed or expressed.  No lethal plan expressed   Agreement Not to Harm Self Yes  Description of Agreement verbal contract for safety  Danger to Others  Danger to Others None reported or observed

## 2022-09-20 NOTE — Progress Notes (Signed)
Patient appears irritable. Patient denies HI/VH. Pt endorses passive SI "the detox is so bad I just want to die". Pt endorses auditory hallucinations last night of people talking who were not there. Pt goal for the day is "IDK". Patient complied with morning medication with no reported side effects. Pt reports depression is an 8/10 and anxiety is a 10/10. Pt reports poor sleep and poor appetite. Pt stated he could not feel his legs, but did not feel like he was going to fall. Pt was observed walking safely on the unit. Pt endorses nausea but refused antiemetic medication. Patient remains safe on Q74min checks and contracts for safety.       09/20/22 1001  Psych Admission Type (Psych Patients Only)  Admission Status Voluntary  Psychosocial Assessment  Patient Complaints Substance abuse;Self-harm thoughts  Eye Contact Fair  Facial Expression Anxious  Affect Anxious;Preoccupied  Speech Logical/coherent  Interaction Cautious  Motor Activity Slow;Tremors  Appearance/Hygiene Disheveled  Behavior Characteristics Anxious  Mood Anxious;Depressed  Thought Process  Coherency WDL  Content Blaming self  Delusions Paranoid  Perception Hallucinations  Hallucination Auditory  Judgment Impaired  Confusion None  Danger to Self  Current suicidal ideation? Passive  Self-Injurious Behavior Some self-injurious ideation observed or expressed.  No lethal plan expressed   Agreement Not to Harm Self Yes  Description of Agreement verbal  Danger to Others  Danger to Others None reported or observed

## 2022-09-20 NOTE — Progress Notes (Signed)
Psychoeducational Group Note  Date:  09/20/2022 Time:  2033  Group Topic/Focus:  Wrap-Up Group:   The focus of this group is to help patients review their daily goal of treatment and discuss progress on daily workbooks.  Participation Level: Did Not Attend  Participation Quality:  Not Applicable  Affect:  Not Applicable  Cognitive:  Not Applicable  Insight:  Not Applicable  Engagement in Group: Not Applicable  Additional Comments:  The patient did not attend group this evening.   Archie Balboa S 09/20/2022, 8:33 PM
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
## 2022-09-20 NOTE — Plan of Care (Signed)
  Problem: Education: Goal: Knowledge of [facility] Education information/materials will improve Outcome: Progressing Goal: Emotional status will improve Outcome: Progressing Goal: Mental status will improve Outcome: Progressing Goal: Verbalization of understanding the information provided will improve Outcome: Progressing   Problem: Activity: Goal: Interest or engagement in activities will improve Outcome: Progressing Goal: Sleeping patterns will improve Outcome: Progressing   Problem: Coping: Goal: Ability to verbalize frustrations and anger appropriately will improve Outcome: Progressing Goal: Ability to demonstrate self-control will improve Outcome: Progressing   Problem: Health Behavior/Discharge Planning: Goal: Identification of resources available to assist in meeting health care needs will improve Outcome: Progressing Goal: Compliance with treatment plan for underlying cause of condition will improve Outcome: Progressing   Problem: Physical Regulation: Goal: Ability to maintain clinical measurements within normal limits will improve Outcome: Progressing   Problem: Safety: Goal: Periods of time without injury will increase Outcome: Progressing   Problem: Education: Goal: Knowledge of disease or condition will improve Outcome: Progressing Goal: Understanding of discharge needs will improve Outcome: Progressing   Problem: Health Behavior/Discharge Planning: Goal: Ability to identify changes in lifestyle to reduce recurrence of condition will improve Outcome: Progressing Goal: Identification of resources available to assist in meeting health care needs will improve Outcome: Progressing   Problem: Physical Regulation: Goal: Complications related to the disease process, condition or treatment will be avoided or minimized Outcome: Progressing   Problem: Safety: Goal: Ability to remain free from injury will improve Outcome: Progressing   Problem:  Education: Goal: Ability to make informed decisions regarding treatment will improve Outcome: Progressing   Problem: Coping: Goal: Coping ability will improve Outcome: Progressing   Problem: Health Behavior/Discharge Planning: Goal: Identification of resources available to assist in meeting health care needs will improve Outcome: Progressing   Problem: Medication: Goal: Compliance with prescribed medication regimen will improve Outcome: Progressing   Problem: Self-Concept: Goal: Ability to disclose and discuss suicidal ideas will improve Outcome: Progressing Goal: Will verbalize positive feelings about self Outcome: Progressing   Problem: Education: Goal: Ability to state activities that reduce stress will improve Outcome: Progressing   Problem: Coping: Goal: Ability to identify and develop effective coping behavior will improve Outcome: Progressing   Problem: Self-Concept: Goal: Ability to identify factors that promote anxiety will improve Outcome: Progressing Goal: Level of anxiety will decrease Outcome: Progressing Goal: Ability to modify response to factors that promote anxiety will improve Outcome: Progressing   Problem: Education: Goal: Utilization of techniques to improve thought processes will improve Outcome: Progressing Goal: Knowledge of the prescribed therapeutic regimen will improve Outcome: Progressing   Problem: Activity: Goal: Interest or engagement in leisure activities will improve Outcome: Progressing Goal: Imbalance in normal sleep/wake cycle will improve Outcome: Progressing   Problem: Coping: Goal: Coping ability will improve Outcome: Progressing Goal: Will verbalize feelings Outcome: Progressing   Problem: Health Behavior/Discharge Planning: Goal: Ability to make decisions will improve Outcome: Progressing Goal: Compliance with therapeutic regimen will improve Outcome: Progressing   Problem: Role Relationship: Goal: Will  demonstrate positive changes in social behaviors and relationships Outcome: Progressing   Problem: Safety: Goal: Ability to disclose and discuss suicidal ideas will improve Outcome: Progressing Goal: Ability to identify and utilize support systems that promote safety will improve Outcome: Progressing   Problem: Self-Concept: Goal: Will verbalize positive feelings about self Outcome: Progressing Goal: Level of anxiety will decrease Outcome: Progressing   

## 2022-09-20 NOTE — Group Note (Signed)
Date:  09/20/2022 Time:  12:02 PM  Group Topic/Focus:  Orientation:   The focus of this group is to educate the patient on the purpose and policies of crisis stabilization and provide a format to answer questions about their admission.  The group details unit policies and expectations of patients while admitted.    Participation Level:  Active  Participation Quality:  Appropriate  Affect:  Appropriate  Cognitive:  Appropriate  Insight: Appropriate  Engagement in Group:  Engaged  Modes of Intervention:  Discussion  Additional Comments:     Jerrye Beavers 09/20/2022, 12:02 PM

## 2022-09-20 NOTE — [facility] Suicide Risk Assessment (Signed)
[place] INPATIENT:  Family/Significant Other Suicide Prevention Education  Suicide Prevention Education:  Education Completed; Jameel Quant [phone] (Mother) has been identified by the patient as the family member/significant other with whom the patient will be residing, and identified as the person(s) who will aid the patient in the event of a mental health crisis (suicidal ideations/suicide attempt).  With written consent from the patient, the family member/significant other has been provided the following suicide prevention education, prior to the and/or following the discharge of the patient.  The suicide prevention education provided includes the following: Suicide risk factors Suicide prevention and interventions National Suicide Hotline telephone number [facility] assessment telephone number [facility] Emergency Assistance [place] and/or Residential Mobile Crisis Unit telephone number  Request made of family/significant other to: Remove weapons (e.g., guns, rifles, knives), all items previously/currently identified as safety concern.   Remove drugs/medications (over-the-counter, prescriptions, illicit drugs), all items previously/currently identified as a safety concern.  The family member/significant other verbalizes understanding of the suicide prevention education information provided.  The family member/significant other agrees to remove the items of safety concern listed above.  CSW spoke with Mrs. Gerilyn Nestle who states that her and her son have been under a lot of pressure recently.  She states that her son gets angry, verbally abusive, and his mood changes quickly.  She states that he has been drinking alcohol "he drinks as much as he can get".  She states that she recently moved her sister and her sister's boyfriend into her home and they have been buying her son alcohol.  She states that she would like her son to go to a 30-day rehab prior to coming  back home.  Mrs. Turay also states that if her son does not go to the rehab he can still come back to her home.  She states that there are no firearms in the home and that she has hidden all of the knives.  She also states that one of her son's stressors is that their current home is in foreclosure and that they will have to be moving soon.  She states that she would be grateful for any resources that can be provided.  CSW completed SPE with Mrs. Gerilyn Nestle.   Frutoso Chase Edward Trevino 09/20/2022, 1:16 PM

## 2022-09-20 NOTE — Progress Notes (Signed)
   09/20/22 0530  Sleep  Number of Hours 8.5

## 2022-09-21 DIAGNOSIS — F332 Major depressive disorder, recurrent severe without psychotic features: Secondary | ICD-10-CM

## 2022-09-21 NOTE — Group Note (Signed)
LCSW Group Therapy Note   Group Date: 09/21/2022 Start Time: 1300 End Time: 1400  Type of Therapy and Topic:  Group Therapy:  Stress Management   Participation Level:  Active    Description of Group:  Patients in this group were introduced to the idea of stress and encouraged to discuss negative and positive ways to manage stress. Patients discussed specific stressors that they have in their life right now and the physical signs and symptoms associated with that stress.  Patient encouraged to come up with positive changes to assist with the stress upon discharge in order to prevent future hospitalizations.   They also worked as a group on developing a specific plan for several patients to deal with stressors through boundary-setting, psychoeducation and self-care techniques   Therapeutic Goals:               1)  To discuss the positive and negative impacts of stress             2)  identify signs and symptoms of stress             3)  generate ideas for stress management             4)  offer mutual support to others regarding stress management             5)  Developing plans for ways to manage specific stressors upon discharge               Summary of Patient Progress: The Pt attended group and remained there the entire time.  The Pt accepted all worksheets and participated in the group discussion.  The Pt was appropriate with their peers and was able to share examples of stress in their own life.  The Pt was able to find new ways of managing their stress and having a different perspective about things that cause them stress.    Therapeutic Modalities:   Motivational Interviewing Brief Solution-Focused Therapy  Imunique Samad M Alea Ryer, LCSWA 09/21/2022  1:56 PM    

## 2022-09-21 NOTE — Progress Notes (Signed)
[facility]  09/21/2022 8:14 AM John Vaughan  MRN:  [id number]  Chief Complaint: SI and worsening depression in context of substance abuse  Reason for Admission:  The patient is a 31y/o male with past psychiatric h/o MDD vs bipolar II vs substance induced mood d/o, alcohol and cocaine abuse, and GAD, who [facility] with thoughts to cut his wrist and worsening depression. His ETOH on admission was 346 and his UDS was positive for cocaine.The patient is currently on Hospital Day 2.   Chart Review from last 24 hours:  The patient's chart was reviewed and nursing notes were reviewed. The patient's case was discussed in multidisciplinary team meeting. Per nursing he had passive SI last night but contracted for safety. He attended select groups and had no acute behavioral issues noted. [facility] he was compliant with scheduled medications and did receive Vistaril X2 for anxiety PRN. He did not require additional meds over Librium scheduled taper for withdrawal.   Information Obtained Today During Patient Interview: The patient was seen and evaluated on the unit. On assessment today the patient reports that he is feeling better since transitioning to Librium scheduled taper and off Ativan taper. He states he still has some sweats, occasional tremors, dizziness, and nausea but his withdrawal symptoms are much improved compared to yesterday. He was able to eat today and reports improved appetite. He states his sleep was fair with frequent awakenings. He states he is more hopeful and feels his depression is improving. He is interested in a 30 day residential rehab. He is more forward thinking and states after rehab he hopes to find housing where he is not tempted to use and to find a job. He denies SI, HI, AVH, paranoia, or delusions. He denies first rank symptoms or ideas of reference.   Principal Problem: MDD (major depressive disorder), recurrent episode  (HCC) Diagnosis: Principal Problem:   MDD (major depressive disorder), recurrent episode (HCC) Active Problems:   GAD (generalized anxiety disorder)   Alcohol use disorder, severe, dependence (HCC)   Alcohol withdrawal (HCC)   Suicidal ideation   Tobacco abuse   Cocaine abuse (HCC)   Substance-induced disorder (HCC)  Total Time Spent in Direct Patient Care:  I personally spent 30 minutes on the unit in direct patient care. The direct patient care time included face-to-face time with the patient, reviewing the patient's chart, communicating with other professionals, and coordinating care. Greater than 50% of this time was spent in counseling or coordinating care with the patient regarding goals of hospitalization, psycho-education, and discharge planning needs.  Past Psychiatric History: see H&P  Past Medical History:  Past Medical History:  Diagnosis Date   Alcohol abuse    Anxiety    Bipolar 2 disorder (HCC)    Depression    Seizures (HCC)     Past Surgical History:  Procedure Laterality Date   HAND SURGERY Right    Family History:  Family History  Problem Relation Age of Onset   Anxiety disorder Mother    Alcohol abuse Father    Anxiety disorder Maternal Aunt    Anxiety disorder Maternal Grandmother    Alcohol abuse Paternal Grandfather    Family Psychiatric  History: see H&P  Social History:  Social History   Substance and Sexual Activity  Alcohol Use Yes   Alcohol/week: 2.0 standard drinks of alcohol   Types: 2 Cans of beer per week   Comment: 48 beers a week  Social History   Substance and Sexual Activity  Drug Use Yes   Types: Marijuana, Cocaine, "Crack" cocaine   Comment: cocaine in last month    Social History   Socioeconomic History   Marital status: Single    Spouse name: Not on file   Number of children: Not on file   Years of education: Not on file   Highest education level: Not on file  Occupational History   Not on file  Tobacco Use    Smoking status: Every Day    Packs/day: 1.50    Types: Cigarettes   Smokeless tobacco: Former    Types: Snuff   Tobacco comments:    Pt states plans to stop drinking. Will deal with tobacco cessation later.  Vaping Use   Vaping Use: Never used  Substance and Sexual Activity   Alcohol use: Yes    Alcohol/week: 2.0 standard drinks of alcohol    Types: 2 Cans of beer per week    Comment: 48 beers a week   Drug use: Yes    Types: Marijuana, Cocaine, "Crack" cocaine    Comment: cocaine in last month   Sexual activity: Yes  Other Topics Concern   Not on file  Social History Narrative   Not on file   Social Determinants of Health   Financial Resource Strain: Not on file  Food Insecurity: No Food Insecurity (09/19/2022)   Hunger Vital Sign    Worried About Running Out of Food in the Last Year: Never true    Ran Out of Food in the Last Year: Never true  Transportation Needs: No Transportation Needs (09/19/2022)   PRAPARE - Hydrologist (Medical): No    Lack of Transportation (Non-Medical): No  Physical Activity: Not on file  Stress: Not on file  Social Connections: Not on file   Sleep: Fair per patient report - 8 hours documented  Appetite:  Fair  Current Medications: Current Facility-Administered Medications  Medication Dose Route Frequency Provider Last Rate Last Admin   acetaminophen (TYLENOL) tablet 650 mg  650 mg Oral Q6H PRN Derrill Center, NP       alum & mag hydroxide-simeth (MAALOX/MYLANTA) 200-200-20 MG/5ML suspension 30 mL  30 mL Oral Q4H PRN Derrill Center, NP       busPIRone (BUSPAR) tablet 5 mg  5 mg Oral BID Derrill Center, NP   5 mg at 09/20/22 1656   chlordiazePOXIDE (LIBRIUM) capsule 25 mg  25 mg Oral Q6H PRN Harlow Asa, MD       chlordiazePOXIDE (LIBRIUM) capsule 25 mg  25 mg Oral TID Harlow Asa, MD       Followed by   Derrill Memo ON 09/22/2022] chlordiazePOXIDE (LIBRIUM) capsule 25 mg  25 mg Oral Dollene Cleveland,  Dornell Grasmick E, MD       Followed by   Derrill Memo ON 09/23/2022] chlordiazePOXIDE (LIBRIUM) capsule 25 mg  25 mg Oral Daily Mohannad Olivero E, MD       cyanocobalamin (VITAMIN B12) tablet 1,000 mcg  1,000 mcg Oral Daily Nelda Marseille, Verina Galeno E, MD   1,000 mcg at 09/20/22 0818   dicyclomine (BENTYL) tablet 20 mg  20 mg Oral Q6H PRN Nelda Marseille, Kayelee Herbig E, MD       DULoxetine (CYMBALTA) DR capsule 60 mg  60 mg Oral Daily Viann Fish E, MD   60 mg at 09/20/22 0813   gabapentin (NEURONTIN) capsule 300 mg  300 mg Oral TID Harlow Asa, MD  300 mg at 09/20/22 1656   hydrOXYzine (ATARAX) tablet 50 mg  50 mg Oral Q6H PRN Comer Locket, MD   50 mg at 09/20/22 2148   levETIRAcetam (KEPPRA) tablet 500 mg  500 mg Oral BID Bartholomew Crews E, MD   500 mg at 09/20/22 1656   loperamide (IMODIUM) capsule 2-4 mg  2-4 mg Oral PRN Oneta Rack, NP       magnesium hydroxide (MILK OF MAGNESIA) suspension 30 mL  30 mL Oral Daily PRN Oneta Rack, NP       methocarbamol (ROBAXIN) tablet 500 mg  500 mg Oral Q8H PRN Mason Jim, Khadir Roam E, MD       mirtazapine (REMERON) tablet 7.5 mg  7.5 mg Oral QHS Petrice Beedy E, MD   7.5 mg at 09/20/22 2148   multivitamin with minerals tablet 1 tablet  1 tablet Oral Daily Oneta Rack, NP   1 tablet at 09/20/22 0813   naproxen (NAPROSYN) tablet 500 mg  500 mg Oral BID PRN Comer Locket, MD       nicotine (NICODERM CQ - dosed in mg/24 hours) patch 14 mg  14 mg Transdermal Daily Mason Jim, Nubia Ziesmer E, MD   14 mg at 09/20/22 0812   ondansetron (ZOFRAN-ODT) disintegrating tablet 4 mg  4 mg Oral Q6H PRN Oneta Rack, NP       pantoprazole (PROTONIX) EC tablet 40 mg  40 mg Oral Daily Mason Jim, Shanee Batch E, MD   40 mg at 09/20/22 0818   thiamine (Vitamin B-1) tablet 100 mg  100 mg Oral Daily Oneta Rack, NP   100 mg at 09/20/22 3244   traZODone (DESYREL) tablet 100 mg  100 mg Oral QHS PRN Comer Locket, MD        Lab Results: No results found for this or any previous visit (from the past 48  hour(s)).  Blood Alcohol level:  Lab Results  Component Value Date   ETH 346 (HH) 09/19/2022   ETH 249 (H) 08/25/2022    Metabolic Disorder Labs: Lab Results  Component Value Date   HGBA1C 5.4 05/14/2022   MPG 108.28 05/14/2022   MPG 102.54 10/25/2021   No results found for: "PROLACTIN" Lab Results  Component Value Date   CHOL 196 05/14/2022   TRIG 93 05/14/2022   HDL 57 05/14/2022   CHOLHDL 3.4 05/14/2022   VLDL 19 05/14/2022   LDLCALC 120 (H) 05/14/2022   LDLCALC 93 10/25/2021    Physical Findings: AIMS: Facial and Oral Movements Muscles of Facial Expression: None, normal Lips and Perioral Area: None, normal Jaw: None, normal Tongue: None, normal,Extremity Movements Upper (arms, wrists, hands, fingers): None, normal Lower (legs, knees, ankles, toes): None, normal, Trunk Movements Neck, shoulders, hips: None, normal, Overall Severity Severity of abnormal movements (highest score from questions above): None, normal Incapacitation due to abnormal movements: None, normal Patient's awareness of abnormal movements (rate only patient's report): No Awareness, Dental Status Current problems with teeth and/or dentures?: No Does patient usually wear dentures?: No  CIWA:  CIWA-Ar Total: 3  Musculoskeletal: Strength & Muscle Tone: within normal limits Gait & Station: normal Patient leans: N/A  Psychiatric Specialty Exam:  Presentation  General Appearance: appears to have showered with improved grooming and casually dressed  Eye Contact:Good  Speech:Clear and Coherent  Speech Volume:Normal  Mood and Affect  Mood:dysphoric but described as improving  Affect:constricted   Thought Process  Thought Processes:Linear, goal directed  Descriptions of Associations:Intact  Orientation:Full (Time, Place  and Person)  Thought Content:Denies current SI or HI; no obsessions/compulsions; denies AVH, paranoia, ideas of reference, or delusions    Hallucinations:Denied  Ideas of Reference:None  Suicidal Thoughts:Denied  Homicidal Thoughts:Denied  Sensorium  Memory:Fair  Judgment:Fair  Insight:Fair   Executive Functions  Concentration:Good  Attention Span:Good  Recall:Fair  Fund of Knowledge:Good  Language:Good   Psychomotor Activity  Psychomotor Activity:No tremors noted, steady gait, no restlessness or psychomotor slowing  Assets  Assets:Desire for Improvement; Social Support; Housing  Physical Exam Vitals and nursing note reviewed.  Constitutional:      Appearance: Normal appearance.  HENT:     Head: Normocephalic.  Pulmonary:     Effort: Pulmonary effort is normal.  Neurological:     General: No focal deficit present.     Mental Status: He is alert.    Review of Systems  Constitutional:  Positive for diaphoresis and malaise/fatigue.  Respiratory:  Negative for shortness of breath.   Cardiovascular:  Negative for chest pain.  Gastrointestinal:  Positive for nausea. Negative for constipation, diarrhea and vomiting.  Musculoskeletal:  Negative for myalgias.  Neurological:  Positive for dizziness. Negative for headaches.   Blood pressure 115/78, pulse 70, temperature 97.8 F (36.6 C), temperature source Oral, resp. rate 16, height 6' (1.829 m), weight 79.4 kg, SpO2 100 %. Body mass index is 23.73 kg/m.   Treatment Plan Summary: ASSESSMENT: Patient is a 31y/o male with extensive documented h/o MDD and substance induced mood d/o related to chronic alcohol and cocaine use. He does not meet criteria for bipolar d/o on exam. He has reported h/o GAD and there is concern for PTSD but he will not elaborate to confirm diagnosis.    Diagnoses / Active Problems: MDD recurrent severe without psychotic features (r/o substance induced mood d/o) Alcohol use d/o Stimulant use d/o - cocaine type R/o PTSD GAD by hx Nicotine use d/o Seizure history GERD   PLAN: Safety and Monitoring:             --  Voluntary admission to inpatient psychiatric unit for safety, stabilization and treatment             -- Daily contact with patient to assess and evaluate symptoms and progress in treatment             -- Patient's case to be discussed in multi-disciplinary team meeting             -- Observation Level : q15 minute checks             -- Vital signs:  q12 hours             -- Precautions: suicide, elopement, and assault     2. Psychiatric Diagnoses and Treatment:              MDD, recurrent, severe without psychotic features (r/o SIMD) GAD by hx R/o PTSD -- Continue Cymbalta 60mg  daily for depression -- Continue Remeron 7.5mg  qhs to help with sleep, appetite and mood  -- Continue Neurontin 300mg  tid for anxiety and seizures -- Continue Trazodone 100mg  qhs PRN insomnia -- Continue vistaril 50mg  q6 hours PRN anxiety -- Continue Buspar 5mg  bid for anxiety             -- Encouraged patient to participate in unit milieu and in scheduled group therapies                        Alcohol use disorder Stimulant use disorder -  cocaine type -- CIWA with scheduled change to Librium taper with PRN Librium for scores > 10 -- Continue thiamine 100 mg daily. -- Continue multivitamin with minerals daily -- Discussed option of residential rehab and has been accepted to [facility] Tuesday of next week per SW -- Continue Keppra and Neurontin for seizure prevention -- Hepatitis panel pending and HIV testing NR recently              3. Medical Issues Being Addressed:                Admission labs reviewed: UDS positive for cocaine; CBC WNL, CMP WNL, ETOH 346, Salicylate <7, Tylenol <10, respiratory panel negative; Lipase 43 on 9/2; Folate 31.9 9/2; B1 119.5 9/2, B12 255 9/2; CK 263 9/2; HIV NR 9/2; Troponin 2 on 9/2; Mag 2.3 9/2; Repeat EKG pending.                Tobacco Use Disorder             -- Nicotine patch 14mg /24 hours ordered             -- Smoking cessation encouraged   GERD --Continue Protonix  EC 40 mg daily   Seizure d/o by hx -- Placed on seizure precautions -- Continue Keppra 500mg  bid and Neurontin 300mg  tid   Questionable Vitamin B12 deficiency -- restarted home Vit B12 06[birth year]-1984 daily   4. Discharge Planning:              -- Social work and case management to assist with discharge planning and identification of hospital follow-up needs prior to discharge             -- Estimated d/c Tuesday to [facility] for rehab             -- Discharge Concerns: Need to establish a safety plan; Medication compliance and effectiveness             -- Discharge Goals: Return home with outpatient referrals for mental health follow-up including medication management/psychotherapy   I certify that inpatient services furnished can reasonably be expected to improve the patient's condition.    , MD, FAPA 09/21/2022, 8:14 AM

## 2022-09-21 NOTE — Group Note (Signed)
Date:  09/21/2022 Time:  1:35 PM  Group Topic/Focus:  Wellness Toolbox:   The focus of this group is to discuss various aspects of wellness, balancing those aspects and exploring ways to increase the ability to experience wellness.  Patients will create a wellness toolbox for use upon discharge.    Participation Level:  Active  Participation Quality:  Appropriate  Affect:  Appropriate  Cognitive:  Appropriate  Insight: Appropriate  Engagement in Group:  Engaged  Modes of Intervention:  Discussion  Additional Comments:     Jerrye Beavers 09/21/2022, 1:35 PM

## 2022-09-21 NOTE — Progress Notes (Signed)
   09/21/22 0500  Sleep  Number of Hours 8

## 2022-09-21 NOTE — Group Note (Signed)
Recreation Therapy Group Note   Group Topic:Healthy Decision Making  Group Date: 09/21/2022 Start Time: 0930 End Time: 1000 Facilitators: Ledia Hanford-McCall, LRT,CTRS Location: [address] Dayroom   Goal Area(s) Addresses:  Patient will effectively work with peer towards shared goal.  Patient will identify factors that guided their decision making.  Patient will pro-socially communicate ideas during group session.   Group Description:  Patients were given a scenario that they were going to be stranded on a deserted [place] for several months before being rescued. Writer tasked them with making a list of 15 things they would choose to bring with them for "survival". The list of items was prioritized most important to least. Each patient would come up with their own list, then work together to create a new list of 15 items while in a group of 3-5 peers. LRT discussed each person's list and how it differed from others. The debrief included discussion of priorities, good decisions versus bad decisions, and how it is important to think before acting so we can make the best decision possible. LRT tied the concept of effective communication among group members to patient's support systems outside of the hospital and its benefit post discharge.   Affect/Mood: Appropriate   Participation Level: Engaged   Participation Quality: Independent   Behavior: Appropriate   Speech/Thought Process: Focused   Insight: Good   Judgement: Good   Modes of Intervention: Activity   Patient Response to Interventions:  Engaged   Education Outcome:  Acknowledges education and In group clarification offered    Clinical Observations/Individualized Feedback: Pt was bright and engaged.  Pt was also influential with his knowledge of outdoor survival tactics.  Pt was also involved in the completion of the final list for the group.    Plan: Continue to engage patient in RT group sessions  2-3x/week.   Roshan Salamon-McCall, LRT,CTRS 09/21/2022 1:19 PM

## 2022-09-21 NOTE — Group Note (Signed)
Date:  09/21/2022 Time:  6:49 PM  Group Topic/Focus:  Dimensions of Wellness:   The focus of this group is to introduce the topic of wellness and discuss the role each dimension of wellness plays in total health.    Participation Level:  Active  Participation Quality:  Appropriate  Affect:  Appropriate  Cognitive:  Appropriate  Insight: Appropriate  Engagement in Group:  Engaged  Modes of Intervention:  Discussion   Discussed the intellectual dimension of wellness and how it can give a sense of wellbeing.  Additional Comments:    Jerrye Beavers 09/21/2022, 6:49 PM

## 2022-09-21 NOTE — B[facility] Counselor (Signed)
CSW spoke with Sharyn Lull at [facility] [phone] who states that the Pt has been accepted to the facility for an intake assessment on 09/25/2022 at 9:00am.  CSW will inform the providers and the Pt of this information.

## 2022-09-21 NOTE — Progress Notes (Signed)
Pt stated he was feeling better    09/21/22 2030  Psych Admission Type (Psych Patients Only)  Admission Status Voluntary  Psychosocial Assessment  Patient Complaints Anxiety;Substance abuse  Eye Contact Fair  Facial Expression Anxious  Affect Anxious;Preoccupied  Speech Logical/coherent  Interaction Cautious  Motor Activity Slow;Tremors  Appearance/Hygiene Disheveled  Behavior Characteristics Cooperative  Mood Anxious;Preoccupied  Aggressive Behavior  Effect No apparent injury  Thought Process  Coherency Circumstantial  Content Blaming self  Delusions Paranoid  Perception Hallucinations  Hallucination Auditory;Visual  Judgment Impaired  Confusion WDL  Danger to Self  Current suicidal ideation? Passive  Self-Injurious Behavior Some self-injurious ideation observed or expressed.  No lethal plan expressed   Agreement Not to Harm Self Yes  Description of Agreement verbal contract for safety  Danger to Others  Danger to Others None reported or observed

## 2022-09-22 LAB — HCV INTERPRETATION

## 2022-09-22 NOTE — Progress Notes (Signed)
   09/22/22 0500  Sleep  Number of Hours 7    

## 2022-09-22 NOTE — Group Note (Signed)
Date:  09/22/2022 Time:  4:09 PM  Group Topic/Focus:  Anger Management  Additional Comments:  Pt was provided with anger management materials and encouraged to come to staff with any questions.   [name] 09/22/2022, 4:09 PM

## 2022-09-22 NOTE — [facility] Group Notes (Signed)
[facility] Group Notes:  (Nursing/MHT/Case Management/Adjunct)  Date:  09/22/2022  Time:  10:26 PM  Type of Therapy:  Psychoeducational Skills  Participation Level:  Active  Participation Quality:  Appropriate and Sharing  Affect:  Appropriate  Cognitive:  Alert, Appropriate, and Oriented  Insight:  Appropriate and Good  Engagement in Group:  Engaged  Modes of Intervention:  Discussion  Summary of Progress/Problems: Pt. Shared with the group how his day was great due to him getting along with others on the unit.  He also shared how going outside was the icing on the cake.  He is looking to carry out the same good vibes on the following day.    Blenda Mounts [place] 09/22/2022, 10:26 PM

## 2022-09-22 NOTE — Progress Notes (Signed)
   09/22/22 2206  Psych Admission Type (Psych Patients Only)  Admission Status Voluntary  Psychosocial Assessment  Patient Complaints Anxiety  Eye Contact Fair  Facial Expression Anxious  Affect Appropriate to circumstance  Speech Logical/coherent  Interaction Assertive  Motor Activity Other (Comment) (WDL)  Appearance/Hygiene Unremarkable  Behavior Characteristics Appropriate to situation  Mood Preoccupied  Thought Process  Coherency WDL  Content Blaming others  Delusions None reported or observed  Perception Hallucinations  Hallucination None reported or observed  Judgment Impaired  Confusion None  Danger to Self  Current suicidal ideation? Denies  Self-Injurious Behavior No self-injurious ideation or behavior indicators observed or expressed   Agreement Not to Harm Self Yes  Description of Agreement verbal  Danger to Others  Danger to Others None reported or observed

## 2022-09-22 NOTE — [facility] Group Notes (Signed)
[facility] Group Notes:  (Nursing/MHT/Case Management/Adjunct)  Date:  09/22/2022  Time:  10:37 AM  Type of Therapy:   Goals/ Orientation   Participation Level:  Active  Participation Quality:  Appropriate  Affect:  Appropriate  Cognitive:  Appropriate  Insight:  Appropriate  Engagement in Group:  Engaged  Modes of Intervention:  Education  Summary of Progress/Problems:pt. Participated and was engaged.  Bertram Savin 09/22/2022, 10:37 AM

## 2022-09-22 NOTE — Progress Notes (Signed)
   09/22/22 0900  Psych Admission Type (Psych Patients Only)  Admission Status Voluntary  Psychosocial Assessment  Patient Complaints Anxiety;Substance abuse  Eye Contact Fair  Facial Expression Anxious  Affect Anxious;Preoccupied  Speech Logical/coherent  Interaction Cautious  Motor Activity Slow;Tremors  Appearance/Hygiene Improved  Behavior Characteristics Cooperative  Mood Anxious;Preoccupied  Aggressive Behavior  Effect No apparent injury  Thought Process  Coherency Circumstantial  Content Blaming self  Delusions None reported or observed  Perception Hallucinations  Hallucination None reported or observed  Judgment Impaired  Confusion WDL  Danger to Self  Current suicidal ideation? Passive  Self-Injurious Behavior Some self-injurious ideation observed or expressed.  No lethal plan expressed   Agreement Not to Harm Self Yes  Description of Agreement Verbal contract for safety  Danger to Others  Danger to Others None reported or observed

## 2022-09-22 NOTE — Progress Notes (Signed)
[facility] MD Progress Note  09/22/2022 1:00 PM John Vaughan  MRN:  [id number] Subjective:    John Vaughan. [name] is a 31 year old, Caucasian, male with a past psychiatric history significant for major depressive disorder versus bipolar 2 disorder versus substance induced mood disorder, alcohol and cocaine abuse, and generalized anxiety disorder who voluntarily presented to [facility] for suicidal ideation with a plan to cut his wrist and worsening depression.  Upon admission, patient alcohol level was 346 and his UDS was positive for cocaine.  Patient is currently being managed on the following medications:  Cymbalta 60 mg daily for depression Remeron 7.5 mg at bedtime to aid in sleep, appetite, and mood Neurontin 300 mg 3 times daily for the management of anxiety and seizures Trazodone 100 mg at bedtime as needed for the management of insomnia Hydroxyzine 50 mg every 6 hours as needed for the management of anxiety BuSpar 5 mg 2 times daily for the management of anxiety  Patient is also being managed for alcohol use disorder and stimulant use disorder with Librium taper with as needed Librium for scores greater than 10.  Patient to continue with thiamine 100 mg daily as well as multivitamin with minerals daily.  Patient to continue with Keppra and Neurontin for seizure prevention.  During today's assessment, patient denied suicidal or homicidal ideations.  Patient further denies auditory or visual hallucinations.  Patient reports that he is both eating and sleeping well.  Patient does express some withdrawal symptoms characterized by the following symptoms: dizziness, tremors, and blurred vision.  Patient states that he plans on researching the various rehab facilities prior to being discharged from this facility.  Principal Problem: MDD (major depressive disorder), recurrent episode (HCC) Diagnosis: Principal Problem:   MDD (major depressive disorder), recurrent episode  (HCC) Active Problems:   GAD (generalized anxiety disorder)   Alcohol use disorder, severe, dependence (HCC)   Alcohol withdrawal (HCC)   Suicidal ideation   Tobacco abuse   Cocaine abuse (HCC)   Substance-induced disorder (HCC)  Total Time spent with patient: 15 minutes  Past Psychiatric History:  Alcohol use disorder Alcohol withdrawal Cocaine abuse Generalized anxiety disorder Major depressive disorder Tobacco abuse  Past Medical History:  Past Medical History:  Diagnosis Date   Alcohol abuse    Anxiety    Bipolar 2 disorder (HCC)    Depression    Seizures (HCC)     Past Surgical History:  Procedure Laterality Date   HAND SURGERY Right    Family History:  Family History  Problem Relation Age of Onset   Anxiety disorder Mother    Alcohol abuse Father    Anxiety disorder Maternal Aunt    Anxiety disorder Maternal Grandmother    Alcohol abuse Paternal Grandfather    Family Psychiatric  History:  Father - Alcohol abuse Mother - anxiety disorder Grandfather (paternal) - alcohol abuse Grandmother (maternal) - anxiety disorder Aunt (maternal) - anxiety disorder  Social History:  Social History   Substance and Sexual Activity  Alcohol Use Yes   Alcohol/week: 2.0 standard drinks of alcohol   Types: 2 Cans of beer per week   Comment: 48 beers a week     Social History   Substance and Sexual Activity  Drug Use Yes   Types: Marijuana, Cocaine, "Crack" cocaine   Comment: cocaine in last month    Social History   Socioeconomic History   Marital status: Single    Spouse name: Not on file   Number of children:  Not on file   Years of education: Not on file   Highest education level: Not on file  Occupational History   Not on file  Tobacco Use   Smoking status: Every Day    Packs/day: 1.50    Types: Cigarettes   Smokeless tobacco: Former    Types: Snuff   Tobacco comments:    Pt states plans to stop drinking. Will deal with tobacco cessation later.   Vaping Use   Vaping Use: Never used  Substance and Sexual Activity   Alcohol use: Yes    Alcohol/week: 2.0 standard drinks of alcohol    Types: 2 Cans of beer per week    Comment: 48 beers a week   Drug use: Yes    Types: Marijuana, Cocaine, "Crack" cocaine    Comment: cocaine in last month   Sexual activity: Yes  Other Topics Concern   Not on file  Social History Narrative   Not on file   Social Determinants of Health   Financial Resource Strain: Not on file  Food Insecurity: No Food Insecurity (09/19/2022)   Hunger Vital Sign    Worried About Running Out of Food in the Last Year: Never true    Ran Out of Food in the Last Year: Never true  Transportation Needs: No Transportation Needs (09/19/2022)   PRAPARE - Administrator, Civil Service (Medical): No    Lack of Transportation (Non-Medical): No  Physical Activity: Not on file  Stress: Not on file  Social Connections: Not on file   Additional Social History:   Sleep: Good  Appetite:  Good  Current Medications: Current Facility-Administered Medications  Medication Dose Route Frequency Provider Last Rate Last Admin   acetaminophen (TYLENOL) tablet 650 mg  650 mg Oral Q6H PRN Oneta Rack, NP       alum & mag hydroxide-simeth (MAALOX/MYLANTA) 200-200-20 MG/5ML suspension 30 mL  30 mL Oral Q4H PRN Oneta Rack, NP       busPIRone (BUSPAR) tablet 5 mg  5 mg Oral BID Oneta Rack, NP   5 mg at 09/22/22 0757   chlordiazePOXIDE (LIBRIUM) capsule 25 mg  25 mg Oral Q6H PRN Comer Locket, MD       chlordiazePOXIDE (LIBRIUM) capsule 25 mg  25 mg Oral Alice Reichert, Amy E, MD   25 mg at 09/22/22 0757   Followed by   Melene Muller ON 09/23/2022] chlordiazePOXIDE (LIBRIUM) capsule 25 mg  25 mg Oral Daily Mason Jim, Amy E, MD       cyanocobalamin (VITAMIN B12) tablet 1,000 mcg  1,000 mcg Oral Daily Mason Jim, Amy E, MD   1,000 mcg at 09/22/22 0757   dicyclomine (BENTYL) tablet 20 mg  20 mg Oral Q6H PRN Mason Jim,  Amy E, MD       DULoxetine (CYMBALTA) DR capsule 60 mg  60 mg Oral Daily Mason Jim, Amy E, MD   60 mg at 09/22/22 0757   gabapentin (NEURONTIN) capsule 300 mg  300 mg Oral TID Comer Locket, MD   300 mg at 09/22/22 0757   hydrOXYzine (ATARAX) tablet 50 mg  50 mg Oral Q6H PRN Comer Locket, MD   50 mg at 09/22/22 0757   levETIRAcetam (KEPPRA) tablet 500 mg  500 mg Oral BID Comer Locket, MD   500 mg at 09/22/22 0757   loperamide (IMODIUM) capsule 2-4 mg  2-4 mg Oral PRN Oneta Rack, NP       magnesium hydroxide (MILK  OF MAGNESIA) suspension 30 mL  30 mL Oral Daily PRN Derrill Center, NP       methocarbamol (ROBAXIN) tablet 500 mg  500 mg Oral Q8H PRN Nelda Marseille, Amy E, MD       mirtazapine (REMERON) tablet 7.5 mg  7.5 mg Oral QHS Singleton, Amy E, MD   7.5 mg at 09/21/22 2157   multivitamin with minerals tablet 1 tablet  1 tablet Oral Daily Derrill Center, NP   1 tablet at 09/22/22 0757   naproxen (NAPROSYN) tablet 500 mg  500 mg Oral BID PRN Harlow Asa, MD       nicotine (NICODERM CQ - dosed in mg/24 hours) patch 14 mg  14 mg Transdermal Daily Nelda Marseille, Amy E, MD   14 mg at 09/22/22 0758   ondansetron (ZOFRAN-ODT) disintegrating tablet 4 mg  4 mg Oral Q6H PRN Derrill Center, NP       pantoprazole (PROTONIX) EC tablet 40 mg  40 mg Oral Daily Nelda Marseille, Amy E, MD   40 mg at 09/22/22 0757   thiamine (Vitamin B-1) tablet 100 mg  100 mg Oral Daily Derrill Center, NP   100 mg at 09/22/22 0757   traZODone (DESYREL) tablet 100 mg  100 mg Oral QHS PRN Harlow Asa, MD   100 mg at 09/21/22 2116    Lab Results:  Results for orders placed or performed during the hospital encounter of 09/19/22 (from the past 48 hour(s))  Hepatitis panel, acute     Status: Abnormal (Preliminary result)   Collection Time: 09/20/22  6:26 PM  Result Value Ref Range   Hepatitis B Surface Ag PENDING NON REACTIVE   HCV Ab PENDING NON REACTIVE   Hep A IgM Negative (A) Negative   Hep B C IgM PENDING  NON REACTIVE  Interpretation:     Status: None   Collection Time: 09/20/22  6:26 PM  Result Value Ref Range   HCV Interp 1: Comment     Comment: (NOTE) Not infected with HCV unless early or acute infection is suspected (which may be delayed in an immunocompromised individual), or other evidence exists to indicate HCV infection. Performed At: [facility] [place], [place] [id number] Rush Farmer MD KG:[id number]     Blood Alcohol level:  Lab Results  Component Value Date   WYO 378 [facility]) 09/19/2022   ETH 249 (H) 58/85/0277    Metabolic Disorder Labs: Lab Results  Component Value Date   HGBA1C 5.4 05/14/2022   MPG 108.28 05/14/2022   MPG 102.54 10/25/2021   No results found for: "PROLACTIN" Lab Results  Component Value Date   CHOL 196 05/14/2022   TRIG 93 05/14/2022   HDL 57 05/14/2022   CHOLHDL 3.4 05/14/2022   VLDL 19 05/14/2022   LDLCALC 120 (H) 05/14/2022   LDLCALC 93 10/25/2021    Physical Findings: AIMS: Facial and Oral Movements Muscles of Facial Expression: None, normal Lips and Perioral Area: None, normal Jaw: None, normal Tongue: None, normal,Extremity Movements Upper (arms, wrists, hands, fingers): Mild (Rt. hand) Lower (legs, knees, ankles, toes): None, normal, Trunk Movements Neck, shoulders, hips: None, normal, Overall Severity Severity of abnormal movements (highest score from questions above): Mild Incapacitation due to abnormal movements: None, normal Patient's awareness of abnormal movements (rate only patient's report): No Awareness, Dental Status Current problems with teeth and/or dentures?: No Does patient usually wear dentures?: No  CIWA:  CIWA-Ar Total: 0 COWS:     Musculoskeletal: Strength &  Muscle Tone: within normal limits Gait & Station: normal Patient leans: N/A  Psychiatric Specialty Exam:  Presentation  General Appearance:  Appropriate for Environment; Well Groomed  Eye  Contact: Good  Speech: Clear and Coherent; Normal Rate  Speech Volume: Normal  Handedness: Right   Mood and Affect  Mood: Euthymic  Affect: Appropriate; Congruent   Thought Process  Thought Processes: Coherent; Goal Directed  Descriptions of Associations:Intact  Orientation:Full (Time, Place and Person)  Thought Content:Logical; WDL  History of Schizophrenia/Schizoaffective disorder:No  Duration of Psychotic Symptoms:N/A  Hallucinations:Hallucinations: None  Ideas of Reference:None  Suicidal Thoughts:Suicidal Thoughts: No  Homicidal Thoughts:Homicidal Thoughts: No   Sensorium  Memory: Immediate Good; Recent Good; Remote Good  Judgment: Good  Insight: Fair   Art therapistxecutive Functions  Concentration: Good  Attention Span: Good  Recall: Good  Fund of Knowledge: Good  Language: Good   Psychomotor Activity  Psychomotor Activity: Psychomotor Activity: Normal   Assets  Assets: Desire for Improvement; Social Support; Housing   Sleep  Sleep: Sleep: Good    Physical Exam: Physical Exam Psychiatric:        Attention and Perception: Attention and perception normal.        Mood and Affect: Mood normal.        Speech: Speech normal.        Behavior: Behavior normal. Behavior is cooperative.        Thought Content: Thought content normal. Thought content does not include homicidal or suicidal ideation. Thought content does not include suicidal plan.        Cognition and Memory: Cognition and memory normal.        Judgment: Judgment normal.    Review of Systems  Psychiatric/Behavioral:  Positive for substance abuse. Negative for depression, hallucinations and suicidal ideas. The patient is not nervous/anxious and does not have insomnia.    Blood pressure 107/85, pulse 75, temperature 97.6 F (36.4 C), temperature source Oral, resp. rate 16, height 6' (1.829 m), weight 79.4 kg, SpO2 100 %. Body mass index is 23.73 kg/m.   Treatment  Plan Summary: Daily contact with patient to assess and evaluate symptoms and progress in treatment  Plan  Safety and Monitoring: - Voluntary admission to inpatient psychiatric unit for safety, stabilization and treatment - Daily contact with patient to assess and evaluate symptoms and progress in treatment - Patient's case to be discussed in multi-disciplinary team meeting - Observation Level : q15 minute checks - Vital signs:  q12 hours - Precautions: suicide, elopement, and assault  Major depressive disorder, recurrent, severe without psychotic features Patient to continue taking Cymbalta 60 mg daily Patient to continue taking Remeron 7.5 mg at bedtime  Generalized anxiety disorder - Patient to continue taking Cymbalta 60 mg daily for the management of his anxiety - Patient to continue taking her Remeron 7.5 mg daily for the management of his anxiety - Patient to continue taking Neurontin 300 mg 3 times daily for the management of his anxiety - Patient to continue taking hydroxyzine 50 mg every 6 hours as needed for the management of his anxiety - Patient to continue taking BuSpar 5 mg 2 times daily for the management of his anxiety  Alcohol use disorder/stimulant use disorder (cocaine use) - CIWA with scheduled changed to Librium taper with Librium as needed for scores greater than 10 - Patient to continue taking thiamine 100 mg daily - Patient to continue taking multivitamin with minerals daily - Patient plans on being admitted to a residential rehab facility following  discharge from this facility - Patient to continue taking Neurontin and Keppra for seizure prevention  Tobacco use disorder - Nicotine patch 14 mg / 24 hours - Smoking cessation encouraged  GERD - Patient to continue taking Protonix EC 40 mg daily  Seizure disorder by history - Patient placed on seizure precautions - Patient to continue taking Keppra 500 mg 2 times daily and Neurontin 300 mg 3 - times  daily for the prevention of seizures  Vitamin B12 deficiency - Patient to continue taking vitamin B12 1000 mcg daily  Discharge Planning:  - Social work and case management to assist with discharge planning and identification of hospital follow-up needs prior to discharge - Estimated d/c Tuesday, patient is currently researching different residential treatment programs facilities within the area - Discharge Concerns: Need to establish a safety plan; Medication compliance and effectiveness - Discharge Goals: Return home with outpatient referrals for mental health follow-up including medication management/psychotherapy    Meta Hatchet, PA 09/22/2022, 1:00 PM

## 2022-09-22 NOTE — Group Note (Signed)
LCSW Group Therapy Note  09/22/2022    10:00-11:00am   Type of Therapy and Topic:  Group Therapy: Early Messages Received About Anger  Participation Level:  Active   Description of Group:   In this group, patients shared and discussed the early messages received in their lives about anger through parental or other adult modeling, teaching, repression, punishment, violence, and more.  Participants identified how those childhood lessons influence even now how they usually or often react when angered.  The group discussed that anger is a secondary emotion and what may be the underlying emotional themes that come out through anger outbursts or that are ignored through anger suppression.    Therapeutic Goals: Patients will identify one or more childhood message about anger that they received and how it was taught to them. Patients will discuss how these childhood experiences have influenced and continue to influence their own expression or repression of anger even today. Patients will explore possible primary emotions that tend to fuel their secondary emotion of anger. Patients will learn that anger itself is normal and cannot be eliminated, and that healthier coping skills can assist with resolving conflict rather than worsening situations.  Summary of Patient Progress:  The patient shared that their childhood lessons about anger were "Fighting was encouraged, if my family found out I lost a fight they would hit me".  As a result, "I don't take no shit. I have no problem fighting someone".  The patient participated fully (at times innapropriately promoting physical fights) and demonstrated slight insight.  Therapeutic Modalities:   Cognitive Behavioral Therapy Motivation Interviewing  [place], [place] 09/22/2022 11:38 AM

## 2022-09-23 MED ORDER — GABAPENTIN 400 MG PO CAPS
400.0000 mg | ORAL_CAPSULE | Freq: Three times a day (TID) | ORAL | Status: DC
  Administered 2022-09-23 – 2022-09-25 (×6): 400 mg via ORAL
  Filled 2022-09-23 (×2): qty 1
  Filled 2022-09-23 (×2): qty 42
  Filled 2022-09-23 (×2): qty 1
  Filled 2022-09-23: qty 42
  Filled 2022-09-23 (×4): qty 1

## 2022-09-23 NOTE — Progress Notes (Signed)
[facility] Group Notes:  (Nursing/MHT/Case Management/Adjunct)  Date:  09/23/2022  Time:  2000  Type of Therapy:   wrap up group  Participation Level:  Active  Participation Quality:  Appropriate, Attentive, Sharing, and Supportive  Affect:  Appropriate  Cognitive:  Alert  Insight:  Improving  Engagement in Group:  Engaged  Modes of Intervention:  Clarification, Education, and Support  Summary of Progress/Problems: Positive thinking and positive change were discussed.   Shellia Cleverly 09/23/2022, 9:11 PM

## 2022-09-23 NOTE — Progress Notes (Signed)
   09/23/22 2219  Psych Admission Type (Psych Patients Only)  Admission Status Voluntary  Psychosocial Assessment  Patient Complaints Anxiety  Eye Contact Fair  Facial Expression Animated  Affect Appropriate to circumstance;Apprehensive  Speech Logical/coherent  Interaction Assertive  Appearance/Hygiene Unremarkable  Behavior Characteristics Appropriate to situation  Mood Anxious;Pleasant  Thought Process  Coherency WDL  Content WDL  Delusions None reported or observed  Perception WDL  Hallucination None reported or observed  Judgment Limited  Confusion None  Danger to Self  Current suicidal ideation? Denies  Self-Injurious Behavior No self-injurious ideation or behavior indicators observed or expressed   Agreement Not to Harm Self Yes  Description of Agreement verbal  Danger to Others  Danger to Others None reported or observed

## 2022-09-23 NOTE — Plan of Care (Signed)
  Problem: Education: Goal: Emotional status will improve Outcome: Progressing Goal: Mental status will improve Outcome: Progressing   

## 2022-09-23 NOTE — Group Note (Signed)
[facility] LCSW Group Therapy Note   Group Date: 09/23/2022 Start Time: 1300 End Time: 1400   Type of Therapy and Topic: Group Therapy: Avoiding Self-Sabotaging and Enabling Behaviors  Participation Level: Active  Description of Group:  In this group, patients will learn how to identify obstacles, self-sabotaging and enabling behaviors, as well as: what are they, why do we do them and what needs these behaviors meet. Discuss unhealthy relationships and how to have positive healthy boundaries with those that sabotage and enable. Explore aspects of self-sabotage and enabling in yourself and how to limit these self-destructive behaviors in everyday life.   Therapeutic Goals: 1. Patient will identify one obstacle that relates to self-sabotage and enabling behaviors 2. Patient will identify one personal self-sabotaging or enabling behavior they did prior to admission 3. Patient will state a plan to change the above identified behavior 4. Patient will demonstrate ability to communicate their needs through discussion and/or role play.    Summary of Patient Progress: Patient was present for the entirety of the group session. Patient was an active listener and participated in the topic of discussion, provided helpful advice to others, and added nuance to topic of conversation. Patient participated in role playing exercise discussing potential adaptive and maladaptive thought/emotional reactions to difficult situations.    Therapeutic Modalities:  Cognitive Behavioral Therapy Person-Centered Therapy Motivational Interviewing    Durenda Hurt, [place]

## 2022-09-23 NOTE — [facility] Group Notes (Signed)
Patient attended group therapy activity called "Now Future Wall". Activity included things that are currently going on in the patient's life, what they want their futures to look like, and what walls or barriers are getting in the way. On the back side, patient listed coping skills to help them when they are faced with the "walls". Patient identified "pouring out a beer bottle" as one of their future goals.

## 2022-09-23 NOTE — Progress Notes (Signed)
   09/23/22 0900  Charting Type  Charting Type Shift assessment  Neurological  Neuro (WDL) X (WNL)  Neuro Symptoms Anxiety  HEENT  HEENT (WDL) WDL  Respiratory  Respiratory (WDL) WDL  Cardiac  Cardiac (WDL) WDL  Vascular  Vascular (WDL) WDL  Integumentary  Integumentary (WDL) WDL  Braden Scale (Ages 8 and up)  Sensory Perceptions 4  Moisture 4  Activity 4  Mobility 4  Nutrition 3  Friction and Shear 3  Braden Scale Score 22  Musculoskeletal  Musculoskeletal (WDL) WDL  Gastrointestinal  Gastrointestinal (WDL) WDL  GU Assessment  Genitourinary (WDL) WDL  Neurological  Level of Consciousness Alert

## 2022-09-23 NOTE — Progress Notes (Signed)
[facility] MD Progress Note  09/23/2022 11:33 AM John Vaughan  MRN:  [id number]  HPI:  John Vaughan is a 31 year old, Caucasian, male with a past psychiatric history significant for major depressive disorder versus bipolar 2 disorder versus substance induced mood disorder, alcohol and cocaine abuse, and generalized anxiety disorder who voluntarily presented to [facility] for suicidal ideation with a plan to cut his wrist in the context of worsening depressive symptoms and substance use.  Upon admission, patient alcohol level was 346 and his UDS was positive for cocaine.  Patient was admitted voluntarily to this [facility] for treatment and stabilization of his mood.  24 hr chart review: V/S for the past 24 hrs have been WNL. Pt has attended all group sessions over the past 24 hrs, and has actively participated. He is taking all medications as ordered, and only PRN medication taken last night was Trazodone 50 mg.  Today's patient assessment note: Today, pt presents with a euthymic mood & affect is congruent. He is oriented to person, place, time and situation. His attention to personal hygiene and grooming is fair, eye contact is good, speech is clear & coherent. Thought contents are organized and logical, and pt currently denies SI/HI/AVH or paranoia. There is no evidence of delusional thoughts.  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  Pt reports a good sleep quality last night, reports a good appetite, verbalizes readiness for [facility], but reports being sceptical "because I have heard a lot of bad things about [facility]." Pt encouraged not to rely on words from other patients, but to give the program a try, and is agreeable. Pt reports that medications have been very helpful, but reports that he is craving alcohol. We are increasing Gabapentin to 400 mg TID  for alcohol cravings, and continuing other medications as listed below.  Principal Problem: MDD (major depressive disorder), recurrent episode (HCC) Diagnosis: Principal Problem:   MDD (major depressive disorder), recurrent episode (HCC) Active Problems:   GAD (generalized anxiety disorder)   Alcohol use disorder, severe, dependence (HCC)   Alcohol withdrawal (HCC)   Suicidal ideation   Tobacco abuse   Cocaine abuse (HCC)   Substance-induced disorder (HCC)  Total Time spent with patient: 15 minutes  Past Psychiatric History:  Alcohol use disorder Alcohol withdrawal Cocaine abuse Generalized anxiety disorder Major depressive disorder Tobacco abuse  Past Medical History:  Past Medical History:  Diagnosis Date   Alcohol abuse    Anxiety    Bipolar 2 disorder (HCC)    Depression    Seizures (HCC)     Past Surgical History:  Procedure Laterality Date   HAND SURGERY Right    Family History:  Family History  Problem Relation Age of Onset   Anxiety disorder Mother    Alcohol abuse Father    Anxiety disorder Maternal Aunt    Anxiety disorder Maternal Grandmother    Alcohol abuse Paternal Grandfather    Family Psychiatric  History:  Father - Alcohol abuse Mother - anxiety disorder Grandfather (paternal) - alcohol abuse Grandmother (maternal) - anxiety disorder Aunt (maternal) - anxiety disorder  Social History:  Social History   Substance and Sexual Activity  Alcohol Use Yes   Alcohol/week: 2.0 standard drinks of alcohol   Types: 2 Cans of beer per week   Comment: 48 beers a week  Social History   Substance and Sexual Activity  Drug Use Yes   Types: Marijuana, Cocaine, "Crack" cocaine   Comment: cocaine in last month    Social History   Socioeconomic History   Marital status: Single    Spouse name: Not on file   Number of children: Not on file   Years of education: Not on file   Highest education level: Not on file  Occupational History   Not on  file  Tobacco Use   Smoking status: Every Day    Packs/day: 1.50    Types: Cigarettes   Smokeless tobacco: Former    Types: Snuff   Tobacco comments:    Pt states plans to stop drinking. Will deal with tobacco cessation later.  Vaping Use   Vaping Use: Never used  Substance and Sexual Activity   Alcohol use: Yes    Alcohol/week: 2.0 standard drinks of alcohol    Types: 2 Cans of beer per week    Comment: 48 beers a week   Drug use: Yes    Types: Marijuana, Cocaine, "Crack" cocaine    Comment: cocaine in last month   Sexual activity: Yes  Other Topics Concern   Not on file  Social History Narrative   Not on file   Social Determinants of Health   Financial Resource Strain: Not on file  Food Insecurity: No Food Insecurity (09/19/2022)   Hunger Vital Sign    Worried About Running Out of Food in the Last Year: Never true    Ran Out of Food in the Last Year: Never true  Transportation Needs: No Transportation Needs (09/19/2022)   PRAPARE - Administrator, Civil ServiceTransportation    Lack of Transportation (Medical): No    Lack of Transportation (Non-Medical): No  Physical Activity: Not on file  Stress: Not on file  Social Connections: Not on file   Additional Social History:   Sleep: Good  Appetite:  Good  Current Medications: Current Facility-Administered Medications  Medication Dose Route Frequency Provider Last Rate Last Admin   acetaminophen (TYLENOL) tablet 650 mg  650 mg Oral Q6H PRN Oneta RackLewis, Tanika N, NP       alum & mag hydroxide-simeth (MAALOX/MYLANTA) 200-200-20 MG/5ML suspension 30 mL  30 mL Oral Q4H PRN Oneta RackLewis, Tanika N, NP       busPIRone (BUSPAR) tablet 5 mg  5 mg Oral BID Oneta RackLewis, Tanika N, NP   5 mg at 09/23/22 0753   cyanocobalamin (VITAMIN B12) tablet 1,000 mcg  1,000 mcg Oral Daily Mason JimSingleton, Amy E, MD   1,000 mcg at 09/23/22 0753   dicyclomine (BENTYL) tablet 20 mg  20 mg Oral Q6H PRN Comer LocketSingleton, Amy E, MD       DULoxetine (CYMBALTA) DR capsule 60 mg  60 mg Oral Daily Mason JimSingleton, Amy  E, MD   60 mg at 09/23/22 0756   gabapentin (NEURONTIN) capsule 400 mg  400 mg Oral TID Starleen BlueNkwenti, Chen Holzman, NP       levETIRAcetam (KEPPRA) tablet 500 mg  500 mg Oral BID Mason JimSingleton, Amy E, MD   500 mg at 09/23/22 0758   magnesium hydroxide (MILK OF MAGNESIA) suspension 30 mL  30 mL Oral Daily PRN Oneta RackLewis, Tanika N, NP       methocarbamol (ROBAXIN) tablet 500 mg  500 mg Oral Q8H PRN Mason JimSingleton, Amy E, MD       mirtazapine (REMERON) tablet 7.5 mg  7.5 mg Oral QHS Mason JimSingleton, Amy E, MD   7.5 mg at 09/22/22 2103   multivitamin  with minerals tablet 1 tablet  1 tablet Oral Daily Oneta Rack, NP   1 tablet at 09/23/22 0757   naproxen (NAPROSYN) tablet 500 mg  500 mg Oral BID PRN Comer Locket, MD       nicotine (NICODERM CQ - dosed in mg/24 hours) patch 14 mg  14 mg Transdermal Daily Mason Jim, Amy E, MD   14 mg at 09/23/22 0756   pantoprazole (PROTONIX) EC tablet 40 mg  40 mg Oral Daily Bartholomew Crews E, MD   40 mg at 09/23/22 0755   thiamine (Vitamin B-1) tablet 100 mg  100 mg Oral Daily Oneta Rack, NP   100 mg at 09/23/22 0757   traZODone (DESYREL) tablet 100 mg  100 mg Oral QHS PRN Comer Locket, MD   100 mg at 09/22/22 2103    Lab Results:  No results found for this or any previous visit (from the past 48 hour(s)).   Blood Alcohol level:  Lab Results  Component Value Date   ETH 346 (HH) 09/19/2022   ETH 249 (H) 08/25/2022    Metabolic Disorder Labs: Lab Results  Component Value Date   HGBA1C 5.4 05/14/2022   MPG 108.28 05/14/2022   MPG 102.54 10/25/2021   No results found for: "PROLACTIN" Lab Results  Component Value Date   CHOL 196 05/14/2022   TRIG 93 05/14/2022   HDL 57 05/14/2022   CHOLHDL 3.4 05/14/2022   VLDL 19 05/14/2022   LDLCALC 120 (H) 05/14/2022   LDLCALC 93 10/25/2021   Physical Findings: AIMS: Facial and Oral Movements Muscles of Facial Expression: None, normal Lips and Perioral Area: None, normal Jaw: None, normal Tongue: None, normal,Extremity  Movements Upper (arms, wrists, hands, fingers): Mild (Rt. hand) Lower (legs, knees, ankles, toes): None, normal, Trunk Movements Neck, shoulders, hips: None, normal, Overall Severity Severity of abnormal movements (highest score from questions above): Mild Incapacitation due to abnormal movements: None, normal Patient's awareness of abnormal movements (rate only patient's report): No Awareness, Dental Status Current problems with teeth and/or dentures?: No Does patient usually wear dentures?: No  CIWA:  CIWA-Ar Total: 0 COWS:   n/a  Musculoskeletal: Strength & Muscle Tone: within normal limits Gait & Station: normal Patient leans: N/A  Psychiatric Specialty Exam:  Presentation  General Appearance:  Appropriate for Environment; Fairly Groomed  Eye Contact: Good  Speech: Clear and Coherent  Speech Volume: Normal  Handedness: Right   Mood and Affect  Mood: Euthymic  Affect: Congruent  Thought Process  Thought Processes: Coherent  Descriptions of Associations:Intact  Orientation:Full (Time, Place and Person)  Thought Content:Logical  History of Schizophrenia/Schizoaffective disorder:No  Duration of Psychotic Symptoms:N/A  Hallucinations:Hallucinations: None  Ideas of Reference:None  Suicidal Thoughts:Suicidal Thoughts: No  Homicidal Thoughts:Homicidal Thoughts: No  Sensorium  Memory: Immediate Good  Judgment: Good  Insight: Good  Executive Functions  Concentration: Good  Attention Span: Good  Recall: Good  Fund of Knowledge: Good  Language: Good  Psychomotor Activity  Psychomotor Activity: Psychomotor Activity: Normal  Assets  Assets: Communication Skills  Sleep  Sleep: Sleep: Good  Physical Exam: Physical Exam Constitutional:      Appearance: Normal appearance.  HENT:     Head: Normocephalic.     Nose: No congestion or rhinorrhea.  Eyes:     Pupils: Pupils are equal, round, and reactive to light.  Pulmonary:      Effort: Pulmonary effort is normal.  Musculoskeletal:        General: Normal range of motion.  Cervical back: Normal range of motion.  Neurological:     Mental Status: He is alert.  Psychiatric:        Attention and Perception: Attention and perception normal.        Mood and Affect: Mood normal.        Speech: Speech normal.        Behavior: Behavior normal. Behavior is cooperative.        Thought Content: Thought content normal. Thought content does not include homicidal or suicidal ideation. Thought content does not include suicidal plan.        Cognition and Memory: Cognition and memory normal.        Judgment: Judgment normal.    Review of Systems  Constitutional: Negative.   HENT: Negative.    Eyes: Negative.   Respiratory: Negative.    Cardiovascular: Negative.   Gastrointestinal: Negative.   Musculoskeletal:  Positive for back pain.  Skin: Negative.   Neurological: Negative.   Psychiatric/Behavioral:  Positive for substance abuse. Negative for depression, hallucinations, memory loss and suicidal ideas. The patient is not nervous/anxious and does not have insomnia.    Blood pressure 102/71, pulse 68, temperature 97.6 F (36.4 C), temperature source Oral, resp. rate 16, height 6' (1.829 m), weight 79.4 kg, SpO2 100 %. Body mass index is 23.73 kg/m.   Treatment Plan Summary: Daily contact with patient to assess and evaluate symptoms and progress in treatment  Plan  Safety and Monitoring: - Voluntary admission to inpatient psychiatric unit for safety, stabilization and treatment - Daily contact with patient to assess and evaluate symptoms and progress in treatment - Patient's case to be discussed in multi-disciplinary team meeting - Observation Level : q15 minute checks - Vital signs:  q12 hours - Precautions: suicide, elopement, and assault  Major depressive disorder, recurrent, severe without psychotic features Patient to continue taking Cymbalta 60 mg  daily Patient to continue taking Remeron 7.5 mg at bedtime  Generalized anxiety disorder - Patient to continue taking Cymbalta 60 mg daily for the management of his anxiety - Patient to continue taking her Remeron 7.5 mg daily for the management of his anxiety - Patient to continue taking hydroxyzine 50 mg every 6 hours as needed for the management of his anxiety - Patient to continue taking BuSpar 5 mg 2 times daily for the management of his anxiety  Alcohol use disorder/stimulant use disorder (cocaine use) -[facility] detox protocol completed - Patient to continue taking thiamine 100 mg daily - Patient to continue taking multivitamin with minerals daily - Patient plans on being admitted to a residential rehab facility following-[facility] on Tuesday 09/25/2022  discharge from this facility - Patient to continue Keppra 500 mg BID for seizure prevention -Increase Gabapentin to 400 mg TID for alcohol use cravings  Tobacco use disorder - Nicotine patch 14 mg / 24 hours - Smoking cessation encouraged  GERD - Patient to continue taking Protonix EC 40 mg daily  Seizure disorder by history - Patient placed on seizure precautions - Patient to continue taking Keppra 500 mg 2 times daily   Vitamin B12 deficiency - Patient to continue taking vitamin B12 1000 mcg daily  Discharge Planning:  - Social work and case management to assist with discharge planning and identification of hospital follow-up needs prior to discharge - Estimated d/c Tuesday, patient is currently researching different residential treatment programs facilities within the area - Discharge Concerns: Need to establish a safety plan; Medication compliance and effectiveness - Discharge Goals: Return home with  outpatient referrals for mental health follow-up including medication management/psychotherapy    Starleen Blue, NP 09/23/2022, 11:33 AMPatient ID: John Vaughan, male   DOB: 23-Nov-1991, 31 y.o.   MRN: [id number]

## 2022-09-24 MED ORDER — TRAZODONE HCL 100 MG PO TABS: 100.0000 mg | ORAL_TABLET | Freq: Every evening | ORAL | 0 refills | Status: DC | PRN

## 2022-09-24 MED ORDER — LEVETIRACETAM 500 MG PO TABS: 500.0000 mg | ORAL_TABLET | Freq: Two times a day (BID) | ORAL | 0 refills | Status: DC

## 2022-09-24 MED ORDER — DULOXETINE HCL 60 MG PO CPEP: 60.0000 mg | ORAL_CAPSULE | Freq: Every day | ORAL | 0 refills | Status: DC

## 2022-09-24 MED ORDER — MIRTAZAPINE 7.5 MG PO TABS: 7.5000 mg | ORAL_TABLET | Freq: Every day | ORAL | 0 refills | Status: DC

## 2022-09-24 MED ORDER — BUSPIRONE HCL 5 MG PO TABS: 5.0000 mg | ORAL_TABLET | Freq: Two times a day (BID) | ORAL | 0 refills | Status: DC

## 2022-09-24 MED ORDER — NICOTINE 14 MG/24HR TD PT24: 14.0000 mg | MEDICATED_PATCH | Freq: Every day | TRANSDERMAL | 0 refills | Status: DC

## 2022-09-24 MED ORDER — GABAPENTIN 400 MG PO CAPS: 400.0000 mg | ORAL_CAPSULE | Freq: Three times a day (TID) | ORAL | 0 refills | Status: DC

## 2022-09-24 MED ORDER — PANTOPRAZOLE SODIUM 40 MG PO TBEC: 40.0000 mg | DELAYED_RELEASE_TABLET | Freq: Every day | ORAL | 0 refills | Status: DC

## 2022-09-24 MED ORDER — CYANOCOBALAMIN 1000 MCG PO TABS: 1000.0000 ug | ORAL_TABLET | Freq: Every day | ORAL | 0 refills | Status: DC

## 2022-09-24 NOTE — Progress Notes (Signed)
[facility] MD Progress Note  09/24/2022 11:58 AM John Vaughan  MRN:  [id number]  HPI:  John Vaughan is a 31 year old, Caucasian, male with a past psychiatric history significant for major depressive disorder versus bipolar 2 disorder versus substance induced mood disorder, alcohol and cocaine abuse, and generalized anxiety disorder who voluntarily presented to [facility] for suicidal ideation with a plan to cut his wrist in the context of worsening depressive symptoms and substance use.  Upon admission, patient alcohol level was 346 and his UDS was positive for cocaine.  Patient was admitted voluntarily to this [facility] for treatment and stabilization of his mood.  24 hr chart review: V/S for the past 24 hrs continue to be mostly WNL. Pt has continued to attend unit group sessions over the past 24 hrs, and is actively participating. He has been compliant with all scheduled medications. Only PRN medication taken last night was Trazodone 100 mg. Pt has not required any agitation protocol medications in the past 24 hrs.  Today's patient assessment note: Pt continues to present today with a euthymic mood & affect is congruent. He is oriented to person, place, time and situation. His attention to personal hygiene and grooming is fair, eye contact is good, speech is clear & coherent. Thoughts are organized and logical, and pt currently denies SI, denies HI, and denies AVH or paranoia. There is no evidence of delusional thoughts.  He continues to report pain to his back, and states that the Naproxen and Robaxin are helpful.   Pt continues to report a good sleep quality, and states that he slept well last night. He reports a good appetite, and states that he spoke with the CSW and feels good about the plan to go to [facility]. He reports that he wishes that he could go somewhere where smoking is allowed, but will plan to go to [facility] tomorrow morning for substance abuse rehab.  Pt denies having  any side effects on current medications. He denies any other concerns. We are continuing medications as listed below with no changes made today.  Principal Problem: MDD (major depressive disorder), recurrent episode (HCC) Diagnosis: Principal Problem:   MDD (major depressive disorder), recurrent episode (HCC) Active Problems:   GAD (generalized anxiety disorder)   Alcohol use disorder, severe, dependence (HCC)   Alcohol withdrawal (HCC)   Suicidal ideation   Tobacco abuse   Cocaine abuse (HCC)   Substance-induced disorder (HCC)  Total Time spent with patient: 15 minutes  Past Psychiatric History:  Alcohol use disorder Alcohol withdrawal Cocaine abuse Generalized anxiety disorder Major depressive disorder Tobacco abuse  Past Medical History:  Past Medical History:  Diagnosis Date   Alcohol abuse    Anxiety    Bipolar 2 disorder (HCC)    Depression    Seizures (HCC)     Past Surgical History:  Procedure Laterality Date   HAND SURGERY Right    Family History:  Family History  Problem Relation Age of Onset   Anxiety disorder Mother    Alcohol abuse Father    Anxiety disorder Maternal Aunt    Anxiety disorder Maternal Grandmother    Alcohol abuse Paternal Grandfather    Family Psychiatric  History:  Father - Alcohol abuse Mother - anxiety disorder Grandfather (paternal) - alcohol abuse Grandmother (maternal) - anxiety disorder Aunt (maternal) - anxiety disorder  Social History:  Social History   Substance and Sexual Activity  Alcohol Use Yes   Alcohol/week: 2.0 standard drinks of alcohol  Types: 2 Cans of beer per week   Comment: 48 beers a week     Social History   Substance and Sexual Activity  Drug Use Yes   Types: Marijuana, Cocaine, "Crack" cocaine   Comment: cocaine in last month    Social History   Socioeconomic History   Marital status: Single    Spouse name: Not on file   Number of children: Not on file   Years of education: Not on file    Highest education level: Not on file  Occupational History   Not on file  Tobacco Use   Smoking status: Every Day    Packs/day: 1.50    Types: Cigarettes   Smokeless tobacco: Former    Types: Snuff   Tobacco comments:    Pt states plans to stop drinking. Will deal with tobacco cessation later.  Vaping Use   Vaping Use: Never used  Substance and Sexual Activity   Alcohol use: Yes    Alcohol/week: 2.0 standard drinks of alcohol    Types: 2 Cans of beer per week    Comment: 48 beers a week   Drug use: Yes    Types: Marijuana, Cocaine, "Crack" cocaine    Comment: cocaine in last month   Sexual activity: Yes  Other Topics Concern   Not on file  Social History Narrative   Not on file   Social Determinants of Health   Financial Resource Strain: Not on file  Food Insecurity: No Food Insecurity (09/19/2022)   Hunger Vital Sign    Worried About Running Out of Food in the Last Year: Never true    Ran Out of Food in the Last Year: Never true  Transportation Needs: No Transportation Needs (09/19/2022)   PRAPARE - Hydrologist (Medical): No    Lack of Transportation (Non-Medical): No  Physical Activity: Not on file  Stress: Not on file  Social Connections: Not on file   Additional Social History:   Sleep: Good  Appetite:  Good  Current Medications: Current Facility-Administered Medications  Medication Dose Route Frequency Provider Last Rate Last Admin   acetaminophen (TYLENOL) tablet 650 mg  650 mg Oral Q6H PRN Derrill Center, NP       alum & mag hydroxide-simeth (MAALOX/MYLANTA) 200-200-20 MG/5ML suspension 30 mL  30 mL Oral Q4H PRN Derrill Center, NP       busPIRone (BUSPAR) tablet 5 mg  5 mg Oral BID Derrill Center, NP   5 mg at 09/24/22 [id number]   cyanocobalamin (VITAMIN B12) tablet 1,000 mcg  1,000 mcg Oral Daily Viann Fish E, MD   1,000 mcg at 09/24/22 0737   dicyclomine (BENTYL) tablet 20 mg  20 mg Oral Q6H PRN Harlow Asa, MD        DULoxetine (CYMBALTA) DR capsule 60 mg  60 mg Oral Daily Nelda Marseille, Amy E, MD   60 mg at 09/24/22 0737   gabapentin (NEURONTIN) capsule 400 mg  400 mg Oral TID Nicholes Rough, NP   400 mg at 09/24/22 0737   levETIRAcetam (KEPPRA) tablet 500 mg  500 mg Oral BID Viann Fish E, MD   500 mg at 09/24/22 0737   magnesium hydroxide (MILK OF MAGNESIA) suspension 30 mL  30 mL Oral Daily PRN Derrill Center, NP       methocarbamol (ROBAXIN) tablet 500 mg  500 mg Oral Q8H PRN Harlow Asa, MD       mirtazapine (REMERON) tablet  7.5 mg  7.5 mg Oral QHS Nelda Marseille, Amy E, MD   7.5 mg at 09/23/22 2111   multivitamin with minerals tablet 1 tablet  1 tablet Oral Daily Derrill Center, NP   1 tablet at 09/24/22 0737   naproxen (NAPROSYN) tablet 500 mg  500 mg Oral BID PRN Harlow Asa, MD       nicotine (NICODERM CQ - dosed in mg/24 hours) patch 14 mg  14 mg Transdermal Daily Nelda Marseille, Amy E, MD   14 mg at 09/24/22 0737   pantoprazole (PROTONIX) EC tablet 40 mg  40 mg Oral Daily Harlow Asa, MD   40 mg at 09/24/22 0737   thiamine (Vitamin B-1) tablet 100 mg  100 mg Oral Daily Derrill Center, NP   100 mg at 09/24/22 0737   traZODone (DESYREL) tablet 100 mg  100 mg Oral QHS PRN Harlow Asa, MD   100 mg at 09/23/22 2111    Lab Results:  No results found for this or any previous visit (from the past 48 hour(s)).   Blood Alcohol level:  Lab Results  Component Value Date   ETH 346 (HH) 09/19/2022   ETH 249 (H) 46/27/0350    Metabolic Disorder Labs: Lab Results  Component Value Date   HGBA1C 5.4 05/14/2022   MPG 108.28 05/14/2022   MPG 102.54 10/25/2021   No results found for: "PROLACTIN" Lab Results  Component Value Date   CHOL 196 05/14/2022   TRIG 93 05/14/2022   HDL 57 05/14/2022   CHOLHDL 3.4 05/14/2022   VLDL 19 05/14/2022   LDLCALC 120 (H) 05/14/2022   LDLCALC 93 10/25/2021   Physical Findings: AIMS: Facial and Oral Movements Muscles of Facial Expression: None,  normal Lips and Perioral Area: None, normal Jaw: None, normal Tongue: None, normal,Extremity Movements Upper (arms, wrists, hands, fingers): Mild (Rt. hand) Lower (legs, knees, ankles, toes): None, normal, Trunk Movements Neck, shoulders, hips: None, normal, Overall Severity Severity of abnormal movements (highest score from questions above): Mild Incapacitation due to abnormal movements: None, normal Patient's awareness of abnormal movements (rate only patient's report): No Awareness, Dental Status Current problems with teeth and/or dentures?: No Does patient usually wear dentures?: No  CIWA:  CIWA-Ar Total: 0 COWS:   n/a  Musculoskeletal: Strength & Muscle Tone: within normal limits Gait & Station: normal Patient leans: N/A  Psychiatric Specialty Exam:  Presentation  General Appearance:  Appropriate for Environment; Fairly Groomed  Eye Contact: Good  Speech: Clear and Coherent  Speech Volume: Normal  Handedness: Right   Mood and Affect  Mood: Euthymic  Affect: Appropriate; Congruent  Thought Process  Thought Processes: Coherent  Descriptions of Associations:Intact  Orientation:Full (Time, Place and Person)  Thought Content:Logical  History of Schizophrenia/Schizoaffective disorder:No  Duration of Psychotic Symptoms:N/A  Hallucinations:Hallucinations: None  Ideas of Reference:None  Suicidal Thoughts:Suicidal Thoughts: No  Homicidal Thoughts:Homicidal Thoughts: No  Sensorium  Memory: Immediate Good  Judgment: Good  Insight: Good  Executive Functions  Concentration: Good  Attention Span: Good  Recall: Good  Fund of Knowledge: Good  Language: Good  Psychomotor Activity  Psychomotor Activity: Psychomotor Activity: Normal  Assets  Assets: Communication Skills  Sleep  Sleep: Sleep: Good  Physical Exam: Physical Exam Constitutional:      Appearance: Normal appearance.  HENT:     Head: Normocephalic.     Nose: No  congestion or rhinorrhea.  Eyes:     Pupils: Pupils are equal, round, and reactive to light.  Pulmonary:  Effort: Pulmonary effort is normal.  Musculoskeletal:        General: Normal range of motion.     Cervical back: Normal range of motion.  Neurological:     Mental Status: He is alert.  Psychiatric:        Attention and Perception: Attention and perception normal.        Mood and Affect: Mood normal.        Speech: Speech normal.        Behavior: Behavior normal. Behavior is cooperative.        Thought Content: Thought content normal. Thought content does not include homicidal or suicidal ideation. Thought content does not include suicidal plan.        Cognition and Memory: Cognition and memory normal.        Judgment: Judgment normal.    Review of Systems  Constitutional: Negative.   HENT: Negative.    Eyes: Negative.   Respiratory: Negative.    Cardiovascular: Negative.   Gastrointestinal: Negative.   Musculoskeletal:  Positive for back pain.  Skin: Negative.   Neurological: Negative.   Psychiatric/Behavioral:  Positive for depression (Reports that depressive symptoms have significantly reduced. Denies SI/HI/AVH and verbally contracts for safety outside of this [facility]) and substance abuse. Negative for hallucinations, memory loss and suicidal ideas. The patient is not nervous/anxious and does not have insomnia.    Blood pressure 99/74, pulse 74, temperature (!) 97.5 F (36.4 C), temperature source Oral, resp. rate 16, height 6' (1.829 m), weight 79.4 kg, SpO2 100 %. Body mass index is 23.73 kg/m.   Treatment Plan Summary: Daily contact with patient to assess and evaluate symptoms and progress in treatment  Plan  Safety and Monitoring: - Voluntary admission to inpatient psychiatric unit for safety, stabilization and treatment - Daily contact with patient to assess and evaluate symptoms and progress in treatment - Patient's case to be discussed in multi-disciplinary  team meeting - Observation Level : q15 minute checks - Vital signs:  q12 hours - Precautions: suicide, elopement, and assault  Major depressive disorder, recurrent, severe without psychotic features Patient to continue taking Cymbalta 60 mg daily Patient to continue taking Remeron 7.5 mg at bedtime  Generalized anxiety disorder - Patient to continue taking Cymbalta 60 mg daily for the management of his anxiety - Patient to continue taking her Remeron 7.5 mg daily for the management of his anxiety - Patient to continue taking hydroxyzine 50 mg every 6 hours as needed for the management of his anxiety - Patient to continue taking BuSpar 5 mg 2 times daily for the management of his anxiety  Alcohol use disorder/stimulant use disorder (cocaine use) -[facility] detox protocol completed - Patient to continue taking thiamine 100 mg daily - Patient to continue taking multivitamin with minerals daily - Patient plans on being admitted to a residential rehab facility following-[facility] on Tuesday 09/25/2022  discharge from this facility - Patient to continue Keppra 500 mg BID for seizure prevention -Continue Gabapentin to 400 mg TID for alcohol use cravings  Tobacco use disorder - Nicotine patch 14 mg / 24 hours - Smoking cessation encouraged  GERD - Patient to continue taking Protonix EC 40 mg daily  Seizure disorder by history - Patient placed on seizure precautions - Patient to continue taking Keppra 500 mg 2 times daily   Vitamin B12 deficiency - Patient to continue taking vitamin B12 1000 mcg daily  Discharge Planning:  - Social work and case management to assist with discharge planning and  identification of hospital follow-up needs prior to discharge - Estimated d/c Tuesday, patient is currently researching different residential treatment programs facilities within the area - Discharge Concerns: Need to establish a safety plan; Medication compliance and effectiveness - Discharge  Goals: Return home with outpatient referrals for mental health follow-up including medication management/psychotherapy    Nicholes Rough, NP 09/24/2022, 11:58 AMPatient ID: John Vaughan, male   DOB: 1991-05-11, 31 y.o.   MRN: [id number] Patient ID: John Vaughan, male   DOB: January 28, 1991, 31 y.o.   MRN: [id number]

## 2022-09-24 NOTE — Group Note (Signed)
LCSW Group Therapy Note   Group Date: 09/24/2022 Start Time: 1300 End Time: 1400  Type of Therapy and Topic:  Group Therapy:  Stress Management   Participation Level:  Active    Description of Group:  Patients in this group were introduced to the idea of stress and encouraged to discuss negative and positive ways to manage stress. Patients discussed specific stressors that they have in their life right now and the physical signs and symptoms associated with that stress.  Patient encouraged to come up with positive changes to assist with the stress upon discharge in order to prevent future hospitalizations.   They also worked as a group on developing a specific plan for several patients to deal with stressors through [place], psychoeducation and self care techniques   Therapeutic Goals:               1)  To discuss the positive and negative impacts of stress             2)  identify signs and symptoms of stress             3)  generate ideas for stress management             4)  offer mutual support to others regarding stress management             5)  Developing plans for ways to manage specific stressors upon discharge               Summary of Patient Progress: The Pt attended group and remained there the entire time.  The Pt stated that new places and crowds are stressful for him and is something that he would like to work on in the future.  The Pt accepted all worksheets that were provided, was appropriate with their peers, and was open to the discussion.    Therapeutic Modalities:   Motivational Interviewing Brief Solution-Focused Therapy  Darleen Crocker, [place] 09/24/2022  2:08 PM

## 2022-09-24 NOTE — Group Note (Unsigned)
Date:  09/24/2022 Time:  9:49 AM  Group Topic/Focus:  Goals Group:   The focus of this group is to help patients establish daily goals to achieve during treatment and discuss how the patient can incorporate goal setting into their daily lives to aide in recovery. Orientation:   The focus of this group is to educate the patient on the purpose and policies of crisis stabilization and provide a format to answer questions about their admission.  The group details unit policies and expectations of patients while admitted.     Participation Level:  {BHH PARTICIPATION LEVEL:22264}  Participation Quality:  {BHH PARTICIPATION QUALITY:22265}  Affect:  {BHH AFFECT:22266}  Cognitive:  {BHH COGNITIVE:22267}  Insight: {BHH Insight2:20797}  Engagement in Group:  {BHH ENGAGEMENT IN GROUP:22268}  Modes of Intervention:  {BHH MODES OF INTERVENTION:22269}  Additional Comments:  ***  John Vaughan 09/24/2022, 9:49 AM  

## 2022-09-24 NOTE — B[facility] Suicide Risk Assessment (Signed)
Suicide Risk Assessment  Discharge Assessment    [facility] Discharge Suicide Risk Assessment   Principal Problem: MDD (major depressive disorder), recurrent episode (HCC) Discharge Diagnoses: Principal Problem:   MDD (major depressive disorder), recurrent episode (HCC) Active Problems:   GAD (generalized anxiety disorder)   Alcohol use disorder, severe, dependence (HCC)   Alcohol withdrawal (HCC)   Suicidal ideation   Tobacco abuse   Cocaine abuse (HCC)   Substance-induced disorder (HCC)  HPI:  John Vaughan is a 31 year old, Caucasian, male with a past psychiatric history significant for major depressive disorder versus bipolar 2 disorder versus substance induced mood disorder, alcohol and cocaine abuse, and generalized anxiety disorder who voluntarily presented to [facility] for suicidal ideation with a plan to cut his wrist in the context of worsening depressive symptoms and substance use.  Upon admission, patient alcohol level was 346 and his UDS was positive for cocaine.  Patient was admitted voluntarily to this [facility] for treatment and stabilization of his mood.                                           HOSPITAL COURSE During the patient's hospitalization, patient had extensive initial psychiatric evaluation, and follow-up psychiatric evaluations every day. Psychiatric diagnoses provided upon initial assessment were as follows: Principal Problem:   MDD (major depressive disorder), recurrent episode (HCC) Active Problems:   GAD (generalized anxiety disorder)   Alcohol use disorder, severe, dependence (HCC)   Alcohol withdrawal (HCC)   Suicidal ideation   Tobacco abuse   Cocaine abuse (HCC)   Substance-induced disorder (HCC)  Patient's psychiatric medications were adjusted on admission as follows: Psychiatric Diagnoses and Treatment:              MDD, recurrent, severe without psychotic features (r/o SIMD) GAD by hx R/o PTSD -- Continue Cymbalta and titrate up to  60mg  which was his home  dose -- Discussed start of Remeron 7.5mg  qhs to help with sleep, appetite and mood and he consents to med trial after r/b/se/a to med were reviewed -- Continue Neurontin 300mg  and increase to home dose of tid -- Continue Trazodone 100mg  qhs PRN insomnia -- Change vistaril to 50mg  q6 hours PRN anxiety -- Continue Buspar 5mg  bid started prior to admission      Alcohol use disorder Stimulant use disorder - cocaine type -- CIWA with scheduled change to Librium taper with PRN Librium for scores > 10 -- Continue thiamine 100 mg daily. -- Continue multivitamin with minerals daily -- Discussed option of residential rehab or [facility] and he will consider -- Continue Keppra and Neurontin for seizure prevention  -- Nicotine patch 14mg /24 hours ordered  -- Smoking cessation encouraged   GERD --Continue Protonix EC 40 mg daily   Seizure d/o by hx -- Placed on seizure precautions -- Continue Keppra 500mg  bid and Neurontin 300mg  tid   Questionable Vitamin B12 deficiency -- restarted home Vit B12 daily   During the hospitalization, other adjustments were made to the patient's psychiatric medication regimen, with medications at discharge being as follows: Major depressive disorder, recurrent, severe without psychotic features Patient to continue taking Cymbalta 60 mg daily Patient to continue taking Remeron 7.5 mg at bedtime   Generalized anxiety disorder - Patient to continue taking Cymbalta 60 mg daily for the management of his anxiety - Patient to continue taking her Remeron 7.5 mg daily  for the management of his anxiety - Patient to continue taking hydroxyzine 50 mg every 6 hours as needed for the management of his anxiety - Patient to continue taking BuSpar 5 mg 2 times daily for the management of his anxiety   Alcohol use disorder/stimulant use disorder (cocaine use) -Librium detox protocol completed - Patient to continue taking thiamine 100 mg daily -  Patient to continue taking multivitamin with minerals daily - Patient plans on being admitted to a residential rehab facility following-[facility] on Tuesday 09/25/2022  discharge from this facility - Patient to continue Keppra 500 mg BID for seizure prevention -Continue Gabapentin to 400 mg TID for alcohol use cravings  Tobacco use disorder - Nicotine patch 14 mg / 24 hours - Smoking cessation encouraged  GERD - Patient to continue taking Protonix EC 40 mg daily   Seizure disorder by history - Patient placed on seizure precautions - Patient to continue taking Keppra 500 mg 2 times daily    Vitamin B12 deficiency - Patient to continue taking vitamin B12 1000 mcg daily  Patient's care was discussed during the interdisciplinary team meeting every day during the hospitalization. The patient denies having side effects to prescribed psychiatric medication. The patient was evaluated each day by a clinical provider to ascertain response to treatment. Improvement was noted by the patient's report of decreasing symptoms, improved sleep and appetite, affect, medication tolerance, behavior, and participation in unit programming.  Patient was asked each day to complete a self inventory noting mood, mental status, pain, new symptoms, anxiety and concerns.    Symptoms were reported as significantly decreased or resolved completely by discharge. On day of discharge, the patient reports that their mood is stable. The patient denied having suicidal thoughts for more than 48 hours prior to discharge.  Patient denies having homicidal thoughts.  Patient denies having auditory hallucinations.  Patient denies any visual hallucinations or other symptoms of psychosis. The patient was motivated to continue taking medication with a goal of continued improvement in mental health.   The patient reports their target psychiatric symptoms of depression, anxiety and insomnia responded well to the psychiatric medications, and the  patient reports overall benefit other psychiatric hospitalization. Supportive psychotherapy was provided to the patient. The patient also participated in regular group therapy while hospitalized. Coping skills, problem solving as well as relaxation therapies were also part of the unit programming.  Labs were reviewed with the patient, and abnormal results were discussed with the patient.  Total Time spent with patient: 30 minutes  Musculoskeletal: Strength & Muscle Tone: within normal limits Gait & Station: normal Patient leans: N/A  Psychiatric Specialty Exam  Presentation  General Appearance:  Appropriate for Environment; Fairly Groomed  Eye Contact: Good  Speech: Clear and Coherent  Speech Volume: Normal  Handedness: Right  Mood and Affect  Mood: Euthymic  Duration of Depression Symptoms: Greater than two weeks  Affect: Appropriate; Congruent  Thought Process  Thought Processes: Coherent  Descriptions of Associations:Intact  Orientation:Full (Time, Place and Person)  Thought Content:Logical  History of Schizophrenia/Schizoaffective disorder:No  Duration of Psychotic Symptoms:N/A  Hallucinations:Hallucinations: None  Ideas of Reference:None  Suicidal Thoughts:Suicidal Thoughts: No  Homicidal Thoughts:Homicidal Thoughts: No  Sensorium  Memory: Immediate Good  Judgment: Good  Insight: Good  Executive Functions  Concentration: Good  Attention Span: Good  Recall: Good  Fund of Knowledge: Good  Language: Good  Psychomotor Activity  Psychomotor Activity: Psychomotor Activity: Normal  Assets  Assets: Communication Skills  Sleep  Sleep: Sleep: Good  Physical Exam: Physical Exam Constitutional:      Appearance: Normal appearance.  HENT:     Head: Normocephalic.     Nose: Nose normal. No congestion or rhinorrhea.  Eyes:     Pupils: Pupils are equal, round, and reactive to light.  Musculoskeletal:        General:  Normal range of motion.     Cervical back: Normal range of motion.  Neurological:     Mental Status: He is alert and oriented to person, place, and time.  Psychiatric:        Behavior: Behavior normal.        Thought Content: Thought content normal.    Review of Systems  Constitutional: Negative.   HENT: Negative.    Eyes: Negative.   Respiratory: Negative.    Cardiovascular: Negative.   Gastrointestinal: Negative.   Genitourinary: Negative.   Musculoskeletal: Negative.   Skin: Negative.   Neurological: Negative.   Psychiatric/Behavioral:  Positive for depression (Pt denies SI/HI/AVH, denies paranoia, no delusional thoughts, contracts for safety outside of [facility]) and substance abuse. Negative for hallucinations, memory loss and suicidal ideas. The patient is not nervous/anxious and does not have insomnia (insomnia resolving on current medications).    Blood pressure 99/74, pulse 74, temperature (!) 97.5 F (36.4 C), temperature source Oral, resp. rate 16, height 6' (1.829 m), weight 79.4 kg, SpO2 100 %. Body mass index is 23.73 kg/m.  Mental Status Per Nursing Assessment::   On Admission:  Suicidal ideation indicated by patient  Demographic Factors:  Male  Loss Factors: Financial problems/change in socioeconomic status  Historical Factors: NA  Risk Reduction Factors:   Positive coping skills or problem solving skills  Continued Clinical Symptoms:  Patient reports that his depressive symptoms have significantly  Cognitive Features That Contribute To Risk:  None    Suicide Risk:  Minimal: No identifiable suicidal ideation.  Patients presenting with no risk factors but with morbid ruminations; may be classified as minimal risk based on the severity of the depressive symptoms   Follow-up [facility]. Go to.   Specialty: Behavioral Health Why: Please go to this provider for an assessment, to obtain therapy and medication  management services on Monday or Wednesday, arrive by 7:30 am.  Services are provided on a first come, first served basis. Contact information: [place] [phone]        Services, [facility]. Go on 09/25/2022.   Why: You have been accepted to facility to start residential treatment. Contact information: [address] [place] [address] [phone]                Plan Of Care/Follow-up recommendations:  The patient is able to verbalize their individual safety plan to this provider.  # It is recommended to the patient to continue psychiatric medications as prescribed, after discharge from the hospital.    # It is recommended to the patient to follow up with your outpatient psychiatric provider and PCP.  # It was discussed with the patient, the impact of alcohol, drugs, tobacco have been there overall psychiatric and medical wellbeing, and total abstinence from substance use was recommended the patient.ed.  # Prescriptions provided or sent directly to preferred pharmacy at discharge. Patient agreeable to plan. Given opportunity to ask questions. Appears to feel comfortable with discharge.    # In the event of worsening symptoms, the patient is instructed to call the crisis  hotline (988), 911 and or go to the nearest ED for appropriate evaluation and treatment of symptoms. To follow-up with primary care provider for other medical issues, concerns and or health care needs  # Patient was discharged to [facility] treatment services with a plan to follow up as noted above.   Starleen Blue, NP 09/24/2022, 5:18 PM

## 2022-09-24 NOTE — Group Note (Unsigned)
Date:  09/24/2022 Time:  10:05 AM  Group Topic/Focus:  Goals Group:   The focus of this group is to help patients establish daily goals to achieve during treatment and discuss how the patient can incorporate goal setting into their daily lives to aide in recovery.     Participation Level:  {BHH PARTICIPATION LEVEL:22264}  Participation Quality:  {BHH PARTICIPATION QUALITY:22265}  Affect:  {BHH AFFECT:22266}  Cognitive:  {BHH COGNITIVE:22267}  Insight: {BHH Insight2:20797}  Engagement in Group:  {BHH ENGAGEMENT IN GROUP:22268}  Modes of Intervention:  {BHH MODES OF INTERVENTION:22269}  Additional Comments:  ***  [name] [name] 09/24/2022, 10:05 AM  

## 2022-09-24 NOTE — Group Note (Signed)
Occupational Therapy Group Note  Group Topic:Coping Skills  Group Date: 09/24/2022 Start Time: 1400 End Time: 1440 Facilitators: Brantley Stage, OT   Group Description: Group encouraged increased engagement and participation through discussion and activity focused on "Coping Ahead." Patients were split up into teams and selected a card from a stack of positive coping strategies. Patients were instructed to act out/charade the coping skill for other peers to guess and receive points for their team. Discussion followed with a focus on identifying additional positive coping strategies and patients shared how they were going to cope ahead over the weekend while continuing hospitalization stay.  Therapeutic Goal(s): Identify positive vs negative coping strategies. Identify coping skills to be used during hospitalization vs coping skills outside of hospital/at home Increase participation in therapeutic group environment and promote engagement in treatment   Participation Level: Active   Participation Quality: Independent   Behavior: Cooperative   Speech/Thought Process: Relevant   Affect/Mood: Appropriate   Insight: Good   Judgement: Good   Individualization: pt was active in their participation of group discussion/activity. New skills were identified  Modes of Intervention: Discussion and Education  Patient Response to Interventions:  Attentive   Plan: Continue to engage patient in OT groups 2 - 3x/week.  09/24/2022  Brantley Stage, OT Cornell Barman, OT

## 2022-09-24 NOTE — BHH Counselor (Signed)
The Pt informed the CSW that he has an upcoming court date on 09/25/2022 for Child Support.  CSW sent a letter to the [place] on the [employer].  CSW will include a copy of this letter in the Pt's discharge packet at the time of discharge.

## 2022-09-24 NOTE — Group Note (Signed)
Recreation Therapy Group Note   Group Topic:Stress Management  Group Date: 09/24/2022 Start Time: 0935 End Time: 0955 Facilitators: Rosena Bartle-McCall, LRT,CTRS Location: [address] Dayroom   Goal Area(s) Addresses:  Patient will identify positive stress management techniques. Patient will identify benefits of using stress management post d/c.   Group Description:  Meditation.  LRT played a meditation for patients from the Calm app that focused on strengthening resilience during tough situations to create peace moving forward.  Patients were to listen and follow along as meditation played to fully engage in the concept being offered.  LRT and patients discussed other areas to engage in stress management such as Youtube, scripts, apps and the Internet.     Affect/Mood: Appropriate   Participation Level: Engaged   Participation Quality: Independent   Behavior: Appropriate   Speech/Thought Process: Focused   Insight: Good   Judgement: Good   Modes of Intervention: App   Patient Response to Interventions:  Engaged   Education Outcome:  Acknowledges education and In group clarification offered    Clinical Observations/Individualized Feedback: Pt attended and participated in group session.     Plan: Continue to engage patient in RT group sessions 2-3x/week.   Avory Rahimi-McCall, LRT,CTRS 09/24/2022 12:13 PM

## 2022-09-24 NOTE — Progress Notes (Signed)
   09/24/22 2015  Psych Admission Type (Psych Patients Only)  Admission Status Voluntary  Psychosocial Assessment  Patient Complaints Anxiety  Eye Contact Fair  Facial Expression Animated;Anxious  Affect Appropriate to circumstance  Speech Logical/coherent  Interaction Assertive  Motor Activity Slow  Appearance/Hygiene Unremarkable  Behavior Characteristics Appropriate to situation  Mood Anxious;Pleasant  Aggressive Behavior  Effect No apparent injury  Thought Process  Coherency WDL  Content WDL  Delusions WDL  Perception WDL  Hallucination None reported or observed  Judgment WDL  Confusion None  Danger to Self  Current suicidal ideation? Denies  Danger to Others  Danger to Others None reported or observed

## 2022-09-24 NOTE — Progress Notes (Addendum)
D/C paperwork gone over with patient with understanding. Pt completed suicide safety plan and copy placed with pt D/C paperwork and original placed in chart.

## 2022-09-24 NOTE — Progress Notes (Signed)
  [facility] Adult Case Management Discharge Plan :  Will you be returning to the same living situation after discharge:  No. [facility]  At discharge, do you have transportation home?: Yes,  Taxi  Do you have the ability to pay for your medications: Yes,  Community and Family support  Release of information consent forms completed and in the chart;  Patient's signature needed at discharge.  Patient to Follow up at:  [facility]. Go to.   Specialty: Behavioral Health Why: Please go to this provider for an assessment, to obtain therapy and medication management services on Monday or Wednesday, arrive by 7:30 am.  Services are provided on a first come, first served basis. Contact information: [place] [phone]        Services, [facility]. Go on 09/25/2022.   Why: You have been accepted to facility to start residential treatment. Contact information: [place] [address] [phone]                 Next level of care provider has access to [facility] and Suicide Prevention discussed: Yes,  with patient and mother      Has patient been referred to the Quitline?: Patient refused referral  Patient has been referred for addiction treatment: Yes  Darleen Crocker, LCSWA 09/24/2022, 3:03 PM

## 2022-09-24 NOTE — Progress Notes (Signed)
Pt compliant with medication. Pt did not attend all groups but was on unit and engaged most of the time. Pt denies SI/HI/self harm thoughts as well as a/v hallucinations. Q 15 minute checks ongoing.

## 2022-09-25 DIAGNOSIS — F331 Major depressive disorder, recurrent, moderate: Secondary | ICD-10-CM

## 2022-09-25 LAB — HEPATITIS PANEL, ACUTE: Hep A IgM: NEGATIVE — AB
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
## 2022-09-25 NOTE — Progress Notes (Signed)
D: Patient verbalizes readiness for discharge. Denies suicidal and homicidal ideations. Denies auditory and visual hallucinations.  No complaints of pain. Suicidal safety plan completed and copy placed in the chart.  A:  Patient receptive to discharge instructions. Questions encouraged, pt verbalize understanding.   R:  Escorted to the lobby by [facility] staff.

## 2022-09-25 NOTE — Progress Notes (Signed)
   09/25/22 0530  Sleep  Number of Hours 8

## 2022-09-25 NOTE — [facility] Group Notes (Signed)
Spiritual care group on grief and loss facilitated by chaplain Katy Willa Brocks, [facility]   Group Goal:   Support / Education around grief and loss   Members engage in facilitated group support and psycho-social education.   Group Description:   Following introductions and group rules, group members engaged in facilitated group dialog and support around topic of loss, with particular support around experiences of loss in their lives. Group Identified types of loss (relationships / self / things) and identified patterns, circumstances, and changes that precipitate losses. Reflected on thoughts / feelings around loss, normalized grief responses, and recognized variety in grief experience. Group noted Worden's four tasks of grief in discussion.   Group drew on Adlerian / Rogerian, narrative, MI,   Patient Progress: Did not attend.  

## 2022-12-15 ENCOUNTER — Other Ambulatory Visit: Payer: Self-pay | Admitting: Psychiatry

## 2022-12-15 ENCOUNTER — Emergency Department (HOSPITAL_BASED_OUTPATIENT_CLINIC_OR_DEPARTMENT_OTHER)
Admission: EM | Admit: 2022-12-15 | Discharge: 2022-12-15 | Disposition: A | Discharge status: Other Acute Inpatient Hospital | Payer: Self-pay | Attending: Emergency Medicine | Admitting: Emergency Medicine

## 2022-12-15 ENCOUNTER — Inpatient Hospital Stay (HOSPITAL_COMMUNITY)
Admission: AD | Admit: 2022-12-15 | Discharge: 2022-12-20 | DRG: 885 | Disposition: A | Discharge status: Home / Self Care | Payer: Federal, State, Local not specified - Other | Source: Intra-hospital | Attending: Psychiatry | Admitting: Psychiatry

## 2022-12-15 ENCOUNTER — Other Ambulatory Visit: Payer: Self-pay

## 2022-12-15 ENCOUNTER — Encounter (HOSPITAL_BASED_OUTPATIENT_CLINIC_OR_DEPARTMENT_OTHER): Payer: Self-pay | Admitting: Emergency Medicine

## 2022-12-15 DIAGNOSIS — F1414 Cocaine abuse with cocaine-induced mood disorder: Secondary | ICD-10-CM | POA: Diagnosis present

## 2022-12-15 DIAGNOSIS — I252 Old myocardial infarction: Secondary | ICD-10-CM | POA: Diagnosis not present

## 2022-12-15 DIAGNOSIS — Z9151 Personal history of suicidal behavior: Secondary | ICD-10-CM

## 2022-12-15 DIAGNOSIS — G40909 Epilepsy, unspecified, not intractable, without status epilepticus: Secondary | ICD-10-CM | POA: Diagnosis present

## 2022-12-15 DIAGNOSIS — R41843 Psychomotor deficit: Secondary | ICD-10-CM | POA: Diagnosis present

## 2022-12-15 DIAGNOSIS — Z811 Family history of alcohol abuse and dependence: Secondary | ICD-10-CM

## 2022-12-15 DIAGNOSIS — Z79899 Other long term (current) drug therapy: Secondary | ICD-10-CM

## 2022-12-15 DIAGNOSIS — Z91411 Personal history of adult psychological abuse: Secondary | ICD-10-CM

## 2022-12-15 DIAGNOSIS — Z1152 Encounter for screening for COVID-19: Secondary | ICD-10-CM

## 2022-12-15 DIAGNOSIS — G4710 Hypersomnia, unspecified: Secondary | ICD-10-CM | POA: Diagnosis present

## 2022-12-15 DIAGNOSIS — Z91410 Personal history of adult physical and sexual abuse: Secondary | ICD-10-CM

## 2022-12-15 DIAGNOSIS — R45851 Suicidal ideations: Secondary | ICD-10-CM | POA: Diagnosis present

## 2022-12-15 DIAGNOSIS — F1410 Cocaine abuse, uncomplicated: Secondary | ICD-10-CM | POA: Diagnosis present

## 2022-12-15 DIAGNOSIS — Y908 Blood alcohol level of 240 mg/100 ml or more: Secondary | ICD-10-CM | POA: Insufficient documentation

## 2022-12-15 DIAGNOSIS — F10239 Alcohol dependence with withdrawal, unspecified: Secondary | ICD-10-CM | POA: Diagnosis not present

## 2022-12-15 DIAGNOSIS — F22 Delusional disorders: Secondary | ICD-10-CM | POA: Diagnosis present

## 2022-12-15 DIAGNOSIS — F410 Panic disorder [episodic paroxysmal anxiety] without agoraphobia: Secondary | ICD-10-CM | POA: Diagnosis present

## 2022-12-15 DIAGNOSIS — F4000 Agoraphobia, unspecified: Secondary | ICD-10-CM | POA: Diagnosis present

## 2022-12-15 DIAGNOSIS — F332 Major depressive disorder, recurrent severe without psychotic features: Secondary | ICD-10-CM | POA: Diagnosis present

## 2022-12-15 DIAGNOSIS — Z818 Family history of other mental and behavioral disorders: Secondary | ICD-10-CM | POA: Diagnosis not present

## 2022-12-15 DIAGNOSIS — F411 Generalized anxiety disorder: Secondary | ICD-10-CM | POA: Diagnosis present

## 2022-12-15 DIAGNOSIS — F17210 Nicotine dependence, cigarettes, uncomplicated: Secondary | ICD-10-CM | POA: Diagnosis present

## 2022-12-15 DIAGNOSIS — K219 Gastro-esophageal reflux disease without esophagitis: Secondary | ICD-10-CM | POA: Diagnosis present

## 2022-12-15 DIAGNOSIS — Z720 Tobacco use: Secondary | ICD-10-CM | POA: Diagnosis present

## 2022-12-15 DIAGNOSIS — F1090 Alcohol use, unspecified, uncomplicated: Secondary | ICD-10-CM | POA: Insufficient documentation

## 2022-12-15 DIAGNOSIS — Z8673 Personal history of transient ischemic attack (TIA), and cerebral infarction without residual deficits: Secondary | ICD-10-CM

## 2022-12-15 DIAGNOSIS — Z87828 Personal history of other (healed) physical injury and trauma: Secondary | ICD-10-CM

## 2022-12-15 DIAGNOSIS — F1910 Other psychoactive substance abuse, uncomplicated: Secondary | ICD-10-CM

## 2022-12-15 DIAGNOSIS — Z20822 Contact with and (suspected) exposure to covid-19: Secondary | ICD-10-CM | POA: Insufficient documentation

## 2022-12-15 DIAGNOSIS — F1020 Alcohol dependence, uncomplicated: Secondary | ICD-10-CM | POA: Diagnosis present

## 2022-12-15 DIAGNOSIS — F4010 Social phobia, unspecified: Secondary | ICD-10-CM | POA: Diagnosis present

## 2022-12-15 DIAGNOSIS — Z9152 Personal history of nonsuicidal self-harm: Secondary | ICD-10-CM

## 2022-12-15 DIAGNOSIS — G4700 Insomnia, unspecified: Secondary | ICD-10-CM | POA: Diagnosis present

## 2022-12-15 LAB — CBC WITH DIFFERENTIAL/PLATELET
Abs Immature Granulocytes: 0.03 10*3/uL (ref 0.00–0.07)
Basophils Absolute: 0 10*3/uL (ref 0.0–0.1)
Basophils Relative: 1 %
Eosinophils Absolute: 0.1 10*3/uL (ref 0.0–0.5)
Eosinophils Relative: 1 %
HCT: 45.7 % (ref 39.0–52.0)
Hemoglobin: 16 g/dL (ref 13.0–17.0)
Immature Granulocytes: 1 %
Lymphocytes Relative: 49 %
Lymphs Abs: 2.3 10*3/uL (ref 0.7–4.0)
MCH: 32.1 pg (ref 26.0–34.0)
MCHC: 35 g/dL (ref 30.0–36.0)
MCV: 91.6 fL (ref 80.0–100.0)
Monocytes Absolute: 0.5 10*3/uL (ref 0.1–1.0)
Monocytes Relative: 12 %
Neutro Abs: 1.6 10*3/uL — ABNORMAL LOW (ref 1.7–7.7)
Neutrophils Relative %: 36 %
Platelets: 298 10*3/uL (ref 150–400)
RBC: 4.99 MIL/uL (ref 4.22–5.81)
RDW: 12.4 % (ref 11.5–15.5)
WBC: 4.5 10*3/uL (ref 4.0–10.5)
nRBC: 0 % (ref 0.0–0.2)

## 2022-12-15 LAB — COMPREHENSIVE METABOLIC PANEL
ALT: 21 U/L (ref 0–44)
AST: 23 U/L (ref 15–41)
Albumin: 4.6 g/dL (ref 3.5–5.0)
Alkaline Phosphatase: 64 U/L (ref 38–126)
Anion gap: 10 (ref 5–15)
BUN: 11 mg/dL (ref 6–20)
CO2: 23 mmol/L (ref 22–32)
Calcium: 9 mg/dL (ref 8.9–10.3)
Chloride: 108 mmol/L (ref 98–111)
Creatinine, Ser: 0.83 mg/dL (ref 0.61–1.24)
GFR, Estimated: >60 mL/min (ref >60)
Glucose, Bld: 105 mg/dL — ABNORMAL HIGH (ref 70–99)
Potassium: 4 mmol/L (ref 3.5–5.1)
Sodium: 141 mmol/L (ref 135–145)
Total Bilirubin: 0.5 mg/dL (ref 0.3–1.2)
Total Protein: 8.6 g/dL — ABNORMAL HIGH (ref 6.5–8.1)

## 2022-12-15 LAB — RESP PANEL BY RT-PCR (RSV, FLU A&B, COVID)  RVPGX2
Influenza A by PCR: NEGATIVE
Influenza B by PCR: NEGATIVE
Resp Syncytial Virus by PCR: NEGATIVE
SARS Coronavirus 2 by RT PCR: NEGATIVE

## 2022-12-15 LAB — RAPID URINE DRUG SCREEN, HOSP PERFORMED
Amphetamines: NOT DETECTED
Barbiturates: NOT DETECTED
Benzodiazepines: NOT DETECTED
Cocaine: POSITIVE — AB
Opiates: NOT DETECTED
Tetrahydrocannabinol: NOT DETECTED

## 2022-12-15 LAB — ETHANOL: Alcohol, Ethyl (B): 281 mg/dL — ABNORMAL HIGH (ref <10)

## 2022-12-15 LAB — MAGNESIUM: Magnesium: 2.3 mg/dL (ref 1.7–2.4)

## 2022-12-15 LAB — LIPASE, BLOOD: Lipase: 39 U/L (ref 11–51)

## 2022-12-15 MED ORDER — SODIUM CHLORIDE 0.9 % IV BOLUS
1000.0000 mL | Freq: Once | INTRAVENOUS | Status: AC
  Administered 2022-12-15: 1000 mL via INTRAVENOUS

## 2022-12-15 MED ORDER — LEVETIRACETAM 500 MG PO TABS
500.0000 mg | ORAL_TABLET | Freq: Two times a day (BID) | ORAL | Status: DC
  Administered 2022-12-15 (×2): 500 mg via ORAL
  Filled 2022-12-15 (×2): qty 1

## 2022-12-15 MED ORDER — BUSPIRONE HCL 5 MG PO TABS
5.0000 mg | ORAL_TABLET | Freq: Two times a day (BID) | ORAL | Status: DC
  Filled 2022-12-15: qty 1

## 2022-12-15 MED ORDER — PANTOPRAZOLE SODIUM 40 MG IV SOLR: 40.0000 mg | Freq: Once | INTRAVENOUS | Status: DC

## 2022-12-15 MED ORDER — FOLIC ACID 1 MG PO TABS
1.0000 mg | ORAL_TABLET | Freq: Every day | ORAL | Status: DC
  Administered 2022-12-15: 1 mg via ORAL
  Filled 2022-12-15: qty 1

## 2022-12-15 MED ORDER — LORAZEPAM 2 MG/ML IJ SOLN
1.0000 mg | INTRAMUSCULAR | Status: DC | PRN
  Administered 2022-12-15: 1 mg via INTRAVENOUS
  Filled 2022-12-15: qty 1

## 2022-12-15 MED ORDER — NICOTINE 14 MG/24HR TD PT24
14.0000 mg | MEDICATED_PATCH | Freq: Once | TRANSDERMAL | Status: DC
  Administered 2022-12-15: 14 mg via TRANSDERMAL
  Filled 2022-12-15: qty 1

## 2022-12-15 MED ORDER — ONDANSETRON HCL 4 MG/2ML IJ SOLN
4.0000 mg | Freq: Once | INTRAMUSCULAR | Status: AC
  Administered 2022-12-15: 4 mg via INTRAVENOUS
  Filled 2022-12-15: qty 2

## 2022-12-15 MED ORDER — MAGNESIUM HYDROXIDE 400 MG/5ML PO SUSP: 30.0000 mL | Freq: Every day | ORAL | Status: DC | PRN

## 2022-12-15 MED ORDER — THIAMINE HCL 100 MG/ML IJ SOLN
100.0000 mg | Freq: Every day | INTRAMUSCULAR | Status: DC
  Filled 2022-12-15: qty 2

## 2022-12-15 MED ORDER — DULOXETINE HCL 60 MG PO CPEP
60.0000 mg | ORAL_CAPSULE | Freq: Every day | ORAL | Status: DC
  Filled 2022-12-15: qty 1

## 2022-12-15 MED ORDER — LORAZEPAM 1 MG PO TABS
1.0000 mg | ORAL_TABLET | ORAL | Status: DC | PRN
  Administered 2022-12-15 (×2): 1 mg via ORAL
  Filled 2022-12-15 (×2): qty 1

## 2022-12-15 MED ORDER — ALUM & MAG HYDROXIDE-SIMETH 200-200-20 MG/5ML PO SUSP: 30.0000 mL | ORAL | Status: DC | PRN

## 2022-12-15 MED ORDER — GABAPENTIN 300 MG PO CAPS
400.0000 mg | ORAL_CAPSULE | Freq: Three times a day (TID) | ORAL | Status: DC
  Administered 2022-12-15 (×3): 400 mg via ORAL
  Filled 2022-12-15 (×3): qty 1

## 2022-12-15 MED ORDER — ZIPRASIDONE MESYLATE 20 MG IM SOLR
INTRAMUSCULAR | Status: AC
  Filled 2022-12-15: qty 20

## 2022-12-15 MED ORDER — ACETAMINOPHEN 325 MG PO TABS: 650.0000 mg | ORAL_TABLET | Freq: Four times a day (QID) | ORAL | Status: DC | PRN

## 2022-12-15 MED ORDER — THIAMINE MONONITRATE 100 MG PO TABS
100.0000 mg | ORAL_TABLET | Freq: Every day | ORAL | Status: DC
  Administered 2022-12-15: 100 mg via ORAL
  Filled 2022-12-15: qty 1

## 2022-12-15 MED ORDER — PANTOPRAZOLE SODIUM 40 MG PO TBEC
40.0000 mg | DELAYED_RELEASE_TABLET | Freq: Every day | ORAL | Status: DC
  Administered 2022-12-15: 40 mg via ORAL
  Filled 2022-12-15: qty 1

## 2022-12-15 MED ORDER — ADULT MULTIVITAMIN W/MINERALS CH
1.0000 | ORAL_TABLET | Freq: Every day | ORAL | Status: DC
  Administered 2022-12-15: 1 via ORAL
  Filled 2022-12-15: qty 1

## 2022-12-15 MED ORDER — MIRTAZAPINE 7.5 MG PO TABS
7.5000 mg | ORAL_TABLET | Freq: Every day | ORAL | Status: DC
  Filled 2022-12-15: qty 1

## 2022-12-15 NOTE — ED Notes (Signed)
No sitter available for 7p-7a per staffing

## 2022-12-15 NOTE — ED Notes (Signed)
Pt was given his home meds per MD Wallace Cullens play. Pt took 1 500mg  Keppra, 1 40mg  Protonix, and 1 400 mg gabapentin. Pt took with water.

## 2022-12-15 NOTE — Care Management (Signed)
OP SA resources placed on DC summary AVS

## 2022-12-15 NOTE — Discharge Instructions (Addendum)
It was a pleasure caring for you today in the emergency department.  Please return to the emergency department for any worsening or worrisome symptoms.                  Intensive Outpatient Programs  [facility]    [facility] [address]     [address] [place],  [place]     [place], [place] [phone]      [phone]  [facility]   [facility]  (Inpatient and outpatient)  [phone] (Suboxone and Methadone) [address]           [phone]           [facility]    [facility] - Intensive Outpatient [address]     [address] [place], [place] [address]     [place], [place]  [phone]      [phone]  [facility] (Outpatient, Inpatient, [facility]  [facility] (Groups and Residental) (insurance only) [phone]    [place], [place]          [phone]       [facility]    [facility] (for caregivers and family) [address]     [address] [place], [place]     [place], [place] [phone]      [phone]  Residential Treatment Programs  [facility]  [place](2 years) Residential: 90 days)  [facility]      [address] [place], [place]     [place], [place] [phone]      [phone] or [phone]  [facility]    [facility] [address]      [address] [place], [place]     [place], [place] [phone]      [phone]  [facility]   [facility] ([facility]) [address]     [address] [place], [place] [address]     [place], [place] [phone]      [phone] Admissions: 8am-3pm M-F  [facility] Program: Residential Program [phone] Days)              [facility]: [facility]  [place], [place]     [place], [place]   [phone] or [phone]    (Walk in Hours over the weekend or by referral)   [facility]: [facility]:[phone] (for crisis response 24 hours a day)

## 2022-12-15 NOTE — ED Notes (Signed)
Pt gives consent for charge RN Gaspar Garbe to sign for his receipt for patient's home medications and personal belongings inventory sheet.

## 2022-12-15 NOTE — ED Notes (Signed)
Pt placed in hallway bed near locked EMS doors. Safety attendant at the bedside monitoring patient at this time.

## 2022-12-15 NOTE — ED Notes (Signed)
Pt belongings are locked in bottom cabinet in med room under mixing area. Pt had too many medications in a big grocery bag and would not fit in the pyxis.

## 2022-12-15 NOTE — ED Notes (Signed)
Pt unable to drink water at this time; he reports he is extremely nauseous and needs something for his withdrawals. Pt calm and cooperative. Offered ginger ale to pt earlier; refused.
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
## 2022-12-15 NOTE — ED Notes (Signed)
Pt given ginger ale and a frozen meal. Pt reports he feels much better after nausea medicine.

## 2022-12-15 NOTE — ED Provider Notes (Signed)
[facility] EMERGENCY DEPARTMENT Provider Note   CSN: [id number] Arrival date & time: 12/15/22  0136     History  Chief Complaint  Patient presents with   Alcohol Problem    John Vaughan is a 31 y.o. male.  Patient as above with significant medical history as below, including etoh abuse, polysubstance abuse, anxiety, bipolar 1, seizures who presents to the ED with complaint of si  Pt reports recent struggles with polysubstance abuse He has been drinking heavily etoh the last 2 weeks Having nausea and vomiting Has been using crack cocaine intermittently last few days Increasingly sad, depressed feeling; now has desire to kill himself Reports he will drive his car as fast as he can into a wall so he can die Prior self harm around 1 yr ago with wrist cutting Multiple psychiatric eval in the past w/ similar complaint Last etoh was earlier today 4-5 x40oz beers, usually drinks 2x 40oz beer daily Currently suicidal Having intermittent hallucinations Nausea and vomiting last 24-48 hours, diarrhea, reduced po intake      Past Medical History:  Diagnosis Date   Alcohol abuse    Anxiety    Bipolar 2 disorder (HCC)    Depression    Seizures (HCC)     Past Surgical History:  Procedure Laterality Date   HAND SURGERY Right      The history is provided by the patient. No language interpreter was used.  Alcohol Problem Pertinent negatives include no chest pain, no abdominal pain, no headaches and no shortness of breath.       Home Medications Prior to Admission medications   Medication Sig Start Date End Date Taking? Authorizing Provider  busPIRone (BUSPAR) 5 MG tablet Take 1 tablet (5 mg total) by mouth 2 (two) times daily. 09/24/22   Starleen Blue, NP  cyanocobalamin 1000 MCG tablet Take 1 tablet (1,000 mcg total) by mouth daily. 09/24/22   Starleen Blue, NP  DULoxetine (CYMBALTA) 60 MG capsule Take 1 capsule (60 mg total) by mouth daily. 09/25/22    Starleen Blue, NP  gabapentin (NEURONTIN) 400 MG capsule Take 1 capsule (400 mg total) by mouth 3 (three) times daily. 09/24/22   Starleen Blue, NP  levETIRAcetam (KEPPRA) 500 MG tablet Take 1 tablet (500 mg total) by mouth 2 (two) times daily. 09/24/22   Starleen Blue, NP  mirtazapine (REMERON) 7.5 MG tablet Take 1 tablet (7.5 mg total) by mouth at bedtime. 09/24/22   Nkwenti, Tyler Aas, NP  nicotine (NICODERM CQ - DOSED IN MG/24 HOURS) 14 mg/24hr patch Place 1 patch (14 mg total) onto the skin daily. 09/25/22   Starleen Blue, NP  pantoprazole (PROTONIX) 40 MG tablet Take 1 tablet (40 mg total) by mouth daily. 09/24/22   Starleen Blue, NP  traZODone (DESYREL) 100 MG tablet Take 1 tablet (100 mg total) by mouth at bedtime as needed for sleep. 09/24/22   Starleen Blue, NP      Allergies    Patient has no known allergies.    Review of Systems   Review of Systems  Constitutional:  Negative for chills and fever.  HENT:  Negative for facial swelling and trouble swallowing.   Eyes:  Negative for photophobia and visual disturbance.  Respiratory:  Negative for cough and shortness of breath.   Cardiovascular:  Negative for chest pain and palpitations.  Gastrointestinal:  Positive for nausea and vomiting. Negative for abdominal pain.  Endocrine: Negative for polydipsia and polyuria.  Genitourinary:  Negative for difficulty urinating  and hematuria.  Musculoskeletal:  Negative for gait problem and joint swelling.  Skin:  Negative for pallor and rash.  Neurological:  Negative for syncope and headaches.  Psychiatric/Behavioral:  Positive for sleep disturbance and suicidal ideas. Negative for agitation and confusion. The patient is nervous/anxious.     Physical Exam Updated Vital Signs BP 120/80 (BP Location: Right Arm)   Pulse 70   Temp 98 F (36.7 C) (Oral)   Resp 18   Ht 6' (1.829 m)   Wt 94.5 kg   SpO2 100%   BMI 28.26 kg/m  Physical Exam Vitals and nursing note reviewed.  Constitutional:       General: He is not in acute distress.    Appearance: Normal appearance. He is well-developed. He is not ill-appearing, toxic-appearing or diaphoretic.  HENT:     Head: Normocephalic and atraumatic.     Right Ear: External ear normal.     Left Ear: External ear normal.     Mouth/Throat:     Mouth: Mucous membranes are moist.  Eyes:     General: No scleral icterus. Cardiovascular:     Rate and Rhythm: Normal rate and regular rhythm.     Pulses: Normal pulses.     Heart sounds: Normal heart sounds.  Pulmonary:     Effort: Pulmonary effort is normal. No respiratory distress.     Breath sounds: Normal breath sounds.  Abdominal:     General: Abdomen is flat.     Palpations: Abdomen is soft.     Tenderness: There is no abdominal tenderness. There is no guarding or rebound.  Musculoskeletal:        General: Normal range of motion.     Cervical back: Normal range of motion.     Right lower leg: No edema.     Left lower leg: No edema.  Skin:    General: Skin is warm and dry.     Capillary Refill: Capillary refill takes less than 2 seconds.  Neurological:     Mental Status: He is alert and oriented to person, place, and time.  Psychiatric:        Attention and Perception: He perceives auditory and visual hallucinations.        Mood and Affect: Mood is depressed.        Behavior: Behavior is slowed. Behavior is cooperative.        Thought Content: Thought content includes suicidal ideation. Thought content does not include homicidal ideation. Thought content includes suicidal plan. Thought content does not include homicidal plan.     ED Results / Procedures / Treatments   Labs (all labs ordered are listed, but only abnormal results are displayed) Labs Reviewed  COMPREHENSIVE METABOLIC PANEL - Abnormal; Notable for the following components:      Result Value   Glucose, Bld 105 (*)    Total Protein 8.6 (*)    All other components within normal limits  ETHANOL - Abnormal;  Notable for the following components:   Alcohol, Ethyl (B) 281 (*)    All other components within normal limits  RAPID URINE DRUG SCREEN, HOSP PERFORMED - Abnormal; Notable for the following components:   Cocaine POSITIVE (*)    All other components within normal limits  CBC WITH DIFFERENTIAL/PLATELET - Abnormal; Notable for the following components:   Neutro Abs 1.6 (*)    All other components within normal limits  RESP PANEL BY RT-PCR (RSV, FLU A&B, COVID)  RVPGX2  LIPASE, BLOOD  MAGNESIUM  LEVETIRACETAM LEVEL    EKG EKG Interpretation  Date/Time:  Saturday December 15 2022 04:30:11 EST Ventricular Rate:  83 PR Interval:  170 QRS Duration: 100 QT Interval:  336 QTC Calculation: 394 R Axis:   45 Text Interpretation: Normal sinus rhythm Incomplete right bundle branch block Borderline ECG When compared with ECG of 25-Aug-2022 13:14, PREVIOUS ECG IS PRESENT similar to previous no stemi Confirmed by Tanda RockersGray, Carolanne Mercier ([id number]) on 12/15/2022 6:05:11 AM  Radiology No results found.  Procedures Procedures    Medications Ordered in ED Medications  pantoprazole (PROTONIX) injection 40 mg (40 mg Intravenous Not Given 12/15/22 0425)  LORazepam (ATIVAN) tablet 1-4 mg ( Oral See Alternative 12/15/22 0506)    Or  LORazepam (ATIVAN) injection 1-4 mg (1 mg Intravenous Given 12/15/22 0506)  thiamine (VITAMIN B1) tablet 100 mg (has no administration in time range)    Or  thiamine (VITAMIN B1) injection 100 mg (has no administration in time range)  folic acid (FOLVITE) tablet 1 mg (has no administration in time range)  multivitamin with minerals tablet 1 tablet (has no administration in time range)  nicotine (NICODERM CQ - dosed in mg/24 hours) patch 14 mg (has no administration in time range)  ondansetron (ZOFRAN) injection 4 mg (4 mg Intravenous Given 12/15/22 0502)  sodium chloride 0.9 % bolus 1,000 mL (1,000 mLs Intravenous New Bag/Given 12/15/22 0503)    ED Course/ Medical Decision  Making/ A&P                           Medical Decision Making Amount and/or Complexity of Data Reviewed Labs: ordered.  Risk OTC drugs. Prescription drug management.   Medical Decision Making:   John Vaughan is a 31 y.o. male who presented to the ED today with si, etoh abuse as detailed above.    External chart has been reviewed including prior ed visits, prior psych eval, prior labs/imaging/ home meds. Patient's presentation is complicated by their history of chronic etoh abuse, polysubstance abuse.  Complete initial physical exam performed, notably the patient  was resting comfortably, NAD, suicidal.    Reviewed and confirmed nursing documentation for past medical history, family history, social history.    Initial Assessment:   This patient presents to the ED with chief complaint(s) of suicidal, etoh abuse with pertinent past medical history of as above which further complicates the presenting complaint. The complaint involves an extensive differential diagnosis and also carries with it a high risk of complications and morbidity.  Serious etiology was considered. Ddx includes but not limited to psychiatric disturbance, substance induced mood disorder, malingering, intoxication, etc This is most consistent with an acute complicated illness  Initial Plan:  Medical clearance labs, tts eval  Screening labs including CBC and Metabolic panel to evaluate for infectious or metabolic etiology of disease.  EKG to evaluate for cardiac pathology, screening UDS/ethanol level/ bh labs Objective evaluation as below reviewed   Initial Study Results:   Laboratory  All laboratory results reviewed without evidence of clinically relevant pathology.   Ethanol elev, pt reports etoh use pta; low suspicion for acute withdrawal Lipase is WNL, cmp stable, cbc stable UDS +tive for cocaine, which he reports using regularly  RVP neg  EKG similar to prior, no stemi EKG was reviewed  independently.   Radiology:  Na  No results found.     Consults: [facility], recommends inpatient .   Final Assessment and Plan:   Pt  here with si and etoh abuse, polysubstance abuse, n/v. He is feeling better after assessment, was given 1L of fluids and anti-emetic, is able to tolerate po intake in the ED w/o difficulty. No abd pain. No episodes of diarrhea while in the ED. Medically he is feeling much better. Remains suicidal. He is medically cleared for psychiatric assessment, this was completed and it was recommended he enter inpatient treatment, pt is agreeable. Given acutely suicidal he was also placed under ivc.  Dispo pending placement per behavioral health.    Admission was considered    Clinical Impression:  1. Suicidal intent   2. Polysubstance abuse (HCC)      Data Unavailable       Social Determinants of health: Counseled patient for approximately 3 minutes regarding smoking cessation. Discussed risks of smoking and how they applied and affected their visit here today. Patient not ready to quit at this time, however will follow up with their primary doctor when they are.   CPT code: 65993: intermediate counseling for smoking cessation  Etoh abuse Cocaine abuse  Social History   Tobacco Use   Smoking status: Every Day    Packs/day: 1.50    Types: Cigarettes   Smokeless tobacco: Former    Types: Snuff   Tobacco comments:    Pt states plans to stop drinking. Will deal with tobacco cessation later.  Vaping Use   Vaping Use: Never used  Substance Use Topics   Alcohol use: Yes    Alcohol/week: 2.0 standard drinks of alcohol    Types: 2 Cans of beer per week    Comment: 48 beers a week   Drug use: Yes    Types: Cocaine                  This chart was dictated using voice recognition software.  Despite best efforts to proofread,  errors can occur which can change the documentation meaning.         Final Clinical  Impression(s) / ED Diagnoses Final diagnoses:  Suicidal intent  Polysubstance abuse [facility])    Rx / DC Orders ED Discharge Orders     None         Sloan Leiter, DO 12/15/22 5701

## 2022-12-15 NOTE — ED Triage Notes (Signed)
Pt has been attempting to quit using alcohol has had relapse, is here for withdrawal and detox. Last use 8 hours ago.

## 2022-12-16 ENCOUNTER — Encounter (HOSPITAL_COMMUNITY): Payer: Self-pay | Admitting: Psychiatry

## 2022-12-16 DIAGNOSIS — F332 Major depressive disorder, recurrent severe without psychotic features: Secondary | ICD-10-CM | POA: Diagnosis not present

## 2022-12-16 MED ORDER — LORAZEPAM 0.5 MG PO TABS
0.5000 mg | ORAL_TABLET | Freq: Every day | ORAL | Status: DC
  Administered 2022-12-20: 0.5 mg via ORAL
  Filled 2022-12-16: qty 1

## 2022-12-16 MED ORDER — ARIPIPRAZOLE 5 MG PO TABS
5.0000 mg | ORAL_TABLET | Freq: Every day | ORAL | Status: DC
  Administered 2022-12-17 – 2022-12-20 (×4): 5 mg via ORAL
  Filled 2022-12-16 (×4): qty 1
  Filled 2022-12-16: qty 7
  Filled 2022-12-16: qty 1

## 2022-12-16 MED ORDER — LEVETIRACETAM 500 MG PO TABS
500.0000 mg | ORAL_TABLET | Freq: Two times a day (BID) | ORAL | Status: DC
  Administered 2022-12-16 – 2022-12-20 (×9): 500 mg via ORAL
  Filled 2022-12-16: qty 1
  Filled 2022-12-16: qty 14
  Filled 2022-12-16 (×10): qty 1
  Filled 2022-12-16: qty 14
  Filled 2022-12-16: qty 1

## 2022-12-16 MED ORDER — TRAZODONE HCL 100 MG PO TABS
100.0000 mg | ORAL_TABLET | Freq: Every evening | ORAL | Status: DC | PRN
  Administered 2022-12-16 – 2022-12-17 (×3): 100 mg via ORAL
  Filled 2022-12-16 (×3): qty 1

## 2022-12-16 MED ORDER — LORAZEPAM 1 MG PO TABS
1.0000 mg | ORAL_TABLET | Freq: Three times a day (TID) | ORAL | Status: AC
  Administered 2022-12-16 – 2022-12-17 (×5): 1 mg via ORAL
  Filled 2022-12-16 (×5): qty 1

## 2022-12-16 MED ORDER — LOPERAMIDE HCL 2 MG PO CAPS: 2.0000 mg | ORAL_CAPSULE | ORAL | Status: AC | PRN

## 2022-12-16 MED ORDER — HYDROXYZINE HCL 25 MG PO TABS
25.0000 mg | ORAL_TABLET | Freq: Four times a day (QID) | ORAL | Status: AC | PRN
  Administered 2022-12-17: 25 mg via ORAL
  Filled 2022-12-16: qty 1

## 2022-12-16 MED ORDER — DULOXETINE HCL 60 MG PO CPEP
60.0000 mg | ORAL_CAPSULE | Freq: Every day | ORAL | Status: DC
  Administered 2022-12-16 – 2022-12-20 (×5): 60 mg via ORAL
  Filled 2022-12-16 (×4): qty 1
  Filled 2022-12-16: qty 7
  Filled 2022-12-16 (×2): qty 1

## 2022-12-16 MED ORDER — ARIPIPRAZOLE 2 MG PO TABS
2.0000 mg | ORAL_TABLET | Freq: Once | ORAL | Status: AC
  Administered 2022-12-16: 2 mg via ORAL
  Filled 2022-12-16: qty 1

## 2022-12-16 MED ORDER — LORAZEPAM 1 MG PO TABS
1.0000 mg | ORAL_TABLET | Freq: Two times a day (BID) | ORAL | Status: AC
  Administered 2022-12-18 – 2022-12-19 (×4): 1 mg via ORAL
  Filled 2022-12-16 (×4): qty 1

## 2022-12-16 MED ORDER — ONDANSETRON 4 MG PO TBDP
4.0000 mg | ORAL_TABLET | Freq: Four times a day (QID) | ORAL | Status: AC | PRN
  Administered 2022-12-16 (×2): 4 mg via ORAL
  Filled 2022-12-16 (×2): qty 1

## 2022-12-16 MED ORDER — BUSPIRONE HCL 10 MG PO TABS
10.0000 mg | ORAL_TABLET | Freq: Two times a day (BID) | ORAL | Status: DC
  Administered 2022-12-16 – 2022-12-20 (×8): 10 mg via ORAL
  Filled 2022-12-16 (×3): qty 1
  Filled 2022-12-16: qty 14
  Filled 2022-12-16 (×2): qty 1
  Filled 2022-12-16: qty 14
  Filled 2022-12-16 (×5): qty 1

## 2022-12-16 MED ORDER — LORAZEPAM 1 MG PO TABS: 1.0000 mg | ORAL_TABLET | Freq: Four times a day (QID) | ORAL | Status: DC | PRN

## 2022-12-16 MED ORDER — GABAPENTIN 400 MG PO CAPS
400.0000 mg | ORAL_CAPSULE | Freq: Three times a day (TID) | ORAL | Status: DC
  Administered 2022-12-16 – 2022-12-20 (×13): 400 mg via ORAL
  Filled 2022-12-16 (×5): qty 1
  Filled 2022-12-16: qty 21
  Filled 2022-12-16 (×3): qty 1
  Filled 2022-12-16: qty 21
  Filled 2022-12-16 (×2): qty 1
  Filled 2022-12-16: qty 21
  Filled 2022-12-16 (×6): qty 1

## 2022-12-16 MED ORDER — THIAMINE HCL 100 MG/ML IJ SOLN
100.0000 mg | Freq: Once | INTRAMUSCULAR | Status: AC
  Administered 2022-12-16: 100 mg via INTRAMUSCULAR
  Filled 2022-12-16: qty 2

## 2022-12-16 MED ORDER — VITAMIN B-1 100 MG PO TABS
100.0000 mg | ORAL_TABLET | Freq: Every day | ORAL | Status: DC
  Administered 2022-12-17 – 2022-12-20 (×4): 100 mg via ORAL
  Filled 2022-12-16 (×7): qty 1

## 2022-12-16 MED ORDER — ADULT MULTIVITAMIN W/MINERALS CH
1.0000 | ORAL_TABLET | Freq: Every day | ORAL | Status: DC
  Administered 2022-12-16 – 2022-12-20 (×5): 1 via ORAL
  Filled 2022-12-16 (×8): qty 1

## 2022-12-16 NOTE — Tx Team (Signed)
Initial Treatment Plan 12/16/2022 1:35 AM [name] [id number]    PATIENT STRESSORS: Financial difficulties   Occupational concerns   Substance abuse     PATIENT STRENGTHS: Active sense of humor  Motivation for treatment/growth  Supportive family/friends    PATIENT IDENTIFIED PROBLEMS: Substance abuse Depression  "How to get energy back without using substance"  "To have a routine"                 DISCHARGE CRITERIA:  Ability to meet basic life and health needs Improved stabilization in mood, thinking, and/or behavior Medical problems require only outpatient monitoring Motivation to continue treatment in a less acute level of care Verbal commitment to aftercare and medication compliance  PRELIMINARY DISCHARGE PLAN: Attend aftercare/continuing care group Attend PHP/IOP Outpatient therapy Return to previous living arrangement  PATIENT/FAMILY INVOLVEMENT: This treatment plan has been presented to and reviewed with the patient, John Vaughan, and/or family member.  The patient and family have been given the opportunity to ask questions and make suggestions.  Bethann Punches, RN 12/16/2022, 1:35 AM

## 2022-12-16 NOTE — H&P (Signed)
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   ETH 281 (H) 12/15/2022   ETH 346 (HH) 09/19/2022    Metabolic Disorder Labs:  Lab Results  Component Value Date   HGBA1C 5.4 05/14/2022   MPG 108.28 05/14/2022   MPG 102.54 10/25/2021   No results found for: "PROLACTIN" Lab Results  Component Value Date   CHOL 196 05/14/2022    TRIG 93 05/14/2022   HDL 57 05/14/2022   CHOLHDL 3.4 05/14/2022   VLDL 19 05/14/2022   LDLCALC 120 (H) 05/14/2022   LDLCALC 93 10/25/2021    Current Medications: Current Facility-Administered Medications  Medication Dose Route Frequency Provider Last Rate Last Admin   acetaminophen (TYLENOL) tablet 650 mg  650 mg Oral Q6H PRN Leevy-Johnson, Brooke A, NP       alum & mag hydroxide-simeth (MAALOX/MYLANTA) 200-200-20 MG/5ML suspension 30 mL  30 mL Oral Q4H PRN Leevy-Johnson, Brooke A, NP       gabapentin (NEURONTIN) capsule 400 mg  400 mg Oral TID Sarita Bottom, MD       hydrOXYzine (ATARAX) tablet 25 mg  25 mg Oral Q6H PRN Jandi Swiger E, PA       levETIRAcetam (KEPPRA) tablet 500 mg  500 mg Oral BID Abbott Pao, Nadir, MD   500 mg at 12/16/22 0947   loperamide (IMODIUM) capsule 2-4 mg  2-4 mg Oral PRN Jmya Uliano E, PA       LORazepam (ATIVAN) tablet 1 mg  1 mg Oral TID Rickard Kennerly E, PA   1 mg at 12/16/22 6045   Followed by   Melene Muller ON 12/18/2022] LORazepam (ATIVAN) tablet 1 mg  1 mg Oral BID Shemuel Harkleroad E, PA       Followed by   Melene Muller ON 12/20/2022] LORazepam (ATIVAN) tablet 0.5 mg  0.5 mg Oral Daily Azie Mcconahy E, PA       magnesium hydroxide (MILK OF MAGNESIA) suspension 30 mL  30 mL Oral Daily PRN Leevy-Johnson, Brooke A, NP       multivitamin with minerals tablet 1 tablet  1 tablet Oral Daily Amandamarie Feggins E, PA   1 tablet at 12/16/22 0938   ondansetron (ZOFRAN-ODT) disintegrating tablet 4 mg  4 mg Oral Q6H PRN Lekita Kerekes, Tommas Olp, PA       [START ON 12/17/2022] thiamine (Vitamin B-1) tablet 100 mg  100 mg Oral Daily Ieisha Gao E, PA       traZODone (DESYREL) tablet 100 mg  100 mg Oral QHS PRN Ajibola, Ene A, NP   100 mg at 12/16/22 0023   PTA Medications: Medications Prior to Admission  Medication Sig Dispense Refill Last Dose   hydrOXYzine (ATARAX) 50 MG tablet Take 50 mg by mouth 3 (three) times daily as needed for anxiety.      busPIRone (BUSPAR) 5 MG tablet  Take 1 tablet (5 mg total) by mouth 2 (two) times daily. 60 tablet 0    cyanocobalamin 1000 MCG tablet Take 1 tablet (1,000 mcg total) by mouth daily. 30 tablet 0    DULoxetine (CYMBALTA) 60 MG capsule Take 1 capsule (60 mg total) by mouth daily. 30 capsule 0    gabapentin (NEURONTIN) 400 MG capsule Take 1 capsule (400 mg total) by mouth 3 (three) times daily. 90 capsule 0    levETIRAcetam (KEPPRA) 500 MG tablet Take 1 tablet (500 mg total) by mouth  2 (two) times daily. 60 tablet 0    mirtazapine (REMERON) 7.5 MG tablet Take 1 tablet (7.5 mg total) by mouth at bedtime. 30 tablet 0    pantoprazole (PROTONIX) 40 MG tablet Take 1 tablet (40 mg total) by mouth daily. 30 tablet 0    traZODone (DESYREL) 100 MG tablet Take 1 tablet (100 mg total) by mouth at bedtime as needed for sleep. 30 tablet 0     Musculoskeletal: Strength & Muscle Tone: within normal limits Gait & Station: normal Patient leans: N/A            Psychiatric Specialty Exam:  Presentation  General Appearance:  Appropriate for Environment; Fairly Groomed  Eye Contact: Good  Speech: Clear and Coherent; Normal Rate  Speech Volume: Normal  Handedness: Right   Mood and Affect  Mood: Depressed; Anxious  Affect: Congruent   Thought Process  Thought Processes: Coherent; Goal Directed  Duration of Psychotic Symptoms:N/A Past Diagnosis of Schizophrenia or Psychoactive disorder: No  Descriptions of Associations:Intact  Orientation:Full (Time, Place and Person)  Thought Content:Logical  Hallucinations:Hallucinations: None  Ideas of Reference:Paranoia  Suicidal Thoughts:Suicidal Thoughts: Yes, Passive SI Passive Intent and/or Plan: Without Intent; Without Plan  Homicidal Thoughts:Homicidal Thoughts: No   Sensorium  Memory: Immediate Good; Remote Fair; Recent Fair  Judgment: Fair  Insight: Fair   Art therapist  Concentration: Good  Attention  Span: Good  Recall: Good  Fund of Knowledge: Good  Language: Good   Psychomotor Activity  Psychomotor Activity: Psychomotor Activity: Normal   Assets  Assets: Communication Skills; Desire for Improvement; Housing; Physical Health; Social Support   Sleep  Sleep: Sleep: Poor    Physical Exam: Physical Exam Constitutional:      Appearance: Normal appearance.  HENT:     Head: Normocephalic and atraumatic.     Nose: Nose normal.     Mouth/Throat:     Mouth: Mucous membranes are moist.  Eyes:     Extraocular Movements: Extraocular movements intact.     Pupils: Pupils are equal, round, and reactive to light.  Cardiovascular:     Rate and Rhythm: Normal rate and regular rhythm.  Pulmonary:     Effort: Pulmonary effort is normal.     Breath sounds: Normal breath sounds.  Abdominal:     General: Abdomen is flat.  Musculoskeletal:        General: Normal range of motion.     Cervical back: Normal range of motion.  Skin:    General: Skin is warm and dry.  Neurological:     General: No focal deficit present.     Mental Status: He is alert and oriented to person, place, and time.  Psychiatric:        Attention and Perception: Attention and perception normal. He does not perceive auditory or visual hallucinations.        Mood and Affect: Mood is anxious and depressed. Affect is blunt.        Speech: Speech normal.        Behavior: Behavior normal. Behavior is cooperative.        Thought Content: Thought content is paranoid. Thought content includes suicidal ideation. Thought content does not include homicidal ideation. Thought content does not include suicidal plan.        Cognition and Memory: Cognition and memory normal.        Judgment: Judgment is impulsive.    Review of Systems  Constitutional:  Positive for diaphoresis.  HENT: Negative.    Eyes:  Negative.   Respiratory: Negative.    Cardiovascular: Negative.   Gastrointestinal:  Positive for diarrhea and  nausea.  Skin: Negative.   Neurological: Negative.   Psychiatric/Behavioral:  Positive for depression, substance abuse and suicidal ideas. Negative for hallucinations. The patient is nervous/anxious and has insomnia.    Blood pressure (!) 139/95, pulse 96, temperature 98.4 F (36.9 C), temperature source Oral, resp. rate 18, height 6\' 1"  (1.854 m), weight 92.5 kg, SpO2 100 %. Body mass index is 26.91 kg/m.  Treatment Plan Summary:  Daily contact with patient to assess and evaluate symptoms and progress in treatment and Medication management  Observation Level/Precautions:  15 minute checks  Laboratory: Labs to be independently reviewed on 12/16/2022  Psychotherapy:  Unit group sessions  Medications:  See [facility]  Consultations:  To be determined  Discharge Concerns: Safety, medication compliance, mood stability  Estimated LOS: 5 - 7 days  Other:  N/A   Physician Treatment Plan for Primary Diagnosis: MDD (major depressive disorder), recurrent episode, severe (HCC) Long Term Goal(s): Improvement in symptoms so as ready for discharge  Short Term Goals: Ability to identify changes in lifestyle to reduce recurrence of condition will improve, Ability to verbalize feelings will improve, Ability to disclose and discuss suicidal ideas, Ability to demonstrate self-control will improve, Ability to identify and develop effective coping behaviors will improve, Ability to maintain clinical measurements within normal limits will improve, Compliance with prescribed medications will improve, and Ability to identify triggers associated with substance abuse/mental health issues will improve  Physician Treatment Plan for Secondary Diagnosis: Principal Problem:   MDD (major depressive disorder), recurrent episode, severe (HCC) Active Problems:   GAD (generalized anxiety disorder)   Alcohol use disorder, severe, dependence (HCC)   Tobacco abuse   Cocaine abuse (HCC)  Long Term Goal(s): Improvement in  symptoms so as ready for discharge  Short Term Goals: Ability to identify changes in lifestyle to reduce recurrence of condition will improve, Ability to verbalize feelings will improve, Ability to disclose and discuss suicidal ideas, Ability to demonstrate self-control will improve, Ability to identify and develop effective coping behaviors will improve, Ability to maintain clinical measurements within normal limits will improve, Compliance with prescribed medications will improve, and Ability to identify triggers associated with substance abuse/mental health issues will improve  Plan:  Patient was admitted to [facility] due to suicidal ideations in the presence of substance abuse and worsening mood.  Patient endorses the use of psychiatric medications but states that he last uses medications on Wednesday.  Patient believes that his medications were helpful when he was taking them consistently.  Patient continues to endorse worsening depression and anxiety.  Patient also endorses suicidal ideations but is able to contract for safety.  Patient to be placed back on Cymbalta 60 mg daily for the management of his depression and anxiety.  Patient to be placed on Abilify 2 mg today, followed by 5 mg daily as an adjunct to the management of the patient's depression.  Lastly, patient to be placed on buspirone 10 mg 2 times daily for the management of his anxiety.  Safety and Monitoring: Voluntary admission to inpatient psychiatric unit for safety, stabilization and treatment Daily contact with patient to assess and evaluate symptoms and progress in treatment Patient's case to be discussed in multi-disciplinary team meeting Observation Level : q15 minute checks Vital signs: q12 hours Precautions: safety  Diagnosis:  Principal Problem:   MDD (major depressive disorder), recurrent episode, severe (HCC) Active Problems:   GAD (generalized  anxiety disorder)   Alcohol use disorder, severe, dependence (HCC)    Tobacco abuse   Cocaine abuse (HCC)  #Major depressive disorder, recurrent episode, severe, without psychotic features -Restart Cymbalta 60 mg daily for depression -Start Abilify 2 mg today followed by 5 mg daily for mood stability and depression  #Generalized anxiety disorder -Restart Cymbalta 60 mg daily for anxiety -Restart gabapentin 400 mg 3 times daily for anxiety  #Insomnia -Continue trazodone 100 mg at bedtime for sleep  #Seizure disorder by history -Restart Keppra 500 mg 2 times daily  #Alcohol use disorder, severe, dependence Initiate lorazepam taper  Lorazepam 1 mg 3 times daily for 2 days  Lorazepam 1 mg 2 times daily for 2 days Lorazepam 0.5 mg 2 times daily for 2 days  CIWA protocol initiated -[facility] withdrawal assessment while awake x 48 hours, then BID x 48 hours, then daily x 48 hours then discontinue -Hydroxyzine 25 mg every 6 hours as needed for anxiety/agitation or CIWA < or = 10 -Loperamide 2 to 4 mg as needed for diarrhea or loose stools -Multivitamin 1 tablet daily -Ondansetron 4 mg every 6 hours as needed for nausea/vomiting -Thiamine 100 mg daily -Thiamine 100 mg intramuscular injection once  As needed medications: -Patient to continue taking Tylenol 650 mg every 6 hours as needed for mild pain -Patient to continue taking Maalox/Mylanta 30 mL every 4 hours as needed for indigestion -Patient to continue taking Milk of Magnesia 30 mL as needed for mild constipation  Discharge Planning: Social work and case management to assist with discharge planning and identification of hospital follow-up needs prior to discharge Estimated LOS: 5-7 days Discharge Concerns: Need to establish a safety plan; Medication compliance and effectiveness Discharge Goals: Return home with outpatient referrals for mental health follow-up including medication management/psychotherapy   I certify that inpatient services furnished can reasonably be expected to improve  the patient's condition.    Meta Hatchet, PA 12/24/202311:56 AM

## 2022-12-16 NOTE — Group Note (Unsigned)
Date:  12/16/2022 Time:  2:09 PM  Group Topic/Focus:  Goals Group:   The focus of this group is to help patients establish daily goals to achieve during treatment and discuss how the patient can incorporate goal setting into their daily lives to aide in recovery. Orientation:   The focus of this group is to educate the patient on the purpose and policies of crisis stabilization and provide a format to answer questions about their admission.  The group details unit policies and expectations of patients while admitted.     Participation Level:  {B[facility] PARTICIPATION LEVEL:22264}  Participation Quality:  {BHH PARTICIPATION QUALITY:22265}  Affect:  {B[facility] AFFECT:22266}  Cognitive:  {B[facility] COGNITIVE:22267}  Insight: {BHH Insight2:20797}  Engagement in Group:  {B[facility] ENGAGEMENT IN GROUP:22268}  Modes of Intervention:  {BHH MODES OF INTERVENTION:22269}  Additional Comments:  ***  Ira Dougher Lashawn Laraina Sulton 12/16/2022, 2:09 PM  
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
## 2022-12-16 NOTE — [facility] Suicide Risk Assessment (Signed)
[facility] INPATIENT:  Family/Significant Other Suicide Prevention Education  Suicide Prevention Education:  Patient Refusal for Family/Significant Other Suicide Prevention Education: The patient John Vaughan has refused to provide written consent for family/significant other to be provided Family/Significant Other Suicide Prevention Education during admission and/or prior to discharge.  Physician notified.  Carloyn Jaeger Grossman-Orr 12/16/2022, 3:26 PM
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
## 2022-12-16 NOTE — Progress Notes (Signed)
Adult Psychoeducational Group Note  Date:  12/16/2022 Time:  9:33 PM  Group Topic/Focus:  Wrap-Up Group:   The focus of this group is to help patients review their daily goal of treatment and discuss progress on daily workbooks.  Participation Level:  Active  Participation Quality:  Appropriate and Sharing  Affect:  Appropriate  Cognitive:  Appropriate  Insight: Appropriate  Engagement in Group:  Engaged  Modes of Intervention:  Discussion, Rapport Building, and Support  Additional Comments:   Pt attended and participated in the [place] group. Pt denied SI/HI/AVH. Pt rated his day 7/10. Pt met his goal today, successfully making contact with his girlfriend via telephone. Pt identified eating, sleeping, and television as effective coping skills.  John Vaughan 12/16/2022, 9:33 PM

## 2022-12-16 NOTE — Progress Notes (Signed)
   12/16/22 1230  Psych Admission Type (Psych Patients Only)  Admission Status Involuntary  Psychosocial Assessment  Patient Complaints Anxiety  Eye Contact Fair  Facial Expression Anxious  Affect Anxious;Depressed  Speech Logical/coherent  Interaction Assertive  Motor Activity Slow  Appearance/Hygiene Improved  Behavior Characteristics Cooperative  Mood Depressed;Anxious  Thought Process  Coherency WDL  Content Blaming self  Delusions None reported or observed  Perception WDL  Hallucination None reported or observed  Judgment Poor  Confusion None  Danger to Self  Current suicidal ideation? Denies  Danger to Others  Danger to Others None reported or observed

## 2022-12-16 NOTE — Group Note (Signed)
[facility] LCSW Group Therapy Note  Date/Time:  12/16/2022   Type of Therapy and Topic:  Group could not be held today.  A handout to read on the topic of developing a healthy support system was given to each patient.  They were encouraged to discuss this with their peers.  Handout:  A handout entitled "Developing Your Support System" was provided.  This handout discusses the benefits of a social support system, how to sustain current relationships, some ideas for building a social support system, and why it is important to cultivate your social support system now.  Therapeutic Modalities:   Handout  Jordynn Perrier Grossman-Orr, LCSW        

## 2022-12-16 NOTE — Group Note (Unsigned)
Date:  12/16/2022 Time:  2:03 PM  Group Topic/Focus:  Goals Group:   The focus of this group is to help patients establish daily goals to achieve during treatment and discuss how the patient can incorporate goal setting into their daily lives to aide in recovery. Orientation:   The focus of this group is to educate the patient on the purpose and policies of crisis stabilization and provide a format to answer questions about their admission.  The group details unit policies and expectations of patients while admitted.     Participation Level:  {[facility] PARTICIPATION LEVEL:22264}  Participation Quality:  {[facility] PARTICIPATION QUALITY:22265}  Affect:  {[facility] AFFECT:22266}  Cognitive:  {[facility] COGNITIVE:22267}  Insight: {[facility] Insight2:20797}  Engagement in Group:  {[facility] ENGAGEMENT IN GROUP:22268}  Modes of Intervention:  {[facility] MODES OF INTERVENTION:22269}  Additional Comments:  ***  John Vaughan John Vaughan John Vaughan 12/16/2022, 2:03 PM  

## 2022-12-16 NOTE — Progress Notes (Signed)
John Vaughan is a 31 y.o. male involuntarily admitted for  substance abuse. Pt stated he has been drinking and using crack cocaine daily for the past 3 weeks. Pt stated he had been clean for sometime, then he got some money which triggered him to drink. Pt reports history epilepsy and alcohol withdrawal seizures. Pt stated last episode was two days ago. Pt has been alert and cooperative with the admission process, denied SI/HI at the time of admission. Consents signed, skin/belongings search completed and pt oriented to unit. Pt stable at this time. Pt given the opportunity to express concerns and ask questions. Pt given toiletries. Will continue to monitor.

## 2022-12-16 NOTE — Group Note (Signed)
Date:  12/16/2022 Time:  2:06 PM  Group Topic/Focus:  Goals Group:   The focus of this group is to help patients establish daily goals to achieve during treatment and discuss how the patient can incorporate goal setting into their daily lives to aide in recovery.    Participation Level:  Active  Participation Quality:  Appropriate  Affect:  Appropriate  Cognitive:  Appropriate  Insight: Appropriate  Engagement in Group:  Engaged  Modes of Intervention:  Discussion  Additional Comments:  Pt wants to do better and talk to Dr  Jaquita Rector 12/16/2022, 2:06 PM

## 2022-12-16 NOTE — Group Note (Signed)
Date:  12/16/2022 Time:  6:07 PM  Group Topic/Focus:  Dimensions of Wellness:   The focus of this group is to introduce the topic of wellness and discuss the role each dimension of wellness plays in total health.    Participation Level:  Active  Participation Quality:  Appropriate  Affect:  Appropriate  Cognitive:  Appropriate  Insight: Appropriate  Engagement in Group:  Engaged  Modes of Intervention:  Exploration  Additional Comments:     Reymundo Poll 12/16/2022, 6:07 PM

## 2022-12-17 ENCOUNTER — Encounter (HOSPITAL_COMMUNITY): Payer: Self-pay

## 2022-12-17 DIAGNOSIS — F332 Major depressive disorder, recurrent severe without psychotic features: Secondary | ICD-10-CM | POA: Diagnosis not present

## 2022-12-17 NOTE — Progress Notes (Signed)
   12/17/22 0900  Psych Admission Type (Psych Patients Only)  Admission Status Involuntary  Psychosocial Assessment  Patient Complaints Anxiety  Eye Contact Fair  Facial Expression Animated  Affect Appropriate to circumstance  Speech Logical/coherent  Interaction Assertive  Motor Activity Slow  Appearance/Hygiene Unremarkable  Behavior Characteristics Cooperative;Appropriate to situation  Mood Depressed;Anxious;Pleasant  Aggressive Behavior  Effect No apparent injury  Thought Process  Coherency WDL  Content WDL  Delusions None reported or observed  Perception WDL  Hallucination None reported or observed  Judgment Poor  Confusion None  Danger to Self  Current suicidal ideation? Denies  Agreement Not to Harm Self Yes  Description of Agreement Verbal  Danger to Others  Danger to Others None reported or observed

## 2022-12-17 NOTE — BH IP Treatment Plan (Signed)
Interdisciplinary Treatment and Diagnostic Plan Update  12/17/2022 Time of Session: 9:25 AM  John Vaughan MRN: [id number]  Principal Diagnosis: MDD (major depressive disorder), recurrent episode, severe (HCC)  Secondary Diagnoses: Principal Problem:   MDD (major depressive disorder), recurrent episode, severe (HCC) Active Problems:   GAD (generalized anxiety disorder)   Alcohol use disorder, severe, dependence (HCC)   Tobacco abuse   Cocaine abuse (HCC)   Current Medications:  Current Facility-Administered Medications  Medication Dose Route Frequency Provider Last Rate Last Admin   acetaminophen (TYLENOL) tablet 650 mg  650 mg Oral Q6H PRN Leevy-Johnson, Brooke A, NP       alum & mag hydroxide-simeth (MAALOX/MYLANTA) 200-200-20 MG/5ML suspension 30 mL  30 mL Oral Q4H PRN Leevy-Johnson, Brooke A, NP       ARIPiprazole (ABILIFY) tablet 5 mg  5 mg Oral Daily Nwoko, Uchenna E, PA   5 mg at 12/17/22 0806   busPIRone (BUSPAR) tablet 10 mg  10 mg Oral BID Nwoko, Uchenna E, PA   10 mg at 12/17/22 0807   DULoxetine (CYMBALTA) DR capsule 60 mg  60 mg Oral Daily Nwoko, Uchenna E, PA   60 mg at 12/17/22 5809   gabapentin (NEURONTIN) capsule 400 mg  400 mg Oral TID Sarita Bottom, MD   400 mg at 12/17/22 9833   hydrOXYzine (ATARAX) tablet 25 mg  25 mg Oral Q6H PRN Nwoko, Uchenna E, PA       levETIRAcetam (KEPPRA) tablet 500 mg  500 mg Oral BID Abbott Pao, Nadir, MD   500 mg at 12/17/22 8250   loperamide (IMODIUM) capsule 2-4 mg  2-4 mg Oral PRN Nwoko, Uchenna E, PA       LORazepam (ATIVAN) tablet 1 mg  1 mg Oral TID Nwoko, Uchenna E, PA   1 mg at 12/17/22 5397   Followed by   Melene Muller ON 12/18/2022] LORazepam (ATIVAN) tablet 1 mg  1 mg Oral BID Nwoko, Uchenna E, PA       Followed by   Melene Muller ON 12/20/2022] LORazepam (ATIVAN) tablet 0.5 mg  0.5 mg Oral Daily Nwoko, Uchenna E, PA       magnesium hydroxide (MILK OF MAGNESIA) suspension 30 mL  30 mL Oral Daily PRN Leevy-Johnson, Brooke A, NP        multivitamin with minerals tablet 1 tablet  1 tablet Oral Daily Nwoko, Uchenna E, PA   1 tablet at 12/17/22 0807   ondansetron (ZOFRAN-ODT) disintegrating tablet 4 mg  4 mg Oral Q6H PRN Nwoko, Uchenna E, PA   4 mg at 12/16/22 1531   thiamine (Vitamin B-1) tablet 100 mg  100 mg Oral Daily Nwoko, Uchenna E, PA   100 mg at 12/17/22 6734   traZODone (DESYREL) tablet 100 mg  100 mg Oral QHS PRN Ajibola, Ene A, NP   100 mg at 12/16/22 2150   PTA Medications: Medications Prior to Admission  Medication Sig Dispense Refill Last Dose   hydrOXYzine (ATARAX) 50 MG tablet Take 50 mg by mouth 3 (three) times daily as needed for anxiety.      busPIRone (BUSPAR) 5 MG tablet Take 1 tablet (5 mg total) by mouth 2 (two) times daily. 60 tablet 0    cyanocobalamin 1000 MCG tablet Take 1 tablet (1,000 mcg total) by mouth daily. 30 tablet 0    DULoxetine (CYMBALTA) 60 MG capsule Take 1 capsule (60 mg total) by mouth daily. 30 capsule 0    gabapentin (NEURONTIN) 400 MG capsule Take 1 capsule (400  mg total) by mouth 3 (three) times daily. 90 capsule 0    levETIRAcetam (KEPPRA) 500 MG tablet Take 1 tablet (500 mg total) by mouth 2 (two) times daily. 60 tablet 0    mirtazapine (REMERON) 7.5 MG tablet Take 1 tablet (7.5 mg total) by mouth at bedtime. 30 tablet 0    pantoprazole (PROTONIX) 40 MG tablet Take 1 tablet (40 mg total) by mouth daily. 30 tablet 0    traZODone (DESYREL) 100 MG tablet Take 1 tablet (100 mg total) by mouth at bedtime as needed for sleep. 30 tablet 0     Patient Stressors: Financial difficulties   Occupational concerns   Substance abuse    Patient Strengths: Active sense of humor  Motivation for treatment/growth  Supportive family/friends   Treatment Modalities: Medication Management, Group therapy, Case management,  1 to 1 session with clinician, Psychoeducation, Recreational therapy.   Physician Treatment Plan for Primary Diagnosis: MDD (major depressive disorder), recurrent episode,  severe (HCC) Long Term Goal(s): Improvement in symptoms so as ready for discharge   Short Term Goals: Ability to identify changes in lifestyle to reduce recurrence of condition will improve Ability to verbalize feelings will improve Ability to disclose and discuss suicidal ideas Ability to demonstrate self-control will improve Ability to identify and develop effective coping behaviors will improve Ability to maintain clinical measurements within normal limits will improve Compliance with prescribed medications will improve Ability to identify triggers associated with substance abuse/mental health issues will improve  Medication Management: Evaluate patient's response, side effects, and tolerance of medication regimen.  Therapeutic Interventions: 1 to 1 sessions, Unit Group sessions and Medication administration.  Evaluation of Outcomes: Not Progressing  Physician Treatment Plan for Secondary Diagnosis: Principal Problem:   MDD (major depressive disorder), recurrent episode, severe (HCC) Active Problems:   GAD (generalized anxiety disorder)   Alcohol use disorder, severe, dependence (HCC)   Tobacco abuse   Cocaine abuse (HCC)  Long Term Goal(s): Improvement in symptoms so as ready for discharge   Short Term Goals: Ability to identify changes in lifestyle to reduce recurrence of condition will improve Ability to verbalize feelings will improve Ability to disclose and discuss suicidal ideas Ability to demonstrate self-control will improve Ability to identify and develop effective coping behaviors will improve Ability to maintain clinical measurements within normal limits will improve Compliance with prescribed medications will improve Ability to identify triggers associated with substance abuse/mental health issues will improve     Medication Management: Evaluate patient's response, side effects, and tolerance of medication regimen.  Therapeutic Interventions: 1 to 1 sessions,  Unit Group sessions and Medication administration.  Evaluation of Outcomes: Not Progressing   RN Treatment Plan for Primary Diagnosis: MDD (major depressive disorder), recurrent episode, severe (HCC) Long Term Goal(s): Knowledge of disease and therapeutic regimen to maintain health will improve  Short Term Goals: Ability to remain free from injury will improve, Ability to verbalize frustration and anger appropriately will improve, Ability to demonstrate self-control, Ability to participate in decision making will improve, Ability to verbalize feelings will improve, Ability to disclose and discuss suicidal ideas, Ability to identify and develop effective coping behaviors will improve, and Compliance with prescribed medications will improve  Medication Management: RN will administer medications as ordered by provider, will assess and evaluate patient's response and provide education to patient for prescribed medication. RN will report any adverse and/or side effects to prescribing provider.  Therapeutic Interventions: 1 on 1 counseling sessions, Psychoeducation, Medication administration, Evaluate responses to treatment, Monitor vital  signs and CBGs as ordered, Perform/monitor CIWA, COWS, AIMS and Fall Risk screenings as ordered, Perform wound care treatments as ordered.  Evaluation of Outcomes: Not Progressing   LCSW Treatment Plan for Primary Diagnosis: MDD (major depressive disorder), recurrent episode, severe (HCC) Long Term Goal(s): Safe transition to appropriate next level of care at discharge, Engage patient in therapeutic group addressing interpersonal concerns.  Short Term Goals: Engage patient in aftercare planning with referrals and resources, Increase social support, Increase ability to appropriately verbalize feelings, Increase emotional regulation, Facilitate acceptance of mental health diagnosis and concerns, Facilitate patient progression through stages of change regarding substance  use diagnoses and concerns, and Identify triggers associated with mental health/substance abuse issues  Therapeutic Interventions: Assess for all discharge needs, 1 to 1 time with Social worker, Explore available resources and support systems, Assess for adequacy in community support network, Educate family and significant other(s) on suicide prevention, Complete Psychosocial Assessment, Interpersonal group therapy.  Evaluation of Outcomes: Not Progressing   Progress in Treatment: Attending groups: Yes. Participating in groups: Yes. Taking medication as prescribed: Yes. Toleration medication: Yes. Family/Significant other contact made: No, will contact:  Patient declined  Patient understands diagnosis: Yes. Discussing patient identified problems/goals with staff: Yes. Medical problems stabilized or resolved: Yes. Denies suicidal/homicidal ideation: Yes. Issues/concerns per patient self-inventory: No.   New problem(s) identified: No, Describe:  None reported   New Short Term/Long Term Goal(s):medication stabilization, elimination of SI thoughts, development of comprehensive mental wellness plan.    Patient Goals:  "get back on medication and work on my depression"  Discharge Plan or Barriers: Patient recently admitted. CSW will continue to follow and assess for appropriate referrals and possible discharge planning.    Reason for Continuation of Hospitalization: Anxiety Depression Medical Issues Medication stabilization Suicidal ideation Withdrawal symptoms  Estimated Length of Stay:3-5 days   Last 3 [place] Suicide Severity Risk Score: Flowsheet Row [facility]Current[facility] Most recent reading at 12/16/2022 12:53 AM ED from 12/15/2022 in [facility] Most recent reading at 12/15/2022  1:58 AM Admission (Discharged) [facility] Most  recent reading at 09/19/2022  4:00 PM  C-SSRS RISK CATEGORY No Risk High Risk High Risk       Last PHQ 2/9 Scores:    06/11/2022    9:33 PM 05/29/2022    2:01 PM 04/12/2021    2:51 PM  Depression screen PHQ 2/9  Decreased Interest 2 1 2   Down, Depressed, Hopeless 3 2 3   PHQ - 2 Score 5 3 5   Altered sleeping 3 1 3   Tired, decreased energy 2 1 2   Change in appetite 2 1 2   Feeling bad or failure about yourself  2 2 3   Trouble concentrating 1 2 3   Moving slowly or fidgety/restless 1 0 3  Suicidal thoughts 1 1 0  PHQ-9 Score 17 11 21   Difficult doing work/chores Very difficult Somewhat difficult     Scribe for Treatment Team: 12/17/2022 10:55 AM

## 2022-12-17 NOTE — Group Note (Signed)
Recreation Therapy Group Note   Group Topic:Problem Solving  Group Date: 12/17/2022 Start Time: 0930 End Time: 1000 Facilitators: Danyale Ridinger-McCall, LRT,CTRS Location: [address] Dayroom   Goal Area(s) Addresses:  Patient will effectively work in a team with other group members. Patient will verbalize importance of using appropriate problem solving techniques.  Patient will identify positive change associated with effective problem solving skills.    Group Description: Christmas Trivia.  Patients were divided into two teams.  LRT conducted three rounds of trivia with the patients.  The value of each question, was written beside each line were the answer was to be written.  After each round, patients will count of their points and write them at the bottom of the sheet.  At the end of the game, patients will add the totals of all three rounds together.  The team with the most points, wins the game.     Affect/Mood: Appropriate   Participation Level: Engaged   Participation Quality: Independent   Behavior: Appropriate   Speech/Thought Process: Focused   Insight: Good   Judgement: Good   Modes of Intervention: Competitive Play   Patient Response to Interventions:  Engaged   Education Outcome:  Acknowledges education and In group clarification offered    Clinical Observations/Individualized Feedback: Pt was social and social with peers.  Pt was appropriate and worked well with his teammates.  Pt was pleasant and engaged throughout.      Plan: Continue to engage patient in RT group sessions 2-3x/week.   Horacio Werth-McCall, LRT,CTRS  12/17/2022 12:14 PM

## 2022-12-17 NOTE — Progress Notes (Signed)
   12/16/22 2000  Psych Admission Type (Psych Patients Only)  Admission Status Involuntary  Psychosocial Assessment  Patient Complaints None  Eye Contact Fair  Facial Expression Animated  Affect Appropriate to circumstance  Speech Logical/coherent  Interaction Assertive  Motor Activity Slow  Appearance/Hygiene Unremarkable  Behavior Characteristics Cooperative;Appropriate to situation  Mood Pleasant  Thought Process  Coherency WDL  Content WDL  Delusions None reported or observed  Perception WDL  Hallucination None reported or observed  Judgment Poor  Confusion None  Danger to Self  Current suicidal ideation? Denies  Agreement Not to Harm Self Yes  Description of Agreement verbal  Danger to Others  Danger to Others None reported or observed

## 2022-12-17 NOTE — [facility] Group Notes (Signed)
Patient attended the AA group. 

## 2022-12-17 NOTE — [facility] Group Notes (Signed)
Patient attended goals group. He shared that his goal is "staying positive".

## 2022-12-17 NOTE — Progress Notes (Signed)
[facility] MD Progress Note  12/17/2022 2:26 PM John Vaughan  MRN:  [id number]  Reason for admission:  31 year old, Caucasian male with a past psychiatric history significant for alcohol abuse, cocaine abuse, substance-induced mood disorder, generalized anxiety disorder, and major depressive disorder who was admitted to [facility] from [facility] ED due to suicidal thoughts in the presence of worsening mood and substance abuse.   Daily notes: John Vaughan is seen, chart reviewed. The chart findings discussed with the treatment team. He is lying down in bed, awake. He is making a good eye contact & verbally responsive. He says his mood is down this morning. Says he came to the hospital because he was feeling depressed because he relapsed on alcohol two weeks ago while residing in a sober living environment. He says the relapse was due to he was faced with a lot of problems & they keep piling up. He says he learnt that his mother was about to lose her home & that got to him & to cope, he relapsed on alcohol. He says his mood is still down today. Says he slept fairly last night. Says he is taking & tolerating his treatment regimen, denies any side effects. He rates his depression #8 today & anxiety #7. He currently denies any SIHI, AVH, delusional thoughts or paranoia. He does not appear to be responding to any internal stimuli. Patient is encouraged to come out of his room & attend group sessions. Reviewed current lab results, no changes. Vital sings stable. There are no changes made on the current plan of care. Will continue as already in progress.  Principal Problem: MDD (major depressive disorder), recurrent episode, severe ([place])  Diagnosis: Principal Problem:   MDD (major depressive disorder), recurrent episode, severe ([place]) Active Problems:   Alcohol use disorder, severe, dependence ([place])   GAD (generalized anxiety disorder)   Tobacco abuse   Cocaine abuse ([place])  Total  Time spent with patient: 15 minutes  Past Psychiatric History:  Alcohol use disorder Alcohol withdrawal Cocaine abuse Generalized anxiety disorder Major depressive disorder Tobacco abuse  Past Medical History:  Past Medical History:  Diagnosis Date   Alcohol abuse    Anxiety    Bipolar 2 disorder ([place])    Depression    Seizures ([place])     Past Surgical History:  Procedure Laterality Date   HAND SURGERY Right    Family History:  Family History  Problem Relation Age of Onset   Anxiety disorder Mother    Alcohol abuse Father    Anxiety disorder Maternal Aunt    Anxiety disorder Maternal Grandmother    Alcohol abuse Paternal Grandfather    Family Psychiatric  History:  Father - Alcohol abuse Mother - anxiety disorder Grandfather (paternal) - alcohol abuse Grandmother (maternal) - anxiety disorder Aunt (maternal) - anxiety disorder  Social History:  Social History   Substance and Sexual Activity  Alcohol Use Yes   Alcohol/week: 2.0 standard drinks of alcohol   Types: 2 Cans of beer per week   Comment: 48 beers a week     Social History   Substance and Sexual Activity  Drug Use Yes   Types: Cocaine    Social History   Socioeconomic History   Marital status: Single    Spouse name: Not on file   Number of children: Not on file   Years of education: Not on file   Highest education level: Not on file  Occupational History   Not on file  Tobacco Use   Smoking status: Every Day    Packs/day: 1.50    Types: Cigarettes   Smokeless tobacco: Former    Types: Snuff   Tobacco comments:    Pt states plans to stop drinking. Will deal with tobacco cessation later.  Vaping Use   Vaping Use: Never used  Substance and Sexual Activity   Alcohol use: Yes    Alcohol/week: 2.0 standard drinks of alcohol    Types: 2 Cans of beer per week    Comment: 48 beers a week   Drug use: Yes    Types: Cocaine   Sexual activity: Yes  Other Topics Concern   Not on file   Social History Narrative   Not on file   Social Determinants of Health   Financial Resource Strain: Not on file  Food Insecurity: No Food Insecurity (12/16/2022)   Hunger Vital Sign    Worried About Running Out of Food in the Last Year: Never true    Ran Out of Food in the Last Year: Never true  Transportation Needs: No Transportation Needs (12/16/2022)   PRAPARE - Administrator, Civil Service (Medical): No    Lack of Transportation (Non-Medical): No  Physical Activity: Not on file  Stress: Not on file  Social Connections: Not on file   Additional Social History:   Sleep: Good  Appetite:  Good  Current Medications: Current Facility-Administered Medications  Medication Dose Route Frequency Provider Last Rate Last Admin   acetaminophen (TYLENOL) tablet 650 mg  650 mg Oral Q6H PRN Leevy-Johnson, Brooke A, NP       alum & mag hydroxide-simeth (MAALOX/MYLANTA) 200-200-20 MG/5ML suspension 30 mL  30 mL Oral Q4H PRN Leevy-Johnson, Brooke A, NP       ARIPiprazole (ABILIFY) tablet 5 mg  5 mg Oral Daily Gaven Eugene, Uchenna E, PA   5 mg at 12/17/22 0806   busPIRone (BUSPAR) tablet 10 mg  10 mg Oral BID Armiyah Capron, Uchenna E, PA   10 mg at 12/17/22 0807   DULoxetine (CYMBALTA) DR capsule 60 mg  60 mg Oral Daily Demosthenes Virnig, Uchenna E, PA   60 mg at 12/17/22 8416   gabapentin (NEURONTIN) capsule 400 mg  400 mg Oral TID Sarita Bottom, MD   400 mg at 12/17/22 1155   hydrOXYzine (ATARAX) tablet 25 mg  25 mg Oral Q6H PRN Daril Warga, Uchenna E, PA       levETIRAcetam (KEPPRA) tablet 500 mg  500 mg Oral BID Abbott Pao, Nadir, MD   500 mg at 12/17/22 6063   loperamide (IMODIUM) capsule 2-4 mg  2-4 mg Oral PRN Yanuel Tagg, Uchenna E, PA       LORazepam (ATIVAN) tablet 1 mg  1 mg Oral TID Ensley Blas, Uchenna E, PA   1 mg at 12/17/22 1155   Followed by   Melene Muller ON 12/18/2022] LORazepam (ATIVAN) tablet 1 mg  1 mg Oral BID Rachelanne Whidby, Uchenna E, PA       Followed by   Melene Muller ON 12/20/2022] LORazepam (ATIVAN) tablet 0.5 mg  0.5  mg Oral Daily Clorene Nerio, Uchenna E, PA       magnesium hydroxide (MILK OF MAGNESIA) suspension 30 mL  30 mL Oral Daily PRN Leevy-Johnson, Brooke A, NP       multivitamin with minerals tablet 1 tablet  1 tablet Oral Daily Samanta Gal, Uchenna E, PA   1 tablet at 12/17/22 0807   ondansetron (ZOFRAN-ODT) disintegrating tablet 4 mg  4 mg Oral Q6H PRN Kentley Blyden, Tommas Olp,  PA   4 mg at 12/16/22 1531   thiamine (Vitamin B-1) tablet 100 mg  100 mg Oral Daily Karia Ehresman, Uchenna E, PA   100 mg at 12/17/22 [id number]   traZODone (DESYREL) tablet 100 mg  100 mg Oral QHS PRN Ajibola, Ene A, NP   100 mg at 12/16/22 2150   Lab Results:  No results found for this or any previous visit (from the past 48 hour(s)).  Blood Alcohol level:  Lab Results  Component Value Date   ETH 281 (H) 12/15/2022   ETH 346 (HH) [id number]   Metabolic Disorder Labs: Lab Results  Component Value Date   HGBA1C 5.4 05/14/2022   MPG 108.28 05/14/2022   MPG 102.54 10/25/2021   No results found for: "PROLACTIN" Lab Results  Component Value Date   CHOL 196 05/14/2022   TRIG 93 05/14/2022   HDL 57 05/14/2022   CHOLHDL 3.4 05/14/2022   VLDL 19 05/14/2022   LDLCALC 120 (H) 05/14/2022   LDLCALC 93 10/25/2021   Physical Findings: AIMS:  , ,  ,  ,    CIWA:  CIWA-Ar Total: 0 COWS:   n/a  Musculoskeletal: Strength & Muscle Tone: within normal limits Gait & Station: normal Patient leans: N/A  Psychiatric Specialty Exam:  Presentation  General Appearance:  Casual; Fairly Groomed  Eye Contact: Good  Speech: Clear and Coherent  Speech Volume: Normal  Handedness: Right   Mood and Affect  Mood: Anxious; Depressed  Affect: Congruent; Flat  Thought Process  Thought Processes: Coherent; Goal Directed  Descriptions of Associations:Intact  Orientation:Full (Time, Place and Person)  Thought Content:Logical  History of Schizophrenia/Schizoaffective disorder:No  Duration of Psychotic  Symptoms:N/A  Hallucinations:Hallucinations: None  Ideas of Reference:None  Suicidal Thoughts:Suicidal Thoughts: No SI Passive Intent and/or Plan: Without Intent; Without Plan; Without Means to Carry Out; Without Access to Means  Homicidal Thoughts:Homicidal Thoughts: No  Sensorium  Memory: Immediate Good; Recent Good; Remote Good  Judgment: Fair  Insight: Fair  Community education officer  Concentration: Good  Attention Span: Good  Recall: Good  Fund of Knowledge: Good  Language: Good  Psychomotor Activity  Psychomotor Activity: Psychomotor Activity: Normal  Assets  Assets: Communication Skills; Desire for Improvement; Physical Health; Resilience; Social Support  Sleep  Sleep: Sleep: Fair Number of Hours of Sleep: 5.5  Physical Exam: Physical Exam Constitutional:      Appearance: Normal appearance.  HENT:     Head: Normocephalic.     Nose: No congestion or rhinorrhea.     Mouth/Throat:     Pharynx: Oropharynx is clear.  Eyes:     Pupils: Pupils are equal, round, and reactive to light.  Cardiovascular:     Rate and Rhythm: Normal rate.     Pulses: Normal pulses.  Pulmonary:     Effort: Pulmonary effort is normal.  Genitourinary:    Comments: Deferred Musculoskeletal:        General: Normal range of motion.     Cervical back: Normal range of motion.  Skin:    General: Skin is warm and dry.  Neurological:     General: No focal deficit present.     Mental Status: He is alert and oriented to person, place, and time.  Psychiatric:        Attention and Perception: Attention and perception normal.        Mood and Affect: Mood normal.        Speech: Speech normal.        Behavior: Behavior normal. Behavior  is cooperative.        Thought Content: Thought content normal. Thought content does not include homicidal or suicidal ideation. Thought content does not include suicidal plan.        Cognition and Memory: Cognition and memory normal.         Judgment: Judgment normal.    Review of Systems  Constitutional:  Negative for chills, diaphoresis and fever.  HENT:  Negative for congestion and sore throat.   Eyes:  Negative for blurred vision.  Respiratory:  Negative for shortness of breath and wheezing.   Cardiovascular:  Negative for chest pain and palpitations.  Gastrointestinal:  Negative for abdominal pain, diarrhea, heartburn, nausea and vomiting.  Genitourinary:  Negative for dysuria.  Musculoskeletal:  Negative for back pain, joint pain and myalgias.  Skin:  Negative for itching and rash.  Neurological:  Negative for dizziness, tingling, tremors, sensory change, speech change, focal weakness, seizures (Hx of (stable on medication).), loss of consciousness, weakness and headaches.  Endo/Heme/Allergies:        NKDA  Psychiatric/Behavioral:  Positive for depression (Reports that depressive symptoms have significantly reduced. Denies SI/HI/AVH and verbally contracts for safety outside of this [facility]) and substance abuse. Negative for hallucinations, memory loss and suicidal ideas. The patient is not nervous/anxious and does not have insomnia.    Blood pressure 116/79, pulse 86, temperature 98.2 F (36.8 C), temperature source Oral, resp. rate 16, height 6\' 1"  (1.854 m), weight 92.5 kg, SpO2 100 %. Body mass index is 26.91 kg/m.  Treatment Plan Summary: Daily contact with patient to assess and evaluate symptoms and progress in treatment.  Continue inpatient hospitalization. Will continue today 12/17/2022 plan as below except where it is noted.   Plan Diagnosis:  Principal Problem:   MDD (major depressive disorder), recurrent episode, severe ([facility]) Active Problems:   GAD (generalized anxiety disorder)   Alcohol use disorder, severe, dependence ([place])   Tobacco abuse   Cocaine abuse ([place])   #Major depressive disorder, recurrent episode, severe, without psychotic features -Continue Cymbalta 60 mg daily for depression -Completed  Abilify 2 mg today.  -Continue Abilify 5 mg daily for mood stability and depression   #Generalized anxiety disorder -Continue Cymbalta 60 mg daily for anxiety -Continue gabapentin 400 mg 3 times daily for anxiety.  -Continue Buspar 10 mg po twice daily.   #Insomnia -Continue trazodone 100 mg at bedtime for sleep   #Seizure disorder by history -Continue Keppra 500 mg 2 times daily   #Alcohol use disorder, severe, dependence Initiate lorazepam taper             Lorazepam 1 mg 3 times daily for 2 days             Lorazepam 1 mg 2 times daily for 2 days Lorazepam 0.5 mg 2 times daily for 2 days   CIWA protocol initiated -[place] withdrawal assessment while awake x 48 hours, then BID x 48 hours, then daily x 48 hours then discontinue -Hydroxyzine 25 mg every 6 hours as needed for anxiety/agitation or CIWA < or = 10 -Loperamide 2 to 4 mg as needed for diarrhea or loose stools -Multivitamin 1 tablet daily -Ondansetron 4 mg every 6 hours as needed for nausea/vomiting -Thiamine 100 mg daily -Thiamine 100 mg intramuscular injection once   As needed medications: -Patient to continue taking Tylenol 650 mg every 6 hours as needed for mild pain -Patient to continue taking Maalox/Mylanta 30 mL every 4 hours as needed for indigestion -Patient to continue  taking Milk of Magnesia 30 mL as needed for mild constipation   Discharge Planning: Social work and case management to assist with discharge planning and identification of hospital follow-up needs prior to discharge Estimated LOS: 5-7 days Discharge Concerns: Need to establish a safety plan; Medication compliance and effectiveness Discharge Goals: Return home with outpatient referrals for mental health follow-up including medication management/psychotherapy   Lindell Spar, NP, pmhnp, fnp-bc. 12/17/2022, 2:26 PM Patient ID: [name], male   DOB: 09-29-1991, 29 y.o.   MRN: [id number] Patient ID: Carleton Schnipke, male    DOB: 10-02-1991, 31 y.o.   MRN: [id number] Patient ID: Abdou Dottery, male   DOB: 09/26/1991, 31 y.o.   MRN: [id number]

## 2022-12-18 DIAGNOSIS — F332 Major depressive disorder, recurrent severe without psychotic features: Secondary | ICD-10-CM | POA: Diagnosis not present

## 2022-12-18 LAB — LEVETIRACETAM LEVEL: Levetiracetam Lvl: 12.8 ug/mL (ref 10.0–40.0)

## 2022-12-18 MED ORDER — TRAZODONE HCL 150 MG PO TABS
150.0000 mg | ORAL_TABLET | Freq: Every day | ORAL | Status: DC
  Administered 2022-12-18 – 2022-12-19 (×2): 150 mg via ORAL
  Filled 2022-12-18 (×3): qty 1
  Filled 2022-12-18: qty 7

## 2022-12-18 MED ORDER — NICOTINE 21 MG/24HR TD PT24
21.0000 mg | MEDICATED_PATCH | Freq: Every day | TRANSDERMAL | Status: DC
  Administered 2022-12-18 – 2022-12-20 (×3): 21 mg via TRANSDERMAL
  Filled 2022-12-18 (×5): qty 1

## 2022-12-18 NOTE — Progress Notes (Signed)

## 2022-12-18 NOTE — Progress Notes (Signed)
[facility] MD Progress Note  12/18/2022 5:35 PM John Vaughan  MRN:  [id number]  Subjective:    John Vaughan. John Vaughan is a 31 year old, Caucasian male with a past psychiatric history significant for alcohol abuse, cocaine abuse, substance-induced mood disorder, generalized anxiety disorder, and major depressive disorder who was admitted to [facility] from [facility] ED due to suicidal thoughts in the presence of worsening mood and substance abuse.   Patient was assessed by Karel Jarvis, PA-C for reevaluation.  Patient is currently being managed on the following psychiatric medications:  Cymbalta 60 mg daily Abilify 5 mg daily Gabapentin 400 mg 3 times daily BuSpar 10 mg 2 times daily Trazodone 100 mg at bedtime  Patient endorses elevated depression and rates his depression 9 out of 10.  Patient attributes his depression to the holidays.  Patient acknowledges that he should be admitted for the management of his mental health but wishes he could have spent the holidays with his girlfriend.  Patient reports that his anxiety is bad stating that his anxiety is always bad in the morning if he is unable to sleep through the night.  Patient denies any new stressors but states that he misses his girlfriend.  Patient is tolerating the medication well and states that his medications are making him feel like he normally does after coming off a week long of binging.  Patient denies suicidal or homicidal ideations.  He further denies auditory or visual hallucinations and does not appear to be responding to internal/external stimuli.  Patient denies paranoia or delusions.  Patient endorses intermittent sleep stating that he received on average 7 hours of sleep total last night.  Patient states that he is able to fall asleep but often wakes up during the night.  Patient endorses so-so appetite stating that he had to force himself to eat today.  Patient is casually dressed, alert and  oriented x 4.  Patient is calm, cooperative, and fully engaged in conversation during the encounter.  Patient maintains good eye contact.  Patient's speech is clear, coherent, and with normal rate.  Patient's thought content is organized.  Patient's thought content is clear.  Patient continues to endorse depression and anxiety with congruent affect.  Patient denies suicidal ideations.  Patient does not appear to be responding to internal/external stimuli.  Principal Problem: MDD (major depressive disorder), recurrent episode, severe (HCC) Diagnosis: Principal Problem:   MDD (major depressive disorder), recurrent episode, severe (HCC) Active Problems:   GAD (generalized anxiety disorder)   Alcohol use disorder, severe, dependence (HCC)   Tobacco abuse   Cocaine abuse (HCC)  Total Time spent with patient: 25 minutes  Past Psychiatric History:  Major depressive disorder Generalized anxiety disorder Insomnia Alcohol use disorder Alcohol withdrawal Cocaine abuse Tobacco abuse  Past Medical History:  Past Medical History:  Diagnosis Date   Alcohol abuse    Anxiety    Bipolar 2 disorder (HCC)    Depression    Seizures (HCC)     Past Surgical History:  Procedure Laterality Date   HAND SURGERY Right    Family History:  Family History  Problem Relation Age of Onset   Anxiety disorder Mother    Alcohol abuse Father    Anxiety disorder Maternal Aunt    Anxiety disorder Maternal Grandmother    Alcohol abuse Paternal Grandfather    Family Psychiatric  History:  Father - Alcohol abuse Mother - anxiety disorder Grandfather (paternal) - alcohol abuse Grandmother (maternal) - anxiety disorder Aunt (  maternal) - anxiety disorder  Social History:  Social History   Substance and Sexual Activity  Alcohol Use Yes   Alcohol/week: 2.0 standard drinks of alcohol   Types: 2 Cans of beer per week   Comment: 48 beers a week     Social History   Substance and Sexual Activity  Drug Use  Yes   Types: Cocaine    Social History   Socioeconomic History   Marital status: Single    Spouse name: Not on file   Number of children: Not on file   Years of education: Not on file   Highest education level: Not on file  Occupational History   Not on file  Tobacco Use   Smoking status: Every Day    Packs/day: 1.50    Types: Cigarettes   Smokeless tobacco: Former    Types: Snuff   Tobacco comments:    Pt states plans to stop drinking. Will deal with tobacco cessation later.  Vaping Use   Vaping Use: Never used  Substance and Sexual Activity   Alcohol use: Yes    Alcohol/week: 2.0 standard drinks of alcohol    Types: 2 Cans of beer per week    Comment: 48 beers a week   Drug use: Yes    Types: Cocaine   Sexual activity: Yes  Other Topics Concern   Not on file  Social History Narrative   Not on file   Social Determinants of Health   Financial Resource Strain: Not on file  Food Insecurity: No Food Insecurity (12/16/2022)   Hunger Vital Sign    Worried About Running Out of Food in the Last Year: Never true    Ran Out of Food in the Last Year: Never true  Transportation Needs: No Transportation Needs (12/16/2022)   PRAPARE - Administrator, Civil ServiceTransportation    Lack of Transportation (Medical): No    Lack of Transportation (Non-Medical): No  Physical Activity: Not on file  Stress: Not on file  Social Connections: Not on file   Additional Social History:  See H&P  Sleep: Fair  Appetite:  Fair  Current Medications: Current Facility-Administered Medications  Medication Dose Route Frequency Provider Last Rate Last Admin   acetaminophen (TYLENOL) tablet 650 mg  650 mg Oral Q6H PRN Leevy-Johnson, Brooke A, NP       alum & mag hydroxide-simeth (MAALOX/MYLANTA) 200-200-20 MG/5ML suspension 30 mL  30 mL Oral Q4H PRN Leevy-Johnson, Brooke A, NP       ARIPiprazole (ABILIFY) tablet 5 mg  5 mg Oral Daily Bricia Taher E, PA   5 mg at 12/18/22 0742   busPIRone (BUSPAR) tablet 10 mg  10  mg Oral BID Keary Hanak E, PA   10 mg at 12/18/22 1654   DULoxetine (CYMBALTA) DR capsule 60 mg  60 mg Oral Daily Teal Raben E, PA   60 mg at 12/18/22 0742   gabapentin (NEURONTIN) capsule 400 mg  400 mg Oral TID Sarita BottomAttiah, Nadir, MD   400 mg at 12/18/22 1654   hydrOXYzine (ATARAX) tablet 25 mg  25 mg Oral Q6H PRN Mickala Laton E, PA   25 mg at 12/17/22 2126   levETIRAcetam (KEPPRA) tablet 500 mg  500 mg Oral BID Abbott PaoAttiah, Nadir, MD   500 mg at 12/18/22 1653   loperamide (IMODIUM) capsule 2-4 mg  2-4 mg Oral PRN Kimia Finan E, PA       LORazepam (ATIVAN) tablet 1 mg  1 mg Oral BID Mckayla Mulcahey, Tommas OlpUchenna E, PA  1 mg at 12/18/22 1655   Followed by   Melene Muller ON 12/20/2022] LORazepam (ATIVAN) tablet 0.5 mg  0.5 mg Oral Daily Huyen Perazzo E, PA       magnesium hydroxide (MILK OF MAGNESIA) suspension 30 mL  30 mL Oral Daily PRN Leevy-Johnson, Brooke A, NP       multivitamin with minerals tablet 1 tablet  1 tablet Oral Daily Haylen Bellotti E, PA   1 tablet at 12/18/22 0742   nicotine (NICODERM CQ - dosed in mg/24 hours) patch 21 mg  21 mg Transdermal Daily Attiah, Nadir, MD   21 mg at 12/18/22 1157   ondansetron (ZOFRAN-ODT) disintegrating tablet 4 mg  4 mg Oral Q6H PRN Marieelena Bartko E, PA   4 mg at 12/16/22 1531   thiamine (Vitamin B-1) tablet 100 mg  100 mg Oral Daily Raygen Linquist E, PA   100 mg at 12/18/22 0742   traZODone (DESYREL) tablet 150 mg  150 mg Oral QHS Dvante Hands E, PA        Lab Results: No results found for this or any previous visit (from the past 48 hour(s)).  Blood Alcohol level:  Lab Results  Component Value Date   ETH 281 (H) 12/15/2022   ETH 346 (HH) 09/19/2022    Metabolic Disorder Labs: Lab Results  Component Value Date   HGBA1C 5.4 05/14/2022   MPG 108.28 05/14/2022   MPG 102.54 10/25/2021   No results found for: "PROLACTIN" Lab Results  Component Value Date   CHOL 196 05/14/2022   TRIG 93 05/14/2022   HDL 57 05/14/2022   CHOLHDL 3.4 05/14/2022   VLDL  19 05/14/2022   LDLCALC 120 (H) 05/14/2022   LDLCALC 93 10/25/2021    Physical Findings: AIMS: Facial and Oral Movements Muscles of Facial Expression: None, normal Lips and Perioral Area: None, normal Jaw: None, normal Tongue: None, normal,Extremity Movements Upper (arms, wrists, hands, fingers): None, normal Lower (legs, knees, ankles, toes): None, normal, Trunk Movements Neck, shoulders, hips: None, normal, Overall Severity Severity of abnormal movements (highest score from questions above): None, normal Incapacitation due to abnormal movements: None, normal Patient's awareness of abnormal movements (rate only patient's report): No Awareness, Dental Status Current problems with teeth and/or dentures?: No Does patient usually wear dentures?: No  CIWA:  CIWA-Ar Total: 1 COWS:     Musculoskeletal: Strength & Muscle Tone: within normal limits Gait & Station: normal Patient leans: N/A  Psychiatric Specialty Exam:  Presentation  General Appearance:  Appropriate for Environment; Fairly Groomed  Eye Contact: Good  Speech: Clear and Coherent; Normal Rate  Speech Volume: Normal  Handedness: Right   Mood and Affect  Mood: Anxious; Depressed  Affect: Congruent; Blunt   Thought Process  Thought Processes: Coherent; Goal Directed  Descriptions of Associations:Intact  Orientation:Full (Time, Place and Person)  Thought Content:Logical  History of Schizophrenia/Schizoaffective disorder:No  Duration of Psychotic Symptoms:N/A  Hallucinations:Hallucinations: None  Ideas of Reference:None  Suicidal Thoughts:Suicidal Thoughts: No SI Passive Intent and/or Plan: Without Intent; Without Plan; Without Means to Carry Out; Without Access to Means  Homicidal Thoughts:Homicidal Thoughts: No   Sensorium  Memory: Immediate Good; Recent Good; Remote Good  Judgment: Fair  Insight: Fair   Art therapist  Concentration: Good  Attention  Span: Good  Recall: Good  Fund of Knowledge: Good  Language: Good   Psychomotor Activity  Psychomotor Activity: Psychomotor Activity: Normal   Assets  Assets: Communication Skills; Desire for Improvement; Physical Health; Social Support; Resilience   Sleep  Sleep: Sleep: Fair Number of Hours of Sleep: 5.5    Physical Exam: Physical Exam Constitutional:      Appearance: Normal appearance.  HENT:     Head: Normocephalic and atraumatic.     Nose: Nose normal.     Mouth/Throat:     Mouth: Mucous membranes are moist.  Eyes:     Extraocular Movements: Extraocular movements intact.     Pupils: Pupils are equal, round, and reactive to light.  Cardiovascular:     Rate and Rhythm: Normal rate and regular rhythm.  Pulmonary:     Effort: Pulmonary effort is normal.     Breath sounds: Normal breath sounds.  Abdominal:     General: Abdomen is flat.  Musculoskeletal:        General: Normal range of motion.     Cervical back: Normal range of motion and neck supple.  Skin:    General: Skin is warm and dry.  Neurological:     General: No focal deficit present.     Mental Status: He is alert and oriented to person, place, and time.  Psychiatric:        Attention and Perception: Attention and perception normal. He does not perceive auditory or visual hallucinations.        Mood and Affect: Mood is anxious and depressed. Affect is blunt.        Speech: Speech normal.        Behavior: Behavior normal. Behavior is cooperative.        Thought Content: Thought content normal. Thought content is not paranoid or delusional. Thought content does not include homicidal or suicidal ideation.        Cognition and Memory: Cognition and memory normal.        Judgment: Judgment normal.    Review of Systems  Constitutional: Negative.   HENT: Negative.    Eyes: Negative.   Respiratory: Negative.    Cardiovascular: Negative.   Gastrointestinal: Negative.   Skin: Negative.    Neurological: Negative.   Psychiatric/Behavioral:  Positive for depression and substance abuse. Negative for hallucinations and suicidal ideas. The patient is nervous/anxious. The patient does not have insomnia.    Blood pressure 120/88, pulse 85, temperature 98.3 F (36.8 C), temperature source Oral, resp. rate 16, height 6\' 1"  (1.854 m), weight 92.5 kg, SpO2 97 %. Body mass index is 26.91 kg/m.   Treatment Plan Summary:  Daily contact with patient to assess and evaluate symptoms and progress in treatment and Medication management  Plan:  Patient continues to endorse elevated depression attributed to the holidays and not being able to spend time with his girlfriend.  Patient also endorses anxiety attributed to issues with sleep the night prior.  Patient reports that Remeron has been helpful in the management of his sleep in the past.  Patient is currently taking trazodone for the management of his sleep.  Patient trazodone to be increased to 150 mg at bedtime for the management of his sleep.  If patient continues to have issues with sleep, provider will consider discontinuing trazodone in favor for Remeron.  Safety and Monitoring: Voluntary admission to inpatient psychiatric unit for safety, stabilization and treatment Daily contact with patient to assess and evaluate symptoms and progress in treatment Patient's case to be discussed in multi-disciplinary team meeting Observation Level : q15 minute checks Vital signs: q12 hours Precautions: safety  Diagnosis: Principal Problem:   MDD (major depressive disorder), recurrent episode, severe (HCC) Active Problems:   GAD (generalized anxiety  disorder)   Alcohol use disorder, severe, dependence (HCC)   Tobacco abuse   Cocaine abuse (HCC)  #Major depressive disorder, recurrent episode, severe without psychotic features -Continue Cymbalta 60 mg daily for depression -Continue Abilify 5 mg daily for mood stability and  depression  #Generalized anxiety disorder -Continue Cymbalta 60 mg daily for anxiety -Continue gabapentin 400 mg 3 times daily for anxiety  #Insomnia -Increase trazodone from 100 to 150 mg at bedtime for the management of sleep  #Seizure disorder by history -Continue Keppra 500 mg 2 times daily  #Alcohol use disorder, severe, dependence Continue lorazepam taper  -Lorazepam 1 mg 3 times daily for 2 days, completed  -Lorazepam 1 mg 2 times daily for 2 days  -Lorazepam 0.5 mg 2 times daily for 2 days  CIWA protocol initiated -[facility] withdrawal assessment while awake x 48 hours, then BID x 48 hours, then daily x 48 hours then discontinue -Hydroxyzine 25 mg every 6 hours as needed for anxiety/agitation or CIWA < or = 10 -Loperamide 2 to 4 mg as needed for diarrhea or loose stools -Multivitamin 1 tablet daily -Ondansetron 4 mg every 6 hours as needed for nausea/vomiting -Thiamine 100 mg daily -Thiamine 100 mg intramuscular injection once  As needed medications: -Patient to continue taking Tylenol 650 mg every 6 hours as needed for mild pain -Patient to continue taking Maalox/Mylanta 30 mL every 4 hours as needed for indigestion -Patient to continue taking Milk of Magnesia 30 mL as needed for mild constipation  Discharge Planning: Social work and case management to assist with discharge planning and identification of hospital follow-up needs prior to discharge Estimated LOS: 5-7 days Discharge Concerns: Need to establish a safety plan; Medication compliance and effectiveness Discharge Goals: Return home with outpatient referrals for mental health follow-up including medication management/psychotherapy   I certify that inpatient services furnished can reasonably be expected to improve the patient's condition.     Meta Hatchet, PA 12/18/2022, 5:35 PM

## 2022-12-18 NOTE — Group Note (Unsigned)
Date:  12/18/2022 Time:  9:57 AM  Group Topic/Focus:  Goals Group:   The focus of this group is to help patients establish daily goals to achieve during treatment and discuss how the patient can incorporate goal setting into their daily lives to aide in recovery.     Participation Level:  {BHH PARTICIPATION RCVKF:84037}  Participation Quality:  {BHH PARTICIPATION QUALITY:22265}  Affect:  {BHH AFFECT:22266}  Cognitive:  {BHH COGNITIVE:22267}  Insight: {BHH Insight2:20797}  Engagement in Group:  {BHH ENGAGEMENT IN VOHKG:67703}  Modes of Intervention:  {BHH MODES OF INTERVENTION:22269}  Additional Comments:  ***  John Vaughan 12/18/2022, 9:57 AM

## 2022-12-18 NOTE — Progress Notes (Signed)
   12/18/22 0800  Psych Admission Type (Psych Patients Only)  Admission Status Involuntary  Psychosocial Assessment  Patient Complaints Depression  Eye Contact Fair  Facial Expression Sad  Affect Appropriate to circumstance  Speech Logical/coherent  Interaction Assertive  Motor Activity Slow  Appearance/Hygiene Unremarkable  Behavior Characteristics Cooperative;Appropriate to situation;Calm  Mood Depressed  Thought Process  Coherency WDL  Content WDL  Delusions None reported or observed  Perception WDL  Hallucination None reported or observed  Judgment Poor  Confusion None  Danger to Self  Current suicidal ideation? Denies  Agreement Not to Harm Self Yes  Description of Agreement Verbal  Danger to Others  Danger to Others None reported or observed

## 2022-12-18 NOTE — Group Note (Signed)
LCSW Group Therapy Note  Group Date: 12/18/2022 Start Time: 1100 End Time: 1200   Type of Therapy and Topic:  Group Therapy - How To Cope with Nervousness about Discharge   Participation Level:  Active   Description of Group This process group involved identification of patients' feelings about discharge. Some of them are scheduled to be discharged soon, while others are new admissions, but each of them was asked to share thoughts and feelings surrounding discharge from the hospital. One common theme was that they are excited at the prospect of going home, while another was that many of them are apprehensive about sharing why they were hospitalized. Patients were given the opportunity to discuss these feelings with their peers in preparation for discharge.  Therapeutic Goals  Patient will identify their overall feelings about pending discharge. Patient will think about how they might proactively address issues that they believe will once again arise once they get home (i.e. with parents). Patients will participate in discussion about having hope for change.   Summary of Patient Progress:  John Vaughan was very active throughout the session. John Vaughan demonstrated appropriate insight into the subject matter, and proved open to input from peers and feedback from CSW. John Vaughan was respectful of peers and participated throughout the entire session.   Therapeutic Modalities Cognitive Behavioral Therapy   Ronnell Freshwater Ladd Cen, LCSW 12/18/2022  1:40 PM

## 2022-12-18 NOTE — Group Note (Signed)
Date:  12/18/2022 Time:  9:53 AM  Group Topic/Focus:  Goals Group:   The focus of this group is to help patients establish daily goals to achieve during treatment and discuss how the patient can incorporate goal setting into their daily lives to aide in recovery. Orientation:   The focus of this group is to educate the patient on the purpose and policies of crisis stabilization and provide a format to answer questions about their admission.  The group details unit policies and expectations of patients while admitted.    Participation Level:  Active  Participation Quality:  Appropriate  Affect:  Appropriate  Cognitive:  Appropriate  Insight: Appropriate  Engagement in Group:  Engaged  Modes of Intervention:  Education  Additional Comments:  PT's Goal is to stay medicated  Gwinda Maine 12/18/2022, 9:53 AM

## 2022-12-18 NOTE — Group Note (Signed)
Recreation Therapy Group Note   Group Topic:Animal Assisted Therapy   Group Date: 12/18/2022 Start Time: 1430 End Time: 1505 Facilitators: Naijah Lacek-McCall, LRT,CTRS Location: [address] Dayroom   Animal-Assisted Activity (AAA) Program Checklist/Progress Notes Patient Eligibility Criteria Checklist & Daily Group note for Rec Tx Intervention  AAA/T Program Assumption of Risk Form signed by Patient/ or Parent Legal Guardian Yes  Patient is free of allergies or severe asthma Yes  Patient reports no fear of animals Yes  Patient reports no history of cruelty to animals Yes  Patient understands his/her participation is voluntary Yes  Patient washes hands before animal contact Yes  Patient washes hands after animal contact Yes   Affect/Mood: Appropriate   Participation Level: Engaged   Participation Quality: Independent   Behavior: Appropriate    Clinical Observations/Individualized Feedback:  Patient attended session and interacted appropriately with therapy dog and peers. Patient asked appropriate questions about therapy dog and his training. Patient shared stories about their pets at home with group.   Plan: Continue to engage patient in RT group sessions 2-3x/week.   Ena Demary-McCall, LRT,CTRS 12/18/2022 3:38 PM

## 2022-12-18 NOTE — Progress Notes (Signed)
   12/18/22 2300  Psych Admission Type (Psych Patients Only)  Admission Status Involuntary  Psychosocial Assessment  Patient Complaints Depression  Eye Contact Fair  Facial Expression Sad  Affect Appropriate to circumstance  Speech Logical/coherent  Interaction Assertive  Motor Activity Tremors  Appearance/Hygiene Unremarkable  Behavior Characteristics Cooperative;Appropriate to situation  Mood Pleasant;Depressed  Thought Process  Content WDL  Delusions None reported or observed  Perception WDL  Hallucination None reported or observed  Judgment Poor  Confusion None  Danger to Self  Current suicidal ideation? Denies  Agreement Not to Harm Self Yes  Description of Agreement Verbal  Danger to Others  Danger to Others None reported or observed

## 2022-12-18 NOTE — B[facility] Group Notes (Signed)
Adult Psychoeducational Group Note  Date:  12/18/2022 Time:  3:01 PM  Group Topic/Focus:  Healthy Communication:   The focus of this group is to discuss communication, barriers to communication, as well as healthy ways to communicate with others.  Participation Level:  Active  Participation Quality:  Appropriate  Affect:  Appropriate  Cognitive:  Appropriate  Insight: Appropriate  Engagement in Group:  Engaged  Modes of Intervention:  Education  Additional Comments:  PT participated in group being Impeccable with your words.  Gwinda Maine 12/18/2022, 3:01 PM

## 2022-12-19 DIAGNOSIS — F332 Major depressive disorder, recurrent severe without psychotic features: Secondary | ICD-10-CM | POA: Diagnosis not present

## 2022-12-19 MED ORDER — HYDROXYZINE HCL 25 MG PO TABS
25.0000 mg | ORAL_TABLET | Freq: Three times a day (TID) | ORAL | Status: DC | PRN
  Administered 2022-12-19 – 2022-12-20 (×2): 25 mg via ORAL
  Filled 2022-12-19 (×2): qty 1

## 2022-12-19 MED ORDER — PANTOPRAZOLE SODIUM 40 MG PO TBEC
40.0000 mg | DELAYED_RELEASE_TABLET | Freq: Every day | ORAL | Status: DC
  Administered 2022-12-19 – 2022-12-20 (×2): 40 mg via ORAL
  Filled 2022-12-19 (×5): qty 1
  Filled 2022-12-19: qty 7

## 2022-12-19 NOTE — Progress Notes (Signed)
[facility] MD Progress Note  12/19/2022 8:58 AM John Vaughan  MRN:  [id number]   John Vaughan. [name] is a 31 year old, Caucasian male with a past psychiatric history significant for alcohol abuse, cocaine abuse, substance-induced mood disorder, generalized anxiety disorder, and major depressive disorder who was admitted to [facility] from [facility] ED due to suicidal thoughts in the presence of worsening mood and substance abuse.   Daily notes: John Vaughan is seen, chart reviewed. The chart findings discussed with the treatment team. He presents alert, oriented & aware of situation. He is visible on the unit, attending group sessions. Says group sessions are helpful to him. He says he is doing much better mentally. He says he feels he is ready to be discharged. Says his friend Ivar Drape in [place] in [place] has asked him to come live with him after discharge. He says this is a sober environment that he needs. He also states that he will never stop taking his medications as he has learned his lessons this time around. He says he will resume AA meetings after discharge. John Vaughan currently denies any SIHI, ABVH, delusional thoughts or paranoia. He does not appear to be responding to any internal stimuli. There are no changes made on his current plan of care. Reviewed labs & vital signs. Both are stable.  Principal Problem: MDD (major depressive disorder), recurrent episode, severe (HCC) Diagnosis: Principal Problem:   MDD (major depressive disorder), recurrent episode, severe (HCC) Active Problems:   Alcohol use disorder, severe, dependence (HCC)   GAD (generalized anxiety disorder)   Tobacco abuse   Cocaine abuse (HCC)  Total Time spent with patient: 25 minutes  Past Psychiatric History:  Major depressive disorder Generalized anxiety disorder Insomnia Alcohol use disorder Alcohol withdrawal Cocaine abuse Tobacco abuse  Past Medical History:  Past Medical History:   Diagnosis Date   Alcohol abuse    Anxiety    Bipolar 2 disorder (HCC)    Depression    Seizures (HCC)     Past Surgical History:  Procedure Laterality Date   HAND SURGERY Right    Family History:  Family History  Problem Relation Age of Onset   Anxiety disorder Mother    Alcohol abuse Father    Anxiety disorder Maternal Aunt    Anxiety disorder Maternal Grandmother    Alcohol abuse Paternal Grandfather    Family Psychiatric  History:  Father - Alcohol abuse Mother - anxiety disorder Grandfather (paternal) - alcohol abuse Grandmother (maternal) - anxiety disorder Aunt (maternal) - anxiety disorder  Social History:  Social History   Substance and Sexual Activity  Alcohol Use Yes   Alcohol/week: 2.0 standard drinks of alcohol   Types: 2 Cans of beer per week   Comment: 48 beers a week     Social History   Substance and Sexual Activity  Drug Use Yes   Types: Cocaine    Social History   Socioeconomic History   Marital status: Single    Spouse name: Not on file   Number of children: Not on file   Years of education: Not on file   Highest education level: Not on file  Occupational History   Not on file  Tobacco Use   Smoking status: Every Day    Packs/day: 1.50    Types: Cigarettes   Smokeless tobacco: Former    Types: Snuff   Tobacco comments:    Pt states plans to stop drinking. Will deal with tobacco cessation later.  Vaping Use  Vaping Use: Never used  Substance and Sexual Activity   Alcohol use: Yes    Alcohol/week: 2.0 standard drinks of alcohol    Types: 2 Cans of beer per week    Comment: 48 beers a week   Drug use: Yes    Types: Cocaine   Sexual activity: Yes  Other Topics Concern   Not on file  Social History Narrative   Not on file   Social Determinants of Health   Financial Resource Strain: Not on file  Food Insecurity: No Food Insecurity (12/16/2022)   Hunger Vital Sign    Worried About Running Out of Food in the Last Year:  Never true    Ran Out of Food in the Last Year: Never true  Transportation Needs: No Transportation Needs (12/16/2022)   PRAPARE - Administrator, Civil Service (Medical): No    Lack of Transportation (Non-Medical): No  Physical Activity: Not on file  Stress: Not on file  Social Connections: Not on file   Additional Social History:  See H&P  Sleep: Fair  Appetite:  Fair  Current Medications: Current Facility-Administered Medications  Medication Dose Route Frequency Provider Last Rate Last Admin   acetaminophen (TYLENOL) tablet 650 mg  650 mg Oral Q6H PRN Leevy-Johnson, Brooke A, NP       alum & mag hydroxide-simeth (MAALOX/MYLANTA) 200-200-20 MG/5ML suspension 30 mL  30 mL Oral Q4H PRN Leevy-Johnson, Brooke A, NP       ARIPiprazole (ABILIFY) tablet 5 mg  5 mg Oral Daily Sharonica Kraszewski, Uchenna E, PA   5 mg at 12/19/22 0758   busPIRone (BUSPAR) tablet 10 mg  10 mg Oral BID Baillie Mohammad, Uchenna E, PA   10 mg at 12/19/22 0758   DULoxetine (CYMBALTA) DR capsule 60 mg  60 mg Oral Daily Faith Patricelli, Uchenna E, PA   60 mg at 12/19/22 0758   gabapentin (NEURONTIN) capsule 400 mg  400 mg Oral TID Sarita Bottom, MD   400 mg at 12/19/22 0758   levETIRAcetam (KEPPRA) tablet 500 mg  500 mg Oral BID Abbott Pao, Nadir, MD   500 mg at 12/19/22 0758   LORazepam (ATIVAN) tablet 1 mg  1 mg Oral BID Piedad Standiford, Uchenna E, PA   1 mg at 12/19/22 2778   Followed by   Melene Muller ON 12/20/2022] LORazepam (ATIVAN) tablet 0.5 mg  0.5 mg Oral Daily Chevon Laufer, Uchenna E, PA       magnesium hydroxide (MILK OF MAGNESIA) suspension 30 mL  30 mL Oral Daily PRN Leevy-Johnson, Brooke A, NP       multivitamin with minerals tablet 1 tablet  1 tablet Oral Daily Lorenza Shakir, Uchenna E, PA   1 tablet at 12/19/22 0758   nicotine (NICODERM CQ - dosed in mg/24 hours) patch 21 mg  21 mg Transdermal Daily Attiah, Nadir, MD   21 mg at 12/19/22 0801   thiamine (Vitamin B-1) tablet 100 mg  100 mg Oral Daily Emerson Barretto, Uchenna E, PA   100 mg at 12/19/22 0758    traZODone (DESYREL) tablet 150 mg  150 mg Oral QHS Wei Newbrough, Uchenna E, PA   150 mg at 12/18/22 2036    Lab Results: No results found for this or any previous visit (from the past 48 hour(s)).  Blood Alcohol level:  Lab Results  Component Value Date   ETH 281 (H) 12/15/2022   ETH 346 (HH) 09/19/2022    Metabolic Disorder Labs: Lab Results  Component Value Date   HGBA1C 5.4 05/14/2022  MPG 108.28 05/14/2022   MPG 102.54 10/25/2021   No results found for: "PROLACTIN" Lab Results  Component Value Date   CHOL 196 05/14/2022   TRIG 93 05/14/2022   HDL 57 05/14/2022   CHOLHDL 3.4 05/14/2022   VLDL 19 05/14/2022   LDLCALC 120 (H) 05/14/2022   LDLCALC 93 10/25/2021    Physical Findings: AIMS: Facial and Oral Movements Muscles of Facial Expression: None, normal Lips and Perioral Area: None, normal Jaw: None, normal Tongue: None, normal,Extremity Movements Upper (arms, wrists, hands, fingers): None, normal Lower (legs, knees, ankles, toes): None, normal, Trunk Movements Neck, shoulders, hips: None, normal, Overall Severity Severity of abnormal movements (highest score from questions above): None, normal Incapacitation due to abnormal movements: None, normal Patient's awareness of abnormal movements (rate only patient's report): No Awareness, Dental Status Current problems with teeth and/or dentures?: No Does patient usually wear dentures?: No  CIWA:  CIWA-Ar Total: 0 COWS:     Musculoskeletal: Strength & Muscle Tone: within normal limits Gait & Station: normal Patient leans: N/A  Psychiatric Specialty Exam:  Presentation  General Appearance:  Appropriate for Environment; Fairly Groomed  Eye Contact: Good  Speech: Clear and Coherent; Normal Rate  Speech Volume: Normal  Handedness: Right   Mood and Affect  Mood: Anxious; Depressed  Affect: Congruent; Blunt   Thought Process  Thought Processes: Coherent; Goal Directed  Descriptions of  Associations:Intact  Orientation:Full (Time, Place and Person)  Thought Content:Logical  History of Schizophrenia/Schizoaffective disorder:No  Duration of Psychotic Symptoms:N/A  Hallucinations:Hallucinations: None  Ideas of Reference:None  Suicidal Thoughts:Suicidal Thoughts: No  Homicidal Thoughts:Homicidal Thoughts: No   Sensorium  Memory: Immediate Good; Recent Good; Remote Good  Judgment: Fair  Insight: Fair   Art therapistxecutive Functions  Concentration: Good  Attention Span: Good  Recall: Good  Fund of Knowledge: Good  Language: Good   Psychomotor Activity  Psychomotor Activity: Psychomotor Activity: Normal   Assets  Assets: Communication Skills; Desire for Improvement; Physical Health; Social Support; Resilience   Sleep  Sleep: Sleep: Fair    Physical Exam: Physical Exam Constitutional:      Appearance: Normal appearance.  HENT:     Head: Normocephalic and atraumatic.     Nose: Nose normal.     Mouth/Throat:     Mouth: Mucous membranes are moist.  Eyes:     Extraocular Movements: Extraocular movements intact.     Pupils: Pupils are equal, round, and reactive to light.  Cardiovascular:     Rate and Rhythm: Normal rate and regular rhythm.  Pulmonary:     Effort: Pulmonary effort is normal.     Breath sounds: Normal breath sounds.  Abdominal:     General: Abdomen is flat.  Musculoskeletal:        General: Normal range of motion.     Cervical back: Normal range of motion and neck supple.  Skin:    General: Skin is warm and dry.  Neurological:     General: No focal deficit present.     Mental Status: He is alert and oriented to person, place, and time.  Psychiatric:        Attention and Perception: Attention and perception normal. He does not perceive auditory or visual hallucinations.        Mood and Affect: Mood is anxious and depressed. Affect is blunt.        Speech: Speech normal.        Behavior: Behavior normal. Behavior is  cooperative.  Thought Content: Thought content normal. Thought content is not paranoid or delusional. Thought content does not include homicidal or suicidal ideation.        Cognition and Memory: Cognition and memory normal.        Judgment: Judgment normal.    Review of Systems  Constitutional: Negative.   HENT: Negative.    Eyes: Negative.   Respiratory: Negative.    Cardiovascular: Negative.   Gastrointestinal: Negative.   Skin: Negative.   Neurological: Negative.   Psychiatric/Behavioral:  Positive for depression and substance abuse. Negative for hallucinations and suicidal ideas. The patient is nervous/anxious. The patient does not have insomnia.    Blood pressure 109/84, pulse 82, temperature 97.7 F (36.5 C), temperature source Oral, resp. rate 16, height  (1.854 m), weight 92.5 kg, SpO2 100 %. Body mass index is 26.91 kg/m.   Treatment Plan Summary:  Daily contact with patient to assess and evaluate symptoms and progress in treatment and Medication management.   Continue inpatient hospitalization.  Will continue today 12/19/2022 plan as below except where it is noted.   Plan:  Safety and Monitoring: Voluntary admission to inpatient psychiatric unit for safety, stabilization and treatment Daily contact with patient to assess and evaluate symptoms and progress in treatment Patient's case to be discussed in multi-disciplinary team meeting Observation Level : q15 minute checks Vital signs: q12 hours Precautions: safety  Diagnosis: Principal Problem:   MDD (major depressive disorder), recurrent episode, severe (HCC) Active Problems:   GAD (generalized anxiety disorder)   Alcohol use disorder, severe, dependence (HCC)   Tobacco abuse   Cocaine abuse (HCC)  #Major depressive disorder, recurrent episode, severe without psychotic features -Continue Cymbalta 60 mg daily for depression -Continue Abilify 5 mg daily for mood stability and  depression  #Generalized anxiety disorder -Continue Cymbalta 60 mg daily for anxiety -Continue gabapentin 400 mg 3 times daily for anxiety  #Insomnia -Continue trazodone 150 mg at bedtime for the management of sleep  #Seizure disorder by history -Continue Keppra 500 mg 2 times daily  #Alcohol use disorder, severe, dependence Continue lorazepam taper  -Lorazepam 1 mg 3 times daily for 2 days, completed  -Lorazepam 1 mg 2 times daily for 2 days  -Lorazepam 0.5 mg 2 times daily for 2 days  CIWA protocol initiated -[facility] withdrawal assessment while awake x 48 hours, then BID x 48 hours, then daily x 48 hours then discontinue -Hydroxyzine 25 mg every 6 hours as needed for anxiety/agitation or CIWA < or = 10 -Loperamide 2 to 4 mg as needed for diarrhea or loose stools -Multivitamin 1 tablet daily -Ondansetron 4 mg every 6 hours as needed for nausea/vomiting -Thiamine 100 mg daily -Thiamine 100 mg intramuscular injection once  As needed medications: -Patient to continue taking Tylenol 650 mg every 6 hours as needed for mild pain -Patient to continue taking Maalox/Mylanta 30 mL every 4 hours as needed for indigestion -Patient to continue taking Milk of Magnesia 30 mL as needed for mild constipation  Discharge Planning: Social work and case management to assist with discharge planning and identification of hospital follow-up needs prior to discharge Estimated LOS: 5-7 days Discharge Concerns: Need to establish a safety plan; Medication compliance and effectiveness Discharge Goals: Return home with outpatient referrals for mental health follow-up including medication management/psychotherapy   I certify that inpatient services furnished can reasonably be expected to improve the patient's condition.     Armandina Stammer, NP, pmhnp, fnp-bc. 12/19/2022, 8:58 AMPatient ID: John Vaughan, male  DOB: 08-Oct-1991, 31 y.o.   MRN: [id number]

## 2022-12-19 NOTE — Group Note (Unsigned)
Date:  12/19/2022 Time:  9:35 AM  Group Topic/Focus:  Goals Group:   The focus of this group is to help patients establish daily goals to achieve during treatment and discuss how the patient can incorporate goal setting into their daily lives to aide in recovery. Orientation:   The focus of this group is to educate the patient on the purpose and policies of crisis stabilization and provide a format to answer questions about their admission.  The group details unit policies and expectations of patients while admitted.     Participation Level:  {BHH PARTICIPATION XUXYB:33832}  Participation Quality:  {BHH PARTICIPATION QUALITY:22265}  Affect:  {BHH AFFECT:22266}  Cognitive:  {BHH COGNITIVE:22267}  Insight: {BHH Insight2:20797}  Engagement in Group:  {BHH ENGAGEMENT IN NVBTY:60600}  Modes of Intervention:  {BHH MODES OF INTERVENTION:22269}  Additional Comments:  ***  Jaquita Rector 12/19/2022, 9:35 AM

## 2022-12-19 NOTE — Progress Notes (Signed)
   12/19/22 2200  Psych Admission Type (Psych Patients Only)  Admission Status Involuntary  Psychosocial Assessment  Patient Complaints None  Eye Contact Brief  Facial Expression Animated  Affect Appropriate to circumstance  Speech Logical/coherent  Interaction Assertive  Motor Activity Other (Comment) (wnl)  Appearance/Hygiene Unremarkable  Behavior Characteristics Cooperative  Mood Pleasant  Thought Process  Coherency WDL  Content WDL  Delusions None reported or observed  Perception WDL  Hallucination None reported or observed  Judgment WDL  Confusion None  Danger to Self  Current suicidal ideation? Denies  Agreement Not to Harm Self Yes  Description of Agreement Verbal  Danger to Others  Danger to Others None reported or observed

## 2022-12-19 NOTE — BHH Group Notes (Deleted)
BHH Group Notes:  (Nursing/MHT/Case Management/Adjunct)  Date:  12/19/2022  Time:  12:32 AM  Type of Therapy:   wrap up group  Participation Level:  Active  Participation Quality:  Appropriate  Affect:  Appropriate  Cognitive:  Alert and Appropriate  Insight:  Appropriate and Good  Engagement in Group:  Engaged  Modes of Intervention:  Discussion  Summary of Progress/Problems: The pt. Shared with the group how he rates his day with a 7.  He shared how he believes they found the right medication that responds to him.     Annell Greening [place] 12/19/2022, 12:32 AM

## 2022-12-19 NOTE — Group Note (Signed)
Recreation Therapy Group Note   Group Topic:Team Building  Group Date: 12/19/2022 Start Time: 0930 End Time: 0950 Facilitators: Maxximus Gotay-McCall, LRT,CTRS Location: [address] Day[address]   Goal Area(s) Addresses:  Patient will effectively work with peer towards shared goal.  Patient will identify skills used to make activity successful.  Patient will share challenges and verbalize solution-driven approaches used. Patient will identify how skills used during activity can be used to reach post d/c goals.   Group Description: [employer]. Patients were provided the following materials: 5 drinking straws, 5 rubber bands, 5 paper clips, 2 index cards and 2 drinking cups. Using the provided materials patients were asked to build a launching mechanism to launch a ping pong ball across the room, approximately 10 feet. Patients were divided into teams of 3-5. Instructions required all materials be incorporated into the device, functionality of items left to the peer group's discretion.   Affect/Mood: Appropriate   Participation Level: Engaged   Participation Quality: Independent   Behavior: Appropriate   Speech/Thought Process: Focused   Insight: Good   Judgement: Good   Modes of Intervention: STEM Activity   Patient Response to Interventions:  Engaged   Education Outcome:  Acknowledges education and In group clarification offered    Clinical Observations/Individualized Feedback: Pt was bright and engaged.  Pt worked well with peers in Airline pilot.  Pt was appropriate during session.     Plan: Continue to engage patient in RT group sessions 2-3x/week.   Kaliana Albino-McCall, LRT,CTRS  12/19/2022 12:39 PM

## 2022-12-19 NOTE — Progress Notes (Signed)
   12/19/22 0656  15 Minute Checks  Location Cafeteria  Visual Appearance Calm  Behavior Composed  Sleep (Behavioral Health Patients Only)  Calculate sleep? (Click Yes once per 24 hr at 0600 safety check) Yes  Documented sleep last 24 hours 9

## 2022-12-19 NOTE — Group Note (Signed)
Date:  12/19/2022 Time:  2:44 PM  Group Topic/Focus:  Self Care:   The focus of this group is to help patients understand the importance of self-care in order to improve or restore emotional, physical, spiritual, interpersonal, and financial health.    Participation Level:  Active  Participation Quality:  Appropriate  Affect:  Appropriate  Cognitive:  Appropriate  Insight: Appropriate  Engagement in Group:  Engaged  Modes of Intervention:  Discussion  Additional Comments:  Pt went over Self-encouragement handouts.  Jaquita Rector 12/19/2022, 2:44 PM

## 2022-12-19 NOTE — BHH Group Notes (Signed)
B[facility] Group Notes:  (Nursing/MHT/Case Management/Adjunct)  Date:  12/19/2022  Time:  12:45 AM  Type of Therapy:   wrap up group   Participation Level:  Active  Participation Quality:  Appropriate  Affect:  Appropriate  Cognitive:  Alert and Appropriate  Insight:  Appropriate  Engagement in Group:  Engaged  Modes of Intervention:  Discussion  Summary of Progress/Problems: Pt shared with group how today started out rocky but towards the end he says it was an amazing day.   Annell Greening [place] 12/19/2022, 12:45 AM

## 2022-12-19 NOTE — Progress Notes (Signed)
   12/19/22 0758  Psych Admission Type (Psych Patients Only)  Admission Status Involuntary  Psychosocial Assessment  Patient Complaints None  Eye Contact Brief  Facial Expression Animated  Affect Appropriate to circumstance  Speech Logical/coherent  Interaction Assertive  Motor Activity Fidgety  Appearance/Hygiene Unremarkable  Behavior Characteristics Cooperative  Mood Pleasant  Thought Process  Content WDL  Delusions None reported or observed  Perception WDL  Hallucination None reported or observed  Judgment WDL  Confusion None  Danger to Self  Current suicidal ideation? Denies  Danger to Others  Danger to Others None reported or observed

## 2022-12-19 NOTE — Group Note (Signed)
Date:  12/19/2022 Time:  10:04 AM  Group Topic/Focus:  Goals Group:   The focus of this group is to help patients establish daily goals to achieve during treatment and discuss how the patient can incorporate goal setting into their daily lives to aide in recovery. Orientation:   The focus of this group is to educate the patient on the purpose and policies of crisis stabilization and provide a format to answer questions about their admission.  The group details unit policies and expectations of patients while admitted.    Participation Level:  Active  Participation Quality:  Appropriate  Affect:  Appropriate  Cognitive:  Appropriate  Insight: Appropriate  Engagement in Group:  Engaged  Modes of Intervention:  Discussion  Additional Comments:  Pt wants to stay awake and alert.  Jaquita Rector 12/19/2022, 10:04 AM

## 2022-12-20 DIAGNOSIS — F332 Major depressive disorder, recurrent severe without psychotic features: Secondary | ICD-10-CM | POA: Diagnosis not present

## 2022-12-20 MED ORDER — TRAZODONE HCL 150 MG PO TABS: 150.0000 mg | ORAL_TABLET | Freq: Every day | ORAL | 0 refills | Status: DC

## 2022-12-20 MED ORDER — ARIPIPRAZOLE 5 MG PO TABS: 5.0000 mg | ORAL_TABLET | Freq: Every day | ORAL | 0 refills | Status: DC

## 2022-12-20 MED ORDER — NICOTINE 21 MG/24HR TD PT24: 21.0000 mg | MEDICATED_PATCH | Freq: Every day | TRANSDERMAL | 0 refills | Status: DC

## 2022-12-20 MED ORDER — BUSPIRONE HCL 10 MG PO TABS: 10.0000 mg | ORAL_TABLET | Freq: Two times a day (BID) | ORAL | 0 refills | Status: DC

## 2022-12-20 MED ORDER — LEVETIRACETAM 500 MG PO TABS: 500.0000 mg | ORAL_TABLET | Freq: Two times a day (BID) | ORAL | 0 refills | Status: DC

## 2022-12-20 MED ORDER — PANTOPRAZOLE SODIUM 40 MG PO TBEC: 40.0000 mg | DELAYED_RELEASE_TABLET | Freq: Every day | ORAL | 0 refills | Status: DC

## 2022-12-20 MED ORDER — GABAPENTIN 400 MG PO CAPS: 400.0000 mg | ORAL_CAPSULE | Freq: Three times a day (TID) | ORAL | 0 refills | Status: DC

## 2022-12-20 MED ORDER — DULOXETINE HCL 60 MG PO CPEP: 60.0000 mg | ORAL_CAPSULE | Freq: Every day | ORAL | 0 refills | Status: DC

## 2022-12-20 MED ORDER — HYDROXYZINE HCL 25 MG PO TABS: 25.0000 mg | ORAL_TABLET | Freq: Three times a day (TID) | ORAL | 0 refills | Status: DC | PRN

## 2022-12-20 NOTE — BHH Suicide Risk Assessment (Signed)
Suicide Risk Assessment  Discharge Assessment    [facility] Discharge Suicide Risk Assessment   Principal Problem: MDD (major depressive disorder), recurrent episode, severe (HCC) Discharge Diagnoses: Principal Problem:   MDD (major depressive disorder), recurrent episode, severe (HCC) Active Problems:   Alcohol use disorder, severe, dependence (HCC)   GAD (generalized anxiety disorder)   Tobacco abuse   Cocaine abuse (HCC)  Total Time spent with patient:  Greater than 30 minutes  Musculoskeletal: Strength & Muscle Tone: within normal limits Gait & Station: normal Patient leans: N/A  Psychiatric Specialty Exam  Presentation  General Appearance:  Appropriate for Environment; Casual; Fairly Groomed  Eye Contact: Good  Speech: Clear and Coherent; Normal Rate  Speech Volume: Normal  Handedness: Right   Mood and Affect  Mood: Euthymic  Duration of Depression Symptoms: Greater than two weeks  Affect: Appropriate; Congruent  Thought Process  Thought Processes: Coherent; Goal Directed; Linear  Descriptions of Associations:Intact  Orientation:Full (Time, Place and Person)  Thought Content:Logical  History of Schizophrenia/Schizoaffective disorder:No  Duration of Psychotic Symptoms:N/A  Hallucinations:Hallucinations: None  Ideas of Reference:None  Suicidal Thoughts:Suicidal Thoughts: No SI Passive Intent and/or Plan: Without Intent; Without Plan; Without Means to Carry Out; Without Access to Means  Homicidal Thoughts:Homicidal Thoughts: No  Sensorium  Memory: Immediate Good; Recent Good; Remote Good  Judgment: Good  Insight: Good  Executive Functions  Concentration: Good  Attention Span: Good  Recall: Good  Fund of Knowledge: Good  Language: Good  Psychomotor Activity  Psychomotor Activity: Psychomotor Activity: Normal  Assets  Assets: Communication Skills; Desire for Improvement; Financial Resources/Insurance; Housing; Physical  Health; Resilience; Social Support  Sleep  Sleep: Sleep: Good Number of Hours of Sleep: 7   Physical Exam: See H&P.  Blood pressure 103/78, pulse 82, temperature 97.7 F (36.5 C), temperature source Oral, resp. rate 16, height 6\' 1"  (1.854 m), weight 92.5 kg, SpO2 100 %. Body mass index is 26.91 kg/m.  Mental Status Per Nursing Assessment::   On Admission:  Suicidal ideation indicated by patient  Demographic Factors:  Male, Adolescent or young adult, Caucasian, and Unemployed  Loss Factors: Financial problems/change in socioeconomic status  Historical Factors: Impulsivity  Risk Reduction Factors:   Sense of responsibility to family, Living with another person, especially a relative, Positive social support, Positive therapeutic relationship, and Positive coping skills or problem solving skills  Continued Clinical Symptoms:  Depression:   Impulsivity Alcohol/Substance Abuse/Dependencies Previous Psychiatric Diagnoses and Treatments  Cognitive Features That Contribute To Risk:  Closed-mindedness, Loss of executive function, Polarized thinking, and Thought constriction (tunnel vision)    Suicide Risk:  Minimal: No identifiable suicidal ideation.  Patients presenting with no risk factors but with morbid ruminations; may be classified as minimal risk based on the severity of the depressive symptoms  Plan Of Care/Follow-up recommendations:  See discharge recommendation above.  [phone], NP, pmhnp, fnp-bc. 12/20/2022, 9:48 AM

## 2022-12-20 NOTE — Discharge Summary (Signed)
Physician Discharge Summary Note  Patient:  John Vaughan is an 31 y.o., male  MRN:  [id number]  DOB:  1991-01-20  Patient phone:  [phone] (home)   Patient address:   [address] [place] [address],   Total Time spent with patient:  Greater than 30 minutes  Date of Admission:  12/15/2022  Date of Discharge: 12/20/2022  Reason for Admission: Suicidal thoughts in the presence of worsening mood and substance abuse.   Principal Problem: MDD (major depressive disorder), recurrent episode, severe (HCC)  Discharge Diagnoses: Principal Problem:   MDD (major depressive disorder), recurrent episode, severe (HCC) Active Problems:   Alcohol use disorder, severe, dependence (HCC)   GAD (generalized anxiety disorder)   Tobacco abuse   Cocaine abuse (HCC)  Past Psychiatric History: Major depressive disorder, Alcohol use disorder, GAD.  Past Medical History:  Past Medical History:  Diagnosis Date   Alcohol abuse    Anxiety    Bipolar 2 disorder (HCC)    Depression    Seizures (HCC)     Past Surgical History:  Procedure Laterality Date   HAND SURGERY Right    Family History:  Family History  Problem Relation Age of Onset   Anxiety disorder Mother    Alcohol abuse Father    Anxiety disorder Maternal Aunt    Anxiety disorder Maternal Grandmother    Alcohol abuse Paternal Grandfather    Family Psychiatric  History: See H&P.  Social History:  Social History   Substance and Sexual Activity  Alcohol Use Yes   Alcohol/week: 2.0 standard drinks of alcohol   Types: 2 Cans of beer per week   Comment: 48 beers a week     Social History   Substance and Sexual Activity  Drug Use Yes   Types: Cocaine    Social History   Socioeconomic History   Marital status: Single    Spouse name: Not on file   Number of children: Not on file   Years of education: Not on file   Highest education level: Not on file  Occupational History   Not on file   Tobacco Use   Smoking status: Every Day    Packs/day: 1.50    Types: Cigarettes   Smokeless tobacco: Former    Types: Snuff   Tobacco comments:    Pt states plans to stop drinking. Will deal with tobacco cessation later.  Vaping Use   Vaping Use: Never used  Substance and Sexual Activity   Alcohol use: Yes    Alcohol/week: 2.0 standard drinks of alcohol    Types: 2 Cans of beer per week    Comment: 48 beers a week   Drug use: Yes    Types: Cocaine   Sexual activity: Yes  Other Topics Concern   Not on file  Social History Narrative   Not on file   Social Determinants of Health   Financial Resource Strain: Not on file  Food Insecurity: No Food Insecurity (12/16/2022)   Hunger Vital Sign    Worried About Running Out of Food in the Last Year: Never true    Ran Out of Food in the Last Year: Never true  Transportation Needs: No Transportation Needs (12/16/2022)   PRAPARE - Administrator, Civil Service (Medical): No    Lack of Transportation (Non-Medical): No  Physical Activity: Not on file  Stress: Not on file  Social Connections: Not on file   HOSPITAL COURSE: (Per admission evaluation notes): 31 year old, Caucasian  male with a past psychiatric history significant for alcohol abuse, cocaine abuse, substance-induced mood disorder, generalized anxiety disorder, and major depressive disorder who was admitted to [facility] from [facility] ED due to suicidal thoughts in the presence of worsening mood and substance abuse.  Patient previously presented to [facility] ED due to struggles with polysubstance abuse.  Patient reported that he had been drinking heavily for the last 2 weeks as well as using crack cocaine intermittently for the last few days.  He reported that he drank alcohol earlier that day equating to 4-5 x 40oz. patient reported having intermittent hallucinations as well as nausea and vomiting for the last 24 to 48 hours.    Prior to this discharge, John Vaughan was seen & evaluated for mental health stability. The current laboratory findings were reviewed (stable), nurses notes & vital signs were reviewed as well. There are no current mental health or medical issues that should prevent this discharge at this time. Patient is being discharged to continue mental health issues as noted below.  This is one of several psychiatric admission/discharge summaries from this [facility] for this 31 year old Caucasian male with hx of chronic mental illness, polysubstance use disorders & multiple psychiatric admissions. He is known in this [facility] for worsening symptoms usually related to substance relapse or intoxication. John Vaughan has been tried on multiple psychotropic medications for his symptoms, however, he is known to be non-compliant to his treatment regimen pre-disposing him to relapses/recurrent of symptoms & frequent hospitalizations. He was brought to the [facility] this time around for evaluation & treatment for worsening suicidal thoughts in the presence of worsening mood and substance abuse.   After evaluation of his presenting symptoms this time around, John Vaughan was recommended for mood stabilization treatments. The medication regimen for his presenting symptoms were discussed & with his consent initiated. He received, stabilized & was discharged on the medications as listed below on his discharge medication lists. He was also enrolled & participated in the group counseling sessions being offered & held on this unit. He learned coping skills. He presented on this admission, other chronic medical conditions that required treatment & monitoring. He was resumed & discharged on his home medication for those health issues. He tolerated his treatment regimen without any adverse effects or reactions reported.  During the course of his hospitalization, the 15-minute checks were adequate to ensure Patient's safety.  Patient did not display any dangerous, violent  or suicidal behavior on the unit. He interacted with patients & staff appropriately. He participated appropriately in the group sessions/therapies. His medications were addressed & adjusted to meet his needs. He was recommended for outpatient follow-up care & medication management upon discharge to assure his continuity of care.  At the time of discharge, patient is not reporting any acute suicidal/homicidal ideations. He currently denies any new issues or concerns. Education and supportive counseling provided throughout her hospital stay & upon discharge.  Today upon his discharge evaluation with the attending psychiatrist, Keenen presents mentally & medically stable. He denies any other specific concerns. He is sleeping well. His appetite is good. He denies other physical complaints. He denies AH/VH, delusional thoughts or paranoia. He feels that his medications have been helpful & is in agreement to continue his current treatment regimen as recommended. He was able to engage in safety planning including plan to return to [facility] or contact emergency services if he feels unable to maintain his own safety or the safety of others. Pt  had no further questions, comments, or concerns. He left [facility] with all personal belongings in no apparent distress. Transportation per his friend.  Physical Findings: AIMS: Facial and Oral Movements Muscles of Facial Expression: None, normal Lips and Perioral Area: None, normal Jaw: None, normal Tongue: None, normal,Extremity Movements Upper (arms, wrists, hands, fingers): None, normal Lower (legs, knees, ankles, toes): None, normal, Trunk Movements Neck, shoulders, hips: None, normal, Overall Severity Severity of abnormal movements (highest score from questions above): None, normal Incapacitation due to abnormal movements: None, normal Patient's awareness of abnormal movements (rate only patient's report): No Awareness, Dental Status Current problems with teeth and/or  dentures?: No Does patient usually wear dentures?: No  CIWA:  CIWA-Ar Total: 0 COWS:   n/a  Musculoskeletal: Strength & Muscle Tone: within normal limits Gait & Station: normal Patient leans: N/A  Psychiatric Specialty Exam:  Presentation  General Appearance: Appropriate for Environment; Casual; Fairly Groomed  Eye Contact:Good  Speech:Clear and Coherent; Normal Rate  Speech Volume:Normal  Handedness:Right  Mood and Affect  Mood:Euthymic  Affect:Appropriate; Congruent  Thought Process  Thought Processes:Coherent; Goal Directed; Linear  Descriptions of Associations:Intact  Orientation:Full (Time, Place and Person)  Thought Content:Logical  History of Schizophrenia/Schizoaffective disorder:No  Duration of Psychotic Symptoms:N/A  Hallucinations:Hallucinations: None   Ideas of Reference:None  Suicidal Thoughts:Suicidal Thoughts: No SI Passive Intent and/or Plan: Without Intent; Without Plan; Without Means to Carry Out; Without Access to Means   Homicidal Thoughts:Homicidal Thoughts: No   Sensorium  Memory:Immediate Good; Recent Good; Remote Good  Judgment:Good  Insight:Good  Executive Functions  Concentration:Good  Attention Span:Good  Recall:Good  Fund of Knowledge:Good  Language:Good  Psychomotor Activity  Psychomotor Activity:Psychomotor Activity: Normal   Assets  Assets:Communication Skills; Desire for Improvement; Financial Resources/Insurance; Housing; Physical Health; Resilience; Social Support  Sleep  Sleep:Sleep: Good Number of Hours of Sleep: 7   Physical Exam: Physical Exam Constitutional:      Appearance: Normal appearance.  HENT:     Head: Normocephalic.     Nose: Nose normal. No congestion.     Mouth/Throat:     Pharynx: Oropharynx is clear.  Eyes:     Pupils: Pupils are equal, round, and reactive to light.  Cardiovascular:     Rate and Rhythm: Normal rate.     Pulses: Normal pulses.  Pulmonary:     Effort:  Pulmonary effort is normal.  Genitourinary:    Comments: Deferred Musculoskeletal:        General: Normal range of motion.     Cervical back: Normal range of motion.  Skin:    General: Skin is warm and dry.  Neurological:     General: No focal deficit present.     Mental Status: He is alert and oriented to person, place, and time. Mental status is at baseline.    Review of Systems  Constitutional:  Negative for chills, diaphoresis and fever.  HENT:  Negative for congestion and sore throat.   Eyes:  Negative for blurred vision.  Respiratory:  Negative for cough, shortness of breath and wheezing.   Cardiovascular:  Negative for chest pain and palpitations.  Gastrointestinal:  Negative for abdominal pain, constipation, diarrhea, heartburn, nausea and vomiting.  Genitourinary:  Negative for dysuria.  Musculoskeletal:  Negative for joint pain and myalgias.  Skin:  Negative for itching and rash.  Neurological:  Negative for dizziness, tingling, tremors, sensory change, speech change, focal weakness, seizures, loss of consciousness, weakness and headaches.  Endo/Heme/Allergies:        NKDA  Psychiatric/Behavioral:  Positive for depression (Hx of (stable on medications).) and substance abuse (Hx alcoholism (stable)). Negative for hallucinations, memory loss and suicidal ideas. The patient has insomnia (Hx. (stable on medication).). The patient is not nervous/anxious (Stable upon discharge).    Blood pressure 103/78, pulse 82, temperature 97.7 F (36.5 C), temperature source Oral, resp. rate 16, height 6\' 1"  (1.854 m), weight 92.5 kg, SpO2 100 %. Body mass index is 26.91 kg/m.  Social History   Tobacco Use  Smoking Status Every Day   Packs/day: 1.50   Types: Cigarettes  Smokeless Tobacco Former   Types: Snuff  Tobacco Comments   Pt states plans to stop drinking. Will deal with tobacco cessation later.   Tobacco Cessation:  An FDA-approved tobacco cessation medication recommended at  discharge  Blood Alcohol level:  Lab Results  Component Value Date   ETH 281 (H) 12/15/2022   ETH 346 (HH) 09/19/2022   Metabolic Disorder Labs:  Lab Results  Component Value Date   HGBA1C 5.4 05/14/2022   MPG 108.28 05/14/2022   MPG 102.54 10/25/2021   No results found for: "PROLACTIN" Lab Results  Component Value Date   CHOL 196 05/14/2022   TRIG 93 05/14/2022   HDL 57 05/14/2022   CHOLHDL 3.4 05/14/2022   VLDL 19 05/14/2022   LDLCALC 120 (H) 05/14/2022   LDLCALC 93 10/25/2021   See Psychiatric Specialty Exam and Suicide Risk Assessment completed by Attending Physician prior to discharge.  Discharge destination:  Home  Is patient on multiple antipsychotic therapies at discharge:  No   Has Patient had three or more failed trials of antipsychotic monotherapy by history:  No  Recommended Plan for Multiple Antipsychotic Therapies: NA  Allergies as of 12/20/2022   No Known Allergies      Medication List     STOP taking these medications    cyanocobalamin 1000 MCG tablet   mirtazapine 7.5 MG tablet Commonly known as: REMERON       TAKE these medications      Indication  ARIPiprazole 5 MG tablet Commonly known as: ABILIFY Take 1 tablet (5 mg total) by mouth daily. For mood stabilization Start taking on: December 21, 2022  Indication: Mood stabilization   busPIRone 10 MG tablet Commonly known as: BUSPAR Take 1 tablet (10 mg total) by mouth 2 (two) times daily. For anxiety What changed:  medication strength how much to take additional instructions  Indication: Anxiety Disorder   DULoxetine 60 MG capsule Commonly known as: CYMBALTA Take 1 capsule (60 mg total) by mouth daily. For depression Start taking on: December 21, 2022 What changed: additional instructions  Indication: Major Depressive Disorder   gabapentin 400 MG capsule Commonly known as: NEURONTIN Take 1 capsule (400 mg total) by mouth 3 (three) times daily. For agitation What  changed: additional instructions  Indication: Agitation   hydrOXYzine 25 MG tablet Commonly known as: ATARAX Take 1 tablet (25 mg total) by mouth 3 (three) times daily as needed for anxiety (Sleep). What changed:  medication strength how much to take reasons to take this  Indication: Feeling Anxious, Insomnia   levETIRAcetam 500 MG tablet Commonly known as: KEPPRA Take 1 tablet (500 mg total) by mouth 2 (two) times daily. For seizures What changed: additional instructions  Indication: Seizure   nicotine 21 mg/24hr patch Commonly known as: NICODERM CQ - dosed in mg/24 hours Place 1 patch (21 mg total) onto the skin daily. (May buy from over the counter): For smoking cessation Start taking  on: December 21, 2022  Indication: Nicotine Addiction   pantoprazole 40 MG tablet Commonly known as: PROTONIX Take 1 tablet (40 mg total) by mouth daily. For acid reflux What changed: additional instructions  Indication: Gastroesophageal Reflux Disease   traZODone 150 MG tablet Commonly known as: DESYREL Take 1 tablet (150 mg total) by mouth at bedtime. For sleep What changed:  medication strength how much to take when to take this reasons to take this additional instructions  Indication: Trouble Sleeping          Follow-up recommendations: Activity:  As tolerated Diet: As recommended by your primary care doctor. Keep all scheduled follow-up appointments as recommended.   Comments: Comments: Patient is recommended to follow-up care on an outpatient basis as noted above. Prescriptions sent to pt's pharmacy of choice at discharge.   Patient agreeable to plan.   Given opportunity to ask questions.   Appears to feel comfortable with discharge denies any current suicidal or homicidal thought. Patient is also instructed prior to discharge to: Take all medications as prescribed by his/her mental healthcare provider. Report any adverse effects and or reactions from the medicines to  his/her outpatient provider promptly. Patient has been instructed & cautioned: To not engage in alcohol and or illegal drug use while on prescription medicines. In the event of worsening symptoms, patient is instructed to call the crisis hotline, 911 and or go to the nearest ED for appropriate evaluation and treatment of symptoms. To follow-up with his/her primary care provider for your other medical issues, concerns and or health care needs.   Signed: Armandina Stammer, NP, pmhnp, fnp-bc 12/20/2022, 9:47 AM

## 2022-12-20 NOTE — Progress Notes (Addendum)
Patient is discharging at this time. Patient is A&Ox4. Vs stable. Patient denies SI,HI, and A/V/H with no plan/intent. Printed AVS reviewed with and given to patient along with medications and follow up appointments. Patient verbalized all understanding. All valuables/belongings returned to patient. Patient is being transported by his friend, Ivar Drape. Patient denies any pain/discomfort. No s/s of current distress.

## 2022-12-20 NOTE — Progress Notes (Signed)
   12/20/22 0615  15 Minute Checks  Location Medication Room/Window  Visual Appearance Calm  Behavior Composed  Sleep (Behavioral Health Patients Only)  Calculate sleep? (Click Yes once per 24 hr at 0600 safety check) Yes  Documented sleep last 24 hours 7

## 2022-12-20 NOTE — Group Note (Signed)
Date:  12/20/2022 Time:  10:14 AM  Group Topic/Focus:  Goals Group:   The focus of this group is to help patients establish daily goals to achieve during treatment and discuss how the patient can incorporate goal setting into their daily lives to aide in recovery.    Participation Level:  Active  Participation Quality:  Appropriate  Affect:  Appropriate  Cognitive:  Appropriate  Insight: Appropriate  Engagement in Group:  Engaged  Modes of Intervention:  Discussion  Additional Comments:     Reymundo Poll 12/20/2022, 10:14 AM

## 2022-12-20 NOTE — Progress Notes (Signed)
  [facility] Adult Case Management Discharge Plan :  Will you be returning to the same living situation after discharge:  Yes,  With a Friend At discharge, do you have transportation home?: Yes,  Friend Do you have the ability to pay for your medications: Yes,     Release of information consent forms completed and in the chart;  Patient's signature needed at discharge.  Patient to Follow up at:  Follow-up Information     [facility] Follow up.   Specialty: Urgent Care Contact information: [address] [place] [address] [phone]        [facility] Schedule an appointment as soon as possible for a visit in 3 day(s).   Contact information: [address] [place] [address] [phone]                 Next level of care provider has access to [facility] Link:yes  Safety Planning and Suicide Prevention discussed:      Has patient been referred to the Quitline?: Patient refused referral  Patient has been referred for addiction treatment: Yes Patient to continue working towards treatment goals after discharge. Patient no longer meets criteria for inpatient criteria per attending physician. Continue taking medications as prescribed, nursing to provide instructions at discharge. Follow up with all scheduled appointments.   Iracema Lanagan S Elner Seifert, LCSW 12/20/2022, 11:56 AM

## 2023-01-20 ENCOUNTER — Emergency Department (HOSPITAL_COMMUNITY)
Admission: EM | Admit: 2023-01-20 | Discharge: 2023-01-20 | Disposition: A | Discharge status: Home / Self Care | Payer: Self-pay | Attending: Emergency Medicine | Admitting: Emergency Medicine

## 2023-01-20 ENCOUNTER — Encounter (HOSPITAL_COMMUNITY): Payer: Self-pay

## 2023-01-20 DIAGNOSIS — R519 Headache, unspecified: Secondary | ICD-10-CM | POA: Insufficient documentation

## 2023-01-20 DIAGNOSIS — F1910 Other psychoactive substance abuse, uncomplicated: Secondary | ICD-10-CM

## 2023-01-20 DIAGNOSIS — Y908 Blood alcohol level of 240 mg/100 ml or more: Secondary | ICD-10-CM | POA: Insufficient documentation

## 2023-01-20 DIAGNOSIS — F1010 Alcohol abuse, uncomplicated: Secondary | ICD-10-CM | POA: Insufficient documentation

## 2023-01-20 DIAGNOSIS — F1410 Cocaine abuse, uncomplicated: Secondary | ICD-10-CM | POA: Insufficient documentation

## 2023-01-20 LAB — COMPREHENSIVE METABOLIC PANEL
ALT: 24 U/L (ref 0–44)
AST: 29 U/L (ref 15–41)
Albumin: 4 g/dL (ref 3.5–5.0)
Alkaline Phosphatase: 55 U/L (ref 38–126)
Anion gap: 8 (ref 5–15)
BUN: 14 mg/dL (ref 6–20)
CO2: 24 mmol/L (ref 22–32)
Calcium: 8.5 mg/dL — ABNORMAL LOW (ref 8.9–10.3)
Chloride: 109 mmol/L (ref 98–111)
Creatinine, Ser: 0.75 mg/dL (ref 0.61–1.24)
GFR, Estimated: >60 mL/min (ref >60)
Glucose, Bld: 96 mg/dL (ref 70–99)
Potassium: 4 mmol/L (ref 3.5–5.1)
Sodium: 141 mmol/L (ref 135–145)
Total Bilirubin: 0.3 mg/dL (ref 0.3–1.2)
Total Protein: 7.3 g/dL (ref 6.5–8.1)

## 2023-01-20 LAB — RAPID URINE DRUG SCREEN, HOSP PERFORMED
Amphetamines: NOT DETECTED
Barbiturates: NOT DETECTED
Benzodiazepines: NOT DETECTED
Cocaine: POSITIVE — AB
Opiates: NOT DETECTED
Tetrahydrocannabinol: NOT DETECTED

## 2023-01-20 LAB — CBC WITH DIFFERENTIAL/PLATELET
Abs Immature Granulocytes: 0.02 10*3/uL (ref 0.00–0.07)
Basophils Absolute: 0 10*3/uL (ref 0.0–0.1)
Basophils Relative: 1 %
Eosinophils Absolute: 0.2 10*3/uL (ref 0.0–0.5)
Eosinophils Relative: 3 %
HCT: 42.7 % (ref 39.0–52.0)
Hemoglobin: 14.6 g/dL (ref 13.0–17.0)
Immature Granulocytes: 0 %
Lymphocytes Relative: 38 %
Lymphs Abs: 1.9 10*3/uL (ref 0.7–4.0)
MCH: 31.6 pg (ref 26.0–34.0)
MCHC: 34.2 g/dL (ref 30.0–36.0)
MCV: 92.4 fL (ref 80.0–100.0)
Monocytes Absolute: 0.6 10*3/uL (ref 0.1–1.0)
Monocytes Relative: 12 %
Neutro Abs: 2.3 10*3/uL (ref 1.7–7.7)
Neutrophils Relative %: 46 %
Platelets: 303 10*3/uL (ref 150–400)
RBC: 4.62 MIL/uL (ref 4.22–5.81)
RDW: 12.8 % (ref 11.5–15.5)
WBC: 5.1 10*3/uL (ref 4.0–10.5)
nRBC: 0 % (ref 0.0–0.2)

## 2023-01-20 LAB — ETHANOL: Alcohol, Ethyl (B): 294 mg/dL — ABNORMAL HIGH (ref <10)

## 2023-01-20 MED ORDER — LEVETIRACETAM IN NACL 1000 MG/100ML IV SOLN
1000.0000 mg | Freq: Once | INTRAVENOUS | Status: AC
  Administered 2023-01-20: 1000 mg via INTRAVENOUS
  Filled 2023-01-20: qty 100

## 2023-01-20 MED ORDER — SODIUM CHLORIDE 0.9 % IV BOLUS
1000.0000 mL | Freq: Once | INTRAVENOUS | Status: AC
  Administered 2023-01-20: 1000 mL via INTRAVENOUS

## 2023-01-20 MED ORDER — ACETAMINOPHEN 500 MG PO TABS
1000.0000 mg | ORAL_TABLET | Freq: Once | ORAL | Status: AC
  Administered 2023-01-20: 1000 mg via ORAL
  Filled 2023-01-20: qty 2

## 2023-01-20 NOTE — Discharge Instructions (Signed)
Return for any problem.  ?

## 2023-01-20 NOTE — ED Triage Notes (Signed)
Pt arrived via EMS, from home, hx of alcohol abuse has not been drinking for over a month. Recent stressors lead to binge drinking this weekend. Last drink last night.  Hx of seizures, takes [place] for same.  Missed dose last night and this morning. States feels like he's going to have a seizure  EMS states pt has intermittent episodes of confusion en route.   20 L AC 4 mg zofran 500 cc NS
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
## 2023-03-25 ENCOUNTER — Other Ambulatory Visit: Payer: Self-pay

## 2023-03-25 ENCOUNTER — Encounter (HOSPITAL_COMMUNITY): Payer: Self-pay

## 2023-03-25 ENCOUNTER — Emergency Department (HOSPITAL_COMMUNITY)
Admission: EM | Admit: 2023-03-25 | Discharge: 2023-03-26 | Disposition: A | Discharge status: Other Acute Inpatient Hospital | Payer: Medicaid Other | Attending: Emergency Medicine | Admitting: Emergency Medicine

## 2023-03-25 ENCOUNTER — Emergency Department (HOSPITAL_COMMUNITY): Payer: Medicaid Other

## 2023-03-25 DIAGNOSIS — F1910 Other psychoactive substance abuse, uncomplicated: Secondary | ICD-10-CM | POA: Diagnosis not present

## 2023-03-25 DIAGNOSIS — R109 Unspecified abdominal pain: Secondary | ICD-10-CM | POA: Insufficient documentation

## 2023-03-25 DIAGNOSIS — F339 Major depressive disorder, recurrent, unspecified: Secondary | ICD-10-CM | POA: Insufficient documentation

## 2023-03-25 DIAGNOSIS — F1410 Cocaine abuse, uncomplicated: Secondary | ICD-10-CM | POA: Insufficient documentation

## 2023-03-25 DIAGNOSIS — Z20822 Contact with and (suspected) exposure to covid-19: Secondary | ICD-10-CM | POA: Insufficient documentation

## 2023-03-25 LAB — COMPREHENSIVE METABOLIC PANEL
ALT: 32 U/L (ref 0–44)
AST: 35 U/L (ref 15–41)
Albumin: 4.4 g/dL (ref 3.5–5.0)
Alkaline Phosphatase: 64 U/L (ref 38–126)
Anion gap: 15 (ref 5–15)
BUN: 8 mg/dL (ref 6–20)
CO2: 23 mmol/L (ref 22–32)
Calcium: 10.1 mg/dL (ref 8.9–10.3)
Chloride: 101 mmol/L (ref 98–111)
Creatinine, Ser: 1.09 mg/dL (ref 0.61–1.24)
GFR, Estimated: >60 mL/min (ref >60)
Glucose, Bld: 95 mg/dL (ref 70–99)
Potassium: 3.9 mmol/L (ref 3.5–5.1)
Sodium: 139 mmol/L (ref 135–145)
Total Bilirubin: 1.5 mg/dL — ABNORMAL HIGH (ref 0.3–1.2)
Total Protein: 7.8 g/dL (ref 6.5–8.1)

## 2023-03-25 LAB — RAPID URINE DRUG SCREEN, HOSP PERFORMED
Amphetamines: NOT DETECTED
Barbiturates: NOT DETECTED
Benzodiazepines: POSITIVE — AB
Cocaine: POSITIVE — AB
Opiates: NOT DETECTED
Tetrahydrocannabinol: NOT DETECTED

## 2023-03-25 LAB — CBC
HCT: 47 % (ref 39.0–52.0)
Hemoglobin: 16.6 g/dL (ref 13.0–17.0)
MCH: 32.7 pg (ref 26.0–34.0)
MCHC: 35.3 g/dL (ref 30.0–36.0)
MCV: 92.7 fL (ref 80.0–100.0)
Platelets: 231 10*3/uL (ref 150–400)
RBC: 5.07 MIL/uL (ref 4.22–5.81)
RDW: 12.5 % (ref 11.5–15.5)
WBC: 4.4 10*3/uL (ref 4.0–10.5)
nRBC: 0 % (ref 0.0–0.2)

## 2023-03-25 LAB — URINALYSIS, ROUTINE W REFLEX MICROSCOPIC
Bilirubin Urine: NEGATIVE
Glucose, UA: NEGATIVE mg/dL
Hgb urine dipstick: NEGATIVE
Ketones, ur: NEGATIVE mg/dL
Leukocytes,Ua: NEGATIVE
Nitrite: NEGATIVE
Protein, ur: NEGATIVE mg/dL
Specific Gravity, Urine: >1.046 — ABNORMAL HIGH (ref 1.005–1.030)
pH: 6 (ref 5.0–8.0)

## 2023-03-25 LAB — TROPONIN I (HIGH SENSITIVITY)
Troponin I (High Sensitivity): <2 ng/L (ref <18)
Troponin I (High Sensitivity): <2 ng/L (ref <18)

## 2023-03-25 LAB — ETHANOL: Alcohol, Ethyl (B): <10 mg/dL (ref <10)

## 2023-03-25 LAB — ACETAMINOPHEN LEVEL: Acetaminophen (Tylenol), Serum: <10 ug/mL — ABNORMAL LOW (ref 10–30)

## 2023-03-25 LAB — RESP PANEL BY RT-PCR (RSV, FLU A&B, COVID)  RVPGX2
Influenza A by PCR: NEGATIVE
Influenza B by PCR: NEGATIVE
Resp Syncytial Virus by PCR: NEGATIVE
SARS Coronavirus 2 by RT PCR: NEGATIVE

## 2023-03-25 LAB — VITAMIN B12: Vitamin B-12: 415 pg/mL (ref 180–914)

## 2023-03-25 LAB — LIPASE, BLOOD: Lipase: 33 U/L (ref 11–51)

## 2023-03-25 LAB — SALICYLATE LEVEL: Salicylate Lvl: <7 mg/dL — ABNORMAL LOW (ref 7.0–30.0)

## 2023-03-25 MED ORDER — HYDROXYZINE HCL 25 MG PO TABS: 25.0000 mg | ORAL_TABLET | Freq: Four times a day (QID) | ORAL | Status: DC | PRN

## 2023-03-25 MED ORDER — DULOXETINE HCL 60 MG PO CPEP
60.0000 mg | ORAL_CAPSULE | Freq: Every day | ORAL | Status: DC
  Administered 2023-03-25 – 2023-03-26 (×2): 60 mg via ORAL
  Filled 2023-03-25 (×2): qty 1

## 2023-03-25 MED ORDER — THIAMINE MONONITRATE 100 MG PO TABS
100.0000 mg | ORAL_TABLET | Freq: Every day | ORAL | Status: DC
  Administered 2023-03-25 – 2023-03-26 (×2): 100 mg via ORAL
  Filled 2023-03-25 (×2): qty 1

## 2023-03-25 MED ORDER — PANTOPRAZOLE SODIUM 40 MG PO TBEC
40.0000 mg | DELAYED_RELEASE_TABLET | Freq: Every day | ORAL | Status: DC
  Administered 2023-03-25 – 2023-03-26 (×2): 40 mg via ORAL
  Filled 2023-03-25 (×2): qty 1

## 2023-03-25 MED ORDER — CHLORDIAZEPOXIDE HCL 25 MG PO CAPS
25.0000 mg | ORAL_CAPSULE | Freq: Four times a day (QID) | ORAL | Status: DC | PRN
  Administered 2023-03-25: 25 mg via ORAL
  Filled 2023-03-25: qty 1

## 2023-03-25 MED ORDER — ONDANSETRON 4 MG PO TBDP: 4.0000 mg | ORAL_TABLET | Freq: Four times a day (QID) | ORAL | Status: DC | PRN

## 2023-03-25 MED ORDER — THIAMINE HCL 100 MG/ML IJ SOLN: 100.0000 mg | Freq: Once | INTRAMUSCULAR | Status: DC

## 2023-03-25 MED ORDER — IOHEXOL 350 MG/ML SOLN
75.0000 mL | Freq: Once | INTRAVENOUS | Status: AC | PRN
  Administered 2023-03-25: 75 mL via INTRAVENOUS

## 2023-03-25 MED ORDER — NICOTINE 21 MG/24HR TD PT24
21.0000 mg | MEDICATED_PATCH | Freq: Every day | TRANSDERMAL | Status: DC
  Administered 2023-03-25 – 2023-03-26 (×2): 21 mg via TRANSDERMAL
  Filled 2023-03-25 (×2): qty 1

## 2023-03-25 MED ORDER — LEVETIRACETAM 500 MG PO TABS
500.0000 mg | ORAL_TABLET | Freq: Two times a day (BID) | ORAL | Status: DC
  Administered 2023-03-25 – 2023-03-26 (×2): 500 mg via ORAL
  Filled 2023-03-25 (×2): qty 1

## 2023-03-25 MED ORDER — ONDANSETRON HCL 4 MG/2ML IJ SOLN
4.0000 mg | Freq: Once | INTRAMUSCULAR | Status: AC
  Administered 2023-03-25: 4 mg via INTRAVENOUS
  Filled 2023-03-25: qty 2

## 2023-03-25 MED ORDER — LOPERAMIDE HCL 2 MG PO CAPS
2.0000 mg | ORAL_CAPSULE | ORAL | Status: DC | PRN
  Administered 2023-03-25: 4 mg via ORAL
  Filled 2023-03-25: qty 2

## 2023-03-25 MED ORDER — LACTATED RINGERS IV BOLUS
2000.0000 mL | Freq: Once | INTRAVENOUS | Status: AC
  Administered 2023-03-25: 2000 mL via INTRAVENOUS

## 2023-03-25 MED ORDER — LEVETIRACETAM IN NACL 1000 MG/100ML IV SOLN
1000.0000 mg | Freq: Once | INTRAVENOUS | Status: AC
  Administered 2023-03-25: 1000 mg via INTRAVENOUS
  Filled 2023-03-25: qty 100

## 2023-03-25 MED ORDER — CHLORDIAZEPOXIDE HCL 25 MG PO CAPS
50.0000 mg | ORAL_CAPSULE | Freq: Once | ORAL | Status: AC
  Administered 2023-03-25: 50 mg via ORAL
  Filled 2023-03-25: qty 2

## 2023-03-25 MED ORDER — ADULT MULTIVITAMIN W/MINERALS CH
1.0000 | ORAL_TABLET | Freq: Every day | ORAL | Status: DC
  Administered 2023-03-25 – 2023-03-26 (×2): 1 via ORAL
  Filled 2023-03-25 (×2): qty 1

## 2023-03-25 MED ORDER — BUSPIRONE HCL 10 MG PO TABS
10.0000 mg | ORAL_TABLET | Freq: Two times a day (BID) | ORAL | Status: DC
  Administered 2023-03-25 – 2023-03-26 (×2): 10 mg via ORAL
  Filled 2023-03-25 (×2): qty 1

## 2023-03-25 MED ORDER — GABAPENTIN 400 MG PO CAPS
400.0000 mg | ORAL_CAPSULE | Freq: Three times a day (TID) | ORAL | Status: DC
  Administered 2023-03-25 – 2023-03-26 (×3): 400 mg via ORAL
  Filled 2023-03-25 (×3): qty 1

## 2023-03-25 MED ORDER — ARIPIPRAZOLE 10 MG PO TABS
5.0000 mg | ORAL_TABLET | Freq: Every day | ORAL | Status: DC
  Administered 2023-03-25 – 2023-03-26 (×2): 5 mg via ORAL
  Filled 2023-03-25 (×2): qty 1
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
## 2023-03-25 NOTE — ED Notes (Signed)
TTS being complete at this time. 

## 2023-03-25 NOTE — ED Notes (Signed)
Pt received for care at 1900.  Quietly resting in bed; respirations even and unlabored with no distress noted.  Pt in view of sitter with necessary precautions maintained.

## 2023-03-25 NOTE — Discharge Instructions (Addendum)
A refill for your Keppra prescription was sent to pharmacy.  This is low cost medication.  It will cost you $9 a month at [facility].  A separate prescription for a Librium taper was also sent.  This is a medication to avoid alcohol withdrawal.  If you do end up drinking alcohol again, stop using this medication.  Return to emergency department for any worsening symptoms of concern.

## 2023-03-25 NOTE — ED Provider Triage Note (Cosign Needed Addendum)
Emergency Medicine Provider Triage Evaluation Note  John Vaughan , a 32 y.o. male  was evaluated in triage.  Pt complains of multiple complaints, endorses fever, body aches since mother came home from possible pneumonia for 2 days, reports feet have been tingling.  Reports that he is having multiple episodes of passing out.  Patient reports that he feels a strong thump in his heart prior to having syncopal episode.  He cannot tell me how long he is unconscious for.  Patient endorses history of alcohol abuse, at least 8-10 drinks a day, reports that he last drank on Sunday morning.  Reports last cocaine use was on Friday.  He denies any other drug use at this time.  Reports that he is been having hallucinations.  He has been having nausea, vomiting, diarrhea, abdominal pain.  He denies any chest pain, evaluation..  Review of Systems  Positive: Syncope, feet tingling, alcohol, drug abuse, nvd, bodyaches Negative: Chest pain  Physical Exam  BP (!) 113/93 (BP Location: Right Arm)   Pulse 94   Temp 97.9 F (36.6 C)   Resp 19   Ht 6\' 1"  (1.854 m)   Wt 92.5 kg   SpO2 95%   BMI 26.91 kg/m  Gen:   Awake, no distress   Resp:  Normal effort  MSK:   Moves extremities without difficulty  Other:  Moves all 4 limbs spontaneously, CN II through XII grossly intact, can ambulate without difficulty, intact sensation throughout.  Medical Decision Making  Medically screening exam initiated at 12:51 PM.  Appropriate orders placed.  John Vaughan was informed that the remainder of the evaluation will be completed by another provider, this initial triage assessment does not replace that evaluation, and the importance of remaining in the ED until their evaluation is complete.  Workup initiated in triage    John Vaughan 03/25/23 1252  After I finished with my assessment patient endorsed passive suicidal ideation to the nurse, he denies current plan but has had suicide attempt  in the past.   John Pickler, PA-C 03/25/23 1256

## 2023-03-25 NOTE — ED Triage Notes (Addendum)
Reports n/v fever and bodyaches since mother came home from hospital with pna.  Also complains of feet being tingling.  Reports he keeps passing out last time this morning. Patient has multiple complaints. Reports hallucinating x 2 days and hx of alcohol abuse and last drink was Sunday.  Reports suicidal with no specific plan.

## 2023-03-25 NOTE — ED Notes (Signed)
Pt is awake, a&ox4, pwd. Pt states that he came in today because he has been feeling sick for one week with not being able to eat, n/v/d. Pt also states that he has not urinated for a week as well. Complains of left lower abdominal pain that radiates to his left flank and left lower back. Pt also states that he has had a lot going on in his life and has had thoughts of self harm. He has no plan and has not attempted to hurt himself. Pt is changed out, sitter present.

## 2023-03-25 NOTE — ED Notes (Signed)
Took over pt care at this time 

## 2023-03-25 NOTE — ED Provider Notes (Addendum)
[facility] Provider Note   CSN: [id number] Arrival date & time: 03/25/23  1213     History  Chief Complaint  Patient presents with   Suicidal   Withdrawal    John Vaughan is a 32 y.o. male.  HPI Patient presents for multiple complaints.  Medical history includes anxiety, alcohol abuse, depression, benzodiazepine dependence, drug withdrawal seizure, bipolar disorder.  Recent symptoms include syncope, feet tingling, palpitations, hallucinations, nausea, vomiting, diarrhea, abdominal pain fever, body aches, decreased urine output.  He has had poor appetite and very little food intake over the past week.  He has a history of alcohol abuse and has been drinking  three 40 ounce bottles of malt liquor daily.  This was up until yesterday morning.  He has had nausea, vomiting, and p.o. intolerance over the past 2 days.  Today he was experiencing visual and auditory hallucinations.  He has had passive suicidal ideation that he attributes to recent psychosocial stressors.  His mother is currently in the hospital.  He is in financial difficulties.  Family will likely lose their home.  He has been off of his home medications, including his Keppra for the past month.  In addition to alcohol use, patient smokes cocaine occasionally.  Last cocaine use was 3 days ago.  Currently, patient has upper abdominal pain that radiates to back.  He does endorse current nausea.      Home Medications Prior to Admission medications   Medication Sig Start Date End Date Taking? Authorizing Provider  ARIPiprazole (ABILIFY) 5 MG tablet Take 1 tablet (5 mg total) by mouth daily. For mood stabilization 12/21/22   Lindell Spar I, NP  busPIRone (BUSPAR) 10 MG tablet Take 1 tablet (10 mg total) by mouth 2 (two) times daily. For anxiety 12/20/22   Lindell Spar I, NP  DULoxetine (CYMBALTA) 60 MG capsule Take 1 capsule (60 mg total) by mouth daily. For depression 12/21/22    Lindell Spar I, NP  gabapentin (NEURONTIN) 400 MG capsule Take 1 capsule (400 mg total) by mouth 3 (three) times daily. For agitation 12/20/22   Lindell Spar I, NP  hydrOXYzine (ATARAX) 25 MG tablet Take 1 tablet (25 mg total) by mouth 3 (three) times daily as needed for anxiety (Sleep). 12/20/22   Lindell Spar I, NP  levETIRAcetam (KEPPRA) 500 MG tablet Take 1 tablet (500 mg total) by mouth 2 (two) times daily. For seizures 12/20/22   Lindell Spar I, NP  nicotine (NICODERM CQ - DOSED IN MG/24 HOURS) 21 mg/24hr patch Place 1 patch (21 mg total) onto the skin daily. (May buy from over the counter): For smoking cessation 12/21/22   Lindell Spar I, NP  pantoprazole (PROTONIX) 40 MG tablet Take 1 tablet (40 mg total) by mouth daily. For acid reflux 12/20/22   Lindell Spar I, NP  traZODone (DESYREL) 150 MG tablet Take 1 tablet (150 mg total) by mouth at bedtime. For sleep 12/20/22   Lindell Spar I, NP      Allergies    Patient has no known allergies.    Review of Systems   Review of Systems  Constitutional:  Positive for activity change, appetite change, fatigue and fever.  Gastrointestinal:  Positive for abdominal pain, diarrhea, nausea and vomiting.  Genitourinary:  Positive for decreased urine volume.  Musculoskeletal:  Positive for myalgias.  Psychiatric/Behavioral:  Positive for hallucinations and suicidal ideas.     Physical Exam Updated Vital Signs BP 114/78   Pulse  84   Temp 97.9 F (36.6 C) (Oral)   Resp 12   Ht 6\' 1"  (1.854 m)   Wt 92.5 kg   SpO2 98%   BMI 26.91 kg/m  Physical Exam Vitals and nursing note reviewed.  Constitutional:      General: He is not in acute distress.    Appearance: Normal appearance. He is well-developed. He is not ill-appearing, toxic-appearing or diaphoretic.  HENT:     Head: Normocephalic and atraumatic.     Right Ear: External ear normal.     Left Ear: External ear normal.     Nose: Nose normal.     Mouth/Throat:     Mouth: Mucous membranes  are moist.  Eyes:     Extraocular Movements: Extraocular movements intact.     Conjunctiva/sclera: Conjunctivae normal.  Cardiovascular:     Rate and Rhythm: Normal rate and regular rhythm.  Pulmonary:     Effort: Pulmonary effort is normal. No respiratory distress.  Abdominal:     General: There is no distension.     Palpations: Abdomen is soft.     Tenderness: There is abdominal tenderness. There is no guarding or rebound.  Musculoskeletal:        General: No swelling. Normal range of motion.     Cervical back: Normal range of motion and neck supple.     Right lower leg: No edema.     Left lower leg: No edema.  Skin:    General: Skin is warm and dry.     Coloration: Skin is not jaundiced or pale.  Neurological:     General: No focal deficit present.     Mental Status: He is alert and oriented to person, place, and time.     Cranial Nerves: No cranial nerve deficit.     Sensory: No sensory deficit.     Motor: No weakness.     Coordination: Coordination normal.  Psychiatric:        Mood and Affect: Mood and affect normal.        Speech: Speech normal.        Behavior: Behavior normal. Behavior is cooperative.     Comments: Passive suicidal ideation     ED Results / Procedures / Treatments   Labs (all labs ordered are listed, but only abnormal results are displayed) Labs Reviewed  COMPREHENSIVE METABOLIC PANEL - Abnormal; Notable for the following components:      Result Value   Total Bilirubin 1.5 (*)    All other components within normal limits  ACETAMINOPHEN LEVEL - Abnormal; Notable for the following components:   Acetaminophen (Tylenol), Serum <10 (*)    All other components within normal limits  SALICYLATE LEVEL - Abnormal; Notable for the following components:   Salicylate Lvl [id number] (*)    All other components within normal limits  RESP PANEL BY RT-PCR (RSV, FLU A&B, COVID)  RVPGX2  LIPASE, BLOOD  CBC  ETHANOL  VITAMIN B12  URINALYSIS, ROUTINE W REFLEX  MICROSCOPIC  RAPID URINE DRUG SCREEN, HOSP PERFORMED  TROPONIN I (HIGH SENSITIVITY)  TROPONIN I (HIGH SENSITIVITY)    EKG EKG Interpretation  Date/Time:  Monday March 25 2023 13:06:34 EDT Ventricular Rate:  82 PR Interval:  168 QRS Duration: 98 QT Interval:  384 QTC Calculation: 448 R Axis:   0 Text Interpretation: Normal sinus rhythm Confirmed by Godfrey Pick [phone]) on 03/25/2023 2:59:56 PM  Radiology CT ABDOMEN PELVIS W CONTRAST  Result Date: 03/25/2023 CLINICAL DATA:  Abdominal pain.  EXAM: CT ABDOMEN AND PELVIS WITH CONTRAST TECHNIQUE: Multidetector CT imaging of the abdomen and pelvis was performed using the standard protocol following bolus administration of intravenous contrast. RADIATION DOSE REDUCTION: This exam was performed according to the departmental dose-optimization program which includes automated exposure control, adjustment of the mA and/or kV according to patient size and/or use of iterative reconstruction technique. CONTRAST:  66mL OMNIPAQUE IOHEXOL 350 MG/ML SOLN COMPARISON:  None Available. FINDINGS: Lower chest: Clear lung bases. Hepatobiliary: Decreased liver attenuation consistent with fatty infiltration. Liver normal in size. No mass or focal lesion. Normal gallbladder. No bile duct dilation. Pancreas: Unremarkable. No pancreatic ductal dilatation or surrounding inflammatory changes. Spleen: Normal in size without focal abnormality. Adrenals/Urinary Tract: Adrenal glands are unremarkable. Kidneys are normal, without renal calculi, focal lesion, or hydronephrosis. Bladder is unremarkable. Stomach/Bowel: Normal stomach. Small bowel and colon are normal in caliber. No wall thickening. No inflammation. No evidence of appendicitis. Vascular/Lymphatic: No significant vascular findings are present. No enlarged abdominal or pelvic lymph nodes. Reproductive: Unremarkable. Other: No abdominal wall hernia or abnormality. No abdominopelvic ascites. Musculoskeletal: Normal. IMPRESSION:  1. No acute findings within the abdomen or pelvis. 2. Decreased liver attenuation consistent with hepatic steatosis. No other abnormality. Electronically Signed   By: Lajean Manes M.D.   On: 03/25/2023 16:37    Procedures Procedures    Medications Ordered in ED Medications  lactated ringers bolus 2,000 mL (2,000 mLs Intravenous New Bag/Given 03/25/23 1537)  ondansetron (ZOFRAN) injection 4 mg (4 mg Intravenous Given 03/25/23 1535)  chlordiazePOXIDE (LIBRIUM) capsule 50 mg (50 mg Oral Given 03/25/23 1535)  levETIRAcetam (KEPPRA) IVPB 1000 mg/100 mL premix (0 mg Intravenous Stopped 03/25/23 1628)  iohexol (OMNIPAQUE) 350 MG/ML injection 75 mL (75 mLs Intravenous Contrast Given 03/25/23 1630)    ED Course/ Medical Decision Making/ A&P                             Medical Decision Making Amount and/or Complexity of Data Reviewed Radiology: ordered.  Risk Prescription drug management.   This patient presents to the ED for concern of multiple complaints, this involves an extensive number of treatment options, and is a complaint that carries with it a high risk of complications and morbidity.  The differential diagnosis includes intoxication, withdrawal, infection, metabolic derangements   Co morbidities that complicate the patient evaluation  anxiety, alcohol abuse, depression, benzodiazepine dependence, drug withdrawal seizure, bipolar disorder   Additional history obtained:  Additional history obtained from N/A External records from outside source obtained and reviewed including EMR   Lab Tests:  I Ordered, and personally interpreted labs.  The pertinent results include: Normal kidney function, normal electrolytes, normal hemoglobin, no leukocytosis   Imaging Studies ordered:  I ordered imaging studies including CT of abdomen and pelvis I independently visualized and interpreted imaging which showed no acute findings I agree with the radiologist interpretation   Cardiac  Monitoring: / EKG:  The patient was maintained on a cardiac monitor.  I personally viewed and interpreted the cardiac monitored which showed an underlying rhythm of: Sinus rhythm   Consultations Obtained:  I requested consultation with the TTS,  and discussed lab and imaging findings as well as pertinent plan - they recommend: (Pending)   Problem List / ED Course / Critical interventions / Medication management  Patient presents for multiple complaints, as per HPI above.  Prior to being bedded in the ED, diagnostic workup was initiated.  Despite his report  of significant fluid losses and poor p.o. intake, kidney function is preserved on lab work.  Current ethanol level is undetectable consistent with his report of not drinking in the past 30 hours.  Given his report of hallucinations, patient was treated for alcohol withdrawal.  Librium was ordered.  Home dose of Keppra was ordered given his history of medication nonadherence over the past month.  IV fluids were ordered for hydration.  Zofran was ordered for nausea.  Patient currently endorses upper abdominal discomfort that radiates to back.  Lipase is normal.  CT scan was ordered.  Remaining lab work was reassuring.  CT scan showed no acute findings.  Patient had improved symptoms while in the ED.  He did report return of appetite.  Diet was ordered.  Given his reassuring workup and improved symptoms, I did speak with patient about possible discharge home.  At this time, he stated that he was afraid to go home because he is concerned of what he might do.  When asked to further clarify, patient states that he is afraid that he may do something to hurt himself.  Patient to remain in the ED awaiting TTS evaluation.  Although he does endorse recent hallucinations, patient's vital signs and exam findings are reassuring and not consistent with severe alcohol withdrawal.  Will continue Librium as needed for now.  He is medically clear.  Home medications were  ordered. I ordered medication including IV fluids for hydration; Librium for alcohol withdrawal; Keppra for seizure prophylaxis; Zofran for nausea Reevaluation of the patient after these medicines showed that the patient improved I have reviewed the patients home medicines and have made adjustments as needed   Social Determinants of Health:  Polysubstance abuse         Final Clinical Impression(s) / ED Diagnoses Final diagnoses:  Polysubstance abuse    Rx / DC Orders ED Discharge Orders     None         Godfrey Pick, MD 03/25/23 1736    Godfrey Pick, MD 03/25/23 1740

## 2023-03-25 NOTE — ED Notes (Addendum)
Pt reports loose stools/diarrhea; requesting something for same.  This RN notified pt of PRN order and will administer same.  Pt further inquiring about medication for sleep.  States he normally takes 150 mg Trazadone.  Pt updated that there are no current orders for Trazodone but that this RN would update his care team to see if they wanted to order this.

## 2023-03-26 ENCOUNTER — Inpatient Hospital Stay (HOSPITAL_COMMUNITY)
Admission: AD | Admit: 2023-03-26 | Discharge: 2023-03-31 | DRG: 885 | Disposition: A | Discharge status: Home / Self Care | Payer: No Typology Code available for payment source | Source: Intra-hospital | Attending: Psychiatry | Admitting: Psychiatry

## 2023-03-26 ENCOUNTER — Encounter (HOSPITAL_COMMUNITY): Payer: Self-pay | Admitting: Nurse Practitioner

## 2023-03-26 ENCOUNTER — Other Ambulatory Visit: Payer: Self-pay

## 2023-03-26 DIAGNOSIS — Z9152 Personal history of nonsuicidal self-harm: Secondary | ICD-10-CM

## 2023-03-26 DIAGNOSIS — G40909 Epilepsy, unspecified, not intractable, without status epilepticus: Secondary | ICD-10-CM | POA: Diagnosis present

## 2023-03-26 DIAGNOSIS — K219 Gastro-esophageal reflux disease without esophagitis: Secondary | ICD-10-CM | POA: Diagnosis present

## 2023-03-26 DIAGNOSIS — F1410 Cocaine abuse, uncomplicated: Secondary | ICD-10-CM | POA: Diagnosis not present

## 2023-03-26 DIAGNOSIS — Z9151 Personal history of suicidal behavior: Secondary | ICD-10-CM | POA: Diagnosis not present

## 2023-03-26 DIAGNOSIS — F17210 Nicotine dependence, cigarettes, uncomplicated: Secondary | ICD-10-CM | POA: Diagnosis present

## 2023-03-26 DIAGNOSIS — Z91148 Patient's other noncompliance with medication regimen for other reason: Secondary | ICD-10-CM

## 2023-03-26 DIAGNOSIS — F329 Major depressive disorder, single episode, unspecified: Secondary | ICD-10-CM | POA: Diagnosis present

## 2023-03-26 DIAGNOSIS — F10239 Alcohol dependence with withdrawal, unspecified: Secondary | ICD-10-CM | POA: Diagnosis present

## 2023-03-26 DIAGNOSIS — Z5941 Food insecurity: Secondary | ICD-10-CM

## 2023-03-26 DIAGNOSIS — R45851 Suicidal ideations: Secondary | ICD-10-CM | POA: Diagnosis present

## 2023-03-26 DIAGNOSIS — F1490 Cocaine use, unspecified, uncomplicated: Secondary | ICD-10-CM | POA: Diagnosis present

## 2023-03-26 DIAGNOSIS — Z818 Family history of other mental and behavioral disorders: Secondary | ICD-10-CM

## 2023-03-26 DIAGNOSIS — Z5982 Transportation insecurity: Secondary | ICD-10-CM | POA: Diagnosis not present

## 2023-03-26 DIAGNOSIS — Z23 Encounter for immunization: Secondary | ICD-10-CM

## 2023-03-26 DIAGNOSIS — F333 Major depressive disorder, recurrent, severe with psychotic symptoms: Secondary | ICD-10-CM | POA: Diagnosis present

## 2023-03-26 DIAGNOSIS — Z634 Disappearance and death of family member: Secondary | ICD-10-CM

## 2023-03-26 MED ORDER — ARIPIPRAZOLE 5 MG PO TABS
5.0000 mg | ORAL_TABLET | Freq: Every day | ORAL | Status: DC
  Administered 2023-03-27: 5 mg via ORAL
  Filled 2023-03-26 (×2): qty 1

## 2023-03-26 MED ORDER — HALOPERIDOL 5 MG PO TABS: 5.0000 mg | ORAL_TABLET | Freq: Three times a day (TID) | ORAL | Status: DC | PRN

## 2023-03-26 MED ORDER — NICOTINE 21 MG/24HR TD PT24
21.0000 mg | MEDICATED_PATCH | Freq: Every day | TRANSDERMAL | Status: DC
  Administered 2023-03-27 – 2023-03-31 (×5): 21 mg via TRANSDERMAL
  Filled 2023-03-26 (×7): qty 1

## 2023-03-26 MED ORDER — GABAPENTIN 400 MG PO CAPS
400.0000 mg | ORAL_CAPSULE | Freq: Three times a day (TID) | ORAL | Status: DC
  Administered 2023-03-26 – 2023-03-29 (×8): 400 mg via ORAL
  Filled 2023-03-26 (×13): qty 1

## 2023-03-26 MED ORDER — ADULT MULTIVITAMIN W/MINERALS CH
1.0000 | ORAL_TABLET | Freq: Every day | ORAL | Status: DC
  Administered 2023-03-27 – 2023-03-31 (×5): 1 via ORAL
  Filled 2023-03-26 (×7): qty 1

## 2023-03-26 MED ORDER — DIPHENHYDRAMINE HCL 50 MG/ML IJ SOLN: 50.0000 mg | Freq: Three times a day (TID) | INTRAMUSCULAR | Status: DC | PRN

## 2023-03-26 MED ORDER — ALUM & MAG HYDROXIDE-SIMETH 200-200-20 MG/5ML PO SUSP
30.0000 mL | ORAL | Status: DC | PRN
  Administered 2023-03-27: 30 mL via ORAL
  Filled 2023-03-26: qty 30

## 2023-03-26 MED ORDER — DULOXETINE HCL 60 MG PO CPEP
60.0000 mg | ORAL_CAPSULE | Freq: Every day | ORAL | Status: DC
  Administered 2023-03-27: 60 mg via ORAL
  Filled 2023-03-26 (×2): qty 1

## 2023-03-26 MED ORDER — LOPERAMIDE HCL 2 MG PO CAPS: 2.0000 mg | ORAL_CAPSULE | ORAL | Status: AC | PRN

## 2023-03-26 MED ORDER — LORAZEPAM 1 MG PO TABS: 2.0000 mg | ORAL_TABLET | Freq: Three times a day (TID) | ORAL | Status: DC | PRN

## 2023-03-26 MED ORDER — DIPHENHYDRAMINE HCL 25 MG PO CAPS: 50.0000 mg | ORAL_CAPSULE | Freq: Three times a day (TID) | ORAL | Status: DC | PRN

## 2023-03-26 MED ORDER — VITAMIN B-1 100 MG PO TABS
100.0000 mg | ORAL_TABLET | Freq: Every day | ORAL | Status: DC
  Administered 2023-03-27 – 2023-03-31 (×5): 100 mg via ORAL
  Filled 2023-03-26 (×7): qty 1

## 2023-03-26 MED ORDER — HYDROXYZINE HCL 25 MG PO TABS
25.0000 mg | ORAL_TABLET | Freq: Four times a day (QID) | ORAL | Status: AC | PRN
  Administered 2023-03-26 – 2023-03-28 (×2): 25 mg via ORAL
  Filled 2023-03-26 (×3): qty 1

## 2023-03-26 MED ORDER — LORAZEPAM 2 MG/ML IJ SOLN: 2.0000 mg | Freq: Three times a day (TID) | INTRAMUSCULAR | Status: DC | PRN

## 2023-03-26 MED ORDER — HALOPERIDOL LACTATE 5 MG/ML IJ SOLN: 5.0000 mg | Freq: Three times a day (TID) | INTRAMUSCULAR | Status: DC | PRN

## 2023-03-26 MED ORDER — BUSPIRONE HCL 10 MG PO TABS
10.0000 mg | ORAL_TABLET | Freq: Two times a day (BID) | ORAL | Status: DC
  Administered 2023-03-26 – 2023-03-27 (×2): 10 mg via ORAL
  Filled 2023-03-26 (×3): qty 1
  Filled 2023-03-26: qty 2
  Filled 2023-03-26: qty 1

## 2023-03-26 MED ORDER — TRAZODONE HCL 150 MG PO TABS
150.0000 mg | ORAL_TABLET | Freq: Every day | ORAL | Status: DC
  Administered 2023-03-26 – 2023-03-30 (×4): 150 mg via ORAL
  Filled 2023-03-26 (×9): qty 1

## 2023-03-26 MED ORDER — ACETAMINOPHEN 325 MG PO TABS: 650.0000 mg | ORAL_TABLET | Freq: Four times a day (QID) | ORAL | Status: DC | PRN

## 2023-03-26 MED ORDER — PANTOPRAZOLE SODIUM 40 MG PO TBEC
40.0000 mg | DELAYED_RELEASE_TABLET | Freq: Every day | ORAL | Status: DC
  Administered 2023-03-27 – 2023-03-31 (×5): 40 mg via ORAL
  Filled 2023-03-26: qty 1
  Filled 2023-03-26: qty 7
  Filled 2023-03-26 (×6): qty 1
  Filled 2023-03-26: qty 7
  Filled 2023-03-26: qty 1

## 2023-03-26 MED ORDER — PNEUMOCOCCAL 20-VAL CONJ VACC 0.5 ML IM SUSY
0.5000 mL | PREFILLED_SYRINGE | INTRAMUSCULAR | Status: AC
  Administered 2023-03-27: 0.5 mL via INTRAMUSCULAR
  Filled 2023-03-26: qty 0.5

## 2023-03-26 MED ORDER — MAGNESIUM HYDROXIDE 400 MG/5ML PO SUSP: 30.0000 mL | Freq: Every day | ORAL | Status: DC | PRN

## 2023-03-26 MED ORDER — CHLORDIAZEPOXIDE HCL 25 MG PO CAPS
25.0000 mg | ORAL_CAPSULE | Freq: Four times a day (QID) | ORAL | Status: DC | PRN
  Administered 2023-03-26 – 2023-03-27 (×2): 25 mg via ORAL
  Filled 2023-03-26 (×2): qty 1

## 2023-03-26 MED ORDER — ONDANSETRON 4 MG PO TBDP: 4.0000 mg | ORAL_TABLET | Freq: Four times a day (QID) | ORAL | Status: AC | PRN

## 2023-03-26 MED ORDER — LEVETIRACETAM 500 MG PO TABS
500.0000 mg | ORAL_TABLET | Freq: Two times a day (BID) | ORAL | Status: DC
  Administered 2023-03-26 – 2023-03-31 (×10): 500 mg via ORAL
  Filled 2023-03-26 (×7): qty 1
  Filled 2023-03-26: qty 14
  Filled 2023-03-26 (×2): qty 1
  Filled 2023-03-26: qty 14
  Filled 2023-03-26: qty 1
  Filled 2023-03-26: qty 14
  Filled 2023-03-26 (×4): qty 1
  Filled 2023-03-26: qty 14

## 2023-03-26 NOTE — ED Provider Notes (Signed)
Emergency Medicine Observation Re-evaluation Note  John Vaughan is a 32 y.o. male, seen on rounds today.  Pt initially presented to the ED for complaints of Suicidal and Withdrawal Currently, the patient is awaiting psychiatric admission.  Physical Exam  BP 118/82 (BP Location: Left Arm)   Pulse (!) 59   Temp 98.5 F (36.9 C)   Resp 17   Ht 6\' 1"  (1.854 m)   Wt 92.5 kg   SpO2 99%   BMI 26.91 kg/m  Physical Exam Alert and in no acute distress  ED Course / MDM  EKG:EKG Interpretation  Date/Time:  Monday March 25 2023 13:06:34 EDT Ventricular Rate:  82 PR Interval:  168 QRS Duration: 98 QT Interval:  384 QTC Calculation: 448 R Axis:   0 Text Interpretation: Normal sinus rhythm Confirmed by Godfrey Pick [phone]) on 03/25/2023 2:59:56 PM  I have reviewed the labs performed to date as well as medications administered while in observation.  Recent changes in the last 24 hours include none.  Plan  Current plan is for psych admission for suicidal ideation.    Milton Ferguson, MD 03/26/23 1124

## 2023-03-26 NOTE — ED Notes (Signed)
Voluntary admission form signed by pt and witnessed by this RN.

## 2023-03-26 NOTE — Progress Notes (Addendum)
Pt admitted voluntarily to [facility] inpatient adult unit.  Pt reported prior to admission he was experiencing suicidal ideation without a plan, increased depression x 1 week.  Pt attributes this to his mother being sick and facing foreclosure on her home on tomorrow.  Pt states he has been living between his boss and mother's home.  Pt stated he has been using ETOH and cocaine as a band-aid.  Pt reported falling a week ago and hitting his head but did not seek medical attention.  Pt also reported having a seizure a couple of days ago. Pt denied SI and AVH at the time of admission and contracts for safety.  Pt re-oriented to unit.  Pt safe on unit.

## 2023-03-26 NOTE — Progress Notes (Signed)
Noar did not attend wrap up group.

## 2023-03-26 NOTE — Progress Notes (Signed)
Pt was accepted to [facility] 03/26/23; Bed Assignment [id number] PENDING Vol Consent Faxed to [phone]  Pt meets inpatient criteria per Vesta Mixer, NP  Attending Physician will be Dr. Janine Limbo, MD  Report can be called to: Adult unit: [phone]  Pt can arrive after [facility] will coordinate with care team.  Care Team Notified: Day [facility] Lynnda Shields, RN, Leonia Reader, RN, Vesta Mixer, NP, Alinda Deem, RN, Night [facility] Scharlene Gloss, RN, Aletta Edouard, MD, [address], [place], [place] 03/26/2023 @ 12:14 PM

## 2023-03-26 NOTE — ED Notes (Signed)
Pt Given A Shower.Personal Care Products Were Given.Clean Scrubs And Socks Were Given And A Bed Change Was Done With Clean Sheets, Blankets And Pillow Cases.

## 2023-03-26 NOTE — Progress Notes (Signed)
This CSW requested [place] Scharlene Gloss, RN to review pt for inpatient behavioral health placement within [facility]. Pt meets inpatient behavioral health placement per Evette Georges, NP. CSW/ Disposition team will assist and follow with placement.  Benjaman Kindler, MSW, LCSWA 03/26/2023 1:27 AM

## 2023-03-27 ENCOUNTER — Encounter (HOSPITAL_COMMUNITY): Payer: Self-pay

## 2023-03-27 DIAGNOSIS — F333 Major depressive disorder, recurrent, severe with psychotic symptoms: Secondary | ICD-10-CM | POA: Diagnosis not present

## 2023-03-27 MED ORDER — FLUOXETINE HCL 20 MG PO CAPS
20.0000 mg | ORAL_CAPSULE | Freq: Every day | ORAL | Status: DC
  Administered 2023-03-28 – 2023-03-29 (×2): 20 mg via ORAL
  Filled 2023-03-27 (×3): qty 1

## 2023-03-27 MED ORDER — LORAZEPAM 1 MG PO TABS
1.0000 mg | ORAL_TABLET | Freq: Two times a day (BID) | ORAL | Status: AC
  Administered 2023-03-28 (×2): 1 mg via ORAL
  Filled 2023-03-27 (×3): qty 1

## 2023-03-27 MED ORDER — LORAZEPAM 1 MG PO TABS
1.0000 mg | ORAL_TABLET | Freq: Three times a day (TID) | ORAL | Status: AC
  Administered 2023-03-27 (×2): 1 mg via ORAL
  Filled 2023-03-27 (×2): qty 1

## 2023-03-27 MED ORDER — LORAZEPAM 0.5 MG PO TABS
0.5000 mg | ORAL_TABLET | Freq: Two times a day (BID) | ORAL | Status: AC
  Administered 2023-03-29 (×2): 0.5 mg via ORAL
  Filled 2023-03-27 (×2): qty 1

## 2023-03-27 MED ORDER — HALOPERIDOL 2 MG PO TABS
2.0000 mg | ORAL_TABLET | Freq: Every day | ORAL | Status: DC
  Administered 2023-03-28 – 2023-03-31 (×4): 2 mg via ORAL
  Filled 2023-03-27: qty 7
  Filled 2023-03-27 (×5): qty 1
  Filled 2023-03-27: qty 7

## 2023-03-27 MED ORDER — BUSPIRONE HCL 15 MG PO TABS
15.0000 mg | ORAL_TABLET | Freq: Three times a day (TID) | ORAL | Status: DC
  Administered 2023-03-27 – 2023-03-31 (×12): 15 mg via ORAL
  Filled 2023-03-27 (×3): qty 1
  Filled 2023-03-27: qty 3
  Filled 2023-03-27: qty 21
  Filled 2023-03-27 (×3): qty 1
  Filled 2023-03-27: qty 21
  Filled 2023-03-27 (×2): qty 1
  Filled 2023-03-27: qty 3
  Filled 2023-03-27 (×2): qty 1
  Filled 2023-03-27: qty 21
  Filled 2023-03-27 (×4): qty 1
  Filled 2023-03-27 (×2): qty 21
  Filled 2023-03-27: qty 1
  Filled 2023-03-27: qty 21

## 2023-03-27 NOTE — Progress Notes (Signed)
DAR NOTE: Patient presents with anxious affect and depressed mood.  Denies pain, auditory and visual hallucinations.  Endorses passive SI but verbally contracts for safety while in the hospital.  Rates depression at 8, hopelessness at 8, and anxiety at 10.  Reports all symptoms of withdrawals on self inventory form but affect and nursing assessment not congruent with patient's complaint.  Maintained on routine safety checks.  Medications given as prescribed.  Support and encouragement offered as needed.  Attended group and participated.  States goal for today is "my depression."  Patient observed socializing with peers in the dayroom.  Patient is safe on and off the unit.

## 2023-03-27 NOTE — Progress Notes (Signed)
   03/27/23 0600  15 Minute Checks  Location Bedroom  Visual Appearance Calm  Behavior Sleeping  Sleep (Behavioral Health Patients Only)  Calculate sleep? (Click Yes once per 24 hr at 0600 safety check) Yes  Documented sleep last 24 hours 9.25

## 2023-03-27 NOTE — Group Note (Signed)
Recreation Therapy Group Note   Group Topic:Other  Group Date: 03/27/2023 Start Time: 1400 End Time: 1440 Facilitators: Yissel Habermehl-McCall, LRT,CTRS Location: [address] Dayroom   Activity Description/Intervention: Therapeutic Drumming. Patients with peers and staff were given the opportunity to engage in a leader facilitated [place] with staff from the [employer], in partnership with [employer]. Nurse, adult and trained [employer], Devin Going leading with LRT observing and documenting intervention and pt response. This evidenced-based practice targets 7 areas of health and wellbeing in the human experience including: stress-reduction, exercise, self-expression, camaraderie/support, nurturing, spirituality, and music-making (leisure).   Goal Area(s) Addresses:  Patient will engage in pro-social way in music group.  Patient will follow directions of drum leader on the first prompt. Patient will demonstrate no behavioral issues during group.  Patient will identify if a reduction in stress level occurs as a result of participation in therapeutic drum circle.     Affect/Mood: Appropriate   Participation Level: Engaged   Participation Quality: Independent   Behavior: Appropriate   Speech/Thought Process: Focused   Insight: Good   Judgement: Good   Modes of Intervention: Nurse, adult   Patient Response to Interventions:  Engaged   Education Outcome:  Acknowledges education and In group clarification offered    Clinical Observations/Individualized Feedback: John Vaughan actively engaged in therapeutic drumming exercise and discussions. Pt was appropriate with peers, staff, and musical equipment for duration of programming.  Pt expressed he had a good time during group session.   Plan: Continue to engage patient in RT group sessions 2-3x/week.   John Vaughan, LRT,CTRS 03/27/2023 3:20 PM

## 2023-03-27 NOTE — Progress Notes (Signed)
   03/27/23 2300  Psych Admission Type (Psych Patients Only)  Admission Status Voluntary  Psychosocial Assessment  Patient Complaints Shakiness;Depression  Eye Contact Fair  Facial Expression Anxious  Affect Anxious;Appropriate to circumstance  Speech Logical/coherent  Interaction Assertive  Motor Activity Slow  Appearance/Hygiene Disheveled  Behavior Characteristics Cooperative;Anxious  Mood Anxious;Pleasant  Thought Process  Coherency WDL  Content WDL  Delusions None reported or observed  Perception WDL  Hallucination None reported or observed  Judgment Impaired  Confusion None  Danger to Self  Current suicidal ideation? Passive  Self-Injurious Behavior No self-injurious ideation or behavior indicators observed or expressed   Agreement Not to Harm Self Yes  Description of Agreement verbal  Danger to Others  Danger to Others None reported or observed
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
## 2023-03-27 NOTE — BHH Group Notes (Signed)
[facility] Group Notes:  (Nursing/MHT/Case Management/Adjunct)  Date:  03/27/2023  Time:  10:02 AM  Type of Therapy:  Group Therapy  Participation Level:  Active  Participation Quality:  Appropriate  Affect:  Appropriate  Cognitive:  Appropriate  Insight:  Good  Engagement in Group:  Engaged  Modes of Intervention:  Discussion  Summary of Progress/Problems:  John Vaughan 03/27/2023, 10:02 AM

## 2023-03-27 NOTE — BHH Group Notes (Signed)
[facility] Group Notes:  (Nursing/MHT/Case Management/Adjunct)  Date:  03/27/2023  Time:  4:12 PM  Type of Therapy:  Group Therapy  Participation Level:  Active  Participation Quality:  Appropriate  Affect:  Appropriate  Cognitive:  Appropriate  Insight:  Good  Engagement in Group:  Engaged  Modes of Intervention:  Discussion  Summary of Progress/Problems:  John Vaughan 03/27/2023, 4:12 PM

## 2023-03-27 NOTE — BH IP Treatment Plan (Signed)
Interdisciplinary Treatment and Diagnostic Plan Update  03/27/2023 Time of Session: 10:30 AM  John Vaughan MRN: [id number]  Principal Diagnosis: MDD (major depressive disorder), recurrent, severe, with psychosis  Secondary Diagnoses: Principal Problem:   MDD (major depressive disorder), recurrent, severe, with psychosis   Current Medications:  Current Facility-Administered Medications  Medication Dose Route Frequency Provider Last Rate Last Admin   acetaminophen (TYLENOL) tablet 650 mg  650 mg Oral Q6H PRN Vesta Mixer, NP       alum & mag hydroxide-simeth (MAALOX/MYLANTA) 200-200-20 MG/5ML suspension 30 mL  30 mL Oral Q4H PRN Vesta Mixer, NP       busPIRone (BUSPAR) tablet 15 mg  15 mg Oral TID Winfred Leeds, Nadir, MD       diphenhydrAMINE (BENADRYL) capsule 50 mg  50 mg Oral TID PRN Vesta Mixer, NP       Or   diphenhydrAMINE (BENADRYL) injection 50 mg  50 mg Intramuscular TID PRN Vesta Mixer, NP       [START ON 03/28/2023] FLUoxetine (PROZAC) capsule 20 mg  20 mg Oral Daily Attiah, Nadir, MD       gabapentin (NEURONTIN) capsule 400 mg  400 mg Oral TID Vesta Mixer, NP   400 mg at 03/27/23 1155   [START ON 03/28/2023] haloperidol (HALDOL) tablet 2 mg  2 mg Oral Daily Attiah, Nadir, MD       haloperidol (HALDOL) tablet 5 mg  5 mg Oral TID PRN Vesta Mixer, NP       Or   haloperidol lactate (HALDOL) injection 5 mg  5 mg Intramuscular TID PRN Vesta Mixer, NP       hydrOXYzine (ATARAX) tablet 25 mg  25 mg Oral Q6H PRN Vesta Mixer, NP   25 mg at 03/26/23 2231   levETIRAcetam (KEPPRA) tablet 500 mg  500 mg Oral BID Vesta Mixer, NP   500 mg at 03/27/23 0827   loperamide (IMODIUM) capsule 2-4 mg  2-4 mg Oral PRN Vesta Mixer, NP       LORazepam (ATIVAN) tablet 2 mg  2 mg Oral TID PRN Vesta Mixer, NP       Or   LORazepam (ATIVAN) injection 2 mg  2 mg Intramuscular TID PRN Vesta Mixer, NP       LORazepam (ATIVAN) tablet 1 mg  1 mg Oral TID  Dian Situ, MD       Followed by   Derrill Memo ON 03/28/2023] LORazepam (ATIVAN) tablet 1 mg  1 mg Oral BID Winfred Leeds, Nadir, MD       Followed by   Derrill Memo ON 03/29/2023] LORazepam (ATIVAN) tablet 0.5 mg  0.5 mg Oral BID Winfred Leeds, Nadir, MD       magnesium hydroxide (MILK OF MAGNESIA) suspension 30 mL  30 mL Oral Daily PRN Vesta Mixer, NP       multivitamin with minerals tablet 1 tablet  1 tablet Oral Daily Vesta Mixer, NP   1 tablet at 03/27/23 0827   nicotine (NICODERM CQ - dosed in mg/24 hours) patch 21 mg  21 mg Transdermal Daily Vesta Mixer, NP   21 mg at 03/27/23 0826   ondansetron (ZOFRAN-ODT) disintegrating tablet 4 mg  4 mg Oral Q6H PRN Vesta Mixer, NP       pantoprazole (PROTONIX) EC tablet 40 mg  40 mg Oral Daily Vesta Mixer, NP   40 mg at 03/27/23 [id number]   thiamine (Vitamin B-1) tablet 100 mg  100 mg Oral Daily Vesta Mixer, NP   100 mg at 03/27/23 [phone]  traZODone (DESYREL) tablet 150 mg  150 mg Oral QHS Evette Georges, NP   150 mg at 03/26/23 2235   PTA Medications: Medications Prior to Admission  Medication Sig Dispense Refill Last Dose   ARIPiprazole (ABILIFY) 5 MG tablet Take 1 tablet (5 mg total) by mouth daily. For mood stabilization 30 tablet 0    busPIRone (BUSPAR) 10 MG tablet Take 1 tablet (10 mg total) by mouth 2 (two) times daily. For anxiety 60 tablet 0    cyanocobalamin 100 MCG tablet Take 100 mcg by mouth daily.      DULoxetine (CYMBALTA) 60 MG capsule Take 1 capsule (60 mg total) by mouth daily. For depression 30 capsule 0    gabapentin (NEURONTIN) 400 MG capsule Take 1 capsule (400 mg total) by mouth 3 (three) times daily. For agitation 90 capsule 0    hydrOXYzine (ATARAX) 25 MG tablet Take 1 tablet (25 mg total) by mouth 3 (three) times daily as needed for anxiety (Sleep). 90 tablet 0    levETIRAcetam (KEPPRA) 500 MG tablet Take 1 tablet (500 mg total) by mouth 2 (two) times daily. For seizures 60 tablet 0    Multiple Vitamin (QUINTABS) TABS Take 1  tablet by mouth daily.      nicotine (NICODERM CQ - DOSED IN MG/24 HOURS) 21 mg/24hr patch Place 1 patch (21 mg total) onto the skin daily. (May buy from over the counter): For smoking cessation (Patient not taking: Reported on 03/25/2023) 1 patch 0    pantoprazole (PROTONIX) 40 MG tablet Take 1 tablet (40 mg total) by mouth daily. For acid reflux 30 tablet 0    traZODone (DESYREL) 150 MG tablet Take 1 tablet (150 mg total) by mouth at bedtime. For sleep 30 tablet 0     Patient Stressors:    Patient Strengths:    Treatment Modalities: Medication Management, Group therapy, Case management,  1 to 1 session with clinician, Psychoeducation, Recreational therapy.   Physician Treatment Plan for Primary Diagnosis: MDD (major depressive disorder), recurrent, severe, with psychosis Long Term Goal(s):     Short Term Goals:    Medication Management: Evaluate patient's response, side effects, and tolerance of medication regimen.  Therapeutic Interventions: 1 to 1 sessions, Unit Group sessions and Medication administration.  Evaluation of Outcomes: Not Progressing  Physician Treatment Plan for Secondary Diagnosis: Principal Problem:   MDD (major depressive disorder), recurrent, severe, with psychosis  Long Term Goal(s):     Short Term Goals:       Medication Management: Evaluate patient's response, side effects, and tolerance of medication regimen.  Therapeutic Interventions: 1 to 1 sessions, Unit Group sessions and Medication administration.  Evaluation of Outcomes: Not Progressing   RN Treatment Plan for Primary Diagnosis: MDD (major depressive disorder), recurrent, severe, with psychosis Long Term Goal(s): Knowledge of disease and therapeutic regimen to maintain health will improve  Short Term Goals: Ability to remain free from injury will improve, Ability to verbalize frustration and anger appropriately will improve, Ability to demonstrate self-control, Ability to participate in  decision making will improve, Ability to verbalize feelings will improve, Ability to disclose and discuss suicidal ideas, Ability to identify and develop effective coping behaviors will improve, and Compliance with prescribed medications will improve  Medication Management: RN will administer medications as ordered by provider, will assess and evaluate patient's response and provide education to patient for prescribed medication. RN will report any adverse and/or side effects to prescribing provider.  Therapeutic Interventions: 1 on 1 counseling sessions, Psychoeducation, Medication  administration, Evaluate responses to treatment, Monitor vital signs and CBGs as ordered, Perform/monitor CIWA, COWS, AIMS and Fall Risk screenings as ordered, Perform wound care treatments as ordered.  Evaluation of Outcomes: Not Progressing   LCSW Treatment Plan for Primary Diagnosis: MDD (major depressive disorder), recurrent, severe, with psychosis Long Term Goal(s): Safe transition to appropriate next level of care at discharge, Engage patient in therapeutic group addressing interpersonal concerns.  Short Term Goals: Engage patient in aftercare planning with referrals and resources, Increase social support, Increase ability to appropriately verbalize feelings, Increase emotional regulation, Facilitate acceptance of mental health diagnosis and concerns, Facilitate patient progression through stages of change regarding substance use diagnoses and concerns, Identify triggers associated with mental health/substance abuse issues, and Increase skills for wellness and recovery  Therapeutic Interventions: Assess for all discharge needs, 1 to 1 time with Social worker, Explore available resources and support systems, Assess for adequacy in community support network, Educate family and significant other(s) on suicide prevention, Complete Psychosocial Assessment, Interpersonal group therapy.  Evaluation of Outcomes: Not  Progressing   Progress in Treatment: Attending groups: Yes. Participating in groups: Yes. Taking medication as prescribed: Yes. Toleration medication: Yes. Family/Significant other contact made: No, will contact:  whoever pt give permission for CSW to contact Patient understands diagnosis: Yes. Discussing patient identified problems/goals with staff: Yes. Medical problems stabilized or resolved: Yes. Denies suicidal/homicidal ideation: Yes. Issues/concerns per patient self-inventory: No.   New problem(s) identified: No, Describe:  None reported   New Short Term/Long Term Goal(s):detox, medication management for mood stabilization; elimination of SI thoughts; development of comprehensive mental wellness/sobriety plan   Patient Goals:  " get stable on my medication, get over depression, and stop drinking/doing drugs "   Discharge Plan or Barriers: Patient recently admitted. CSW will continue to follow and assess for appropriate referrals and possible discharge planning.    Reason for Continuation of Hospitalization: Anxiety Depression Medication stabilization Suicidal ideation Withdrawal symptoms  Estimated Length of Stay: 5-7 days   Last 3 [place] Suicide Severity Risk Score: [place] Admission (Current) from 03/26/2023 in [facility] ED from 03/25/2023 in [facility] Emergency Department at [facility] ED from 01/20/2023 in [facility] Emergency Department at [place] No Risk High Risk No Risk       Last PHQ 2/9 Scores:    06/11/2022    9:33 PM 05/29/2022    2:01 PM 04/12/2021    2:51 PM  Depression screen PHQ 2/9  Decreased Interest 2 1 2   Down, Depressed, Hopeless 3 2 3   PHQ - 2 Score 5 3 5   Altered sleeping 3 1 3   Tired, decreased energy 2 1 2   Change in appetite 2 1 2   Feeling bad or failure about yourself  2 2 3   Trouble concentrating 1 2 3   Moving slowly or fidgety/restless 1 0 3   Suicidal thoughts 1 1 0  PHQ-9 Score 17 11 21   Difficult doing work/chores Very difficult Somewhat difficult     Scribe for Treatment Team: Charlett Lango 03/27/2023 12:11 PM

## 2023-03-27 NOTE — H&P (Signed)
Psychiatric Admission Assessment Adult  Patient Identification: John Vaughan MRN:  [id number] Date of Evaluation:  03/27/2023  Chief Complaint:  MDD (major depressive disorder) [F32.9]  History of Present Illness:  John Vaughan is a 32 y.o., male with a past psychiatric history significant for MDD, GAD, alcohol dependence, cocaine use disorder and benzodiazepine dependence who presents to the [facility] from [facility] ED for evaluation and management of worsening depression and SI.  According to outside records, the patient presented to [facility] reporting body aches, SI with no plan.  Initial assessment on 03/27/2023, patient was evaluated on the inpatient unit, the patient reports history of depression and anxiety, stopped the medications a few months ago after he ran out and had no money to buy the, reports came to emergency room "I got sick and really stressed out" reports felt sick secondary to withdrawal from alcohol drinking alcohol 4 times weekly 340 ounce beer each time with last drink on Sunday morning.  He reports over the past few months stress related to mother being in the hospital, however they live and is being foreclosed on today, had to stop medication as had no money to buy them.  He reports increased depression decreased sleep decreased energy and motivation decreased appetite lost about 15 pounds in 1 month decreased concentration and ability to focus feeling hopeless helpless and worthless worsening gradually over the past few months, SI for the past month with recent worsening over the past few days reports had a plan on and off to cut his wrist and kill himself last time Monday morning denies any current active plan to harm himself and agrees to contract for safety in the hospital, denies HI, reports auditory hallucination when feeling depressed "telling me to end it all" reports occasions of visual hallucination but with further questions it seems to happen when  using cocaine.  Denies paranoia or other delusions he does report some symptoms consistent with mania lasting few days up to a week last time 2 months ago but he also reports using cocaine once every 1 to 2 weeks so probably it is secondary to substance use.  Reports witnessing his father's death with some nightmares related to that but denies other symptoms of PTSD.  Chart review: Emergency room notes prior to this admission were reviewed, also reviewed notes from previous hospitalization last time December 2023  Psych meds prior to admission:  Last medication list included Abilify 5 mg daily, BuSpar 10 mg twice daily, Cymbalta 60 mg daily, gabapentin 400 mg 3 times daily, Vistaril 25 mg as needed for anxiety, Keppra 500 mg twice daily, Protonix 40 mg daily, trazodone 150 mg at bedtime.  Patient confirms has not been taking his medication for 1 to 2 months after he ran out secondary to cost issues.  Sleep Sleep: Poor sleep reported  Collateral information: None at this time  Past Psychiatric History:  Prior Psychiatric diagnoses: Depression, anxiety, alcohol dependence, benzodiazepine dependence and cocaine use Past Psychiatric Hospitalizations: Multiple hospitalizations to different facilities last time at [facility] December 2023  History of self mutilation: History of cutting since teenage years, last time 2 years ago Past suicide attempts: Patient reports 4 previous suicide attempts last time 3 years ago when had a gun held to his head and pulled the trigger "but it got stuck" Past history of HI, violent or aggressive behavior: Denies  Past Psychiatric medications trials: Abilify questionable help in the high cost, BuSpar did not help but most recent dose, Cymbalta  not helpful, gabapentin helpful for back pain and mood, Vistaril helpful on and off for anxiety, trazodone helpful for sleep and dose of 150 mg at bedtime, history of trying Remeron in the past.  Also patient reports being prescribed  Klonopin 1 year ago for anxiety but no records found on PDMP History of ECT/TMS: Denies  Outpatient psychiatric Follow up: None recently last time years ago Prior Outpatient Therapy: None recently last time years ago    Is the patient at risk to self? Yes.    Has the patient been a risk to self in the past 6 months? No.  Has the patient been a risk to self within the distant past? No.  Is the patient a risk to others? No.  Has the patient been a risk to others in the past 6 months? No.  Has the patient been a risk to others within the distant past? No.    Substance Use History: Alcohol: Drinking alcohol 4 times weekly average 340 ounce beers each time history of withdrawals, history of withdrawal seizures and tremors, last withdrawal seizures reported 1 year ago, denies history of DTs Tobacoo: Less than half pack of cigarette daily Marijuana: Denies Cocaine: Admits to using cocaine every 1 to 2 weeks last use last Friday Stimulants: Denies IV drug use: Denies Opiates: Denies Prescribed Meds abuse: He tested positive for benzodiazepine and reports was prescribed Klonopin 1 year ago but his report is inconsistent with his PDMP report when I only found 1 prescription of Librium for 10 tablets in March 2022 H/O withdrawals, blackouts, DTs: History of withdrawal tremors and withdrawal seizures, history of blackouts but denies history of DTs History of Detox / Rehab: History of inpatient rehab once at [facility] for 30 days September 2023 DUI: Denies  Alcohol Screening: 1. How often do you have a drink containing alcohol?: 2 to 3 times a week 2. How many drinks containing alcohol do you have on a typical day when you are drinking?: 10 or more 3. How often do you have six or more drinks on one occasion?: Weekly AUDIT-C Score: 10 4. How often during the last year have you found that you were not able to stop drinking once you had started?: Weekly 5. How often during the last year have you  failed to do what was normally expected from you because of drinking?: Weekly 6. How often during the last year have you needed a first drink in the morning to get yourself going after a heavy drinking session?: Weekly 7. How often during the last year have you had a feeling of guilt of remorse after drinking?: Weekly 8. How often during the last year have you been unable to remember what happened the night before because you had been drinking?: Never 9. Have you or someone else been injured as a result of your drinking?: Yes, but not in the last year 10. Has a relative or friend or a doctor or another health worker been concerned about your drinking or suggested you cut down?: Yes, but not in the last year Alcohol Use Disorder Identification Test Final Score (AUDIT): 26 Alcohol Brief Interventions/Follow-up: Alcohol education/Brief advice  Substance Abuse History in the last 12 months:  Yes.     Tobacco Screening:     Past Medical/Surgical History:  Past Medical History:  Diagnosis Date   Alcohol abuse    Anxiety    Bipolar 2 disorder    Depression    Seizures     Past Surgical  History:  Procedure Laterality Date   HAND SURGERY Right     Family History:  Family History  Problem Relation Age of Onset   Anxiety disorder Mother    Alcohol abuse Father    Anxiety disorder Maternal Aunt    Anxiety disorder Maternal Grandmother    Alcohol abuse Paternal Grandfather     Family Psychiatric History:  Psychiatric illness: Reports multiple family members on both sides of the family was diagnosed with bipolar disorder Suicide: Denies Substance Abuse: Reports multiple family members on both sides of the family with alcohol and cocaine use  Social History:  Social History   Substance and Sexual Activity  Alcohol Use Yes   Alcohol/week: 2.0 standard drinks of alcohol   Types: 2 Cans of beer per week   Comment: 48 beers a week     Social History   Substance and Sexual Activity   Drug Use Yes   Types: Cocaine    Living situation: Lives with his mother but notes house is being foreclosed on today, unsure where he will go after discharge Social support: Limited Marital Status: Never married Children: 63 years old son lives with his mother patient is unaware where to both of them live, have not seen him for the past few years Education: High school education Employment: 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", applying for disability for mental illness Military service: Denies Legal history: Denies pending charges or court dates Trauma: Reports trauma related to witnessing his father's death otherwise none Access to guns: Denies   Allergies:  No Known Allergies  Lab Results:  Results for orders placed or performed during the hospital encounter of 03/25/23 (from the past 31 hour(s))  Resp panel by RT-PCR (RSV, Flu A&B, Covid) Anterior Nasal Swab     Status: None   Collection Time: 03/25/23 12:52 PM   Specimen: Anterior Nasal Swab  Result Value Ref Range   SARS Coronavirus 2 by RT PCR NEGATIVE NEGATIVE   Influenza A by PCR NEGATIVE NEGATIVE   Influenza B by PCR NEGATIVE NEGATIVE    Comment: (NOTE) The Xpert Xpress SARS-CoV-2/FLU/RSV plus assay is intended as an aid in the diagnosis of influenza from Nasopharyngeal swab specimens and should not be used as a sole basis for treatment. Nasal washings and aspirates are unacceptable for Xpert Xpress SARS-CoV-2/FLU/RSV testing.  Fact Sheet for Patients: EntrepreneurPulse.com.au  Fact Sheet for Healthcare Providers: IncredibleEmployment.be  This test is not yet approved or cleared by the [place] FDA and has been authorized for detection and/or diagnosis of SARS-CoV-2 by FDA under an Emergency Use Authorization (EUA). This EUA will remain in effect (meaning this test can be used) for the duration of the COVID-19 declaration under Section  564(b)(1) of the Act, 21 U.S.C. section 360bbb-3(b)(1), unless the authorization is terminated or revoked.     Resp Syncytial Virus by PCR NEGATIVE NEGATIVE    Comment: (NOTE) Fact Sheet for Patients: EntrepreneurPulse.com.au  Fact Sheet for Healthcare Providers: IncredibleEmployment.be  This test is not yet approved or cleared by the [place] FDA and has been authorized for detection and/or diagnosis of SARS-CoV-2 by FDA under an Emergency Use Authorization (EUA). This EUA will remain in effect (meaning this test can be used) for the duration of the COVID-19 declaration under Section 564(b)(1) of the Act, 21 U.S.C. section 360bbb-3(b)(1), unless the authorization is terminated or revoked.  Performed at [facility], [place] [address] [place], [place] [address]   Vitamin B12  Status: None   Collection Time: 03/25/23 12:52 PM  Result Value Ref Range   Vitamin B-12 415 180 - 914 pg/mL    Comment: (NOTE) This assay is not validated for testing neonatal or myeloproliferative syndrome specimens for Vitamin B12 levels. Performed at [facility], [place] [address], [place], [place] [address]   Lipase, blood     Status: None   Collection Time: 03/25/23  1:15 PM  Result Value Ref Range   Lipase 33 11 - 51 U/L    Comment: Performed at [place] [address], [place], [place] [address]  Comprehensive metabolic panel     Status: Abnormal   Collection Time: 03/25/23  1:15 PM  Result Value Ref Range   Sodium 139 135 - 145 mmol/L   Potassium 3.9 3.5 - 5.1 mmol/L   Chloride 101 98 - 111 mmol/L   CO2 23 22 - 32 mmol/L   Glucose, Bld 95 70 - 99 mg/dL    Comment: Glucose reference range applies only to samples taken after fasting for at least 8 hours.   BUN 8 6 - 20 mg/dL   Creatinine, Ser 1.09 0.61 - 1.24 mg/dL   Calcium 10.1 8.9 - 10.3 mg/dL   Total Protein 7.8 6.5 - 8.1 g/dL   Albumin 4.4 3.5 - 5.0 g/dL   AST 35 15 -  41 U/L   ALT 32 0 - 44 U/L   Alkaline Phosphatase 64 38 - 126 U/L   Total Bilirubin 1.5 (H) 0.3 - 1.2 mg/dL   GFR, Estimated >60 >60 mL/min    Comment: (NOTE) Calculated using the CKD-EPI Creatinine Equation (2021)    Anion gap 15 5 - 15    Comment: Performed at [place] [address] [place] [address]  CBC     Status: None   Collection Time: 03/25/23  1:15 PM  Result Value Ref Range   WBC 4.4 4.0 - 10.5 K/uL   RBC 5.07 4.22 - 5.81 MIL/uL   Hemoglobin 16.6 13.0 - 17.0 g/dL   HCT 47.0 39.0 - 52.0 %   MCV 92.7 80.0 - 100.0 fL   MCH 32.7 26.0 - 34.0 pg   MCHC 35.3 30.0 - 36.0 g/dL   RDW 12.5 11.5 - 15.5 %   Platelets 231 150 - 400 K/uL   nRBC 0.0 0.0 - 0.2 %    Comment: Performed at [facility], [place] [address] [place], [place] [address]  Troponin I (High Sensitivity)     Status: None   Collection Time: 03/25/23  1:15 PM  Result Value Ref Range   Troponin I (High Sensitivity) <2 <18 ng/L    Comment: (NOTE) Elevated high sensitivity troponin I (hsTnI) values and significant  changes across serial measurements may suggest ACS but many other  chronic and acute conditions are known to elevate hsTnI results.  Refer to the "Links" section for chest pain algorithms and additional  guidance. Performed at [facility], [place] [address] [place], [place] [address]   Ethanol     Status: None   Collection Time: 03/25/23  1:15 PM  Result Value Ref Range   Alcohol, Ethyl (B) <10 <10 mg/dL    Comment: (NOTE) Lowest detectable limit for serum alcohol is 10 mg/dL.  For medical purposes only. Performed at [facility], [place] [address] [place],  [address]   Acetaminophen level     Status: Abnormal   Collection  Time: 03/25/23  1:15 PM  Result Value Ref Range   Acetaminophen (Tylenol), Serum <10 (L) 10 - 30 ug/mL    Comment: (NOTE) Therapeutic concentrations vary significantly. A range of 10-30 ug/mL  may be an effective concentration for  many patients. However, some  are best treated at concentrations outside of this range. Acetaminophen concentrations >150 ug/mL at 4 hours after ingestion  and >50 ug/mL at 12 hours after ingestion are often associated with  toxic reactions.  Performed at [facility], [place] [address] [place], [place] [id number]   Salicylate level     Status: Abnormal   Collection Time: 03/25/23  1:15 PM  Result Value Ref Range   Salicylate Lvl [id number] (L) 7.0 - 30.0 mg/dL    Comment: Performed at [facility] [address], [place], [place] [address]  Troponin I (High Sensitivity)     Status: None   Collection Time: 03/25/23  3:40 PM  Result Value Ref Range   Troponin I (High Sensitivity) <2 <18 ng/L    Comment: (NOTE) Elevated high sensitivity troponin I (hsTnI) values and significant  changes across serial measurements may suggest ACS but many other  chronic and acute conditions are known to elevate hsTnI results.  Refer to the "Links" section for chest pain algorithms and additional  guidance. Performed at [facility], [place] [address], [place], [place] [address]   Urinalysis, Routine w reflex microscopic -Urine, Clean Catch     Status: Abnormal   Collection Time: 03/25/23  7:25 PM  Result Value Ref Range   Color, Urine YELLOW YELLOW   APPearance CLEAR CLEAR   Specific Gravity, Urine >1.046 (H) 1.005 - 1.030   pH 6.0 5.0 - 8.0   Glucose, UA NEGATIVE NEGATIVE mg/dL   Hgb urine dipstick NEGATIVE NEGATIVE   Bilirubin Urine NEGATIVE NEGATIVE   Ketones, ur NEGATIVE NEGATIVE mg/dL   Protein, ur NEGATIVE NEGATIVE mg/dL   Nitrite NEGATIVE NEGATIVE   Leukocytes,Ua NEGATIVE NEGATIVE    Comment: Performed at [place] [address] [place], [place] [address]  Rapid urine drug screen (hospital performed)     Status: Abnormal   Collection Time: 03/25/23  7:25 PM  Result Value Ref Range   Opiates NONE DETECTED NONE DETECTED   Cocaine POSITIVE (A) NONE DETECTED    Benzodiazepines POSITIVE (A) NONE DETECTED   Amphetamines NONE DETECTED NONE DETECTED   Tetrahydrocannabinol NONE DETECTED NONE DETECTED   Barbiturates NONE DETECTED NONE DETECTED    Comment: (NOTE) DRUG SCREEN FOR MEDICAL PURPOSES ONLY.  IF CONFIRMATION IS NEEDED FOR ANY PURPOSE, NOTIFY LAB WITHIN 5 DAYS.  LOWEST DETECTABLE LIMITS FOR URINE DRUG SCREEN Drug Class                     Cutoff (ng/mL) Amphetamine and metabolites    1000 Barbiturate and metabolites    200 Benzodiazepine                 200 Opiates and metabolites        300 Cocaine and metabolites        300 THC                            50 Performed at [facility], [place] [address] [place], [place] [address]     Blood Alcohol level:  Lab Results  Component Value Date   [facility] <10 03/25/2023  ETH 294 (H) [id number]    Metabolic Disorder Labs:  Lab Results  Component Value Date   HGBA1C 5.4 05/14/2022   MPG 108.28 05/14/2022   MPG 102.54 10/25/2021   No results found for: "PROLACTIN" Lab Results  Component Value Date   CHOL 196 05/14/2022   TRIG 93 05/14/2022   HDL 57 05/14/2022   CHOLHDL 3.4 05/14/2022   VLDL 19 05/14/2022   LDLCALC 120 (H) 05/14/2022   LDLCALC 93 10/25/2021    Current Medications: Current Facility-Administered Medications  Medication Dose Route Frequency Provider Last Rate Last Admin   acetaminophen (TYLENOL) tablet 650 mg  650 mg Oral Q6H PRN Vesta Mixer, NP       alum & mag hydroxide-simeth (MAALOX/MYLANTA) 200-200-20 MG/5ML suspension 30 mL  30 mL Oral Q4H PRN Vesta Mixer, NP       busPIRone (BUSPAR) tablet 15 mg  15 mg Oral TID Winfred Leeds, Savannaha Stonerock, MD       diphenhydrAMINE (BENADRYL) capsule 50 mg  50 mg Oral TID PRN Vesta Mixer, NP       Or   diphenhydrAMINE (BENADRYL) injection 50 mg  50 mg Intramuscular TID PRN Vesta Mixer, NP       [START ON 03/28/2023] FLUoxetine (PROZAC) capsule 20 mg  20 mg Oral Daily Ashlyn Cabler, MD       gabapentin (NEURONTIN)  capsule 400 mg  400 mg Oral TID Vesta Mixer, NP   400 mg at 03/27/23 1155   [START ON 03/28/2023] haloperidol (HALDOL) tablet 2 mg  2 mg Oral Daily Karysa Heft, MD       haloperidol (HALDOL) tablet 5 mg  5 mg Oral TID PRN Vesta Mixer, NP       Or   haloperidol lactate (HALDOL) injection 5 mg  5 mg Intramuscular TID PRN Vesta Mixer, NP       hydrOXYzine (ATARAX) tablet 25 mg  25 mg Oral Q6H PRN Vesta Mixer, NP   25 mg at 03/26/23 2231   levETIRAcetam (KEPPRA) tablet 500 mg  500 mg Oral BID Vesta Mixer, NP   500 mg at 03/27/23 0827   loperamide (IMODIUM) capsule 2-4 mg  2-4 mg Oral PRN Vesta Mixer, NP       LORazepam (ATIVAN) tablet 2 mg  2 mg Oral TID PRN Vesta Mixer, NP       Or   LORazepam (ATIVAN) injection 2 mg  2 mg Intramuscular TID PRN Vesta Mixer, NP       LORazepam (ATIVAN) tablet 1 mg  1 mg Oral TID Dian Situ, MD       Followed by   Derrill Memo ON 03/28/2023] LORazepam (ATIVAN) tablet 1 mg  1 mg Oral BID Winfred Leeds, Leveta Wahab, MD       Followed by   Derrill Memo ON 03/29/2023] LORazepam (ATIVAN) tablet 0.5 mg  0.5 mg Oral BID Winfred Leeds, Deshawnda Acrey, MD       magnesium hydroxide (MILK OF MAGNESIA) suspension 30 mL  30 mL Oral Daily PRN Vesta Mixer, NP       multivitamin with minerals tablet 1 tablet  1 tablet Oral Daily Vesta Mixer, NP   1 tablet at 03/27/23 0827   nicotine (NICODERM CQ - dosed in mg/24 hours) patch 21 mg  21 mg Transdermal Daily Vesta Mixer, NP   21 mg at 03/27/23 0826   ondansetron (ZOFRAN-ODT) disintegrating tablet 4 mg  4 mg Oral Q6H PRN Vesta Mixer, NP       pantoprazole (PROTONIX) EC tablet 40 mg  40  mg Oral Daily Vesta Mixer, NP   40 mg at 03/27/23 [id number]   thiamine (Vitamin B-1) tablet 100 mg  100 mg Oral Daily Vesta Mixer, NP   100 mg at 03/27/23 0827   traZODone (DESYREL) tablet 150 mg  150 mg Oral Dorie Rank, NP   150 mg at 03/26/23 2235    PTA Medications: Medications Prior to Admission  Medication Sig  Dispense Refill Last Dose   ARIPiprazole (ABILIFY) 5 MG tablet Take 1 tablet (5 mg total) by mouth daily. For mood stabilization 30 tablet 0    busPIRone (BUSPAR) 10 MG tablet Take 1 tablet (10 mg total) by mouth 2 (two) times daily. For anxiety 60 tablet 0    cyanocobalamin 100 MCG tablet Take 100 mcg by mouth daily.      DULoxetine (CYMBALTA) 60 MG capsule Take 1 capsule (60 mg total) by mouth daily. For depression 30 capsule 0    gabapentin (NEURONTIN) 400 MG capsule Take 1 capsule (400 mg total) by mouth 3 (three) times daily. For agitation 90 capsule 0    hydrOXYzine (ATARAX) 25 MG tablet Take 1 tablet (25 mg total) by mouth 3 (three) times daily as needed for anxiety (Sleep). 90 tablet 0    levETIRAcetam (KEPPRA) 500 MG tablet Take 1 tablet (500 mg total) by mouth 2 (two) times daily. For seizures 60 tablet 0    Multiple Vitamin (QUINTABS) TABS Take 1 tablet by mouth daily.      nicotine (NICODERM CQ - DOSED IN MG/24 HOURS) 21 mg/24hr patch Place 1 patch (21 mg total) onto the skin daily. (May buy from over the counter): For smoking cessation (Patient not taking: Reported on 03/25/2023) 1 patch 0    pantoprazole (PROTONIX) 40 MG tablet Take 1 tablet (40 mg total) by mouth daily. For acid reflux 30 tablet 0    traZODone (DESYREL) 150 MG tablet Take 1 tablet (150 mg total) by mouth at bedtime. For sleep 30 tablet 0     Musculoskeletal: Strength & Muscle Tone: within normal limits Gait & Station: normal Patient leans: N/A   Physical Findings: AIMS: Facial and Oral Movements Muscles of Facial Expression: None, normal Lips and Perioral Area: None, normal Jaw: None, normal Tongue: None, normal,Extremity Movements Upper (arms, wrists, hands, fingers): None, normal Lower (legs, knees, ankles, toes): None, normal, Trunk Movements Neck, shoulders, hips: None, normal, Overall Severity Severity of abnormal movements (highest score from questions above): None, normal Incapacitation due to  abnormal movements: None, normal Patient's awareness of abnormal movements (rate only patient's report): No Awareness, Dental Status Current problems with teeth and/or dentures?: No Does patient usually wear dentures?: No  CIWA:  CIWA-Ar Total: 5 COWS:     Psychiatric Specialty Exam:  General Appearance: Appears at stated age, casually dressed, disheveled and unkempt  Behavior: Cooperative and pleasant, occasionally unreliable with inconsistent reports for example noting had a prescription for Klonopin 1 year ago which does not match with PDMP reports  Psychomotor Activity: Mild psychomotor retardation noted  Eye Contact: Limited Speech: Decreased amount, decreased tone and volume   Mood: Moderately dysphoric Affect: Restricted sad affect  Thought Process: Linear and goal-directed Descriptions of Associations: intact Thought Content: Hallucinations: Admits to auditory hallucinations "to end it all" probably related to depression, reports vague visual hallucination seeing his brother talking to him at times probably related to substance use  Delusions: No paranoia  Suicidal Thoughts: Admits to passive SI wishing self dead but denies active SI, intention, plan  Homicidal  Thoughts: Denies HI, intention, plan   Alertness/Orientation: Alert and fully oriented  Insight: poor Judgment: poor  Memory: Intact  Executive Functions  Concentration: Intact Attention Span: Fair Recall: Intact Fund of Knowledge: Fair   Physical Exam:  Physical Exam Vitals and nursing note reviewed.  Constitutional:      Appearance: Normal appearance.  HENT:     Head: Normocephalic and atraumatic.     Nose: Nose normal.  Eyes:     Pupils: Pupils are equal, round, and reactive to light.  Pulmonary:     Effort: Pulmonary effort is normal.  Musculoskeletal:        General: Normal range of motion.     Cervical back: Normal range of motion.  Neurological:     General: No focal deficit present.      Mental Status: He is alert and oriented to person, place, and time.     Comments: Mild tremors noted probably related to withdrawals    Review of Systems  All other systems reviewed and are negative.  Blood pressure 110/82, pulse 65, temperature 97.8 F (36.6 C), temperature source Oral, resp. rate 16, height 6' (1.829 m), weight 92.6 kg, SpO2 98 %. Body mass index is 27.69 kg/m.   Assets  Assets:Communication Skills; Desire for Improvement; Financial Resources/Insurance; Housing; Physical Health; Resilience; Social Support    Treatment Plan Summary: Daily contact with patient to assess and evaluate symptoms and progress in treatment and Medication management  ASSESSMENT:  Principal Diagnosis: MDD (major depressive disorder), recurrent, severe, with psychosis Diagnosis:  Principal Problem:   MDD (major depressive disorder), recurrent, severe, with psychosis   PLAN: Safety and Monitoring:  -- Voluntary admission to inpatient psychiatric unit for safety, stabilization and treatment  -- Daily contact with patient to assess and evaluate symptoms and progress in treatment  -- Patient's case to be discussed in multi-disciplinary team meeting  -- Observation Level : q15 minute checks  -- Vital signs:  q12 hours  -- Precautions: suicide, elopement, and assault  2. Medications:   Patient reports lack of compliance with outpatient medication secondary to cost issues, discussed with patient following the changes to improve compliance and decrease cost of medications.  Discontinue Cymbalta for lack of efficacy and cost  Start trial of Prozac 20 mg daily for depression and long-term treatment of anxiety  Titrate BuSpar from 10 mg twice daily to 15 mg 3 times daily for anxiety  Discontinue Abilify secondary to cost issues  Start trial of Haldol 2 mg daily for psychosis and to augment antidepressant effect, monitor effects and safety  Continue Vistaril 25 mg as needed for  anxiety  Continue trazodone 150 mg at bedtime for sleep given reported efficacy  Continue Protonix 40 mg daily for GERD  Continue Keppra 500 mg twice daily for seizure disorder reported and discussed with patient need to comply with outpatient follow-up with neurology/primary care provider for further management  Start Ativan taper for the next 3 days for alcohol withdrawals  Continue to monitor CIWA for withdrawals  Discontinue Librium as needed with CIWA to avoid over presenting self-reported symptoms med seeking behavior for benzodiazepine  The risks/benefits/side-effects/alternatives to this medication were discussed in detail with the patient and time was given for questions. The patient consents to medication trial.    -- Metabolic profile and EKG monitoring obtained while on an atypical antipsychotic (BMI: Lipid Panel: HbgA1c: QTc:)      3. Labs Reviewed: CMP no significant abnormalities noted, B12 normal, CBC no significant abnormalities  noted, UA no UTI, tox screen positive for benzodiazepine and cocaine, alcohol level less than 10, EKG 4/1 QTc 448      Lab ordered: TSH, hemoglobin A1c, fasting lipid panel   4. Tobacco Use Disorder  -- Nicotine patch 21mg /24 hours ordered  -- Smoking cessation encouraged  5. Group and Therapy: -- Encouraged patient to participate in unit milieu and in scheduled group therapies   --Substance Use counseling: Was counseled regarding need to abstain completely from alcohol cocaine and any further treatment with benzodiazepine after discharge  6. Discharge Planning:   -- Social work and case management to assist with discharge planning and identification of hospital follow-up needs prior to discharge  -- Estimated LOS: 5-7 days  -- Discharge Concerns: Need to establish a safety plan; Medication compliance and effectiveness  -- Discharge Goals: Return home with outpatient referrals for mental health follow-up including medication  management/psychotherapy  Discussed with patient recommendation for inpatient substance use rehab referral, at this time he refuses and agrees only with IOP after discharge, will continue to follow.  The patient is agreeable with the medication plan, as above. We will monitor the patient's response to pharmacologic treatment, and adjust medications as necessary. Patient is encouraged to participate in group therapy while admitted to the psychiatric unit. We will address other chronic and acute stressors, which contributed to the patient's increased depression, anxiety and SI, in order to reduce the risk of self-harm at discharge.   Physician Treatment Plan for Primary Diagnosis: MDD (major depressive disorder), recurrent, severe, with psychosis Long Term Goal(s): Improvement in symptoms so as ready for discharge  Short Term Goals: Ability to identify changes in lifestyle to reduce recurrence of condition will improve, Ability to verbalize feelings will improve, Ability to disclose and discuss suicidal ideas, Ability to demonstrate self-control will improve, and Ability to identify and develop effective coping behaviors will improve   I certify that inpatient services furnished can reasonably be expected to improve the patient's condition.    Total Time Spent in Direct Patient Care:  I personally spent 55 minutes on the unit in direct patient care. The direct patient care time included face-to-face time with the patient, reviewing the patient's chart, communicating with other professionals, and coordinating care. Greater than 50% of this time was spent in counseling or coordinating care with the patient regarding goals of hospitalization, psycho-education, and discharge planning needs.   Dian Situ, MD 4/3/202412:29 PM

## 2023-03-27 NOTE — Progress Notes (Signed)
   03/26/23 2300  Psych Admission Type (Psych Patients Only)  Admission Status Voluntary  Psychosocial Assessment  Patient Complaints Sleep disturbance;Shakiness;Anxiety  Eye Contact Fair  Facial Expression Blank  Affect Appropriate to circumstance;Anxious  Speech Slow;Logical/coherent  Interaction Minimal  Motor Activity Tremors  Appearance/Hygiene Disheveled  Behavior Characteristics Appropriate to situation;Anxious  Mood Anxious;Pleasant  Thought Process  Coherency WDL  Content WDL  Delusions None reported or observed  Perception WDL  Hallucination None reported or observed  Judgment Impaired  Confusion None  Danger to Self  Current suicidal ideation? Denies  Danger to Others  Danger to Others None reported or observed

## 2023-03-27 NOTE — [facility] Suicide Risk Assessment (Signed)
[facility] Admission Suicide Risk Assessment   Nursing information obtained from:  Patient Demographic factors:  Male, Caucasian, Low socioeconomic status Current Mental Status:  NA Loss Factors:  Financial problems / change in socioeconomic status Historical Factors:  Prior suicide attempts, Family history of mental illness or substance abuse Risk Reduction Factors:  NA  Total Time spent with patient: 1 hour Principal Problem: MDD (major depressive disorder), recurrent, severe, with psychosis Diagnosis:  Principal Problem:   MDD (major depressive disorder), recurrent, severe, with psychosis  Subjective Data: see H&P  Continued Clinical Symptoms:  Alcohol Use Disorder Identification Test Final Score (AUDIT): 26 The "Alcohol Use Disorders Identification Test", Guidelines for Use in Primary Care, Second Edition.  [employer]). Score between 0-7:  no or low risk or alcohol related problems. Score between 8-15:  moderate risk of alcohol related problems. Score between 16-19:  high risk of alcohol related problems. Score 20 or above:  warrants further diagnostic evaluation for alcohol dependence and treatment.   CLINICAL FACTORS:   Depression:   Anhedonia Comorbid alcohol abuse/dependence Hopelessness Impulsivity Insomnia Severe Alcohol/Substance Abuse/Dependencies   Musculoskeletal: Strength & Muscle Tone: within normal limits Gait & Station: normal Patient leans: N/A  Psychiatric Specialty Exam:  Presentation  General Appearance:  Appropriate for Environment; Casual; Fairly Groomed  Eye Contact: Good  Speech: Clear and Coherent; Normal Rate  Speech Volume: Normal  Handedness: Right   Mood and Affect  Mood: Euthymic  Affect: Appropriate; Congruent   Thought Process  Thought Processes: Coherent; Goal Directed; Linear  Descriptions of Associations:Intact  Orientation:Full (Time, Place and Person)  Thought Content:Logical  History of  Schizophrenia/Schizoaffective disorder:No  Duration of Psychotic Symptoms:N/A  Hallucinations:No data recorded Ideas of Reference:None  Suicidal Thoughts:No data recorded Homicidal Thoughts:No data recorded  Sensorium  Memory: Immediate Good; Recent Good; Remote Good  Judgment: Good  Insight: Good   Executive Functions  Concentration: Good  Attention Span: Good  Recall: Good  Fund of Knowledge: Good  Language: Good   Psychomotor Activity  Psychomotor Activity:No data recorded  Assets  Assets: Communication Skills; Desire for Improvement; Financial Resources/Insurance; Housing; Physical Health; Resilience; Social Support   Sleep  Sleep:No data recorded   Physical Exam: Physical Exam ROS Blood pressure 113/89, pulse 82, temperature 97.8 F (36.6 C), temperature source Oral, resp. rate 16, height 6' (1.829 m), weight 92.6 kg, SpO2 98 %. Body mass index is 27.69 kg/m.   COGNITIVE FEATURES THAT CONTRIBUTE TO RISK:  None    SUICIDE RISK:   Moderate:  Frequent suicidal ideation with limited intensity, and duration, some specificity in terms of plans, no associated intent, good self-control, limited dysphoria/symptomatology, some risk factors present, and identifiable protective factors, including available and accessible social support.  PLAN OF CARE: see H&P  I certify that inpatient services furnished can reasonably be expected to improve the patient's condition.   Davis Ambrosini Winfred Leeds, MD 03/27/2023, 11:18 AM

## 2023-03-28 DIAGNOSIS — F333 Major depressive disorder, recurrent, severe with psychotic symptoms: Secondary | ICD-10-CM | POA: Diagnosis not present

## 2023-03-28 LAB — TSH: TSH: 1.641 u[IU]/mL (ref 0.350–4.500)

## 2023-03-28 LAB — LIPID PANEL
Cholesterol: 199 mg/dL (ref 0–200)
HDL: 64 mg/dL (ref >40)
LDL Cholesterol: 104 mg/dL — ABNORMAL HIGH (ref 0–99)
Total CHOL/HDL Ratio: 3.1 RATIO
Triglycerides: 157 mg/dL — ABNORMAL HIGH (ref <150)
VLDL: 31 mg/dL (ref 0–40)

## 2023-03-28 NOTE — Group Note (Signed)
Date:  03/28/2023 Time:  1:58 PM  Group Topic/Focus:  Wellness Toolbox:   The focus of this group is to discuss various aspects of wellness, balancing those aspects and exploring ways to increase the ability to experience wellness.  Patients will create a wellness toolbox for use upon discharge.    Participation Level:  Active  Participation Quality:  Appropriate  Affect:  Appropriate  Cognitive:  Appropriate  Insight: Appropriate  Engagement in Group:  Engaged  Modes of Intervention:  Discussion  Additional Comments:  Pt was engaged in self encouragement group  Garvin Fila 03/28/2023, 1:58 PM

## 2023-03-28 NOTE — Progress Notes (Signed)
   03/28/23 0615  15 Minute Checks  Location Dayroom  Visual Appearance Calm  Behavior Composed  Sleep (Behavioral Health Patients Only)  Calculate sleep? (Click Yes once per 24 hr at 0600 safety check) Yes  Documented sleep last 24 hours 9.75

## 2023-03-28 NOTE — Progress Notes (Signed)
Pt stated Hallucinations are getting better    03/28/23 2000  Psych Admission Type (Psych Patients Only)  Admission Status Voluntary  Psychosocial Assessment  Patient Complaints Depression  Eye Contact Fair  Facial Expression Anxious  Affect Appropriate to circumstance  Speech Logical/coherent  Interaction Assertive  Motor Activity Slow  Appearance/Hygiene Unremarkable  Behavior Characteristics Cooperative  Mood Pleasant  Aggressive Behavior  Effect No apparent injury  Thought Process  Coherency WDL  Content WDL  Delusions WDL  Perception Hallucinations  Hallucination Auditory  Judgment Impaired  Confusion None  Danger to Self  Current suicidal ideation? Denies  Danger to Others  Danger to Others None reported or observed

## 2023-03-28 NOTE — Group Note (Signed)
Date:  03/28/2023 Time:  10:01 AM  Group Topic/Focus:  Goals Group:   The focus of this group is to help patients establish daily goals to achieve during treatment and discuss how the patient can incorporate goal setting into their daily lives to aide in recovery.    Participation Level:  Active  Participation Quality:  Appropriate  Affect:  Appropriate  Cognitive:  Appropriate  Insight: Appropriate  Engagement in Group:  Engaged  Modes of Intervention:  Discussion  Additional Comments:  Work on my depression  Garvin Fila 03/28/2023, 10:01 AM

## 2023-03-28 NOTE — Group Note (Unsigned)
Date:  03/28/2023 Time:  9:34 AM  Group Topic/Focus:  Orientation:   The focus of this group is to educate the patient on the purpose and policies of crisis stabilization and provide a format to answer questions about their admission.  The group details unit policies and expectations of patients while admitted.     Participation Level:  {[facility] PARTICIPATION WO:[id number]  Participation Quality:  {[facility] PARTICIPATION QUALITY:22265}  Affect:  {[facility] AFFECT:22266}  Cognitive:  {[facility] COGNITIVE:22267}  Insight: {[facility] Insight2:20797}  Engagement in Group:  {[facility] ENGAGEMENT IN BP:[id number]  Modes of Intervention:  {[facility] MODES OF INTERVENTION:22269}  Additional Comments:  ***  John Vaughan 03/28/2023, 9:34 AM

## 2023-03-28 NOTE — Progress Notes (Signed)
[facility] MD Progress Note  03/28/2023 9:44 AM John Vaughan  MRN:  [id number]   Reason for Admission:  John Vaughan is a 32 y.o., male with a past psychiatric history significant for MDD, GAD, alcohol dependence, cocaine use disorder and benzodiazepine dependence who presents to the [facility] from [facility] ED for evaluation and management of worsening depression and SI.  According to outside records, the patient presented to [facility] reporting body aches, SI with no plan.The patient is currently on Hospital Day 2.   Chart Review from last 24 hours:  The patient's chart was reviewed and nursing notes were reviewed. The patient's case was discussed in multidisciplinary team meeting. Per [facility] patient received Atarax as needed last dose 4/2 at 9 PM, he is compliant with medications on the unit with no issues reported.  Staff report patient this morning to have bright fluent affect, CIWA score down to 3 and 1 yesterday at night and this morning.  Information Obtained Today During Patient Interview: The patient was seen and evaluated on the unit. On assessment today the patient reports still feeling depressed hopeless and helpless but denies passive or active SI intention or plan.  He presents more fluent and interactive during evaluation continues to report depressed mood stating depression 7 out of 1010 being the worst, reports increased sleep but woke up early probably related to sleeping most the day yesterday "I was catching up on my sleep" reports improving appetite gradually, denies HI, reports improving less frequently lately hallucinations but no commanding voices to harm self in fact he describes voices to be positive "telling me I need to straighten up" he reports no visual hallucinations since admission, he reports he spoke to his mother and the plan after discharge to go stay with her at the hotel room.  He continues to refuse substance use inpatient residential rehab after  discharge despite encouragement "I want to go to work" reports motivation after discharge to go back to work and do outpatient follow-up appointments.  He agrees with intensive outpatient and outpatient individual therapy.  He reports on and off craving to drinking, currently on gabapentin, reports he tried naltrexone and had side effects "I felt weird"  Sleep  Sleep: 9.75 hours reported, patient reports slept well but woke up early in the morning probably related to sleeping most of the day yesterday.   Principal Problem: MDD (major depressive disorder), recurrent, severe, with psychosis Diagnosis: Principal Problem:   MDD (major depressive disorder), recurrent, severe, with psychosis    Past Psychiatric History: Prior Psychiatric diagnoses: Depression, anxiety, alcohol dependence, benzodiazepine dependence and cocaine use Past Psychiatric Hospitalizations: Multiple hospitalizations to different facilities last time at [facility] December 2023   History of self mutilation: History of cutting since teenage years, last time 2 years ago Past suicide attempts: Patient reports 4 previous suicide attempts last time 3 years ago when had a gun held to his head and pulled the trigger "but it got stuck" Past history of HI, violent or aggressive behavior: Denies   Past Psychiatric medications trials: Abilify questionable help in the high cost, BuSpar did not help but most recent dose, Cymbalta not helpful, gabapentin helpful for back pain and mood, Vistaril helpful on and off for anxiety, trazodone helpful for sleep and dose of 150 mg at bedtime, history of trying Remeron in the past.  Also patient reports being prescribed Klonopin 1 year ago for anxiety but no records found on PDMP History of ECT/TMS: Denies   Outpatient psychiatric  Follow up: None recently last time years ago Prior Outpatient Therapy: None recently last time years ago  Past Medical History:  Past Medical History:  Diagnosis Date    Alcohol abuse    Anxiety    Bipolar 2 disorder    Depression    Seizures     Past Surgical History:  Procedure Laterality Date   HAND SURGERY Right    Family History:  Family History  Problem Relation Age of Onset   Anxiety disorder Mother    Alcohol abuse Father    Anxiety disorder Maternal Aunt    Anxiety disorder Maternal Grandmother    Alcohol abuse Paternal Grandfather    Family Psychiatric  History: Psychiatric illness: Reports multiple family members on both sides of the family was diagnosed with bipolar disorder Suicide: Denies Substance Abuse: Reports multiple family members on both sides of the family with alcohol and cocaine use Social History: Living situation: Lives with his mother but notes house is being foreclosed on today, unsure where he will go after discharge Social support: Limited Marital Status: Never married Children: 79 years old son lives with his mother patient is unaware where to both of them live, have not seen him for the past few years Education: High school education Employment: 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", applying for disability for mental illness Military service: Denies Legal history: Denies pending charges or court dates Trauma: Reports trauma related to witnessing his father's death otherwise none Access to guns: Denies  Current Medications: Current Facility-Administered Medications  Medication Dose Route Frequency Provider Last Rate Last Admin   acetaminophen (TYLENOL) tablet 650 mg  650 mg Oral Q6H PRN Vesta Mixer, NP       alum & mag hydroxide-simeth (MAALOX/MYLANTA) 200-200-20 MG/5ML suspension 30 mL  30 mL Oral Q4H PRN Vesta Mixer, NP   30 mL at 03/27/23 1817   busPIRone (BUSPAR) tablet 15 mg  15 mg Oral TID Dian Situ, MD   15 mg at 03/28/23 0803   diphenhydrAMINE (BENADRYL) capsule 50 mg  50 mg Oral TID PRN Vesta Mixer, NP       Or   diphenhydrAMINE (BENADRYL) injection 50 mg   50 mg Intramuscular TID PRN Vesta Mixer, NP       FLUoxetine (PROZAC) capsule 20 mg  20 mg Oral Daily Lashonna Rieke, MD   20 mg at 03/28/23 0802   gabapentin (NEURONTIN) capsule 400 mg  400 mg Oral TID Vesta Mixer, NP   400 mg at 03/28/23 0803   haloperidol (HALDOL) tablet 2 mg  2 mg Oral Daily Katherleen Folkes, MD   2 mg at 03/28/23 0803   haloperidol (HALDOL) tablet 5 mg  5 mg Oral TID PRN Vesta Mixer, NP       Or   haloperidol lactate (HALDOL) injection 5 mg  5 mg Intramuscular TID PRN Vesta Mixer, NP       hydrOXYzine (ATARAX) tablet 25 mg  25 mg Oral Q6H PRN Vesta Mixer, NP   25 mg at 03/26/23 2231   levETIRAcetam (KEPPRA) tablet 500 mg  500 mg Oral BID Vesta Mixer, NP   500 mg at 03/28/23 0804   loperamide (IMODIUM) capsule 2-4 mg  2-4 mg Oral PRN Vesta Mixer, NP       LORazepam (ATIVAN) tablet 2 mg  2 mg Oral TID PRN Vesta Mixer, NP       Or   LORazepam (ATIVAN) injection 2 mg  2 mg Intramuscular TID PRN  Vesta Mixer, NP       LORazepam (ATIVAN) tablet 1 mg  1 mg Oral BID Winfred Leeds, Dragon Thrush, MD   1 mg at 03/28/23 [id number]   Followed by   Derrill Memo ON 03/29/2023] LORazepam (ATIVAN) tablet 0.5 mg  0.5 mg Oral BID Winfred Leeds, Marieelena Bartko, MD       magnesium hydroxide (MILK OF MAGNESIA) suspension 30 mL  30 mL Oral Daily PRN Vesta Mixer, NP       multivitamin with minerals tablet 1 tablet  1 tablet Oral Daily Vesta Mixer, NP   1 tablet at 03/28/23 0803   nicotine (NICODERM CQ - dosed in mg/24 hours) patch 21 mg  21 mg Transdermal Daily Vesta Mixer, NP   21 mg at 03/28/23 0804   ondansetron (ZOFRAN-ODT) disintegrating tablet 4 mg  4 mg Oral Q6H PRN Vesta Mixer, NP       pantoprazole (PROTONIX) EC tablet 40 mg  40 mg Oral Daily Vesta Mixer, NP   40 mg at 03/28/23 [id number]   thiamine (Vitamin B-1) tablet 100 mg  100 mg Oral Daily Vesta Mixer, NP   100 mg at 03/28/23 0803   traZODone (DESYREL) tablet 150 mg  150 mg Oral Dorie Rank, NP   150 mg  at 03/26/23 2235    Lab Results:  Results for orders placed or performed during the hospital encounter of 03/26/23 (from the past 48 hour(s))  TSH     Status: None   Collection Time: 03/28/23  6:44 AM  Result Value Ref Range   TSH 1.641 0.350 - 4.500 uIU/mL    Comment: Performed by a 3rd Generation assay with a functional sensitivity of <=0.01 uIU/mL. Performed at [facility], [place] [address], [place], [place] [address]   Lipid panel     Status: Abnormal   Collection Time: 03/28/23  6:44 AM  Result Value Ref Range   Cholesterol 199 0 - 200 mg/dL   Triglycerides 157 (H) <150 mg/dL   HDL 64 >40 mg/dL   Total CHOL/HDL Ratio 3.1 RATIO   VLDL 31 0 - 40 mg/dL   LDL Cholesterol 104 (H) 0 - 99 mg/dL    Comment:        Total Cholesterol/HDL:CHD Risk Coronary Heart Disease Risk Table                     Men   Women  1/2 Average Risk   3.4   3.3  Average Risk       5.0   4.4  2 X Average Risk   9.6   7.1  3 X Average Risk  23.4   11.0        Use the calculated Patient Ratio above and the CHD Risk Table to determine the patient's CHD Risk.        ATP III CLASSIFICATION (LDL):  <100     mg/dL   Optimal  100-129  mg/dL   Near or Above                    Optimal  130-159  mg/dL   Borderline  160-189  mg/dL   High  >190     mg/dL   Very High Performed at [facility] [address] [place], [place] [address]     Blood Alcohol level:  Lab Results  Component Value Date   ETH <10 03/25/2023   ETH 294 (H) [id number]    Metabolic Disorder Labs:  Lab Results  Component Value Date   HGBA1C 5.4 05/14/2022   MPG 108.28 05/14/2022   MPG 102.54 10/25/2021   No results found for: "PROLACTIN" Lab Results  Component Value Date   CHOL 199 03/28/2023   TRIG 157 (H) 03/28/2023   HDL 64 03/28/2023   CHOLHDL 3.1 03/28/2023   VLDL 31 03/28/2023   LDLCALC 104 (H) 03/28/2023   LDLCALC 120 (H) 05/14/2022    Physical Findings: AIMS: Facial and  Oral Movements Muscles of Facial Expression: None, normal Lips and Perioral Area: None, normal Jaw: None, normal Tongue: None, normal,Extremity Movements Upper (arms, wrists, hands, fingers): None, normal Lower (legs, knees, ankles, toes): None, normal, Trunk Movements Neck, shoulders, hips: None, normal, Overall Severity Severity of abnormal movements (highest score from questions above): None, normal Incapacitation due to abnormal movements: None, normal Patient's awareness of abnormal movements (rate only patient's report): No Awareness, Dental Status Current problems with teeth and/or dentures?: No Does patient usually wear dentures?: No  CIWA:  CIWA-Ar Total: 1 COWS:     Musculoskeletal: Strength & Muscle Tone: within normal limits Gait & Station: normal Patient leans: N/A  Psychiatric Specialty Exam:  General Appearance: Appears at stated age, casually dressed, disheveled and unkempt   Behavior: Cooperative and pleasant, more fluent than yesterday   Psychomotor Activity: Mild psychomotor retardation noted   Eye Contact: Improved, fair Speech: Decreased amount, decreased tone and volume     Mood: Less dysphoric Affect: Restricted sad affect but improving   Thought Process: Linear and goal-directed Descriptions of Associations: intact Thought Content: Hallucinations: Admits to auditory hallucinations, denies command with the translation to harm self or others, denies visual hallucinations Delusions: No paranoia  Suicidal Thoughts: Denies passive SI or active SI, intention, plan  Homicidal Thoughts: Denies HI, intention, plan    Alertness/Orientation: Alert and fully oriented   Insight: Improved yet limited Judgment: Improved gait limited   Memory: Intact   Executive Functions  Concentration: Intact Attention Span: Fair Recall: Intact Fund of Knowledge: Fair  Assets  Assets: Armed forces logistics/support/administrative officer; Desire for Improvement; Financial Resources/Insurance;  Housing; Physical Health; Resilience; Social Support    Physical Exam: Physical Exam Vitals and nursing note reviewed.    Review of Systems  All other systems reviewed and are negative.  Blood pressure 113/88, pulse 91, temperature 97.6 F (36.4 C), temperature source Oral, resp. rate 16, height 6' (1.829 m), weight 92.6 kg, SpO2 99 %. Body mass index is 27.69 kg/m.   Treatment Plan Summary: Daily contact with patient to assess and evaluate symptoms and progress in treatment and Medication management  ASSESSMENT:  Diagnoses / Active Problems: Principal Problem: MDD (major depressive disorder), recurrent, severe, with psychosis Diagnosis: Principal Problem:   MDD (major depressive disorder), recurrent, severe, with psychosis   PLAN: Safety and Monitoring:  -- Voluntary admission to inpatient psychiatric unit for safety, stabilization and treatment  -- Daily contact with patient to assess and evaluate symptoms and progress in treatment  -- Patient's case to be discussed in multi-disciplinary team meeting  -- Observation Level : q15 minute checks  -- Vital signs:  q12 hours  -- Precautions: suicide, elopement, and assault  2. Medications:   Patient reports lack of compliance with outpatient medication secondary to cost issues, discussed with patient following the changes to improve compliance and decrease cost of medications.             Cymbalta was discontinued on admission for lack of efficacy and cost  Continue trial of Prozac 20 mg daily for depression and long-term treatment of anxiety             Continue BuSpar 15 mg 3 times daily for anxiety            Abilify was discontinued on admission secondary to cost issues             Continue trial of Haldol 2 mg daily for psychosis and to augment antidepressant effect, monitor effects and safety             Continue Vistaril 25 mg as needed for anxiety             Continue trazodone 150 mg at bedtime for sleep  given reported efficacy             Continue Protonix 40 mg daily for GERD             Continue Keppra 500 mg twice daily for seizure disorder reported and discussed with patient need to comply with outpatient follow-up with neurology/primary care provider for further management            Continue Ativan taper for alcohol withdrawals             Continue to monitor CIWA for withdrawals             Discontinue Librium as needed with CIWA to avoid over presenting self-reported symptoms med seeking behavior for benzodiazepine  The risks/benefits/side-effects/alternatives to this medication were discussed in detail with the patient and time was given for questions. The patient consents to medication trial.    -- Metabolic profile and EKG monitoring obtained while on an atypical antipsychotic (BMI: Lipid Panel: HbgA1c: QTc:)      3. Pertinent labs: CMP no significant abnormalities noted, B12 normal, CBC no significant abnormalities noted, UA no UTI, tox screen positive for benzodiazepine and cocaine, alcohol level less than 10, EKG 4/1 QTc 448  TSH within normal level, lipid panel within normal level except for elevated LDL slightly 104, elevated triglyceride 157     Lab ordered: Hemoglobin A1c pending   4. Tobacco Use Disorder  -- Nicotine patch 21mg /24 hours ordered  -- Smoking cessation encouraged  5. Group and Therapy: -- Encouraged patient to participate in unit milieu and in scheduled group therapies     Was counseled regarding need to abstain completely from alcohol cocaine and any further treatment with benzodiazepine after discharge   -- Short Term Goals: Ability to identify changes in lifestyle to reduce recurrence of condition will improve, Ability to verbalize feelings will improve, Ability to disclose and discuss suicidal ideas, Ability to demonstrate self-control will improve, and Ability to identify and develop effective coping behaviors will improve  -- Long Term Goals:  Improvement in symptoms so as ready for discharge  6. Discharge Planning:   -- Social work and case management to assist with discharge planning and identification of hospital follow-up needs prior to discharge  -- Estimated LOS: 5-7 days  -- Discharge Concerns: Need to establish a safety plan; Medication compliance and effectiveness  -- Discharge Goals: Return home with outpatient referrals for mental health follow-up including medication management/psychotherapy  Discussed with patient recommendation for inpatient substance use rehab referral, at this time he refuses and agrees only with IOP after discharge, will continue to follow.     Total Time Spent in Direct Patient Care:  I personally spent 35 minutes on the unit in direct patient care. The direct patient  care time included face-to-face time with the patient, reviewing the patient's chart, communicating with other professionals, and coordinating care. Greater than 50% of this time was spent in counseling or coordinating care with the patient regarding goals of hospitalization, psycho-education, and discharge planning needs.   Meliana Canner Winfred Leeds, MD 03/28/2023, 9:44 AM

## 2023-03-28 NOTE — Group Note (Signed)
Occupational Therapy Group Note  Group Topic: Sleep Hygiene  Group Date: 03/28/2023 Start Time: 1430 End Time: 1500 Facilitators: Brantley Stage, OT   Group Description: Group encouraged increased participation and engagement through topic focused on sleep hygiene. Patients reflected on the quality of sleep they typically receive and identified areas that need improvement. Group was given background information on sleep and sleep hygiene, including common sleep disorders. Group members also received information on how to improve one's sleep and introduced a sleep diary as a tool that can be utilized to track sleep quality over a length of time. Group session ended with patients identifying one or more strategies they could utilize or implement into their sleep routine in order to improve overall sleep quality.        Therapeutic Goal(s):  Identify one or more strategies to improve overall sleep hygiene  Identify one or more areas of sleep that are negatively impacted (sleep too much, too little, etc)     Participation Level: Engaged   Participation Quality: Independent   Behavior: Cooperative   Speech/Thought Process: Relevant   Affect/Mood: Appropriate   Insight: Fair   Judgement: Good   Individualization: pt was engaged in their participation of group discussion/activity. New skills were identified  Modes of Intervention: Education  Patient Response to Interventions:  Attentive   Plan: Continue to engage patient in OT groups 2 - 3x/week.  03/28/2023  Brantley Stage, OT  Cornell Barman, OT

## 2023-03-28 NOTE — [facility] Group Notes (Signed)
Pt attended wrap up group  Pt rated day out 4 of 10.  Something Positive: Found a new friend that he can connect with on a personal level Goal for tomorrow: To make sure he takes medication

## 2023-03-29 DIAGNOSIS — F333 Major depressive disorder, recurrent, severe with psychotic symptoms: Secondary | ICD-10-CM | POA: Diagnosis not present

## 2023-03-29 LAB — HEMOGLOBIN A1C
Hgb A1c MFr Bld: 5.6 % (ref 4.8–5.6)
Mean Plasma Glucose: 114 mg/dL

## 2023-03-29 MED ORDER — FLUOXETINE HCL 20 MG PO CAPS
40.0000 mg | ORAL_CAPSULE | Freq: Every day | ORAL | Status: DC
  Administered 2023-03-30 – 2023-03-31 (×2): 40 mg via ORAL
  Filled 2023-03-29: qty 14
  Filled 2023-03-29 (×3): qty 2
  Filled 2023-03-29: qty 14

## 2023-03-29 MED ORDER — HYDROXYZINE HCL 25 MG PO TABS
25.0000 mg | ORAL_TABLET | Freq: Three times a day (TID) | ORAL | Status: DC | PRN
  Administered 2023-03-29: 25 mg via ORAL
  Filled 2023-03-29: qty 1

## 2023-03-29 MED ORDER — GABAPENTIN 300 MG PO CAPS
600.0000 mg | ORAL_CAPSULE | Freq: Three times a day (TID) | ORAL | Status: DC
  Administered 2023-03-29 – 2023-03-31 (×7): 600 mg via ORAL
  Filled 2023-03-29: qty 2
  Filled 2023-03-29: qty 42
  Filled 2023-03-29: qty 2
  Filled 2023-03-29 (×2): qty 42
  Filled 2023-03-29 (×3): qty 2
  Filled 2023-03-29: qty 42
  Filled 2023-03-29 (×2): qty 2
  Filled 2023-03-29: qty 42
  Filled 2023-03-29 (×2): qty 2
  Filled 2023-03-29: qty 42
  Filled 2023-03-29: qty 2

## 2023-03-29 NOTE — [facility] Group Notes (Signed)
[facility] Group Notes:  (Nursing/MHT/Case Management/Adjunct)  Date:  03/29/2023  Time:  1:58 PM  Type of Therapy:  Group Therapy  Participation Level:  Active  Participation Quality:  Appropriate  Affect:  Appropriate  Cognitive:  Appropriate  Insight:  Good  Engagement in Group:  Engaged  Modes of Intervention:  Discussion  Summary of Progress/Problems:  John Vaughan 03/29/2023, 1:58 PM

## 2023-03-29 NOTE — Progress Notes (Signed)
[facility]  03/29/2023 11:21 AM John DinningAaron Bradley Vaughan  MRN:  [id number]   Reason for Admission:  John Dinningaron Bradley Vaughan is a 32 y.o., male with a past psychiatric history significant for MDD, GAD, alcohol dependence, cocaine use disorder and benzodiazepine dependence who [facility] for evaluation and management of worsening depression and SI.  According to outside records, the patient [facility] reporting body aches, SI with no plan.The patient is currently on Hospital Day 3.   Chart Review from last 24 hours:  The patient's chart was reviewed and nursing notes were reviewed. The patient's case was discussed in multidisciplinary team meeting. [facility] with medications on the unit with no issues reported no as needed medication for agitation or aggression needed was given.  Staff report patient this morning to have bright fluent affect, CIWA score down to 0 yesterday and today Information Obtained Today During Patient Interview: The patient was seen and evaluated on the unit.  Patient continues to report improved mood and anxiety since admission but continues to report on and off depressed mood, discussed titrating Prozac to 40 mg daily, patient agrees.  He reports on and off passive SI wishing self dead last time this morning but denies active SI intention or plan since yesterday morning.  He denies auditory or visual hallucination for at least 2 days continues to report depressed mood 6-7 out of 1010 being the worst.  She continues to identify craving to alcohol and we discussed titrating gabapentin to 600 mg to address craving, patient agrees.  She reports good sleep and improving appetite, attending groups and reports improving learning coping skills to address stressors and craving after discharge.  He notes his mother informed him this morning that she is back at the house and was able to take care of the infection process, he  is excited about being able to go back home with mother after discharge. Sleep  Sleep: Patient reports good sleep   Principal Problem: MDD (major depressive disorder), recurrent, severe, with psychosis Diagnosis: Principal Problem:   MDD (major depressive disorder), recurrent, severe, with psychosis    Past Psychiatric History: Prior Psychiatric diagnoses: Depression, anxiety, alcohol dependence, benzodiazepine dependence and cocaine use Past Psychiatric Hospitalizations: Multiple hospitalizations to different facilities [facility]   History of self mutilation: History of cutting since teenage years, last time 2 years ago Past suicide attempts: Patient reports 4 previous suicide attempts last time 3 years ago when had a gun held to his head and pulled the trigger "but it got stuck" Past history of HI, violent or aggressive behavior: Denies   Past Psychiatric medications trials: Abilify questionable help in the high cost, BuSpar did not help but most recent dose, Cymbalta not helpful, gabapentin helpful for back pain and mood, Vistaril helpful on and off for anxiety, trazodone helpful for sleep and dose of 150 mg at bedtime, history of trying Remeron in the past.  Also patient reports being prescribed Klonopin 1 year ago for anxiety but no records found on PDMP History of ECT/TMS: Denies   Outpatient psychiatric Follow up: None recently last time years ago Prior Outpatient Therapy: None recently last time years ago  Past Medical History:  Past Medical History:  Diagnosis Date   Alcohol abuse    Anxiety    Bipolar 2 disorder    Depression    Seizures     Past Surgical History:  Procedure  Laterality Date   HAND SURGERY Right    Family History:  Family History  Problem Relation Age of Onset   Anxiety disorder Mother    Alcohol abuse Father    Anxiety disorder Maternal Aunt    Anxiety disorder Maternal Grandmother    Alcohol abuse Paternal Grandfather     Family Psychiatric  History: Psychiatric illness: Reports multiple family members on both sides of the family was diagnosed with bipolar disorder Suicide: Denies Substance Abuse: Reports multiple family members on both sides of the family with alcohol and cocaine use Social History: Living situation: Lives with his mother but notes house is being foreclosed on today, unsure where he will go after discharge Social support: Limited Marital Status: Never married Children: 30 years old son lives with his mother patient is unaware where to both of them live, have not seen him for the past few years Education: High school education Employment: Reports currently working part-time as a [employer] "under the table", applying for disability for mental illness Military service: Denies Legal history: Denies pending charges or court dates Trauma: Reports trauma related to witnessing his father's death otherwise none Access to guns: Denies  Current Medications: Current Facility-Administered Medications  Medication Dose Route Frequency Provider Last Rate Last Admin   acetaminophen (TYLENOL) tablet 650 mg  650 mg Oral Q6H PRN Eligha Bridegroomoleman, Mikaela, NP       alum & mag hydroxide-simeth (MAALOX/MYLANTA) 200-200-20 MG/5ML suspension 30 mL  30 mL Oral Q4H PRN Eligha Bridegroomoleman, Mikaela, NP   30 mL at 03/27/23 1817   busPIRone (BUSPAR) tablet 15 mg  15 mg Oral TID Sarita BottomAttiah, Kayela Humphres, MD   15 mg at 03/29/23 0759   diphenhydrAMINE (BENADRYL) capsule 50 mg  50 mg Oral TID PRN Eligha Bridegroomoleman, Mikaela, NP       Or   diphenhydrAMINE (BENADRYL) injection 50 mg  50 mg Intramuscular TID PRN Eligha Bridegroomoleman, Mikaela, NP       [START ON 03/30/2023] FLUoxetine (PROZAC) capsule 40 mg  40 mg Oral Daily Emanuell Morina, MD       gabapentin (NEURONTIN) capsule 600 mg  600 mg Oral TID Abbott PaoAttiah, Chanon Loney, MD       haloperidol (HALDOL) tablet 2 mg  2 mg Oral Daily Kaysan Peixoto, MD   2 mg at 03/29/23 0800   haloperidol (HALDOL) tablet 5 mg  5 mg Oral TID PRN Eligha Bridegroomoleman,  Mikaela, NP       Or   haloperidol lactate (HALDOL) injection 5 mg  5 mg Intramuscular TID PRN Eligha Bridegroomoleman, Mikaela, NP       levETIRAcetam (KEPPRA) tablet 500 mg  500 mg Oral BID Eligha Bridegroomoleman, Mikaela, NP   500 mg at 03/29/23 0759   LORazepam (ATIVAN) tablet 2 mg  2 mg Oral TID PRN Eligha Bridegroomoleman, Mikaela, NP       Or   LORazepam (ATIVAN) injection 2 mg  2 mg Intramuscular TID PRN Eligha Bridegroomoleman, Mikaela, NP       LORazepam (ATIVAN) tablet 0.5 mg  0.5 mg Oral BID Abbott PaoAttiah, Kayn Haymore, MD   0.5 mg at 03/29/23 0804   magnesium hydroxide (MILK OF MAGNESIA) suspension 30 mL  30 mL Oral Daily PRN Eligha Bridegroomoleman, Mikaela, NP       multivitamin with minerals tablet 1 tablet  1 tablet Oral Daily Eligha Bridegroomoleman, Mikaela, NP   1 tablet at 03/29/23 0759   nicotine (NICODERM CQ - dosed in mg/24 hours) patch 21 mg  21 mg Transdermal Daily Eligha Bridegroomoleman, Mikaela, NP   21 mg at 03/29/23 [id number]  pantoprazole (PROTONIX) EC tablet 40 mg  40 mg Oral Daily Eligha Bridegroom, NP   40 mg at 03/29/23 0606   thiamine (Vitamin B-1) tablet 100 mg  100 mg Oral Daily Eligha Bridegroom, NP   100 mg at 03/29/23 0801   traZODone (DESYREL) tablet 150 mg  150 mg Oral Dorthey Sawyer, NP   150 mg at 03/28/23 2105    Lab Results:  Results for orders placed or performed during the hospital encounter of 03/26/23 (from the past 48 hour(s))  TSH     Status: None   Collection Time: 03/28/23  6:44 AM  Result Value Ref Range   TSH 1.641 0.350 - 4.500 uIU/mL    Comment: Performed by a 3rd Generation assay with a functional sensitivity of <=0.01 uIU/mL. Performed at [facility], [address] [place], [place] [address]   Hemoglobin A1c     Status: None   Collection Time: 03/28/23  6:44 AM  Result Value Ref Range   Hgb A1c MFr Bld 5.6 4.8 - 5.6 %    Comment: (NOTE)         Prediabetes: 5.7 - 6.4         Diabetes: >6.4         Glycemic control for adults with diabetes: <7.0    Mean Plasma Glucose 114 mg/dL    Comment: (NOTE) Performed At: [facility]  [address] [place], [place] [id number] Jolene Schimke MD [id number]   Lipid panel     Status: Abnormal   Collection Time: 03/28/23  6:44 AM  Result Value Ref Range   Cholesterol 199 0 - 200 mg/dL   Triglycerides 308 (H) <150 mg/dL   HDL 64 >65 mg/dL   Total CHOL/HDL Ratio 3.1 RATIO   VLDL 31 0 - 40 mg/dL   LDL Cholesterol 784 (H) 0 - 99 mg/dL    Comment:        Total Cholesterol/HDL:CHD Risk Coronary Heart Disease Risk Table                     Men   Women  1/2 Average Risk   3.4   3.3  Average Risk       5.0   4.4  2 X Average Risk   9.6   7.1  3 X Average Risk  23.4   11.0        Use the calculated Patient Ratio above and the CHD Risk Table to determine the patient's CHD Risk.        ATP III CLASSIFICATION (LDL):  <100     mg/dL   Optimal  696-295  mg/dL   Near or Above                    Optimal  130-159  mg/dL   Borderline  284-132  mg/dL   High  >440     mg/dL   Very High Performed at [facility], [address], [place], [place] [address]     Blood Alcohol level:  Lab Results  Component Value Date   ETH <10 03/25/2023   ETH 294 (H) 01/20/2023    Metabolic Disorder Labs: Lab Results  Component Value Date   HGBA1C 5.6 03/28/2023   MPG 114 03/28/2023   MPG 108.28 05/14/2022   No results found for: "PROLACTIN" Lab Results  Component Value Date   CHOL 199 03/28/2023   TRIG 157 (H) 03/28/2023   HDL  64 03/28/2023   CHOLHDL 3.1 03/28/2023   VLDL 31 03/28/2023   LDLCALC 104 (H) 03/28/2023   LDLCALC 120 (H) 05/14/2022    Physical Findings: AIMS: Facial and Oral Movements Muscles of Facial Expression: None, normal Lips and Perioral Area: None, normal Jaw: None, normal Tongue: None, normal,Extremity Movements Upper (arms, wrists, hands, fingers): None, normal Lower (legs, knees, ankles, toes): None, normal, Trunk Movements Neck, shoulders, hips: None, normal, Overall Severity Severity of abnormal movements (highest  score from questions above): None, normal Incapacitation due to abnormal movements: None, normal Patient's awareness of abnormal movements (rate only patient's report): No Awareness, Dental Status Current problems with teeth and/or dentures?: No Does patient usually wear dentures?: No  CIWA:  CIWA-Ar Total: 0 COWS:     Musculoskeletal: Strength & Muscle Tone: within normal limits Gait & Station: normal Patient leans: N/A  Psychiatric Specialty Exam:  General Appearance: Appears at stated age, casually dressed, disheveled and unkempt   Behavior: Cooperative and pleasant, more fluent than yesterday   Psychomotor Activity: Noted to moderate agitation or retardation noted   Eye Contact: Improved, fair Speech: More fluent, normal tone and volume     Mood: Much less dysphoric Affect: More fluent affect, improving   Thought Process: Linear and goal-directed Descriptions of Associations: intact Thought Content: Hallucinations: Denies AVH and does not appear responding to stimuli Delusions: No paranoia  Suicidal Thoughts: Reports passive SI wishing self dead earlier this morning but denies active SI, intention, plan  Homicidal Thoughts: Denies HI, intention, plan    Alertness/Orientation: Alert and fully oriented   Insight: Improved yet limited Judgment: Improved yet limited   Memory: Intact   Executive Functions  Concentration: Intact Attention Span: Fair Recall: Intact Fund of Knowledge: Fair  Assets  Assets: Manufacturing systems engineer; Desire for Improvement; Financial Resources/Insurance; Housing; Physical Health; Resilience; Social Support    Physical Exam: Physical Exam Vitals and nursing note reviewed.    Review of Systems  All other systems reviewed and are negative.  Blood pressure 106/69, pulse 74, temperature (!) 97.3 F (36.3 C), temperature source Oral, resp. rate 18, height 6' (1.829 m), weight 92.6 kg, SpO2 99 %. Body mass index is 27.69  kg/m.   Treatment Plan Summary: Daily contact with patient to assess and evaluate symptoms and progress in treatment and Medication management  ASSESSMENT:  Diagnoses / Active Problems: Principal Problem: MDD (major depressive disorder), recurrent, severe, with psychosis Diagnosis: Principal Problem:   MDD (major depressive disorder), recurrent, severe, with psychosis   PLAN: Safety and Monitoring:  -- Voluntary admission to inpatient psychiatric unit for safety, stabilization and treatment  -- Daily contact with patient to assess and evaluate symptoms and progress in treatment  -- Patient's case to be discussed in multi-disciplinary team meeting  -- Observation Level : q15 minute checks  -- Vital signs:  q12 hours  -- Precautions: suicide, elopement, and assault  2. Medications:   Patient reports lack of compliance with outpatient medication secondary to cost issues, discussed with patient following the changes to improve compliance and decrease cost of medications.             Cymbalta was discontinued on admission for lack of efficacy and cost             Titrate Prozac from 20 to 40 mg daily for depression and long-term treatment of anxiety             Continue BuSpar 15 mg 3 times daily for anxiety  Abilify was discontinued on admission secondary to cost issues             Continue Haldol 2 mg daily for psychosis and to augment antidepressant effect, monitor effects and safety             Continue Vistaril 25 mg as needed for anxiety             Continue trazodone 150 mg at bedtime for sleep given reported efficacy             Continue Protonix 40 mg daily for GERD             Continue Keppra 500 mg twice daily for seizure disorder reported and discussed with patient need to comply with outpatient follow-up with neurology/primary care provider for further management            Continue Ativan taper for alcohol withdrawals             Continue to monitor CIWA for  withdrawals             The risks/benefits/side-effects/alternatives to this medication were discussed in detail with the patient and time was given for questions. The patient consents to medication trial.    -- Metabolic profile and EKG monitoring obtained while on an atypical antipsychotic (BMI: Lipid Panel: HbgA1c: QTc:)      3. Pertinent labs: CMP no significant abnormalities noted, B12 normal, CBC no significant abnormalities noted, UA no UTI, tox screen positive for benzodiazepine and cocaine, alcohol level less than 10, EKG 4/1 QTc 448  TSH within normal level, lipid panel within normal level except for elevated LDL slightly 104, elevated triglyceride 157, hemoglobin A1c 5.6     Lab ordered: None   4. Tobacco Use Disorder  -- Nicotine patch 21mg /24 hours ordered  -- Smoking cessation encouraged  5. Group and Therapy: -- Encouraged patient to participate in unit milieu and in scheduled group therapies     Was counseled regarding need to abstain completely from alcohol cocaine and any further treatment with benzodiazepine after discharge   -- Short Term Goals: Ability to identify changes in lifestyle to reduce recurrence of condition will improve, Ability to verbalize feelings will improve, Ability to disclose and discuss suicidal ideas, Ability to demonstrate self-control will improve, and Ability to identify and develop effective coping behaviors will improve  -- Long Term Goals: Improvement in symptoms so as ready for discharge  6. Discharge Planning:   -- Social work and case management to assist with discharge planning and identification of hospital follow-up needs prior to discharge  -- Estimated LOS: 5-7 days  -- Discharge Concerns: Need to establish a safety plan; Medication compliance and effectiveness  -- Discharge Goals: Return home with outpatient referrals for mental health follow-up including medication management/psychotherapy  Discussed with patient recommendation  for inpatient substance use rehab referral, he continues to refuse inpatient residential rehab but agrees to comply with IOP referral after discharge, will follow.    Total Time Spent in Direct Patient Care:  I personally spent 35 minutes on the unit in direct patient care. The direct patient care time included face-to-face time with the patient, reviewing the patient's chart, communicating with other professionals, and coordinating care. Greater than 50% of this time was spent in counseling or coordinating care with the patient regarding goals of hospitalization, psycho-education, and discharge planning needs.   Cairo Agostinelli Abbott Pao, MD 03/29/2023, 11:21 AM

## 2023-03-29 NOTE — Progress Notes (Signed)
   03/29/23 1000  Psych Admission Type (Psych Patients Only)  Admission Status Voluntary  Psychosocial Assessment  Patient Complaints Depression  Eye Contact Fair  Facial Expression Blank  Affect Appropriate to circumstance  Speech Logical/coherent  Interaction Assertive  Motor Activity Slow  Appearance/Hygiene Unremarkable  Behavior Characteristics Cooperative  Mood Pleasant  Thought Process  Coherency WDL  Content WDL  Delusions WDL  Perception WDL  Hallucination None reported or observed  Judgment Impaired  Confusion None  Danger to Self  Current suicidal ideation? Denies  Danger to Others  Danger to Others None reported or observed

## 2023-03-29 NOTE — Group Note (Signed)
Recreation Therapy Group Note   Group Topic:Team Building  Group Date: 03/29/2023 Start Time: 0930 End Time: 1002 Facilitators: Robbie Nangle-McCall, LRT,CTRS Location: [address] Dayroom   Goal Area(s) Addresses:  Patient will effectively work with peer towards shared goal.  Patient will identify skills used to make activity successful.  Patient will identify how skills used during activity can be used to reach post d/c goals.    Group Description: Landing Pad. In teams of 3-5, patients were given 12 plastic drinking straws and an equal length of masking tape. Using the materials provided, patients were asked to build a landing pad to catch a golf ball dropped from approximately 5 feet in the air. All materials were required to be used by the team in their design. LRT facilitated post-activity discussion.   Affect/Mood: Appropriate   Participation Level: Engaged   Participation Quality: Independent   Behavior: Appropriate   Speech/Thought Process: Focused   Insight: Good   Judgement: Good   Modes of Intervention: STEM Activity   Patient Response to Interventions:  Engaged   Education Outcome:  In group clarification offered    Clinical Observations/Individualized Feedback: Pt attended and was very engaged with peers.  Pt shared ideas with peers in completing activity.  Pt was bright and appropriate throughout.    Plan: Continue to engage patient in RT group sessions 2-3x/week.   Izaah Westman-McCall, LRT,CTRS  03/29/2023 11:39 AM

## 2023-03-29 NOTE — [facility] Group Notes (Signed)
[facility] Group Notes:  (Nursing/MHT/Case Management/Adjunct)  Date:  03/29/2023  Time:  10:13 AM  Type of Therapy:  Group Therapy  Participation Level:  Active  Participation Quality:  Appropriate  Affect:  Appropriate  Cognitive:  Appropriate  Insight:  Good  Engagement in Group:  Engaged  Modes of Intervention:  Discussion  Summary of Progress/Problems:  John Vaughan 03/29/2023, 10:13 AM

## 2023-03-29 NOTE — Progress Notes (Signed)
   03/29/23 1945  Psych Admission Type (Psych Patients Only)  Admission Status Voluntary  Psychosocial Assessment  Patient Complaints Depression  Eye Contact Fair  Facial Expression Anxious  Affect Appropriate to circumstance  Speech Logical/coherent  Interaction Assertive  Motor Activity Slow  Appearance/Hygiene Unremarkable  Behavior Characteristics Cooperative  Mood Pleasant  Aggressive Behavior  Effect No apparent injury  Thought Process  Coherency WDL  Content WDL  Delusions WDL  Perception Hallucinations  Hallucination Auditory  Judgment Impaired  Confusion None  Danger to Self  Current suicidal ideation? Denies  Danger to Others  Danger to Others None reported or observed

## 2023-03-30 DIAGNOSIS — F333 Major depressive disorder, recurrent, severe with psychotic symptoms: Secondary | ICD-10-CM | POA: Diagnosis not present

## 2023-03-30 NOTE — Progress Notes (Signed)
   03/30/23 1300  Psych Admission Type (Psych Patients Only)  Admission Status Voluntary  Psychosocial Assessment  Patient Complaints Depression  Eye Contact Fair  Facial Expression Anxious  Affect Appropriate to circumstance  Speech Logical/coherent  Interaction Assertive  Motor Activity Slow  Appearance/Hygiene Unremarkable  Behavior Characteristics Cooperative  Mood Pleasant  Thought Process  Coherency WDL  Content WDL  Delusions WDL  Perception WDL  Hallucination None reported or observed  Judgment Impaired  Confusion None  Danger to Self  Current suicidal ideation? Denies  Danger to Others  Danger to Others None reported or observed

## 2023-03-30 NOTE — Progress Notes (Signed)
[facility]  03/30/2023 9:39 AM John DinningAaron Bradley Vaughan  MRN:  [id number]   Reason for Admission:  John Vaughan is a 32 y.o., male with a past psychiatric history significant for MDD, GAD, alcohol dependence, cocaine use disorder and benzodiazepine dependence who [facility] for evaluation and management of worsening depression and SI.  According to outside records, the [facility] reporting body aches, SI with no plan.The patient is currently on Hospital Day 4.   Chart Review from last 24 hours:  The patient's chart was reviewed and nursing notes were reviewed. The patient's case was discussed in multidisciplinary team meeting. [facility] with medications on the unit with no issues reported no as needed medication for agitation or aggression needed was given.  Staff report patient this morning to have bright fluent affect, CIWA score down to 0 yesterday and today Information Obtained Today During Patient Interview: The patient was seen and evaluated on the unit.  Patient continues to report improved stable mood in fact this morning he denies any feeling depressed or anxious "my mood is amazing this morning" attending groups and participating denies any more craving, compliant with medications and denies side effects, continues to deny passive or active SI intention or plan, denies HI or AVH.  Agrees to comply with recommendation to abstain completely from alcohol after discharge continues to refuse inpatient residential rehab but continues to be in agreement with IOP and outpatient individual counseling.  Patient reports fair sleep and appetite. Sleep  Sleep: Patient reports good sleep   Principal Problem: MDD (major depressive disorder), recurrent, severe, with psychosis Diagnosis: Principal Problem:   MDD (major depressive disorder), recurrent, severe, with psychosis    Past Psychiatric History: Prior  Psychiatric diagnoses: Depression, anxiety, alcohol dependence, benzodiazepine dependence and cocaine use Past Psychiatric Hospitalizations: Multiple hospitalizations to different facilities [facility]   History of self mutilation: History of cutting since teenage years, last time 2 years ago Past suicide attempts: Patient reports 4 previous suicide attempts last time 3 years ago when had a gun held to his head and pulled the trigger "but it got stuck" Past history of HI, violent or aggressive behavior: Denies   Past Psychiatric medications trials: Abilify questionable help in the high cost, BuSpar did not help but most recent dose, Cymbalta not helpful, gabapentin helpful for back pain and mood, Vistaril helpful on and off for anxiety, trazodone helpful for sleep and dose of 150 mg at bedtime, history of trying Remeron in the past.  Also patient reports being prescribed Klonopin 1 year ago for anxiety but no records found on PDMP History of ECT/TMS: Denies   Outpatient psychiatric Follow up: None recently last time years ago Prior Outpatient Therapy: None recently last time years ago  Past Medical History:  Past Medical History:  Diagnosis Date   Alcohol abuse    Anxiety    Bipolar 2 disorder    Depression    Seizures     Past Surgical History:  Procedure Laterality Date   HAND SURGERY Right    Family History:  Family History  Problem Relation Age of Onset   Anxiety disorder Mother    Alcohol abuse Father    Anxiety disorder Maternal Aunt    Anxiety disorder Maternal Grandmother    Alcohol abuse Paternal Grandfather    Family Psychiatric  History: Psychiatric illness: Reports multiple family members on both sides of  the family was diagnosed with bipolar disorder Suicide: Denies Substance Abuse: Reports multiple family members on both sides of the family with alcohol and cocaine use Social History: Living situation: Lives with his mother but notes house is  being foreclosed on today, unsure where he will go after discharge Social support: Limited Marital Status: Never married Children: 22 years old son lives with his mother patient is unaware where to both of them live, have not seen him for the past few years Education: High school education Employment: 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", applying for disability for mental illness Military service: Denies Legal history: Denies pending charges or court dates Trauma: Reports trauma related to witnessing his father's death otherwise none Access to guns: Denies  Current Medications: Current Facility-Administered Medications  Medication Dose Route Frequency Provider Last Rate Last Admin   acetaminophen (TYLENOL) tablet 650 mg  650 mg Oral Q6H PRN Eligha Bridegroom, NP       alum & mag hydroxide-simeth (MAALOX/MYLANTA) 200-200-20 MG/5ML suspension 30 mL  30 mL Oral Q4H PRN Eligha Bridegroom, NP   30 mL at 03/27/23 1817   busPIRone (BUSPAR) tablet 15 mg  15 mg Oral TID Sarita Bottom, MD   15 mg at 03/30/23 0726   diphenhydrAMINE (BENADRYL) capsule 50 mg  50 mg Oral TID PRN Eligha Bridegroom, NP       Or   diphenhydrAMINE (BENADRYL) injection 50 mg  50 mg Intramuscular TID PRN Eligha Bridegroom, NP       FLUoxetine (PROZAC) capsule 40 mg  40 mg Oral Daily Sabrina Keough, MD   40 mg at 03/30/23 0726   gabapentin (NEURONTIN) capsule 600 mg  600 mg Oral TID Sarita Bottom, MD   600 mg at 03/30/23 0726   haloperidol (HALDOL) tablet 2 mg  2 mg Oral Daily Akire Rennert, MD   2 mg at 03/30/23 1610   haloperidol (HALDOL) tablet 5 mg  5 mg Oral TID PRN Eligha Bridegroom, NP       Or   haloperidol lactate (HALDOL) injection 5 mg  5 mg Intramuscular TID PRN Eligha Bridegroom, NP       hydrOXYzine (ATARAX) tablet 25 mg  25 mg Oral TID PRN Bobbitt, Shalon E, NP   25 mg at 03/29/23 2135   levETIRAcetam (KEPPRA) tablet 500 mg  500 mg Oral BID Eligha Bridegroom, NP   500 mg at 03/30/23 9604    LORazepam (ATIVAN) tablet 2 mg  2 mg Oral TID PRN Eligha Bridegroom, NP       Or   LORazepam (ATIVAN) injection 2 mg  2 mg Intramuscular TID PRN Eligha Bridegroom, NP       magnesium hydroxide (MILK OF MAGNESIA) suspension 30 mL  30 mL Oral Daily PRN Eligha Bridegroom, NP       multivitamin with minerals tablet 1 tablet  1 tablet Oral Daily Eligha Bridegroom, NP   1 tablet at 03/30/23 5409   nicotine (NICODERM CQ - dosed in mg/24 hours) patch 21 mg  21 mg Transdermal Daily Eligha Bridegroom, NP   21 mg at 03/30/23 0728   pantoprazole (PROTONIX) EC tablet 40 mg  40 mg Oral Daily Eligha Bridegroom, NP   40 mg at 03/30/23 8119   thiamine (Vitamin B-1) tablet 100 mg  100 mg Oral Daily Eligha Bridegroom, NP   100 mg at 03/30/23 0729   traZODone (DESYREL) tablet 150 mg  150 mg Oral QHS Sindy Guadeloupe, NP   150 mg at  03/29/23 2134    Lab Results:  No results found for this or any previous visit (from the past 48 hour(s)).   Blood Alcohol level:  Lab Results  Component Value Date   ETH <10 03/25/2023   ETH 294 (H) 01/20/2023    Metabolic Disorder Labs: Lab Results  Component Value Date   HGBA1C 5.6 03/28/2023   MPG 114 03/28/2023   MPG 108.28 05/14/2022   No results found for: "PROLACTIN" Lab Results  Component Value Date   CHOL 199 03/28/2023   TRIG 157 (H) 03/28/2023   HDL 64 03/28/2023   CHOLHDL 3.1 03/28/2023   VLDL 31 03/28/2023   LDLCALC 104 (H) 03/28/2023   LDLCALC 120 (H) 05/14/2022    Physical Findings: AIMS: Facial and Oral Movements Muscles of Facial Expression: None, normal Lips and Perioral Area: None, normal Jaw: None, normal Tongue: None, normal,Extremity Movements Upper (arms, wrists, hands, fingers): None, normal Lower (legs, knees, ankles, toes): None, normal, Trunk Movements Neck, shoulders, hips: None, normal, Overall Severity Severity of abnormal movements (highest score from questions above): None, normal Incapacitation due to abnormal movements: None,  normal Patient's awareness of abnormal movements (rate only patient's report): No Awareness, Dental Status Current problems with teeth and/or dentures?: No Does patient usually wear dentures?: No  CIWA:  CIWA-Ar Total: 1 COWS:     Musculoskeletal: Strength & Muscle Tone: within normal limits Gait & Station: normal Patient leans: N/A  Psychiatric Specialty Exam:  General Appearance: Appears at stated age, casually dressed, disheveled and unkempt   Behavior: Cooperative and pleasant, more fluent than yesterday   Psychomotor Activity: Noted to moderate agitation or retardation noted   Eye Contact: Improved, fair Speech: More fluent, normal tone and volume     Mood: Euthymic Affect: More fluent affect, congruent   Thought Process: Linear and goal-directed Descriptions of Associations: intact Thought Content: Hallucinations: Denies AVH and does not appear responding to stimuli Delusions: No paranoia  Suicidal Thoughts: Denies passive or active SI, intention, plan  Homicidal Thoughts: Denies HI, intention, plan    Alertness/Orientation: Alert and fully oriented   Insight: Improved, fair Judgment: Improved, fair   Memory: Intact   Executive Functions  Concentration: Intact Attention Span: Fair Recall: Intact Fund of Knowledge: Fair  Assets  Assets: Manufacturing systems engineer; Desire for Improvement; Financial Resources/Insurance; Housing; Physical Health; Resilience; Social Support    Physical Exam: Physical Exam Vitals and nursing note reviewed.    Review of Systems  All other systems reviewed and are negative.  Blood pressure 102/72, pulse 96, temperature 98 F (36.7 C), temperature source Oral, resp. rate 16, height 6' (1.829 m), weight 92.6 kg, SpO2 97 %. Body mass index is 27.69 kg/m.   Treatment Plan Summary: Daily contact with patient to assess and evaluate symptoms and progress in treatment and Medication management  ASSESSMENT:  Diagnoses / Active  Problems: Principal Problem: MDD (major depressive disorder), recurrent, severe, with psychosis Diagnosis: Principal Problem:   MDD (major depressive disorder), recurrent, severe, with psychosis   PLAN: Safety and Monitoring:  -- Voluntary admission to inpatient psychiatric unit for safety, stabilization and treatment  -- Daily contact with patient to assess and evaluate symptoms and progress in treatment  -- Patient's case to be discussed in multi-disciplinary team meeting  -- Observation Level : q15 minute checks  -- Vital signs:  q12 hours  -- Precautions: suicide, elopement, and assault  2. Medications:   Patient reports lack of compliance with outpatient medication secondary to cost issues, discussed with  patient following the changes to improve compliance and decrease cost of medications.             Cymbalta was discontinued on admission for lack of efficacy and cost            Continue Prozac 40 mg daily for depression and long-term treatment of anxiety             Continue BuSpar 15 mg 3 times daily for anxiety            Abilify was discontinued on admission secondary to cost issues             Continue Haldol 2 mg daily for psychosis and to augment antidepressant effect, monitor effects and safety             Continue Vistaril 25 mg as needed for anxiety             Continue trazodone 150 mg at bedtime for sleep given reported efficacy             Continue Protonix 40 mg daily for GERD  Continue gabapentin 600 mg 3 times daily for mood and anxiety as well as helpful for alcohol dependence and craving.             Continue Keppra 500 mg twice daily for seizure disorder reported and discussed with patient need to comply with outpatient follow-up with neurology/primary care provider for further management            Continue Ativan taper for alcohol withdrawals             Continue to monitor CIWA for withdrawals             The risks/benefits/side-effects/alternatives to this  medication were discussed in detail with the patient and time was given for questions. The patient consents to medication trial.    -- Metabolic profile and EKG monitoring obtained while on an atypical antipsychotic (BMI: Lipid Panel: HbgA1c: QTc:)      3. Pertinent labs: CMP no significant abnormalities noted, B12 normal, CBC no significant abnormalities noted, UA no UTI, tox screen positive for benzodiazepine and cocaine, alcohol level less than 10, EKG 4/1 QTc 448  TSH within normal level, lipid panel within normal level except for elevated LDL slightly 104, elevated triglyceride 157, hemoglobin A1c 5.6     Lab ordered: None   4. Tobacco Use Disorder  -- Nicotine patch 21mg /24 hours ordered  -- Smoking cessation encouraged  5. Group and Therapy: -- Encouraged patient to participate in unit milieu and in scheduled group therapies     Was counseled regarding need to abstain completely from alcohol cocaine and any further treatment with benzodiazepine after discharge   -- Short Term Goals: Ability to identify changes in lifestyle to reduce recurrence of condition will improve, Ability to verbalize feelings will improve, Ability to disclose and discuss suicidal ideas, Ability to demonstrate self-control will improve, and Ability to identify and develop effective coping behaviors will improve  -- Long Term Goals: Improvement in symptoms so as ready for discharge  6. Discharge Planning:   -- Social work and case management to assist with discharge planning and identification of hospital follow-up needs prior to discharge  -- Estimated LOS: 5-7 days  -- Discharge Concerns: Need to establish a safety plan; Medication compliance and effectiveness  -- Discharge Goals: Return home with outpatient referrals for mental health follow-up including medication management/psychotherapy  Discussed with patient recommendation for inpatient substance use  rehab referral, he continues to refuse inpatient  residential rehab but agrees to comply with IOP referral after discharge, will follow.    Total Time Spent in Direct Patient Care:  I personally spent 35 minutes on the unit in direct patient care. The direct patient care time included face-to-face time with the patient, reviewing the patient's chart, communicating with other professionals, and coordinating care. Greater than 50% of this time was spent in counseling or coordinating care with the patient regarding goals of hospitalization, psycho-education, and discharge planning needs.   Oluwatoyin Banales Abbott Pao, MD 03/30/2023, 9:39 AM

## 2023-03-30 NOTE — Progress Notes (Signed)
   03/30/23 2130  Psych Admission Type (Psych Patients Only)  Admission Status Voluntary  Psychosocial Assessment  Patient Complaints Depression  Eye Contact Brief  Facial Expression Flat  Affect Appropriate to circumstance;Depressed  Speech Logical/coherent  Interaction No initiation  Motor Activity Slow  Appearance/Hygiene Unremarkable  Behavior Characteristics Appropriate to situation  Mood Pleasant  Thought Process  Coherency WDL  Content WDL  Delusions None reported or observed  Perception WDL  Hallucination None reported or observed  Judgment Poor  Confusion None  Danger to Self  Current suicidal ideation? Denies  Danger to Others  Danger to Others None reported or observed

## 2023-03-30 NOTE — Progress Notes (Signed)
Psychoeducational Group Note  Date:  03/30/2023 Time:  2000  Group Topic/Focus:  Wrap up group  Participation Level: Did Not Attend  Participation Quality:  Not Applicable  Affect:  Not Applicable  Cognitive:  Not Applicable  Insight:  Not Applicable  Engagement in Group: Not Applicable  Additional Comments:  Did not attend.  Johann Capers S 03/30/2023, 9:02 PM

## 2023-03-31 DIAGNOSIS — F333 Major depressive disorder, recurrent, severe with psychotic symptoms: Secondary | ICD-10-CM

## 2023-03-31 MED ORDER — FLUOXETINE HCL 40 MG PO CAPS: 40.0000 mg | ORAL_CAPSULE | Freq: Every day | ORAL | 0 refills | Status: DC

## 2023-03-31 MED ORDER — HALOPERIDOL 2 MG PO TABS: 2.0000 mg | ORAL_TABLET | Freq: Every day | ORAL | 0 refills | Status: DC

## 2023-03-31 MED ORDER — HYDROXYZINE HCL 25 MG PO TABS: 25.0000 mg | ORAL_TABLET | Freq: Three times a day (TID) | ORAL | 0 refills | Status: DC | PRN

## 2023-03-31 MED ORDER — LEVETIRACETAM 500 MG PO TABS: 500.0000 mg | ORAL_TABLET | Freq: Two times a day (BID) | ORAL | 0 refills | Status: DC

## 2023-03-31 MED ORDER — GABAPENTIN 300 MG PO CAPS: 600.0000 mg | ORAL_CAPSULE | Freq: Three times a day (TID) | ORAL | 0 refills | Status: DC

## 2023-03-31 MED ORDER — PANTOPRAZOLE SODIUM 40 MG PO TBEC: 40.0000 mg | DELAYED_RELEASE_TABLET | Freq: Every day | ORAL | 0 refills | Status: DC

## 2023-03-31 MED ORDER — BUSPIRONE HCL 15 MG PO TABS: 15.0000 mg | ORAL_TABLET | Freq: Three times a day (TID) | ORAL | 0 refills | Status: DC

## 2023-03-31 MED ORDER — TRAZODONE HCL 150 MG PO TABS: 150.0000 mg | ORAL_TABLET | Freq: Every day | ORAL | 0 refills | Status: DC

## 2023-03-31 MED ORDER — NICOTINE 21 MG/24HR TD PT24: 21.0000 mg | MEDICATED_PATCH | Freq: Every day | TRANSDERMAL | 0 refills | Status: DC

## 2023-03-31 NOTE — Progress Notes (Signed)
   03/31/23 0950  SDOH Transportation Screening (Complete 2 screening rows OR Exclusion row)  In the past 12 months, has lack of transportation kept you from medical appointments or from getting medications? yes  In the past 12 months, has lack of transportation kept you from meetings, work, or from getting things needed for daily living? Yes  AMBULATORY ONLY: Non-Emergency Transportation Screening  1.  Is the Patient in the clinic and in need of immediate transportation? Yes   Therapist, nutritional signed, submitted to medical records  Ambrose Mantle, LCSW 03/31/2023, 9:51 AM

## 2023-03-31 NOTE — [facility] Suicide Risk Assessment (Signed)
[facility] INPATIENT:  Family/Significant Other Suicide Prevention Education  Suicide Prevention Education:  Education Completed; -John Vaughan (Mom) [phone],  (name of family member/significant other) has been identified by the patient as the family member/significant other with whom the patient will be residing, and identified as the person(s) who will aid the patient in the event of a mental health crisis (suicidal ideations/suicide attempt).  With written consent from the patient, the family member/significant other has been provided the following suicide prevention education, prior to the and/or following the discharge of the patient.  The suicide prevention education provided includes the following: Suicide risk factors Suicide prevention and interventions National Suicide Hotline telephone number [facility] assessment telephone number [facility] Emergency Assistance 911 [facility] and/or Residential Mobile Crisis Unit telephone number  Request made of family/significant other to: Remove weapons (e.g., guns, rifles, knives), all items previously/currently identified as safety concern.   Remove drugs/medications (over-the-counter, prescriptions, illicit drugs), all items previously/currently identified as a safety concern.  The family member/significant other verbalizes understanding of the suicide prevention education information provided.  The family member/significant other agrees to remove the items of safety concern listed above.  John Vaughan 03/31/2023, 9:52 AM Actual information provided by weekday staff on handoff

## 2023-03-31 NOTE — [facility] Suicide Risk Assessment (Signed)
[facility] Discharge Suicide Risk Assessment   Principal Problem: MDD (major depressive disorder), recurrent, severe, with psychosis Discharge Diagnoses: Principal Problem:   MDD (major depressive disorder), recurrent, severe, with psychosis   Total Time spent with patient: 45 minutes  Reason for admission: John Vaughan is a 32 y.o., male with a past psychiatric history significant for MDD, GAD, alcohol dependence, cocaine use disorder and benzodiazepine dependence who presents to the [facility] from [facility] ED for evaluation and management of worsening depression and SI.  According to outside records, the patient presented to [facility] reporting body aches, SI with no plan.  PTA Medications:          Medications Prior to Admission  Medication Sig Dispense Refill Last Dose   ARIPiprazole (ABILIFY) 5 MG tablet Take 1 tablet (5 mg total) by mouth daily. For mood stabilization 30 tablet 0     busPIRone (BUSPAR) 10 MG tablet Take 1 tablet (10 mg total) by mouth 2 (two) times daily. For anxiety 60 tablet 0     cyanocobalamin 100 MCG tablet Take 100 mcg by mouth daily.         DULoxetine (CYMBALTA) 60 MG capsule Take 1 capsule (60 mg total) by mouth daily. For depression 30 capsule 0     gabapentin (NEURONTIN) 400 MG capsule Take 1 capsule (400 mg total) by mouth 3 (three) times daily. For agitation 90 capsule 0     hydrOXYzine (ATARAX) 25 MG tablet Take 1 tablet (25 mg total) by mouth 3 (three) times daily as needed for anxiety (Sleep). 90 tablet 0     levETIRAcetam (KEPPRA) 500 MG tablet Take 1 tablet (500 mg total) by mouth 2 (two) times daily. For seizures 60 tablet 0     Multiple Vitamin (QUINTABS) TABS Take 1 tablet by mouth daily.         nicotine (NICODERM CQ - DOSED IN MG/24 HOURS) 21 mg/24hr patch Place 1 patch (21 mg total) onto the skin daily. (May buy from over the counter): For smoking cessation (Patient not taking: Reported on 03/25/2023) 1 patch 0     pantoprazole  (PROTONIX) 40 MG tablet Take 1 tablet (40 mg total) by mouth daily. For acid reflux 30 tablet 0     traZODone (DESYREL) 150 MG tablet Take 1 tablet (150 mg total) by mouth at bedtime. For sleep 30 tablet 0      Hospital Course:   During the patient's hospitalization, patient had extensive initial psychiatric evaluation, and follow-up psychiatric evaluations every day.  Psychiatric diagnoses provided upon initial assessment: MDD (major depressive disorder), recurrent, severe, with psychosis , alcohol use disorder, dependence with withdrawals.  Patient's psychiatric medications were adjusted on admission:  Patient reports lack of compliance with outpatient medication secondary to cost issues, discussed with patient following the changes to improve compliance and decrease cost of medications.             Discontinue Cymbalta for lack of efficacy and cost             Start trial of Prozac 20 mg daily for depression and long-term treatment of anxiety             Titrate BuSpar from 10 mg twice daily to 15 mg 3 times daily for anxiety             Discontinue Abilify secondary to cost issues             Start trial of Haldol 2 mg daily  for psychosis and to augment antidepressant effect, monitor effects and safety             Continue Vistaril 25 mg as needed for anxiety             Continue trazodone 150 mg at bedtime for sleep given reported efficacy             Continue Protonix 40 mg daily for GERD             Continue Keppra 500 mg twice daily for seizure disorder reported and discussed with patient need to comply with outpatient follow-up with neurology/primary care provider for further management             Start Ativan taper for the next 3 days for alcohol withdrawals             Continue to monitor CIWA for withdrawals             Discontinue Librium as needed with CIWA to avoid over presenting self-reported symptoms med seeking behavior for benzodiazepine  During the hospitalization, other  adjustments were made to the patient's psychiatric medication regimen: BuSpar was continued 50 mg 3 times daily for anxiety, Prozac was titrated from 20 to 40 mg for depression and anxiety, gabapentin was titrated from 400 mg to 600 mg 3 times daily for anxiety and alcohol dependence, Haldol was continued at 2 mg daily for psychotic symptoms and mood, Keppra was continued 500 mg twice daily for seizure disorder, trazodone was continued at 150 mg at bedtime for sleep.  Patient completed Ativan taper with no further withdrawals noted and CIWA improved significantly after first 48 hours.  Patient's care was discussed during the interdisciplinary team meeting every day during the hospitalization.  The patient denied having side effects to prescribed psychiatric medication.  Gradually, patient started adjusting to milieu. The patient was evaluated each day by a clinical provider to ascertain response to treatment. Improvement was noted by the patient's report of decreasing symptoms, improved sleep and appetite, affect, medication tolerance, behavior, and participation in unit programming.  Patient was asked each day to complete a self inventory noting mood, mental status, pain, new symptoms, anxiety and concerns.    Symptoms were reported as significantly decreased or resolved completely by discharge.   On day of discharge, patient was evaluated on 03/31/2023 the patient reports that their mood is stable. The patient denied having suicidal thoughts for more than 48 hours prior to discharge.  Patient denies having homicidal thoughts.  Patient denies having auditory hallucinations.  Patient denies any visual hallucinations or other symptoms of psychosis. The patient was motivated to continue taking medication with a goal of continued improvement in mental health.  During hospital stay patient refused referral to inpatient residential treatment for intensive outpatient treatment for alcohol use rehab he only agreed  with individual counseling and noted plan to attend AA meetings despite continuous encouragement for inpatient residential or IOP referral.  The patient reports their target psychiatric symptoms of depression and anxiety and SI responded well to the psychiatric medications, and the patient reports overall benefit other psychiatric hospitalization. Supportive psychotherapy was provided to the patient. The patient also participated in regular group therapy while hospitalized. Coping skills, problem solving as well as relaxation therapies were also part of the unit programming.  Labs were reviewed with the patient, and abnormal results were discussed with the patient.  The patient is able to verbalize their individual safety plan to this provider.  Behavioral  Events: None  Restraints: None  Groups: Attended and participated  Medications Changes: As above   Sleep  Sleep: Good, improved significantly during hospital stay  Musculoskeletal: Strength & Muscle Tone: within normal limits Gait & Station: normal Patient leans: N/A  Psychiatric Specialty Exam  General Appearance: appears at stated age, fairly dressed and groomed  Behavior: pleasant and cooperative  Psychomotor Activity:No psychomotor agitation or retardation noted   Eye Contact: good Speech: normal amount, tone, volume and latency   Mood: euthymic Affect: congruent, pleasant and interactive  Thought Process: linear, goal directed, no circumstantial or tangential thought process noted, no racing thoughts or flight of ideas Descriptions of Associations: intact Thought Content: Hallucinations: denies AH, VH , does not appear responding to stimuli Delusions: No paranoia or other delusions noted Suicidal Thoughts: denies SI, intention, plan  Homicidal Thoughts: denies HI, intention, plan   Alertness/Orientation: alert and fully oriented  Insight: fair, improved Judgment: fair, improved  Memory: intact  Executive  Functions  Concentration: intact  Attention Span: Fair Recall: intact Fund of Knowledge: fair   Art therapistxecutive Functions  Concentration: intact Attention Span: Fair Recall: intact Fund of Knowledge: fair   Assets  Assets: Manufacturing systems engineerCommunication Skills; Desire for Improvement; Financial Resources/Insurance; Housing; Physical Health; Resilience; Social Support   Physical Exam: Physical Exam ROS Blood pressure 109/73, pulse 92, temperature 97.7 F (36.5 C), temperature source Oral, resp. rate 18, height 6' (1.829 m), weight 92.6 kg, SpO2 99 %. Body mass index is 27.69 kg/m.  Mental Status Per Nursing Assessment::   On Admission:  NA  Demographic Factors:  Male, Caucasian, and Unemployed  Loss Factors: NA  Historical Factors: NA  Risk Reduction Factors:   Living with another person, especially a relative and Positive social support  Continued Clinical Symptoms: Symptoms improved significantly during hospital stay Depression:   Anhedonia Hopelessness Insomnia Alcohol/Substance Abuse/Dependencies  Cognitive Features That Contribute To Risk:  None    Suicide Risk:  Minimal: No identifiable suicidal ideation.  Patients presenting with no risk factors but with morbid ruminations; may be classified as minimal risk based on the severity of the depressive symptoms   Follow-up Information     [facility]   Specialty: Behavioral Health Why: You have an appointment for medication management services on 04/08/23 at 2:00 pm (this appointment will be  Virtual).   You also have an appointment for therapy services on  05/01/23 at 1:00 pm  (this appointment will be held In Person). Contact information: [address] [place] [id number] [phone]                Plan Of Care/Follow-up recommendations:   Discharge recommendations:     Activity: as tolerated  Diet: heart healthy  # It is recommended to the patient  to continue psychiatric medications as prescribed, after discharge from the hospital.     # It is recommended to the patient to follow up with your outpatient psychiatric provider and PCP.   # It was discussed with the patient, the impact of alcohol, drugs, tobacco have been there overall psychiatric and medical wellbeing, and total abstinence from substance use was recommended the patient.ed.   # Prescriptions provided or sent directly to preferred pharmacy at discharge. Patient agreeable to plan. Given opportunity to ask questions. Appears to feel comfortable with discharge.    # In the event of worsening symptoms, the patient is instructed to call the crisis hotline, 911 and or go to the  nearest ED for appropriate evaluation and treatment of symptoms. To follow-up with primary care provider for other medical issues, concerns and or health care needs   # Patient was discharged home with a plan to follow up as noted above.  -Follow-up with outpatient primary care doctor and other specialists -for management of chronic medical disease, including: patieny agrees to see PCP for mgt of seizure disorder, he was given resources for PCP in the area.   Patient agrees with D/C instructions and plan.  The patient received suicide prevention pamphlet:  Yes Belongings returned:  Clothing and Valuables  Total Time Spent in Direct Patient Care:  I personally spent 45 minutes on the unit in direct patient care. The direct patient care time included face-to-face time with the patient, reviewing the patient's chart, communicating with other professionals, and coordinating care. Greater than 50% of this time was spent in counseling or coordinating care with the patient regarding goals of hospitalization, psycho-education, and discharge planning needs.   Wm Sahagun 03/31/2023, 9:47 AM   Hau Sanor Abbott Pao, MD 03/31/2023, 9:47 AM

## 2023-03-31 NOTE — Progress Notes (Signed)
Discharge note: RN met with pt and reviewed pt's discharge instructions. Pt verbalized understanding of discharge instructions and pt did not have any questions. RN reviewed and provided pt with a copy of Suicide Safety Plan, SRA, AVS and Transition Record. RN returned pt's belongings to pt. Medication samples given to pt.  Pt denied SI/HI/AVH and voiced no concerns. Pt was appreciative of the care pt received at [facility]. Patient discharged to the lobby without incident.

## 2023-03-31 NOTE — Progress Notes (Signed)
  [facility] Adult Case Management Discharge Plan :  Will you be returning to the same living situation after discharge:  No.  Going to live with mother At discharge, do you have transportation home?: Yes,  ride arranged by CSW Do you have the ability to pay for your medications: No.  Needs help from community agency  Release of information consent forms completed and emailed to Medical Records, then turned in to Medical Records by CSW.   Patient to Follow up at:  Follow-up Information     [facility] Follow up on 04/08/2023.   Specialty: Behavioral Health Why: You have an appointment for medication management services on 04/08/23 at 2:00 pm (this appointment will be  Virtual).   You also have an appointment for therapy services on  05/01/23 at 1:00 pm  (this appointment will be held In Person). Contact information: [address] [place] [address] [phone]                Next level of care provider has access to [facility] Link:yes  Safety Planning and Suicide Prevention discussed: Yes,  with mother     Has patient been referred to the Quitline?: Patient refused referral  Patient has been referred for addiction treatment: Yes  Lynnell Chad, LCSW 03/31/2023, 9:21 AM

## 2023-03-31 NOTE — Discharge Summary (Signed)
Physician Discharge Summary Note  Patient:  John Vaughan is an 32 y.o., male MRN:  [id number] DOB:  [birth year] Patient phone:  [phone] (home)  Patient address:   [address] [place] [id number],  Total Time spent with patient: 45 minutes  Date of Admission:  03/26/2023 Date of Discharge: 03/31/2023  Reason for Admission:  John Vaughan is a 32 y.o., male with a past psychiatric history significant for MDD, GAD, alcohol dependence, cocaine use disorder and benzodiazepine dependence who [facility] for evaluation and management of worsening depression and SI.  According to outside records, [facility] body aches, SI with no plan.  Principal Problem: MDD (major depressive disorder), recurrent, severe, with psychosis Discharge Diagnoses: Principal Problem:   MDD (major depressive disorder), recurrent, severe, with psychosis   Past Psychiatric History:  Prior Psychiatric diagnoses: Depression, anxiety, alcohol dependence, benzodiazepine dependence and cocaine use Past Psychiatric Hospitalizations: Multiple hospitalizations to different facilities [facility]   History of self mutilation: History of cutting since teenage years, last time 2 years ago Past suicide attempts: Patient reports 4 previous suicide attempts last time 3 years ago when had a gun held to his head and pulled the trigger "but it got stuck" Past history of HI, violent or aggressive behavior: Denies   Past Psychiatric medications trials: Abilify questionable help in the high cost, BuSpar did not help but most recent dose, Cymbalta not helpful, gabapentin helpful for back pain and mood, Vistaril helpful on and off for anxiety, trazodone helpful for sleep and dose of 150 mg at bedtime, history of trying Remeron in the past.  Also patient reports being prescribed Klonopin 1 year ago for anxiety but no  records found on PDMP History of ECT/TMS: Denies   Outpatient psychiatric Follow up: None recently last time years ago Prior Outpatient Therapy: None recently last time years ago  Past Medical History:  Past Medical History:  Diagnosis Date   Alcohol abuse    Anxiety    Bipolar 2 disorder    Depression    Seizures     Past Surgical History:  Procedure Laterality Date   HAND SURGERY Right     Family History:  Family History  Problem Relation Age of Onset   Anxiety disorder Mother    Alcohol abuse Father    Anxiety disorder Maternal Aunt    Anxiety disorder Maternal Grandmother    Alcohol abuse Paternal Grandfather    Family Psychiatric  History:  Psychiatric illness: Reports multiple family members on both sides of the family was diagnosed with bipolar disorder Suicide: Denies Substance Abuse: Reports multiple family members on both sides of the family with alcohol and cocaine use Social History:  Social History   Substance and Sexual Activity  Alcohol Use Yes   Alcohol/week: 2.0 standard drinks of alcohol   Types: 2 Cans of beer per week   Comment: 48 beers a week     Social History   Substance and Sexual Activity  Drug Use Yes   Types: Cocaine    Social History   Socioeconomic History   Marital status: Single    Spouse name: Not on file   Number of children: Not on file   Years of education: Not on file   Highest education level: Not on file  Occupational History   Not on file  Tobacco Use   Smoking status: Every Day  Packs/day: 0.25    Years: 15.00    Additional pack years: 0.00    Total pack years: 3.75    Types: Cigarettes   Smokeless tobacco: Former    Types: Snuff   Tobacco comments:    Pt states plans to stop drinking. Will deal with tobacco cessation later.  Vaping Use   Vaping Use: Never used  Substance and Sexual Activity   Alcohol use: Yes    Alcohol/week: 2.0 standard drinks of alcohol    Types: 2 Cans of beer per week     Comment: 48 beers a week   Drug use: Yes    Types: Cocaine   Sexual activity: Not Currently  Other Topics Concern   Not on file  Social History Narrative   Not on file   Social Determinants of Health   Financial Resource Strain: Not on file  Food Insecurity: Food Insecurity Present (03/26/2023)   Hunger Vital Sign    Worried About Running Out of Food in the Last Year: Often true    Ran Out of Food in the Last Year: Often true  Transportation Needs: Unmet Transportation Needs (03/31/2023)   PRAPARE - Administrator, Civil Service (Medical): Yes    Lack of Transportation (Non-Medical): Yes  Physical Activity: Not on file  Stress: Not on file  Social Connections: Not on file   Living situation: Lives with his mother but notes house is being foreclosed on today, unsure where he will go after discharge Social support: Limited Marital Status: Never married Children: 74 years old son lives with his mother patient is unaware where to both of them live, have not seen him for the past few years Education: High school education Employment: 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", applying for disability for mental illness Military service: Denies Legal history: Denies pending charges or court dates Trauma: Reports trauma related to witnessing his father's death otherwise none Access to guns: Denies  Substance Use History:  Alcohol: Drinking alcohol 4 times weekly average 340 ounce beers each time history of withdrawals, history of withdrawal seizures and tremors, last withdrawal seizures reported 1 year ago, denies history of DTs Tobacoo: Less than half pack of cigarette daily Marijuana: Denies Cocaine: Admits to using cocaine every 1 to 2 weeks last use last Friday Stimulants: Denies IV drug use: Denies Opiates: Denies Prescribed Meds abuse: He tested positive for benzodiazepine and reports was prescribed Klonopin 1 year ago but his report is  inconsistent with his PDMP report when I only found 1 prescription of Librium for 10 tablets in March 2022 H/O withdrawals, blackouts, DTs: History of withdrawal tremors and withdrawal seizures, history of blackouts but denies history of DTs History of Detox / Rehab: History of inpatient rehab once at [facility] for 30 days September 2023 DUI: Denies  Hospital Course:   During the patient's hospitalization, patient had extensive initial psychiatric evaluation, and follow-up psychiatric evaluations every day.   Psychiatric diagnoses provided upon initial assessment: MDD (major depressive disorder), recurrent, severe, with psychosis , alcohol use disorder, dependence with withdrawals.   Patient's psychiatric medications were adjusted on admission:  Patient reports lack of compliance with outpatient medication secondary to cost issues, discussed with patient following the changes to improve compliance and decrease cost of medications.             Discontinue Cymbalta for lack of efficacy and cost             Start trial  of Prozac 20 mg daily for depression and long-term treatment of anxiety             Titrate BuSpar from 10 mg twice daily to 15 mg 3 times daily for anxiety             Discontinue Abilify secondary to cost issues             Start trial of Haldol 2 mg daily for psychosis and to augment antidepressant effect, monitor effects and safety             Continue Vistaril 25 mg as needed for anxiety             Continue trazodone 150 mg at bedtime for sleep given reported efficacy             Continue Protonix 40 mg daily for GERD             Continue Keppra 500 mg twice daily for seizure disorder reported and discussed with patient need to comply with outpatient follow-up with neurology/primary care provider for further management             Start Ativan taper for the next 3 days for alcohol withdrawals             Continue to monitor CIWA for withdrawals             Discontinue Librium as  needed with CIWA to avoid over presenting self-reported symptoms med seeking behavior for benzodiazepine   During the hospitalization, other adjustments were made to the patient's psychiatric medication regimen: BuSpar was continued 50 mg 3 times daily for anxiety, Prozac was titrated from 20 to 40 mg for depression and anxiety, gabapentin was titrated from 400 mg to 600 mg 3 times daily for anxiety and alcohol dependence, Haldol was continued at 2 mg daily for psychotic symptoms and mood, Keppra was continued 500 mg twice daily for seizure disorder, trazodone was continued at 150 mg at bedtime for sleep.  Patient completed Ativan taper with no further withdrawals noted and CIWA improved significantly after first 48 hours.   Patient's care was discussed during the interdisciplinary team meeting every day during the hospitalization.   The patient denied having side effects to prescribed psychiatric medication.   Gradually, patient started adjusting to milieu. The patient was evaluated each day by a clinical provider to ascertain response to treatment. Improvement was noted by the patient's report of decreasing symptoms, improved sleep and appetite, affect, medication tolerance, behavior, and participation in unit programming.  Patient was asked each day to complete a self inventory noting mood, mental status, pain, new symptoms, anxiety and concerns.     Symptoms were reported as significantly decreased or resolved completely by discharge.    On day of discharge, patient was evaluated on 03/31/2023 the patient reports that their mood is stable. The patient denied having suicidal thoughts for more than 48 hours prior to discharge.  Patient denies having homicidal thoughts.  Patient denies having auditory hallucinations.  Patient denies any visual hallucinations or other symptoms of psychosis. The patient was motivated to continue taking medication with a goal of continued improvement in mental health.   During hospital stay patient refused referral to inpatient residential treatment for intensive outpatient treatment for alcohol use rehab he only agreed with individual counseling and noted plan to attend AA meetings despite continuous encouragement for inpatient residential or IOP referral.   The patient reports their target psychiatric symptoms of depression  and anxiety and SI responded well to the psychiatric medications, and the patient reports overall benefit other psychiatric hospitalization. Supportive psychotherapy was provided to the patient. The patient also participated in regular group therapy while hospitalized. Coping skills, problem solving as well as relaxation therapies were also part of the unit programming.   Labs were reviewed with the patient, and abnormal results were discussed with the patient.   The patient is able to verbalize their individual safety plan to this provider.   Behavioral Events: None   Restraints: None   Groups: Attended and participated   Medications Changes: As above     Sleep  Sleep: Good, improved significantly during hospital stay    Physical Findings: AIMS: Facial and Oral Movements Muscles of Facial Expression: None, normal Lips and Perioral Area: None, normal Jaw: None, normal Tongue: None, normal,Extremity Movements Upper (arms, wrists, hands, fingers): None, normal Lower (legs, knees, ankles, toes): None, normal, Trunk Movements Neck, shoulders, hips: None, normal, Overall Severity Severity of abnormal movements (highest score from questions above): None, normal Incapacitation due to abnormal movements: None, normal Patient's awareness of abnormal movements (rate only patient's report): No Awareness, Dental Status Current problems with teeth and/or dentures?: No Does patient usually wear dentures?: No  CIWA:  CIWA-Ar Total: 0 COWS:     Musculoskeletal: Strength & Muscle Tone: within normal limits Gait & Station:  normal Patient leans: N/A   Psychiatric Specialty Exam:  General Appearance: appears at stated age, fairly dressed and groomed  Behavior: pleasant and cooperative  Psychomotor Activity:No psychomotor agitation or retardation noted   Eye Contact: good Speech: normal amount, tone, volume and latency   Mood: euthymic Affect: congruent, pleasant and interactive  Thought Process: linear, goal directed, no circumstantial or tangential thought process noted, no racing thoughts or flight of ideas Descriptions of Associations: intact Thought Content: Hallucinations: denies AH, VH , does not appear responding to stimuli Delusions: No paranoia or other delusions noted Suicidal Thoughts: denies SI, intention, plan  Homicidal Thoughts: denies HI, intention, plan   Alertness/Orientation: alert and fully oriented  Insight: fair, improved Judgment: fair, improved  Memory: intact  Executive Functions  Concentration: intact  Attention Span: Fair Recall: intact Fund of Knowledge: fair   Assets  Assets: Manufacturing systems engineer; Desire for Improvement; Financial Resources/Insurance; Housing; Physical Health; Resilience; Social Support     Physical Exam:  Physical Exam Vitals and nursing note reviewed.  Constitutional:      Appearance: Normal appearance.  HENT:     Head: Normocephalic and atraumatic.     Nose: Nose normal.  Eyes:     Pupils: Pupils are equal, round, and reactive to light.  Pulmonary:     Effort: Pulmonary effort is normal.  Musculoskeletal:        General: Normal range of motion.     Cervical back: Normal range of motion.  Neurological:     General: No focal deficit present.     Mental Status: He is alert and oriented to person, place, and time.  Psychiatric:        Mood and Affect: Mood normal.        Behavior: Behavior normal.        Thought Content: Thought content normal.        Judgment: Judgment normal.    Review of Systems  All other systems  reviewed and are negative.  Blood pressure 109/73, pulse 92, temperature 97.7 F (36.5 C), temperature source Oral, resp. rate 18, height 6' (1.829 m), weight 92.6  kg, SpO2 99 %. Body mass index is 27.69 kg/m.   Social History   Tobacco Use  Smoking Status Every Day   Packs/day: 0.25   Years: 15.00   Additional pack years: 0.00   Total pack years: 3.75   Types: Cigarettes  Smokeless Tobacco Former   Types: Snuff  Tobacco Comments   Pt states plans to stop drinking. Will deal with tobacco cessation later.   Tobacco Cessation:  A prescription for an FDA-approved tobacco cessation medication provided at discharge   Blood Alcohol level:  Lab Results  Component Value Date   ETH <10 03/25/2023   ETH 294 (H) 01/20/2023    Metabolic Disorder Labs:  Lab Results  Component Value Date   HGBA1C 5.6 03/28/2023   MPG 114 03/28/2023   MPG 108.28 05/14/2022   No results found for: "PROLACTIN" Lab Results  Component Value Date   CHOL 199 03/28/2023   TRIG 157 (H) 03/28/2023   HDL 64 03/28/2023   CHOLHDL 3.1 03/28/2023   VLDL 31 03/28/2023   LDLCALC 104 (H) 03/28/2023   LDLCALC 120 (H) 05/14/2022    See Psychiatric Specialty Exam and Suicide Risk Assessment completed by Attending Physician prior to discharge.  Discharge destination:  Home  Is patient on multiple antipsychotic therapies at discharge:  No   Has Patient had three or more failed trials of antipsychotic monotherapy by history:  No  Recommended Plan for Multiple Antipsychotic Therapies: NA  Discharge Instructions     Diet - low sodium heart healthy   Complete by: As directed    Increase activity slowly   Complete by: As directed       Allergies as of 03/31/2023   No Known Allergies      Medication List     STOP taking these medications    ARIPiprazole 5 MG tablet Commonly known as: ABILIFY   cyanocobalamin 100 MCG tablet   DULoxetine 60 MG capsule Commonly known as: CYMBALTA   Quintabs  Tabs       TAKE these medications      Indication  busPIRone 15 MG tablet Commonly known as: BUSPAR Take 1 tablet (15 mg total) by mouth 3 (three) times daily. What changed:  medication strength how much to take when to take this additional instructions  Indication: Anxiety Disorder   FLUoxetine 40 MG capsule Commonly known as: PROZAC Take 1 capsule (40 mg total) by mouth daily. Start taking on: April 01, 2023  Indication: Major Depressive Disorder   gabapentin 300 MG capsule Commonly known as: NEURONTIN Take 2 capsules (600 mg total) by mouth 3 (three) times daily. What changed:  medication strength how much to take additional instructions  Indication: Abuse or Misuse of Alcohol, Agitation   haloperidol 2 MG tablet Commonly known as: HALDOL Take 1 tablet (2 mg total) by mouth daily. Start taking on: April 01, 2023  Indication: Psychosis   hydrOXYzine 25 MG tablet Commonly known as: ATARAX Take 1 tablet (25 mg total) by mouth 3 (three) times daily as needed for anxiety (Sleep).  Indication: Feeling Anxious, Insomnia   levETIRAcetam 500 MG tablet Commonly known as: KEPPRA Take 1 tablet (500 mg total) by mouth 2 (two) times daily. What changed: additional instructions  Indication: Seizure   nicotine 21 mg/24hr patch Commonly known as: NICODERM CQ - dosed in mg/24 hours Place 1 patch (21 mg total) onto the skin daily. Start taking on: April 01, 2023 What changed: additional instructions  Indication: Nicotine Addiction   pantoprazole  40 MG tablet Commonly known as: PROTONIX Take 1 tablet (40 mg total) by mouth daily. Start taking on: April 01, 2023 What changed: additional instructions  Indication: Gastroesophageal Reflux Disease   traZODone 150 MG tablet Commonly known as: DESYREL Take 1 tablet (150 mg total) by mouth at bedtime. What changed: additional instructions  Indication: Trouble Sleeping        Follow-up Information     [facility] Follow up on 04/08/2023.   Specialty: Behavioral Health Why: You have an appointment for medication management services on 04/08/23 at 2:00 pm (this appointment will be  Virtual).   You also have an appointment for therapy services on  05/01/23 at 1:00 pm  (this appointment will be held In Person). Contact information: [phone] [place] [address] [phone]                Discharge recommendations:    Activity: as tolerated  Diet: heart healthy  # It is recommended to the patient to continue psychiatric medications as prescribed, after discharge from the hospital.     # It is recommended to the patient to follow up with your outpatient psychiatric provider and PCP.   # It was discussed with the patient, the impact of alcohol, drugs, tobacco have been there overall psychiatric and medical wellbeing, and total abstinence from substance use was recommended the patient.ed.   # Prescriptions provided or sent directly to preferred pharmacy at discharge. Patient agreeable to plan. Given opportunity to ask questions. Appears to feel comfortable with discharge.    # In the event of worsening symptoms, the patient is instructed to call the crisis hotline, 911 and or go to the nearest ED for appropriate evaluation and treatment of symptoms. To follow-up with primary care provider for other medical issues, concerns and or health care needs   # Patient was discharged home with a plan to follow up as noted above.  -Follow-up with outpatient primary care doctor and other specialists -for management of chronic medical disease, including: patieny agrees to see PCP for mgt of seizure disorder, he was given resources for PCP in the area.    Patient agrees with D/C instructions and plan.   The patient received suicide prevention pamphlet:  Yes Belongings returned:  Clothing and Valuables  Total Time Spent in Direct Patient Care:  I personally spent 45  minutes on the unit in direct patient care. The direct patient care time included face-to-face time with the patient, reviewing the patient's chart, communicating with other professionals, and coordinating care. Greater than 50% of this time was spent in counseling or coordinating care with the patient regarding goals of hospitalization, psycho-education, and discharge planning needs.    SignedSarita Bottom, MD 03/31/2023, 9:57 AM

## 2023-03-31 NOTE — Progress Notes (Signed)
   03/31/23 0559  15 Minute Checks  Location Bedroom  Visual Appearance Calm  Behavior Composed  Sleep (Behavioral Health Patients Only)  Calculate sleep? (Click Yes once per 24 hr at 0600 safety check) Yes  Documented sleep last 24 hours 10.5

## 2023-04-08 ENCOUNTER — Encounter (HOSPITAL_COMMUNITY): Payer: Self-pay

## 2023-04-08 ENCOUNTER — Other Ambulatory Visit (HOSPITAL_COMMUNITY): Payer: Self-pay | Admitting: Psychiatry

## 2023-04-08 ENCOUNTER — Ambulatory Visit (HOSPITAL_COMMUNITY): Payer: Medicaid Other | Admitting: Psychiatry

## 2023-04-08 ENCOUNTER — Emergency Department (HOSPITAL_COMMUNITY)
Admission: EM | Admit: 2023-04-08 | Discharge: 2023-04-09 | Disposition: A | Discharge status: Home / Self Care | Payer: Medicaid Other | Attending: Emergency Medicine | Admitting: Emergency Medicine

## 2023-04-08 DIAGNOSIS — R112 Nausea with vomiting, unspecified: Secondary | ICD-10-CM | POA: Insufficient documentation

## 2023-04-08 LAB — COMPREHENSIVE METABOLIC PANEL
ALT: 44 U/L (ref 0–44)
AST: 57 U/L — ABNORMAL HIGH (ref 15–41)
Albumin: 4.5 g/dL (ref 3.5–5.0)
Alkaline Phosphatase: 58 U/L (ref 38–126)
Anion gap: 15 (ref 5–15)
BUN: 9 mg/dL (ref 6–20)
CO2: 20 mmol/L — ABNORMAL LOW (ref 22–32)
Calcium: 9.6 mg/dL (ref 8.9–10.3)
Chloride: 101 mmol/L (ref 98–111)
Creatinine, Ser: 1.13 mg/dL (ref 0.61–1.24)
GFR, Estimated: >60 mL/min (ref >60)
Glucose, Bld: 103 mg/dL — ABNORMAL HIGH (ref 70–99)
Potassium: 3.7 mmol/L (ref 3.5–5.1)
Sodium: 136 mmol/L (ref 135–145)
Total Bilirubin: 1.1 mg/dL (ref 0.3–1.2)
Total Protein: 8.1 g/dL (ref 6.5–8.1)

## 2023-04-08 LAB — CBC
HCT: 45.6 % (ref 39.0–52.0)
Hemoglobin: 16.5 g/dL (ref 13.0–17.0)
MCH: 32.7 pg (ref 26.0–34.0)
MCHC: 36.2 g/dL — ABNORMAL HIGH (ref 30.0–36.0)
MCV: 90.3 fL (ref 80.0–100.0)
Platelets: 367 10*3/uL (ref 150–400)
RBC: 5.05 MIL/uL (ref 4.22–5.81)
RDW: 12.2 % (ref 11.5–15.5)
WBC: 5.6 10*3/uL (ref 4.0–10.5)
nRBC: 0 % (ref 0.0–0.2)

## 2023-04-08 LAB — LIPASE, BLOOD: Lipase: 32 U/L (ref 11–51)

## 2023-04-08 LAB — ETHANOL: Alcohol, Ethyl (B): <10 mg/dL (ref <10)

## 2023-04-08 MED ORDER — BUSPIRONE HCL 15 MG PO TABS: 15.0000 mg | ORAL_TABLET | Freq: Three times a day (TID) | ORAL | 0 refills | Status: DC

## 2023-04-08 MED ORDER — TRAZODONE HCL 150 MG PO TABS: 150.0000 mg | ORAL_TABLET | Freq: Every day | ORAL | 0 refills | Status: DC

## 2023-04-08 MED ORDER — HYDROXYZINE HCL 25 MG PO TABS: 25.0000 mg | ORAL_TABLET | Freq: Three times a day (TID) | ORAL | 0 refills | Status: DC | PRN

## 2023-04-08 MED ORDER — FLUOXETINE HCL 40 MG PO CAPS: 40.0000 mg | ORAL_CAPSULE | Freq: Every day | ORAL | 0 refills | Status: DC

## 2023-04-08 MED ORDER — HALOPERIDOL 2 MG PO TABS: 2.0000 mg | ORAL_TABLET | Freq: Every day | ORAL | 0 refills | Status: DC

## 2023-04-08 MED ORDER — NICOTINE 21 MG/24HR TD PT24: 21.0000 mg | MEDICATED_PATCH | Freq: Every day | TRANSDERMAL | 0 refills | Status: DC

## 2023-04-08 MED ORDER — GABAPENTIN 300 MG PO CAPS: 600.0000 mg | ORAL_CAPSULE | Freq: Three times a day (TID) | ORAL | 0 refills | Status: DC

## 2023-04-08 NOTE — Telephone Encounter (Signed)
Provider called patient's phone without patient's response.  Provider also sent virtual link without patient's response.  Provider called patient's mother which was an alternative number on his file and was informed that currently the patient is sick and vomiting.  She notes that she is concerned as he cannot keep anything down.  Provider encouraged patient's mother to take him to the ED or an urgent care.  She endorsed understanding and notes that she would if it becomes worse.  Patient given a 7-day supply of medication and his appointment was rescheduled for 04/14/2023 at 11:00.  No other concerns noted at this time.

## 2023-04-08 NOTE — ED Provider Triage Note (Signed)
Emergency Medicine Provider Triage Evaluation Note  John Vaughan , a 32 y.o. male  was evaluated in triage.  Pt complains of frequent vomiting for the last 5 days.  He states that he is having up to 10 episodes of vomiting per day.  For the last 3 days he has been unable to keep down his Keppra for his diagnosis of epilepsy.  States that he has had issues with vomiting in the past he believes to be related to alcohol.  Last alcohol consumption was yesterday.  Patient reports this is minimal.  Reports that at times he does drink daily but tries not to.  He does also have a history of withdrawal seizures but this has been a long time ago.  He has associated bilateral lower abdominal pain but no diarrhea, fever, chills, chest pain, or shortness of breath.  Review of Systems  Positive: See HPI Negative: See HPI  Physical Exam  BP (!) 140/99 (BP Location: Right Arm)   Pulse 80   Temp 98.5 F (36.9 C) (Oral)   Resp 16   SpO2 98%  Gen:   Awake, no distress   Resp:  Normal effort  MSK:   Moves extremities without difficulty  Other:  Alert and oriented, neurologically intact, mild generalized abdominal tenderness but no rebound, guarding, or peritoneal signs  Medical Decision Making  Medically screening exam initiated at 6:32 PM.  Appropriate orders placed.  [name] was informed that the remainder of the evaluation will be completed by another provider, this initial triage assessment does not replace that evaluation, and the importance of remaining in the ED until their evaluation is complete.     Tonette Lederer, PA-C 04/08/23 1834

## 2023-04-08 NOTE — ED Triage Notes (Signed)
Patient here for evaluation of abdominal pain, vomiting ,and diarrhea since Wednesday. Patient came in for evaluation due to being unable to swallow his keppra without vomiting since Friday.

## 2023-04-09 ENCOUNTER — Encounter (HOSPITAL_COMMUNITY): Payer: Self-pay

## 2023-04-09 MED ORDER — ONDANSETRON 4 MG PO TBDP: 4.0000 mg | ORAL_TABLET | Freq: Three times a day (TID) | ORAL | 0 refills | Status: DC | PRN

## 2023-04-09 MED ORDER — THIAMINE HCL 100 MG/ML IJ SOLN: 100.0000 mg | Freq: Every day | INTRAMUSCULAR | Status: DC

## 2023-04-09 MED ORDER — FAMOTIDINE IN NACL 20-0.9 MG/50ML-% IV SOLN
20.0000 mg | Freq: Once | INTRAVENOUS | Status: AC
  Administered 2023-04-09: 20 mg via INTRAVENOUS
  Filled 2023-04-09: qty 50

## 2023-04-09 MED ORDER — THIAMINE MONONITRATE 100 MG PO TABS: 100.0000 mg | ORAL_TABLET | Freq: Every day | ORAL | Status: DC

## 2023-04-09 MED ORDER — LORAZEPAM 2 MG/ML IJ SOLN
0.0000 mg | Freq: Four times a day (QID) | INTRAMUSCULAR | Status: DC
  Administered 2023-04-09: 1 mg via INTRAVENOUS
  Filled 2023-04-09: qty 1

## 2023-04-09 MED ORDER — SODIUM CHLORIDE 0.9 % IV BOLUS
1000.0000 mL | Freq: Once | INTRAVENOUS | Status: AC
  Administered 2023-04-09: 1000 mL via INTRAVENOUS

## 2023-04-09 MED ORDER — LORAZEPAM 1 MG PO TABS: 0.0000 mg | ORAL_TABLET | Freq: Four times a day (QID) | ORAL | Status: DC

## 2023-04-09 MED ORDER — ONDANSETRON HCL 4 MG/2ML IJ SOLN
4.0000 mg | Freq: Once | INTRAMUSCULAR | Status: AC
  Administered 2023-04-09: 4 mg via INTRAVENOUS
  Filled 2023-04-09: qty 2

## 2023-04-09 MED ORDER — THIAMINE HCL 100 MG/ML IJ SOLN
100.0000 mg | Freq: Every day | INTRAMUSCULAR | Status: DC
  Administered 2023-04-09: 100 mg via INTRAVENOUS
  Filled 2023-04-09: qty 2

## 2023-04-09 MED ORDER — LORAZEPAM 2 MG/ML IJ SOLN: 1.0000 mg | Freq: Once | INTRAMUSCULAR | Status: DC

## 2023-04-09 MED ORDER — LORAZEPAM 1 MG PO TABS: 0.0000 mg | ORAL_TABLET | Freq: Two times a day (BID) | ORAL | Status: DC

## 2023-04-09 MED ORDER — LEVETIRACETAM IN NACL 1000 MG/100ML IV SOLN
1000.0000 mg | Freq: Once | INTRAVENOUS | Status: AC
  Administered 2023-04-09: 1000 mg via INTRAVENOUS
  Filled 2023-04-09: qty 100

## 2023-04-09 MED ORDER — LORAZEPAM 2 MG/ML IJ SOLN: 0.0000 mg | Freq: Two times a day (BID) | INTRAMUSCULAR | Status: DC

## 2023-04-09 MED ORDER — FAMOTIDINE 20 MG PO TABS: 20.0000 mg | ORAL_TABLET | Freq: Two times a day (BID) | ORAL | 0 refills | Status: DC

## 2023-04-09 NOTE — ED Provider Notes (Signed)
[facility]PT [facility] Emergency Department Provider Note MRN:  [id number]  Arrival date & time: 04/09/23     Chief Complaint   Abdominal Pain   History of Present Illness   John Vaughan is a 32 y.o. year-old male presents to the ED with chief complaint of nausea and vomiting.  He states that he has not been able to keep down his Keppra.  He also drinks regularly and has not been able to keep down any alcohol.  He does have history of alcohol withdrawal seizures.  He states that he feels a bit tremulous.  Denies fevers or chills.  Denies any known sick contacts.  History provided by patient.   Review of Systems  Pertinent positive and negative review of systems noted in HPI.    Physical Exam   Vitals:   04/09/23 0300 04/09/23 0330  BP: 127/87 120/79  Pulse: 83 71  Resp:  18  Temp:  98 F (36.7 C)  SpO2:  97%    CONSTITUTIONAL:  well-appearing, NAD NEURO:  Alert and oriented x 3, CN 3-12 grossly intact EYES:  eyes equal and reactive ENT/NECK:  Supple, no stridor  CARDIO:  normal rate, regular rhythm, appears well-perfused  PULM:  No respiratory distress,  GI/GU:  non-distended,  MSK/SPINE:  No gross deformities, no edema, moves all extremities  SKIN:  no rash, atraumatic   *Additional and/or pertinent findings included in MDM below  Diagnostic and Interventional Summary    EKG Interpretation  Date/Time:    Ventricular Rate:    PR Interval:    QRS Duration:   QT Interval:    QTC Calculation:   R Axis:     Text Interpretation:         Labs Reviewed  COMPREHENSIVE METABOLIC PANEL - Abnormal; Notable for the following components:      Result Value   CO2 20 (*)    Glucose, Bld 103 (*)    AST 57 (*)    All other components within normal limits  CBC - Abnormal; Notable for the following components:   MCHC 36.2 (*)    All other components within normal limits  LIPASE, BLOOD  ETHANOL  URINALYSIS, ROUTINE W REFLEX MICROSCOPIC  RAPID  URINE DRUG SCREEN, HOSP PERFORMED  LEVETIRACETAM LEVEL    No orders to display    Medications  LORazepam (ATIVAN) injection 0-4 mg (1 mg Intravenous Given 04/09/23 0219)    Or  LORazepam (ATIVAN) tablet 0-4 mg ( Oral See Alternative 04/09/23 0219)  LORazepam (ATIVAN) injection 0-4 mg (has no administration in time range)    Or  LORazepam (ATIVAN) tablet 0-4 mg (has no administration in time range)  LORazepam (ATIVAN) injection 1 mg (1 mg Intravenous Not Given 04/09/23 0210)  thiamine (VITAMIN B1) tablet 100 mg ( Oral See Alternative 04/09/23 0220)    Or  thiamine (VITAMIN B1) injection 100 mg (100 mg Intravenous Given 04/09/23 0220)  sodium chloride 0.9 % bolus 1,000 mL (0 mLs Intravenous Stopped 04/09/23 0313)  ondansetron (ZOFRAN) injection 4 mg (4 mg Intravenous Given 04/09/23 0211)  levETIRAcetam (KEPPRA) IVPB 1000 mg/100 mL premix (0 mg Intravenous Stopped 04/09/23 0400)  famotidine (PEPCID) IVPB 20 mg premix (0 mg Intravenous Stopped 04/09/23 0434)     Procedures  /  Critical Care Procedures  ED Course and Medical Decision Making  I have reviewed the triage vital signs, the nursing notes, and pertinent available records from the EMR.  Social Determinants Affecting Complexity of Care: Patient has  no clinically significant social determinants affecting this chief complaint..   ED Course:    Medical Decision Making Patient here with nausea and vomiting.  He reports about 10 episodes of vomiting yesterday.  States has been unable to keep down his Keppra.  I have ordered a dose of IV Keppra, Zofran, Pepcid.  Will also give a dose of Ativan since he is a bit tremulous.  Vitals are stable.  Patient states that he is feeling improved after treatment.  He is tolerating oral intake.  Will send Zofran to pharmacy.  Return precautions discussed.  Amount and/or Complexity of Data Reviewed Labs: ordered.    Details: No significant electrolyte derangement, normal lipase, doubt  pancreatitis No leukocytosis  Risk OTC drugs. Prescription drug management.     Consultants: No consultations were needed in caring for this patient.   Treatment and Plan: Emergency department workup does not suggest an emergent condition requiring admission or immediate intervention beyond  what has been performed at this time. The patient is safe for discharge and has  been instructed to return immediately for worsening symptoms, change in  symptoms or any other concerns    Final Clinical Impressions(s) / ED Diagnoses     ICD-10-CM   1. Nausea and vomiting, unspecified vomiting type  R11.2       ED Discharge Orders          Ordered    ondansetron (ZOFRAN-ODT) 4 MG disintegrating tablet  Every 8 hours PRN        04/09/23 0511    famotidine (PEPCID) 20 MG tablet  2 times daily        04/09/23 1610              Discharge Instructions Discussed with and Provided to Patient:   Discharge Instructions   None      Roxy Horseman, PA-C 04/09/23 0523    Gilda Crease, MD 04/09/23 [phone]

## 2023-04-10 LAB — LEVETIRACETAM LEVEL: Levetiracetam Lvl: <2 ug/mL — ABNORMAL LOW (ref 10.0–40.0)

## 2023-04-15 ENCOUNTER — Encounter (HOSPITAL_COMMUNITY): Payer: Self-pay | Admitting: Psychiatry

## 2023-04-15 ENCOUNTER — Ambulatory Visit (INDEPENDENT_AMBULATORY_CARE_PROVIDER_SITE_OTHER): Payer: No Payment, Other | Admitting: Psychiatry

## 2023-04-15 DIAGNOSIS — F411 Generalized anxiety disorder: Secondary | ICD-10-CM | POA: Diagnosis not present

## 2023-04-15 DIAGNOSIS — F32A Depression, unspecified: Secondary | ICD-10-CM | POA: Diagnosis not present

## 2023-04-15 DIAGNOSIS — F1011 Alcohol abuse, in remission: Secondary | ICD-10-CM | POA: Diagnosis not present

## 2023-04-15 MED ORDER — FLUOXETINE HCL 40 MG PO CAPS
40.0000 mg | ORAL_CAPSULE | Freq: Every day | ORAL | 3 refills | Status: DC
Start: 2023-04-15 — End: 2023-06-11

## 2023-04-15 MED ORDER — HYDROXYZINE HCL 25 MG PO TABS
25.0000 mg | ORAL_TABLET | Freq: Three times a day (TID) | ORAL | 3 refills | Status: DC | PRN
Start: 2023-04-15 — End: 2023-06-12

## 2023-04-15 MED ORDER — BUSPIRONE HCL 15 MG PO TABS
15.0000 mg | ORAL_TABLET | Freq: Three times a day (TID) | ORAL | 3 refills | Status: DC
Start: 2023-04-15 — End: 2023-06-11

## 2023-04-15 MED ORDER — GABAPENTIN 300 MG PO CAPS
600.0000 mg | ORAL_CAPSULE | Freq: Three times a day (TID) | ORAL | 3 refills | Status: DC
Start: 2023-04-15 — End: 2023-06-11

## 2023-04-15 MED ORDER — TRAZODONE HCL 150 MG PO TABS
150.0000 mg | ORAL_TABLET | Freq: Every day | ORAL | 3 refills | Status: DC
Start: 2023-04-15 — End: 2023-06-12

## 2023-04-15 MED ORDER — HALOPERIDOL 2 MG PO TABS
2.0000 mg | ORAL_TABLET | Freq: Every day | ORAL | 3 refills | Status: DC
Start: 2023-04-15 — End: 2023-06-12
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
## 2023-04-18 ENCOUNTER — Emergency Department (HOSPITAL_BASED_OUTPATIENT_CLINIC_OR_DEPARTMENT_OTHER)
Admission: EM | Admit: 2023-04-18 | Discharge: 2023-04-18 | Disposition: A | Discharge status: Home / Self Care | Payer: Medicaid Other | Attending: Emergency Medicine | Admitting: Emergency Medicine

## 2023-04-18 ENCOUNTER — Other Ambulatory Visit: Payer: Self-pay

## 2023-04-18 ENCOUNTER — Ambulatory Visit
Admission: EM | Admit: 2023-04-18 | Discharge: 2023-04-18 | Disposition: A | Discharge status: Home / Self Care | Payer: Medicaid Other

## 2023-04-18 DIAGNOSIS — R569 Unspecified convulsions: Secondary | ICD-10-CM | POA: Insufficient documentation

## 2023-04-18 LAB — COMPREHENSIVE METABOLIC PANEL
ALT: 19 U/L (ref 0–44)
AST: 21 U/L (ref 15–41)
Albumin: 4.5 g/dL (ref 3.5–5.0)
Alkaline Phosphatase: 52 U/L (ref 38–126)
Anion gap: 11 (ref 5–15)
BUN: 10 mg/dL (ref 6–20)
CO2: 24 mmol/L (ref 22–32)
Calcium: 9.1 mg/dL (ref 8.9–10.3)
Chloride: 101 mmol/L (ref 98–111)
Creatinine, Ser: 0.98 mg/dL (ref 0.61–1.24)
GFR, Estimated: >60 mL/min (ref >60)
Glucose, Bld: 82 mg/dL (ref 70–99)
Potassium: 3.7 mmol/L (ref 3.5–5.1)
Sodium: 136 mmol/L (ref 135–145)
Total Bilirubin: 1.1 mg/dL (ref 0.3–1.2)
Total Protein: 7.2 g/dL (ref 6.5–8.1)

## 2023-04-18 LAB — CBG MONITORING, ED: Glucose-Capillary: 87 mg/dL (ref 70–99)

## 2023-04-18 LAB — CBC WITH DIFFERENTIAL/PLATELET
Abs Immature Granulocytes: 0.01 10*3/uL (ref 0.00–0.07)
Basophils Absolute: 0 10*3/uL (ref 0.0–0.1)
Basophils Relative: 0 %
Eosinophils Absolute: 0.1 10*3/uL (ref 0.0–0.5)
Eosinophils Relative: 1 %
HCT: 40.3 % (ref 39.0–52.0)
Hemoglobin: 14.1 g/dL (ref 13.0–17.0)
Immature Granulocytes: 0 %
Lymphocytes Relative: 22 %
Lymphs Abs: 1.2 10*3/uL (ref 0.7–4.0)
MCH: 32.9 pg (ref 26.0–34.0)
MCHC: 35 g/dL (ref 30.0–36.0)
MCV: 94.2 fL (ref 80.0–100.0)
Monocytes Absolute: 1.1 10*3/uL — ABNORMAL HIGH (ref 0.1–1.0)
Monocytes Relative: 20 %
Neutro Abs: 3.3 10*3/uL (ref 1.7–7.7)
Neutrophils Relative %: 57 %
Platelets: 270 10*3/uL (ref 150–400)
RBC: 4.28 MIL/uL (ref 4.22–5.81)
RDW: 12.8 % (ref 11.5–15.5)
WBC: 5.7 10*3/uL (ref 4.0–10.5)
nRBC: 0 % (ref 0.0–0.2)

## 2023-04-18 LAB — ETHANOL: Alcohol, Ethyl (B): <10 mg/dL (ref <10)

## 2023-04-18 MED ORDER — LEVETIRACETAM 500 MG PO TABS: 500.0000 mg | ORAL_TABLET | Freq: Two times a day (BID) | ORAL | 0 refills | Status: DC

## 2023-04-18 MED ORDER — SODIUM CHLORIDE 0.9 % IV BOLUS
1000.0000 mL | Freq: Once | INTRAVENOUS | Status: AC
  Administered 2023-04-18: 1000 mL via INTRAVENOUS

## 2023-04-18 MED ORDER — LEVETIRACETAM IN NACL 1000 MG/100ML IV SOLN
1000.0000 mg | Freq: Once | INTRAVENOUS | Status: AC
  Administered 2023-04-18: 1000 mg via INTRAVENOUS

## 2023-04-18 MED ORDER — ONDANSETRON HCL 4 MG/2ML IJ SOLN
4.0000 mg | Freq: Once | INTRAMUSCULAR | Status: AC
  Administered 2023-04-18: 4 mg via INTRAVENOUS
  Filled 2023-04-18: qty 2

## 2023-04-18 NOTE — ED Notes (Signed)
ED Provider at bedside. 

## 2023-04-18 NOTE — ED Provider Notes (Signed)
[facility]    CSN: [id number] Arrival date & time: 04/18/23  1853      History   Chief Complaint Chief Complaint  Patient presents with   Medication Refill    HPI John Vaughan is a 32 y.o. male presents for refill of his Keppra.  Patient has a very complex medical history including seizures, major depressive disorder with psychosis, cocaine and alcohol abuse, benzodiazepine dependence.  He recently was seen in behavioral health and had all of his medications refilled with the exception of Keppra.  It appears he was referred to neurology for any refills but he has not yet seen them.  He states he has been out of his Keppra for 5 days.  He reports this morning he thinks he had a seizure.  He was alone and this was unwitnessed.  He is not sure how long he was out for but he does endorse loss of bladder function.  He also endorses some tremors since it occurred.  He currently denies any headache, nausea/vomiting.  He is here with his girlfriend.  No other concerns at this time.   Medication Refill   Past Medical History:  Diagnosis Date   Alcohol abuse    Anxiety    Bipolar 2 disorder (HCC)    Depression    Seizures (HCC)     Patient Active Problem List   Diagnosis Date Noted   Cocaine use 08/26/2022   History of seizures 08/26/2022   MDD (major depressive disorder), recurrent, severe, with psychosis (HCC) 06/16/2022   Compression fracture of T7 vertebra 03/05/2021   COVID-19 virus infection 03/05/2021   Chronic low back pain 07/14/2020   Personal history of scoliosis 07/14/2020   Cannabis use disorder, severe, dependence (HCC) 06/06/2020   Homicidal ideation    Thrombocytopenia (HCC) 04/09/2020   Benzodiazepine dependence (HCC) 11/17/2019   Drug withdrawal seizure without complication (HCC) 11/17/2019   Elevated liver enzymes 11/17/2019   Tobacco abuse 11/17/2019   GAD (generalized anxiety disorder) 03/09/2019   Alcohol use disorder, severe,  dependence (HCC) 03/09/2019   Reduced libido 04/17/2016    Past Surgical History:  Procedure Laterality Date   HAND SURGERY Right        Home Medications    Prior to Admission medications   Medication Sig Start Date End Date Taking? Authorizing Provider  busPIRone (BUSPAR) 15 MG tablet Take 1 tablet (15 mg total) by mouth 3 (three) times daily. 04/15/23   Shanna Cisco, NP  famotidine (PEPCID) 20 MG tablet Take 1 tablet (20 mg total) by mouth 2 (two) times daily. 04/09/23   Roxy Horseman, PA-C  FLUoxetine (PROZAC) 40 MG capsule Take 1 capsule (40 mg total) by mouth daily. 04/15/23   Shanna Cisco, NP  gabapentin (NEURONTIN) 300 MG capsule Take 2 capsules (600 mg total) by mouth 3 (three) times daily. 04/15/23   Shanna Cisco, NP  haloperidol (HALDOL) 2 MG tablet Take 1 tablet (2 mg total) by mouth daily. 04/15/23   Shanna Cisco, NP  hydrOXYzine (ATARAX) 25 MG tablet Take 1 tablet (25 mg total) by mouth 3 (three) times daily as needed for anxiety (Sleep). 04/15/23   Shanna Cisco, NP  levETIRAcetam (KEPPRA) 500 MG tablet Take 1 tablet (500 mg total) by mouth 2 (two) times daily. 03/31/23   Sarita Bottom, MD  ondansetron (ZOFRAN-ODT) 4 MG disintegrating tablet Take 1 tablet (4 mg total) by mouth every 8 (eight) hours as needed for nausea or vomiting. 04/09/23  Roxy Horseman, PA-C  pantoprazole (PROTONIX) 40 MG tablet Take 1 tablet (40 mg total) by mouth daily. 04/01/23   Sarita Bottom, MD  traZODone (DESYREL) 150 MG tablet Take 1 tablet (150 mg total) by mouth at bedtime. 04/15/23   Shanna Cisco, NP    Family History Family History  Problem Relation Age of Onset   Anxiety disorder Mother    Alcohol abuse Father    Anxiety disorder Maternal Aunt    Anxiety disorder Maternal Grandmother    Alcohol abuse Paternal Grandfather     Social History Social History   Tobacco Use   Smoking status: Every Day    Packs/day: 0.25    Years: 15.00     Additional pack years: 0.00    Total pack years: 3.75    Types: Cigarettes   Smokeless tobacco: Former    Types: Snuff   Tobacco comments:    Pt states plans to stop drinking. Will deal with tobacco cessation later.  Vaping Use   Vaping Use: Never used  Substance Use Topics   Alcohol use: Yes    Alcohol/week: 2.0 standard drinks of alcohol    Types: 2 Cans of beer per week    Comment: 48 beers a week   Drug use: Yes    Types: Cocaine     Allergies   Patient has no known allergies.   Review of Systems Review of Systems  Neurological:  Positive for tremors and seizures.     Physical Exam Triage Vital Signs ED Triage Vitals  Enc Vitals Group     BP 04/18/23 1912 (!) 149/100     Pulse Rate 04/18/23 1912 93     Resp 04/18/23 1912 18     Temp 04/18/23 1912 98.7 F (37.1 C)     Temp Source 04/18/23 1912 Oral     SpO2 04/18/23 1912 95 %     Weight --      Height --      Head Circumference --      Peak Flow --      Pain Score 04/18/23 1917 0     Pain Loc --      Pain Edu? --      Excl. in GC? --    No data found.  Updated Vital Signs BP (!) 149/100 (BP Location: Right Arm)   Pulse 93   Temp 98.7 F (37.1 C) (Oral)   Resp 18   SpO2 95%   Visual Acuity Right Eye Distance:   Left Eye Distance:   Bilateral Distance:    Right Eye Near:   Left Eye Near:    Bilateral Near:     Physical Exam Vitals and nursing note reviewed.  Constitutional:      General: He is not in acute distress.    Appearance: Normal appearance. He is not ill-appearing.  HENT:     Head: Normocephalic and atraumatic.  Eyes:     Pupils: Pupils are equal, round, and reactive to light.  Cardiovascular:     Rate and Rhythm: Normal rate.  Pulmonary:     Effort: Pulmonary effort is normal.  Skin:    General: Skin is warm and dry.  Neurological:     General: No focal deficit present.     Mental Status: He is alert and oriented to person, place, and time.     GCS: GCS eye subscore is  4. GCS verbal subscore is 5. GCS motor subscore is 6.  Motor: No seizure activity.  Psychiatric:        Mood and Affect: Mood normal.        Behavior: Behavior normal.      UC Treatments / Results  Labs (all labs ordered are listed, but only abnormal results are displayed) Labs Reviewed - No data to display  EKG   Radiology No results found.  Procedures Procedures (including critical care time)  Medications Ordered in UC Medications - No data to display  Initial Impression / Assessment and Plan / UC Course  I have reviewed the triage vital signs and the nursing notes.  Pertinent labs & imaging results that were available during my care of the patient were reviewed by me and considered in my medical decision making (see chart for details).     I reviewed patient recent progress notes from behavioral health as well as ER visits.  I also reviewed recent labs.  Given patient's been off his Keppra abruptly for 5 days and reports a seizure this morning advised to go to the ER for further evaluation and treatment.  He is in agreement with plan and will go POV with his girlfriend driving as he declines EMS.  They were instructed to pull over and call 911 for any symptoms that occur in transit and they both verbalized understanding  Final diagnoses:  Seizures [facility])     Discharge Instructions      Please go to the emergency room for further evaluation and treatment    ED Prescriptions   None    PDMP not reviewed this encounter.   Radford Pax, NP 04/18/23 [phone]

## 2023-04-18 NOTE — ED Notes (Signed)
RN reviewed discharge instructions with pt. Pt verbalized understanding and had no further questions. VSS upon discharge.  

## 2023-04-18 NOTE — ED Notes (Signed)
Patient is being discharged from the Urgent Care and sent to the Emergency Department via POV . Per Lennox Laity, NP, patient is in need of higher level of care due to pt said he had a seizure today. Patient is aware and verbalizes understanding of plan of care.  Vitals:   04/18/23 1912  BP: (!) 149/100  Pulse: 93  Resp: 18  Temp: 98.7 F (37.1 C)  SpO2: 95%

## 2023-04-18 NOTE — Discharge Instructions (Addendum)
Please go to the emergency room for further evaluation and treatment.

## 2023-04-18 NOTE — ED Triage Notes (Signed)
Pt states he has been out of his Keppra for 4 days. States his psychologist refilled all meds but this one. States woke up today feeling anxious and tremors. States his stomach is in knots d/t stress and was vomiting. States keeping fluids. Denies having seizure activities.

## 2023-04-18 NOTE — ED Notes (Signed)
Seizure pads applied to bed rails

## 2023-04-18 NOTE — ED Provider Notes (Signed)
EMERGENCY DEPARTMENT AT [facility] Provider Note   CSN: [id number] Arrival date & time: 04/18/23  1945     History  Chief Complaint  Patient presents with   Seizures    John Vaughan is a 32 y.o. male.  Patient is a 32 year old male patient who has a history of seizures and had a seizure today.  He said he has been out of his Keppra for the last 5 days.  His psychiatrist refilled his other medications but was told that he needed to see a neurologist about refill on the Keppra.  He has been on Keppra for many years but has not recently seen a neurologist.  He has had some episodes of nausea and vomiting intermittently over the last week but no other illnesses.  No abdominal pain.  No fevers.  He has had some diarrhea associated with this.  No recent head trauma.  He was laying on the couch during the seizure and did not have any injuries.  He did not bite his tongue.  He says he used to be an alcoholic but does not drink much currently.  He had 3 drinks on Monday but has not had any since that time.  That was 3 days ago.  No drug use.       Home Medications Prior to Admission medications   Medication Sig Start Date End Date Taking? Authorizing Provider  busPIRone (BUSPAR) 15 MG tablet Take 1 tablet (15 mg total) by mouth 3 (three) times daily. 04/15/23   Shanna Cisco, NP  famotidine (PEPCID) 20 MG tablet Take 1 tablet (20 mg total) by mouth 2 (two) times daily. 04/09/23   Roxy Horseman, PA-C  FLUoxetine (PROZAC) 40 MG capsule Take 1 capsule (40 mg total) by mouth daily. 04/15/23   Shanna Cisco, NP  gabapentin (NEURONTIN) 300 MG capsule Take 2 capsules (600 mg total) by mouth 3 (three) times daily. 04/15/23   Shanna Cisco, NP  haloperidol (HALDOL) 2 MG tablet Take 1 tablet (2 mg total) by mouth daily. 04/15/23   Shanna Cisco, NP  hydrOXYzine (ATARAX) 25 MG tablet Take 1 tablet (25 mg total) by mouth 3 (three) times daily as needed  for anxiety (Sleep). 04/15/23   Shanna Cisco, NP  levETIRAcetam (KEPPRA) 500 MG tablet Take 1 tablet (500 mg total) by mouth 2 (two) times daily. 04/18/23   Rolan Bucco, MD  ondansetron (ZOFRAN-ODT) 4 MG disintegrating tablet Take 1 tablet (4 mg total) by mouth every 8 (eight) hours as needed for nausea or vomiting. 04/09/23   Roxy Horseman, PA-C  pantoprazole (PROTONIX) 40 MG tablet Take 1 tablet (40 mg total) by mouth daily. 04/01/23   Sarita Bottom, MD  traZODone (DESYREL) 150 MG tablet Take 1 tablet (150 mg total) by mouth at bedtime. 04/15/23   Shanna Cisco, NP      Allergies    Patient has no known allergies.    Review of Systems   Review of Systems  Constitutional:  Negative for chills, diaphoresis, fatigue and fever.  HENT:  Negative for congestion, rhinorrhea and sneezing.   Eyes: Negative.   Respiratory:  Negative for cough, chest tightness and shortness of breath.   Cardiovascular:  Negative for chest pain and leg swelling.  Gastrointestinal:  Positive for diarrhea, nausea and vomiting. Negative for abdominal pain and blood in stool.  Genitourinary:  Negative for difficulty urinating, flank pain, frequency and hematuria.  Musculoskeletal:  Negative for arthralgias and back  pain.  Skin:  Negative for rash.  Neurological:  Positive for seizures. Negative for dizziness, speech difficulty, weakness, numbness and headaches.    Physical Exam Updated Vital Signs BP 120/81   Pulse 81   Temp 98 F (36.7 C) (Oral)   Resp 18   SpO2 98%  Physical Exam Constitutional:      Appearance: He is well-developed.  HENT:     Head: Normocephalic and atraumatic.  Eyes:     Pupils: Pupils are equal, round, and reactive to light.  Cardiovascular:     Rate and Rhythm: Normal rate and regular rhythm.     Heart sounds: Normal heart sounds.  Pulmonary:     Effort: Pulmonary effort is normal. No respiratory distress.     Breath sounds: Normal breath sounds. No wheezing or  rales.  Chest:     Chest wall: No tenderness.  Abdominal:     General: Bowel sounds are normal.     Palpations: Abdomen is soft.     Tenderness: There is no abdominal tenderness. There is no guarding or rebound.  Musculoskeletal:        General: Normal range of motion.     Cervical back: Normal range of motion and neck supple.  Lymphadenopathy:     Cervical: No cervical adenopathy.  Skin:    General: Skin is warm and dry.     Findings: No rash.  Neurological:     General: No focal deficit present.     Mental Status: He is alert and oriented to person, place, and time.     ED Results / Procedures / Treatments   Labs (all labs ordered are listed, but only abnormal results are displayed) Labs Reviewed  CBC WITH DIFFERENTIAL/PLATELET - Abnormal; Notable for the following components:      Result Value   Monocytes Absolute 1.1 (*)    All other components within normal limits  COMPREHENSIVE METABOLIC PANEL  ETHANOL  CBG MONITORING, ED    EKG None  Radiology No results found.  Procedures Procedures    Medications Ordered in ED Medications  levETIRAcetam (KEPPRA) IVPB 1000 mg/100 mL premix (0 mg Intravenous Stopped 04/18/23 2150)  sodium chloride 0.9 % bolus 1,000 mL (0 mLs Intravenous Stopped 04/18/23 2258)  ondansetron (ZOFRAN) injection 4 mg (4 mg Intravenous Given 04/18/23 2157)    ED Course/ Medical Decision Making/ A&P                             Medical Decision Making Amount and/or Complexity of Data Reviewed Labs: ordered.  Risk Prescription drug management.   Patient is a 32 year old who has a history of seizures and had a seizure today.  He is alert and oriented.  No neurologic deficits.  No injuries from the seizure.  He ran out of his Keppra a few days ago.  He has had some nausea and vomiting recently.  He was given some IV fluids.  His labs were checked and are nonconcerning.  He has not had any further seizure activity.  He was loaded with a dose of  IV Keppra.  He was discharged home in good condition.  He was given a prescription for Keppra and given a referral to neurology.  Return precautions were given.  Final Clinical Impression(s) / ED Diagnoses Final diagnoses:  Seizure (HCC)    Rx / DC Orders ED Discharge Orders          Ordered  levETIRAcetam (KEPPRA) 500 MG tablet  2 times daily        04/18/23 2231    Ambulatory referral to Neurology       Comments: An appointment is requested in approximately: 2 weeks   04/18/23 2231              Rolan Bucco, MD 04/18/23 2305

## 2023-04-18 NOTE — ED Triage Notes (Signed)
Out of keppra for 4 days. Psychologist refilled all meds, but not keppra. N/V today, seizure reported this AM.

## 2023-04-24 ENCOUNTER — Emergency Department (HOSPITAL_COMMUNITY)
Admission: EM | Admit: 2023-04-24 | Discharge: 2023-04-25 | Disposition: A | Discharge status: Home / Self Care | Payer: Medicaid Other | Attending: Emergency Medicine | Admitting: Emergency Medicine

## 2023-04-24 ENCOUNTER — Encounter (HOSPITAL_COMMUNITY): Payer: Self-pay | Admitting: *Deleted

## 2023-04-24 ENCOUNTER — Other Ambulatory Visit: Payer: Self-pay

## 2023-04-24 DIAGNOSIS — R45851 Suicidal ideations: Secondary | ICD-10-CM | POA: Insufficient documentation

## 2023-04-24 DIAGNOSIS — F17210 Nicotine dependence, cigarettes, uncomplicated: Secondary | ICD-10-CM | POA: Insufficient documentation

## 2023-04-24 DIAGNOSIS — F1020 Alcohol dependence, uncomplicated: Secondary | ICD-10-CM | POA: Insufficient documentation

## 2023-04-24 DIAGNOSIS — F1994 Other psychoactive substance use, unspecified with psychoactive substance-induced mood disorder: Secondary | ICD-10-CM | POA: Diagnosis not present

## 2023-04-24 DIAGNOSIS — F1914 Other psychoactive substance abuse with psychoactive substance-induced mood disorder: Secondary | ICD-10-CM | POA: Diagnosis present

## 2023-04-24 LAB — COMPREHENSIVE METABOLIC PANEL
ALT: 21 U/L (ref 0–44)
AST: 24 U/L (ref 15–41)
Albumin: 4.5 g/dL (ref 3.5–5.0)
Alkaline Phosphatase: 56 U/L (ref 38–126)
Anion gap: 12 (ref 5–15)
BUN: 6 mg/dL (ref 6–20)
CO2: 20 mmol/L — ABNORMAL LOW (ref 22–32)
Calcium: 9.6 mg/dL (ref 8.9–10.3)
Chloride: 107 mmol/L (ref 98–111)
Creatinine, Ser: 0.94 mg/dL (ref 0.61–1.24)
GFR, Estimated: >60 mL/min (ref >60)
Glucose, Bld: 95 mg/dL (ref 70–99)
Potassium: 4.1 mmol/L (ref 3.5–5.1)
Sodium: 139 mmol/L (ref 135–145)
Total Bilirubin: 0.6 mg/dL (ref 0.3–1.2)
Total Protein: 8 g/dL (ref 6.5–8.1)

## 2023-04-24 LAB — RAPID URINE DRUG SCREEN, HOSP PERFORMED
Amphetamines: NOT DETECTED
Barbiturates: NOT DETECTED
Benzodiazepines: NOT DETECTED
Cocaine: NOT DETECTED
Opiates: NOT DETECTED
Tetrahydrocannabinol: NOT DETECTED

## 2023-04-24 LAB — SALICYLATE LEVEL: Salicylate Lvl: <7 mg/dL — ABNORMAL LOW (ref 7.0–30.0)

## 2023-04-24 LAB — ETHANOL: Alcohol, Ethyl (B): 265 mg/dL — ABNORMAL HIGH (ref <10)

## 2023-04-24 LAB — CBC
HCT: 44.4 % (ref 39.0–52.0)
Hemoglobin: 15.8 g/dL (ref 13.0–17.0)
MCH: 33.3 pg (ref 26.0–34.0)
MCHC: 35.6 g/dL (ref 30.0–36.0)
MCV: 93.5 fL (ref 80.0–100.0)
Platelets: 284 10*3/uL (ref 150–400)
RBC: 4.75 MIL/uL (ref 4.22–5.81)
RDW: 13.1 % (ref 11.5–15.5)
WBC: 5.1 10*3/uL (ref 4.0–10.5)
nRBC: 0 % (ref 0.0–0.2)

## 2023-04-24 LAB — ACETAMINOPHEN LEVEL: Acetaminophen (Tylenol), Serum: <10 ug/mL — ABNORMAL LOW (ref 10–30)

## 2023-04-24 MED ORDER — CHLORDIAZEPOXIDE HCL 25 MG PO CAPS
25.0000 mg | ORAL_CAPSULE | Freq: Once | ORAL | Status: AC
  Administered 2023-04-24: 25 mg via ORAL
  Filled 2023-04-24: qty 1

## 2023-04-24 NOTE — ED Provider Notes (Signed)
[place] EMERGENCY DEPARTMENT AT [facility] Provider Note   CSN: [id number] Arrival date & time: 04/24/23  2002     History  Chief Complaint  Patient presents with   Psychiatric Evaluation    John Vaughan is a 32 y.o. male.  32 yo M with a cc of suicidal ideation.  Patient states that he plans on driving his car discussed the can and trying to run into something.  He tells me he is try to commit suicide previously 1 by cutting and 1 by shooting himself.  He takes medications for his mental health but stopped because it was causing issues with his sexual performance.  He feels like things otherwise been going well in his life.  Denies any recent stressors.  Denies any medical problems.  He has a history of seizures and takes Keppra.  He takes it most the time regularly in the morning but sometimes forgets at night.  He does drink frequently but has never had any issues with withdrawal.        Home Medications Prior to Admission medications   Medication Sig Start Date End Date Taking? Authorizing Provider  busPIRone (BUSPAR) 15 MG tablet Take 1 tablet (15 mg total) by mouth 3 (three) times daily. 04/15/23   Shanna Cisco, NP  famotidine (PEPCID) 20 MG tablet Take 1 tablet (20 mg total) by mouth 2 (two) times daily. 04/09/23   Roxy Horseman, PA-C  FLUoxetine (PROZAC) 40 MG capsule Take 1 capsule (40 mg total) by mouth daily. 04/15/23   Shanna Cisco, NP  gabapentin (NEURONTIN) 300 MG capsule Take 2 capsules (600 mg total) by mouth 3 (three) times daily. 04/15/23   Shanna Cisco, NP  haloperidol (HALDOL) 2 MG tablet Take 1 tablet (2 mg total) by mouth daily. 04/15/23   Shanna Cisco, NP  hydrOXYzine (ATARAX) 25 MG tablet Take 1 tablet (25 mg total) by mouth 3 (three) times daily as needed for anxiety (Sleep). 04/15/23   Shanna Cisco, NP  levETIRAcetam (KEPPRA) 500 MG tablet Take 1 tablet (500 mg total) by mouth 2 (two) times daily. 04/18/23    Rolan Bucco, MD  ondansetron (ZOFRAN-ODT) 4 MG disintegrating tablet Take 1 tablet (4 mg total) by mouth every 8 (eight) hours as needed for nausea or vomiting. 04/09/23   Roxy Horseman, PA-C  pantoprazole (PROTONIX) 40 MG tablet Take 1 tablet (40 mg total) by mouth daily. 04/01/23   Sarita Bottom, MD  traZODone (DESYREL) 150 MG tablet Take 1 tablet (150 mg total) by mouth at bedtime. 04/15/23   Shanna Cisco, NP      Allergies    Patient has no known allergies.    Review of Systems   Review of Systems  Physical Exam Updated Vital Signs BP 109/86   Pulse 88   Temp 97.9 F (36.6 C)   Resp 18   Ht 6' (1.829 m)   Wt 92.6 kg   SpO2 96%   BMI 27.69 kg/m  Physical Exam Vitals and nursing note reviewed.  Constitutional:      Appearance: He is well-developed.  HENT:     Head: Normocephalic and atraumatic.  Eyes:     Pupils: Pupils are equal, round, and reactive to light.  Neck:     Vascular: No JVD.  Cardiovascular:     Rate and Rhythm: Normal rate and regular rhythm.     Heart sounds: No murmur heard.    No friction rub. No gallop.  Pulmonary:     Effort: No respiratory distress.     Breath sounds: No wheezing.  Abdominal:     General: There is no distension.     Tenderness: There is no abdominal tenderness. There is no guarding or rebound.  Musculoskeletal:        General: Normal range of motion.     Cervical back: Normal range of motion and neck supple.  Skin:    Coloration: Skin is not pale.     Findings: No rash.  Neurological:     Mental Status: He is alert and oriented to person, place, and time.  Psychiatric:        Behavior: Behavior normal.     ED Results / Procedures / Treatments   Labs (all labs ordered are listed, but only abnormal results are displayed) Labs Reviewed  COMPREHENSIVE METABOLIC PANEL - Abnormal; Notable for the following components:      Result Value   CO2 20 (*)    All other components within normal limits  ETHANOL -  Abnormal; Notable for the following components:   Alcohol, Ethyl (B) 265 (*)    All other components within normal limits  SALICYLATE LEVEL - Abnormal; Notable for the following components:   Salicylate Lvl <7.0 (*)    All other components within normal limits  ACETAMINOPHEN LEVEL - Abnormal; Notable for the following components:   Acetaminophen (Tylenol), Serum <10 (*)    All other components within normal limits  CBC  RAPID URINE DRUG SCREEN, HOSP PERFORMED  LEVETIRACETAM LEVEL    EKG EKG Interpretation  Date/Time:  Wednesday Apr 24 2023 21:08:43 EDT Ventricular Rate:  93 PR Interval:  156 QRS Duration: 106 QT Interval:  354 QTC Calculation: 440 R Axis:   12 Text Interpretation: Normal sinus rhythm Incomplete right bundle branch block Borderline ECG No significant change since last tracing Confirmed by Melene Plan [phone]) on 04/24/2023 9:16:43 PM  Radiology No results found.  Procedures Procedures    Medications Ordered in ED Medications  chlordiazePOXIDE (LIBRIUM) capsule 25 mg (25 mg Oral Given 04/24/23 2155)    ED Course/ Medical Decision Making/ A&P                             Medical Decision Making Amount and/or Complexity of Data Reviewed Labs: ordered.  Risk Prescription drug management.   64 yoM with a chief complaint of suicidal ideation.  Patient plans to drive his car into something.  His lab work is resulted without significant electrolyte abnormalities.  He is intoxicated.  He is able to communicate and I think he would be reasonable to have a discussion with TTS at this time.  I feel he is medically clear.  The patients results and plan were reviewed and discussed.   Any x-rays performed were independently reviewed by myself.   Differential diagnosis were considered with the presenting HPI.  Medications  chlordiazePOXIDE (LIBRIUM) capsule 25 mg (25 mg Oral Given 04/24/23 2155)    Vitals:   04/24/23 2030 04/24/23 2035  BP:  109/86  Pulse:  88   Resp:  18  Temp:  97.9 F (36.6 C)  SpO2:  96%  Weight: 92.6 kg   Height: 6' (1.829 m)     Final diagnoses:  Suicidal ideation           Final Clinical Impression(s) / ED Diagnoses Final diagnoses:  Suicidal ideation    Rx / DC Orders ED  Discharge Orders     None         Melene Plan, [place] 04/24/23 2205

## 2023-04-24 NOTE — ED Triage Notes (Signed)
The  pt reports that he is suicidall  he was just here on the 25th  no plan for killing himself   he also drinks alcohol  last drink was 6 hours ago

## 2023-04-24 NOTE — ED Notes (Addendum)
Pt noted to be in scrub top but jeans and boots. Pt willing to fully change into scrubs with visitor, [name], support at bedside. Pt tearful but understanding and cooperative. Pt drinks ~18 beers a day with last drink a few hours ago

## 2023-04-24 NOTE — ED Provider Triage Note (Signed)
Emergency Medicine Provider Triage Evaluation Note  John Vaughan , a 32 y.o. male  was evaluated in triage.  Pt complains of SI. Has hx of attempt. Was sober until 3 days ago when he began drinking again. Has plan but does not want to disclose. Denies HI. Reports auditory hallucinations last night.  Review of Systems  Positive: As above Negative: As above    Physical Exam  BP 109/86   Pulse 88   Temp 97.9 F (36.6 C)   Resp 18   Ht 6' (1.829 m)   Wt 92.6 kg   SpO2 96%   BMI 27.69 kg/m  Gen:   Awake, no distress   Resp:  Normal effort  MSK:   Moves extremities without difficulty  Other:    Medical Decision Making  Medically screening exam initiated at 8:48 PM.  Appropriate orders placed.  [name] was informed that the remainder of the evaluation will be completed by another provider, this initial triage assessment does not replace that evaluation, and the importance of remaining in the ED until their evaluation is complete.  Workup initiated   Mora Bellman 04/24/23 2049

## 2023-04-25 DIAGNOSIS — F1994 Other psychoactive substance use, unspecified with psychoactive substance-induced mood disorder: Secondary | ICD-10-CM | POA: Diagnosis not present

## 2023-04-25 MED ORDER — LORAZEPAM 1 MG PO TABS
1.0000 mg | ORAL_TABLET | ORAL | Status: DC | PRN
  Administered 2023-04-25: 1 mg via ORAL
  Filled 2023-04-25: qty 1

## 2023-04-25 MED ORDER — ADULT MULTIVITAMIN W/MINERALS CH
1.0000 | ORAL_TABLET | Freq: Every day | ORAL | Status: DC
  Administered 2023-04-25: 1 via ORAL
  Filled 2023-04-25: qty 1

## 2023-04-25 MED ORDER — THIAMINE HCL 100 MG/ML IJ SOLN: 100.0000 mg | Freq: Every day | INTRAMUSCULAR | Status: DC

## 2023-04-25 MED ORDER — FOLIC ACID 1 MG PO TABS
1.0000 mg | ORAL_TABLET | Freq: Every day | ORAL | Status: DC
  Administered 2023-04-25: 1 mg via ORAL
  Filled 2023-04-25: qty 1

## 2023-04-25 MED ORDER — THIAMINE MONONITRATE 100 MG PO TABS
100.0000 mg | ORAL_TABLET | Freq: Every day | ORAL | Status: DC
  Administered 2023-04-25: 100 mg via ORAL
  Filled 2023-04-25: qty 1

## 2023-04-25 MED ORDER — LEVETIRACETAM 500 MG PO TABS
500.0000 mg | ORAL_TABLET | Freq: Two times a day (BID) | ORAL | Status: DC
  Administered 2023-04-25: 500 mg via ORAL
  Filled 2023-04-25: qty 1

## 2023-04-25 NOTE — ED Provider Notes (Signed)
Emergency Medicine Observation Re-evaluation Note  Euclide Granito is a 32 y.o. male, seen on rounds today.  Pt initially presented to the ED for complaints of Psychiatric Evaluation Currently, the patient is resting quietly.  Physical Exam  BP 108/77   Pulse 66   Temp 97.9 F (36.6 C) (Oral)   Resp 17   Ht 6' (1.829 m)   Wt 92.6 kg   SpO2 98%   BMI 27.69 kg/m  Physical Exam General: No acute distress Cardiac: Well-perfused Lungs: Nonlabored Psych: Cooperative  ED Course / MDM  EKG:EKG Interpretation  Date/Time:  Wednesday Apr 24 2023 21:08:43 EDT Ventricular Rate:  93 PR Interval:  156 QRS Duration: 106 QT Interval:  354 QTC Calculation: 440 R Axis:   12 Text Interpretation: Normal sinus rhythm Incomplete right bundle branch block Borderline ECG No significant change since last tracing Confirmed by Melene Plan [phone]) on 04/24/2023 9:16:43 PM  I have reviewed the labs performed to date as well as medications administered while in observation.  Recent changes in the last 24 hours include awaiting TTS evaluation.  Plan  Current plan is for TTS evaluation.  10:15 AM.  Informed by psychiatry that the patient has been psychiatrically cleared for discharge.  Girlfriend is coming to pick him up.    Terrilee Files, MD 04/25/23 [phone]

## 2023-04-25 NOTE — Consult Note (Signed)
[facility] Face-to-Face Psychiatry Consult   Reason for Consult:  Psych consult  Referring Physician:  Melene Plan, DO  Patient Identification: John Vaughan MRN:  [id number] Principal Diagnosis: Substance induced mood disorder (HCC) Diagnosis:  Principal Problem:   Substance induced mood disorder (HCC) Active Problems:   Alcohol dependence (HCC)   Total Time spent with patient: 30 minutes  Subjective:   John Vaughan is a 32 y.o. male patient admitted to the [facility] ED with complaints of suicidal ideations.   HPI:  John Vaughan is a 32 year old male patient with a past psychiatric history significant for MDD, GAD, benzodiazepine dependence, cocaine use disorder, and self-injurious behaviors by cutting. Patient seen and evaluated face-to-face by this provider, chart reviewed and case discussed with Dr. Lucianne Muss. On evaluation, patient is lying down in bed awake in no acute distress. He is alert and oriented x 4. His thought process is linear and speech is clear and coherent at a moderate tone. His mood is dysphoric and affect is congruent. He has fair eye contact. He is calm and cooperative. Patient states that for the past 2 days he has been feeling suicidal, depressed and relapsed on alcohol. He is unable to identify current stressors or triggers attributing to his symptoms. He reports suicidal ideations with no plan or intent. He describes his current depressive symptoms as feeling "pain in me that won't go away," hopelessness, crying spells, and poor sleep. He reports a fair appetite and states that sometimes it varies. He denies HI/AVH. No psychosis observed on exam,. He reports drinking heavily for the past 2 days and reports drinking 4 (40 ounce) beers per day x 2 days. He reports being sober x one week prior to relapsing on alcohol. Patient's BAL on arrival was 265. He reports withdrawal symptoms of feeling shaky. Patient does not appear to be in significant withdrawal on  exam. Vital signs are stable. He denies recent drugs use. He reports being off of drugs for the past two months. UDS on arrival was negative.  I discussed with the patient treatment at the [facility] behavioral health facility based crisis unit for mood stabilization and substance use. Patient declined treatment. He states that he is only willing to go to [facility] behavioral health hospital. I discussed with the patient that I have no control over placement. Patient states that he would rather go home. He gives permission for this provider to safety plan with his girlfriend John Vaughan [phone]. He identifies protective factors as : Sense of responsibility to family, 50-year-old son, and relationship with his girlfriend. He identities his girlfriend as his support person. He contracts for safety to return home today. He is established with outpatient services at the [facility] behavioral health clinic for medication management. Next appointment for medication management is scheduled for 06/13/23 at 10:30 am. He states that he is prescribed Prozac, BuSpar, gabapentin, and Haldol. He states that he does not take the trazodone. He states that he takes his morning medications but forgets to take his nighttime medications. I discussed with the patient at length the importance of staying compliant with taking his psychotropic medications for mood stabilization. Per EDP note, on arrival, the patient reported that he stopped taking his medication due to issues with sexual performance. However, on my exam patient denies medication side effects. Patient denies outpatient therapy. I discussed with the patient getting established with outpatient therapy at the [facility] behavioral health outpatient clinic to address depressive symptoms and substance use.  I contacted the [facility] behavioral health outpatient clinic to establish the patient a new patient appointment for therapy. I was informed by the  receptionist that the patient is scheduled for a new patient therapy appointment on May 01, 2023 at 1 PM. Patient resides with his mother and girlfriend. Patient denies access to firearms. 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   I spoke to the patient's girl friend John Vaughan [phone] via telephone. She denies any safety concerns with the patient returning home. She confirms that there are no firearms in the home. She was advised that the patient is recommended to follow-up with outpatient psychiatry for medication management and therapy at the [facility] behavioral health outpatient clinic. She was advised that if the patient's symptoms worsen or if he expresses active suicidal ideations to take the patient to the [facility] behavioral health urgent care, nearest emergency department, or call 911 for evaluation. She verbalizes understanding.   Past Psychiatric History: History of MDD, GAD, benzodiazepine dependence, cocaine use disorder, self-injurious behaviors by cutting, and several inpatient psychiatric hospitalizations. Patient last hospitalized at [facility] from 03/26/2023 to 03/31/2023.  Risk to Self:  Low risk, contracts for safety.  Risk to Others:  No Prior Inpatient Therapy:  Yes  Prior Outpatient Therapy:  Yes   Past Medical History:  Past Medical History:  Diagnosis Date   Alcohol abuse    Anxiety    Bipolar 2 disorder (HCC)    Depression    Seizures (HCC)     Past Surgical History:  Procedure Laterality Date   HAND SURGERY Right    Family History:  Family History  Problem Relation Age of Onset   Anxiety disorder Mother    Alcohol abuse Father    Anxiety disorder Maternal Aunt    Anxiety disorder Maternal Grandmother    Alcohol abuse Paternal Grandfather    Social History: Lives with mother and girlfriend. Patient denies recent drugs use. Patient reports drinking alcohol heavily for the past two days.  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   Social History   Substance and Sexual Activity  Alcohol Use Yes   Alcohol/week: 2.0 standard drinks of alcohol   Types: 2 Cans of beer per week   Comment: 48 beers a week     Social History   Substance and Sexual Activity  Drug Use Yes   Types: Cocaine    Social History   Socioeconomic History   Marital status: Single    Spouse name: Not on file   Number of children: Not on file   Years of education: Not on file   Highest education level: Not on file  Occupational History   Not on file  Tobacco Use   Smoking status: Every Day    Packs/day: 0.25    Years: 15.00    Additional pack years: 0.00    Total pack years: 3.75    Types: Cigarettes   Smokeless tobacco: Former    Types: Snuff   Tobacco comments:    Pt states plans to stop drinking. Will deal with tobacco cessation later.  Vaping Use   Vaping Use: Never used  Substance and Sexual Activity   Alcohol use: Yes    Alcohol/week: 2.0 standard drinks of alcohol    Types: 2 Cans of beer per week    Comment: 48 beers a week   Drug use: Yes    Types: Cocaine   Sexual  activity: Not Currently  Other Topics Concern   Not on file  Social History Narrative   Not on file   Social Determinants of Health   Financial Resource Strain: Not on file  Food Insecurity: Food Insecurity Present (03/26/2023)   Hunger Vital Sign    Worried About Running Out of Food in the Last Year: Often true    Ran Out of Food in the Last Year: Often true  Transportation Needs: Unmet Transportation Needs (03/31/2023)   PRAPARE - Administrator, Civil Service (Medical): Yes    Lack of Transportation (Non-Medical): Yes  Physical Activity: Not on file  Stress: Not on file  Social Connections: Not on file   Additional Social History:    Allergies:  No Known Allergies  Labs:  Results for orders placed or performed during the hospital encounter of 04/24/23 (from  the past 48 hour(s))  Comprehensive metabolic panel     Status: Abnormal   Collection Time: 04/24/23  8:44 PM  Result Value Ref Range   Sodium 139 135 - 145 mmol/L   Potassium 4.1 3.5 - 5.1 mmol/L   Chloride 107 98 - 111 mmol/L   CO2 20 (L) 22 - 32 mmol/L   Glucose, Bld 95 70 - 99 mg/dL    Comment: Glucose reference range applies only to samples taken after fasting for at least 8 hours.   BUN 6 6 - 20 mg/dL   Creatinine, Ser 0.98 0.61 - 1.24 mg/dL   Calcium 9.6 8.9 - 11.9 mg/dL   Total Protein 8.0 6.5 - 8.1 g/dL   Albumin 4.5 3.5 - 5.0 g/dL   AST 24 15 - 41 U/L   ALT 21 0 - 44 U/L   Alkaline Phosphatase 56 38 - 126 U/L   Total Bilirubin 0.6 0.3 - 1.2 mg/dL   GFR, Estimated >14 >78 mL/min    Comment: (NOTE) Calculated using the CKD-EPI Creatinine Equation (2021)    Anion gap 12 5 - 15    Comment: Performed at [facility], [address], [place], [place] [address]  Ethanol     Status: Abnormal   Collection Time: 04/24/23  8:44 PM  Result Value Ref Range   Alcohol, Ethyl (B) 265 (H) <10 mg/dL    Comment: (NOTE) Lowest detectable limit for serum alcohol is 10 mg/dL.  For medical purposes only. Performed at [facility], [address], [place], [place] [address]   Salicylate level     Status: Abnormal   Collection Time: 04/24/23  8:44 PM  Result Value Ref Range   Salicylate Lvl <7.0 (L) 7.0 - 30.0 mg/dL    Comment: Performed at [facility], [address] [place], [place] [address]  Acetaminophen level     Status: Abnormal   Collection Time: 04/24/23  8:44 PM  Result Value Ref Range   Acetaminophen (Tylenol), Serum <10 (L) 10 - 30 ug/mL    Comment: (NOTE) Therapeutic concentrations vary significantly. A range of 10-30 ug/mL  may be an effective concentration for many patients. However, some  are best treated at concentrations outside of this range. Acetaminophen concentrations >150 ug/mL at 4 hours after ingestion  and >50 ug/mL at 12 hours after  ingestion are often associated with  toxic reactions.  Performed at [facility], [address] [place], [place] [address]   cbc     Status: None   Collection Time: 04/24/23  8:44 PM  Result Value Ref  Range   WBC 5.1 4.0 - 10.5 K/uL   RBC 4.75 4.22 - 5.81 MIL/uL   Hemoglobin 15.8 13.0 - 17.0 g/dL   HCT 16.1 09.6 - 04.5 %   MCV 93.5 80.0 - 100.0 fL   MCH 33.3 26.0 - 34.0 pg   MCHC 35.6 30.0 - 36.0 g/dL   RDW 40.9 81.1 - 91.4 %   Platelets 284 150 - 400 K/uL   nRBC 0.0 0.0 - 0.2 %    Comment: Performed at [facility], [address] [place], [place] [address]  Rapid urine drug screen (hospital performed)     Status: None   Collection Time: 04/24/23  8:46 PM  Result Value Ref Range   Opiates NONE DETECTED NONE DETECTED   Cocaine NONE DETECTED NONE DETECTED   Benzodiazepines NONE DETECTED NONE DETECTED   Amphetamines NONE DETECTED NONE DETECTED   Tetrahydrocannabinol NONE DETECTED NONE DETECTED   Barbiturates NONE DETECTED NONE DETECTED    Comment: (NOTE) DRUG SCREEN FOR MEDICAL PURPOSES ONLY.  IF CONFIRMATION IS NEEDED FOR ANY PURPOSE, NOTIFY LAB WITHIN 5 DAYS.  LOWEST DETECTABLE LIMITS FOR URINE DRUG SCREEN Drug Class                     Cutoff (ng/mL) Amphetamine and metabolites    1000 Barbiturate and metabolites    200 Benzodiazepine                 200 Opiates and metabolites        300 Cocaine and metabolites        300 THC                            50 Performed at [facility], [address], [place], [place] [address]     Current Facility-Administered Medications  Medication Dose Route Frequency Provider Last Rate Last Admin   folic acid (FOLVITE) tablet 1 mg  1 mg Oral Daily Terrilee Files, MD   1 mg at 04/25/23 0946   levETIRAcetam (KEPPRA) tablet 500 mg  500 mg Oral BID Terrilee Files, MD   500 mg at 04/25/23 0946   LORazepam (ATIVAN) tablet 1-4 mg  1-4 mg Oral Q1H PRN Terrilee Files, MD   1 mg at 04/25/23 8657    multivitamin with minerals tablet 1 tablet  1 tablet Oral Daily Terrilee Files, MD   1 tablet at 04/25/23 8469   thiamine (VITAMIN B1) tablet 100 mg  100 mg Oral Daily Terrilee Files, MD   100 mg at 04/25/23 6295   Or   thiamine (VITAMIN B1) injection 100 mg  100 mg Intravenous Daily Terrilee Files, MD       Current Outpatient Medications  Medication Sig Dispense Refill   busPIRone (BUSPAR) 15 MG tablet Take 1 tablet (15 mg total) by mouth 3 (three) times daily. 90 tablet 3   famotidine (PEPCID) 20 MG tablet Take 1 tablet (20 mg total) by mouth 2 (two) times daily. 30 tablet 0   FLUoxetine (PROZAC) 40 MG capsule Take 1 capsule (40 mg total) by mouth daily. 30 capsule 3   gabapentin (NEURONTIN) 300 MG capsule Take 2 capsules (600 mg total) by mouth 3 (three) times daily. 180 capsule 3   haloperidol (HALDOL) 2 MG tablet Take 1 tablet (2 mg total) by mouth daily. 30 tablet 3   hydrOXYzine (ATARAX) 25 MG tablet  Take 1 tablet (25 mg total) by mouth 3 (three) times daily as needed for anxiety (Sleep). 90 tablet 3   levETIRAcetam (KEPPRA) 500 MG tablet Take 1 tablet (500 mg total) by mouth 2 (two) times daily. 60 tablet 0   ondansetron (ZOFRAN-ODT) 4 MG disintegrating tablet Take 1 tablet (4 mg total) by mouth every 8 (eight) hours as needed for nausea or vomiting. 10 tablet 0   pantoprazole (PROTONIX) 40 MG tablet Take 1 tablet (40 mg total) by mouth daily. 30 tablet 0   traZODone (DESYREL) 150 MG tablet Take 1 tablet (150 mg total) by mouth at bedtime. 30 tablet 3    Musculoskeletal: Strength & Muscle Tone: within normal limits Gait & Station: normal Patient leans: N/A  Psychiatric Specialty Exam:  Presentation  General Appearance:  Appropriate for Environment  Eye Contact: Fair  Speech: Clear and Coherent  Speech Volume: Normal  Handedness: Right   Mood and Affect  Mood: Dysphoric  Affect: Congruent   Thought Process  Thought Processes: Coherent; Goal  Directed  Descriptions of Associations:Intact  Orientation:Full (Time, Place and Person)  Thought Content:Logical  History of Schizophrenia/Schizoaffective disorder:No  Duration of Psychotic Symptoms:N/A  Hallucinations:Hallucinations: None  Ideas of Reference:None  Suicidal Thoughts:Suicidal Thoughts: Yes, Passive SI Passive Intent and/or Plan: With Means to Carry Out; Without Intent; Without Plan  Homicidal Thoughts:Homicidal Thoughts: No   Sensorium  Memory: Immediate Fair; Recent Fair; Remote Fair  Judgment: Fair  Insight: Fair   Chartered certified accountant: Fair  Attention Span: Fair  Recall: [employer] of Knowledge: Fair  Language: Fair   Psychomotor Activity  Psychomotor Activity: Psychomotor Activity: Normal   Assets  Assets: Communication Skills; Desire for Improvement; Financial Resources/Insurance; Intimacy; Leisure Time; Social Support   Sleep  Sleep: Sleep: Fair   Physical Exam: Physical Exam HENT:     Head: Normocephalic.     Nose: Nose normal.  Cardiovascular:     Rate and Rhythm: Normal rate.  Pulmonary:     Effort: Pulmonary effort is normal.  Musculoskeletal:        General: Normal range of motion.     Cervical back: Normal range of motion.  Neurological:     Mental Status: He is alert and oriented to person, place, and time.    Review of Systems  Constitutional: Negative.   HENT: Negative.    Eyes: Negative.   Respiratory: Negative.    Cardiovascular: Negative.   Gastrointestinal: Negative.   Genitourinary: Negative.   Musculoskeletal: Negative.   Neurological: Negative.   Endo/Heme/Allergies: Negative.    Blood pressure 108/77, pulse 66, temperature 97.9 F (36.6 C), temperature source Oral, resp. rate 17, height 6' (1.829 m), weight 92.6 kg, SpO2 98 %. Body mass index is 27.69 kg/m.  Treatment Plan Summary:  Medications: Patient is to take medications as prescribed. NO MEDICATION CHANGES  WERE MADE DURING YOUR VISIT IN THE ED. The patient or patient's guardian is to contact a medical professional and/or outpatient provider to address any new side effects that develop. The patient or the patient's guardian should update outpatient providers of any new medications and/or medication changes.   Outpatient Follow up: Please follow up with the [facility] behavioral health outpatient clinic for medication management and therapy.  Your next appointments are scheduled for:   Therapy: May 01, 2023, at 1 PM with Richardson Dopp  Medication management: June 20, 24 at 10:30 AM with Sherril Cong for medication management  Therapy: We recommend that patient participate in  individual therapy to address mental health concerns.  Atypical antipsychotics: If you are prescribed an atypical antipsychotic, it is recommended that your height, weight, BMI, blood pressure, fasting lipid panel, and fasting blood sugar be monitored by your outpatient providers.  Safety:   The following safety precautions should be taken:   No sharp objects. This includes scissors, razors, scrapers, and putty knives.   Chemicals should be removed and locked up.   Medications should be removed and locked up.   Weapons should be removed and locked up. This includes firearms, knives and instruments that can be used to cause injury.   The patient should abstain from use of illicit substances/drugs and abuse of any medications.  If symptoms worsen or do not continue to improve or if the patient becomes actively suicidal or homicidal then it is recommended that the patient return to the closest hospital emergency department, the [facility], or call 911 for further evaluation and treatment. National Suicide Prevention Lifeline 1-800-SUICIDE or 423-438-6400.  About 988 988 offers 24/7 access to trained crisis counselors who can help people experiencing mental health-related distress.  People can call or text 988 or chat 988lifeline.org for themselves or if they are worried about a loved one who may need crisis support.   Disposition: No evidence of imminent risk to self or others at present.   Patient does not meet criteria for psychiatric inpatient admission. Supportive therapy provided about ongoing stressors. Discussed crisis plan, support from social network, calling 911, coming to the Emergency Department, and calling Suicide Hotline.   Although this patient presented with SI, he does not appear to be at imminent risk of dangerousness to self and dangerousness to others at this time. While future psychiatric events cannot be accurately predicted, the patient does not necessitate nor desire further acute inpatient psychiatric care at this time. This patient presents with risk factors that includes history of self injurious behaviors and past suicide attempts. These are mitigated by protective factors which include lack of active SI/HI, no history of violence, sense of responsibility to family and social supports, presence of an available support system, employment or functioning in a structured work/academic setting, enjoyment of leisure actvities, expresses purpose for living, and effective problem solving skills.  Waunetta Riggle L, NP 04/25/2023 10:03 AM

## 2023-04-25 NOTE — BH Assessment (Addendum)
Clinician reached out to RN Shellon via secure chat, to inquire on completing TTS assessment. Per RN, patient is unable to engage in an assessment at this time. RN stated she will notify RN when patient is awake and alert.   Manfred Arch, MSW, LCSW Triage Specialist

## 2023-04-25 NOTE — ED Notes (Addendum)
Per pt, information can be given to his girlfriend, [name] [phone]

## 2023-04-25 NOTE — ED Notes (Signed)
Reviewed discharge instructions with patient. Follow-up care, scheduled appointments, and medications reviewed. Patient verbalized understanding. Patient A&Ox4, VSS, and ambulatory with steady gait upon discharge.

## 2023-04-25 NOTE — ED Notes (Signed)
Significant other called requesting to speak to patient, crying and sobbing that she was turned away from visitation because visitation had closed until the next time of 12:30 to 1pm.  This author read the chart note after putting the caller on hold and came back on the line and discussed visiting hours and expected behavior in the controlled environment when visiting. Caller remained distraught, yelling, and was hard to understand. Caller asked that patient be told that she called and requests his call back. Discussed matter with RN.

## 2023-04-25 NOTE — Discharge Instructions (Addendum)
Discharge recommendations:   Medications: Patient is to take medications as prescribed. NO MEDICATION CHANGES WERE MADE DURING YOUR VISIT IN THE ED. The patient or patient's guardian is to contact a medical professional and/or outpatient provider to address any new side effects that develop. The patient or the patient's guardian should update outpatient providers of any new medications and/or medication changes.   Outpatient Follow up: Please follow up with the [facility] behavioral health outpatient clinic for medication management and therapy.  Your next appointments are scheduled for:   Therapy: May 01, 2023, at 1 PM with Richardson Dopp  Medication management: June 20, 24 at 10:30 AM with Sherril Cong for medication management  Therapy: We recommend that patient participate in individual therapy to address mental health concerns.  Atypical antipsychotics: If you are prescribed an atypical antipsychotic, it is recommended that your height, weight, BMI, blood pressure, fasting lipid panel, and fasting blood sugar be monitored by your outpatient providers.  Safety:   The following safety precautions should be taken:   No sharp objects. This includes scissors, razors, scrapers, and putty knives.   Chemicals should be removed and locked up.   Medications should be removed and locked up.   Weapons should be removed and locked up. This includes firearms, knives and instruments that can be used to cause injury.   The patient should abstain from use of illicit substances/drugs and abuse of any medications.  If symptoms worsen or do not continue to improve or if the patient becomes actively suicidal or homicidal then it is recommended that the patient return to the closest hospital emergency department, the [facility], or call 911 for further evaluation and treatment. National Suicide Prevention Lifeline 1-800-SUICIDE or 815-692-7335.  About 988 988  offers 24/7 access to trained crisis counselors who can help people experiencing mental health-related distress. People can call or text 988 or chat 988lifeline.org for themselves or if they are worried about a loved one who may need crisis support.

## 2023-04-25 NOTE — ED Notes (Signed)
Notified by security that there is a visitor wanting to come see the pt. This RN told security to let the visitor know that the visiting time is over at this time and they can come back at 1230. Lead security came back to [place] to ask about visitation because visitor was very adamant about seeing and talking to pt. Reiterated that visiting time is over at this time and gave security the family expectations form, that lists the visiting hours on it, to give to visitor.

## 2023-04-26 LAB — LEVETIRACETAM LEVEL: Levetiracetam Lvl: 2.6 ug/mL — ABNORMAL LOW (ref 10.0–40.0)

## 2023-05-01 ENCOUNTER — Ambulatory Visit (HOSPITAL_COMMUNITY): Payer: No Typology Code available for payment source | Admitting: Licensed Clinical Social Worker

## 2023-05-21 ENCOUNTER — Other Ambulatory Visit: Payer: Self-pay

## 2023-05-21 ENCOUNTER — Emergency Department (HOSPITAL_COMMUNITY)
Admission: EM | Admit: 2023-05-21 | Discharge: 2023-05-22 | Disposition: A | Discharge status: Home / Self Care | Payer: Medicaid Other | Attending: Emergency Medicine | Admitting: Emergency Medicine

## 2023-05-21 ENCOUNTER — Emergency Department (HOSPITAL_COMMUNITY): Payer: Medicaid Other

## 2023-05-21 DIAGNOSIS — R569 Unspecified convulsions: Secondary | ICD-10-CM

## 2023-05-21 DIAGNOSIS — R200 Anesthesia of skin: Secondary | ICD-10-CM | POA: Diagnosis not present

## 2023-05-21 DIAGNOSIS — R110 Nausea: Secondary | ICD-10-CM | POA: Diagnosis not present

## 2023-05-21 DIAGNOSIS — R1013 Epigastric pain: Secondary | ICD-10-CM | POA: Insufficient documentation

## 2023-05-21 DIAGNOSIS — R0789 Other chest pain: Secondary | ICD-10-CM

## 2023-05-21 DIAGNOSIS — R202 Paresthesia of skin: Secondary | ICD-10-CM | POA: Diagnosis not present

## 2023-05-21 DIAGNOSIS — R0602 Shortness of breath: Secondary | ICD-10-CM | POA: Insufficient documentation

## 2023-05-21 DIAGNOSIS — R6889 Other general symptoms and signs: Secondary | ICD-10-CM | POA: Diagnosis not present

## 2023-05-21 DIAGNOSIS — Z8616 Personal history of COVID-19: Secondary | ICD-10-CM | POA: Diagnosis not present

## 2023-05-21 LAB — TROPONIN I (HIGH SENSITIVITY): Troponin I (High Sensitivity): 2 ng/L (ref <18)

## 2023-05-21 LAB — CBC
HCT: 43.5 % (ref 39.0–52.0)
Hemoglobin: 15.4 g/dL (ref 13.0–17.0)
MCH: 32.4 pg (ref 26.0–34.0)
MCHC: 35.4 g/dL (ref 30.0–36.0)
MCV: 91.6 fL (ref 80.0–100.0)
Platelets: 349 10*3/uL (ref 150–400)
RBC: 4.75 MIL/uL (ref 4.22–5.81)
RDW: 12.1 % (ref 11.5–15.5)
WBC: 6.3 10*3/uL (ref 4.0–10.5)
nRBC: 0 % (ref 0.0–0.2)

## 2023-05-21 LAB — BASIC METABOLIC PANEL
Anion gap: 15 (ref 5–15)
BUN: 7 mg/dL (ref 6–20)
CO2: 20 mmol/L — ABNORMAL LOW (ref 22–32)
Calcium: 9.5 mg/dL (ref 8.9–10.3)
Chloride: 103 mmol/L (ref 98–111)
Creatinine, Ser: 0.93 mg/dL (ref 0.61–1.24)
GFR, Estimated: >60 mL/min (ref >60)
Glucose, Bld: 84 mg/dL (ref 70–99)
Potassium: 3.6 mmol/L (ref 3.5–5.1)
Sodium: 138 mmol/L (ref 135–145)

## 2023-05-21 NOTE — ED Triage Notes (Signed)
Patient reports intermittent central chest pain this morning with SOB and occasional dry cough , pain radiating to left face with numbness . No emesis or diaphoresis .

## 2023-05-22 LAB — HEPATIC FUNCTION PANEL
ALT: 24 U/L (ref 0–44)
AST: 29 U/L (ref 15–41)
Albumin: 4.3 g/dL (ref 3.5–5.0)
Alkaline Phosphatase: 52 U/L (ref 38–126)
Bilirubin, Direct: <0.1 mg/dL (ref 0.0–0.2)
Total Bilirubin: 0.6 mg/dL (ref 0.3–1.2)
Total Protein: 7.5 g/dL (ref 6.5–8.1)

## 2023-05-22 LAB — TROPONIN I (HIGH SENSITIVITY): Troponin I (High Sensitivity): 2 ng/L (ref <18)

## 2023-05-22 LAB — LIPASE, BLOOD: Lipase: 42 U/L (ref 11–51)

## 2023-05-22 MED ORDER — ONDANSETRON HCL 4 MG/2ML IJ SOLN
4.0000 mg | Freq: Once | INTRAMUSCULAR | Status: AC
  Administered 2023-05-22: 4 mg via INTRAVENOUS
  Filled 2023-05-22: qty 2

## 2023-05-22 MED ORDER — LIDOCAINE VISCOUS HCL 2 % MT SOLN
15.0000 mL | Freq: Once | OROMUCOSAL | Status: AC
  Administered 2023-05-22: 15 mL via ORAL
  Filled 2023-05-22: qty 15

## 2023-05-22 MED ORDER — ALUM & MAG HYDROXIDE-SIMETH 200-200-20 MG/5ML PO SUSP
30.0000 mL | Freq: Once | ORAL | Status: AC
  Administered 2023-05-22: 30 mL via ORAL
  Filled 2023-05-22: qty 30

## 2023-05-22 MED ORDER — SODIUM CHLORIDE 0.9 % IV BOLUS
1000.0000 mL | Freq: Once | INTRAVENOUS | Status: AC
  Administered 2023-05-22: 1000 mL via INTRAVENOUS

## 2023-05-22 MED ORDER — LEVETIRACETAM IN NACL 1000 MG/100ML IV SOLN
1000.0000 mg | Freq: Once | INTRAVENOUS | Status: AC
  Administered 2023-05-22: 1000 mg via INTRAVENOUS
  Filled 2023-05-22: qty 100

## 2023-05-22 MED ORDER — LEVETIRACETAM 500 MG PO TABS: 1000.0000 mg | ORAL_TABLET | Freq: Once | ORAL | Status: DC

## 2023-05-22 NOTE — ED Provider Notes (Signed)
[place] EMERGENCY DEPARTMENT AT [facility] Provider Note  CSN: [id number] Arrival date & time: 05/21/23 1910  Chief Complaint(s) Chest Pain  HPI John Vaughan is a 32 y.o. male with a past medical history listed below including polysubstance use disorder, alcohol use disorder, seizures on Keppra who presents to the emergency department for chest pain.  Patient reports that he was drinking heavily yesterday and for memorial day and reports drinking 24 each 12 ounce cans of beer.  He denied any other illicit drug use.  States that he awoke today around noon and noted that he had left-sided numbness.  He thought he was having withdrawal symptoms so he took Klonopin, his Keppra with minimal relief.  He then began having chest pain that has been constant since onset but fluctuating in intensity.  Worse with movement.  Some shortness of breath.  He reports baseline nausea without emesis.  He reports that he has been not compliant with his Keppra over the past 4 days.  States that he has been drinking more than usual during that timeframe.  Denies any recent fevers or infections.  No coughing or congestion.  No other physical complaints.  At baseline he states that he has BLE numbness below the knee. Seeing Neurology for this.  Patient's girlfriend reports that patient had several episodes where he would stare off into space and stop breathing.  This lasted a few seconds at a time.  The history is provided by the patient.    Past Medical History Past Medical History:  Diagnosis Date   Alcohol abuse    Anxiety    Bipolar 2 disorder (HCC)    Depression    Seizures (HCC)    Patient Active Problem List   Diagnosis Date Noted   Substance induced mood disorder (HCC) 04/25/2023   Cocaine use 08/26/2022   History of seizures 08/26/2022   MDD (major depressive disorder), recurrent, severe, with psychosis (HCC) 06/16/2022   Compression fracture of T7 vertebra (HCC) 03/05/2021    COVID-19 virus infection 03/05/2021   Chronic low back pain 07/14/2020   Personal history of scoliosis 07/14/2020   Cannabis use disorder, severe, dependence (HCC) 06/06/2020   Homicidal ideation    Thrombocytopenia (HCC) 04/09/2020   Benzodiazepine dependence (HCC) 11/17/2019   Drug withdrawal seizure without complication (HCC) 11/17/2019   Elevated liver enzymes 11/17/2019   Tobacco abuse 11/17/2019   GAD (generalized anxiety disorder) 03/09/2019   Alcohol dependence (HCC) 03/09/2019   Reduced libido 04/17/2016   Home Medication(s) Prior to Admission medications   Medication Sig Start Date End Date Taking? Authorizing Provider  busPIRone (BUSPAR) 15 MG tablet Take 1 tablet (15 mg total) by mouth 3 (three) times daily. 04/15/23   Shanna Cisco, NP  famotidine (PEPCID) 20 MG tablet Take 1 tablet (20 mg total) by mouth 2 (two) times daily. 04/09/23   Roxy Horseman, PA-C  FLUoxetine (PROZAC) 40 MG capsule Take 1 capsule (40 mg total) by mouth daily. 04/15/23   Shanna Cisco, NP  gabapentin (NEURONTIN) 300 MG capsule Take 2 capsules (600 mg total) by mouth 3 (three) times daily. 04/15/23   Shanna Cisco, NP  haloperidol (HALDOL) 2 MG tablet Take 1 tablet (2 mg total) by mouth daily. 04/15/23   Shanna Cisco, NP  hydrOXYzine (ATARAX) 25 MG tablet Take 1 tablet (25 mg total) by mouth 3 (three) times daily as needed for anxiety (Sleep). 04/15/23   Shanna Cisco, NP  levETIRAcetam (KEPPRA) 500 MG  tablet Take 1 tablet (500 mg total) by mouth 2 (two) times daily. 04/18/23   Rolan Bucco, MD  ondansetron (ZOFRAN-ODT) 4 MG disintegrating tablet Take 1 tablet (4 mg total) by mouth every 8 (eight) hours as needed for nausea or vomiting. 04/09/23   Roxy Horseman, PA-C  pantoprazole (PROTONIX) 40 MG tablet Take 1 tablet (40 mg total) by mouth daily. 04/01/23   Sarita Bottom, MD  traZODone (DESYREL) 150 MG tablet Take 1 tablet (150 mg total) by mouth at bedtime. 04/15/23    Shanna Cisco, NP                                                                                                                                    Allergies Patient has no known allergies.  Review of Systems Review of Systems As noted in HPI  Physical Exam Vital Signs  I have reviewed the triage vital signs BP 118/78   Pulse 67   Temp 98.3 F (36.8 C)   Resp 10   SpO2 98%   Physical Exam Vitals reviewed.  Constitutional:      General: He is not in acute distress.    Appearance: He is well-developed. He is not diaphoretic.  HENT:     Head: Normocephalic and atraumatic.     Right Ear: External ear normal.     Left Ear: External ear normal.     Nose: Nose normal.     Mouth/Throat:     Mouth: Mucous membranes are moist.  Eyes:     General: No scleral icterus.    Conjunctiva/sclera: Conjunctivae normal.  Neck:     Trachea: Phonation normal.  Cardiovascular:     Rate and Rhythm: Normal rate and regular rhythm.  Pulmonary:     Effort: Pulmonary effort is normal. No respiratory distress.     Breath sounds: No stridor.  Chest:     Chest wall: Tenderness present.    Abdominal:     General: There is no distension.     Tenderness: There is abdominal tenderness in the epigastric area.  Musculoskeletal:        General: Normal range of motion.     Cervical back: Normal range of motion.  Neurological:     Mental Status: He is alert and oriented to person, place, and time.     Comments: Mental Status:  Alert and oriented to person, place, and time.  Attention and concentration normal.  Speech clear.   Cranial Nerves:  II Visual Fields: Intact to confrontation. Visual fields intact. III, IV, VI: Pupils equal and reactive to light and near. Full eye movement without nystagmus  V Facial Sensation: Reports left face numbness. No weakness of masticatory muscles  VII: No facial weakness or asymmetry  VIII Auditory Acuity: Grossly normal  IX/X: The uvula is midline;  the palate elevates symmetrically  XI: Normal sternocleidomastoid and trapezius strength  XII: The tongue is midline.  No atrophy or fasciculations.   Motor System: Muscle Strength: 5/5 and symmetric in the upper and lower extremities. No pronation or drift.  Muscle Tone: Tone and muscle bulk are normal in the upper and lower extremities.  Reflexes: No Clonus Coordination: Intact finger-to-nose, heel-to-shin. No tremor.  Sensation: diminished to BLE below knees    Psychiatric:        Behavior: Behavior normal.     ED Results and Treatments Labs (all labs ordered are listed, but only abnormal results are displayed) Labs Reviewed  BASIC METABOLIC PANEL - Abnormal; Notable for the following components:      Result Value   CO2 20 (*)    All other components within normal limits  CBC  LIPASE, BLOOD  HEPATIC FUNCTION PANEL  TROPONIN I (HIGH SENSITIVITY)  TROPONIN I (HIGH SENSITIVITY)                                                                                                                         EKG  EKG Interpretation  Date/Time:  Tuesday May 21 2023 19:45:46 EDT Ventricular Rate:  95 PR Interval:  168 QRS Duration: 106 QT Interval:  346 QTC Calculation: 434 R Axis:   11 Text Interpretation: Normal sinus rhythm Incomplete right bundle branch block Borderline ECG When compared with ECG of 24-Apr-2023 21:08, PREVIOUS ECG IS PRESENT No significant change was found Confirmed by Drema Pry [phone]) on 05/21/2023 11:50:53 PM       Radiology DG Chest 2 View  Result Date: 05/21/2023 CLINICAL DATA:  Chest pain. EXAM: CHEST - 2 VIEW COMPARISON:  Portable chest 08/25/2022, PA Lat 05/01/2022 FINDINGS: The heart size and mediastinal contours are within normal limits. Both lungs are clear. The visualized skeletal structures are stable, with again noted mild chronic compression fracture at T6 and moderate chronic compression fracture of T7. IMPRESSION: No evidence of acute chest  disease or interval changes. Electronically Signed   By: Almira Bar M.D.   On: 05/21/2023 20:20    Medications Ordered in ED Medications  alum & mag hydroxide-simeth (MAALOX/MYLANTA) 200-200-20 MG/5ML suspension 30 mL (30 mLs Oral Given 05/22/23 0054)    And  lidocaine (XYLOCAINE) 2 % viscous mouth solution 15 mL (15 mLs Oral Given 05/22/23 0052)  levETIRAcetam (KEPPRA) IVPB 1000 mg/100 mL premix (0 mg Intravenous Stopped 05/22/23 0140)  sodium chloride 0.9 % bolus 1,000 mL (0 mLs Intravenous Stopped 05/22/23 0140)  ondansetron (ZOFRAN) injection 4 mg (4 mg Intravenous Given 05/22/23 0052)   Procedures Procedures  (including critical care time) Medical Decision Making / ED Course  Click here for ABCD2, HEART and other calculators  Medical Decision Making Amount and/or Complexity of Data Reviewed Labs: ordered. Radiology: ordered.  Risk OTC drugs. Prescription drug management.    The patient presents to the ED for the following: Chest pain Left-sided paresthesias   This involves an extensive number of treatment options, and is a complaint that carries with it a high risk of complications and  morbidity. The differential diagnosis includes but not limited to:  Gastritis/esophagitis, pancreatitis, biliary disease, will assess for ACS.  Low suspicion for pulmonary embolism or aortic dissection.  Chest x-ray will evaluate for pneumonia, pneumothorax.  Will also assess for any rib fractures. Will assess for electrolyte derangements, metabolic derangements.  No focal deficits noted on exam concerning for CVA.  Co-morbidities/SDOH that complicate the patient evaluation/care: Noted above in HPI   Work up Interpretation and Management:  Cardiac Monitoring/EKG: EKG without acute ischemic changes, dysrhythmias or blocks  Laboratory Tests ordered listed below with my independent interpretation: CBC without leukocytosis or anemia Metabolic panel without significant electrolyte  derangements or renal sufficiency No evidence of bili obstruction or pancreatitis Serial troponins negative x 2    Imaging Studies ordered listed below with my independent interpretation: Chest x-ray without evidence of pneumonia, pneumothorax, pulmonary edema pleural effusions.  No acute fractures noted.  Patient does have chronic T6 and T7 compression fractures.  Unchanged from prior.  ED Course: Patient loaded with Keppra. Given GI cocktail without relief.  Chest pain is likely chest wall pain given his reproducible nature. Patient able to tolerate p.o. intake. Recommended close follow-up with PCP and neurology.      Final Clinical Impression(s) / ED Diagnoses Final diagnoses:  Chest wall pain  Witnessed seizure-like activity (HCC)   The patient appears reasonably screened and/or stabilized for discharge and I doubt any other medical condition or other [facility] requiring further screening, evaluation, or treatment in the ED at this time. I have discussed the findings, Dx and Tx plan with the patient/family who expressed understanding and agree(s) with the plan. Discharge instructions discussed at length. The patient/family was given strict return precautions who verbalized understanding of the instructions. No further questions at time of discharge.  Disposition: Discharge  Condition: Good  ED Discharge Orders     None       Follow Up: Primary care provider  Schedule an appointment as soon as possible for a visit  if you do not have a primary care physician, contact [facility] at [phone] for referral  Neurology  Call  to schedule an appointment for close follow up           This chart was dictated using voice recognition software.  Despite best efforts to proofread,  errors can occur which can change the documentation meaning.    Nira Conn, MD 05/22/23 [phone]

## 2023-06-08 ENCOUNTER — Emergency Department (HOSPITAL_COMMUNITY)
Admission: EM | Admit: 2023-06-08 | Discharge: 2023-06-09 | Disposition: A | Discharge status: Other Acute Inpatient Hospital | Payer: Medicaid Other | Attending: Emergency Medicine | Admitting: Emergency Medicine

## 2023-06-08 ENCOUNTER — Encounter (HOSPITAL_COMMUNITY): Payer: Self-pay

## 2023-06-08 ENCOUNTER — Other Ambulatory Visit: Payer: Self-pay

## 2023-06-08 DIAGNOSIS — Y906 Blood alcohol level of 120-199 mg/100 ml: Secondary | ICD-10-CM | POA: Diagnosis not present

## 2023-06-08 DIAGNOSIS — F1020 Alcohol dependence, uncomplicated: Secondary | ICD-10-CM | POA: Diagnosis not present

## 2023-06-08 DIAGNOSIS — Z20822 Contact with and (suspected) exposure to covid-19: Secondary | ICD-10-CM | POA: Diagnosis not present

## 2023-06-08 DIAGNOSIS — Z79899 Other long term (current) drug therapy: Secondary | ICD-10-CM | POA: Diagnosis not present

## 2023-06-08 DIAGNOSIS — F332 Major depressive disorder, recurrent severe without psychotic features: Secondary | ICD-10-CM | POA: Diagnosis not present

## 2023-06-08 DIAGNOSIS — F32A Depression, unspecified: Secondary | ICD-10-CM | POA: Diagnosis present

## 2023-06-08 DIAGNOSIS — R45851 Suicidal ideations: Secondary | ICD-10-CM | POA: Diagnosis not present

## 2023-06-08 DIAGNOSIS — F1010 Alcohol abuse, uncomplicated: Secondary | ICD-10-CM

## 2023-06-08 LAB — CBC WITH DIFFERENTIAL/PLATELET
Abs Immature Granulocytes: 0.03 10*3/uL (ref 0.00–0.07)
Basophils Absolute: 0.1 10*3/uL (ref 0.0–0.1)
Basophils Relative: 1 %
Eosinophils Absolute: 0.1 10*3/uL (ref 0.0–0.5)
Eosinophils Relative: 2 %
HCT: 42.8 % (ref 39.0–52.0)
Hemoglobin: 15.2 g/dL (ref 13.0–17.0)
Immature Granulocytes: 0 %
Lymphocytes Relative: 31 %
Lymphs Abs: 2.2 10*3/uL (ref 0.7–4.0)
MCH: 33.4 pg (ref 26.0–34.0)
MCHC: 35.5 g/dL (ref 30.0–36.0)
MCV: 94.1 fL (ref 80.0–100.0)
Monocytes Absolute: 0.7 10*3/uL (ref 0.1–1.0)
Monocytes Relative: 9 %
Neutro Abs: 4 10*3/uL (ref 1.7–7.7)
Neutrophils Relative %: 57 %
Platelets: 289 10*3/uL (ref 150–400)
RBC: 4.55 MIL/uL (ref 4.22–5.81)
RDW: 12 % (ref 11.5–15.5)
WBC: 7.1 10*3/uL (ref 4.0–10.5)
nRBC: 0 % (ref 0.0–0.2)

## 2023-06-08 LAB — SALICYLATE LEVEL: Salicylate Lvl: <7 mg/dL — ABNORMAL LOW (ref 7.0–30.0)

## 2023-06-08 LAB — COMPREHENSIVE METABOLIC PANEL
ALT: 24 U/L (ref 0–44)
AST: 23 U/L (ref 15–41)
Albumin: 4.2 g/dL (ref 3.5–5.0)
Alkaline Phosphatase: 45 U/L (ref 38–126)
Anion gap: 11 (ref 5–15)
BUN: <5 mg/dL — ABNORMAL LOW (ref 6–20)
CO2: 23 mmol/L (ref 22–32)
Calcium: 9.1 mg/dL (ref 8.9–10.3)
Chloride: 103 mmol/L (ref 98–111)
Creatinine, Ser: 1 mg/dL (ref 0.61–1.24)
GFR, Estimated: >60 mL/min (ref >60)
Glucose, Bld: 92 mg/dL (ref 70–99)
Potassium: 3.5 mmol/L (ref 3.5–5.1)
Sodium: 137 mmol/L (ref 135–145)
Total Bilirubin: 0.8 mg/dL (ref 0.3–1.2)
Total Protein: 7.3 g/dL (ref 6.5–8.1)

## 2023-06-08 LAB — RAPID URINE DRUG SCREEN, HOSP PERFORMED
Amphetamines: NOT DETECTED
Barbiturates: NOT DETECTED
Benzodiazepines: NOT DETECTED
Cocaine: NOT DETECTED
Opiates: NOT DETECTED
Tetrahydrocannabinol: NOT DETECTED

## 2023-06-08 LAB — RESP PANEL BY RT-PCR (RSV, FLU A&B, COVID)  RVPGX2
Influenza A by PCR: NEGATIVE
Influenza B by PCR: NEGATIVE
Resp Syncytial Virus by PCR: NEGATIVE
SARS Coronavirus 2 by RT PCR: NEGATIVE

## 2023-06-08 LAB — ACETAMINOPHEN LEVEL: Acetaminophen (Tylenol), Serum: <10 ug/mL — ABNORMAL LOW (ref 10–30)

## 2023-06-08 LAB — ETHANOL: Alcohol, Ethyl (B): 148 mg/dL — ABNORMAL HIGH (ref <10)

## 2023-06-08 MED ORDER — NICOTINE 21 MG/24HR TD PT24
21.0000 mg | MEDICATED_PATCH | Freq: Every day | TRANSDERMAL | Status: DC
  Administered 2023-06-09: 21 mg via TRANSDERMAL
  Filled 2023-06-08: qty 1

## 2023-06-08 MED ORDER — LORAZEPAM 2 MG/ML IJ SOLN: 0.0000 mg | Freq: Two times a day (BID) | INTRAMUSCULAR | Status: DC

## 2023-06-08 MED ORDER — LEVETIRACETAM 500 MG PO TABS
500.0000 mg | ORAL_TABLET | Freq: Once | ORAL | Status: AC
  Administered 2023-06-08: 500 mg via ORAL
  Filled 2023-06-08: qty 1

## 2023-06-08 MED ORDER — THIAMINE MONONITRATE 100 MG PO TABS
100.0000 mg | ORAL_TABLET | Freq: Every day | ORAL | Status: DC
  Administered 2023-06-09: 100 mg via ORAL
  Filled 2023-06-08: qty 1

## 2023-06-08 MED ORDER — LORAZEPAM 1 MG PO TABS: 0.0000 mg | ORAL_TABLET | Freq: Two times a day (BID) | ORAL | Status: DC

## 2023-06-08 MED ORDER — ALUM & MAG HYDROXIDE-SIMETH 200-200-20 MG/5ML PO SUSP: 30.0000 mL | Freq: Four times a day (QID) | ORAL | Status: DC | PRN

## 2023-06-08 MED ORDER — THIAMINE HCL 100 MG/ML IJ SOLN: 100.0000 mg | Freq: Every day | INTRAMUSCULAR | Status: DC

## 2023-06-08 MED ORDER — LORAZEPAM 1 MG PO TABS
0.0000 mg | ORAL_TABLET | Freq: Four times a day (QID) | ORAL | Status: DC
  Administered 2023-06-09 (×2): 1 mg via ORAL
  Filled 2023-06-08 (×2): qty 1

## 2023-06-08 MED ORDER — ONDANSETRON 4 MG PO TBDP
4.0000 mg | ORAL_TABLET | Freq: Once | ORAL | Status: AC
  Administered 2023-06-08: 4 mg via ORAL
  Filled 2023-06-08: qty 1

## 2023-06-08 MED ORDER — LORAZEPAM 2 MG/ML IJ SOLN: 0.0000 mg | Freq: Four times a day (QID) | INTRAMUSCULAR | Status: DC

## 2023-06-08 MED ORDER — ONDANSETRON HCL 4 MG PO TABS: 4.0000 mg | ORAL_TABLET | Freq: Three times a day (TID) | ORAL | Status: DC | PRN

## 2023-06-08 MED ORDER — ACETAMINOPHEN 325 MG PO TABS: 650.0000 mg | ORAL_TABLET | ORAL | Status: DC | PRN

## 2023-06-08 NOTE — ED Notes (Signed)
Seizure pads applied to bed.  

## 2023-06-08 NOTE — ED Notes (Signed)
Pt was changed out into purple scrubs and security was called for Pt to be wanded.
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
## 2023-06-08 NOTE — ED Notes (Signed)
Pt belongings were placed in the nurses station cabinet on the 23-25 side.
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
## 2023-06-09 ENCOUNTER — Encounter: Payer: Self-pay | Admitting: Nurse Practitioner

## 2023-06-09 ENCOUNTER — Other Ambulatory Visit: Payer: Self-pay

## 2023-06-09 ENCOUNTER — Inpatient Hospital Stay
Admission: AD | Admit: 2023-06-09 | Discharge: 2023-06-12 | DRG: 885 | Disposition: A | Discharge status: Home / Self Care | Payer: Medicaid Other | Source: Intra-hospital | Attending: Psychiatry | Admitting: Psychiatry

## 2023-06-09 DIAGNOSIS — F17210 Nicotine dependence, cigarettes, uncomplicated: Secondary | ICD-10-CM | POA: Diagnosis present

## 2023-06-09 DIAGNOSIS — F1011 Alcohol abuse, in remission: Secondary | ICD-10-CM

## 2023-06-09 DIAGNOSIS — Z5941 Food insecurity: Secondary | ICD-10-CM

## 2023-06-09 DIAGNOSIS — G40909 Epilepsy, unspecified, not intractable, without status epilepticus: Secondary | ICD-10-CM | POA: Diagnosis present

## 2023-06-09 DIAGNOSIS — F332 Major depressive disorder, recurrent severe without psychotic features: Principal | ICD-10-CM | POA: Diagnosis present

## 2023-06-09 DIAGNOSIS — Z5982 Transportation insecurity: Secondary | ICD-10-CM

## 2023-06-09 DIAGNOSIS — Z79899 Other long term (current) drug therapy: Secondary | ICD-10-CM

## 2023-06-09 DIAGNOSIS — F411 Generalized anxiety disorder: Secondary | ICD-10-CM | POA: Diagnosis present

## 2023-06-09 DIAGNOSIS — F1020 Alcohol dependence, uncomplicated: Secondary | ICD-10-CM | POA: Diagnosis present

## 2023-06-09 DIAGNOSIS — Z818 Family history of other mental and behavioral disorders: Secondary | ICD-10-CM | POA: Diagnosis not present

## 2023-06-09 DIAGNOSIS — R45851 Suicidal ideations: Secondary | ICD-10-CM | POA: Diagnosis present

## 2023-06-09 DIAGNOSIS — F319 Bipolar disorder, unspecified: Secondary | ICD-10-CM | POA: Diagnosis present

## 2023-06-09 DIAGNOSIS — Z1152 Encounter for screening for COVID-19: Secondary | ICD-10-CM

## 2023-06-09 DIAGNOSIS — F32A Depression, unspecified: Secondary | ICD-10-CM

## 2023-06-09 DIAGNOSIS — Z87898 Personal history of other specified conditions: Secondary | ICD-10-CM

## 2023-06-09 MED ORDER — BUSPIRONE HCL 10 MG PO TABS
15.0000 mg | ORAL_TABLET | Freq: Three times a day (TID) | ORAL | Status: DC
  Administered 2023-06-09: 15 mg via ORAL
  Filled 2023-06-09: qty 2

## 2023-06-09 MED ORDER — DIPHENHYDRAMINE HCL 25 MG PO CAPS
50.0000 mg | ORAL_CAPSULE | Freq: Three times a day (TID) | ORAL | Status: DC | PRN
  Administered 2023-06-12: 50 mg via ORAL
  Filled 2023-06-09: qty 2

## 2023-06-09 MED ORDER — THIAMINE HCL 100 MG/ML IJ SOLN
100.0000 mg | Freq: Every day | INTRAMUSCULAR | Status: DC
  Filled 2023-06-09 (×4): qty 1

## 2023-06-09 MED ORDER — HALOPERIDOL LACTATE 5 MG/ML IJ SOLN: 5.0000 mg | Freq: Three times a day (TID) | INTRAMUSCULAR | Status: DC | PRN

## 2023-06-09 MED ORDER — PANTOPRAZOLE SODIUM 40 MG PO TBEC
40.0000 mg | DELAYED_RELEASE_TABLET | Freq: Every day | ORAL | Status: DC
  Administered 2023-06-10 – 2023-06-12 (×3): 40 mg via ORAL
  Filled 2023-06-09 (×4): qty 1

## 2023-06-09 MED ORDER — HALOPERIDOL 1 MG PO TABS
2.0000 mg | ORAL_TABLET | Freq: Every day | ORAL | Status: DC
  Administered 2023-06-09: 2 mg via ORAL
  Filled 2023-06-09: qty 2

## 2023-06-09 MED ORDER — GABAPENTIN 300 MG PO CAPS
600.0000 mg | ORAL_CAPSULE | Freq: Three times a day (TID) | ORAL | Status: DC
  Administered 2023-06-09 – 2023-06-12 (×8): 600 mg via ORAL
  Filled 2023-06-09 (×8): qty 2

## 2023-06-09 MED ORDER — MAGNESIUM HYDROXIDE 400 MG/5ML PO SUSP: 30.0000 mL | Freq: Every day | ORAL | Status: DC | PRN

## 2023-06-09 MED ORDER — HYDROXYZINE HCL 25 MG PO TABS: 25.0000 mg | ORAL_TABLET | Freq: Three times a day (TID) | ORAL | Status: DC | PRN

## 2023-06-09 MED ORDER — PANTOPRAZOLE SODIUM 40 MG PO TBEC
40.0000 mg | DELAYED_RELEASE_TABLET | Freq: Every day | ORAL | Status: DC
  Administered 2023-06-09: 40 mg via ORAL
  Filled 2023-06-09: qty 1

## 2023-06-09 MED ORDER — GABAPENTIN 300 MG PO CAPS
600.0000 mg | ORAL_CAPSULE | Freq: Three times a day (TID) | ORAL | Status: DC
  Administered 2023-06-09: 600 mg via ORAL
  Filled 2023-06-09: qty 2

## 2023-06-09 MED ORDER — THIAMINE MONONITRATE 100 MG PO TABS
100.0000 mg | ORAL_TABLET | Freq: Every day | ORAL | Status: DC
  Administered 2023-06-10 – 2023-06-12 (×3): 100 mg via ORAL
  Filled 2023-06-09 (×3): qty 1

## 2023-06-09 MED ORDER — TRAZODONE HCL 50 MG PO TABS
50.0000 mg | ORAL_TABLET | Freq: Every evening | ORAL | Status: DC | PRN
  Administered 2023-06-09: 50 mg via ORAL
  Filled 2023-06-09: qty 1

## 2023-06-09 MED ORDER — DIPHENHYDRAMINE HCL 50 MG/ML IJ SOLN: 50.0000 mg | Freq: Three times a day (TID) | INTRAMUSCULAR | Status: DC | PRN

## 2023-06-09 MED ORDER — TRAZODONE HCL 50 MG PO TABS: 150.0000 mg | ORAL_TABLET | Freq: Every day | ORAL | Status: DC

## 2023-06-09 MED ORDER — BUSPIRONE HCL 5 MG PO TABS
15.0000 mg | ORAL_TABLET | Freq: Three times a day (TID) | ORAL | Status: DC
  Administered 2023-06-09 – 2023-06-12 (×8): 15 mg via ORAL
  Filled 2023-06-09 (×8): qty 3

## 2023-06-09 MED ORDER — NICOTINE 14 MG/24HR TD PT24: 14.0000 mg | MEDICATED_PATCH | Freq: Every day | TRANSDERMAL | Status: DC

## 2023-06-09 MED ORDER — LEVETIRACETAM 500 MG PO TABS
500.0000 mg | ORAL_TABLET | Freq: Two times a day (BID) | ORAL | Status: DC
  Administered 2023-06-09 – 2023-06-12 (×6): 500 mg via ORAL
  Filled 2023-06-09 (×7): qty 1

## 2023-06-09 MED ORDER — ACETAMINOPHEN 325 MG PO TABS: 650.0000 mg | ORAL_TABLET | Freq: Four times a day (QID) | ORAL | Status: DC | PRN

## 2023-06-09 MED ORDER — HALOPERIDOL 5 MG PO TABS: 5.0000 mg | ORAL_TABLET | Freq: Three times a day (TID) | ORAL | Status: DC | PRN

## 2023-06-09 MED ORDER — FLUOXETINE HCL 20 MG PO CAPS
40.0000 mg | ORAL_CAPSULE | Freq: Every day | ORAL | Status: DC
  Administered 2023-06-09: 40 mg via ORAL
  Filled 2023-06-09: qty 2

## 2023-06-09 MED ORDER — ALUM & MAG HYDROXIDE-SIMETH 200-200-20 MG/5ML PO SUSP: 30.0000 mL | ORAL | Status: DC | PRN

## 2023-06-09 MED ORDER — LEVETIRACETAM 500 MG PO TABS
500.0000 mg | ORAL_TABLET | Freq: Two times a day (BID) | ORAL | Status: DC
  Administered 2023-06-09: 500 mg via ORAL
  Filled 2023-06-09: qty 1

## 2023-06-09 MED ORDER — ONDANSETRON 4 MG PO TBDP: 4.0000 mg | ORAL_TABLET | Freq: Three times a day (TID) | ORAL | Status: DC | PRN

## 2023-06-09 MED ORDER — FLUOXETINE HCL 20 MG PO CAPS
40.0000 mg | ORAL_CAPSULE | Freq: Every day | ORAL | Status: DC
  Administered 2023-06-10 – 2023-06-12 (×3): 40 mg via ORAL
  Filled 2023-06-09 (×3): qty 2

## 2023-06-09 MED ORDER — ADULT MULTIVITAMIN W/MINERALS CH
1.0000 | ORAL_TABLET | Freq: Every day | ORAL | Status: DC
  Administered 2023-06-09 – 2023-06-12 (×4): 1 via ORAL
  Filled 2023-06-09 (×4): qty 1

## 2023-06-09 MED ORDER — FOLIC ACID 1 MG PO TABS
1.0000 mg | ORAL_TABLET | Freq: Every day | ORAL | Status: DC
  Administered 2023-06-09 – 2023-06-12 (×4): 1 mg via ORAL
  Filled 2023-06-09 (×4): qty 1

## 2023-06-09 MED ORDER — FAMOTIDINE 20 MG PO TABS
20.0000 mg | ORAL_TABLET | Freq: Two times a day (BID) | ORAL | Status: DC
  Administered 2023-06-09: 20 mg via ORAL
  Filled 2023-06-09: qty 1

## 2023-06-09 MED ORDER — LORAZEPAM 2 MG/ML IJ SOLN: 1.0000 mg | INTRAMUSCULAR | Status: DC | PRN

## 2023-06-09 MED ORDER — NICOTINE 21 MG/24HR TD PT24
21.0000 mg | MEDICATED_PATCH | Freq: Every day | TRANSDERMAL | Status: DC
  Administered 2023-06-10 – 2023-06-11 (×2): 21 mg via TRANSDERMAL
  Filled 2023-06-09 (×3): qty 1

## 2023-06-09 MED ORDER — LORAZEPAM 1 MG PO TABS
1.0000 mg | ORAL_TABLET | ORAL | Status: DC | PRN
  Administered 2023-06-09 – 2023-06-11 (×3): 1 mg via ORAL
  Filled 2023-06-09 (×3): qty 1

## 2023-06-09 NOTE — ED Notes (Addendum)
Pt. Accepted to [facility] inpatient BH bed will be ready at 10am, report can be called to [phone] , pending voluntary consent and orders.

## 2023-06-09 NOTE — ED Notes (Signed)
[facility] called to have patient transported 

## 2023-06-09 NOTE — Plan of Care (Signed)
  Problem: Education: Goal: Knowledge of [facility] Education information/materials will improve Outcome: Progressing Goal: Emotional status will improve Outcome: Not Progressing Goal: Mental status will improve 06/09/2023 1324 by Berkley Harvey, RN Outcome: Not Progressing 06/09/2023 1323 by Addison Naegeli I, RN Outcome: Not Progressing Goal: Verbalization of understanding the information provided will improve 06/09/2023 1324 by Berkley Harvey, RN Outcome: Not Progressing 06/09/2023 1323 by Addison Naegeli I, RN Outcome: Not Progressing   Problem: Health Behavior/Discharge Planning: Goal: Identification of resources available to assist in meeting health care needs will improve Outcome: Not Progressing Goal: Compliance with treatment plan for underlying cause of condition will improve Outcome: Not Progressing  New Admission

## 2023-06-09 NOTE — Progress Notes (Signed)
Pt was accepted to [facility] Address: [address] [place], [place] [address] TODAY 06/09/2023; Bed Assignment 304 PENDING signed voluntary consent  faxed to [facility] [phone]  Pt meets inpatient criteria per [name], NP  Attending Physician will be Dr. Glo Herring  Report can be called to: - [phone]  Pt can arrive after 10:00am ; [facility] and [facility] Charge RN to coordinate.   Care Team notified: Serafina Mitchell, PMHNP, Day [facility] Sharyne Peach, RN    [place], [place] 06/09/2023 @ 9:12 AM

## 2023-06-09 NOTE — ED Notes (Signed)
Pt belongings transferred to TCU and placed in locker 31.

## 2023-06-09 NOTE — ED Notes (Signed)
Pt completing tele assessment at this time

## 2023-06-09 NOTE — Tx Team (Signed)
Initial Treatment Plan 06/09/2023 1:19 PM [name] [id number]    PATIENT STRESSORS: Financial difficulties   Marital or family conflict   Substance abuse     PATIENT STRENGTHS: Ability for insight  Active sense of humor  Capable of independent living    PATIENT IDENTIFIED PROBLEMS: Unstable Mood  Ineffective coping skills                   DISCHARGE CRITERIA:  Ability to meet basic life and health needs Adequate post-discharge living arrangements Improved stabilization in mood, thinking, and/or behavior  PRELIMINARY DISCHARGE PLAN: Placement in alternative living arrangements Return to previous living arrangement Return to previous work or school arrangements  PATIENT/FAMILY INVOLVEMENT: This treatment plan has been presented to and reviewed with the patient, John Vaughan.  The patient and family have been given the opportunity to ask questions and make suggestions.  Berkley Harvey, RN 06/09/2023, 1:19 PM

## 2023-06-09 NOTE — Progress Notes (Signed)
Pt was provided with suicide prevention plan paperwork to be completed.

## 2023-06-09 NOTE — ED Provider Notes (Signed)
Emergency Medicine Observation Re-evaluation Note  John Vaughan is a 32 y.o. male, seen on rounds today.  Pt initially presented to the ED for complaints of Suicidal Presented with depressive symptoms, SI, alcohol use, substance abuse.  Currently, the patient is inpatient treatment.  Physical Exam  BP (!) 90/59 (BP Location: Right Arm)   Pulse 69   Temp 97.7 F (36.5 C) (Oral)   Resp 17   Ht 6' (1.829 m)   Wt 94.3 kg   SpO2 97%   BMI 28.21 kg/m  Physical Exam General: NAD Cardiac: RR Lungs: even,unlabored   ED Course / MDM  EKG:   I have reviewed the labs performed to date as well as medications administered while in observation.  Recent changes in the last 24 hours include none.  Plan  Current plan is for inpatient treatment.  Transferred to [facility] for further care.     Alvira Monday, MD 06/10/23 [phone]

## 2023-06-09 NOTE — [facility] Counselor (Signed)
Adult Comprehensive Assessment  Patient ID: John Vaughan, male   DOB: 1991/04/11, 32 y.o.   MRN: [id number]  Information Source: Information source: Patient  Current Stressors:  Patient states their primary concerns and needs for treatment are:: The patient stated that he would like to get back on his medication. Patient states their goals for this hospitilization and ongoing recovery are:: The patient stated staying on track and stop using alcohol is his goal. Educational / Learning stressors: None Employment / Job issues: The patient stated that it is hard to get a job and that he cant get hired because of health issues. Family Relationships: None Financial / Lack of resources (include bankruptcy): The patient stated that he is not able to work. Housing / Lack of housing: none Physical health (include injuries & life threatening diseases): The patient stated that he fractured a disk in his back. Social relationships: none Substance abuse: none Bereavement / Loss: none  Living/Environment/Situation:  Living Arrangements: Spouse/significant other, Parent Living conditions (as described by patient or guardian): The patient stated that its normal for living with family but can have drama. Who else lives in the home?: The patient stated that its his mother and partner. How long has patient lived in current situation?: The patient stated that he has lived there his whole life. What is atmosphere in current home: Other (Comment) (The patient described it as confussing.)  Family History:  Marital status: Long term relationship Long term relationship, how long?: the patient stated 2 months. What types of issues is patient dealing with in the relationship?: The patient stated that the relationship is good. Does patient have children?: Yes How many children?: 1  Childhood History:  By whom was/is the patient raised?: Both parents Description of patient's relationship with caregiver  when they were a child: The patient stated that he had a good relationship with his parents. Patient's description of current relationship with people who raised him/her: The patient stated that his father past and he gets alone with his mother. Does patient have siblings?: Yes Number of Siblings: 2 Description of patient's current relationship with siblings: The patient stated that he has a good relationship with his parents. Did patient suffer any verbal/emotional/physical/sexual abuse as a child?: No Did patient suffer from severe childhood neglect?: No Has patient ever been sexually abused/assaulted/raped as an adolescent or adult?: No Was the patient ever a victim of a crime or a disaster?: No Witnessed domestic violence?: No Has patient been affected by domestic violence as an adult?: No  Education:  Highest grade of school patient has completed: The patient stated that he completed high school. Currently a student?: No Learning disability?: No  Employment/Work Situation:   Employment Situation: Unemployed Patient's Job has Been Impacted by Current Illness: Yes Describe how Patient's Job has Been Impacted: The patient stated tha his back problems and mental health has impacted him being able to work. 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?: No  Financial Resources:   Does patient have a Lawyer or guardian?: No  Alcohol/Substance Abuse:   What has been your use of drugs/alcohol within the last 12 months?: yes to alcohol, stating that he drnks about 10 bers a day. 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 How does patient's faith help to cope with current illness?: The patient stated that he prays.  Leisure/Recreation:   Do You Have Hobbies?: Yes Leisure and Hobbies: The patient stated that he plays the guitar.  Strengths/Needs:   Patient states these barriers may affect/interfere with their treatment: none Patient states these barriers may affect their return to the community: none  Discharge Plan:   Currently receiving community mental health services: No Patient states concerns and preferences for aftercare planning are: The patient stated that he would like to be setup with a psychiatrist after discharge. Patient states they will know when they are safe and ready for discharge when: the patient stated he will know when medication start working. Does patient have access to transportation?: Yes Does patient have financial barriers related to discharge medications?: Yes Patient description of barriers related to discharge medications: the patient stated his finances. Will patient be returning to same living situation after discharge?: Yes  Summary/Recommendations:   Summary and Recommendations (to be completed by the evaluator): The patient is a 32 year old male that lives in [place] [facility] [place]) who presents unaccompanied to [facility] reporting depressive symptoms, anxiety, suicidal ideation, and alcohol use. Pt's medical record indicates a diagnosis of major depressive disorder and substance use. He says one week ago he returned to using alcohol and has been drinking approximately 144 ounce of beer daily. He has a history of using cocaine, and benzodiazepines but denies using substances other than alcohol. Pt's urine drug screen is negative and blood alcohol level is 148. He states he has been diagnosed with epilepsy and has a history of alcohol withdrawal seizures. Pt reports recurrent suicidal ideation and says he planned to kill himself today  by walking into traffic. He says he stopped himself and went to a store to purchase alcohol instead. He continues to endorse suicidal ideation with plan to walk into traffic. Pt states he has attempted suicide in the past and medical record indicates a history of cutting his wrist. The patient would like to be setup with a psychiatrist. Recommendations include: crisis stabilization, therapeutic milieu, encourage group attendance and participation, medication management for mood stabilization and development of comprehensive mental wellness/ sobriety plan.  Marshell Levan. 06/09/2023

## 2023-06-09 NOTE — Progress Notes (Signed)
The patient is requesting an increase in his nighttime Trazodone to 150mg , stating he is prescribed this dose on an outpatient basis.

## 2023-06-09 NOTE — Progress Notes (Signed)
D: Patient is newly admitted to [facility]. Patient's skin assessment completed with Darl Pikes RN, skin is intact, no contraband found. Patient reports active SI . Denies HI / AVH. Patient reports ETOH / Smoking. Patient has bilateral ear gauges. Discussed with charge nurse. Patient will be allowed to keep because they do not meet the exclusionary criteria. Gauges are ear rings are solid rings without clasp and no sharp. They are the size of average finger ring similar to a wedding ring. Provider was made aware of admission.   A: Patient oriented to unit/room/call light. Patient was offered support and encouragement. Patient was encourage to attend groups, participate in unit activities and continue with plan of care. Q x 15 minute observation checks were completed for safety.   R: Patient has no complaints at this time. Patient is receptive to treatment and safety maintained on unit.
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
## 2023-06-09 NOTE — ED Notes (Signed)
RN called [facility] and gave report to [facility]

## 2023-06-09 NOTE — Progress Notes (Signed)
The patient watched television and socialized with peers up until 2300.  His mood and affect appeared euthymic and the patient was observed laughing and joking with others.  Hussain did not show significant signs of alcohol withdrawal.  He denied suicidal thoughts and was pleasant with staff.  The patient appeared to enjoy a visit from his girlfriend.  No significant concerns to note.    The patient retreated to his room at 2300.  Safety monitoring with continue.

## 2023-06-09 NOTE — BH Assessment (Signed)
Spoke with Pt's RN. She says according to the charge RN and Diplomatic Services operational officer there is only one tele-cart at [facility] and it is currently inoperable. IT has been contacted. Pt will be assessed when machine is operational.   Pamalee Leyden, [facility], [facility] Triage Specialist [phone]

## 2023-06-09 NOTE — Group Note (Unsigned)
Date:  06/09/2023 Time:  9:13 PM  Group Topic/Focus:  Building Self Esteem:   The Focus of this group is helping patients become aware of the effects of self-esteem on their lives, the things they and others do that enhance or undermine their self-esteem, seeing the relationship between their level of self-esteem and the choices they make and learning ways to enhance self-esteem.     Participation Level:  {BHH PARTICIPATION ZOXWR:60454}  Participation Quality:  {BHH PARTICIPATION QUALITY:22265}  Affect:  {BHH AFFECT:22266}  Cognitive:  {BHH COGNITIVE:22267}  Insight: {BHH Insight2:20797}  Engagement in Group:  {BHH ENGAGEMENT IN UJWJX:91478}  Modes of Intervention:  {BHH MODES OF INTERVENTION:22269}  Additional Comments:  ***  Olive Motyka 06/09/2023, 9:13 PM

## 2023-06-10 DIAGNOSIS — F332 Major depressive disorder, recurrent severe without psychotic features: Secondary | ICD-10-CM | POA: Diagnosis not present

## 2023-06-10 MED ORDER — TRAZODONE HCL 50 MG PO TABS
150.0000 mg | ORAL_TABLET | Freq: Every evening | ORAL | Status: DC | PRN
  Administered 2023-06-10 – 2023-06-11 (×2): 150 mg via ORAL
  Filled 2023-06-10 (×2): qty 1

## 2023-06-10 NOTE — Group Note (Signed)
Date:  06/10/2023 Time:  10:14 PM  Group Topic/Focus:  Wrap-Up Group:   The focus of this group is to help patients review their daily goal of treatment and discuss progress on daily workbooks.    Participation Level:  Active  Participation Quality:  Appropriate  Affect:  Appropriate  Cognitive:  Appropriate  Insight: Good  Engagement in Group:  Engaged  Modes of Intervention:  Discussion  Additional Comments:     Sheilla Maris 06/10/2023, 10:14 PM

## 2023-06-10 NOTE — Progress Notes (Signed)
Patient calm and pleasant during assessment denying SI/HI/AVH. Pt endorses depression. Pt observed interacting appropriately with staff and peers on the unit. Pt compliant with medication administration per MD orders. Pt given education, support, and encouragement to be active in his treatment plan. Pt being monitored Q 15 minutes for safety per unit protocol, remains safe on the unit.  

## 2023-06-10 NOTE — Group Note (Signed)
Recreation Therapy Group Note   Group Topic:Goal Setting  Group Date: 06/10/2023 Start Time: 1000 End Time: 1100 Facilitators: Rosina Lowenstein, LRT, CTRS Location:  Craft Room  Group Description: Scientist, research (physical sciences). Patients were given many different magazines, a glue stick, markers, and a piece of cardstock paper. LRT and pts discussed the importance of having goals in life. LRT and pts discussed the difference between short-term and long-term goals, as well as what a SMART goal is. LRT encouraged pts to create a vision board, with images they picked and then cut out with safety scissors from the magazine, for themselves, that capture their short and long-term goals. LRT encouraged pts to show and explain their vision board to the group. LRT offered to laminate vision board once dry and complete.   Goal Area(s) Addressed:  Patient will gain knowledge of short vs. long term goals.  Patient will identify goals for themselves. Patient will practice setting SMART goals. Patient will verbalize their goals to LRT and peers.  Affect/Mood: Appropriate   Participation Level: Active and Engaged   Participation Quality: Independent   Behavior: Calm and Cooperative   Speech/Thought Process: Coherent   Insight: Good   Judgement: Good   Modes of Intervention: Art   Patient Response to Interventions:  Attentive, Engaged, Interested , and Receptive   Education Outcome:  Acknowledges education   Clinical Observations/Individualized Feedback: John Vaughan was active in their participation of session activities and group discussion. Pt identified "I want a nicer house, to continue working on my garden I just made, work on my cars and play the guitar more" as his goals. Pt appropriately cut images out that reflected identified goals. Pt interacted well with LRT and peers duration of session.    Plan: Continue to engage patient in RT group sessions 2-3x/week.   Rosina Lowenstein, LRT, CTRS 06/10/2023 11:33  AM

## 2023-06-10 NOTE — H&P (Signed)
Psychiatric Admission Assessment Adult  Patient Identification: John Vaughan MRN:  [id number] Date of Evaluation:  06/10/2023 Chief Complaint:  Bipolar disorder (HCC) [F31.9] Principal Diagnosis: Severe recurrent major depression without psychotic features (HCC) Diagnosis:  Principal Problem:   Severe recurrent major depression without psychotic features (HCC) Active Problems:   Alcohol dependence (HCC)   History of seizures  History of Present Illness: Patient seen and chart reviewed.  This is a 32 year old man with a history of substance abuse and mood symptoms.  Came voluntarily to the emergency room in [place] reporting depression with suicidal ideation to walk in front of traffic.  Patient was intoxicated at the time.  He tells me today that his mood has been much worse the last couple weeks.  He is drinking more heavily consuming about 7 tallboy beers every day.  Says he is not using any other drugs currently.  Has not been on his psychiatric medicine only taking his seizure medicine recently.  Feels like he has had a lot of financial stress.  Living with his partner in a house that belonged to his mother.  Not working currently.  Denies psychotic symptoms Associated Signs/Symptoms: Depression Symptoms:  depressed mood, insomnia, fatigue, feelings of worthlessness/guilt, suicidal thoughts with specific plan, (Hypo) Manic Symptoms:  Impulsivity, Anxiety Symptoms:  Excessive Worry, Psychotic Symptoms:   None PTSD Symptoms: Negative Total Time spent with patient: 45 minutes  Past Psychiatric History: Patient has a history of alcohol abuse and depression variously diagnosed as generalized anxiety major depression and more recently bipolar disorder although I am not clear that that diagnosis really was supported.  Has had alcohol withdrawal seizures in the past.  Says he has been able to cut back and even stop drinking briefly.  He used to also have a problem with cocaine which  he has quit.  Is the patient at risk to self? Yes.    Has the patient been a risk to self in the past 6 months? Yes.    Has the patient been a risk to self within the distant past? Yes.    Is the patient a risk to others? No.  Has the patient been a risk to others in the past 6 months? No.  Has the patient been a risk to others within the distant past? No.   [place] Scale:  Flowsheet Row Admission (Current) from 06/09/2023 in [facility] INPATIENT BEHAVIORAL MEDICINE ED from 06/08/2023 in [facility] Emergency Department at [facility] ED from 05/21/2023 in [facility] Emergency Department at [facility]  C-SSRS RISK CATEGORY High Risk High Risk No Risk        Prior Inpatient Therapy: Yes.   If yes, describe several prior with similar situations Prior Outpatient Therapy: Yes.   If yes, describe has seen people for outpatient treatment  Alcohol Screening: 1. How often do you have a drink containing alcohol?: 4 or more times a week 2. How many drinks containing alcohol do you have on a typical day when you are drinking?: 7, 8, or 9 3. How often do you have six or more drinks on one occasion?: Weekly AUDIT-C Score: 10 4. How often during the last year have you found that you were not able to stop drinking once you had started?: Less than monthly 5. How often during the last year have you failed to do what was normally expected from you because of drinking?: Monthly 6. How often during the last year have you needed a first drink in  the morning to get yourself going after a heavy drinking session?: Less than monthly 7. How often during the last year have you had a feeling of guilt of remorse after drinking?: Monthly 8. How often during the last year have you been unable to remember what happened the night before because you had been drinking?: Less than monthly 9. Have you or someone else been injured as a result of your drinking?: Yes, but not in the last year 10. Has a relative or  friend or a doctor or another health worker been concerned about your drinking or suggested you cut down?: Yes, but not in the last year Alcohol Use Disorder Identification Test Final Score (AUDIT): 21 Alcohol Brief Interventions/Follow-up: Alcohol education/Brief advice Substance Abuse History in the last 12 months:  Yes.   Consequences of Substance Abuse: Alcohol abuse compounding and worsening mood problems seizures and behaviors and financial difficulty Previous Psychotropic Medications: Yes  Psychological Evaluations: Yes  Past Medical History:  Past Medical History:  Diagnosis Date   Alcohol abuse    Anxiety    Bipolar 2 disorder (HCC)    Depression    Seizures (HCC)     Past Surgical History:  Procedure Laterality Date   HAND SURGERY Right    Family History:  Family History  Problem Relation Age of Onset   Anxiety disorder Mother    Alcohol abuse Father    Anxiety disorder Maternal Aunt    Anxiety disorder Maternal Grandmother    Alcohol abuse Paternal Grandfather    Family Psychiatric  History: Positive for alcohol abuse and mood symptoms in both parents Tobacco Screening:  Social History   Tobacco Use  Smoking Status Every Day   Packs/day: 0.25   Years: 15.00   Additional pack years: 0.00   Total pack years: 3.75   Types: Cigarettes  Smokeless Tobacco Former   Types: Snuff  Tobacco Comments   Pt states plans to stop drinking. Will deal with tobacco cessation later.    BH Tobacco Counseling     Are you interested in Tobacco Cessation Medications?  Yes, implement Nicotene Replacement Protocol Counseled patient on smoking cessation:  Yes Reason Tobacco Screening Not Completed: No value filed.       Social History:  Social History   Substance and Sexual Activity  Alcohol Use Yes   Alcohol/week: 2.0 standard drinks of alcohol   Types: 2 Cans of beer per week   Comment: 48 beers a week     Social History   Substance and Sexual Activity  Drug Use  Yes   Types: Cocaine    Additional Social History: Marital status: Long term relationship Long term relationship, how long?: the patient stated 2 months. What types of issues is patient dealing with in the relationship?: The patient stated that the relationship is good. Does patient have children?: Yes How many children?: 1                         Allergies:  No Known Allergies Lab Results:  Results for orders placed or performed during the hospital encounter of 06/08/23 (from the past 48 hour(s))  Resp panel by RT-PCR (RSV, Flu A&B, Covid) Anterior Nasal Swab     Status: None   Collection Time: 06/08/23 10:21 PM   Specimen: Anterior Nasal Swab  Result Value Ref Range   SARS Coronavirus 2 by RT PCR NEGATIVE NEGATIVE    Comment: (NOTE) SARS-CoV-2 target nucleic acids are NOT DETECTED.  The SARS-CoV-2 RNA is generally detectable in upper respiratory specimens during the acute phase of infection. The lowest concentration of SARS-CoV-2 viral copies this assay can detect is 138 copies/mL. A negative result does not preclude SARS-Cov-2 infection and should not be used as the sole basis for treatment or other patient management decisions. A negative result may occur with  improper specimen collection/handling, submission of specimen other than nasopharyngeal swab, presence of viral mutation(s) within the areas targeted by this assay, and inadequate number of viral copies(<138 copies/mL). A negative result must be combined with clinical observations, patient history, and epidemiological information. The expected result is Negative.  Fact Sheet for Patients:  BloggerCourse.com  Fact Sheet for Healthcare Providers:  SeriousBroker.it  This test is no t yet approved or cleared by the [place] FDA and  has been authorized for detection and/or diagnosis of SARS-CoV-2 by FDA under an Emergency Use Authorization (EUA). This  EUA will remain  in effect (meaning this test can be used) for the duration of the COVID-19 declaration under Section 564(b)(1) of the Act, 21 U.S.C.section 360bbb-3(b)(1), unless the authorization is terminated  or revoked sooner.       Influenza A by PCR NEGATIVE NEGATIVE   Influenza B by PCR NEGATIVE NEGATIVE    Comment: (NOTE) The Xpert Xpress SARS-CoV-2/FLU/RSV plus assay is intended as an aid in the diagnosis of influenza from Nasopharyngeal swab specimens and should not be used as a sole basis for treatment. Nasal washings and aspirates are unacceptable for Xpert Xpress SARS-CoV-2/FLU/RSV testing.  Fact Sheet for Patients: BloggerCourse.com  Fact Sheet for Healthcare Providers: SeriousBroker.it  This test is not yet approved or cleared by the [place] FDA and has been authorized for detection and/or diagnosis of SARS-CoV-2 by FDA under an Emergency Use Authorization (EUA). This EUA will remain in effect (meaning this test can be used) for the duration of the COVID-19 declaration under Section 564(b)(1) of the Act, 21 U.S.C. section 360bbb-3(b)(1), unless the authorization is terminated or revoked.     Resp Syncytial Virus by PCR NEGATIVE NEGATIVE    Comment: (NOTE) Fact Sheet for Patients: BloggerCourse.com  Fact Sheet for Healthcare Providers: SeriousBroker.it  This test is not yet approved or cleared by the [place] FDA and has been authorized for detection and/or diagnosis of SARS-CoV-2 by FDA under an Emergency Use Authorization (EUA). This EUA will remain in effect (meaning this test can be used) for the duration of the COVID-19 declaration under Section 564(b)(1) of the Act, 21 U.S.C. section 360bbb-3(b)(1), unless the authorization is terminated or revoked.  Performed at [facility], [address], [place], [place] [address]    Rapid urine drug screen (hospital performed)     Status: None   Collection Time: 06/08/23 10:42 PM  Result Value Ref Range   Opiates NONE DETECTED NONE DETECTED   Cocaine NONE DETECTED NONE DETECTED   Benzodiazepines NONE DETECTED NONE DETECTED   Amphetamines NONE DETECTED NONE DETECTED   Tetrahydrocannabinol NONE DETECTED NONE DETECTED   Barbiturates NONE DETECTED NONE DETECTED    Comment: (NOTE) DRUG SCREEN FOR MEDICAL PURPOSES ONLY.  IF CONFIRMATION IS NEEDED FOR ANY PURPOSE, NOTIFY LAB WITHIN 5 DAYS.  LOWEST DETECTABLE LIMITS FOR URINE DRUG SCREEN Drug Class                     Cutoff (ng/mL) Amphetamine and metabolites    1000 Barbiturate and metabolites    200 Benzodiazepine  200 Opiates and metabolites        300 Cocaine and metabolites        300 THC                            50 Performed at [facility], [address] [place], [place] [address]   Comprehensive metabolic panel     Status: Abnormal   Collection Time: 06/08/23 10:49 PM  Result Value Ref Range   Sodium 137 135 - 145 mmol/L   Potassium 3.5 3.5 - 5.1 mmol/L   Chloride 103 98 - 111 mmol/L   CO2 23 22 - 32 mmol/L   Glucose, Bld 92 70 - 99 mg/dL    Comment: Glucose reference range applies only to samples taken after fasting for at least 8 hours.   BUN <5 (L) 6 - 20 mg/dL   Creatinine, Ser 6.04 0.61 - 1.24 mg/dL   Calcium 9.1 8.9 - 54.0 mg/dL   Total Protein 7.3 6.5 - 8.1 g/dL   Albumin 4.2 3.5 - 5.0 g/dL   AST 23 15 - 41 U/L   ALT 24 0 - 44 U/L   Alkaline Phosphatase 45 38 - 126 U/L   Total Bilirubin 0.8 0.3 - 1.2 mg/dL   GFR, Estimated >98 >11 mL/min    Comment: (NOTE) Calculated using the CKD-EPI Creatinine Equation (2021)    Anion gap 11 5 - 15    Comment: Performed at [facility], [address] [place], [place] [address]  Ethanol     Status: Abnormal   Collection Time: 06/08/23 10:49 PM  Result Value Ref Range   Alcohol, Ethyl (B)  148 (H) <10 mg/dL    Comment: (NOTE) Lowest detectable limit for serum alcohol is 10 mg/dL.  For medical purposes only. Performed at [facility], [address], [place], [place] [address]   Salicylate level     Status: Abnormal   Collection Time: 06/08/23 10:49 PM  Result Value Ref Range   Salicylate Lvl <7.0 (L) 7.0 - 30.0 mg/dL    Comment: Performed at [facility], [address], [place], [place] [address]  Acetaminophen level     Status: Abnormal   Collection Time: 06/08/23 10:49 PM  Result Value Ref Range   Acetaminophen (Tylenol), Serum <10 (L) 10 - 30 ug/mL    Comment: (NOTE) Therapeutic concentrations vary significantly. A range of 10-30 ug/mL  may be an effective concentration for many patients. However, some  are best treated at concentrations outside of this range. Acetaminophen concentrations >150 ug/mL at 4 hours after ingestion  and >50 ug/mL at 12 hours after ingestion are often associated with  toxic reactions.  Performed at [facility], [address], [place], [place] [address]   CBC with Differential     Status: None   Collection Time: 06/08/23 10:49 PM  Result Value Ref Range   WBC 7.1 4.0 - 10.5 K/uL   RBC 4.55 4.22 - 5.81 MIL/uL   Hemoglobin 15.2 13.0 - 17.0 g/dL   HCT 96.2 95.2 - 84.1 %   MCV 94.1 80.0 - 100.0 fL   MCH 33.4 26.0 - 34.0 pg   MCHC 35.5 30.0 - 36.0 g/dL   RDW 32.4 40.1 - 02.7 %   Platelets 289 150 - 400 K/uL   nRBC 0.0 0.0 - 0.2 %   Neutrophils Relative % 57 %   Neutro  Abs 4.0 1.7 - 7.7 K/uL   Lymphocytes Relative 31 %   Lymphs Abs 2.2 0.7 - 4.0 K/uL   Monocytes Relative 9 %   Monocytes Absolute 0.7 0.1 - 1.0 K/uL   Eosinophils Relative 2 %   Eosinophils Absolute 0.1 0.0 - 0.5 K/uL   Basophils Relative 1 %   Basophils Absolute 0.1 0.0 - 0.1 K/uL   Immature Granulocytes 0 %   Abs Immature Granulocytes 0.03 0.00 - 0.07 K/uL    Comment: Performed at [facility], [address] [place], [place] [address]    Blood Alcohol level:  Lab Results  Component Value Date   ETH 148 (H) 06/08/2023   ETH 265 (H) 04/24/2023    Metabolic Disorder Labs:  Lab Results  Component Value Date   HGBA1C 5.6 03/28/2023   MPG 114 03/28/2023   MPG 108.28 05/14/2022   No results found for: "PROLACTIN" Lab Results  Component Value Date   CHOL 199 03/28/2023   TRIG 157 (H) 03/28/2023   HDL 64 03/28/2023   CHOLHDL 3.1 03/28/2023   VLDL 31 03/28/2023   LDLCALC 104 (H) 03/28/2023   LDLCALC 120 (H) 05/14/2022    Current Medications: Current Facility-Administered Medications  Medication Dose Route Frequency Provider Last Rate Last Admin   acetaminophen (TYLENOL) tablet 650 mg  650 mg Oral Q6H PRN Ajibola, Ene A, NP       alum & mag hydroxide-simeth (MAALOX/MYLANTA) 200-200-20 MG/5ML suspension 30 mL  30 mL Oral Q4H PRN Ajibola, Ene A, NP       busPIRone (BUSPAR) tablet 15 mg  15 mg Oral TID Myrka Sylva T, MD   15 mg at 06/10/23 1235   diphenhydrAMINE (BENADRYL) capsule 50 mg  50 mg Oral TID PRN Ajibola, Ene A, NP       Or   diphenhydrAMINE (BENADRYL) injection 50 mg  50 mg Intramuscular TID PRN Ajibola, Ene A, NP       FLUoxetine (PROZAC) capsule 40 mg  40 mg Oral Daily Chrishelle Zito T, MD   40 mg at 06/10/23 0844   folic acid (FOLVITE) tablet 1 mg  1 mg Oral Daily Matilde Pottenger T, MD   1 mg at 06/10/23 0844   gabapentin (NEURONTIN) capsule 600 mg  600 mg Oral TID Wylene Weissman T, MD   600 mg at 06/10/23 1235   haloperidol (HALDOL) tablet 5 mg  5 mg Oral TID PRN Ajibola, Ene A, NP       Or   haloperidol lactate (HALDOL) injection 5 mg  5 mg Intramuscular TID PRN Ajibola, Ene A, NP       levETIRAcetam (KEPPRA) tablet 500 mg  500 mg Oral BID Canyon Lohr T, MD   500 mg at 06/10/23 0844   LORazepam (ATIVAN) tablet 1-4 mg  1-4 mg Oral Q1H PRN Adelena Desantiago T, MD   1 mg at 06/10/23 4782   Or   LORazepam (ATIVAN) injection 1-4 mg  1-4 mg Intravenous  Q1H PRN Amala Petion T, MD       magnesium hydroxide (MILK OF MAGNESIA) suspension 30 mL  30 mL Oral Daily PRN Ajibola, Ene A, NP       multivitamin with minerals tablet 1 tablet  1 tablet Oral Daily Sammy Douthitt, Jackquline Denmark, MD   1 tablet at 06/10/23 0844   nicotine (NICODERM CQ - dosed in mg/24 hours) patch 21 mg  21 mg Transdermal Daily Angelynn Lemus, Jackquline Denmark, MD   21 mg at  06/10/23 0845   pantoprazole (PROTONIX) EC tablet 40 mg  40 mg Oral Daily Jayline Kilburg, Jackquline Denmark, MD   40 mg at 06/10/23 0844   thiamine (VITAMIN B1) tablet 100 mg  100 mg Oral Daily Francesa Eugenio T, MD   100 mg at 06/10/23 7846   Or   thiamine (VITAMIN B1) injection 100 mg  100 mg Intravenous Daily Myiah Petkus, Jackquline Denmark, MD       traZODone (DESYREL) tablet 50 mg  50 mg Oral QHS PRN Ajibola, Ene A, NP   50 mg at 06/09/23 2218   PTA Medications: Medications Prior to Admission  Medication Sig Dispense Refill Last Dose   busPIRone (BUSPAR) 15 MG tablet Take 1 tablet (15 mg total) by mouth 3 (three) times daily. 90 tablet 3    famotidine (PEPCID) 20 MG tablet Take 1 tablet (20 mg total) by mouth 2 (two) times daily. 30 tablet 0    FLUoxetine (PROZAC) 40 MG capsule Take 1 capsule (40 mg total) by mouth daily. 30 capsule 3    gabapentin (NEURONTIN) 300 MG capsule Take 2 capsules (600 mg total) by mouth 3 (three) times daily. 180 capsule 3    haloperidol (HALDOL) 2 MG tablet Take 1 tablet (2 mg total) by mouth daily. 30 tablet 3    hydrOXYzine (ATARAX) 25 MG tablet Take 1 tablet (25 mg total) by mouth 3 (three) times daily as needed for anxiety (Sleep). 90 tablet 3    levETIRAcetam (KEPPRA) 500 MG tablet Take 1 tablet (500 mg total) by mouth 2 (two) times daily. 60 tablet 0    ondansetron (ZOFRAN-ODT) 4 MG disintegrating tablet Take 1 tablet (4 mg total) by mouth every 8 (eight) hours as needed for nausea or vomiting. 10 tablet 0    pantoprazole (PROTONIX) 40 MG tablet Take 1 tablet (40 mg total) by mouth daily. 30 tablet 0    traZODone (DESYREL) 150 MG  tablet Take 1 tablet (150 mg total) by mouth at bedtime. 30 tablet 3     Musculoskeletal: Strength & Muscle Tone: within normal limits Gait & Station: normal Patient leans: N/A            Psychiatric Specialty Exam:  Presentation  General Appearance:  Appropriate for Environment  Eye Contact: Fair  Speech: Clear and Coherent  Speech Volume: Normal  Handedness: Right   Mood and Affect  Mood: Dysphoric  Affect: Congruent   Thought Process  Thought Processes: Coherent; Goal Directed  Duration of Psychotic Symptoms:N/A Past Diagnosis of Schizophrenia or Psychoactive disorder: No  Descriptions of Associations:Intact  Orientation:Full (Time, Place and Person)  Thought Content:Logical  Hallucinations:No data recorded Ideas of Reference:None  Suicidal Thoughts:No data recorded Homicidal Thoughts:No data recorded  Sensorium  Memory: Immediate Fair; Recent Fair; Remote Fair  Judgment: Fair  Insight: Fair   Art therapist  Concentration: Fair  Attention Span: Fair  Recall: [employer] of Knowledge: Fair  Language: Fair   Psychomotor Activity  Psychomotor Activity:No data recorded  Assets  Assets: Communication Skills; Desire for Improvement; Financial Resources/Insurance; Intimacy; Leisure Time; Social Support   Sleep  Sleep:No data recorded   Physical Exam: Physical Exam Vitals reviewed.  Constitutional:      Appearance: Normal appearance.  HENT:     Head: Normocephalic and atraumatic.     Mouth/Throat:     Pharynx: Oropharynx is clear.  Eyes:     Pupils: Pupils are equal, round, and reactive to light.  Cardiovascular:     Rate and Rhythm:  Normal rate and regular rhythm.  Pulmonary:     Effort: Pulmonary effort is normal.     Breath sounds: Normal breath sounds.  Abdominal:     General: Abdomen is flat.     Palpations: Abdomen is soft.  Musculoskeletal:        General: Normal range of motion.   Skin:    General: Skin is warm and dry.  Neurological:     General: No focal deficit present.     Mental Status: He is alert. Mental status is at baseline.  Psychiatric:        Attention and Perception: Attention normal.        Mood and Affect: Mood is depressed. Affect is blunt.        Speech: Speech is delayed.        Behavior: Behavior is slowed.        Thought Content: Thought content normal. Thought content does not include suicidal ideation.        Cognition and Memory: Cognition normal.    Review of Systems  Constitutional: Negative.   HENT: Negative.    Eyes: Negative.   Respiratory: Negative.    Cardiovascular: Negative.   Gastrointestinal: Negative.   Musculoskeletal: Negative.   Skin: Negative.   Neurological: Negative.   Psychiatric/Behavioral:  Positive for depression, hallucinations and substance abuse. The patient is nervous/anxious.    Blood pressure 105/74, pulse 67, temperature 97.9 F (36.6 C), temperature source Oral, resp. rate 20, height 6' (1.829 m), weight 99.8 kg, SpO2 100 %. Body mass index is 29.84 kg/m.  Treatment Plan Summary: Medication management and Plan reviewed medication.  Restarted medicine for both depression and alcohol withdrawal.  Reviewed labs.  Patient appears to be stable without active alcohol withdrawal at the moment.  Encourage group participation and activity.  Observation Level/Precautions:  15 minute checks  Laboratory:  Chemistry Profile  Psychotherapy:    Medications:    Consultations:    Discharge Concerns:    Estimated LOS:  Other:     Physician Treatment Plan for Primary Diagnosis: Severe recurrent major depression without psychotic features (HCC) Long Term Goal(s): Improvement in symptoms so as ready for discharge  Short Term Goals: Ability to verbalize feelings will improve, Ability to disclose and discuss suicidal ideas, and Ability to demonstrate self-control will improve  Physician Treatment Plan for Secondary  Diagnosis: Principal Problem:   Severe recurrent major depression without psychotic features (HCC) Active Problems:   Alcohol dependence (HCC)   History of seizures  Long Term Goal(s): Improvement in symptoms so as ready for discharge  Short Term Goals: Ability to maintain clinical measurements within normal limits will improve  I certify that inpatient services furnished can reasonably be expected to improve the patient's condition.    Mordecai Rasmussen, MD 6/17/20241:56 PM

## 2023-06-10 NOTE — Plan of Care (Signed)
D- Patient alert and oriented. Patient presented in an anxious, but pleasant mood on assessment reporting that he slept "poor" last night due to "getting used to a new environment". Patient did endorse back and arm pain on his self-inventory, but did not request anything PRN from this Clinical research associate. Patient endorsed hopelessness, depression, and anxiety on his self-inventory, stating "I'm just stressed out about life". Patient denied SI, HI, AVH, stating "so far, no, they come and go. So, that's a good thing". Patient's goal for today is to "stay positive", in which he will "sleep", in order to achieve his goal.  A- Scheduled medications administered to patient, per MD orders. Support and encouragement provided.  Routine safety checks conducted every 15 minutes.  Patient informed to notify staff with problems or concerns.  R- No adverse drug reactions noted. Patient contracts for safety at this time. Patient compliant with medications and treatment plan. Patient receptive, calm, and cooperative. Patient interacts well with others on the unit. Patient remains safe at this time.  Problem: Education: Goal: Knowledge of General Education information will improve Description: Including pain rating scale, medication(s)/side effects and non-pharmacologic comfort measures Outcome: Not Progressing   Problem: Health Behavior/Discharge Planning: Goal: Ability to manage health-related needs will improve Outcome: Not Progressing   Problem: Clinical Measurements: Goal: Ability to maintain clinical measurements within normal limits will improve Outcome: Not Progressing Goal: Will remain free from infection Outcome: Not Progressing Goal: Diagnostic test results will improve Outcome: Not Progressing Goal: Respiratory complications will improve Outcome: Not Progressing Goal: Cardiovascular complication will be avoided Outcome: Not Progressing   Problem: Activity: Goal: Risk for activity intolerance will  decrease Outcome: Not Progressing   Problem: Nutrition: Goal: Adequate nutrition will be maintained Outcome: Not Progressing   Problem: Coping: Goal: Level of anxiety will decrease Outcome: Not Progressing   Problem: Elimination: Goal: Will not experience complications related to bowel motility Outcome: Not Progressing Goal: Will not experience complications related to urinary retention Outcome: Not Progressing   Problem: Pain Managment: Goal: General experience of comfort will improve Outcome: Not Progressing   Problem: Safety: Goal: Ability to remain free from injury will improve Outcome: Not Progressing   Problem: Skin Integrity: Goal: Risk for impaired skin integrity will decrease Outcome: Not Progressing   Problem: Education: Goal: Knowledge of [place] [facility] Education information/materials will improve Outcome: Not Progressing Goal: Emotional status will improve Outcome: Not Progressing Goal: Mental status will improve Outcome: Not Progressing Goal: Verbalization of understanding the information provided will improve Outcome: Not Progressing   Problem: Activity: Goal: Interest or engagement in activities will improve Outcome: Not Progressing Goal: Sleeping patterns will improve Outcome: Not Progressing   Problem: Coping: Goal: Ability to verbalize frustrations and anger appropriately will improve Outcome: Not Progressing Goal: Ability to demonstrate self-control will improve Outcome: Not Progressing   Problem: Health Behavior/Discharge Planning: Goal: Identification of resources available to assist in meeting health care needs will improve Outcome: Not Progressing Goal: Compliance with treatment plan for underlying cause of condition will improve Outcome: Not Progressing   Problem: Physical Regulation: Goal: Ability to maintain clinical measurements within normal limits will improve Outcome: Not Progressing   Problem: Safety: Goal: Periods of  time without injury will increase Outcome: Not Progressing

## 2023-06-10 NOTE — BHH Suicide Risk Assessment (Signed)
[facility] Admission Suicide Risk Assessment   Nursing information obtained from:  Patient Demographic factors:  Male Current Mental Status:  Suicide plan Loss Factors:  NA Historical Factors:  Prior suicide attempts Risk Reduction Factors:  Positive social support  Total Time spent with patient: 45 minutes Principal Problem: Severe recurrent major depression without psychotic features (HCC) Diagnosis:  Principal Problem:   Severe recurrent major depression without psychotic features (HCC) Active Problems:   Alcohol dependence (HCC)   History of seizures  Subjective Data: Patient seen and chart reviewed.  32 year old man with a history of depression and substance abuse brought himself voluntarily to the hospital in [place] complaining of depressed mood and suicidal thoughts ongoing alcohol use.  Patient was intoxicated at the time.  [place] Spaniel he was having thoughts of walking into traffic.  Currently the patient admits all of that says at the moment he does not have any more thoughts of suicide.  Feels more optimistic and feels like he is looking forward to things more.  Continued Clinical Symptoms:  Alcohol Use Disorder Identification Test Final Score (AUDIT): 21 The "Alcohol Use Disorders Identification Test", Guidelines for Use in Primary Care, Second Edition.  World Science writer [facility]). Score between 0-7:  no or low risk or alcohol related problems. Score between 8-15:  moderate risk of alcohol related problems. Score between 16-19:  high risk of alcohol related problems. Score 20 or above:  warrants further diagnostic evaluation for alcohol dependence and treatment.   CLINICAL FACTORS:   Depression:   Comorbid alcohol abuse/dependence Alcohol/Substance Abuse/Dependencies   Musculoskeletal: Strength & Muscle Tone: within normal limits Gait & Station: normal Patient leans: N/A  Psychiatric Specialty Exam:  Presentation  General Appearance:  Appropriate for  Environment  Eye Contact: Fair  Speech: Clear and Coherent  Speech Volume: Normal  Handedness: Right   Mood and Affect  Mood: Dysphoric  Affect: Congruent   Thought Process  Thought Processes: Coherent; Goal Directed  Descriptions of Associations:Intact  Orientation:Full (Time, Place and Person)  Thought Content:Logical  History of Schizophrenia/Schizoaffective disorder:No  Duration of Psychotic Symptoms:N/A  Hallucinations:No data recorded Ideas of Reference:None  Suicidal Thoughts:No data recorded Homicidal Thoughts:No data recorded  Sensorium  Memory: Immediate Fair; Recent Fair; Remote Fair  Judgment: Fair  Insight: Fair   Art therapist  Concentration: Fair  Attention Span: Fair  Recall: [employer] of Knowledge: Fair  Language: Fair   Psychomotor Activity  Psychomotor Activity:No data recorded  Assets  Assets: Communication Skills; Desire for Improvement; Financial Resources/Insurance; Intimacy; Leisure Time; Social Support   Sleep  Sleep:No data recorded   Physical Exam: Physical Exam Vitals reviewed.  Constitutional:      Appearance: Normal appearance.  HENT:     Head: Normocephalic and atraumatic.     Mouth/Throat:     Pharynx: Oropharynx is clear.  Eyes:     Pupils: Pupils are equal, round, and reactive to light.  Cardiovascular:     Rate and Rhythm: Normal rate and regular rhythm.  Pulmonary:     Effort: Pulmonary effort is normal.     Breath sounds: Normal breath sounds.  Abdominal:     General: Abdomen is flat.     Palpations: Abdomen is soft.  Musculoskeletal:        General: Normal range of motion.  Skin:    General: Skin is warm and dry.  Neurological:     General: No focal deficit present.     Mental Status: He is alert. Mental status is at  baseline.  Psychiatric:        Attention and Perception: Attention normal.        Mood and Affect: Mood is depressed. Affect is blunt.         Speech: Speech normal.        Behavior: Behavior is cooperative.        Thought Content: Thought content normal. Thought content does not include suicidal ideation.        Cognition and Memory: Cognition normal.    Review of Systems  Constitutional: Negative.   HENT: Negative.    Eyes: Negative.   Respiratory: Negative.    Cardiovascular: Negative.   Gastrointestinal: Negative.   Musculoskeletal: Negative.   Skin: Negative.   Neurological: Negative.   Psychiatric/Behavioral:  Positive for depression and substance abuse. The patient is nervous/anxious.    Blood pressure 105/74, pulse 67, temperature 97.9 F (36.6 C), temperature source Oral, resp. rate 20, height 6' (1.829 m), weight 99.8 kg, SpO2 100 %. Body mass index is 29.84 kg/m.   COGNITIVE FEATURES THAT CONTRIBUTE TO RISK:  None    SUICIDE RISK:   Mild:  Suicidal ideation of limited frequency, intensity, duration, and specificity.  There are no identifiable plans, no associated intent, mild dysphoria and related symptoms, good self-control (both objective and subjective assessment), few other risk factors, and identifiable protective factors, including available and accessible social support.  PLAN OF CARE: Continue 15-minute checks.  Engage in individual and group therapy.  Restart medication.  Full treatment team assessment.  Ongoing assessment of dangerousness prior to discharge  I certify that inpatient services furnished can reasonably be expected to improve the patient's condition.   Mordecai Rasmussen, MD 06/10/2023, 1:54 PM

## 2023-06-10 NOTE — BH IP Treatment Plan (Signed)
Interdisciplinary Treatment and Diagnostic Plan Update  06/10/2023 Time of Session: 08:53 John Vaughan MRN: [id number]  Principal Diagnosis: Severe recurrent major depression without psychotic features [facility])  Secondary Diagnoses: Principal Problem:   Severe recurrent major depression without psychotic features (HCC) Active Problems:   Alcohol dependence (HCC)   History of seizures   Current Medications:  Current Facility-Administered Medications  Medication Dose Route Frequency Provider Last Rate Last Admin   acetaminophen (TYLENOL) tablet 650 mg  650 mg Oral Q6H PRN Ajibola, Ene A, NP       alum & mag hydroxide-simeth (MAALOX/MYLANTA) 200-200-20 MG/5ML suspension 30 mL  30 mL Oral Q4H PRN Ajibola, Ene A, NP       busPIRone (BUSPAR) tablet 15 mg  15 mg Oral TID Clapacs, John T, MD   15 mg at 06/10/23 1235   diphenhydrAMINE (BENADRYL) capsule 50 mg  50 mg Oral TID PRN Ajibola, Ene A, NP       Or   diphenhydrAMINE (BENADRYL) injection 50 mg  50 mg Intramuscular TID PRN Ajibola, Ene A, NP       FLUoxetine (PROZAC) capsule 40 mg  40 mg Oral Daily Clapacs, John T, MD   40 mg at 06/10/23 0844   folic acid (FOLVITE) tablet 1 mg  1 mg Oral Daily Clapacs, John T, MD   1 mg at 06/10/23 0844   gabapentin (NEURONTIN) capsule 600 mg  600 mg Oral TID Clapacs, John T, MD   600 mg at 06/10/23 1235   haloperidol (HALDOL) tablet 5 mg  5 mg Oral TID PRN Ajibola, Ene A, NP       Or   haloperidol lactate (HALDOL) injection 5 mg  5 mg Intramuscular TID PRN Ajibola, Ene A, NP       levETIRAcetam (KEPPRA) tablet 500 mg  500 mg Oral BID Clapacs, John T, MD   500 mg at 06/10/23 0844   LORazepam (ATIVAN) tablet 1-4 mg  1-4 mg Oral Q1H PRN Clapacs, John T, MD   1 mg at 06/10/23 0849   Or   LORazepam (ATIVAN) injection 1-4 mg  1-4 mg Intravenous Q1H PRN Clapacs, John T, MD       magnesium hydroxide (MILK OF MAGNESIA) suspension 30 mL  30 mL Oral Daily PRN Ajibola, Ene A, NP       multivitamin with  minerals tablet 1 tablet  1 tablet Oral Daily Clapacs, Jackquline Denmark, MD   1 tablet at 06/10/23 0844   nicotine (NICODERM CQ - dosed in mg/24 hours) patch 21 mg  21 mg Transdermal Daily Clapacs, Jackquline Denmark, MD   21 mg at 06/10/23 0845   pantoprazole (PROTONIX) EC tablet 40 mg  40 mg Oral Daily Clapacs, Jackquline Denmark, MD   40 mg at 06/10/23 0844   thiamine (VITAMIN B1) tablet 100 mg  100 mg Oral Daily Clapacs, John T, MD   100 mg at 06/10/23 7829   Or   thiamine (VITAMIN B1) injection 100 mg  100 mg Intravenous Daily Clapacs, Jackquline Denmark, MD       traZODone (DESYREL) tablet 50 mg  50 mg Oral QHS PRN Ajibola, Ene A, NP   50 mg at 06/09/23 2218   PTA Medications: Medications Prior to Admission  Medication Sig Dispense Refill Last Dose   busPIRone (BUSPAR) 15 MG tablet Take 1 tablet (15 mg total) by mouth 3 (three) times daily. 90 tablet 3    famotidine (PEPCID) 20 MG tablet Take 1 tablet (20 mg total)  by mouth 2 (two) times daily. 30 tablet 0    FLUoxetine (PROZAC) 40 MG capsule Take 1 capsule (40 mg total) by mouth daily. 30 capsule 3    gabapentin (NEURONTIN) 300 MG capsule Take 2 capsules (600 mg total) by mouth 3 (three) times daily. 180 capsule 3    haloperidol (HALDOL) 2 MG tablet Take 1 tablet (2 mg total) by mouth daily. 30 tablet 3    hydrOXYzine (ATARAX) 25 MG tablet Take 1 tablet (25 mg total) by mouth 3 (three) times daily as needed for anxiety (Sleep). 90 tablet 3    levETIRAcetam (KEPPRA) 500 MG tablet Take 1 tablet (500 mg total) by mouth 2 (two) times daily. 60 tablet 0    ondansetron (ZOFRAN-ODT) 4 MG disintegrating tablet Take 1 tablet (4 mg total) by mouth every 8 (eight) hours as needed for nausea or vomiting. 10 tablet 0    pantoprazole (PROTONIX) 40 MG tablet Take 1 tablet (40 mg total) by mouth daily. 30 tablet 0    traZODone (DESYREL) 150 MG tablet Take 1 tablet (150 mg total) by mouth at bedtime. 30 tablet 3     Patient Stressors: Financial difficulties   Marital or family conflict    Substance abuse    Patient Strengths: Ability for insight  Active sense of humor  Capable of independent living   Treatment Modalities: Medication Management, Group therapy, Case management,  1 to 1 session with clinician, Psychoeducation, Recreational therapy.   Physician Treatment Plan for Primary Diagnosis: Severe recurrent major depression without psychotic features (HCC) Long Term Goal(s): Improvement in symptoms so as ready for discharge   Short Term Goals: Ability to maintain clinical measurements within normal limits will improve Ability to verbalize feelings will improve Ability to disclose and discuss suicidal ideas Ability to demonstrate self-control will improve  Medication Management: Evaluate patient's response, side effects, and tolerance of medication regimen.  Therapeutic Interventions: 1 to 1 sessions, Unit Group sessions and Medication administration.  Evaluation of Outcomes: Not Met  Physician Treatment Plan for Secondary Diagnosis: Principal Problem:   Severe recurrent major depression without psychotic features (HCC) Active Problems:   Alcohol dependence (HCC)   History of seizures  Long Term Goal(s): Improvement in symptoms so as ready for discharge   Short Term Goals: Ability to maintain clinical measurements within normal limits will improve Ability to verbalize feelings will improve Ability to disclose and discuss suicidal ideas Ability to demonstrate self-control will improve     Medication Management: Evaluate patient's response, side effects, and tolerance of medication regimen.  Therapeutic Interventions: 1 to 1 sessions, Unit Group sessions and Medication administration.  Evaluation of Outcomes: Not Met   RN Treatment Plan for Primary Diagnosis: Severe recurrent major depression without psychotic features (HCC) Long Term Goal(s): Knowledge of disease and therapeutic regimen to maintain health will improve  Short Term Goals: Ability to  remain free from injury will improve, Ability to verbalize frustration and anger appropriately will improve, Ability to demonstrate self-control, Ability to participate in decision making will improve, Ability to verbalize feelings will improve, Ability to disclose and discuss suicidal ideas, Ability to identify and develop effective coping behaviors will improve, and Compliance with prescribed medications will improve  Medication Management: RN will administer medications as ordered by provider, will assess and evaluate patient's response and provide education to patient for prescribed medication. RN will report any adverse and/or side effects to prescribing provider.  Therapeutic Interventions: 1 on 1 counseling sessions, Psychoeducation, Medication administration, Evaluate responses to  treatment, Monitor vital signs and CBGs as ordered, Perform/monitor CIWA, COWS, AIMS and Fall Risk screenings as ordered, Perform wound care treatments as ordered.  Evaluation of Outcomes: Not Met   LCSW Treatment Plan for Primary Diagnosis: Severe recurrent major depression without psychotic features (HCC) Long Term Goal(s): Safe transition to appropriate next level of care at discharge, Engage patient in therapeutic group addressing interpersonal concerns.  Short Term Goals: Engage patient in aftercare planning with referrals and resources, Increase social support, Increase ability to appropriately verbalize feelings, Increase emotional regulation, Facilitate acceptance of mental health diagnosis and concerns, Facilitate patient progression through stages of change regarding substance use diagnoses and concerns, Identify triggers associated with mental health/substance abuse issues, and Increase skills for wellness and recovery  Therapeutic Interventions: Assess for all discharge needs, 1 to 1 time with Social worker, Explore available resources and support systems, Assess for adequacy in community support network,  Educate family and significant other(s) on suicide prevention, Complete Psychosocial Assessment, Interpersonal group therapy.  Evaluation of Outcomes: Not Met   Progress in Treatment: Attending groups: Yes. Participating in groups: Yes. Taking medication as prescribed: Yes. Toleration medication: Yes. Family/Significant other contact made: No, will contact:  when given permission. Patient understands diagnosis: Yes. Discussing patient identified problems/goals with staff: Yes. Medical problems stabilized or resolved: Yes. Denies suicidal/homicidal ideation: Yes. Issues/concerns per patient self-inventory: No. Other: none.  New problem(s) identified: No, Describe:  none identified.  New Short Term/Long Term Goal(s): detox, medication management for mood stabilization; elimination of SI thoughts; development of comprehensive mental wellness/sobriety plan.   Patient Goals:  "Get sober and get my medications correct."  Discharge Plan or Barriers: CSW will assist pt with development of an appropriate aftercare/discharge plan.   Reason for Continuation of Hospitalization: Medication stabilization Suicidal ideation Withdrawal symptoms  Estimated Length of Stay: 1-7 days   Last 3 [place] Suicide Severity Risk Score: Flowsheet Row Admission (Current) from 06/09/2023 in [facility]PATIENT BEH[facility]ICINE ED from 06/08/2023 in [facility] Emergency Department at [facility] ED from 05/21/2023 in [facility] Emergency Department at [facility]  C-SSRS RISK CATEGORY High Risk High Risk No Risk       Last [facility] 2/9 Scores:    04/15/2023   11:15 AM 06/11/2022    9:33 PM 05/29/2022    2:01 PM  Depression screen PHQ 2/9  Decreased Interest 3 2 1   Down, Depressed, Hopeless 0 3 2  PHQ - 2 Score 3 5 3   Altered sleeping 2 3 1   Tired, decreased energy 2 2 1   Change in appetite 3 2 1   Feeling bad or failure about yourself  2 2 2   Trouble concentrating 1 1 2   Moving slowly or  fidgety/restless 1 1 0  Suicidal thoughts 0 1 1  PHQ-9 Score 14 17 11   Difficult doing work/chores Somewhat difficult Very difficult Somewhat difficult    Scribe for Treatment Team: Glenis Smoker, LCSW 06/10/2023 4:27 PM

## 2023-06-11 ENCOUNTER — Other Ambulatory Visit: Payer: Self-pay

## 2023-06-11 DIAGNOSIS — F332 Major depressive disorder, recurrent severe without psychotic features: Secondary | ICD-10-CM | POA: Diagnosis not present

## 2023-06-11 MED ORDER — FAMOTIDINE 20 MG PO TABS: 20.0000 mg | ORAL_TABLET | Freq: Two times a day (BID) | ORAL | 1 refills | Status: DC

## 2023-06-11 MED ORDER — BUSPIRONE HCL 15 MG PO TABS
15.0000 mg | ORAL_TABLET | Freq: Three times a day (TID) | ORAL | 1 refills | Status: DC
Start: 2023-06-11 — End: 2023-06-11

## 2023-06-11 MED ORDER — TRAZODONE HCL 150 MG PO TABS
150.0000 mg | ORAL_TABLET | Freq: Every evening | ORAL | 0 refills | Status: DC | PRN
  Filled 2023-06-11: qty 15, 15d supply, fill #0

## 2023-06-11 MED ORDER — TRAZODONE HCL 150 MG PO TABS: 150.0000 mg | ORAL_TABLET | Freq: Every evening | ORAL | 1 refills | Status: DC | PRN

## 2023-06-11 MED ORDER — FAMOTIDINE 20 MG PO TABS
20.0000 mg | ORAL_TABLET | Freq: Two times a day (BID) | ORAL | 0 refills | Status: DC
  Filled 2023-06-11: qty 30, 15d supply, fill #0

## 2023-06-11 MED ORDER — PANTOPRAZOLE SODIUM 40 MG PO TBEC: 40.0000 mg | DELAYED_RELEASE_TABLET | Freq: Every day | ORAL | 1 refills | Status: DC

## 2023-06-11 MED ORDER — GABAPENTIN 300 MG PO CAPS
600.0000 mg | ORAL_CAPSULE | Freq: Three times a day (TID) | ORAL | 1 refills | Status: DC
Start: 2023-06-11 — End: 2023-06-11

## 2023-06-11 MED ORDER — LEVETIRACETAM 500 MG PO TABS: 500.0000 mg | ORAL_TABLET | Freq: Two times a day (BID) | ORAL | 1 refills | Status: DC

## 2023-06-11 MED ORDER — GABAPENTIN 300 MG PO CAPS
600.0000 mg | ORAL_CAPSULE | Freq: Three times a day (TID) | ORAL | 0 refills | Status: DC
Start: 2023-06-11 — End: 2023-07-26
  Filled 2023-06-11: qty 90, 15d supply, fill #0

## 2023-06-11 MED ORDER — NICOTINE 21 MG/24HR TD PT24: 21.0000 mg | MEDICATED_PATCH | Freq: Every day | TRANSDERMAL | 1 refills | Status: DC

## 2023-06-11 MED ORDER — LEVETIRACETAM 500 MG PO TABS
500.0000 mg | ORAL_TABLET | Freq: Two times a day (BID) | ORAL | 0 refills | Status: DC
  Filled 2023-06-11: qty 30, 15d supply, fill #0

## 2023-06-11 MED ORDER — PANTOPRAZOLE SODIUM 40 MG PO TBEC
40.0000 mg | DELAYED_RELEASE_TABLET | Freq: Every day | ORAL | 0 refills | Status: DC
  Filled 2023-06-11: qty 15, 15d supply, fill #0

## 2023-06-11 MED ORDER — BUSPIRONE HCL 15 MG PO TABS
15.0000 mg | ORAL_TABLET | Freq: Three times a day (TID) | ORAL | 0 refills | Status: DC
Start: 2023-06-11 — End: 2023-07-26
  Filled 2023-06-11: qty 45, 15d supply, fill #0

## 2023-06-11 MED ORDER — FLUOXETINE HCL 40 MG PO CAPS
40.0000 mg | ORAL_CAPSULE | Freq: Every day | ORAL | 0 refills | Status: DC
Start: 2023-06-11 — End: 2023-07-26
  Filled 2023-06-11: qty 15, 15d supply, fill #0

## 2023-06-11 MED ORDER — FLUOXETINE HCL 40 MG PO CAPS
40.0000 mg | ORAL_CAPSULE | Freq: Every day | ORAL | 1 refills | Status: DC
Start: 2023-06-11 — End: 2023-06-11

## 2023-06-11 MED ORDER — NICOTINE 21 MG/24HR TD PT24
21.0000 mg | MEDICATED_PATCH | Freq: Every day | TRANSDERMAL | 0 refills | Status: DC
  Filled 2023-06-11: qty 14, 14d supply, fill #0

## 2023-06-11 NOTE — Progress Notes (Signed)
[facility] MD Progress Note  06/11/2023 10:52 AM John Vaughan  MRN:  [id number] Subjective: Follow-up 32 year old man with depression and alcohol abuse.  Patient today says he is feeling much better.  No symptoms currently of alcohol withdrawal.  No tremor vital signs stable.  Mood improved.  Denies any suicidal thought. Principal Problem: Severe recurrent major depression without psychotic features (HCC) Diagnosis: Principal Problem:   Severe recurrent major depression without psychotic features (HCC) Active Problems:   Alcohol dependence (HCC)   History of seizures  Total Time spent with patient: 30 minutes  Past Psychiatric History: Past history of depression and alcohol abuse and anxiety  Past Medical History:  Past Medical History:  Diagnosis Date   Alcohol abuse    Anxiety    Bipolar 2 disorder (HCC)    Depression    Seizures (HCC)     Past Surgical History:  Procedure Laterality Date   HAND SURGERY Right    Family History:  Family History  Problem Relation Age of Onset   Anxiety disorder Mother    Alcohol abuse Father    Anxiety disorder Maternal Aunt    Anxiety disorder Maternal Grandmother    Alcohol abuse Paternal Grandfather    Family Psychiatric  History: See previous Social History:  Social History   Substance and Sexual Activity  Alcohol Use Yes   Alcohol/week: 2.0 standard drinks of alcohol   Types: 2 Cans of beer per week   Comment: 48 beers a week     Social History   Substance and Sexual Activity  Drug Use Yes   Types: Cocaine    Social History   Socioeconomic History   Marital status: Single    Spouse name: Not on file   Number of children: Not on file   Years of education: Not on file   Highest education level: Not on file  Occupational History   Not on file  Tobacco Use   Smoking status: Every Day    Packs/day: 0.25    Years: 15.00    Additional pack years: 0.00    Total pack years: 3.75    Types: Cigarettes   Smokeless  tobacco: Former    Types: Snuff   Tobacco comments:    Pt states plans to stop drinking. Will deal with tobacco cessation later.  Vaping Use   Vaping Use: Never used  Substance and Sexual Activity   Alcohol use: Yes    Alcohol/week: 2.0 standard drinks of alcohol    Types: 2 Cans of beer per week    Comment: 48 beers a week   Drug use: Yes    Types: Cocaine   Sexual activity: Not Currently  Other Topics Concern   Not on file  Social History Narrative   Not on file   Social Determinants of Health   Financial Resource Strain: Not on file  Food Insecurity: Food Insecurity Present (06/09/2023)   Hunger Vital Sign    Worried About Running Out of Food in the Last Year: Sometimes true    Ran Out of Food in the Last Year: Sometimes true  Transportation Needs: Unmet Transportation Needs (06/09/2023)   PRAPARE - Administrator, Civil Service (Medical): Yes    Lack of Transportation (Non-Medical): Yes  Physical Activity: Not on file  Stress: Not on file  Social Connections: Not on file   Additional Social History:  Sleep: Fair  Appetite:  Fair  Current Medications: Current Facility-Administered Medications  Medication Dose Route Frequency Provider Last Rate Last Admin   acetaminophen (TYLENOL) tablet 650 mg  650 mg Oral Q6H PRN Ajibola, Ene A, NP       alum & mag hydroxide-simeth (MAALOX/MYLANTA) 200-200-20 MG/5ML suspension 30 mL  30 mL Oral Q4H PRN Ajibola, Ene A, NP       busPIRone (BUSPAR) tablet 15 mg  15 mg Oral TID Libbey Duce T, MD   15 mg at 06/11/23 0831   diphenhydrAMINE (BENADRYL) capsule 50 mg  50 mg Oral TID PRN Ajibola, Ene A, NP       Or   diphenhydrAMINE (BENADRYL) injection 50 mg  50 mg Intramuscular TID PRN Ajibola, Ene A, NP       FLUoxetine (PROZAC) capsule 40 mg  40 mg Oral Daily Arsema Tusing T, MD   40 mg at 06/11/23 0831   folic acid (FOLVITE) tablet 1 mg  1 mg Oral Daily Lejon Afzal T, MD   1 mg at  06/11/23 0831   gabapentin (NEURONTIN) capsule 600 mg  600 mg Oral TID Lyndon Chenoweth T, MD   600 mg at 06/11/23 0831   haloperidol (HALDOL) tablet 5 mg  5 mg Oral TID PRN Ajibola, Ene A, NP       Or   haloperidol lactate (HALDOL) injection 5 mg  5 mg Intramuscular TID PRN Ajibola, Ene A, NP       levETIRAcetam (KEPPRA) tablet 500 mg  500 mg Oral BID Elese Rane T, MD   500 mg at 06/11/23 2956   LORazepam (ATIVAN) tablet 1-4 mg  1-4 mg Oral Q1H PRN Charnice Zwilling T, MD   1 mg at 06/11/23 2130   Or   LORazepam (ATIVAN) injection 1-4 mg  1-4 mg Intravenous Q1H PRN Javanna Patin T, MD       magnesium hydroxide (MILK OF MAGNESIA) suspension 30 mL  30 mL Oral Daily PRN Ajibola, Ene A, NP       multivitamin with minerals tablet 1 tablet  1 tablet Oral Daily Terran Klinke, Jackquline Denmark, MD   1 tablet at 06/11/23 0831   nicotine (NICODERM CQ - dosed in mg/24 hours) patch 21 mg  21 mg Transdermal Daily Lamona Eimer, Jackquline Denmark, MD   21 mg at 06/11/23 8657   pantoprazole (PROTONIX) EC tablet 40 mg  40 mg Oral Daily Saydi Kobel, Jackquline Denmark, MD   40 mg at 06/11/23 0831   thiamine (VITAMIN B1) tablet 100 mg  100 mg Oral Daily Gaege Sangalang T, MD   100 mg at 06/11/23 8469   Or   thiamine (VITAMIN B1) injection 100 mg  100 mg Intravenous Daily Fischer Halley, Jackquline Denmark, MD       traZODone (DESYREL) tablet 150 mg  150 mg Oral QHS PRN Syra Sirmons, Jackquline Denmark, MD   150 mg at 06/10/23 2226    Lab Results: No results found for this or any previous visit (from the past 48 hour(s)).  Blood Alcohol level:  Lab Results  Component Value Date   ETH 148 (H) 06/08/2023   ETH 265 (H) 04/24/2023    Metabolic Disorder Labs: Lab Results  Component Value Date   HGBA1C 5.6 03/28/2023   MPG 114 03/28/2023   MPG 108.28 05/14/2022   No results found for: "PROLACTIN" Lab Results  Component Value Date   CHOL 199 03/28/2023   TRIG 157 (H) 03/28/2023   HDL 64 03/28/2023   CHOLHDL 3.1  03/28/2023   VLDL 31 03/28/2023   LDLCALC 104 (H) 03/28/2023   LDLCALC 120  (H) 05/14/2022    Physical Findings: AIMS:  , ,  ,  ,    CIWA:  CIWA-Ar Total: 5 COWS:     Musculoskeletal: Strength & Muscle Tone: within normal limits Gait & Station: normal Patient leans: N/A  Psychiatric Specialty Exam:  Presentation  General Appearance:  Appropriate for Environment  Eye Contact: Fair  Speech: Clear and Coherent  Speech Volume: Normal  Handedness: Right   Mood and Affect  Mood: Dysphoric  Affect: Congruent   Thought Process  Thought Processes: Coherent; Goal Directed  Descriptions of Associations:Intact  Orientation:Full (Time, Place and Person)  Thought Content:Logical  History of Schizophrenia/Schizoaffective disorder:No  Duration of Psychotic Symptoms:N/A  Hallucinations:No data recorded Ideas of Reference:None  Suicidal Thoughts:No data recorded Homicidal Thoughts:No data recorded  Sensorium  Memory: Immediate Fair; Recent Fair; Remote Fair  Judgment: Fair  Insight: Fair   Art therapist  Concentration: Fair  Attention Span: Fair  Recall: [employer] of Knowledge: Fair  Language: Fair   Psychomotor Activity  Psychomotor Activity:No data recorded  Assets  Assets: Communication Skills; Desire for Improvement; Financial Resources/Insurance; Intimacy; Leisure Time; Social Support   Sleep  Sleep:No data recorded   Physical Exam: Physical Exam Vitals and nursing note reviewed.  Constitutional:      Appearance: Normal appearance.  HENT:     Head: Normocephalic and atraumatic.     Mouth/Throat:     Pharynx: Oropharynx is clear.  Eyes:     Pupils: Pupils are equal, round, and reactive to light.  Cardiovascular:     Rate and Rhythm: Normal rate and regular rhythm.  Pulmonary:     Effort: Pulmonary effort is normal.     Breath sounds: Normal breath sounds.  Abdominal:     General: Abdomen is flat.     Palpations: Abdomen is soft.  Musculoskeletal:        General: Normal range of  motion.  Skin:    General: Skin is warm and dry.  Neurological:     General: No focal deficit present.     Mental Status: He is alert. Mental status is at baseline.  Psychiatric:        Attention and Perception: Attention normal.        Mood and Affect: Mood normal.        Speech: Speech normal.        Behavior: Behavior normal.        Thought Content: Thought content normal.        Cognition and Memory: Cognition normal.    Review of Systems  Constitutional: Negative.   HENT: Negative.    Eyes: Negative.   Respiratory: Negative.    Cardiovascular: Negative.   Gastrointestinal: Negative.   Musculoskeletal: Negative.   Skin: Negative.   Neurological: Negative.   Psychiatric/Behavioral: Negative.     Blood pressure 121/84, pulse (!) 46, temperature (!) 97.5 F (36.4 C), temperature source Oral, resp. rate 19, height 6' (1.829 m), weight 99.8 kg, SpO2 100 %. Body mass index is 29.84 kg/m.   Treatment Plan Summary: Plan anticipate discharge tomorrow morning.  We will prepare for discharge by getting prescriptions ready.  Encourage group attendance.  Supportive counseling.  No other change to treatment plan.  Mordecai Rasmussen, MD 06/11/2023, 10:52 AM

## 2023-06-11 NOTE — Group Note (Signed)
Date:  06/11/2023 Time:  9:00 AM  Group Topic/Focus:  Goals Group:   The focus of this group is to help patients establish daily goals to achieve during treatment and discuss how the patient can incorporate goal setting into their daily lives to aide in recovery.  Community Group   Participation Level:  Active  Participation Quality:  Appropriate  Affect:  Appropriate  Cognitive:  Appropriate  Insight: Appropriate  Engagement in Group:  Engaged  Modes of Intervention:  Education  Additional Comments:    Landis Dowdy A Emmilia Sowder 06/11/2023, 11:42 AM

## 2023-06-11 NOTE — Group Note (Signed)
Recreation Therapy Group Note   Group Topic:Problem Solving  Group Date: 06/11/2023 Start Time: 1000 End Time: 1040 Facilitators: Rosina Lowenstein, LRT, CTRS Location:  Craft Room  Group Description: Life Boat. Patients were given the scenario that they are on a boat that is about to become shipwrecked, leaving them stranded on an [place]. They are asked to make a list of 15 different items that they want to take with them when they are stranded on the [place]. Patients are asked to rank their items from most important to least important, #1 being the most important and #15 being the least. Patients will work individually for the first round to come up with 15 items and then pair up with a peer(s) to condense their list and come up with one list of 15 items between the two of them. Patients or LRT will read aloud the 15 different items to the group after each round. LRT facilitated post-activity processing to discuss how this activity can be used in daily life post discharge.   Goal Area(s) Addressed:  Patient will identify priorities, wants and needs. Patient will communicate with LRT and peers. Patient will work collectively as a Administrator, Civil Service. Patient will work on Product manager.   Affect/Mood: Full range and Happy   Participation Level: Hyperverbal   Participation Quality: Moderate Cues   Behavior: Distracted   Speech/Thought Process: Flight of ideas   Insight: Fair   Judgement: Poor   Modes of Intervention: Activity   Patient Response to Interventions:  Receptive   Education Outcome:  Acknowledges education   Clinical Observations/Individualized Feedback: John Vaughan was somewhat active in their participation of session activities and group discussion. Pt identified "food, pot, pan, boat, tarp" as some of the items he will being with him on the [name]. Pt required many cues and redirection from LRT to stop talking and interrupting LRT from giving directions. Pt seemed to be  feeding off peer.   Plan: Continue to engage patient in RT group sessions 2-3x/week.   Rosina Lowenstein, LRT, CTRS 06/11/2023 11:14 AM

## 2023-06-11 NOTE — Progress Notes (Signed)
Patient denies SI, HI, and AVH. He rates depression as a 5/10 and anxiety as an 8/10. He says his goal is to work on getting out of the hospital. He denies any physical problems. He is compliant with scheduled medications and remains safe on the unit at this time.

## 2023-06-11 NOTE — Group Note (Signed)
Date:  06/11/2023 Time:  10:24 PM  Group Topic/Focus:  Wrap-Up Group:   The focus of this group is to help patients review their daily goal of treatment and discuss progress on daily workbooks.    Participation Level:  Active  Participation Quality:  Appropriate, Attentive, and Supportive  Affect:  Appropriate  Cognitive:  Alert and Appropriate  Insight: Appropriate and Good  Engagement in Group:  Developing/Improving and Supportive  Modes of Intervention:  Support  Additional Comments:     Belva Crome 06/11/2023, 10:24 PM

## 2023-06-12 DIAGNOSIS — F332 Major depressive disorder, recurrent severe without psychotic features: Secondary | ICD-10-CM | POA: Diagnosis not present

## 2023-06-12 NOTE — Discharge Summary (Signed)
Physician Discharge Summary Note  Patient:  John Vaughan is an 32 y.o., male MRN:  [id number] DOB:  12-Mar-1991 Patient phone:  [phone] (home)  Patient address:   [address] [place] [address],  Total Time spent with patient: 30 minutes  Date of Admission:  06/09/2023 Date of Discharge: 06/12/2023  Reason for Admission: Patient admitted because of suicidal ideation and ongoing alcohol abuse  Principal Problem: Severe recurrent major depression without psychotic features (HCC) Discharge Diagnoses: Principal Problem:   Severe recurrent major depression without psychotic features (HCC) Active Problems:   Alcohol dependence (HCC)   History of seizures   Past Psychiatric History: Past history of depression and mood instability alcohol abuse seizure disorder  Past Medical History:  Past Medical History:  Diagnosis Date   Alcohol abuse    Anxiety    Bipolar 2 disorder (HCC)    Depression    Seizures (HCC)     Past Surgical History:  Procedure Laterality Date   HAND SURGERY Right    Family History:  Family History  Problem Relation Age of Onset   Anxiety disorder Mother    Alcohol abuse Father    Anxiety disorder Maternal Aunt    Anxiety disorder Maternal Grandmother    Alcohol abuse Paternal Grandfather    Family Psychiatric  History: See previous Social History:  Social History   Substance and Sexual Activity  Alcohol Use Yes   Alcohol/week: 2.0 standard drinks of alcohol   Types: 2 Cans of beer per week   Comment: 48 beers a week     Social History   Substance and Sexual Activity  Drug Use Yes   Types: Cocaine    Social History   Socioeconomic History   Marital status: Single    Spouse name: Not on file   Number of children: Not on file   Years of education: Not on file   Highest education level: Not on file  Occupational History   Not on file  Tobacco Use   Smoking status: Every Day    Packs/day: 0.25    Years: 15.00     Additional pack years: 0.00    Total pack years: 3.75    Types: Cigarettes   Smokeless tobacco: Former    Types: Snuff   Tobacco comments:    Pt states plans to stop drinking. Will deal with tobacco cessation later.  Vaping Use   Vaping Use: Never used  Substance and Sexual Activity   Alcohol use: Yes    Alcohol/week: 2.0 standard drinks of alcohol    Types: 2 Cans of beer per week    Comment: 48 beers a week   Drug use: Yes    Types: Cocaine   Sexual activity: Not Currently  Other Topics Concern   Not on file  Social History Narrative   Not on file   Social Determinants of Health   Financial Resource Strain: Not on file  Food Insecurity: Food Insecurity Present (06/09/2023)   Hunger Vital Sign    Worried About Running Out of Food in the Last Year: Sometimes true    Ran Out of Food in the Last Year: Sometimes true  Transportation Needs: Unmet Transportation Needs (06/09/2023)   PRAPARE - Administrator, Civil Service (Medical): Yes    Lack of Transportation (Non-Medical): Yes  Physical Activity: Not on file  Stress: Not on file  Social Connections: Not on file    Hospital Course: Admitted to hospital.  15-minute checks  continued.  Restarted appropriate medication.  Engaged in individual and group therapy.  Patient did not display any dangerous behavior in the hospital.  He was lucid and appropriate and agrees to appropriate outpatient treatment with medication management and therapy and substance abuse treatment.  Physical Findings: AIMS:  , ,  ,  ,    CIWA:  CIWA-Ar Total: 0 COWS:     Musculoskeletal: Strength & Muscle Tone: within normal limits Gait & Station: normal Patient leans: N/A   Psychiatric Specialty Exam:  Presentation  General Appearance:  Appropriate for Environment  Eye Contact: Fair  Speech: Clear and Coherent  Speech Volume: Normal  Handedness: Right   Mood and Affect   Mood: Dysphoric  Affect: Congruent   Thought Process  Thought Processes: Coherent; Goal Directed  Descriptions of Associations:Intact  Orientation:Full (Time, Place and Person)  Thought Content:Logical  History of Schizophrenia/Schizoaffective disorder:No  Duration of Psychotic Symptoms:N/A  Hallucinations:No data recorded Ideas of Reference:None  Suicidal Thoughts:No data recorded Homicidal Thoughts:No data recorded  Sensorium  Memory: Immediate Fair; Recent Fair; Remote Fair  Judgment: Fair  Insight: Fair   Art therapist  Concentration: Fair  Attention Span: Fair  Recall: [employer] of Knowledge: Fair  Language: Fair   Psychomotor Activity  Psychomotor Activity:No data recorded  Assets  Assets: Communication Skills; Desire for Improvement; Financial Resources/Insurance; Intimacy; Leisure Time; Social Support   Sleep  Sleep:No data recorded   Physical Exam: Physical Exam Vitals and nursing note reviewed.  Constitutional:      Appearance: Normal appearance.  HENT:     Head: Normocephalic and atraumatic.     Mouth/Throat:     Pharynx: Oropharynx is clear.  Eyes:     Pupils: Pupils are equal, round, and reactive to light.  Cardiovascular:     Rate and Rhythm: Normal rate and regular rhythm.  Pulmonary:     Effort: Pulmonary effort is normal.     Breath sounds: Normal breath sounds.  Abdominal:     General: Abdomen is flat.     Palpations: Abdomen is soft.  Musculoskeletal:        General: Normal range of motion.  Skin:    General: Skin is warm and dry.  Neurological:     General: No focal deficit present.     Mental Status: He is alert. Mental status is at baseline.  Psychiatric:        Attention and Perception: Attention normal.        Mood and Affect: Mood normal.        Speech: Speech normal.        Behavior: Behavior is cooperative.        Thought Content: Thought content normal.        Cognition and Memory:  Cognition normal.        Judgment: Judgment normal.    Review of Systems  Constitutional: Negative.   HENT: Negative.    Eyes: Negative.   Respiratory: Negative.    Cardiovascular: Negative.   Gastrointestinal: Negative.   Musculoskeletal: Negative.   Skin: Negative.   Neurological: Negative.   Psychiatric/Behavioral: Negative.     Blood pressure 113/80, pulse 69, temperature 97.6 F (36.4 C), temperature source Oral, resp. rate 17, height 6' (1.829 m), weight 99.8 kg, SpO2 100 %. Body mass index is 29.84 kg/m.   Social History   Tobacco Use  Smoking Status Every Day   Packs/day: 0.25   Years: 15.00   Additional pack years: 0.00   Total  pack years: 3.75   Types: Cigarettes  Smokeless Tobacco Former   Types: Snuff  Tobacco Comments   Pt states plans to stop drinking. Will deal with tobacco cessation later.   Tobacco Cessation:  A prescription for an FDA-approved tobacco cessation medication provided at discharge   Blood Alcohol level:  Lab Results  Component Value Date   ETH 148 (H) 06/08/2023   ETH 265 (H) 04/24/2023    Metabolic Disorder Labs:  Lab Results  Component Value Date   HGBA1C 5.6 03/28/2023   MPG 114 03/28/2023   MPG 108.28 05/14/2022   No results found for: "PROLACTIN" Lab Results  Component Value Date   CHOL 199 03/28/2023   TRIG 157 (H) 03/28/2023   HDL 64 03/28/2023   CHOLHDL 3.1 03/28/2023   VLDL 31 03/28/2023   LDLCALC 104 (H) 03/28/2023   LDLCALC 120 (H) 05/14/2022    See Psychiatric Specialty Exam and Suicide Risk Assessment completed by Attending Physician prior to discharge.  Discharge destination:  Home  Is patient on multiple antipsychotic therapies at discharge:  No   Has Patient had three or more failed trials of antipsychotic monotherapy by history:  No  Recommended Plan for Multiple Antipsychotic Therapies: NA  Discharge Instructions     Diet - low sodium heart healthy   Complete by: As directed    Increase  activity slowly   Complete by: As directed       Allergies as of 06/12/2023   No Known Allergies      Medication List     STOP taking these medications    haloperidol 2 MG tablet Commonly known as: HALDOL   hydrOXYzine 25 MG tablet Commonly known as: ATARAX   ondansetron 4 MG disintegrating tablet Commonly known as: ZOFRAN-ODT       TAKE these medications      Indication  busPIRone 15 MG tablet Commonly known as: BUSPAR Take 1 tablet (15 mg total) by mouth 3 (three) times daily.  Indication: Anxiety Disorder, Major Depressive Disorder   famotidine 20 MG tablet Commonly known as: PEPCID Take 1 tablet (20 mg total) by mouth 2 (two) times daily.  Indication: Gastroesophageal Reflux Disease   FLUoxetine 40 MG capsule Commonly known as: PROZAC Take 1 capsule (40 mg total) by mouth daily.  Indication: Major Depressive Disorder   gabapentin 300 MG capsule Commonly known as: NEURONTIN Take 2 capsules (600 mg total) by mouth 3 (three) times daily.  Indication: Abuse or Misuse of Alcohol, Agitation   levETIRAcetam 500 MG tablet Commonly known as: KEPPRA Take 1 tablet (500 mg total) by mouth 2 (two) times daily.  Indication: Seizure   Nicotine Step 1 21 mg/24hr patch Generic drug: nicotine Place 1 patch (21 mg total) onto the skin daily.  Indication: Nicotine Addiction   pantoprazole 40 MG tablet Commonly known as: PROTONIX Take 1 tablet (40 mg total) by mouth daily.  Indication: Gastroesophageal Reflux Disease   traZODone 150 MG tablet Commonly known as: DESYREL Take 1 tablet (150 mg total) by mouth at bedtime as needed for sleep. What changed:  when to take this reasons to take this  Indication: Trouble Sleeping         Follow-up recommendations:  Other:  Patient is being discharged home with medications and a supply of medicine and referral for follow-up in home community.  Encourage patient to engage in appropriate substance abuse  treatment  Comments: See above  Signed: Mordecai Rasmussen, MD 06/12/2023, 9:56 AM

## 2023-06-12 NOTE — Progress Notes (Signed)
Patient observed in dayroom most of shift. Noted watching tv with and talking with some of his peers. Denies any SI, Hi AVH, reports he does not need to drink due to his violent episodes. Patient requested prn for sleep. Currenty not effective. Vetaran in dayroom watching tv. Encouragement and support provided. Safety checks maintained. Medications given as prescribed. Pt receptive and remains safe on unit with q 15 min checks

## 2023-06-12 NOTE — Progress Notes (Signed)
Pt discharge from with all belongings. Pt verbalized understanding of discharge instructions. Pt denies all suicidal or homicidal ideation and pt denies AVH. Pt ambulated from unit escorted by staff member

## 2023-06-12 NOTE — Progress Notes (Signed)
  [facility] Discharge Plan :  Will you be returning to the same living situation after discharge:  Yes,  pt reports that he is returning home. At discharge, do you have transportation home?: Yes,  pt reports that his partner will provide transportation. Do you have the ability to pay for your medications: Yes,  [facility]  Release of information consent forms completed and in the chart;  Patient's signature needed at discharge.  Patient to Follow up at:  Follow-up Information     [facility] Follow up.   Specialty: Professional Counselor Why: Walk in hours are from 8:30AM-12PM and 1PM-2:30PM Contact information: [employer] [facility] the [place] [address] [place] [address] [phone]                 Next level of care provider has access to [facility] Link:no  Safety Planning and Suicide Prevention discussed: Yes,  SPE completed with the patient.     Has patient been referred to the Quitline?: Patient refused referral for treatment  Patient has been referred for addiction treatment: Patient refused referral for treatment.  Harden Mo, LCSW 06/12/2023, 10:00 AM

## 2023-06-12 NOTE — BHH Suicide Risk Assessment (Signed)
[facility] Discharge Suicide Risk Assessment   Principal Problem: Severe recurrent major depression without psychotic features (HCC) Discharge Diagnoses: Principal Problem:   Severe recurrent major depression without psychotic features (HCC) Active Problems:   Alcohol dependence (HCC)   History of seizures   Total Time spent with patient: 30 minutes  Musculoskeletal: Strength & Muscle Tone: within normal limits Gait & Station: normal Patient leans: N/A  Psychiatric Specialty Exam  Presentation  General Appearance:  Appropriate for Environment  Eye Contact: Fair  Speech: Clear and Coherent  Speech Volume: Normal  Handedness: Right   Mood and Affect  Mood: Dysphoric  Duration of Depression Symptoms: Greater than two weeks  Affect: Congruent   Thought Process  Thought Processes: Coherent; Goal Directed  Descriptions of Associations:Intact  Orientation:Full (Time, Place and Person)  Thought Content:Logical  History of Schizophrenia/Schizoaffective disorder:No  Duration of Psychotic Symptoms:N/A  Hallucinations:No data recorded Ideas of Reference:None  Suicidal Thoughts:No data recorded Homicidal Thoughts:No data recorded  Sensorium  Memory: Immediate Fair; Recent Fair; Remote Fair  Judgment: Fair  Insight: Fair   Art therapist  Concentration: Fair  Attention Span: Fair  Recall: [employer] of Knowledge: Fair  Language: Fair   Psychomotor Activity  Psychomotor Activity:No data recorded  Assets  Assets: Communication Skills; Desire for Improvement; Financial Resources/Insurance; Intimacy; Leisure Time; Social Support   Sleep  Sleep:No data recorded  Physical Exam: Physical Exam Constitutional:      Appearance: Normal appearance.  HENT:     Head: Normocephalic and atraumatic.     Mouth/Throat:     Pharynx: Oropharynx is clear.  Eyes:     Pupils: Pupils are equal, round, and reactive to light.  Cardiovascular:      Rate and Rhythm: Normal rate and regular rhythm.  Pulmonary:     Effort: Pulmonary effort is normal.     Breath sounds: Normal breath sounds.  Abdominal:     General: Abdomen is flat.     Palpations: Abdomen is soft.  Musculoskeletal:        General: Normal range of motion.  Skin:    General: Skin is warm and dry.  Neurological:     General: No focal deficit present.     Mental Status: He is alert. Mental status is at baseline.  Psychiatric:        Mood and Affect: Mood normal.        Thought Content: Thought content normal.    Review of Systems  Constitutional: Negative.   HENT: Negative.    Eyes: Negative.   Respiratory: Negative.    Cardiovascular: Negative.   Gastrointestinal: Negative.   Musculoskeletal: Negative.   Skin: Negative.   Neurological: Negative.   Psychiatric/Behavioral: Negative.     Blood pressure 113/80, pulse 69, temperature 97.6 F (36.4 C), temperature source Oral, resp. rate 17, height 6' (1.829 m), weight 99.8 kg, SpO2 100 %. Body mass index is 29.84 kg/m.  Mental Status Per Nursing Assessment::   On Admission:  Suicide plan  Demographic Factors:  Male and Caucasian  Loss Factors: Financial problems/change in socioeconomic status  Historical Factors: Impulsivity  Risk Reduction Factors:   Sense of responsibility to family, Positive social support, and Positive therapeutic relationship  Continued Clinical Symptoms:  Depression:   Impulsivity Alcohol/Substance Abuse/Dependencies  Cognitive Features That Contribute To Risk:  None    Suicide Risk:  Minimal: No identifiable suicidal ideation.  Patients presenting with no risk factors but with morbid ruminations; may be classified as minimal risk based on  the severity of the depressive symptoms    Plan Of Care/Follow-up recommendations:  Other:  Patient denies suicidal ideation.  He has displayed no dangerous behavior in the hospital.  He is alert and oriented lucid with  appropriate affect.  Agrees to appropriate outpatient care.  No indication of dangerousness at discharge.  Mordecai Rasmussen, MD 06/12/2023, 9:55 AM

## 2023-06-12 NOTE — Plan of Care (Signed)
  Problem: Education: Goal: Knowledge of General Education information will improve Description: Including pain rating scale, medication(s)/side effects and non-pharmacologic comfort measures Outcome: Progressing   Problem: Coping: Goal: Level of anxiety will decrease Outcome: Progressing   Problem: Pain Managment: Goal: General experience of comfort will improve Outcome: Progressing   

## 2023-06-13 ENCOUNTER — Encounter (HOSPITAL_COMMUNITY): Payer: No Typology Code available for payment source | Admitting: Psychiatry

## 2023-06-15 ENCOUNTER — Emergency Department (HOSPITAL_COMMUNITY)
Admission: EM | Admit: 2023-06-15 | Discharge: 2023-06-16 | Disposition: A | Discharge status: Home / Self Care | Payer: Medicaid Other | Attending: Emergency Medicine | Admitting: Emergency Medicine

## 2023-06-15 ENCOUNTER — Other Ambulatory Visit: Payer: Self-pay

## 2023-06-15 DIAGNOSIS — R569 Unspecified convulsions: Secondary | ICD-10-CM | POA: Diagnosis present

## 2023-06-15 NOTE — ED Triage Notes (Signed)
BIBA from home for seizures. Per pt, he has had approx 12 seizures today- witnessed by his girlfriend. Pt was seen at [facility] earlier today and received Keppra.

## 2023-06-15 NOTE — ED Notes (Signed)
Urine sample sent to the lab

## 2023-06-16 ENCOUNTER — Encounter (HOSPITAL_COMMUNITY): Payer: Self-pay | Admitting: Emergency Medicine

## 2023-06-16 ENCOUNTER — Emergency Department (HOSPITAL_COMMUNITY): Payer: Medicaid Other

## 2023-06-16 LAB — CBC WITH DIFFERENTIAL/PLATELET
Abs Immature Granulocytes: 0.01 10*3/uL (ref 0.00–0.07)
Basophils Absolute: 0 10*3/uL (ref 0.0–0.1)
Basophils Relative: 1 %
Eosinophils Absolute: 0 10*3/uL (ref 0.0–0.5)
Eosinophils Relative: 1 %
HCT: 27.7 % — ABNORMAL LOW (ref 39.0–52.0)
Hemoglobin: 9.3 g/dL — ABNORMAL LOW (ref 13.0–17.0)
Immature Granulocytes: 0 %
Lymphocytes Relative: 41 %
Lymphs Abs: 1.6 10*3/uL (ref 0.7–4.0)
MCH: 32.9 pg (ref 26.0–34.0)
MCHC: 33.6 g/dL (ref 30.0–36.0)
MCV: 97.9 fL (ref 80.0–100.0)
Monocytes Absolute: 0.5 10*3/uL (ref 0.1–1.0)
Monocytes Relative: 12 %
Neutro Abs: 1.8 10*3/uL (ref 1.7–7.7)
Neutrophils Relative %: 45 %
Platelets: 192 10*3/uL (ref 150–400)
RBC: 2.83 MIL/uL — ABNORMAL LOW (ref 4.22–5.81)
RDW: 12.2 % (ref 11.5–15.5)
WBC: 3.9 10*3/uL — ABNORMAL LOW (ref 4.0–10.5)
nRBC: 0 % (ref 0.0–0.2)

## 2023-06-16 LAB — RAPID URINE DRUG SCREEN, HOSP PERFORMED
Amphetamines: NOT DETECTED
Barbiturates: NOT DETECTED
Benzodiazepines: NOT DETECTED
Cocaine: NOT DETECTED
Opiates: NOT DETECTED
Tetrahydrocannabinol: NOT DETECTED

## 2023-06-16 LAB — MAGNESIUM: Magnesium: 2.2 mg/dL (ref 1.7–2.4)

## 2023-06-16 LAB — BASIC METABOLIC PANEL
Anion gap: 11 (ref 5–15)
BUN: 9 mg/dL (ref 6–20)
CO2: 22 mmol/L (ref 22–32)
Calcium: 7.6 mg/dL — ABNORMAL LOW (ref 8.9–10.3)
Chloride: 109 mmol/L (ref 98–111)
Creatinine, Ser: 0.84 mg/dL (ref 0.61–1.24)
GFR, Estimated: >60 mL/min (ref >60)
Glucose, Bld: 87 mg/dL (ref 70–99)
Potassium: 3.5 mmol/L (ref 3.5–5.1)
Sodium: 142 mmol/L (ref 135–145)

## 2023-06-16 LAB — ETHANOL: Alcohol, Ethyl (B): 300 mg/dL — ABNORMAL HIGH (ref <10)

## 2023-06-16 MED ORDER — LORAZEPAM 2 MG/ML IJ SOLN: 0.0000 mg | Freq: Four times a day (QID) | INTRAMUSCULAR | Status: DC

## 2023-06-16 MED ORDER — LEVETIRACETAM 750 MG PO TABS: 750.0000 mg | ORAL_TABLET | Freq: Two times a day (BID) | ORAL | 1 refills | Status: DC

## 2023-06-16 MED ORDER — THIAMINE MONONITRATE 100 MG PO TABS
100.0000 mg | ORAL_TABLET | Freq: Every day | ORAL | Status: DC
  Administered 2023-06-16: 100 mg via ORAL
  Filled 2023-06-16: qty 1

## 2023-06-16 MED ORDER — LORAZEPAM 1 MG PO TABS: 0.0000 mg | ORAL_TABLET | Freq: Two times a day (BID) | ORAL | Status: DC

## 2023-06-16 MED ORDER — THIAMINE HCL 100 MG/ML IJ SOLN: 100.0000 mg | Freq: Every day | INTRAMUSCULAR | Status: DC

## 2023-06-16 MED ORDER — LORAZEPAM 1 MG PO TABS
0.0000 mg | ORAL_TABLET | Freq: Four times a day (QID) | ORAL | Status: DC
  Administered 2023-06-16: 2 mg via ORAL
  Filled 2023-06-16: qty 2

## 2023-06-16 MED ORDER — LEVETIRACETAM 500 MG PO TABS
750.0000 mg | ORAL_TABLET | Freq: Once | ORAL | Status: AC
  Administered 2023-06-16: 750 mg via ORAL
  Filled 2023-06-16: qty 1

## 2023-06-16 MED ORDER — LORAZEPAM 2 MG/ML IJ SOLN: 0.0000 mg | Freq: Two times a day (BID) | INTRAMUSCULAR | Status: DC

## 2023-06-16 NOTE — ED Provider Notes (Signed)
[place] EMERGENCY DEPARTMENT AT [facility] Provider Note   CSN: [id number] Arrival date & time: 06/15/23  2252     History  Chief Complaint  Patient presents with   Seizures    John Vaughan is a 32 y.o. male.  HPI   Patient with medical history including epilepsy, pseudoseizures, alcohol dependency with alcoholic seizures, anxiety, bipolar, presenting with complaints of seizures.  Patient states that today has had multiple seizures, around 12, most recent was around 9 PM yesterday, states that he had full body shaking, lasted between 1 to 2 minutes, and he had a postictal state.  He admits during one of his seizures today he bit his tongue and became incontinent.  States that he takes Keppra and gabapentin, states that he missed his dose earlier today, notes that he went to [facility] ER where they loaded him with Keppra and discharged him home.  He states upon his arrival home he had multiple seizures.  He states that he has not had strings of seizures over the last month, admits having a slight cough over the last month but no associate fevers chills stomach pains nausea vomiting general body aches.  He admits to alcohol use, has been drinking about 12 beers daily, states he last drank around 8 PM today, patient has not yet seen a neurologist.  I reviewed patient's chart has extensive psychiatric history, was most recently seen and admitted at behavioral health on the 16th, due to suicidal ideations, after stabilization he was later discharged home.  Seen at [facility] ER where they provided him with 2 g of Keppra obtain screening lab workup and discharged him home.  Home Medications Prior to Admission medications   Medication Sig Start Date End Date Taking? Authorizing Provider  busPIRone (BUSPAR) 15 MG tablet Take 1 tablet (15 mg total) by mouth 3 (three) times daily. 06/11/23  Yes Clapacs, Jackquline Denmark, MD  famotidine (PEPCID) 20 MG tablet Take 1 tablet (20 mg total)  by mouth 2 (two) times daily. 06/11/23  Yes Clapacs, Jackquline Denmark, MD  FLUoxetine (PROZAC) 40 MG capsule Take 1 capsule (40 mg total) by mouth daily. 06/11/23  Yes Clapacs, Jackquline Denmark, MD  gabapentin (NEURONTIN) 300 MG capsule Take 2 capsules (600 mg total) by mouth 3 (three) times daily. 06/11/23  Yes Clapacs, Jackquline Denmark, MD  levETIRAcetam (KEPPRA) 750 MG tablet Take 1 tablet (750 mg total) by mouth 2 (two) times daily. 06/16/23 08/15/23 Yes Carroll Sage, PA-C  pantoprazole (PROTONIX) 40 MG tablet Take 1 tablet (40 mg total) by mouth daily. 06/11/23  Yes Clapacs, Jackquline Denmark, MD  traZODone (DESYREL) 150 MG tablet Take 1 tablet (150 mg total) by mouth at bedtime as needed for sleep. 06/11/23  Yes Clapacs, Jackquline Denmark, MD  nicotine (NICODERM CQ - DOSED IN MG/24 HOURS) 21 mg/24hr patch Place 1 patch (21 mg total) onto the skin daily. 06/12/23   Clapacs, Jackquline Denmark, MD      Allergies    Patient has no known allergies.    Review of Systems   Review of Systems  Constitutional:  Negative for chills and fever.  Respiratory:  Negative for shortness of breath.   Cardiovascular:  Negative for chest pain.  Gastrointestinal:  Negative for abdominal pain.  Neurological:  Positive for seizures. Negative for headaches.    Physical Exam Updated Vital Signs BP 97/66   Pulse 82   Temp (!) 97.4 F (36.3 C) (Axillary)   Resp 17  SpO2 94%  Physical Exam Vitals and nursing note reviewed.  Constitutional:      General: He is not in acute distress.    Appearance: He is not ill-appearing.  HENT:     Head: Normocephalic and atraumatic.     Nose: No congestion.  Eyes:     Conjunctiva/sclera: Conjunctivae normal.  Cardiovascular:     Rate and Rhythm: Normal rate and regular rhythm.     Pulses: Normal pulses.     Heart sounds: No murmur heard.    No friction rub. No gallop.  Pulmonary:     Effort: No respiratory distress.     Breath sounds: No wheezing, rhonchi or rales.  Skin:    General: Skin is warm and dry.   Neurological:     Mental Status: He is alert.     Comments: Cranial nerves II through XII grossly intact, no difficulty with word finding, following two-step commands, there is no unilateral weakness present.  Psychiatric:        Mood and Affect: Mood normal.     ED Results / Procedures / Treatments   Labs (all labs ordered are listed, but only abnormal results are displayed) Labs Reviewed  ETHANOL - Abnormal; Notable for the following components:      Result Value   Alcohol, Ethyl (B) 300 (*)    All other components within normal limits  BASIC METABOLIC PANEL - Abnormal; Notable for the following components:   Calcium 7.6 (*)    All other components within normal limits  CBC WITH DIFFERENTIAL/PLATELET - Abnormal; Notable for the following components:   WBC 3.9 (*)    RBC 2.83 (*)    Hemoglobin 9.3 (*)    HCT 27.7 (*)    All other components within normal limits  RAPID URINE DRUG SCREEN, HOSP PERFORMED  MAGNESIUM    EKG EKG Interpretation  Date/Time:  Saturday June 15 2023 23:08:26 EDT Ventricular Rate:  78 PR Interval:  181 QRS Duration: 112 QT Interval:  370 QTC Calculation: 422 R Axis:   50 Text Interpretation: Sinus rhythm Incomplete right bundle branch block Confirmed by Alona Bene [phone]) on 06/15/2023 11:30:07 PM  Radiology CT Head Wo Contrast  Result Date: 06/16/2023 CLINICAL DATA:  Headache, increasing frequency or severity EXAM: CT HEAD WITHOUT CONTRAST TECHNIQUE: Contiguous axial images were obtained from the base of the skull through the vertex without intravenous contrast. RADIATION DOSE REDUCTION: This exam was performed according to the departmental dose-optimization program which includes automated exposure control, adjustment of the mA and/or kV according to patient size and/or use of iterative reconstruction technique. COMPARISON:  MRI head 08/26/2022 FINDINGS: Brain: No evidence of large-territorial acute infarction. No parenchymal hemorrhage. No mass  lesion. No extra-axial collection. No mass effect or midline shift. No hydrocephalus. Basilar cisterns are patent. Vascular: No hyperdense vessel. Skull: No acute fracture or focal lesion. Sinuses/Orbits: Paranasal sinuses and mastoid air cells are clear. The orbits are unremarkable. Other: None. IMPRESSION: No acute intracranial abnormality. Electronically Signed   By: Tish Frederickson M.D.   On: 06/16/2023 01:19    Procedures Procedures    Medications Ordered in ED Medications  LORazepam (ATIVAN) injection 0-4 mg ( Intravenous See Alternative 06/16/23 0050)    Or  LORazepam (ATIVAN) tablet 0-4 mg (2 mg Oral Given 06/16/23 0050)  LORazepam (ATIVAN) injection 0-4 mg (has no administration in time range)    Or  LORazepam (ATIVAN) tablet 0-4 mg (has no administration in time range)  thiamine (VITAMIN B1) tablet  100 mg (100 mg Oral Given 06/16/23 0051)    Or  thiamine (VITAMIN B1) injection 100 mg ( Intravenous See Alternative 06/16/23 0051)  levETIRAcetam (KEPPRA) tablet 750 mg (750 mg Oral Given 06/16/23 0213)    ED Course/ Medical Decision Making/ A&P                             Medical Decision Making Amount and/or Complexity of Data Reviewed Labs: ordered. Radiology: ordered.  Risk OTC drugs. Prescription drug management.   This patient presents to the ED for concern of seizures, this involves an extensive number of treatment options, and is a complaint that carries with it a high risk of complications and morbidity.  The differential diagnosis includes brain mass, CVA, meningitis, metabolic abnormality, alcohol withdrawal    Additional history obtained:  Additional history obtained from partner at bedside External records from outside source obtained and reviewed including recent ER notes   Co morbidities that complicate the patient evaluation  Seizure disorder, alcohol dependency, psychiatric disorder  Social Determinants of Health:  N/A    Lab Tests:  I Ordered,  and personally interpreted labs.  The pertinent results include: CBC shows leukocytopenia with a white count of 3.9, normocytic anemia hemoglobin 9.3, BMP is unremarkable, right ear drug-seeking negative, ethanol elevated at 300s,   Imaging Studies ordered:  I ordered imaging studies including CT head I independently visualized and interpreted imaging which showed negative acute findings I agree with the radiologist interpretation   Cardiac Monitoring:  The patient was maintained on a cardiac monitor.  I personally viewed and interpreted the cardiac monitored which showed an underlying rhythm of: Without signs of ischemia   Medicines ordered and prescription drug management:  I ordered medication including Ativan, thiamine I have reviewed the patients home medicines and have made adjustments as needed  Critical Interventions:  N/A   Reevaluation:  Presents with breakthrough seizures, he had a benign physical exam, will obtain screening lab workup, CT head, start him on CIWA protocol and continue to monitor.  Lab workup and imaging are reassuring, will consult with neurology for further recommendations and management of seizures.  Patient has been observed in the ER for 6 hours without any seizure-like activity, patient is resting comfortably, he is agreement discharge at this time.  Consultations Obtained:  I requested consultation with the Dr. Derry Lory neurology,  and discussed lab and imaging findings as well as pertinent plan - they recommend: Recommends increasing Keppra from 500 twice daily to 750 twice daily, observe in the ER, if no seizure-like activity patient can be discharged home follow-up as an outpatient.  If seizures are observed admission will likely be required.    Test Considered:  N/A    Rule out low suspicion for internal head bleed and or mass as CT imaging is negative for acute findings.  Low suspicion for CVA she has no focal deficit present my  exam.  Low suspicion for dissection of the vertebral or carotid artery as presentation atypical of etiology.  Low suspicion for meningitis as she has no meningeal sign present.  Suspicion for alcohol withdrawals requiring hospitalization is low as he is nontrauma's on my exam, vital signs are reassuring, patient is also nontoxic-appearing.     Dispostion and problem list  After consideration of the diagnostic results and the patients response to treatment, I feel that the patent would benefit from discharge.  Seizure-like activity-unclear if this is true epilepsy  versus pseudoseizures, will increase Keppra to 750 twice daily, and have him follow-up with outpatient neurology for further assessment strict return precautions.            Final Clinical Impression(s) / ED Diagnoses Final diagnoses:  Seizure-like activity [facility])    Rx / DC Orders ED Discharge Orders          Ordered    levETIRAcetam (KEPPRA) 750 MG tablet  2 times daily        06/16/23 0522              Carroll Sage, PA-C 06/16/23 0525    Maia Plan, MD 06/19/23 1819

## 2023-06-16 NOTE — Discharge Instructions (Signed)
Your lab workup and imaging were reassuring.  I have increased your Keppra from 500 to 750, please take as prescribed.  Today only take  750 mg of your Keppra as you have already gotten your first dose while in the ER.  Tomorrow like you start taking it twice daily as prescribed.  Per [facility] law you are not allowed to drive for next 6 months.  I do not recommend swimming.  Please follow-up with your neurologist for further assessment  I have given you outpatient resources for help with alcohol consumption if you feel that you need it.  Come back to the emergency department if you develop chest pain, shortness of breath, severe abdominal pain, uncontrolled nausea, vomiting, diarrhea.

## 2023-06-18 ENCOUNTER — Emergency Department (HOSPITAL_COMMUNITY)
Admission: EM | Admit: 2023-06-18 | Discharge: 2023-06-18 | Disposition: A | Discharge status: Home / Self Care | Payer: Medicaid Other | Attending: Emergency Medicine | Admitting: Emergency Medicine

## 2023-06-18 ENCOUNTER — Encounter (HOSPITAL_COMMUNITY): Payer: Self-pay

## 2023-06-18 ENCOUNTER — Other Ambulatory Visit: Payer: Self-pay

## 2023-06-18 DIAGNOSIS — E876 Hypokalemia: Secondary | ICD-10-CM | POA: Diagnosis not present

## 2023-06-18 DIAGNOSIS — R569 Unspecified convulsions: Secondary | ICD-10-CM | POA: Diagnosis present

## 2023-06-18 LAB — CBC
HCT: 41.1 % (ref 39.0–52.0)
Hemoglobin: 14.5 g/dL (ref 13.0–17.0)
MCH: 32.5 pg (ref 26.0–34.0)
MCHC: 35.3 g/dL (ref 30.0–36.0)
MCV: 92.2 fL (ref 80.0–100.0)
Platelets: 345 10*3/uL (ref 150–400)
RBC: 4.46 MIL/uL (ref 4.22–5.81)
RDW: 12 % (ref 11.5–15.5)
WBC: 4.7 10*3/uL (ref 4.0–10.5)
nRBC: 0 % (ref 0.0–0.2)

## 2023-06-18 LAB — BASIC METABOLIC PANEL
Anion gap: 13 (ref 5–15)
BUN: 8 mg/dL (ref 6–20)
CO2: 23 mmol/L (ref 22–32)
Calcium: 8.6 mg/dL — ABNORMAL LOW (ref 8.9–10.3)
Chloride: 104 mmol/L (ref 98–111)
Creatinine, Ser: 1 mg/dL (ref 0.61–1.24)
GFR, Estimated: >60 mL/min (ref >60)
Glucose, Bld: 98 mg/dL (ref 70–99)
Potassium: 3.3 mmol/L — ABNORMAL LOW (ref 3.5–5.1)
Sodium: 140 mmol/L (ref 135–145)

## 2023-06-18 MED ORDER — LORAZEPAM 1 MG PO TABS
1.0000 mg | ORAL_TABLET | Freq: Once | ORAL | Status: AC
  Administered 2023-06-18: 1 mg via ORAL
  Filled 2023-06-18: qty 1

## 2023-06-18 MED ORDER — POTASSIUM CHLORIDE CRYS ER 20 MEQ PO TBCR
40.0000 meq | EXTENDED_RELEASE_TABLET | Freq: Once | ORAL | Status: AC
  Administered 2023-06-18: 40 meq via ORAL
  Filled 2023-06-18: qty 2

## 2023-06-18 NOTE — ED Provider Notes (Addendum)
[place] EMERGENCY DEPARTMENT AT [facility] Provider Note   CSN: [id number] Arrival date & time: 06/18/23  0104     History  Chief Complaint  Patient presents with   Seizures    John Vaughan is a 32 y.o. male.  HPI     This is a 32 old male with a history of seizures, pseudoseizures, alcohol abuse who presents with concern for ongoing seizure activity.  Patient was seen and evaluated twice on 6/22 for seizure-like activity.  He was loaded with Keppra at that time.  He was observed in the emergency department subsequently at [facility] for greater than 6 hours.  Keppra dosage was increased to 750 twice daily.  Patient reports compliance with medications.  He states he is not drinking as much as he has been and had been previously in the psychiatric hospital on 6/16 where he did undergo some detox.  Family at bedside describes multiple episodes of blanking out and stiffening.  Per EMS report and nursing note, patient had "23 seizures en route."  Chart reviewed.  No EEG in our system.   Home Medications Prior to Admission medications   Medication Sig Start Date End Date Taking? Authorizing Provider  busPIRone (BUSPAR) 15 MG tablet Take 1 tablet (15 mg total) by mouth 3 (three) times daily. 06/11/23   Clapacs, Jackquline Denmark, MD  famotidine (PEPCID) 20 MG tablet Take 1 tablet (20 mg total) by mouth 2 (two) times daily. 06/11/23   Clapacs, Jackquline Denmark, MD  FLUoxetine (PROZAC) 40 MG capsule Take 1 capsule (40 mg total) by mouth daily. 06/11/23   Clapacs, Jackquline Denmark, MD  gabapentin (NEURONTIN) 300 MG capsule Take 2 capsules (600 mg total) by mouth 3 (three) times daily. 06/11/23   Clapacs, Jackquline Denmark, MD  levETIRAcetam (KEPPRA) 750 MG tablet Take 1 tablet (750 mg total) by mouth 2 (two) times daily. 06/16/23 08/15/23  Carroll Sage, PA-C  nicotine (NICODERM CQ - DOSED IN MG/24 HOURS) 21 mg/24hr patch Place 1 patch (21 mg total) onto the skin daily. 06/12/23   Clapacs, Jackquline Denmark, MD   pantoprazole (PROTONIX) 40 MG tablet Take 1 tablet (40 mg total) by mouth daily. 06/11/23   Clapacs, Jackquline Denmark, MD  traZODone (DESYREL) 150 MG tablet Take 1 tablet (150 mg total) by mouth at bedtime as needed for sleep. 06/11/23   Clapacs, Jackquline Denmark, MD      Allergies    Patient has no known allergies.    Review of Systems   Review of Systems  Constitutional:  Negative for fever.  Respiratory:  Negative for shortness of breath.   Cardiovascular:  Negative for chest pain.  Gastrointestinal:  Negative for abdominal pain.  Neurological:  Positive for seizures.  All other systems reviewed and are negative.   Physical Exam Updated Vital Signs BP 115/78   Pulse 94   Temp 98 F (36.7 C) (Oral)   Resp 14   Ht 1.829 m (6')   Wt 98.9 kg   SpO2 95%   BMI 29.57 kg/m  Physical Exam Vitals and nursing note reviewed.  Constitutional:      Appearance: He is well-developed. He is obese. He is not ill-appearing.  HENT:     Head: Normocephalic and atraumatic.  Eyes:     Pupils: Pupils are equal, round, and reactive to light.  Cardiovascular:     Rate and Rhythm: Normal rate and regular rhythm.     Heart sounds: Normal heart sounds. No murmur  heard. Pulmonary:     Effort: Pulmonary effort is normal. No respiratory distress.     Breath sounds: Normal breath sounds. No wheezing.  Abdominal:     General: Bowel sounds are normal.     Palpations: Abdomen is soft.     Tenderness: There is no abdominal tenderness. There is no rebound.  Musculoskeletal:     Cervical back: Neck supple.  Lymphadenopathy:     Cervical: No cervical adenopathy.  Skin:    General: Skin is warm and dry.  Neurological:     Mental Status: He is alert and oriented to person, place, and time.     Comments: Cranial nerves II through XII intact, 5 out of 5 strength in all 4 extremities, no dysmetria to finger-nose-finger  Psychiatric:        Mood and Affect: Mood normal.     ED Results / Procedures / Treatments    Labs (all labs ordered are listed, but only abnormal results are displayed) Labs Reviewed  BASIC METABOLIC PANEL - Abnormal; Notable for the following components:      Result Value   Potassium 3.3 (*)    Calcium 8.6 (*)    All other components within normal limits  CBC    EKG EKG Interpretation  Date/Time:  Tuesday June 18 2023 01:14:03 EDT Ventricular Rate:  81 PR Interval:  180 QRS Duration: 110 QT Interval:  379 QTC Calculation: 440 R Axis:   16 Text Interpretation: Sinus rhythm RSR' in V1 or V2, right VCD or RVH Confirmed by Ross Marcus ([id number]) on 06/18/2023 2:27:17 AM  Radiology No results found.  Procedures Procedures    Medications Ordered in ED Medications  LORazepam (ATIVAN) tablet 1 mg (1 mg Oral Given 06/18/23 0253)  potassium chloride SA (KLOR-CON M) CR tablet 40 mEq (40 mEq Oral Given 06/18/23 0442)    ED Course/ Medical Decision Making/ A&P Clinical Course as of 06/18/23 0512  Tue Jun 18, 2023  6213 Patient has follow-up with neurology on July 3. [CH]  0865 Patient at discharge stated that he was suicidal.  This was after nursing was unable to provide taxi transport for the patient and his fiance.  Both were visibly upset.  Patient stated "you have not done anything for me."  Discussed with him that he previously had a CT scan and a workup that has been reassuring.  It is very important that he follows up with neurology. [CH]    Clinical Course User Index [CH] Elener Custodio, Mayer Masker, MD                             Medical Decision Making Amount and/or Complexity of Data Reviewed Labs: ordered.  Risk Prescription drug management.   This patient presents to the ED for concern of seizure-like activity, this involves an extensive number of treatment options, and is a complaint that carries with it a high risk of complications and morbidity.  I considered the following differential and admission for this acute, potentially life threatening condition.   The differential diagnosis includes seizure, pseudoseizure polysubstance abuse  MDM:    This is a 32 year old male with a history of seizures, pseudoseizures, alcohol abuse who presents with concern for multiple seizures.  Reportedly had multiple seizures en route.  Patient is awake, alert, oriented.  Based on the report of significant number of seizures prior to arrival, question whether these were epileptic versus pseudoseizure.  His neuroexam appears at baseline.  Recently had a CT scan that was negative and lab workup that was reassuring.  Lab workup today is only notable for mild hypokalemia.  Recent increase in his Keppra to 750 twice daily.  Patient resting comfortably in the emergency department.  Do not feel the need for repeat imaging as he is nonfocal.  Do not see EEG in his chart.  He is unsure when he last had 1.  He does have neurology follow-up next week.  At this point, would maintain his Keppra 750 twice daily and have him follow-up closely with neurology.  Discussed with him that he should not be driving.  (Labs, imaging, consults)  Labs: I Ordered, and personally interpreted labs.  The pertinent results include: CBC, BMP  Imaging Studies ordered: I ordered imaging studies including none I independently visualized and interpreted imaging. I agree with the radiologist interpretation  Additional history obtained from chart review.  External records from outside source obtained and reviewed including prior evaluations  Cardiac Monitoring: The patient was maintained on a cardiac monitor.  If on the cardiac monitor, I personally viewed and interpreted the cardiac monitored which showed an underlying rhythm of: Sinus rhythm  Reevaluation: After the interventions noted above, I reevaluated the patient and found that they have :improved  Social Determinants of Health:  lives independently  Disposition: Discharge  Co morbidities that complicate the patient evaluation  Past  Medical History:  Diagnosis Date   Alcohol abuse    Anxiety    Bipolar 2 disorder (HCC)    Depression    Seizures (HCC)      Medicines Meds ordered this encounter  Medications   LORazepam (ATIVAN) tablet 1 mg   potassium chloride SA (KLOR-CON M) CR tablet 40 mEq    I have reviewed the patients home medicines and have made adjustments as needed  Problem List / ED Course: Problem List Items Addressed This Visit   None Visit Diagnoses     Seizure-like activity (HCC)    -  Primary                   Final Clinical Impression(s) / ED Diagnoses Final diagnoses:  Seizure-like activity [facility])    Rx / DC Orders ED Discharge Orders     None         Joby Hershkowitz, Mayer Masker, MD 06/18/23 4098    Shon Baton, MD 06/18/23 [phone]

## 2023-06-18 NOTE — Discharge Instructions (Signed)
Continue your Keppra 750 mg twice daily.  Follow-up with neurology as scheduled on July 3.  You should not drive or operate heavy machinery.

## 2023-06-18 NOTE — ED Triage Notes (Signed)
H/x epilepsy. Pt had 23 seizures en route to hospital with EMS lasting 15-45 seconds each. Pt has been having seizures since Saturday. 5 mg midazolam with EMS.

## 2023-06-19 ENCOUNTER — Ambulatory Visit (HOSPITAL_COMMUNITY): Payer: No Payment, Other | Admitting: Licensed Clinical Social Worker

## 2023-06-19 ENCOUNTER — Other Ambulatory Visit: Payer: Self-pay

## 2023-06-24 ENCOUNTER — Ambulatory Visit: Payer: MEDICAID | Admitting: Neurology

## 2023-06-24 ENCOUNTER — Other Ambulatory Visit: Payer: Self-pay

## 2023-06-24 ENCOUNTER — Encounter (HOSPITAL_COMMUNITY): Payer: Self-pay | Admitting: Emergency Medicine

## 2023-06-24 ENCOUNTER — Emergency Department (HOSPITAL_COMMUNITY): Payer: MEDICAID

## 2023-06-24 ENCOUNTER — Emergency Department (HOSPITAL_COMMUNITY)
Admission: EM | Admit: 2023-06-24 | Discharge: 2023-06-24 | Disposition: A | Discharge status: Home / Self Care | Payer: MEDICAID | Attending: Emergency Medicine | Admitting: Emergency Medicine

## 2023-06-24 DIAGNOSIS — R112 Nausea with vomiting, unspecified: Secondary | ICD-10-CM | POA: Diagnosis present

## 2023-06-24 DIAGNOSIS — R197 Diarrhea, unspecified: Secondary | ICD-10-CM | POA: Insufficient documentation

## 2023-06-24 LAB — CBC WITH DIFFERENTIAL/PLATELET
Abs Immature Granulocytes: 0.03 10*3/uL (ref 0.00–0.07)
Basophils Absolute: 0 10*3/uL (ref 0.0–0.1)
Basophils Relative: 1 %
Eosinophils Absolute: 0.1 10*3/uL (ref 0.0–0.5)
Eosinophils Relative: 1 %
HCT: 41.8 % (ref 39.0–52.0)
Hemoglobin: 14.6 g/dL (ref 13.0–17.0)
Immature Granulocytes: 1 %
Lymphocytes Relative: 31 %
Lymphs Abs: 1.7 10*3/uL (ref 0.7–4.0)
MCH: 32.7 pg (ref 26.0–34.0)
MCHC: 34.9 g/dL (ref 30.0–36.0)
MCV: 93.7 fL (ref 80.0–100.0)
Monocytes Absolute: 0.6 10*3/uL (ref 0.1–1.0)
Monocytes Relative: 10 %
Neutro Abs: 3.1 10*3/uL (ref 1.7–7.7)
Neutrophils Relative %: 56 %
Platelets: 270 10*3/uL (ref 150–400)
RBC: 4.46 MIL/uL (ref 4.22–5.81)
RDW: 12.2 % (ref 11.5–15.5)
WBC: 5.5 10*3/uL (ref 4.0–10.5)
nRBC: 0 % (ref 0.0–0.2)

## 2023-06-24 LAB — COMPREHENSIVE METABOLIC PANEL
ALT: 38 U/L (ref 0–44)
AST: 36 U/L (ref 15–41)
Albumin: 3.8 g/dL (ref 3.5–5.0)
Alkaline Phosphatase: 43 U/L (ref 38–126)
Anion gap: 11 (ref 5–15)
BUN: 6 mg/dL (ref 6–20)
CO2: 23 mmol/L (ref 22–32)
Calcium: 8.7 mg/dL — ABNORMAL LOW (ref 8.9–10.3)
Chloride: 106 mmol/L (ref 98–111)
Creatinine, Ser: 0.78 mg/dL (ref 0.61–1.24)
GFR, Estimated: >60 mL/min (ref >60)
Glucose, Bld: 141 mg/dL — ABNORMAL HIGH (ref 70–99)
Potassium: 3.8 mmol/L (ref 3.5–5.1)
Sodium: 140 mmol/L (ref 135–145)
Total Bilirubin: 0.9 mg/dL (ref 0.3–1.2)
Total Protein: 6.9 g/dL (ref 6.5–8.1)

## 2023-06-24 LAB — LIPASE, BLOOD: Lipase: 33 U/L (ref 11–51)

## 2023-06-24 MED ORDER — ALUM & MAG HYDROXIDE-SIMETH 200-200-20 MG/5ML PO SUSP
30.0000 mL | Freq: Once | ORAL | Status: AC
  Administered 2023-06-24: 30 mL via ORAL
  Filled 2023-06-24: qty 30

## 2023-06-24 MED ORDER — LEVETIRACETAM IN NACL 1500 MG/100ML IV SOLN
1500.0000 mg | Freq: Once | INTRAVENOUS | Status: AC
  Administered 2023-06-24: 1500 mg via INTRAVENOUS
  Filled 2023-06-24: qty 100

## 2023-06-24 MED ORDER — IOHEXOL 300 MG/ML  SOLN
100.0000 mL | Freq: Once | INTRAMUSCULAR | Status: AC | PRN
  Administered 2023-06-24: 100 mL via INTRAVENOUS

## 2023-06-24 MED ORDER — PROMETHAZINE HCL 25 MG RE SUPP: 25.0000 mg | Freq: Four times a day (QID) | RECTAL | 0 refills | Status: DC | PRN

## 2023-06-24 MED ORDER — LACTATED RINGERS IV BOLUS
1000.0000 mL | Freq: Once | INTRAVENOUS | Status: AC
  Administered 2023-06-24: 1000 mL via INTRAVENOUS

## 2023-06-24 MED ORDER — FAMOTIDINE IN NACL 20-0.9 MG/50ML-% IV SOLN
20.0000 mg | Freq: Once | INTRAVENOUS | Status: AC
  Administered 2023-06-24: 20 mg via INTRAVENOUS
  Filled 2023-06-24: qty 50

## 2023-06-24 MED ORDER — ONDANSETRON HCL 4 MG/2ML IJ SOLN
4.0000 mg | Freq: Once | INTRAMUSCULAR | Status: AC
  Administered 2023-06-24: 4 mg via INTRAVENOUS
  Filled 2023-06-24: qty 2

## 2023-06-24 NOTE — ED Notes (Signed)
Pt returned from CT °

## 2023-06-24 NOTE — ED Triage Notes (Signed)
Presents from home for emesis x 24 hrs. Endorses dizziness, unable to keep down any fluids, lower abd pain, alternating constipation and diarrhea, sebj fever.  Denies testicle pain  H/o seizures, was treated in ER earlier in the week for multiple seizures. No seizure activity today. Takes keppra BID. Has not been able to keep down these meds today.

## 2023-06-24 NOTE — ED Notes (Signed)
Pt transport to CT  

## 2023-06-24 NOTE — ED Notes (Signed)
Pt advises being unable to void for urine sample currently, provided pt with urinal. Pt states he will notify staff upon voiding.

## 2023-06-24 NOTE — ED Notes (Signed)
New PIV placed.

## 2023-06-24 NOTE — ED Provider Notes (Signed)
[facility]-EMERGENCY DEPT [facility] Emergency Department Provider Note MRN:  [id number]  Arrival date & time: 06/24/23     Chief Complaint   Emesis   History of Present Illness   John Vaughan is a 32 y.o. year-old male presents to the ED with chief complaint of nausea, vomiting, and diarrhea.  He came in tonight because of the vomiting and his inability to keep down his keppra.  He is nervous he is going to have a seizure.  He denies fevers or chills.  He reports some lower right abdominal tenderness.  Denies fevers.  History provided by patient.   Review of Systems  Pertinent positive and negative review of systems noted in HPI.    Physical Exam   Vitals:   06/24/23 0028  BP: 106/83  Pulse: 81  Resp: (!) 22  Temp: 97.7 F (36.5 C)  SpO2: 98%    CONSTITUTIONAL:  well-appearing, NAD NEURO:  Alert and oriented x 3, CN 3-12 grossly intact EYES:  eyes equal and reactive ENT/NECK:  Supple, no stridor  CARDIO:  normal rate, regular rhythm, appears well-perfused  PULM:  No respiratory distress, CTAB GI/GU:  non-distended, moderate RLQ tenderness MSK/SPINE:  No gross deformities, no edema, moves all extremities  SKIN:  no rash, atraumatic   *Additional and/or pertinent findings included in MDM below  Diagnostic and Interventional Summary    EKG Interpretation Date/Time:    Ventricular Rate:    PR Interval:    QRS Duration:    QT Interval:    QTC Calculation:   R Axis:      Text Interpretation:         Labs Reviewed  COMPREHENSIVE METABOLIC PANEL - Abnormal; Notable for the following components:      Result Value   Glucose, Bld 141 (*)    Calcium 8.7 (*)    All other components within normal limits  CBC WITH DIFFERENTIAL/PLATELET  LIPASE, BLOOD  URINALYSIS, ROUTINE W REFLEX MICROSCOPIC    CT ABDOMEN PELVIS W CONTRAST  Final Result      Medications  levETIRAcetam (KEPPRA) IVPB 1500 mg/ 100 mL premix (0 mg Intravenous Stopped 06/24/23 0117)   lactated ringers bolus 1,000 mL (0 mLs Intravenous Stopped 06/24/23 0315)  ondansetron (ZOFRAN) injection 4 mg (4 mg Intravenous Given 06/24/23 0055)  iohexol (OMNIPAQUE) 300 MG/ML solution 100 mL (100 mLs Intravenous Contrast Given 06/24/23 0232)  famotidine (PEPCID) IVPB 20 mg premix (0 mg Intravenous Stopped 06/24/23 0244)  alum & mag hydroxide-simeth (MAALOX/MYLANTA) 200-200-20 MG/5ML suspension 30 mL (30 mLs Oral Given 06/24/23 0212)     Procedures  /  Critical Care Procedures  ED Course and Medical Decision Making  I have reviewed the triage vital signs, the nursing notes, and pertinent available records from the EMR.  Social Determinants Affecting Complexity of Care: Patient has no clinically significant social determinants affecting this chief complaint..   ED Course:    Medical Decision Making Patient here with nausea, vomiting, and diarrhea.  Hasn't been able to keep down his keppra.  Will give IV fluids and IV keppra.  States that he is having some heartburn, will give IV pepcid.  CT reassuring.  Patient feeling improved.  No seizures.  Will discharge with PCP follow-up.  Amount and/or Complexity of Data Reviewed Labs: ordered.    Details: No leukocytosis No significant electrolyte derangement Lipase is normal  Radiology: ordered.  Risk OTC drugs. Prescription drug management.         Consultants: No consultations were  needed in caring for this patient.   Treatment and Plan: I considered admission due to patient's initial presentation, but after considering the examination and diagnostic results, patient will not require admission and can be discharged with outpatient follow-up.    Final Clinical Impressions(s) / ED Diagnoses     ICD-10-CM   1. Nausea vomiting and diarrhea  R11.2    R19.7       ED Discharge Orders          Ordered    promethazine (PHENERGAN) 25 MG suppository  Every 6 hours PRN        06/24/23 0313              Discharge  Instructions Discussed with and Provided to Patient:   Discharge Instructions   None      Roxy Horseman, PA-C 06/24/23 1610    Palumbo, April, MD 06/24/23 [phone]

## 2023-06-24 NOTE — ED Notes (Signed)
Pt advises again he cannot void at this time for urine sample.

## 2023-06-24 NOTE — ED Notes (Signed)
Pt called out requesting medication for acid reflux. Paramedic notified.

## 2023-06-24 NOTE — ED Notes (Signed)
Advised by CT tech that pt's IV would not work. CT scan not performed.

## 2023-06-26 ENCOUNTER — Encounter: Payer: Self-pay | Admitting: Neurology

## 2023-06-26 ENCOUNTER — Telehealth: Payer: Self-pay | Admitting: Neurology

## 2023-06-26 ENCOUNTER — Ambulatory Visit (INDEPENDENT_AMBULATORY_CARE_PROVIDER_SITE_OTHER): Payer: Medicaid Other | Admitting: Neurology

## 2023-06-26 VITALS — BP 127/94 | HR 74 | Ht 72.0 in | Wt 205.0 lb

## 2023-06-26 DIAGNOSIS — R269 Unspecified abnormalities of gait and mobility: Secondary | ICD-10-CM | POA: Diagnosis not present

## 2023-06-26 DIAGNOSIS — G629 Polyneuropathy, unspecified: Secondary | ICD-10-CM

## 2023-06-26 DIAGNOSIS — F1910 Other psychoactive substance abuse, uncomplicated: Secondary | ICD-10-CM | POA: Diagnosis not present

## 2023-06-26 DIAGNOSIS — G40909 Epilepsy, unspecified, not intractable, without status epilepticus: Secondary | ICD-10-CM | POA: Diagnosis not present

## 2023-06-26 NOTE — Progress Notes (Signed)
[facility]  PATIENT: John Vaughan DOB: September 05, 1991  REFERRING DOCTOR OR PCP: Toy Cookey, NP SOURCE: Patient, note from multiple emergency room visits, imaging and lab reports, MRI and CT personally reviewed.  _________________________________   HISTORICAL  CHIEF COMPLAINT:  Chief Complaint  Patient presents with   Room 10    Pt is here with is Fiance and his Mother. Pt's Fiance states that he had 30 Seizures 2 weeks ago. Pt's memory is not good since his Seizures.     HISTORY OF PRESENT ILLNESS:  I had the pleasure seeing patient, Loy Marett, at [facility] for a neurologic consultation regarding his seizures.  He is a 32 year old man who has had seizures since age 32.  His first seizure was a convulsion and he started kicking while watching TV and was unresponsive x many minutes.   A second spell a little bu later was also a convulsion and he hit his nose breaking it.   More recently he has both staring spells and convulsions, often with the convulsion following the staring spell.  At baseline before a few months ago, he would have 2/month on average.     He has been to the emergency room multiple times in the last 6 months and carries a diagnosis of seizures, pseudoseizures and alcohol abuse.    He has had multiple spells with both staring spells and convulsions in June.  Some spells started with staring spells and then convulsions.    Last week, he had > 20 spells and went to the ED.    He does not have much recall of the day.   His most recent emergency room visit was on 06/18/2023 and he was loaded with Keppra and then placed on an outpatient dose of 750 mg p.o. twice daily.    He has been on Keppra of/on x many years.   No seizure in last 3 day.     He has never had an EEG.  MRI 08/26/2022 shows mild atrophy.    He has a long history of alcohol abuse regularly drinking > 24 beers a day.   He started drinking in his mid teens.    He is currently not drinking.  Seizures occur both with moderate to heavy alcohol use and with withdrawal.     He has had some head trauma but never hospitalized for head injuries.    He never had meningitis or encephalitis.    He reached developmental milestones at the right age according to his mom.   He has a long history of alcohol abuse and also has had suicide ideation.  He was in a psychiatric hospital in mid June for detox.  He reports numbness in his feet since 2018 or so.     Imaging: I personally reviewed the MRI of the brain from 08/26/2022.  It shows mild generalized cortical atrophy, more than would be expected at his age.  There was also mild cerebellar atrophy.  There were no acute findings.  CT scan of the head 06/24/2023 was unchanged :   REVIEW OF SYSTEMS: Constitutional: No fevers, chills, sweats, or change in appetite Eyes: No visual changes, double vision, eye pain Ear, nose and throat: No hearing loss, ear pain, nasal congestion, sore throat Cardiovascular: No chest pain, palpitations Respiratory:  No shortness of breath at rest or with exertion.   No wheezes GastrointestinaI: No nausea, vomiting, diarrhea, abdominal pain, fecal incontinence Genitourinary:  No dysuria, urinary retention or  frequency.  No nocturia. Musculoskeletal:  No neck pain, back pain Integumentary: No rash, pruritus, skin lesions Neurological: as above Psychiatric: No depression at this time.  No anxiety Endocrine: No palpitations, diaphoresis, change in appetite, change in weigh or increased thirst Hematologic/Lymphatic:  No anemia, purpura, petechiae. Allergic/Immunologic: No itchy/runny eyes, nasal congestion, recent allergic reactions, rashes  ALLERGIES: No Known Allergies  HOME MEDICATIONS:  Current Outpatient Medications:    busPIRone (BUSPAR) 15 MG tablet, Take 1 tablet (15 mg total) by mouth 3 (three) times daily., Disp: 45 tablet, Rfl: 0   famotidine (PEPCID) 20 MG tablet, Take  1 tablet (20 mg total) by mouth 2 (two) times daily., Disp: 30 tablet, Rfl: 0   FLUoxetine (PROZAC) 40 MG capsule, Take 1 capsule (40 mg total) by mouth daily., Disp: 15 capsule, Rfl: 0   gabapentin (NEURONTIN) 300 MG capsule, Take 2 capsules (600 mg total) by mouth 3 (three) times daily., Disp: 90 capsule, Rfl: 0   levETIRAcetam (KEPPRA) 750 MG tablet, Take 1 tablet (750 mg total) by mouth 2 (two) times daily., Disp: 60 tablet, Rfl: 1   pantoprazole (PROTONIX) 40 MG tablet, Take 1 tablet (40 mg total) by mouth daily., Disp: 15 tablet, Rfl: 0   promethazine (PHENERGAN) 25 MG suppository, Place 1 suppository (25 mg total) rectally every 6 (six) hours as needed for nausea or vomiting., Disp: 12 suppository, Rfl: 0   traZODone (DESYREL) 150 MG tablet, Take 1 tablet (150 mg total) by mouth at bedtime as needed for sleep., Disp: 15 tablet, Rfl: 0   nicotine (NICODERM CQ - DOSED IN MG/24 HOURS) 21 mg/24hr patch, Place 1 patch (21 mg total) onto the skin daily. (Patient not taking: Reported on 06/26/2023), Disp: 14 patch, Rfl: 0  PAST MEDICAL HISTORY: Past Medical History:  Diagnosis Date   Anxiety    Bipolar 2 disorder (HCC)    Depression    Seizures (HCC)     PAST SURGICAL HISTORY: Past Surgical History:  Procedure Laterality Date   HAND SURGERY Right     FAMILY HISTORY: Family History  Problem Relation Age of Onset   Anxiety disorder Mother    Alcohol abuse Father    Anxiety disorder Maternal Aunt    Anxiety disorder Maternal Grandmother    Alcohol abuse Paternal Grandfather     SOCIAL HISTORY: Social History   Socioeconomic History   Marital status: Single    Spouse name: Not on file   Number of children: Not on file   Years of education: Not on file   Highest education level: Not on file  Occupational History   Not on file  Tobacco Use   Smoking status: Every Day    Packs/day: 0.50    Years: 15.00    Additional pack years: 0.00    Total pack years: 7.50    Types:  Cigarettes   Smokeless tobacco: Former    Types: Snuff   Tobacco comments:    Pt states plans to stop drinking. Will deal with tobacco cessation later.  Vaping Use   Vaping Use: Never used  Substance and Sexual Activity   Alcohol use: Never    Alcohol/week: 60.0 standard drinks of alcohol    Types: 60 Cans of beer per week   Drug use: Not Currently   Sexual activity: Not Currently  Other Topics Concern   Not on file  Social History Narrative   Right Handed    No Caffeine Use    Social Determinants of Health  Financial Resource Strain: Not on file  Food Insecurity: Food Insecurity Present (06/09/2023)   Hunger Vital Sign    Worried About Running Out of Food in the Last Year: Sometimes true    Ran Out of Food in the Last Year: Sometimes true  Transportation Needs: Unmet Transportation Needs (06/09/2023)   PRAPARE - Administrator, Civil Service (Medical): Yes    Lack of Transportation (Non-Medical): Yes  Physical Activity: Not on file  Stress: Not on file  Social Connections: Not on file  Intimate Partner Violence: Not At Risk (06/09/2023)   Humiliation, Afraid, Rape, and Kick questionnaire    Fear of Current or Ex-Partner: No    Emotionally Abused: No    Physically Abused: No    Sexually Abused: No       PHYSICAL EXAM  There were no vitals filed for this visit.  There is no height or weight on file to calculate BMI.   General: The patient is well-developed and well-nourished and in no acute distress  HEENT:  Head is Eddy/AT.  Sclera are anicteric.   Neck: No carotid bruits are noted.  The neck is nontender.  Cardiovascular: The heart has a regular rate and rhythm with a normal S1 and S2. There were no murmurs, gallops or rubs.    Skin: Extremities are without rash or  edema.  Musculoskeletal:  Back is nontender  Neurologic Exam  Mental status: The patient is alert and oriented x 3 at the time of the examination. The patient has apparent normal  recent and remote memory, with an apparently normal attention span and concentration ability.   Speech is normal.  Cranial nerves: Extraocular movements are full. Pupils are equal, round, and reactive to light and accomodation.    He reports decreased sensation to temperature on hte left.  He reports less vibration sensation over frontal bone on the left (usually functional)/   Facial strength is normal.  Trapezius and sternocleidomastoid strength is normal. No dysarthria is noted.  The tongue is midline, and the patient has symmetric elevation of the soft palate. No obvious hearing deficits are noted.  Motor:  Muscle bulk is normal.   Tone is normal. Strength is  5 / 5 in all 4 extremities.   Sensory: Sensory testing is intact to pinprick, soft touch and vibration sensation in the arms but reduced sensation to vibration at the ankles and near absent vibration sensation at the toes.  Temp sensation was symmetric and not as affected  Coordination: Cerebellar testing reveals good finger-nose-finger and heel-to-shin bilaterally.  Gait and station: Station is normal.   Gait is normal. Tandem gait is wide for age. Romberg is negative.   Reflexes: Deep tendon reflexes are symmetric and normal bilaterally.   Plantar responses are flexor.    DIAGNOSTIC DATA (LABS, IMAGING, TESTING) - I reviewed patient records, labs, notes, testing and imaging myself where available.  Lab Results  Component Value Date   WBC 5.5 06/24/2023   HGB 14.6 06/24/2023   HCT 41.8 06/24/2023   MCV 93.7 06/24/2023   PLT 270 06/24/2023      Component Value Date/Time   NA 140 06/24/2023 0056   K 3.8 06/24/2023 0056   CL 106 06/24/2023 0056   CO2 23 06/24/2023 0056   GLUCOSE 141 (H) 06/24/2023 0056   BUN 6 06/24/2023 0056   CREATININE 0.78 06/24/2023 0056   CALCIUM 8.7 (L) 06/24/2023 0056   PROT 6.9 06/24/2023 0056   ALBUMIN 3.8 06/24/2023 0056  AST 36 06/24/2023 0056   ALT 38 06/24/2023 0056   ALKPHOS 43  06/24/2023 0056   BILITOT 0.9 06/24/2023 0056   GFRNONAA >60 06/24/2023 0056   GFRAA >60 09/05/2020 1200   Lab Results  Component Value Date   CHOL 199 03/28/2023   HDL 64 03/28/2023   LDLCALC 104 (H) 03/28/2023   TRIG 157 (H) 03/28/2023   CHOLHDL 3.1 03/28/2023   Lab Results  Component Value Date   HGBA1C 5.6 03/28/2023   Lab Results  Component Value Date   VITAMINB12 415 03/25/2023   Lab Results  Component Value Date   TSH 1.641 03/28/2023       ASSESSMENT AND PLAN  Seizure disorder (HCC) - Plan: Vitamin B12, Copper, serum, Vitamin B1, EEG adult, MR BRAIN W WO CONTRAST  Polysubstance abuse (HCC) - Plan: Vitamin B12, Vitamin B1  Polyneuropathy - Plan: Vitamin B12, Copper, serum, Vitamin B1  Gait disturbance - Plan: MR BRAIN W WO CONTRAST  He is a 32 year old man with a history of seizures and polysubstance abuse.  MRI does show some atrophy though unclear if this was congenital or due to the extensive substance/alcohol abuse.  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  Surprisingly, he does not have an EEG and he does not think he has ever had one.   We will check an EEG to see if any epileptiform or lateralizing feature.   If spells persist and EEG is normal we will need to check a 48-72 hour EEG.   Continue Keppra 750 mg po bid and increase to 2250 mg ([phone]) if they persist.   He seems to have a polyneuropathy and we will check some nutritional labs.   He will continue gabapentin  Return to see [place] in 4 months or sooner if there are new or worsening neurologic symptoms.  Thank you for asking me to see Mr. Schettino.     Bastion Bolger A. Epimenio Foot, MD, [facility] 06/26/2023, 1:06 PM Certified in Neurology, Clinical Neurophysiology, Sleep Medicine and Neuroimaging  [facility] [address], [address] [place], [place] [address] [phone]

## 2023-06-26 NOTE — Telephone Encounter (Signed)
[place] medicaid NPR sent to GI [phone] 

## 2023-06-27 LAB — VITAMIN B12: Vitamin B-12: 648 pg/mL (ref 232–1245)

## 2023-06-27 LAB — COPPER, SERUM

## 2023-06-27 LAB — VITAMIN B1

## 2023-06-28 ENCOUNTER — Other Ambulatory Visit (HOSPITAL_COMMUNITY): Payer: Self-pay

## 2023-06-28 DIAGNOSIS — F1910 Other psychoactive substance abuse, uncomplicated: Secondary | ICD-10-CM

## 2023-06-28 HISTORY — DX: Other psychoactive substance abuse, uncomplicated: F19.10

## 2023-06-30 ENCOUNTER — Encounter (HOSPITAL_COMMUNITY): Payer: Self-pay

## 2023-06-30 ENCOUNTER — Other Ambulatory Visit: Payer: Self-pay

## 2023-06-30 ENCOUNTER — Emergency Department (HOSPITAL_COMMUNITY)
Admission: EM | Admit: 2023-06-30 | Discharge: 2023-06-30 | Disposition: A | Discharge status: Home / Self Care | Payer: MEDICAID | Attending: Emergency Medicine | Admitting: Emergency Medicine

## 2023-06-30 DIAGNOSIS — F1010 Alcohol abuse, uncomplicated: Secondary | ICD-10-CM | POA: Diagnosis not present

## 2023-06-30 DIAGNOSIS — G40909 Epilepsy, unspecified, not intractable, without status epilepticus: Secondary | ICD-10-CM | POA: Diagnosis not present

## 2023-06-30 DIAGNOSIS — Y908 Blood alcohol level of 240 mg/100 ml or more: Secondary | ICD-10-CM | POA: Insufficient documentation

## 2023-06-30 DIAGNOSIS — R569 Unspecified convulsions: Secondary | ICD-10-CM | POA: Diagnosis present

## 2023-06-30 DIAGNOSIS — R1033 Periumbilical pain: Secondary | ICD-10-CM | POA: Diagnosis not present

## 2023-06-30 DIAGNOSIS — R451 Restlessness and agitation: Secondary | ICD-10-CM | POA: Diagnosis not present

## 2023-06-30 LAB — CBC WITH DIFFERENTIAL/PLATELET
Abs Immature Granulocytes: 0 10*3/uL (ref 0.00–0.07)
Basophils Absolute: 0 10*3/uL (ref 0.0–0.1)
Basophils Relative: 1 %
Eosinophils Absolute: 0 10*3/uL (ref 0.0–0.5)
Eosinophils Relative: 0 %
HCT: 43.4 % (ref 39.0–52.0)
Hemoglobin: 15.5 g/dL (ref 13.0–17.0)
Immature Granulocytes: 0 %
Lymphocytes Relative: 47 %
Lymphs Abs: 2.6 10*3/uL (ref 0.7–4.0)
MCH: 32.7 pg (ref 26.0–34.0)
MCHC: 35.7 g/dL (ref 30.0–36.0)
MCV: 91.6 fL (ref 80.0–100.0)
Monocytes Absolute: 0.7 10*3/uL (ref 0.1–1.0)
Monocytes Relative: 13 %
Neutro Abs: 2.1 10*3/uL (ref 1.7–7.7)
Neutrophils Relative %: 39 %
Platelets: 278 10*3/uL (ref 150–400)
RBC: 4.74 MIL/uL (ref 4.22–5.81)
RDW: 12 % (ref 11.5–15.5)
WBC: 5.5 10*3/uL (ref 4.0–10.5)
nRBC: 0 % (ref 0.0–0.2)

## 2023-06-30 LAB — COMPREHENSIVE METABOLIC PANEL
ALT: 53 U/L — ABNORMAL HIGH (ref 0–44)
AST: 29 U/L (ref 15–41)
Albumin: 4.3 g/dL (ref 3.5–5.0)
Alkaline Phosphatase: 51 U/L (ref 38–126)
Anion gap: 13 (ref 5–15)
BUN: 8 mg/dL (ref 6–20)
CO2: 23 mmol/L (ref 22–32)
Calcium: 9.3 mg/dL (ref 8.9–10.3)
Chloride: 103 mmol/L (ref 98–111)
Creatinine, Ser: 0.9 mg/dL (ref 0.61–1.24)
GFR, Estimated: >60 mL/min (ref >60)
Glucose, Bld: 91 mg/dL (ref 70–99)
Potassium: 3.5 mmol/L (ref 3.5–5.1)
Sodium: 139 mmol/L (ref 135–145)
Total Bilirubin: 0.5 mg/dL (ref 0.3–1.2)
Total Protein: 7.8 g/dL (ref 6.5–8.1)

## 2023-06-30 LAB — ETHANOL: Alcohol, Ethyl (B): 348 mg/dL (ref <10)

## 2023-06-30 LAB — CBG MONITORING, ED: Glucose-Capillary: 84 mg/dL (ref 70–99)

## 2023-06-30 MED ORDER — CHLORDIAZEPOXIDE HCL 25 MG PO CAPS
25.0000 mg | ORAL_CAPSULE | Freq: Once | ORAL | Status: AC
  Administered 2023-06-30: 25 mg via ORAL
  Filled 2023-06-30: qty 1

## 2023-06-30 MED ORDER — SODIUM CHLORIDE 0.9 % IV BOLUS: 250.0000 mL | Freq: Once | INTRAVENOUS | Status: DC

## 2023-06-30 MED ORDER — ONDANSETRON HCL 4 MG/2ML IJ SOLN
4.0000 mg | Freq: Once | INTRAMUSCULAR | Status: AC
  Administered 2023-06-30: 4 mg via INTRAVENOUS
  Filled 2023-06-30: qty 2

## 2023-06-30 NOTE — ED Provider Notes (Incomplete)
[place] EMERGENCY DEPARTMENT AT [facility] Provider Note   CSN: [id number] Arrival date & time: 06/30/23  2004     History {Add pertinent medical, surgical, social history, OB history to HPI:1} Chief Complaint  Patient presents with  . Seizures    John Vaughan is a 32 y.o. male with a history of seizures who presents to the ED today with multiple seizures for the past 2 weeks. He reports about 10 seizures a day. Patient was seen about 2 weeks ago for seizures as well. He reports that his Keppra dose was recently increased but he is still having seizures despite taking the medication as prescribed.    Home Medications Prior to Admission medications   Medication Sig Start Date End Date Taking? Authorizing Provider  busPIRone (BUSPAR) 15 MG tablet Take 1 tablet (15 mg total) by mouth 3 (three) times daily. 06/11/23  Yes Clapacs, Jackquline Denmark, MD  famotidine (PEPCID) 20 MG tablet Take 1 tablet (20 mg total) by mouth 2 (two) times daily. 06/11/23   Clapacs, Jackquline Denmark, MD  FLUoxetine (PROZAC) 40 MG capsule Take 1 capsule (40 mg total) by mouth daily. 06/11/23   Clapacs, Jackquline Denmark, MD  gabapentin (NEURONTIN) 300 MG capsule Take 2 capsules (600 mg total) by mouth 3 (three) times daily. 06/11/23   Clapacs, Jackquline Denmark, MD  levETIRAcetam (KEPPRA) 750 MG tablet Take 1 tablet (750 mg total) by mouth 2 (two) times daily. 06/16/23 08/15/23  Carroll Sage, PA-C  nicotine (NICODERM CQ - DOSED IN MG/24 HOURS) 21 mg/24hr patch Place 1 patch (21 mg total) onto the skin daily. Patient not taking: Reported on 06/26/2023 06/12/23   Clapacs, Jackquline Denmark, MD  pantoprazole (PROTONIX) 40 MG tablet Take 1 tablet (40 mg total) by mouth daily. 06/11/23   Clapacs, Jackquline Denmark, MD  promethazine (PHENERGAN) 25 MG suppository Place 1 suppository (25 mg total) rectally every 6 (six) hours as needed for nausea or vomiting. 06/24/23   Roxy Horseman, PA-C  traZODone (DESYREL) 150 MG tablet Take 1 tablet (150 mg total) by mouth at  bedtime as needed for sleep. 06/11/23   Clapacs, Jackquline Denmark, MD      Allergies    Patient has no known allergies.    Review of Systems   Review of Systems  Neurological:  Positive for seizures.    Physical Exam Updated Vital Signs BP 126/89   Pulse 84   Temp 97.7 F (36.5 C) (Oral)   Resp (!) 22   SpO2 97%  Physical Exam  ED Results / Procedures / Treatments   Labs (all labs ordered are listed, but only abnormal results are displayed) Labs Reviewed  COMPREHENSIVE METABOLIC PANEL - Abnormal; Notable for the following components:      Result Value   ALT 53 (*)    All other components within normal limits  CBC WITH DIFFERENTIAL/PLATELET  ETHANOL  CBG MONITORING, ED  CBG MONITORING, ED    EKG None  Radiology No results found.  Procedures Procedures  {Document cardiac monitor, telemetry assessment procedure when appropriate:1}  Medications Ordered in ED Medications - No data to display  ED Course/ Medical Decision Making/ A&P   {   Click here for ABCD2, HEART and other calculatorsREFRESH Note before signing :1}                          Medical Decision Making Amount and/or Complexity of Data Reviewed Labs: ordered.   This  patient presents to the ED for concern of ***, this involves an extensive number of treatment options, and is a complaint that carries with it a high risk of complications and morbidity.   Differential diagnosis includes: ***   Co morbidities that complicate the patient evaluation  ***   Additional history obtained:  Additional history obtained from *** External records from outside source obtained and reviewed including ***   Cardiac Monitoring / EKG:  The patient was maintained on a cardiac monitor.  I personally viewed and interpreted the cardiac monitored which showed: *** with a heart rate of *** bpm.   Lab Tests:  I ordered and personally interpreted labs.  The pertinent results include:  ***   Imaging Studies  ordered:  I ordered imaging studies including ***  I independently visualized and interpreted imaging which showed *** I agree with the radiologist interpretation   Consultations Obtained:  I requested consultation with ***,  and discussed lab and imaging findings as well as pertinent plan - they recommend: ***   Problem List / ED Course / Critical interventions / Medication management  *** I ordered medications including:  ***  for ***  Reevaluation of the patient after these medicines showed that the patient {resolved/improved/worsened:23923::"improved"} I have reviewed the patients home medicines and have made adjustments as needed   Social Determinants of Health:  ***   Test / Admission - Considered:  ***   {Document critical care time when appropriate:1} {Document review of labs and clinical decision tools ie heart score, Chads2Vasc2 etc:1}  {Document your independent review of radiology images, and any outside records:1} {Document your discussion with family members, caretakers, and with consultants:1} {Document social determinants of health affecting pt's care:1} {Document your decision making why or why not admission, treatments were needed:1} Final Clinical Impression(s) / ED Diagnoses Final diagnoses:  None    Rx / DC Orders ED Discharge Orders     None

## 2023-06-30 NOTE — ED Notes (Signed)
Pt and family member ambulated from ED. Stating "Im over it and I'm leaving". Provider made aware

## 2023-06-30 NOTE — ED Triage Notes (Signed)
Pt's fiance reports that pt has had multiple seizures today. Pt has history of seizures and takes Keppra, which she says he hasn't missed any doses.

## 2023-06-30 NOTE — ED Notes (Signed)
Tech in to check BG. Family member states he has been "unconscious since the nurses left and he then woke up afraid and ripped off all his leads". Pt is not in a post seizure state at this time, MD aware.

## 2023-06-30 NOTE — ED Notes (Signed)
Pt family member came to nurses desk stating "Can you take out his IV, If not he is stating he's going to rip it out". Informed family this nurse will be in as soon as available

## 2023-06-30 NOTE — ED Provider Notes (Signed)
[place] EMERGENCY DEPARTMENT AT [facility] Provider Note   CSN: [id number] Arrival date & time: 06/30/23  2004     History  Chief Complaint  Patient presents with   Seizures    Landric Gumz is a 32 y.o. male with a history of seizures who presents to the ED today with multiple seizures for the past several weeks. He reports about 10 seizures a day. Per patient's girlfriend, seizures vary between blank stares or convulsions and last anywhere from 15 seconds to 5 minutes. Patient was seen in the ED about 2 weeks ago for seizures as well. He reports that his Keppra dose was recently increased but he is still having seizures despite taking the medication as prescribed. Additionally, he reports associated nausea and vomiting without fever or changes in bowel habits. He is scheduled to get an EEG with his neurologist tomorrow. No other concerns or complaints at this time.    Home Medications Prior to Admission medications   Medication Sig Start Date End Date Taking? Authorizing Provider  busPIRone (BUSPAR) 15 MG tablet Take 1 tablet (15 mg total) by mouth 3 (three) times daily. 06/11/23  Yes Clapacs, Jackquline Denmark, MD  famotidine (PEPCID) 20 MG tablet Take 1 tablet (20 mg total) by mouth 2 (two) times daily. 06/11/23  Yes Clapacs, Jackquline Denmark, MD  FLUoxetine (PROZAC) 40 MG capsule Take 1 capsule (40 mg total) by mouth daily. 06/11/23  Yes Clapacs, Jackquline Denmark, MD  gabapentin (NEURONTIN) 300 MG capsule Take 2 capsules (600 mg total) by mouth 3 (three) times daily. 06/11/23  Yes Clapacs, Jackquline Denmark, MD  levETIRAcetam (KEPPRA) 750 MG tablet Take 1 tablet (750 mg total) by mouth 2 (two) times daily. 06/16/23 08/15/23 Yes Carroll Sage, PA-C  pantoprazole (PROTONIX) 40 MG tablet Take 1 tablet (40 mg total) by mouth daily. 06/11/23  Yes Clapacs, Jackquline Denmark, MD  traZODone (DESYREL) 150 MG tablet Take 1 tablet (150 mg total) by mouth at bedtime as needed for sleep. 06/11/23  Yes Clapacs, Jackquline Denmark, MD   nicotine (NICODERM CQ - DOSED IN MG/24 HOURS) 21 mg/24hr patch Place 1 patch (21 mg total) onto the skin daily. Patient not taking: Reported on 06/26/2023 06/12/23   Clapacs, Jackquline Denmark, MD  promethazine (PHENERGAN) 25 MG suppository Place 1 suppository (25 mg total) rectally every 6 (six) hours as needed for nausea or vomiting. Patient not taking: Reported on 06/30/2023 06/24/23   Roxy Horseman, PA-C      Allergies    Patient has no known allergies.    Review of Systems   Review of Systems  Neurological:  Positive for seizures.    Physical Exam Updated Vital Signs BP 126/89   Pulse 84   Temp 97.7 F (36.5 C) (Oral)   Resp (!) 22   SpO2 97%  Physical Exam Vitals and nursing note reviewed.  Constitutional:      Appearance: Normal appearance. He is ill-appearing.     Comments: Patient laying in the fetal position with his eyes closed   HENT:     Head: Normocephalic and atraumatic.     Mouth/Throat:     Mouth: Mucous membranes are moist.  Eyes:     Conjunctiva/sclera: Conjunctivae normal.     Pupils: Pupils are equal, round, and reactive to light.  Cardiovascular:     Rate and Rhythm: Normal rate and regular rhythm.     Pulses: Normal pulses.  Pulmonary:     Effort: Pulmonary effort is normal.  Breath sounds: Normal breath sounds.  Abdominal:     Palpations: Abdomen is soft.     Tenderness: There is abdominal tenderness.     Comments: Periumbilical tenderness  Skin:    General: Skin is warm and dry.     Findings: No rash.  Neurological:     General: No focal deficit present.     Sensory: No sensory deficit.     Motor: No weakness.  Psychiatric:     Comments: Agitated on exam     ED Results / Procedures / Treatments   Labs (all labs ordered are listed, but only abnormal results are displayed) Labs Reviewed  COMPREHENSIVE METABOLIC PANEL - Abnormal; Notable for the following components:      Result Value   ALT 53 (*)    All other components within normal limits   ETHANOL - Abnormal; Notable for the following components:   Alcohol, Ethyl (B) 348 (*)    All other components within normal limits  CBC WITH DIFFERENTIAL/PLATELET  CBG MONITORING, ED  CBG MONITORING, ED    EKG None  Radiology No results found.  Procedures Procedures: not indicated.   Medications Ordered in ED Medications  ondansetron (ZOFRAN) injection 4 mg (4 mg Intravenous Given 06/30/23 2228)  chlordiazePOXIDE (LIBRIUM) capsule 25 mg (25 mg Oral Given 06/30/23 2311)    ED Course/ Medical Decision Making/ A&P                             Medical Decision Making Amount and/or Complexity of Data Reviewed Labs: ordered.   This patient presents to the ED for concern of multiple seizures, this involves an extensive number of treatment options, and is a complaint that carries with it a high risk of complications and morbidity.   Differential diagnosis includes: epileptic seizures vs started seizures vs alcohol induced seizures vs hypoglycemia vs electrolyte abnormality, etc.   Co morbidities that complicate the patient evaluation  Seizure disorder Alcohol abuse Mood disorder Benzodiazepine dependence Depression    Additional history obtained:  Additional history obtained from records.   Lab Tests:  I ordered and personally interpreted labs.  The pertinent results include:   Ethanol level of 348 CMP, CBC, CBG are all unremarkable - no acute infection, electrolyte abnormality, AKI   Problem List / ED Course / Critical interventions / Medication management  Multiple seizures, acute alcohol intoxication Patient requested medication to prevent seizures. Since patient was acutely intoxicated I ordered Librium for breakthrough seizures due to alcohol abuse prior to discharge   Social Determinants of Health:  Alcohol abuse Benzodiazepine dependence   Test / Admission - Considered:  Patient able to ambulate on his own. He is stable and safe for discharge  home Return precautions provided       Final Clinical Impression(s) / ED Diagnoses Final diagnoses:  Seizure disorder [facility])  Alcohol abuse    Rx / DC Orders ED Discharge Orders     None         Maxwell Marion, PA-C 07/01/23 0024    Wynetta Fines, MD 07/01/23 1451

## 2023-06-30 NOTE — Discharge Instructions (Addendum)
Make sure to take your Keppra as directed and don't skip any doses. As discussed, call your neurologist tomorrow to make an appointment to get reevaluated.  Get help right away if: A seizure does not stop after 5 minutes. You have more than one seizure in a row, and you do not have enough time between the seizures to feel better. A seizure makes it harder to breathe. A seizure is different from other seizures you have had. A seizure makes you unable to speak or use a part of your body. You did not wake up right away after a seizure. You feel sad, and this does not get better.

## 2023-07-01 ENCOUNTER — Encounter: Payer: Self-pay | Admitting: Neurology

## 2023-07-01 ENCOUNTER — Ambulatory Visit: Payer: MEDICAID | Admitting: Neurology

## 2023-07-01 DIAGNOSIS — G40909 Epilepsy, unspecified, not intractable, without status epilepticus: Secondary | ICD-10-CM | POA: Diagnosis not present

## 2023-07-02 ENCOUNTER — Telehealth: Payer: Self-pay | Admitting: *Deleted

## 2023-07-02 ENCOUNTER — Telehealth: Payer: Self-pay

## 2023-07-02 ENCOUNTER — Other Ambulatory Visit: Payer: Self-pay | Admitting: Neurology

## 2023-07-02 DIAGNOSIS — Z87898 Personal history of other specified conditions: Secondary | ICD-10-CM

## 2023-07-02 DIAGNOSIS — R404 Transient alteration of awareness: Secondary | ICD-10-CM

## 2023-07-02 MED ORDER — OXCARBAZEPINE 300 MG PO TABS: ORAL_TABLET | ORAL | 3 refills | Status: DC

## 2023-07-02 MED ORDER — LEVETIRACETAM 500 MG PO TABS: 500.0000 mg | ORAL_TABLET | Freq: Two times a day (BID) | ORAL | 2 refills | Status: DC

## 2023-07-02 NOTE — Telephone Encounter (Signed)
Ok to use VNS slot. Thanks

## 2023-07-02 NOTE — Telephone Encounter (Signed)
Order faxed to [facility] for 72 hr EEG.  Fax confirmation received.

## 2023-07-02 NOTE — Progress Notes (Signed)
   [facility]  EEG (ELECTROENCEPHALOGRAM) REPORT   STUDY DATE: 07/01/2023 PATIENT NAME: John Vaughan DOB: Apr 29, 1991 MRN: [id number]  ORDERING CLINICIAN: Miasia Crabtree A. Epimenio Foot, MD. PhD  TECHNOLOGIST: Marianne Sofia, REEGT TECHNIQUE: Electroencephalogram was recorded utilizing standard 10-20 system of lead placement and reformatted into average and bipolar montages.  RECORDING TIME: 25 minutes 32 seconds  CLINICAL INFORMATION: 32 year old man with spells of altered consciousness and seizure disorder  FINDINGS: A digital EEG was performed while the patient was awake and drowsy. While awake and most alert there was a 9 hz posterior dominant rhythm.  He had some runs of frontal predominant slowing voltages. frequencies were symmetric.  There were no focal, lateralizing, epileptiform or seizures seen.  Photic stimulation had a normal driving response at several frequencies. Hyperventilation led to slowing followed by recovery.   EKG channel shows normal sinus rhythm.  The patient became drowsy and brief sleep was recorded.  According to the technician, the patient was anxious throughout most of the recording and clenching was noted a few times  IMPRESSION: This EEG shows the following: Frontal predominant slowing was noted.  This is nonspecific and is most often seen with cerebral dysfunction No epileptiform activity was noted.   INTERPRETING PHYSICIAN:   Tsuruko Murtha A. Epimenio Foot, MD, PhD, [facility] Certified in Neurology, Clinical Neurophysiology, Sleep Medicine, Pain Medicine and Neuroimaging  [facility] [address], [address] [place], [place] [address] [phone]

## 2023-07-02 NOTE — Telephone Encounter (Signed)
Spoke with patients fiance and advised of appointment with Dr. Teresa Coombs on 07/23/23. She is aware to arrive at 3:15pm for 3:45 appointment time. No other questions at this time.

## 2023-07-02 NOTE — Telephone Encounter (Signed)
I called and spoke to fiance [name],  I relayed message from Dr. Epimenio Foot :   1.   The EEG did not show any seizures.  It does show some slowing which could be related to the mild atrophy he has 2.   Since he is still having the episodes, I am going to add oxcarbazepine and I sent the prescription in 3.   I would like to set up a 72-hour ambulatory EEG  (I think we need a special order form) 4.   I discussed his case with one of my partners who sees more epilepsy.  I would like the patient to follow-up with him (Dr Teresa Coombs) since this is his specialty.  Please see if you can get him on his schedule sometime over the next few weeks. She verbalized undertanding  she did ask about the keppra since increase causing vomiting and diarrhea.  Dr. Epimenio Foot consulted and will decrease from 750mg  po bid to his previous dosing of 500mg  po bid.  I gave her the # to [facility] to be on the look out for set up 72 hr VEEG.  Appt with Dr. Teresa Coombs to be set up once he reviews chart.  She has understanding of plan.  She will call back if any questions.  I will communicate thru mychart relating to keppra dosing.  Dr. Karie Georges nurse to relay about appt.

## 2023-07-03 NOTE — Telephone Encounter (Signed)
Appt made per April Jones, RN.

## 2023-07-16 ENCOUNTER — Other Ambulatory Visit: Payer: MEDICAID

## 2023-07-20 ENCOUNTER — Ambulatory Visit (HOSPITAL_COMMUNITY)
Admission: EM | Admit: 2023-07-20 | Discharge: 2023-07-21 | Disposition: A | Discharge status: Other Acute Inpatient Hospital | Payer: No Payment, Other | Attending: Urology | Admitting: Urology

## 2023-07-20 DIAGNOSIS — Z79899 Other long term (current) drug therapy: Secondary | ICD-10-CM | POA: Insufficient documentation

## 2023-07-20 DIAGNOSIS — F1010 Alcohol abuse, uncomplicated: Secondary | ICD-10-CM | POA: Insufficient documentation

## 2023-07-20 DIAGNOSIS — R45851 Suicidal ideations: Secondary | ICD-10-CM | POA: Insufficient documentation

## 2023-07-20 DIAGNOSIS — Z9152 Personal history of nonsuicidal self-harm: Secondary | ICD-10-CM | POA: Insufficient documentation

## 2023-07-20 DIAGNOSIS — F32A Depression, unspecified: Secondary | ICD-10-CM | POA: Insufficient documentation

## 2023-07-20 DIAGNOSIS — Z91148 Patient's other noncompliance with medication regimen for other reason: Secondary | ICD-10-CM | POA: Insufficient documentation

## 2023-07-20 DIAGNOSIS — F419 Anxiety disorder, unspecified: Secondary | ICD-10-CM | POA: Insufficient documentation

## 2023-07-20 DIAGNOSIS — F1410 Cocaine abuse, uncomplicated: Secondary | ICD-10-CM | POA: Insufficient documentation

## 2023-07-20 DIAGNOSIS — Z9151 Personal history of suicidal behavior: Secondary | ICD-10-CM | POA: Insufficient documentation

## 2023-07-20 DIAGNOSIS — R569 Unspecified convulsions: Secondary | ICD-10-CM | POA: Insufficient documentation

## 2023-07-20 NOTE — ED Triage Notes (Signed)
Pt presents to [facility] voluntarily. Pt reports withdrawing from alcohol and reports depression symptoms. Pt reports shakes, cold sweats, fever as withdraw symtpoms. Pt reports struggling with alcohol and reports daily use. Pt does endorse SI. Pt reports a plan of self harm. Pt denies HI nad AVH at this time. Pt does have a diagnosis of bipolar depression and is currently prescribed zoloft however has not beentaking medications consistently. Pt is urgent.

## 2023-07-20 NOTE — Progress Notes (Signed)
   07/20/23 2340  [facility] Triage Screening (Walk-ins at [facility] only)  How Did You Hear About [place]? Family/Friend  What Is the Reason for Your Visit/Call Today? Pt presents to [facility] voluntarily. Pt reports withdrawing from alcohol and reports depression symptoms. Pt reports shakes, cold sweats, fever as withdraw symtpoms. Pt reports struggling with alcohol and reports daily use. Pt does endorse SI. Pt reports a plan of self harm. Pt denies HI nad AVH at this time. Pt does have a diagnosis of bipolar depression and is currently prescribed zoloft however has not beentaking medications consistently.  Pt is urgent.  How Long Has This Been Causing You Problems? <Week  Have You Recently Had Any Thoughts About Hurting Yourself? Yes  How long ago did you have thoughts about hurting yourself? 5 hours ago  Are You Planning to Commit Suicide/Harm Yourself At This time? Yes  Have you Recently Had Thoughts About Hurting Someone [name]? No  Are You Planning To Harm Someone At This Time? No  Are you currently experiencing any auditory, visual or other hallucinations? No  Have You Used Any Alcohol or Drugs in the Past 24 Hours? Yes  How long ago did you use Drugs or Alcohol? Alcohol within the past  What Did You Use and How Much? 14 beers in past 6 hours.  Clinician description of patient physical appearance/behavior: Pt is fairly groomed and is tearful. Pt was semi cooperative during traige due to stating brain is not working and taking long pauses during triage.  What Do You Feel Would Help You the Most Today? Alcohol or Drug Use Treatment  If access to [facility] was not available, would you have sought care in the Emergency Department? Yes  Determination of Need Urgent (48 hours)  Options For Referral [facility] Urgent Care;Facility-Based Crisis

## 2023-07-21 ENCOUNTER — Encounter (HOSPITAL_COMMUNITY): Payer: Self-pay

## 2023-07-21 ENCOUNTER — Emergency Department (HOSPITAL_COMMUNITY)
Admission: EM | Admit: 2023-07-21 | Discharge: 2023-07-21 | Disposition: A | Discharge status: Home / Self Care | Payer: MEDICAID | Attending: Emergency Medicine | Admitting: Emergency Medicine

## 2023-07-21 ENCOUNTER — Inpatient Hospital Stay (HOSPITAL_COMMUNITY)
Admission: AD | Admit: 2023-07-21 | Discharge: 2023-07-26 | DRG: 885 | Disposition: A | Discharge status: Home / Self Care | Payer: MEDICAID | Source: Intra-hospital | Attending: Psychiatry | Admitting: Psychiatry

## 2023-07-21 ENCOUNTER — Other Ambulatory Visit: Payer: Self-pay

## 2023-07-21 ENCOUNTER — Encounter (HOSPITAL_COMMUNITY): Payer: Self-pay | Admitting: Psychiatry

## 2023-07-21 DIAGNOSIS — G40909 Epilepsy, unspecified, not intractable, without status epilepticus: Secondary | ICD-10-CM | POA: Diagnosis present

## 2023-07-21 DIAGNOSIS — F329 Major depressive disorder, single episode, unspecified: Secondary | ICD-10-CM | POA: Insufficient documentation

## 2023-07-21 DIAGNOSIS — F419 Anxiety disorder, unspecified: Secondary | ICD-10-CM | POA: Diagnosis not present

## 2023-07-21 DIAGNOSIS — F17210 Nicotine dependence, cigarettes, uncomplicated: Secondary | ICD-10-CM | POA: Diagnosis present

## 2023-07-21 DIAGNOSIS — Z9151 Personal history of suicidal behavior: Secondary | ICD-10-CM

## 2023-07-21 DIAGNOSIS — F1914 Other psychoactive substance abuse with psychoactive substance-induced mood disorder: Secondary | ICD-10-CM | POA: Diagnosis not present

## 2023-07-21 DIAGNOSIS — F1010 Alcohol abuse, uncomplicated: Secondary | ICD-10-CM | POA: Diagnosis not present

## 2023-07-21 DIAGNOSIS — F4310 Post-traumatic stress disorder, unspecified: Secondary | ICD-10-CM | POA: Diagnosis present

## 2023-07-21 DIAGNOSIS — F1994 Other psychoactive substance use, unspecified with psychoactive substance-induced mood disorder: Secondary | ICD-10-CM

## 2023-07-21 DIAGNOSIS — F32A Depression, unspecified: Secondary | ICD-10-CM | POA: Diagnosis present

## 2023-07-21 DIAGNOSIS — F411 Generalized anxiety disorder: Secondary | ICD-10-CM | POA: Diagnosis present

## 2023-07-21 DIAGNOSIS — Z811 Family history of alcohol abuse and dependence: Secondary | ICD-10-CM | POA: Diagnosis not present

## 2023-07-21 DIAGNOSIS — G4700 Insomnia, unspecified: Secondary | ICD-10-CM | POA: Diagnosis present

## 2023-07-21 DIAGNOSIS — K219 Gastro-esophageal reflux disease without esophagitis: Secondary | ICD-10-CM | POA: Diagnosis present

## 2023-07-21 DIAGNOSIS — Z818 Family history of other mental and behavioral disorders: Secondary | ICD-10-CM

## 2023-07-21 DIAGNOSIS — Z5982 Transportation insecurity: Secondary | ICD-10-CM

## 2023-07-21 DIAGNOSIS — R45851 Suicidal ideations: Secondary | ICD-10-CM | POA: Diagnosis present

## 2023-07-21 DIAGNOSIS — F314 Bipolar disorder, current episode depressed, severe, without psychotic features: Secondary | ICD-10-CM | POA: Diagnosis not present

## 2023-07-21 DIAGNOSIS — Z560 Unemployment, unspecified: Secondary | ICD-10-CM | POA: Diagnosis not present

## 2023-07-21 DIAGNOSIS — G629 Polyneuropathy, unspecified: Secondary | ICD-10-CM | POA: Diagnosis present

## 2023-07-21 DIAGNOSIS — F319 Bipolar disorder, unspecified: Principal | ICD-10-CM | POA: Diagnosis present

## 2023-07-21 DIAGNOSIS — F1410 Cocaine abuse, uncomplicated: Secondary | ICD-10-CM | POA: Diagnosis not present

## 2023-07-21 DIAGNOSIS — F1011 Alcohol abuse, in remission: Secondary | ICD-10-CM

## 2023-07-21 DIAGNOSIS — F10229 Alcohol dependence with intoxication, unspecified: Secondary | ICD-10-CM | POA: Diagnosis present

## 2023-07-21 DIAGNOSIS — Z79899 Other long term (current) drug therapy: Secondary | ICD-10-CM

## 2023-07-21 DIAGNOSIS — F1020 Alcohol dependence, uncomplicated: Secondary | ICD-10-CM | POA: Diagnosis present

## 2023-07-21 LAB — BASIC METABOLIC PANEL
Anion gap: 11 (ref 5–15)
BUN: <5 mg/dL — ABNORMAL LOW (ref 6–20)
CO2: 22 mmol/L (ref 22–32)
Calcium: 8.8 mg/dL — ABNORMAL LOW (ref 8.9–10.3)
Chloride: 106 mmol/L (ref 98–111)
Creatinine, Ser: 0.62 mg/dL (ref 0.61–1.24)
GFR, Estimated: >60 mL/min (ref >60)
Glucose, Bld: 96 mg/dL (ref 70–99)
Potassium: 3.6 mmol/L (ref 3.5–5.1)
Sodium: 139 mmol/L (ref 135–145)

## 2023-07-21 LAB — CBC
HCT: 38.3 % — ABNORMAL LOW (ref 39.0–52.0)
Hemoglobin: 13.1 g/dL (ref 13.0–17.0)
MCH: 32.7 pg (ref 26.0–34.0)
MCHC: 34.2 g/dL (ref 30.0–36.0)
MCV: 95.5 fL (ref 80.0–100.0)
Platelets: 246 10*3/uL (ref 150–400)
RBC: 4.01 MIL/uL — ABNORMAL LOW (ref 4.22–5.81)
RDW: 12.9 % (ref 11.5–15.5)
WBC: 7.2 10*3/uL (ref 4.0–10.5)
nRBC: 0 % (ref 0.0–0.2)

## 2023-07-21 LAB — ETHANOL: Alcohol, Ethyl (B): 195 mg/dL — ABNORMAL HIGH (ref <10)

## 2023-07-21 LAB — COMPREHENSIVE METABOLIC PANEL
ALT: 36 U/L (ref 0–44)
AST: 36 U/L (ref 15–41)
Albumin: 3.9 g/dL (ref 3.5–5.0)
Alkaline Phosphatase: 44 U/L (ref 38–126)
Anion gap: 11 (ref 5–15)
BUN: <5 mg/dL — ABNORMAL LOW (ref 6–20)
CO2: 20 mmol/L — ABNORMAL LOW (ref 22–32)
Calcium: 8.6 mg/dL — ABNORMAL LOW (ref 8.9–10.3)
Chloride: 99 mmol/L (ref 98–111)
Creatinine, Ser: 0.75 mg/dL (ref 0.61–1.24)
GFR, Estimated: >60 mL/min (ref >60)
Glucose, Bld: 104 mg/dL — ABNORMAL HIGH (ref 70–99)
Potassium: 3.5 mmol/L (ref 3.5–5.1)
Sodium: 130 mmol/L — ABNORMAL LOW (ref 135–145)
Total Bilirubin: 0.4 mg/dL (ref 0.3–1.2)
Total Protein: 6.8 g/dL (ref 6.5–8.1)

## 2023-07-21 LAB — RAPID URINE DRUG SCREEN, HOSP PERFORMED
Amphetamines: NOT DETECTED
Barbiturates: NOT DETECTED
Benzodiazepines: NOT DETECTED
Cocaine: NOT DETECTED
Opiates: NOT DETECTED
Tetrahydrocannabinol: NOT DETECTED

## 2023-07-21 LAB — SALICYLATE LEVEL: Salicylate Lvl: <7 mg/dL — ABNORMAL LOW (ref 7.0–30.0)

## 2023-07-21 LAB — ACETAMINOPHEN LEVEL
Acetaminophen (Tylenol), Serum: 14 ug/mL (ref 10–30)
Acetaminophen (Tylenol), Serum: <10 ug/mL — ABNORMAL LOW (ref 10–30)

## 2023-07-21 MED ORDER — THIAMINE MONONITRATE 100 MG PO TABS
100.0000 mg | ORAL_TABLET | Freq: Every day | ORAL | Status: DC
  Administered 2023-07-21: 100 mg via ORAL
  Filled 2023-07-21: qty 1

## 2023-07-21 MED ORDER — LORAZEPAM 1 MG PO TABS: 0.0000 mg | ORAL_TABLET | Freq: Two times a day (BID) | ORAL | Status: DC

## 2023-07-21 MED ORDER — LEVETIRACETAM 500 MG PO TABS: 500.0000 mg | ORAL_TABLET | Freq: Two times a day (BID) | ORAL | Status: DC

## 2023-07-21 MED ORDER — BUSPIRONE HCL 10 MG PO TABS
15.0000 mg | ORAL_TABLET | Freq: Three times a day (TID) | ORAL | Status: DC
  Administered 2023-07-21: 15 mg via ORAL
  Filled 2023-07-21: qty 2

## 2023-07-21 MED ORDER — FAMOTIDINE 20 MG PO TABS
20.0000 mg | ORAL_TABLET | Freq: Two times a day (BID) | ORAL | Status: DC
  Administered 2023-07-21: 20 mg via ORAL
  Filled 2023-07-21: qty 1

## 2023-07-21 MED ORDER — GABAPENTIN 300 MG PO CAPS
600.0000 mg | ORAL_CAPSULE | Freq: Three times a day (TID) | ORAL | Status: DC
  Administered 2023-07-21 – 2023-07-26 (×14): 600 mg via ORAL
  Filled 2023-07-21 (×19): qty 2

## 2023-07-21 MED ORDER — TRAZODONE HCL 100 MG PO TABS: 150.0000 mg | ORAL_TABLET | Freq: Every evening | ORAL | Status: DC | PRN

## 2023-07-21 MED ORDER — TRAZODONE HCL 150 MG PO TABS
150.0000 mg | ORAL_TABLET | Freq: Every evening | ORAL | Status: DC | PRN
  Administered 2023-07-21 – 2023-07-25 (×5): 150 mg via ORAL
  Filled 2023-07-21 (×5): qty 1

## 2023-07-21 MED ORDER — DIPHENHYDRAMINE HCL 25 MG PO CAPS: 50.0000 mg | ORAL_CAPSULE | Freq: Three times a day (TID) | ORAL | Status: DC | PRN

## 2023-07-21 MED ORDER — VITAMIN B-1 100 MG PO TABS
100.0000 mg | ORAL_TABLET | Freq: Every day | ORAL | Status: DC
  Administered 2023-07-22 – 2023-07-26 (×5): 100 mg via ORAL
  Filled 2023-07-21 (×6): qty 1

## 2023-07-21 MED ORDER — NICOTINE 21 MG/24HR TD PT24
21.0000 mg | MEDICATED_PATCH | Freq: Every day | TRANSDERMAL | Status: DC
  Administered 2023-07-21 – 2023-07-25 (×5): 21 mg via TRANSDERMAL
  Filled 2023-07-21 (×8): qty 1

## 2023-07-21 MED ORDER — OXCARBAZEPINE 300 MG PO TABS: 300.0000 mg | ORAL_TABLET | Freq: Two times a day (BID) | ORAL | Status: DC

## 2023-07-21 MED ORDER — LORAZEPAM 1 MG PO TABS
0.0000 mg | ORAL_TABLET | Freq: Four times a day (QID) | ORAL | Status: DC
  Administered 2023-07-21 – 2023-07-22 (×3): 1 mg via ORAL
  Filled 2023-07-21 (×3): qty 1

## 2023-07-21 MED ORDER — FLUOXETINE HCL 20 MG PO CAPS
40.0000 mg | ORAL_CAPSULE | Freq: Every day | ORAL | Status: DC
  Administered 2023-07-22: 40 mg via ORAL
  Filled 2023-07-21: qty 2

## 2023-07-21 MED ORDER — FLUOXETINE HCL 20 MG PO CAPS
40.0000 mg | ORAL_CAPSULE | Freq: Every day | ORAL | Status: DC
  Administered 2023-07-21: 40 mg via ORAL
  Filled 2023-07-21: qty 4

## 2023-07-21 MED ORDER — ALUM & MAG HYDROXIDE-SIMETH 200-200-20 MG/5ML PO SUSP: 30.0000 mL | ORAL | Status: DC | PRN

## 2023-07-21 MED ORDER — HALOPERIDOL LACTATE 5 MG/ML IJ SOLN: 5.0000 mg | Freq: Three times a day (TID) | INTRAMUSCULAR | Status: DC | PRN

## 2023-07-21 MED ORDER — LORAZEPAM 1 MG PO TABS
0.0000 mg | ORAL_TABLET | Freq: Four times a day (QID) | ORAL | Status: DC
  Administered 2023-07-21: 1 mg via ORAL
  Filled 2023-07-21: qty 1

## 2023-07-21 MED ORDER — LORAZEPAM 1 MG PO TABS: 2.0000 mg | ORAL_TABLET | Freq: Three times a day (TID) | ORAL | Status: DC | PRN

## 2023-07-21 MED ORDER — BUSPIRONE HCL 15 MG PO TABS
15.0000 mg | ORAL_TABLET | Freq: Three times a day (TID) | ORAL | Status: DC
  Administered 2023-07-21 – 2023-07-22 (×2): 15 mg via ORAL
  Filled 2023-07-21: qty 1
  Filled 2023-07-21: qty 3
  Filled 2023-07-21: qty 1

## 2023-07-21 MED ORDER — LEVETIRACETAM 250 MG PO TABS
750.0000 mg | ORAL_TABLET | Freq: Two times a day (BID) | ORAL | Status: DC
  Administered 2023-07-21 – 2023-07-26 (×10): 750 mg via ORAL
  Filled 2023-07-21 (×4): qty 3
  Filled 2023-07-21 (×6): qty 1
  Filled 2023-07-21 (×2): qty 3
  Filled 2023-07-21: qty 1
  Filled 2023-07-21: qty 3
  Filled 2023-07-21: qty 1

## 2023-07-21 MED ORDER — MAGNESIUM HYDROXIDE 400 MG/5ML PO SUSP: 30.0000 mL | Freq: Every day | ORAL | Status: DC | PRN

## 2023-07-21 MED ORDER — DIPHENHYDRAMINE HCL 50 MG/ML IJ SOLN: 50.0000 mg | Freq: Three times a day (TID) | INTRAMUSCULAR | Status: DC | PRN

## 2023-07-21 MED ORDER — PANTOPRAZOLE SODIUM 40 MG PO TBEC
40.0000 mg | DELAYED_RELEASE_TABLET | Freq: Every day | ORAL | Status: DC
  Administered 2023-07-21: 40 mg via ORAL
  Filled 2023-07-21: qty 1

## 2023-07-21 MED ORDER — LEVETIRACETAM 500 MG PO TABS
750.0000 mg | ORAL_TABLET | Freq: Two times a day (BID) | ORAL | Status: DC
  Administered 2023-07-21: 750 mg via ORAL
  Filled 2023-07-21: qty 1

## 2023-07-21 MED ORDER — GABAPENTIN 300 MG PO CAPS
600.0000 mg | ORAL_CAPSULE | Freq: Three times a day (TID) | ORAL | Status: DC
  Administered 2023-07-21: 600 mg via ORAL
  Filled 2023-07-21: qty 2

## 2023-07-21 MED ORDER — ACETAMINOPHEN 325 MG PO TABS
650.0000 mg | ORAL_TABLET | Freq: Four times a day (QID) | ORAL | Status: DC | PRN
  Administered 2023-07-21: 650 mg via ORAL
  Filled 2023-07-21: qty 2

## 2023-07-21 MED ORDER — PANTOPRAZOLE SODIUM 40 MG PO TBEC
40.0000 mg | DELAYED_RELEASE_TABLET | Freq: Every day | ORAL | Status: DC
  Administered 2023-07-22 – 2023-07-26 (×5): 40 mg via ORAL
  Filled 2023-07-21 (×7): qty 1

## 2023-07-21 MED ORDER — LORAZEPAM 2 MG/ML IJ SOLN: 2.0000 mg | Freq: Three times a day (TID) | INTRAMUSCULAR | Status: DC | PRN

## 2023-07-21 MED ORDER — ACETAMINOPHEN 325 MG PO TABS
650.0000 mg | ORAL_TABLET | Freq: Once | ORAL | Status: AC
  Administered 2023-07-21: 650 mg via ORAL
  Filled 2023-07-21: qty 2

## 2023-07-21 MED ORDER — FAMOTIDINE 20 MG PO TABS
20.0000 mg | ORAL_TABLET | Freq: Two times a day (BID) | ORAL | Status: DC
  Administered 2023-07-21 – 2023-07-26 (×10): 20 mg via ORAL
  Filled 2023-07-21 (×13): qty 1

## 2023-07-21 MED ORDER — HALOPERIDOL 5 MG PO TABS: 5.0000 mg | ORAL_TABLET | Freq: Three times a day (TID) | ORAL | Status: DC | PRN

## 2023-07-21 NOTE — Group Note (Unsigned)
Date:  07/21/2023 Time:  5:17 PM  Group Topic/Focus:  Wellness Toolbox:   The focus of this group is to discuss various aspects of wellness, balancing those aspects and exploring ways to increase the ability to experience wellness.  Patients will create a wellness toolbox for use upon discharge.     Participation Level:  {BHH PARTICIPATION BMWUX:32440}  Participation Quality:  {BHH PARTICIPATION QUALITY:22265}  Affect:  {BHH AFFECT:22266}  Cognitive:  {BHH COGNITIVE:22267}  Insight: {BHH Insight2:20797}  Engagement in Group:  {BHH ENGAGEMENT IN NUUVO:53664}  Modes of Intervention:  {BHH MODES OF INTERVENTION:22269}  Additional Comments:  ***  Reymundo Poll 07/21/2023, 5:17 PM

## 2023-07-21 NOTE — ED Notes (Signed)
PT belongings: blue jeans, gray shirt, blk shoe. Bag at nurse desk

## 2023-07-21 NOTE — Tx Team (Signed)
Initial Treatment Plan 07/21/2023 7:04 PM [name] [id number]    PATIENT STRESSORS: Financial difficulties   Health problems   Medication change or noncompliance   Occupational concerns   Substance abuse     PATIENT STRENGTHS: Ability for insight  Average or above average intelligence  Capable of independent living  Communication skills  General fund of knowledge  Motivation for treatment/growth  Physical Health  Supportive family/friends    PATIENT IDENTIFIED PROBLEMS: "Suicidal thoughts"  "Substance abuse"  "Anxiety"  "Depression"               DISCHARGE CRITERIA:  Ability to meet basic life and health needs Adequate post-discharge living arrangements Improved stabilization in mood, thinking, and/or behavior Medical problems require only outpatient monitoring Motivation to continue treatment in a less acute level of care Need for constant or close observation no longer present Reduction of life-threatening or endangering symptoms to within safe limits Safe-care adequate arrangements made Verbal commitment to aftercare and medication compliance Withdrawal symptoms are absent or subacute and managed without 24-hour nursing intervention  PRELIMINARY DISCHARGE PLAN: Attend aftercare/continuing care group Attend PHP/IOP Attend 12-step recovery group Outpatient therapy Return to previous living arrangement  PATIENT/FAMILY INVOLVEMENT: This treatment plan has been presented to and reviewed with the patient, Beni Kubas, and/or family member.  The patient and family have been given the opportunity to ask questions and make suggestions.  Quintella Reichert [place], [place] 07/21/2023, 7:04 PM

## 2023-07-21 NOTE — ED Notes (Signed)
Safe transport arrived to take pt across to [facility] Pt left ambulatory with one bag of personal belongings. Pf verified he only had one bag.

## 2023-07-21 NOTE — ED Notes (Signed)
Report given to TJ RN@[facility]  ED

## 2023-07-21 NOTE — Plan of Care (Signed)
  Problem: Education: Goal: Knowledge of disease or condition will improve Outcome: Progressing Goal: Understanding of discharge needs will improve Outcome: Progressing   Problem: Education: Goal: Ability to make informed decisions regarding treatment will improve Outcome: Progressing  Patient endorses Passive SI denies HI/A/VH and verbally contracts for safety. Endorses alcohol abuse states he would like to get help with the Alcohol. Patient is compliant with medications. Q 15 minutes safety checks ongoing Patient remains safe.

## 2023-07-21 NOTE — ED Triage Notes (Addendum)
Pt. [facility] from [facility] for depression and alcoholism. [facility] sent him due to him having chronic seizures. He states that he was at [facility] because he had a plan to kill himself. His plan was to drink a lot and take a lot of sleeping pills.

## 2023-07-21 NOTE — Progress Notes (Signed)
Patient is 32 yrs old, voluntary, has been patient at [facility] several times.  Stated he is trying to get disability, has attorney to help with disability.  Fiance is trying to help him keep the home.  Mother owns the home but has difficulty paying bills.  Fiance babysits, is a Lawyer.  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  Tobacco, started smoking at 33 yrs old, smokes one pack daily.  Alcohol, once weekly usually.  Saturday 7/27 drank  28 twelve oz beers, drinking since 32 yrs old.  THC denied.  Heroin denied.  Has used cocaine in past since age of 32 yrs old.  Stopped using cocaine at 4 months ago, one gram weekly.   Rated anxiety and hopeless 10, depression 8.  SI comes and goes, contracts for safety.  HI comes and goes, get mad at his family.  Has anger problems.  Denied A/V hallucinations.   Patient stated his mother is about to lose the home.  It is mother's home, she lives there.  Mom spends her monthly social security payment on drugs.  Patient has  57 yr old son who lives with his mother.    Has dental problems, cannot remember his last dental appointment.  Blurred vision, needs glasses.  Hearing comes and goes.  [place] PCP near airport in [place].  In the past year has gone to [facility] ER for stomach issues.    Skin:  Two red marks on R heel, L chin small red area.  R hand  3rd finger, work related cut area.   Skin assessment by Spero Curb and [facility].     High fall risk because of seizure history.  Patient has been very cooperative and pleasant.

## 2023-07-21 NOTE — ED Notes (Signed)
RN s/w Meriam Sprague, RN at [facility] and gave report. RN called safe transport to have patient taken to [facility]

## 2023-07-21 NOTE — BHH Group Notes (Signed)
BHH Group Notes:  (Nursing/MHT/Case Management/Adjunct)  Date:  07/21/2023  Time:  9:51 PM  Type of Therapy:  Wrap Up Group  Participation Level:  Active  Participation Quality:  Attentive  Affect:  Appropriate  Cognitive:  Appropriate  Insight:  Good  Engagement in Group:  Improving  Modes of Intervention:  Activity  Summary of Progress/Problems:Had a good day today  Steven Veazie E Annalie Wenner 07/21/2023, 9:51 PM

## 2023-07-21 NOTE — ED Provider Notes (Signed)
Behavioral Health Urgent Care Medical Screening Exam  Patient Name: John Vaughan MRN: [id number] Date of Evaluation: 07/21/23 Chief Complaint:   Diagnosis:  Final diagnoses:  Alcohol abuse    History of Present illness: John Vaughan is a 32 y.o. male with a history of seizure, depression, anxiety, alcohol abuse, suicidal ideation, and cocaine abuse.  Patient presented voluntarily to [facility] requesting alcohol detox and reporting worsening depressive symptoms as well as suicidal ideation with plan to overdose.  Patient was evaluated face-to-face and his chart was reviewed by this nurse practitioner. Patient reports medical history of seizure as well as alcohol withdrawal seizure. Patient states that he is not compliant with his seizure medication and has had multiple seizures over the past several weeks. Per chart review, patient has been evaluated multiple times in the ED for seizure-like activities; his medication was adjusted by his neurologist (Keppra 500mg /BID and Trileptal 300mg /am and 600mg /QHS), 72 hr EEG, and upcoming appointment with Dr. Teresa Coombs for evaluation of ongoing seizure like activities.   On evaluation today, patient reports history of alcohol abuse; he says he relapsed approximately 1 week ago due to ongoing financial stress. He reports that he drinks two cans of 240z beers daily however over the past 3 days he has been binge drinking and drank aproximately fourteen cans of 24oz beers today.   He says he has been experiencing worsening depression for over 1 month. He endorses depressive symptoms of worthlessness, hopelessness, crying spells, isolation, anhedonia, increased anxiety, poor focus, irritability, poor sleep, poor appetite, and suicidal ideation.  Patient is endorsing active suicidal ideation with plan to overdose on "sleeping pills and alcohol."  He reports history of suicidal attempt by cutting when he was 32 years old.  He denies homicidal ideation,  hallucination, paranoia, and other substance use.  Flowsheet Row ED from 07/21/2023 in [facility] Emergency Department at [facility] ED from 07/20/2023 in [facility] ED from 06/30/2023 in [facility] Emergency Department at [facility]  C-SSRS RISK CATEGORY High Risk Moderate Risk Error: Question 6 not populated       Psychiatric Specialty Exam  Presentation  General Appearance:Disheveled  Eye Contact:Good  Speech:Clear and Coherent  Speech Volume:Normal  Handedness:Right   Mood and Affect  Mood: Depressed  Affect: Congruent   Thought Process  Thought Processes: Coherent  Descriptions of Associations:Intact  Orientation:Full (Time, Place and Person)  Thought Content:WDL  Diagnosis of Schizophrenia or Schizoaffective disorder in past: No   Hallucinations:None none at this time. saw someone in his room this morning.  Ideas of Reference:None  Suicidal Thoughts:Yes, Active With Plan; With Intent With Means to Carry Out; Without Intent; Without Plan  Homicidal Thoughts:No   Sensorium  Memory: Immediate Good; Remote Good; Recent Good  Judgment: Poor  Insight: Good   Executive Functions  Concentration: Good  Attention Span: Fair  Recall: Fair  Fund of Knowledge: Good  Language: Good   Psychomotor Activity  Psychomotor Activity: Normal   Assets  Assets: Communication Skills; Desire for Improvement; Social Support; Physical Health   Sleep  Sleep: Poor  Number of hours:  4   Physical Exam: Physical Exam Vitals and nursing note reviewed.  Constitutional:      General: He is not in acute distress.    Appearance: He is well-developed.  HENT:     Head: Normocephalic and atraumatic.  Eyes:     Conjunctiva/sclera: Conjunctivae normal.  Cardiovascular:     Rate and Rhythm: Normal rate.  Pulmonary:     Effort: Pulmonary effort is normal.     Breath sounds: Normal breath sounds.   Abdominal:     Palpations: Abdomen is soft.  Musculoskeletal:        General: Normal range of motion.     Cervical back: Neck supple.  Skin:    General: Skin is warm and dry.  Neurological:     Mental Status: He is alert and oriented to person, place, and time.    Review of Systems  Constitutional: Negative.   HENT: Negative.    Eyes: Negative.   Respiratory: Negative.    Cardiovascular: Negative.   Gastrointestinal: Negative.   Genitourinary: Negative.   Musculoskeletal: Negative.   Skin: Negative.   Neurological: Negative.   Endo/Heme/Allergies: Negative.   Psychiatric/Behavioral:  Positive for depression, substance abuse and suicidal ideas. The patient is nervous/anxious.    Blood pressure 110/82, pulse 90, temperature 98 F (36.7 C), resp. rate 18, SpO2 98%. There is no height or weight on file to calculate BMI.  Musculoskeletal: Strength & Muscle Tone: within normal limits Gait & Station: normal Patient leans: Right   [facility] MSE Discharge Disposition for Follow up and Recommendations: Based on my evaluation the patient appears to have an emergency medical condition for which I recommend the patient be transferred to the emergency department for further evaluation.    Maricela Bo, NP 07/21/2023, 4:26 AM

## 2023-07-21 NOTE — ED Notes (Signed)
Pt received lunch tray 

## 2023-07-21 NOTE — Progress Notes (Addendum)
Pt was accepted to [facility] TODAY 07/21/2023; Bed Assignment 304-2 PENDING signed voluntary consent faxed to [phone]. Nursing; Heron Nay Brunson,RN informed voluntary consent has been faxed.  Pt meets inpatient criteria per Alona Bene, PMHNP   Attending Physician will be Dr. Phineas Inches, MD   Report can be called to: -Adult unit: [phone]-[place]  Pt can arrive after:BED IS READY  Care Team notified:[facility] Lona Kettle, [place], PMHNP, [place], Dalene Seltzer [address] [place], [place] 07/21/2023 @ 11:17 AM
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
## 2023-07-21 NOTE — ED Notes (Signed)
Safe transport here for transport

## 2023-07-21 NOTE — ED Provider Notes (Signed)
[place] EMERGENCY DEPARTMENT AT [facility] Provider Note   CSN: [id number] Arrival date & time: 07/21/23  0254     History  Chief Complaint  Patient presents with   Depression    John Vaughan is a 32 y.o. male.  62 old male presents to the emergency department for evaluation of depression.  He has also been experiencing suicidal thoughts.  Expresses plan of drinking excessive amounts of alcohol and overdosing on sleeping pills.  He has been on a 3-day bender with his last drink at approximately 2100.  He does have a history of epilepsy, but has been compliant with his seizure medications.  Does have a history of seizures from alcohol withdrawal as well.  He is not presently experiencing any significant tremors.  Denies hallucinations.  Does not own or have access to any firearms.  Patient further denies history of prior suicide attempt.  Transferred to ED for evaluation as he was felt too clinically complex to remain at [facility].  The history is provided by the patient. No language interpreter was used.  Depression       Home Medications Prior to Admission medications   Medication Sig Start Date End Date Taking? Authorizing Provider  busPIRone (BUSPAR) 15 MG tablet Take 1 tablet (15 mg total) by mouth 3 (three) times daily. 06/11/23   Clapacs, Jackquline Denmark, MD  famotidine (PEPCID) 20 MG tablet Take 1 tablet (20 mg total) by mouth 2 (two) times daily. 06/11/23   Clapacs, Jackquline Denmark, MD  FLUoxetine (PROZAC) 40 MG capsule Take 1 capsule (40 mg total) by mouth daily. 06/11/23   Clapacs, Jackquline Denmark, MD  gabapentin (NEURONTIN) 300 MG capsule Take 2 capsules (600 mg total) by mouth 3 (three) times daily. 06/11/23   Clapacs, Jackquline Denmark, MD  levETIRAcetam (KEPPRA) 500 MG tablet Take 1 tablet (500 mg total) by mouth 2 (two) times daily. 07/02/23   Sater, Pearletha Furl, MD  nicotine (NICODERM CQ - DOSED IN MG/24 HOURS) 21 mg/24hr patch Place 1 patch (21 mg total) onto the skin daily. Patient not  taking: Reported on 06/26/2023 06/12/23   Clapacs, Jackquline Denmark, MD  Oxcarbazepine (TRILEPTAL) 300 MG tablet One po qAM and two po qHS 07/02/23   Sater, Pearletha Furl, MD  pantoprazole (PROTONIX) 40 MG tablet Take 1 tablet (40 mg total) by mouth daily. 06/11/23   Clapacs, Jackquline Denmark, MD  promethazine (PHENERGAN) 25 MG suppository Place 1 suppository (25 mg total) rectally every 6 (six) hours as needed for nausea or vomiting. Patient not taking: Reported on 06/30/2023 06/24/23   Roxy Horseman, PA-C  traZODone (DESYREL) 150 MG tablet Take 1 tablet (150 mg total) by mouth at bedtime as needed for sleep. 06/11/23   Clapacs, Jackquline Denmark, MD      Allergies    Patient has no known allergies.    Review of Systems   Review of Systems  Psychiatric/Behavioral:  Positive for depression.   Ten systems reviewed and are negative for acute change, except as noted in the HPI.    Physical Exam Updated Vital Signs BP 104/64   Pulse 79   Temp 97.6 F (36.4 C) (Oral)   Resp 18   SpO2 97%   Physical Exam Vitals and nursing note reviewed.  Constitutional:      General: He is not in acute distress.    Appearance: He is well-developed. He is not diaphoretic.     Comments: Disheveled appearing  HENT:     Head: Normocephalic  and atraumatic.  Eyes:     General: No scleral icterus.    Conjunctiva/sclera: Conjunctivae normal.  Pulmonary:     Effort: Pulmonary effort is normal. No respiratory distress.     Comments: Respirations even and unlabored Musculoskeletal:        General: Normal range of motion.     Cervical back: Normal range of motion.  Skin:    General: Skin is warm and dry.     Coloration: Skin is not pale.     Findings: No erythema or rash.  Neurological:     Mental Status: He is alert and oriented to person, place, and time.     Comments: Ambulatory with steady gait  Psychiatric:        Mood and Affect: Mood is depressed.        Speech: Speech normal.        Behavior: Behavior is cooperative.         Thought Content: Thought content includes suicidal ideation. Thought content does not include homicidal ideation.     ED Results / Procedures / Treatments   Labs (all labs ordered are listed, but only abnormal results are displayed) Labs Reviewed  COMPREHENSIVE METABOLIC PANEL - Abnormal; Notable for the following components:      Result Value   Sodium 130 (*)    CO2 20 (*)    Glucose, Bld 104 (*)    BUN <5 (*)    Calcium 8.6 (*)    All other components within normal limits  ETHANOL - Abnormal; Notable for the following components:   Alcohol, Ethyl (B) 195 (*)    All other components within normal limits  SALICYLATE LEVEL - Abnormal; Notable for the following components:   Salicylate Lvl <7.0 (*)    All other components within normal limits  CBC - Abnormal; Notable for the following components:   RBC 4.01 (*)    HCT 38.3 (*)    All other components within normal limits  ACETAMINOPHEN LEVEL  RAPID URINE DRUG SCREEN, HOSP PERFORMED  ACETAMINOPHEN LEVEL    EKG None  Radiology No results found.  Procedures Procedures    Medications Ordered in ED Medications  LORazepam (ATIVAN) tablet 0-4 mg (has no administration in time range)  LORazepam (ATIVAN) tablet 0-4 mg (has no administration in time range)  thiamine (VITAMIN B1) tablet 100 mg (has no administration in time range)  famotidine (PEPCID) tablet 20 mg (has no administration in time range)  busPIRone (BUSPAR) tablet 15 mg (has no administration in time range)  FLUoxetine (PROZAC) capsule 40 mg (has no administration in time range)  gabapentin (NEURONTIN) capsule 600 mg (has no administration in time range)  pantoprazole (PROTONIX) EC tablet 40 mg (has no administration in time range)  traZODone (DESYREL) tablet 150 mg (has no administration in time range)  levETIRAcetam (KEPPRA) tablet 750 mg (has no administration in time range)    ED Course/ Medical Decision Making/ A&P                             Medical  Decision Making Amount and/or Complexity of Data Reviewed Labs: ordered.  Risk OTC drugs. Prescription drug management.   This patient presents to the ED for concern of depression and SI, this involves an extensive number of treatment options, and is a complaint that carries with it a high risk of complications and morbidity.    Co morbidities that complicate the patient evaluation  Alcohol abuse Epilepsy   Lab Tests:  I Ordered, and personally interpreted labs.  The pertinent results include:  Ethanol 195, APAP 14, CO2 20   Cardiac Monitoring:  The patient was maintained on a cardiac monitor.  I personally viewed and interpreted the cardiac monitored which showed an underlying rhythm of: NSR   Medicines ordered and prescription drug management:  I ordered medication including daily prescribed medication as well as Ativan per CIWA protocol for alcohol withdrawal  Reevaluation of the patient after these medicines showed that the patient stayed the same I have reviewed the patients home medicines and have made adjustments as needed   Consultations Obtained:  I requested consultation with TTS - pending recommendations   Problem List / ED Course:  Presenting for depression with suicidal ideations.  Requesting detox from alcohol.  Does have a history of epilepsy as well as seizures secondary to alcohol withdrawal.  No seizure activity since arriving in the ED.  Labs appear consistent with baseline.  Will trend Tylenol level given minimal elevation. TTS service consulted.  Appreciate their recommendations.   Reevaluation:  After the interventions noted above, I reevaluated the patient and found that they have :stayed the same   Social Determinants of Health:  Driving restrictions given epileptic hx   Dispostion:  Care signed out to oncoming ED provider pending TTS assessment and recommendations.         Final Clinical Impression(s) / ED Diagnoses Final  diagnoses:  Substance induced mood disorder (HCC)  Depression, unspecified depression type    Rx / DC Orders ED Discharge Orders     None         Antony Madura, PA-C 07/21/23 [id number]    Tilden Fossa, MD 07/21/23 0730

## 2023-07-21 NOTE — Plan of Care (Signed)
Nurse discussed anxiety, depression and coping skills with patient.  

## 2023-07-22 ENCOUNTER — Encounter (HOSPITAL_COMMUNITY): Payer: Self-pay

## 2023-07-22 MED ORDER — LORAZEPAM 1 MG PO TABS
1.0000 mg | ORAL_TABLET | Freq: Four times a day (QID) | ORAL | Status: AC | PRN
  Administered 2023-07-22: 1 mg via ORAL
  Filled 2023-07-22: qty 1

## 2023-07-22 MED ORDER — OXCARBAZEPINE 300 MG PO TABS
300.0000 mg | ORAL_TABLET | Freq: Every day | ORAL | Status: DC
  Administered 2023-07-23 – 2023-07-26 (×4): 300 mg via ORAL
  Filled 2023-07-22 (×5): qty 1

## 2023-07-22 MED ORDER — BUSPIRONE HCL 5 MG PO TABS
5.0000 mg | ORAL_TABLET | Freq: Three times a day (TID) | ORAL | Status: DC
  Administered 2023-07-22 – 2023-07-24 (×7): 5 mg via ORAL
  Filled 2023-07-22 (×12): qty 1

## 2023-07-22 MED ORDER — OXCARBAZEPINE 300 MG PO TABS
600.0000 mg | ORAL_TABLET | Freq: Every day | ORAL | Status: DC
  Administered 2023-07-22 – 2023-07-25 (×4): 600 mg via ORAL
  Filled 2023-07-22 (×5): qty 2

## 2023-07-22 MED ORDER — LORAZEPAM 0.5 MG PO TABS
0.5000 mg | ORAL_TABLET | Freq: Two times a day (BID) | ORAL | Status: AC
  Administered 2023-07-22 – 2023-07-24 (×4): 0.5 mg via ORAL
  Filled 2023-07-22 (×4): qty 1

## 2023-07-22 NOTE — Progress Notes (Addendum)
Pt denied SI/HI/AVH this morning. Pt rated his depression a 8/10, anxiety a 8/10, and feelings of hopelessness a 10/10. Pt reports that he slept "good" last night. Pt complains of alcohol detox symptoms including tremors, chills, cravings, and irritability. Pt continues to request Ativan stating, "I need it. I feel terrible and I had a small seizure in the cafeteria, I started staring off." PRN 1mg  Ativan administered per [facility] for CIWA >10. Pt has been interactive on the milieu throughout the day and observed attending groups. Pt has been calm and cooperative throughout the shift. RN provided support and encouragement to patient. Pt given scheduled medications as prescribed. Q15 min checks verified for safety. Patient verbally contracts for safety. Patient compliant with medications and treatment plan. Pt is safe on the unit.   07/22/23 0900  Psych Admission Type (Psych Patients Only)  Admission Status Voluntary  Psychosocial Assessment  Patient Complaints Anxiety;Depression;Substance abuse  Eye Contact Fair  Facial Expression Anxious;Sad  Affect Anxious;Depressed  Speech Logical/coherent  Interaction Assertive  Motor Activity Other (Comment) (WDL)  Appearance/Hygiene Disheveled  Behavior Characteristics Cooperative;Anxious  Mood Depressed;Anxious  Thought Process  Coherency WDL  Content WDL  Delusions None reported or observed  Perception WDL  Hallucination None reported or observed  Judgment Poor  Confusion None  Danger to Self  Current suicidal ideation? Denies  Description of Suicide Plan No plan at this time  Self-Injurious Behavior No self-injurious ideation or behavior indicators observed or expressed   Agreement Not to Harm Self Yes  Description of Agreement Pt verbally contracts for safety  Danger to Others  Danger to Others None reported or observed

## 2023-07-22 NOTE — Group Note (Signed)
Recreation Therapy Group Note   Group Topic:Health and Wellness  Group Date: 07/22/2023 Start Time: 0930 End Time: 1000 Facilitators: Tarig Zimmers-McCall, LRT,CTRS Location: [address] Dayroom   Goal Area(s) Addresses:  Patient will identify positive stress management techniques. Patient will identify benefits of using stress management post d/c.     Group Description: Stress Management. LRT and patients discussed the importance of stress management.  Patients were to complete a worksheet that describing their largest source of stress in detail. Patient then needed to list two stressors they are currently experiencing and then circle the symptoms they experience in response to their stress. LRT also played music as patients completed the worksheet.   Affect/Mood: N/A   Participation Level: Did not attend    Clinical Observations/Individualized Feedback:    Plan: Continue to engage patient in RT group sessions 2-3x/week.   Jonh Mcqueary-McCall, LRT,CTRS 07/22/2023 1:07 PM

## 2023-07-22 NOTE — [facility] Group Notes (Signed)
Adult Psychoeducational Group Note  Date:  07/22/2023 Time:  1:57 PM  Group Topic/Focus:  Managing Feelings:   The focus of this group is to identify what feelings patients have difficulty handling and develop a plan to handle them in a healthier way upon discharge.  Participation Level:  Active  Participation Quality:  Appropriate  Affect:  Appropriate  Cognitive:  Appropriate  Insight: Appropriate  Engagement in Group:  Engaged  Modes of Intervention:  Discussion and Exploration  Additional Comments:  Pt participated in group. Pt participated in group activity of using colors to identify current feelings. Pt described what each color means and the way it affect their feelings.    John Vaughan 07/22/2023, 1:57 PM

## 2023-07-22 NOTE — Plan of Care (Signed)
  Problem: Education: Goal: Emotional status will improve Outcome: Progressing Goal: Mental status will improve Outcome: Progressing   Problem: Activity: Goal: Interest or engagement in activities will improve Outcome: Progressing Goal: Sleeping patterns will improve Outcome: Progressing   Problem: Coping: Goal: Ability to demonstrate self-control will improve Outcome: Progressing   Problem: Education: Goal: Ability to state activities that reduce stress will improve Outcome: Progressing   Problem: Education: Goal: Utilization of techniques to improve thought processes will improve Outcome: Progressing
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
## 2023-07-22 NOTE — H&P (Signed)
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 avoidance of thinking about the events, hypervigilance, jumpiness, and frequent angry outbursts, which is most consistent with a diagnosis of PTSD.  We will continue his home medications for epilepsy, Keppra and Trileptal. We will begin tapering his Buspar as well, as he notes that it has not been helpful for him and that he hasn't been taking it for months. We will also stop his Prozac, as he has not been taking this medication. Will start scheduled Ativan 0.5mg  BID for two days, as patient has been experiencing withdrawal symptoms. Will also continue CIWA with as needed Ativan  protocol. Will continue gabapentin for neuropathy and as needed trazodone for insomnia. Plan to start medication for Bipolar Disorder after withdrawal symptoms are improved, patient was previously on Risperdal and reports good effect. Will consider starting naltrexone for AUD after withdrawal symptoms have resolved.      PLAN: Safety and Monitoring:             -- Voluntary admission to inpatient psychiatric unit for safety, stabilization and treatment             -- Daily contact with patient to assess and evaluate symptoms and progress in treatment             -- Patient's case to be discussed in multi-disciplinary team meeting             -- Observation Level : q15 minute checks             -- Vital signs:  q12 hours             -- Precautions: suicide, elopement, and assault   2. Psychiatric Diagnoses and Treatment:  -- Decrease Buspar to 5mg  PO TID for anxiety -- STOP Prozac 40mg  PO daily for depression  -- Continue gabapentin 600mg  PO TID for neuropathy  -- Continue  trazodone 150mg  PO nightly PRN for insomnia -- Continue CIWA w/ lorazepam PRN protocol --  The risks/benefits/side-effects/alternatives to this medication were discussed in detail with the patient and time was given for questions. The patient consents to medication trial.             -- Encouraged patient to participate in unit milieu and in scheduled group therapies              -- Short Term Goals: Ability to identify changes in lifestyle to reduce recurrence of condition will improve, Ability to verbalize feelings will improve, Ability to disclose and discuss suicidal ideas, Ability to demonstrate self-control will improve, Ability to identify and develop effective coping behaviors will improve, Ability to maintain clinical measurements within normal limits will improve, Compliance with prescribed medications will improve, and Ability to identify triggers associated with substance abuse/mental health issues will improve              -- Long Term Goals: Improvement in symptoms so as ready for discharge                3. Medical Issues Being Addressed:              # Epilepsy  -- Continue Keppra 750mg  PO BID for seizures  -- Hold home oxcarbazepine    4. Routine and other pertinent labs reviewed:  Metabolism / endocrine: BMI: Body mass index is 27.53 kg/m. Prolactin: No results found for: "PROLACTIN" Lipid Panel: Lab Results  Component Value Date   CHOL 199 03/28/2023   TRIG 157 (H) 03/28/2023   HDL 64 03/28/2023   CHOLHDL 3.1 03/28/2023   VLDL 31 03/28/2023   LDLCALC 104 (H) 03/28/2023   LDLCALC 120 (H) 05/14/2022   HbgA1c: Hgb A1c MFr Bld (%)  Date Value  03/28/2023 5.6   TSH: TSH (uIU/mL)  Date Value  03/28/2023 1.641    5. Discharge Planning:              -- Social work and case management to assist with discharge planning and identification of hospital follow-up needs prior to discharge             -- Estimated LOS: 5-7 days             -- Discharge Concerns: Need to establish a safety plan; Medication compliance and effectiveness             -- Discharge Goals: Return home with outpatient referrals for mental health follow-up including medication management/psychotherapy   I certify that inpatient services furnished can reasonably be expected to improve the patient's condition.     Gwyndolyn Kaufman, [place] 7/29/202411:25 AM
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
## 2023-07-22 NOTE — BHH Group Notes (Signed)
Adult Psychoeducational Group Note  Date:  07/22/2023 Time:  10:40 AM  Group Topic/Focus:  Goals Group:   The focus of this group is to help patients establish daily goals to achieve during treatment and discuss how the patient can incorporate goal setting into their daily lives to aide in recovery. Recovery Goals:   The focus of this group is to identify appropriate goals for recovery and establish a plan to achieve them.  Participation Level:  Active  Participation Quality:  Appropriate  Affect:  Appropriate  Cognitive:  Appropriate  Insight: Appropriate  Engagement in Group:  Engaged  Modes of Intervention:  Discussion, Education, and Exploration  Additional Comments:  Pt participated in group. Pt stated his goal is to decrease his thoughts of alcohol and inquire about medications for cravings.    Hubbert Landrigan 07/22/2023, 10:40 AM

## 2023-07-22 NOTE — BH IP Treatment Plan (Signed)
Interdisciplinary Treatment and Diagnostic Plan Update  07/22/2023 Time of Session: 1100am Brecker Feuerborn MRN: [id number]  Principal Diagnosis: Bipolar disorder [facility])  Secondary Diagnoses: Principal Problem:   Bipolar disorder (HCC) Active Problems:   Alcohol use disorder, severe, in controlled environment, dependence (HCC)   PTSD (post-traumatic stress disorder)   Current Medications:  Current Facility-Administered Medications  Medication Dose Route Frequency Provider Last Rate Last Admin   acetaminophen (TYLENOL) tablet 650 mg  650 mg Oral Q6H PRN Motley-Mangrum, Jadeka A, PMHNP   650 mg at 07/21/23 2123   alum & mag hydroxide-simeth (MAALOX/MYLANTA) 200-200-20 MG/5ML suspension 30 mL  30 mL Oral Q4H PRN Motley-Mangrum, Jadeka A, PMHNP       busPIRone (BUSPAR) tablet 5 mg  5 mg Oral TID Lauro Franklin, MD   5 mg at 07/22/23 1207   diphenhydrAMINE (BENADRYL) capsule 50 mg  50 mg Oral TID PRN Motley-Mangrum, Geralynn Ochs A, PMHNP       Or   diphenhydrAMINE (BENADRYL) injection 50 mg  50 mg Intramuscular TID PRN Motley-Mangrum, Jadeka A, PMHNP       famotidine (PEPCID) tablet 20 mg  20 mg Oral BID Motley-Mangrum, Jadeka A, PMHNP   20 mg at 07/22/23 0753   gabapentin (NEURONTIN) capsule 600 mg  600 mg Oral TID Motley-Mangrum, Jadeka A, PMHNP   600 mg at 07/22/23 1207   haloperidol (HALDOL) tablet 5 mg  5 mg Oral TID PRN Motley-Mangrum, Jadeka A, PMHNP       Or   haloperidol lactate (HALDOL) injection 5 mg  5 mg Intramuscular TID PRN Motley-Mangrum, Geralynn Ochs A, PMHNP       levETIRAcetam (KEPPRA) tablet 750 mg  750 mg Oral BID Motley-Mangrum, Jadeka A, PMHNP   750 mg at 07/22/23 0753   LORazepam (ATIVAN) tablet 2 mg  2 mg Oral TID PRN Motley-Mangrum, Geralynn Ochs A, PMHNP       Or   LORazepam (ATIVAN) injection 2 mg  2 mg Intramuscular TID PRN Motley-Mangrum, Jadeka A, PMHNP       LORazepam (ATIVAN) tablet 0.5 mg  0.5 mg Oral BID Lauro Franklin, MD       LORazepam (ATIVAN) tablet  1 mg  1 mg Oral Q6H PRN Massengill, Harrold Donath, MD   1 mg at 07/22/23 1258   magnesium hydroxide (MILK OF MAGNESIA) suspension 30 mL  30 mL Oral Daily PRN Motley-Mangrum, Jadeka A, PMHNP       nicotine (NICODERM CQ - dosed in mg/24 hours) patch 21 mg  21 mg Transdermal Daily Massengill, Nathan, MD   21 mg at 07/22/23 0755   [START ON 07/23/2023] Oxcarbazepine (TRILEPTAL) tablet 300 mg  300 mg Oral Daily Pashayan, Mardelle Matte, MD       Oxcarbazepine (TRILEPTAL) tablet 600 mg  600 mg Oral QHS Pashayan, Mardelle Matte, MD       pantoprazole (PROTONIX) EC tablet 40 mg  40 mg Oral Daily Motley-Mangrum, Jadeka A, PMHNP   40 mg at 07/22/23 4098   thiamine (Vitamin B-1) tablet 100 mg  100 mg Oral Daily Motley-Mangrum, Jadeka A, PMHNP   100 mg at 07/22/23 0754   traZODone (DESYREL) tablet 150 mg  150 mg Oral QHS PRN Motley-Mangrum, Jadeka A, PMHNP   150 mg at 07/21/23 2123   PTA Medications: Medications Prior to Admission  Medication Sig Dispense Refill Last Dose   busPIRone (BUSPAR) 15 MG tablet Take 1 tablet (15 mg total) by mouth 3 (three) times daily. 45 tablet 0    famotidine (  PEPCID) 20 MG tablet Take 1 tablet (20 mg total) by mouth 2 (two) times daily. (Patient not taking: Reported on 07/21/2023) 30 tablet 0    FLUoxetine (PROZAC) 40 MG capsule Take 1 capsule (40 mg total) by mouth daily. 15 capsule 0    gabapentin (NEURONTIN) 300 MG capsule Take 2 capsules (600 mg total) by mouth 3 (three) times daily. 90 capsule 0    levETIRAcetam (KEPPRA) 500 MG tablet Take 1 tablet (500 mg total) by mouth 2 (two) times daily. 60 tablet 2    nicotine (NICODERM CQ - DOSED IN MG/24 HOURS) 21 mg/24hr patch Place 1 patch (21 mg total) onto the skin daily. (Patient not taking: Reported on 06/26/2023) 14 patch 0    Oxcarbazepine (TRILEPTAL) 300 MG tablet One po qAM and two po qHS (Patient taking differently: Take 300 mg by mouth See admin instructions. One po qAM and two po qHS) 90 tablet 3    pantoprazole (PROTONIX) 40 MG tablet  Take 1 tablet (40 mg total) by mouth daily. (Patient not taking: Reported on 07/21/2023) 15 tablet 0    promethazine (PHENERGAN) 25 MG suppository Place 1 suppository (25 mg total) rectally every 6 (six) hours as needed for nausea or vomiting. 12 suppository 0    traZODone (DESYREL) 150 MG tablet Take 1 tablet (150 mg total) by mouth at bedtime as needed for sleep. 15 tablet 0     Patient Stressors: Financial difficulties   Health problems   Medication change or noncompliance   Occupational concerns   Substance abuse    Patient Strengths: Ability for insight  Average or above average intelligence  Capable of independent living  Communication skills  General fund of knowledge  Motivation for treatment/growth  Physical Health  Supportive family/friends   Treatment Modalities: Medication Management, Group therapy, Case management,  1 to 1 session with clinician, Psychoeducation, Recreational therapy.   Physician Treatment Plan for Primary Diagnosis: Bipolar disorder (HCC) Long Term Goal(s):     Short Term Goals:    Medication Management: Evaluate patient's response, side effects, and tolerance of medication regimen.  Therapeutic Interventions: 1 to 1 sessions, Unit Group sessions and Medication administration.  Evaluation of Outcomes: Progressing  Physician Treatment Plan for Secondary Diagnosis: Principal Problem:   Bipolar disorder (HCC) Active Problems:   Alcohol use disorder, severe, in controlled environment, dependence (HCC)   PTSD (post-traumatic stress disorder)  Long Term Goal(s):      Short Term Goals:       Medication Management: Evaluate patient's response, side effects, and tolerance of medication regimen.  Therapeutic Interventions: 1 to 1 sessions, Unit Group sessions and Medication administration.  Evaluation of Outcomes: Progressing   RN Treatment Plan for Primary Diagnosis: Bipolar disorder (HCC) Long Term Goal(s): Knowledge of disease and  therapeutic regimen to maintain health will improve  Short Term Goals: Ability to remain free from injury will improve, Ability to verbalize frustration and anger appropriately will improve, Ability to demonstrate self-control, Ability to participate in decision making will improve, Ability to verbalize feelings will improve, Ability to disclose and discuss suicidal ideas, Ability to identify and develop effective coping behaviors will improve, and Compliance with prescribed medications will improve  Medication Management: RN will administer medications as ordered by provider, will assess and evaluate patient's response and provide education to patient for prescribed medication. RN will report any adverse and/or side effects to prescribing provider.  Therapeutic Interventions: 1 on 1 counseling sessions, Psychoeducation, Medication administration, Evaluate responses to treatment, Monitor vital  signs and CBGs as ordered, Perform/monitor CIWA, COWS, AIMS and Fall Risk screenings as ordered, Perform wound care treatments as ordered.  Evaluation of Outcomes: Progressing   LCSW Treatment Plan for Primary Diagnosis: Bipolar disorder (HCC) Long Term Goal(s): Safe transition to appropriate next level of care at discharge, Engage patient in therapeutic group addressing interpersonal concerns.  Short Term Goals: Engage patient in aftercare planning with referrals and resources, Increase social support, Increase ability to appropriately verbalize feelings, Increase emotional regulation, Facilitate acceptance of mental health diagnosis and concerns, Facilitate patient progression through stages of change regarding substance use diagnoses and concerns, Identify triggers associated with mental health/substance abuse issues, and Increase skills for wellness and recovery  Therapeutic Interventions: Assess for all discharge needs, 1 to 1 time with Social worker, Explore available resources and support systems, Assess  for adequacy in community support network, Educate family and significant other(s) on suicide prevention, Complete Psychosocial Assessment, Interpersonal group therapy.  Evaluation of Outcomes: Progressing   Progress in Treatment: Attending groups: Yes. Participating in groups: Yes. Taking medication as prescribed: Yes. Toleration medication: Yes. Family/Significant other contact made: No, will contact:  will contact later this week Patient understands diagnosis: Yes. Discussing patient identified problems/goals with staff: Yes. Medical problems stabilized or resolved: Yes. Denies suicidal/homicidal ideation: Yes. Issues/concerns per patient self-inventory: No. Other: none reported  New problem(s) identified: No, Describe:  none reported  New Short Term/Long Term Goal(s): coping skills, decrease drinking alcohol  Patient Goals:  medication management, stop drinking. AA/NA meetings  Discharge Plan or Barriers: Patient recently admitted. CSW will continue to follow and assess for appropriate referrals and possible discharge planning.     Reason for Continuation of Hospitalization: Anxiety Medication stabilization Other; describe alcohol dependence   Estimated Length of Stay:3-7 days  Last 3 [place] Suicide Severity Risk Score: Flowsheet Row Ad[facility] (Current[facility] Most recent reading at 07/21/2023  3:55 PM ED from 07/21/2023 in [facility] Most recent reading at 07/21/2023  3:05 AM ED from 07/20/2023 in [facility] Most recent reading at 07/20/2023 11:46 PM  C-SSRS RISK CATEGORY High Risk High Risk Moderate Risk       Last PHQ 2/9 Scores:    04/15/2023   11:15 AM 06/11/2022    9:33 PM 05/29/2022    2:01 PM  Depression screen PHQ 2/9  Decreased Interest 3 2 1   Down, Depressed, Hopeless 0 3 2  PHQ - 2 Score 3 5 3   Altered sleeping 2 3 1    Tired, decreased energy 2 2 1   Change in appetite 3 2 1   Feeling bad or failure about yourself  2 2 2   Trouble concentrating 1 1 2   Moving slowly or fidgety/restless 1 1 0  Suicidal thoughts 0 1 1  PHQ-9 Score 14 17 11   Difficult doing work/chores Somewhat difficult Very difficult Somewhat difficult    Scribe for Treatment Team: Charise Killian 07/22/2023 2:37 PM

## 2023-07-22 NOTE — Group Note (Signed)
Occupational Therapy Group Note   Group Topic:Goal Setting  Group Date: 07/22/2023 Start Time: 1430 End Time: 1505 Facilitators: Ted Mcalpine, OT   Group Description: Group encouraged engagement and participation through discussion focused on goal setting. Group members were introduced to goal-setting using the SMART Goal framework, identifying goals as Specific, Measureable, Acheivable, Relevant, and Time-Bound. Group members took time from group to create their own personal goal reflecting the SMART goal template and shared for review by peers and OT.    Therapeutic Goal(s):  Identify at least one goal that fits the SMART framework    Participation Level: Engaged   Participation Quality: Independent   Behavior: Appropriate   Speech/Thought Process: Relevant   Affect/Mood: Appropriate   Insight: Fair   Judgement: Fair      Modes of Intervention: Education  Patient Response to Interventions:  Attentive   Plan: Continue to engage patient in OT groups 2 - 3x/week.  07/22/2023  Ted Mcalpine, OT  Kerrin Champagne, OT

## 2023-07-23 ENCOUNTER — Ambulatory Visit: Payer: Medicaid Other | Admitting: Neurology

## 2023-07-23 MED ORDER — NICOTINE POLACRILEX 2 MG MT GUM
2.0000 mg | CHEWING_GUM | OROMUCOSAL | Status: DC | PRN
  Administered 2023-07-25: 2 mg via ORAL
  Filled 2023-07-23: qty 1

## 2023-07-23 MED ORDER — ARIPIPRAZOLE 2 MG PO TABS
2.0000 mg | ORAL_TABLET | Freq: Every day | ORAL | Status: DC
  Administered 2023-07-23 – 2023-07-24 (×2): 2 mg via ORAL
  Filled 2023-07-23 (×3): qty 1

## 2023-07-23 NOTE — Plan of Care (Signed)
  Problem: Education: Goal: Emotional status will improve Outcome: Progressing Goal: Mental status will improve Outcome: Progressing   Problem: Activity: Goal: Interest or engagement in activities will improve Outcome: Progressing Goal: Sleeping patterns will improve Outcome: Progressing   Problem: Coping: Goal: Ability to demonstrate self-control will improve Outcome: Progressing   

## 2023-07-23 NOTE — BHH Group Notes (Signed)
Adult Psychoeducational Group Note  Date:  07/23/2023 Time:  11:05 AM  Group Topic/Focus:  Goals Group:   The focus of this group is to help patients establish daily goals to achieve during treatment and discuss how the patient can incorporate goal setting into their daily lives to aide in recovery. Orientation:   The focus of this group is to educate the patient on the purpose and policies of crisis stabilization and provide a format to answer questions about their admission.  The group details unit policies and expectations of patients while admitted.  Participation Level:  Active  Participation Quality:  Appropriate  Affect:  Appropriate  Cognitive:  Appropriate  Insight: Appropriate  Engagement in Group:  Engaged  Modes of Intervention:  Discussion  Additional Comments: Pt attended the goals group and remained appropriate and engaged throughout the duration of the group.   Fara Olden O 07/23/2023, 11:05 AM

## 2023-07-23 NOTE — Progress Notes (Signed)
   07/23/23 2200  Psych Admission Type (Psych Patients Only)  Admission Status Voluntary  Psychosocial Assessment  Patient Complaints None  Eye Contact Fair  Facial Expression Animated  Affect Appropriate to circumstance  Speech Logical/coherent  Interaction Assertive  Motor Activity Slow  Appearance/Hygiene Unremarkable  Behavior Characteristics Cooperative;Appropriate to situation  Mood Pleasant  Thought Process  Coherency WDL  Content WDL  Delusions None reported or observed  Perception WDL  Hallucination None reported or observed  Judgment Poor  Confusion None  Danger to Self  Current suicidal ideation? Denies  Self-Injurious Behavior No self-injurious ideation or behavior indicators observed or expressed   Agreement Not to Harm Self Yes  Description of Agreement Verbal  Danger to Others  Danger to Others None reported or observed

## 2023-07-23 NOTE — [facility] Group Notes (Signed)
Adult Psychoeducational Group Note  Date:  07/23/2023 Time:  9:50 PM  Group Topic/Focus:  Wrap-Up Group:   The focus of this group is to help patients review their daily goal of treatment and discuss progress on daily workbooks.  Participation Level:  Active  Participation Quality:  Appropriate  Affect:  Appropriate  Cognitive:  Appropriate  Insight: Appropriate  Engagement in Group:  Engaged  Modes of Intervention:  Discussion  Additional Comments:  Pt. Attended group. Pt stated day was 8 out of 10, Pt. Stated goal for tomorrow is to work on getting release.  Joselyn Arrow 07/23/2023, 9:50 PM

## 2023-07-23 NOTE — Group Note (Signed)
Recreation Therapy Group Note   Group Topic:Animal Assisted Therapy   Group Date: 07/23/2023 Start Time: 8657 End Time: 1030 Facilitators: Janavia Rottman-McCall, LRT,CTRS Location: [address] Dayroom   Animal-Assisted Activity (AAA) Program Checklist/Progress Notes Patient Eligibility Criteria Checklist & Daily Group note for Rec Tx Intervention  AAA/T Program Assumption of Risk Form signed by Patient/ or Parent Legal Guardian Yes  Patient is free of allergies or severe asthma Yes  Patient reports no fear of animals Yes  Patient reports no history of cruelty to animals Yes  Patient understands his/her participation is voluntary Yes  Patient washes hands before animal contact Yes  Patient washes hands after animal contact Yes   Affect/Mood: Appropriate   Participation Level: Engaged   Participation Quality: Independent   Behavior: Appropriate   Speech/Thought Process: Focused   Insight: Good   Judgement: Good   Modes of Intervention: Teaching laboratory technician   Patient Response to Interventions:  Engaged   Education Outcome:  Acknowledges education   Clinical Observations/Individualized Feedback:  Patient attended session and interacted appropriately with therapy dog and peers. Patient asked appropriate questions about therapy dog and his training. Patient shared stories about their pets at home with group.    Plan: Continue to engage patient in RT group sessions 2-3x/week.   Dhanvi Boesen-McCall, LRT,CTRS  07/23/2023 1:26 PM

## 2023-07-23 NOTE — Plan of Care (Signed)
  Problem: Education: Goal: Knowledge of [facility] Education information/materials will improve Outcome: Progressing Goal: Emotional status will improve Outcome: Progressing Goal: Mental status will improve Outcome: Progressing  Patient is compliant with medications stated that he feels he is not "getting enough ativan for my withdrawal" Prn Trazodone 150 mg given for sleep Patient is in bed sleeping respirations noted No S/S of distress. Q 15 minutes safety checks ongoing. Patient remains safe.
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
## 2023-07-23 NOTE — Progress Notes (Signed)
Pt denied SI/HI/AVH this morning. Pt rated his depression a 5/10, anxiety a 10/10, and feelings of hopelessness a 5/10. Pt reports that he slept "good" last night. Pt has been pleasant, calm, and cooperative throughout the shift. RN provided support and encouragement to patient. Pt given scheduled medications as prescribed. Q15 min checks verified for safety. Patient verbally contracts for safety. Patient compliant with medications and treatment plan. Patient is interacting well on the unit. Pt is safe on the unit.   07/23/23 0900  Psych Admission Type (Psych Patients Only)  Admission Status Voluntary  Psychosocial Assessment  Patient Complaints Anxiety;Depression;Substance abuse  Eye Contact Fair  Facial Expression Anxious  Affect Anxious  Speech Logical/coherent  Interaction Assertive  Motor Activity Fidgety  Appearance/Hygiene Disheveled  Behavior Characteristics Cooperative;Anxious  Mood Anxious;Depressed  Thought Process  Coherency WDL  Content WDL  Delusions None reported or observed  Perception WDL  Hallucination None reported or observed  Judgment Impaired  Confusion None  Danger to Self  Current suicidal ideation? Denies  Description of Suicide Plan No plan  Self-Injurious Behavior No self-injurious ideation or behavior indicators observed or expressed   Agreement Not to Harm Self Yes  Description of Agreement Pt verbally contracts for safety  Danger to Others  Danger to Others None reported or observed

## 2023-07-24 MED ORDER — ARIPIPRAZOLE 5 MG PO TABS
5.0000 mg | ORAL_TABLET | Freq: Every day | ORAL | Status: DC
  Administered 2023-07-25 – 2023-07-26 (×2): 5 mg via ORAL
  Filled 2023-07-24 (×3): qty 1

## 2023-07-24 MED ORDER — BUSPIRONE HCL 10 MG PO TABS
10.0000 mg | ORAL_TABLET | Freq: Three times a day (TID) | ORAL | Status: DC
  Administered 2023-07-24 – 2023-07-26 (×5): 10 mg via ORAL
  Filled 2023-07-24 (×8): qty 1

## 2023-07-24 NOTE — Group Note (Signed)
Recreation Therapy Group Note   Group Topic:Other  Group Date: 07/24/2023 Start Time: 8657 End Time: 1015 Facilitators: Jibril Mcminn-McCall, LRT,CTRS Location: [address] Dayroom   Goal Area(s) Addresses:  Patient will identify positive leisure and recreation activities.  Patient will identify one positive benefit of participation in leisure activities.    Group Description: Music Trivia. Patients were divided in to 2 groups for game play. LRT read song lyrics from songs in the  1990s and 2000s hip hop and R&B from a playing card. Each team takes turns answering the question. If they are unable to answer the question, the other team gets the chance to steal the point. The team with the most points wins the game.    Affect/Mood: Appropriate   Participation Level: Engaged   Participation Quality: Independent   Behavior: Appropriate   Speech/Thought Process: Focused   Insight: Good   Judgement: Good   Modes of Intervention: Competitive Play   Patient Response to Interventions:  Engaged   Education Outcome:  Acknowledges education   Clinical Observations/Individualized Feedback: Pt was engaged and bright during group session. Pt worked well with peers and was focused on answering the questions. Pt was appropriate throughout group session.    Plan: Continue to engage patient in RT group sessions 2-3x/week.   Kolina Kube-McCall, LRT,CTRS 07/24/2023 12:08 PM

## 2023-07-24 NOTE — [facility] Group Notes (Signed)
[facility] Group Notes:  (Nursing/MHT/Case Management/Adjunct)  Date:  07/24/2023  Time:  2000  Type of Therapy:   Narcotics Anonymous Meeting  Participation Level:  Active  Participation Quality:  Appropriate, Attentive, Sharing, and Supportive  Affect:  Appropriate  Cognitive:  Alert  Insight:  Improving  Engagement in Group:  Engaged  Modes of Intervention:  Education and Support  Summary of Progress/Problems:  John Vaughan 07/24/2023, 9:26 PM

## 2023-07-24 NOTE — Progress Notes (Signed)
   07/24/23 1000  Psych Admission Type (Psych Patients Only)  Admission Status Voluntary  Psychosocial Assessment  Patient Complaints None  Eye Contact Fair  Facial Expression Animated  Affect Appropriate to circumstance  Speech Logical/coherent  Interaction Assertive  Motor Activity Other (Comment) (WNL)  Appearance/Hygiene Unremarkable  Behavior Characteristics Cooperative;Appropriate to situation  Mood Pleasant  Thought Process  Coherency WDL  Content WDL  Delusions None reported or observed  Perception WDL  Hallucination None reported or observed  Judgment Limited  Confusion None  Danger to Self  Current suicidal ideation? Denies  Self-Injurious Behavior No self-injurious ideation or behavior indicators observed or expressed   Agreement Not to Harm Self Yes  Description of Agreement verbal  Danger to Others  Danger to Others None reported or observed

## 2023-07-24 NOTE — [facility] Suicide Risk Assessment (Signed)
[facility] INPATIENT:  Family/Significant Other Suicide Prevention Education  Suicide Prevention Education:  Education Completed; Water engineer,  (fiance) has been identified by the patient as the family member/significant other with whom the patient will be residing, and identified as the person(s) who will aid the patient in the event of a mental health crisis (suicidal ideations/suicide attempt).  With written consent from the patient, the family member/significant other has been provided the following suicide prevention education, prior to the and/or following the discharge of the patient.  The suicide prevention education provided includes the following: Suicide risk factors Suicide prevention and interventions National Suicide Hotline telephone number [facility] assessment telephone number [facility] Emergency Assistance 911 [facility] and/or Residential Mobile Crisis Unit telephone number  Request made of family/significant other to: Remove weapons (e.g., guns, rifles, knives), all items previously/currently identified as safety concern.   Remove drugs/medications (over-the-counter, prescriptions, illicit drugs), all items previously/currently identified as a safety concern.  The family member/significant other verbalizes understanding of the suicide prevention education information provided.  The family member/significant other agrees to remove the items of safety concern listed above.  John Vaughan John Vaughan 07/24/2023, 10:40 AM

## 2023-07-24 NOTE — [facility] Group Notes (Signed)
[facility] Group Notes:  (Nursing/MHT/Case Management/Adjunct)  Date:  07/24/2023  Time:  9:39 AM  Type of Therapy:  Group Topic/ Focus: Goals Group: The focus of this group is to help patients establish daily goals to achieve during treatment and discuss how the patient can incorporate goal setting into their daily lives to aide in recovery.   Participation Level:  Active  Participation Quality:  Appropriate  Affect:  Appropriate  Cognitive:  Appropriate  Insight:  Appropriate  Engagement in Group:  Engaged  Modes of Intervention:  Discussion  Summary of Progress/Problems:  Patient attended and participated goals group today. Patient's goal for today is to adjust to his medication and stay positive.   Daneil Dan 07/24/2023, 9:39 AM
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
## 2023-07-24 NOTE — Plan of Care (Signed)
  Problem: Education: Goal: Knowledge of [facility] Education information/materials will improve Outcome: Progressing Goal: Emotional status will improve Outcome: Progressing Goal: Mental status will improve Outcome: Progressing Goal: Verbalization of understanding the information provided will improve Outcome: Progressing   Problem: Activity: Goal: Interest or engagement in activities will improve Outcome: Progressing   

## 2023-07-24 NOTE — [facility] Group Notes (Signed)
Spiritual care group on grief and loss facilitated by Chaplain Dyanne Carrel, [facility]  Group Goal: Support / Education around grief and loss  Members engage in facilitated group support and psycho-social education.  Group Description:  Following introductions and group rules, group members engaged in facilitated group dialogue and support around topic of loss, with particular support around experiences of loss in their lives. Group Identified types of loss (relationships / self / things) and identified patterns, circumstances, and changes that precipitate losses. Reflected on thoughts / feelings around loss, normalized grief responses, and recognized variety in grief experience. Group encouraged individual reflection on safe space and on the coping skills that they are already utilizing.  Group drew on Adlerian / Rogerian and narrative framework  Patient Progress: John Vaughan attended group and actively engaged and participated in group conversation and activities. His comments were on topic and appropriate and demonstrated good insight into the topic.

## 2023-07-25 MED ORDER — NALTREXONE HCL 50 MG PO TABS
25.0000 mg | ORAL_TABLET | Freq: Every day | ORAL | Status: DC
  Administered 2023-07-25: 25 mg via ORAL
  Filled 2023-07-25 (×3): qty 1

## 2023-07-25 NOTE — [facility] Group Notes (Signed)
Adult Psychoeducational Group Note  Date:  07/25/2023 Time:  9:15 PM  Group Topic/Focus:  Wrap-Up Group:   The focus of this group is to help patients review their daily goal of treatment and discuss progress on daily workbooks.  Participation Level:  Active  Participation Quality:  Resistant  Affect:  Flat  Cognitive:  Appropriate  Insight: Limited  Engagement in Group:  Engaged  Modes of Intervention:  Discussion  Additional Comments:   Pt states that he had an issue with his medications making him feel bad and that they went helping him. Pt states his fiance came to visit and he spoke with his care team today about D/C tomorrow. Pt endorses anxiety at 8 and depression at a 2  Leola Fiore A Lorenda Hatchet 07/25/2023, 9:15 PM
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
## 2023-07-25 NOTE — Progress Notes (Signed)
   07/25/23 0842  Psych Admission Type (Psych Patients Only)  Admission Status Voluntary  Psychosocial Assessment  Patient Complaints None  Eye Contact Fair  Facial Expression Animated  Affect Appropriate to circumstance  Speech Logical/coherent  Interaction Assertive  Motor Activity Other (Comment) (WDL)  Appearance/Hygiene Unremarkable  Behavior Characteristics Appropriate to situation  Mood Pleasant  Thought Process  Coherency WDL  Content WDL  Delusions None reported or observed  Perception WDL  Hallucination None reported or observed  Judgment Poor  Confusion None  Danger to Self  Current suicidal ideation? Denies  Agreement Not to Harm Self Yes  Description of Agreement verbal  Danger to Others  Danger to Others None reported or observed

## 2023-07-25 NOTE — Plan of Care (Signed)
°  Problem: Education: Goal: Emotional status will improve Outcome: Progressing Goal: Mental status will improve Outcome: Progressing   Problem: Activity: Goal: Interest or engagement in activities will improve Outcome: Progressing   Problem: Coping: Goal: Ability to verbalize frustrations and anger appropriately will improve Outcome: Progressing   Problem: Safety: Goal: Periods of time without injury will increase Outcome: Progressing

## 2023-07-25 NOTE — Group Note (Signed)
LCSW Group Therapy Note  Group Date: 07/25/2023 Start Time: 1100 End Time: 1145   Type of Therapy and Topic:  Group Therapy - Healthy vs Unhealthy Coping Skills  Participation Level:  Did Not Attend   Description of Group The focus of this group was to determine what unhealthy coping techniques typically are used by group members and what healthy coping techniques would be helpful in coping with various problems. Patients were guided in becoming aware of the differences between healthy and unhealthy coping techniques. Patients were asked to identify 2-3 healthy coping skills they would like to learn to use more effectively.  Therapeutic Goals Patients learned that coping is what human beings do all day long to deal with various situations in their lives Patients defined and discussed healthy vs unhealthy coping techniques Patients identified their preferred coping techniques and identified whether these were healthy or unhealthy Patients determined 2-3 healthy coping skills they would like to become more familiar with and use more often. Patients provided support and ideas to each other      Therapeutic Modalities Cognitive Behavioral Therapy Motivational Interviewing  Starleen Arms, [name] 07/25/2023  12:30 PM

## 2023-07-25 NOTE — Group Note (Signed)
Date:  07/25/2023 Time:  12:08 PM  Group Topic/Focus:  Orientation:   The focus of this group is to educate the patient on the purpose and policies of crisis stabilization and provide a format to answer questions about their admission.  The group details unit policies and expectations of patients while admitted.    Participation Level:  Active  Participation Quality:  Appropriate  Affect:  Appropriate  Cognitive:  Alert  Insight: Appropriate  Engagement in Group:  Engaged  Modes of Intervention:  Discussion  Additional Comments:    Beckie Busing 07/25/2023, 12:08 PM

## 2023-07-25 NOTE — Group Note (Signed)
Date:  07/25/2023 Time:  12:03 PM  Group Topic/Focus:  Goals Group:   The focus of this group is to help patients establish daily goals to achieve during treatment and discuss how the patient can incorporate goal setting into their daily lives to aide in recovery.    Participation Level:  Active  Participation Quality:  Appropriate  Affect:  Appropriate  Cognitive:  Alert  Insight: Appropriate  Engagement in Group:  Engaged  Modes of Intervention:  Discussion  Additional Comments:    Beckie Busing 07/25/2023, 12:03 PM

## 2023-07-25 NOTE — Progress Notes (Signed)
   07/25/23 2841  Precautions / Armbands  Precautions Fall risk;Other (Comment) (q15 min safety checks)  Patient armbands applied: Patient Identification John Vaughan)  [facility] Fall Risk Assessment  Risk Factor Category (scoring not indicated) Nursing clinical judgement (High fall risk)  Age 32  Mental Status 0  Physical Satus 0  Elimination 0  Sensory Impairments 0  Gait or Balance 0  History or falls in past 6 months 0  Mood Stabilizer Medications 0  Benzodiazepines 2  Narcotics 0  Sedatives/Hypnotics 1  Atypical Anti Psychotics 1  Detox Protocol (Alcohol, Narcotics, etc.) 7  Total Score 11  Patient Fall Risk Level High fall risk  Required Bundle Interventions *See Row Information* High fall risk - low and high requirements implemented  Fall intervention(s) refused/Patient educated regarding refusal Nonskid socks;Yellow bracelet  Screening for Fall Injury Risk (To be completed on HIGH fall risk patients) - Assessing Need for Floor Mats  Risk For Fall Injury- Criteria for Floor Mats None identified - No additional interventions needed  Safe Patient Handling Required Documentation-Repositioning Needs  Assist with movement in bed No  Fragile skin with pressure ulcer No  Unresponsive No  Safe Patient Handling Assessment  Ambulates independently Yes, no lift needed  Safety Interventions  Less Restrictive Interventions Active listening;Pain/Anxiety management;Provide reassurance;Reorient patient  Safe Environment  Patient oriented to unit and equipment Yes - Oriented to standard equipment

## 2023-07-25 NOTE — Progress Notes (Signed)
   07/25/23 2115  Psych Admission Type (Psych Patients Only)  Admission Status Voluntary  Psychosocial Assessment  Patient Complaints None  Eye Contact Fair  Facial Expression Animated  Affect Appropriate to circumstance  Speech Logical/coherent  Interaction Assertive  Motor Activity Unsteady (WDL)  Appearance/Hygiene Unremarkable  Behavior Characteristics Cooperative;Appropriate to situation  Mood Pleasant  Thought Process  Coherency WDL  Content WDL  Delusions None reported or observed  Perception WDL  Hallucination None reported or observed  Judgment Impaired  Confusion None  Danger to Self  Current suicidal ideation? Denies  Agreement Not to Harm Self Yes  Description of Agreement verbal  Danger to Others  Danger to Others None reported or observed   Pt is pleasant and cooperative. Pt was offered support and encouragement. Pt was given PRN Trazodone per MAR. Q 15 minute checks were done for safety. Pt attended group and interacts well with peers and staff. Pt has no complaints.Pt receptive to treatment and safety maintained on unit.

## 2023-07-25 NOTE — Progress Notes (Signed)
Pt does not wish to take Naltrexone again and states it makes him feel "Not like myself."

## 2023-07-26 ENCOUNTER — Other Ambulatory Visit: Payer: MEDICAID

## 2023-07-26 DIAGNOSIS — F314 Bipolar disorder, current episode depressed, severe, without psychotic features: Secondary | ICD-10-CM

## 2023-07-26 MED ORDER — GABAPENTIN 300 MG PO CAPS: 600.0000 mg | ORAL_CAPSULE | Freq: Three times a day (TID) | ORAL | 0 refills | Status: DC

## 2023-07-26 MED ORDER — OXCARBAZEPINE 300 MG PO TABS: 300.0000 mg | ORAL_TABLET | Freq: Every morning | ORAL | 0 refills | Status: DC

## 2023-07-26 MED ORDER — LEVETIRACETAM 750 MG PO TABS: 750.0000 mg | ORAL_TABLET | Freq: Two times a day (BID) | ORAL | 0 refills | Status: DC

## 2023-07-26 MED ORDER — OXCARBAZEPINE 600 MG PO TABS: 600.0000 mg | ORAL_TABLET | Freq: Every day | ORAL | 0 refills | Status: DC

## 2023-07-26 MED ORDER — BUSPIRONE HCL 10 MG PO TABS: 10.0000 mg | ORAL_TABLET | Freq: Three times a day (TID) | ORAL | 0 refills | Status: AC

## 2023-07-26 MED ORDER — TRAZODONE HCL 150 MG PO TABS: 150.0000 mg | ORAL_TABLET | Freq: Every evening | ORAL | 0 refills | Status: AC | PRN

## 2023-07-26 MED ORDER — ARIPIPRAZOLE 5 MG PO TABS: 5.0000 mg | ORAL_TABLET | Freq: Every day | ORAL | 0 refills | Status: AC
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
## 2023-07-26 NOTE — Discharge Summary (Addendum)
Physician Discharge Summary Note  Patient:  John Vaughan is an 32 y.o., male MRN:  [id number] DOB:  29-Oct-1991 Patient phone:  [phone] (home)  Patient address:   [address] [place] [address],  Total Time spent with patient: 20 minutes  Date of Admission:  07/21/2023 Date of Discharge: 07-26-2023  Reason for Admission:   John Vaughan. Remmers is a 32 y.o. male with a reported past psychiatric history of Bipolar disorder, PTSD, anxiety, and BPD and a past medical history of epilepsy who was admitted for worsening mood and suicidal ideation with plan to overdose in the setting of alcohol intoxication.    Principal Problem: Bipolar disorder [facility]) Discharge Diagnoses: Principal Problem:   Bipolar disorder (HCC) Active Problems:   Alcohol use disorder, severe, in controlled environment, dependence (HCC)   PTSD (post-traumatic stress disorder)   Past Psychiatric History:  Previous Psych Diagnoses: Bipolar disorder, Anxiety, BPD, PTSD Prior inpatient treatment: 8-9 psychiatric hospitalizations Current/prior outpatient treatment: No current outpatient psychiatrist Prior rehab hx: Has been to rehab once for alcohol use, was not able to afford medication afterwards Psychotherapy hx: none reported History of suicide: Reports 9 prior suicide attempts History of homicide or aggression: no violence reported, patient notes having episodes of anger often Psychiatric medication history: Previously on Risperdal but unable to afford. On prozac previously, recently switched to zoloft, but patient reports not effective. Psychiatric medication compliance history: Inconsistent adherence due to insurance/cost Neuromodulation history: None Current Psychiatrist: Has first appointment with psychiatrist on 8/1 Current therapist: None reported  Past Medical History:  Past Medical History:  Diagnosis Date   Anxiety    Bipolar 2 disorder (HCC)    Depression    Seizures (HCC)      Past Surgical History:  Procedure Laterality Date   HAND SURGERY Right    Family History:  Family History  Problem Relation Age of Onset   Anxiety disorder Mother    Alcohol abuse Father    Anxiety disorder Maternal Aunt    Anxiety disorder Maternal Grandmother    Alcohol abuse Paternal Grandfather    Family Psychiatric  History: See H&P  Social History:  Social History   Substance and Sexual Activity  Alcohol Use Yes   Alcohol/week: 28.0 standard drinks of alcohol   Types: 28 Cans of beer per week   Comment: drunk yesterday 28 cans of 12 oz beer     Social History   Substance and Sexual Activity  Drug Use Yes   Types: Cocaine   Comment: last used cocaine 4 months ago, usually one gram used weekly    Social History   Socioeconomic History   Marital status: Significant Other    Spouse name: Not on file   Number of children: 1   Years of education: 12   Highest education level: Not on file  Occupational History   Not on file  Tobacco Use   Smoking status: Former    Current packs/day: 1.00    Average packs/day: 0.8 packs/day for 30.0 years (22.5 ttl pk-yrs)    Types: Cigarettes    Start date: 07/20/2008   Smokeless tobacco: Former    Types: Snuff    Quit date: 07/21/2023  Vaping Use   Vaping status: Never Used  Substance and Sexual Activity   Alcohol use: Yes    Alcohol/week: 28.0 standard drinks of alcohol    Types: 28 Cans of beer per week    Comment: drunk yesterday 28 cans of 12 oz beer  Drug use: Yes    Types: Cocaine    Comment: last used cocaine 4 months ago, usually one gram used weekly   Sexual activity: Yes  Other Topics Concern   Not on file  Social History Narrative   Right Handed    No Caffeine Use    Social Determinants of Health   Financial Resource Strain: Low Risk  (07/04/2023)   Received from [facility]   Overall Financial Resource Strain (CARDIA)    Difficulty of Paying Living Expenses: Not hard at all  Food Insecurity: No  Food Insecurity (07/21/2023)   Hunger Vital Sign    Worried About Running Out of Food in the Last Year: Never true    Ran Out of Food in the Last Year: Never true  Recent Concern: Food Insecurity - Food Insecurity Present (06/09/2023)   Hunger Vital Sign    Worried About Running Out of Food in the Last Year: Sometimes true    Ran Out of Food in the Last Year: Sometimes true  Transportation Needs: No Transportation Needs (07/21/2023)   PRAPARE - Administrator, Civil Service (Medical): No    Lack of Transportation (Non-Medical): No  Recent Concern: Transportation Needs - Unmet Transportation Needs (06/09/2023)   PRAPARE - Transportation    Lack of Transportation (Medical): Yes    Lack of Transportation (Non-Medical): Yes  Physical Activity: Unknown (07/04/2023)   Received from [facility]   Exercise Vital Sign    Days of Exercise per Week: 0 days    Minutes of Exercise per Session: Not on file  Stress: Stress Concern Present (07/04/2023)   Received from [facility] - Occupational Stress Questionnaire    Feeling of Stress : Very much  Social Connections: Socially Isolated (07/04/2023)   Received from [facility]   Social Network    How would you rate your social network (family, work, friends)?: Little participation, lonely and socially isolated    Hospital Course:   During the patient's hospitalization, patient had extensive initial psychiatric evaluation, and follow-up psychiatric evaluations every day.  Psychiatric diagnoses provided upon initial assessment:  Bipolar disorder (HCC) PTSD Alcohol Use Disorder, severe, in controlled environment, dependence Tobacco Use Disorder  Patient's psychiatric medications were adjusted on admission: -- Decrease Buspar to 5mg  PO TID for anxiety -- STOP Prozac 40mg  PO daily for depression             -- Continue gabapentin 600mg  PO TID for neuropathy             -- Continue trazodone  150mg  PO nightly PRN for insomnia -- Continue CIWA w/ lorazepam PRN protocol  During the hospitalization, other adjustments were made to the patient's psychiatric medication regimen:  -Buspar increased to 10 mg tid -abilify started and titrated to 5 mg every day  -naltrexone started but dc due to nausea, feeling flat   Patient's care was discussed during the interdisciplinary team meeting every day during the hospitalization.  The patient reported nausea due to naltrexone, which was stopped. He otherwise denied having side effects to prescribed psychiatric medication.  Gradually, patient started adjusting to milieu. The patient was evaluated each day by a clinical provider to ascertain response to treatment. Improvement was noted by the patient's report of decreasing symptoms, improved sleep and appetite, affect, medication tolerance, behavior, and participation in unit programming.  Patient was asked each day to complete a self inventory noting mood, mental status, pain, new symptoms, anxiety  and concerns.    Symptoms were reported as significantly decreased or resolved completely by discharge.   On day of discharge, the patient reports that their mood is stable. The patient denied having suicidal thoughts for more than 48 hours prior to discharge.  Patient denies having homicidal thoughts.  Patient denies having auditory hallucinations.  Patient denies any visual hallucinations or other symptoms of psychosis. The patient was motivated to continue taking medication with a goal of continued improvement in mental health.   The patient reports their target psychiatric symptoms of depression and suicidal thoughts, all responded well to the psychiatric medications, and the patient reports overall benefit other psychiatric hospitalization. Supportive psychotherapy was provided to the patient. The patient also participated in regular group therapy while hospitalized. Coping skills, problem solving as  well as relaxation therapies were also part of the unit programming.  Pt states he plans to go to AA tonight, and go for 90 meetings in 90 days. He says he has a pamphlet with locations for AA.   Labs were reviewed with the patient, and abnormal results were discussed with the patient.  The patient is able to verbalize their individual safety plan to this provider.  # It is recommended to the patient to continue psychiatric medications as prescribed, after discharge from the hospital.    # It is recommended to the patient to follow up with your outpatient psychiatric provider and PCP.  # It was discussed with the patient, the impact of alcohol, drugs, tobacco have been there overall psychiatric and medical wellbeing, and total abstinence from substance use was recommended the patient.ed.  # Prescriptions provided or sent directly to preferred pharmacy at discharge. Patient agreeable to plan. Given opportunity to ask questions. Appears to feel comfortable with discharge.    # In the event of worsening symptoms, the patient is instructed to call the crisis hotline, 911 and or go to the nearest ED for appropriate evaluation and treatment of symptoms. To follow-up with primary care provider for other medical issues, concerns and or health care needs  # Patient was discharged to home, with a plan to follow up as noted below.   Physical Findings: AIMS:  , ,  ,  ,    CIWA:  CIWA-Ar Total: 0 COWS:     Aims score zero on my exam. No eps on my exam.   Musculoskeletal: Strength & Muscle Tone: within normal limits Gait & Station: normal Patient leans: N/A   Psychiatric Specialty Exam:  Presentation  General Appearance:  Appropriate for Environment; Casual; Fairly Groomed  Eye Contact: Good  Speech: Normal Rate; Clear and Coherent  Speech Volume: Normal  Handedness: Right   Mood and Affect  Mood: Euthymic; Anxious  Affect: Appropriate; Congruent; Full Range   Thought  Process  Thought Processes: Linear  Descriptions of Associations:Intact  Orientation:Full (Time, Place and Person)  Thought Content:Logical  History of Schizophrenia/Schizoaffective disorder:No  Duration of Psychotic Symptoms:N/A  Hallucinations:Hallucinations: None  Ideas of Reference:None  Suicidal Thoughts:Suicidal Thoughts: No  Homicidal Thoughts:Homicidal Thoughts: No   Sensorium  Memory: Immediate Good; Recent Good; Remote Good  Judgment: Fair  Insight: Fair   Art therapist  Concentration: Good  Attention Span: Good  Recall: Good  Fund of Knowledge: Good  Language: Good   Psychomotor Activity  Psychomotor Activity: Psychomotor Activity: Normal   Assets  Assets: Communication Skills; Desire for Improvement; Financial Resources/Insurance; Housing; Intimacy; Social Support   Sleep  Sleep: Sleep: Fair    Physical Exam: Physical  Exam Vitals reviewed.  Constitutional:      General: He is not in acute distress.    Appearance: He is normal weight. He is not toxic-appearing.  Pulmonary:     Effort: Pulmonary effort is normal. No respiratory distress.  Neurological:     Mental Status: He is alert.     Motor: No weakness.     Gait: Gait normal.  Psychiatric:        Mood and Affect: Mood normal.        Behavior: Behavior normal.        Thought Content: Thought content normal.        Judgment: Judgment normal.    Review of Systems  Constitutional:  Negative for chills and fever.  Cardiovascular:  Negative for chest pain and palpitations.  Neurological:  Negative for dizziness, tingling, tremors and headaches.  Psychiatric/Behavioral:  Positive for substance abuse. Negative for depression, hallucinations, memory loss and suicidal ideas. The patient is not nervous/anxious and does not have insomnia.   All other systems reviewed and are negative.  Blood pressure 109/78, pulse 79, temperature 97.9 F (36.6 C), temperature source  Oral, resp. rate 16, height 6' (1.829 m), weight 92.1 kg, SpO2 100%. Body mass index is 27.53 kg/m.   Social History   Tobacco Use  Smoking Status Former   Current packs/day: 1.00   Average packs/day: 0.8 packs/day for 30.0 years (22.5 ttl pk-yrs)   Types: Cigarettes   Start date: 07/20/2008  Smokeless Tobacco Former   Types: Snuff   Quit date: 07/21/2023   Tobacco Cessation:  A prescription for an FDA-approved tobacco cessation medication provided at discharge   Blood Alcohol level:  Lab Results  Component Value Date   ETH 195 (H) 07/21/2023   ETH 348 (HH) 06/30/2023    Metabolic Disorder Labs:  Lab Results  Component Value Date   HGBA1C 5.6 03/28/2023   MPG 114 03/28/2023   MPG 108.28 05/14/2022   No results found for: "PROLACTIN" Lab Results  Component Value Date   CHOL 199 03/28/2023   TRIG 157 (H) 03/28/2023   HDL 64 03/28/2023   CHOLHDL 3.1 03/28/2023   VLDL 31 03/28/2023   LDLCALC 104 (H) 03/28/2023   LDLCALC 120 (H) 05/14/2022    See Psychiatric Specialty Exam and Suicide Risk Assessment completed by Attending Physician prior to discharge.  Discharge destination:  Home  Is patient on multiple antipsychotic therapies at discharge:  No   Has Patient had three or more failed trials of antipsychotic monotherapy by history:  No  Recommended Plan for Multiple Antipsychotic Therapies: NA  Discharge Instructions     Diet - low sodium heart healthy   Complete by: As directed    Increase activity slowly   Complete by: As directed       Allergies as of 07/26/2023       Reactions   Food Diarrhea, Nausea And Vomiting, Rash   Nuts   Chicken Allergy Hives, Diarrhea   Fruit Extracts Diarrhea        Medication List     STOP taking these medications    famotidine 20 MG tablet Commonly known as: PEPCID   FLUoxetine 40 MG capsule Commonly known as: PROZAC   promethazine 25 MG suppository Commonly known as: PHENERGAN       TAKE these  medications      Indication  ARIPiprazole 5 MG tablet Commonly known as: ABILIFY Take 1 tablet (5 mg total) by mouth daily. Start taking on: July 27, 2023  Indication: MIXED BIPOLAR AFFECTIVE DISORDER   busPIRone 10 MG tablet Commonly known as: BUSPAR Take 1 tablet (10 mg total) by mouth 3 (three) times daily. What changed:  medication strength how much to take  Indication: Anxiety Disorder, Major Depressive Disorder   gabapentin 300 MG capsule Commonly known as: NEURONTIN Take 2 capsules (600 mg total) by mouth 3 (three) times daily.  Indication: Abuse or Misuse of Alcohol, Generalized Anxiety Disorder, Agitation   levETIRAcetam 750 MG tablet Commonly known as: KEPPRA Take 1 tablet (750 mg total) by mouth 2 (two) times daily. What changed:  medication strength how much to take  Indication: Seizure   nicotine 21 mg/24hr patch Commonly known as: NICODERM CQ - dosed in mg/24 hours Place 1 patch (21 mg total) onto the skin daily.  Indication: Nicotine Addiction   Oxcarbazepine 300 MG tablet Commonly known as: TRILEPTAL Take 1 tablet (300 mg total) by mouth in the morning. What changed:  how much to take how to take this when to take this additional instructions  Indication: epilepsy   oxcarbazepine 600 MG tablet Commonly known as: TRILEPTAL Take 1 tablet (600 mg total) by mouth at bedtime. What changed: You were already taking a medication with the same name, and this prescription was added. Make sure you understand how and when to take each.  Indication: epilepsy   pantoprazole 40 MG tablet Commonly known as: PROTONIX Take 1 tablet (40 mg total) by mouth daily.  Indication: Gastroesophageal Reflux Disease   traZODone 150 MG tablet Commonly known as: DESYREL Take 1 tablet (150 mg total) by mouth at bedtime as needed for sleep.  Indication: Trouble Sleeping        Follow-up Information     [facility]. Go to.   Specialty:  Behavioral Health Why: Please go to this provider for an assessment, to obtain therapy and medication management services. For fastest service, please go on Monday through Friday, arrive by 7:00 am. Contact information: [address] [place] [address] [phone]        [facility]. Call.   Why: A referral has been made to this provider for therapy services.  There is an approximately 2 month wait list for services. Contact information: [address] [place], [place] [address]  Phone: [phone]                Follow-up recommendations:    Activity: as tolerated  Diet: heart healthy  Other: -Follow-up with your outpatient psychiatric provider -instructions on appointment date, time, and address (location) are provided to you in discharge paperwork.  -Take your psychiatric medications as prescribed at discharge - instructions are provided to you in the discharge paperwork  -Follow-up with outpatient primary care doctor and other specialists -for management of preventative medicine and chronic medical disease  -If you are prescribed an atypical antipsychotic medication, we recommend that your outpatient psychiatrist follow routine screening for side effects within 3 months of discharge, including monitoring: AIMS scale, height, weight, blood pressure, fasting lipid panel, HbA1c, and fasting blood sugar.   -Recommend total abstinence from alcohol, tobacco, and other illicit drug use at discharge.   -If your psychiatric symptoms recur, worsen, or if you have side effects to your psychiatric medications, call your outpatient psychiatric provider, 911, [phone] or go to the nearest emergency department.  -If suicidal thoughts occur, immediately call your outpatient psychiatric provider, 911, [phone] or go to the nearest emergency department.    Signed: Harrold Donath  Celso Amy, MD 07/26/2023, 9:52 AM  Total Time Spent in Direct Patient Care:  I personally spent  35 minutes on the unit in direct patient care. The direct patient care time included face-to-face time with the patient, reviewing the patient's chart, communicating with other professionals, and coordinating care. Greater than 50% of this time was spent in counseling or coordinating care with the patient regarding goals of hospitalization, psycho-education, and discharge planning needs.   Phineas Inches, MD Psychiatrist

## 2023-07-26 NOTE — Progress Notes (Signed)
   07/26/23 0272  15 Minute Checks  Location Dayroom  Visual Appearance Calm  Behavior Composed  Sleep (Behavioral Health Patients Only)  Calculate sleep? (Click Yes once per 24 hr at 0600 safety check) Yes  Documented sleep last 24 hours 8

## 2023-07-26 NOTE — Progress Notes (Signed)
   07/26/23 0741  Psych Admission Type (Psych Patients Only)  Admission Status Voluntary  Psychosocial Assessment  Patient Complaints None  Eye Contact Fair  Facial Expression Animated  Affect Appropriate to circumstance  Speech Logical/coherent  Interaction Assertive  Motor Activity Other (Comment) (WDL)  Appearance/Hygiene Unremarkable  Behavior Characteristics Cooperative;Appropriate to situation  Mood Pleasant  Thought Process  Coherency WDL  Content WDL  Delusions None reported or observed  Perception WDL  Hallucination None reported or observed  Judgment WDL  Confusion None  Danger to Self  Current suicidal ideation? Denies  Agreement Not to Harm Self Yes  Description of Agreement verbal  Danger to Others  Danger to Others None reported or observed

## 2023-07-26 NOTE — Discharge Instructions (Signed)

## 2023-07-26 NOTE — Progress Notes (Signed)
  [facility] Adult Case Management Discharge Plan :  Will you be returning to the same living situation after discharge:  Yes,  Home with fiance At discharge, do you have transportation home?: Yes,  Fiance [name] Do you have the ability to pay for your medications: Yes,     Release of information consent forms completed and in the chart;  Patient's signature needed at discharge.  Patient to Follow up at:  Follow-up Information     [facility]. Go to.   Specialty: Behavioral Health Why: Please go to this provider for an assessment, to obtain therapy and medication management services. For fastest service, please go on Monday through Friday, arrive by 7:00 am. Contact information: [address] [place] [address] [phone]        [facility]. Call.   Why: A referral has been made to this provider for therapy services.  There is an approximately 2 month wait list for services. Contact information: [address] [place], [place] [address]  Phone: [phone]                Next level of care provider has access to [facility] Link:yes  Safety Planning and Suicide Prevention discussed: Yes,  [name], [phone])     Has patient been referred to the Quitline?: Patient refused referral for treatment  Patient has been referred for addiction treatment: Patient refused referral for treatment.  Starleen Arms, LCSW 07/26/2023, 9:41 AM

## 2023-07-29 ENCOUNTER — Encounter: Payer: Self-pay | Admitting: Neurology

## 2023-07-29 ENCOUNTER — Ambulatory Visit: Payer: MEDICAID | Admitting: Neurology

## 2023-07-29 ENCOUNTER — Encounter: Payer: Self-pay | Admitting: Nurse Practitioner

## 2023-07-29 VITALS — BP 120/73 | HR 84 | Ht 72.0 in | Wt 218.0 lb

## 2023-07-29 DIAGNOSIS — G629 Polyneuropathy, unspecified: Secondary | ICD-10-CM | POA: Diagnosis not present

## 2023-07-29 DIAGNOSIS — R569 Unspecified convulsions: Secondary | ICD-10-CM

## 2023-07-29 DIAGNOSIS — G40909 Epilepsy, unspecified, not intractable, without status epilepticus: Secondary | ICD-10-CM

## 2023-07-29 DIAGNOSIS — R269 Unspecified abnormalities of gait and mobility: Secondary | ICD-10-CM | POA: Diagnosis not present

## 2023-07-29 DIAGNOSIS — F1910 Other psychoactive substance abuse, uncomplicated: Secondary | ICD-10-CM | POA: Diagnosis not present

## 2023-07-29 NOTE — Progress Notes (Signed)
[facility]  PATIENT: John Vaughan DOB: 10-08-1991  REFERRING DOCTOR OR PCP: Toy Cookey, NP SOURCE: Patient, note from multiple emergency room visits, imaging and lab reports, MRI and CT personally reviewed.  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 Pt stopped drinking      INTERVAL HISTORY 07/29/2023:  Patient presented for follow-up, he is accompanied by girlfriend. Last visit was last month with Dr. Epimenio Foot. Since then he had a EEG which which showed right frontal slowing. Trileptal was added.  then he was recently admitted to the hospital for alcohol intoxication and suicidal ideation, discharged on July 28.  She reported being sober for the past past 9 days.  He has plan of discontinuing alcohol.  He does have a psychiatrist but has not joined any AA meeting.  Reports his last seizure was last Saturday, 3 days ago.  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  He is compliant with his Keppra 750 mg twice daily and Trileptal 300/600 mg.  He is also complaining of cognitive impairment . He is pending ambulatory EEG.    HISTORY OF PRESENT ILLNESS Dr. Epimenio Foot:  I had the pleasure seeing patient, John Vaughan, at [facility] for a neurologic consultation regarding his seizures.  He is a 32 year old man who has had seizures since age 32.  His first seizure was a convulsion and he started kicking while watching TV and was unresponsive x many minutes.   A second spell a little bu later was also a convulsion and he hit his nose breaking it.   More recently he has both staring spells and convulsions, often with the convulsion following the  staring spell.  At baseline before a few months ago, he would have 2/month on average.     He has been to the emergency room multiple times in the last 6 months and carries a diagnosis of seizures, pseudoseizures and alcohol abuse.    He has had multiple spells with both staring spells and convulsions in June.  Some spells started with staring spells and then convulsions.    Last week, he had > 20 spells and went to the ED.    He does not have much recall of the day.   His most recent emergency room visit was on 06/18/2023 and he was loaded with Keppra and then placed on an outpatient dose of 750 mg p.o. twice daily.    He has been on Keppra of/on x many years.   No seizure in last 3 day.     He has never had an EEG.  MRI 08/26/2022 shows mild atrophy.    He has a long history of alcohol abuse regularly drinking > 24 beers a day.   He started drinking in his mid teens.   He is currently not drinking.  Seizures occur both with moderate to heavy alcohol use and with withdrawal.     He has had some head trauma but never hospitalized for head injuries.    He never had meningitis or encephalitis.    He reached developmental milestones at the right age according to his mom.   He has a long history of alcohol abuse and also has had  suicide ideation.  He was in a psychiatric hospital in mid June for detox.  He reports numbness in his feet since 2018 or so.     Imaging: I personally reviewed the MRI of the brain from 08/26/2022.  It shows mild generalized cortical atrophy, more than would be expected at his age.  There was also mild cerebellar atrophy.  There were no acute findings.  CT scan of the head 06/24/2023 was unchanged :   REVIEW OF SYSTEMS: Constitutional: No fevers, chills, sweats, or change in appetite Eyes: No visual changes, double vision, eye pain Ear, nose and throat: No hearing loss, ear pain, nasal congestion, sore throat Cardiovascular: No chest pain, palpitations Respiratory:  No  shortness of breath at rest or with exertion.   No wheezes GastrointestinaI: No nausea, vomiting, diarrhea, abdominal pain, fecal incontinence Genitourinary:  No dysuria, urinary retention or frequency.  No nocturia. Musculoskeletal:  No neck pain, back pain Integumentary: No rash, pruritus, skin lesions Neurological: as above Psychiatric: No depression at this time.  No anxiety Endocrine: No palpitations, diaphoresis, change in appetite, change in weigh or increased thirst Hematologic/Lymphatic:  No anemia, purpura, petechiae. Allergic/Immunologic: No itchy/runny eyes, nasal congestion, recent allergic reactions, rashes  ALLERGIES: Allergies  Allergen Reactions   Food Diarrhea, Nausea And Vomiting and Rash    Nuts   Chicken Allergy Hives and Diarrhea   Fruit Extracts Diarrhea    HOME MEDICATIONS:  Current Outpatient Medications:    ARIPiprazole (ABILIFY) 5 MG tablet, Take 1 tablet (5 mg total) by mouth daily., Disp: 30 tablet, Rfl: 0   busPIRone (BUSPAR) 10 MG tablet, Take 1 tablet (10 mg total) by mouth 3 (three) times daily., Disp: 90 tablet, Rfl: 0   gabapentin (NEURONTIN) 300 MG capsule, Take 2 capsules (600 mg total) by mouth 3 (three) times daily., Disp: 180 capsule, Rfl: 0   levETIRAcetam (KEPPRA) 750 MG tablet, Take 1 tablet (750 mg total) by mouth 2 (two) times daily., Disp: 60 tablet, Rfl: 0   Oxcarbazepine (TRILEPTAL) 300 MG tablet, Take 1 tablet (300 mg total) by mouth in the morning., Disp: 30 tablet, Rfl: 0   Oxcarbazepine (TRILEPTAL) 600 MG tablet, Take 1 tablet (600 mg total) by mouth at bedtime., Disp: 30 tablet, Rfl: 0   pantoprazole (PROTONIX) 40 MG tablet, Take 1 tablet (40 mg total) by mouth daily., Disp: 15 tablet, Rfl: 0   traZODone (DESYREL) 150 MG tablet, Take 1 tablet (150 mg total) by mouth at bedtime as needed for sleep., Disp: 30 tablet, Rfl: 0  PAST MEDICAL HISTORY: Past Medical History:  Diagnosis Date   Anxiety    Bipolar 2 disorder (HCC)     Depression    Seizures (HCC)     PAST SURGICAL HISTORY: Past Surgical History:  Procedure Laterality Date   HAND SURGERY Right     FAMILY HISTORY: Family History  Problem Relation Age of Onset   Anxiety disorder Mother    Alcohol abuse Father    Anxiety disorder Maternal Aunt    Anxiety disorder Maternal Grandmother    Alcohol abuse Paternal Grandfather     SOCIAL HISTORY: Social History   Socioeconomic History   Marital status: Significant Other    Spouse name: elliott   Number of children: 1   Years of education: 12   Highest education level: 12th grade  Occupational History   Not on file  Tobacco Use   Smoking status: Every Day    Current packs/day: 1.00    Average packs/day:  0.8 packs/day for 30.0 years (22.5 ttl pk-yrs)    Types: Cigarettes    Start date: 07/20/2008   Smokeless tobacco: Former    Types: Snuff    Quit date: 07/21/2023  Vaping Use   Vaping status: Never Used  Substance and Sexual Activity   Alcohol use: Not Currently    Alcohol/week: 28.0 standard drinks of alcohol    Types: 28 Cans of beer per week    Comment: drunk yesterday 28 cans of 12 oz beer   Drug use: Not Currently    Types: Cocaine    Comment: last used cocaine 4 months ago, usually one gram used weekly   Sexual activity: Yes    Birth control/protection: I.U.D.  Other Topics Concern   Not on file  Social History Narrative   Right Handed    No Caffeine Use    Social Determinants of Health   Financial Resource Strain: Low Risk  (07/04/2023)   [facility]   Overall Financial Resource Strain (CARDIA)    Difficulty of Paying Living Expenses: Not hard at all  Food Insecurity: No Food Insecurity (07/21/2023)   Hunger Vital Sign    Worried About Running Out of Food in the Last Year: Never true    Ran Out of Food in the Last Year: Never true  Recent Concern: Food Insecurity - Food Insecurity Present (06/09/2023)   Hunger Vital Sign    Worried About Running Out of  Food in the Last Year: Sometimes true    Ran Out of Food in the Last Year: Sometimes true  Transportation Needs: No Transportation Needs (07/21/2023)   PRAPARE - Administrator, Civil Service (Medical): No    Lack of Transportation (Non-Medical): No  Recent Concern: Transportation Needs - Unmet Transportation Needs (06/09/2023)   PRAPARE - Transportation    Lack of Transportation (Medical): Yes    Lack of Transportation (Non-Medical): Yes  Physical Activity: Unknown (07/04/2023)   Received from [facility]   Exercise Vital Sign    Days of Exercise per Week: 0 days    Minutes of Exercise per Session: Not on file  Stress: Stress Concern Present (07/04/2023)   Received from [facility] - Occupational Stress Questionnaire    Feeling of Stress : Very much  Social Connections: Socially Isolated (07/04/2023)   Received from [facility]   Social Network    How would you rate your social network (family, work, friends)?: Little participation, lonely and socially isolated  Intimate Partner Violence: Not At Risk (07/21/2023)   Humiliation, Afraid, Rape, and Kick questionnaire    Fear of Current or Ex-Partner: No    Emotionally Abused: No    Physically Abused: No    Sexually Abused: No       PHYSICAL EXAM  Vitals:   07/29/23 1335  BP: 120/73  Pulse: 84  Weight: 218 lb (98.9 kg)  Height: 6' (1.829 m)    Body mass index is 29.57 kg/m.   General: The patient is well-developed and well-nourished and in no acute distress  HEENT:  Head is [place]/AT.  Sclera are anicteric.   Neck: No carotid bruits are noted.  The neck is nontender.  Cardiovascular: The heart has a regular rate and rhythm with a normal S1 and S2. There were no murmurs, gallops or rubs.    Skin: Extremities are without rash or  edema.  Musculoskeletal:  Back is nontender  Neurologic Exam  Mental status:  The patient is alert and oriented x 3 at the time of the  examination. The patient has apparent normal recent and remote memory, with an apparently normal attention span and concentration ability.   Speech is normal.  Cranial nerves: Extraocular movements are full. Pupils are equal, round, and reactive to light and accomodation.    He reports decreased sensation to temperature on hte left.  He reports less vibration sensation over frontal bone on the left (usually functional)/   Facial strength is normal.  Trapezius and sternocleidomastoid strength is normal. No dysarthria is noted.  The tongue is midline, and the patient has symmetric elevation of the soft palate. No obvious hearing deficits are noted.  Motor:  Muscle bulk is normal.   Tone is normal. Strength is  5 / 5 in all 4 extremities.   Sensory: Sensory testing is intact to pinprick, soft touch and vibration sensation in the arms but reduced sensation to vibration at the ankles and near absent vibration sensation at the toes.  Temp sensation was symmetric and not as affected  Coordination: Cerebellar testing reveals good finger-nose-finger and heel-to-shin bilaterally.  Gait and station: Station is normal.   Gait is normal. Tandem gait is wide for age. Romberg is negative.   Reflexes: Deep tendon reflexes are symmetric and normal bilaterally.   Plantar responses are flexor.    DIAGNOSTIC DATA (LABS, IMAGING, TESTING) - I reviewed patient records, labs, notes, testing and imaging myself where available.  Lab Results  Component Value Date   WBC 7.2 07/21/2023   HGB 13.1 07/21/2023   HCT 38.3 (L) 07/21/2023   MCV 95.5 07/21/2023   PLT 246 07/21/2023      Component Value Date/Time   NA 139 07/26/2023 0622   K 3.9 07/26/2023 0622   CL 103 07/26/2023 0622   CO2 26 07/26/2023 0622   GLUCOSE 93 07/26/2023 0622   BUN 14 07/26/2023 0622   CREATININE 0.91 07/26/2023 0622   CALCIUM 9.4 07/26/2023 0622   PROT 7.4 07/26/2023 0622   ALBUMIN 4.1 07/26/2023 0622   AST 22 07/26/2023 0622   ALT  27 07/26/2023 0622   ALKPHOS 42 07/26/2023 0622   BILITOT 0.3 07/26/2023 0622   GFRNONAA >60 07/26/2023 0622   GFRAA >60 09/05/2020 1200   Lab Results  Component Value Date   CHOL 199 03/28/2023   HDL 64 03/28/2023   LDLCALC 104 (H) 03/28/2023   TRIG 157 (H) 03/28/2023   CHOLHDL 3.1 03/28/2023   Lab Results  Component Value Date   HGBA1C 5.6 03/28/2023   Lab Results  Component Value Date   VITAMINB12 648 06/26/2023   Lab Results  Component Value Date   TSH 1.641 03/28/2023       ASSESSMENT AND PLAN  Seizure disorder (HCC)  Polysubstance abuse (HCC)  Polyneuropathy  Gait disturbance  Nonepileptic episode (HCC)  He is a 32 year old man with a history of seizures and polysubstance abuse.  MRI does show some atrophy though unclear if this was congenital or due to the extensive substance/alcohol abuse.  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  Most recent EEG with right frontal slowing, plan to obtain 72 hours ambulatory EEG. Plan for now is to continue current medication, Keppra 750 mg twice daily, Trileptal 300/600.  I will contact him to go over the results. Employment letter provided to patient, follow up in 6 months.    Patient Instructions  Continue with Keppra 750 mg twice daily  Continue with Trileptal 300/600 mg. Continue with alcohol abstention  Letter provided to patient  3 days ambulatory EEG  Follow up with your Brain MRI  Return in 6 months or sooner if worse       Windell Norfolk, MD 07/29/2023, 3:28 PM  [facility] [address] [place], [place] [address] [phone]

## 2023-07-29 NOTE — Patient Instructions (Addendum)
Continue with Keppra 750 mg twice daily  Continue with Trileptal 300/600 mg. Continue with alcohol abstention  Letter provided to patient  3 days ambulatory EEG  Follow up with your Brain MRI  Return in 6 months or sooner if worse

## 2023-07-30 ENCOUNTER — Ambulatory Visit
Admission: RE | Admit: 2023-07-30 | Discharge: 2023-07-30 | Disposition: A | Discharge status: Home / Self Care | Payer: MEDICAID | Source: Ambulatory Visit | Attending: Neurology | Admitting: Neurology

## 2023-07-30 DIAGNOSIS — R269 Unspecified abnormalities of gait and mobility: Secondary | ICD-10-CM

## 2023-07-30 DIAGNOSIS — G40909 Epilepsy, unspecified, not intractable, without status epilepticus: Secondary | ICD-10-CM

## 2023-07-30 MED ORDER — GADOPICLENOL 0.5 MMOL/ML IV SOLN
10.0000 mL | Freq: Once | INTRAVENOUS | Status: AC | PRN
  Administered 2023-07-30: 10 mL via INTRAVENOUS

## 2023-08-01 DIAGNOSIS — G40909 Epilepsy, unspecified, not intractable, without status epilepticus: Secondary | ICD-10-CM

## 2023-08-14 ENCOUNTER — Ambulatory Visit: Payer: MEDICAID | Admitting: Orthopedic Surgery

## 2023-08-24 ENCOUNTER — Emergency Department (HOSPITAL_COMMUNITY)
Admission: EM | Admit: 2023-08-24 | Discharge: 2023-08-25 | Disposition: A | Discharge status: Other Acute Inpatient Hospital | Payer: MEDICAID | Attending: Emergency Medicine | Admitting: Emergency Medicine

## 2023-08-24 ENCOUNTER — Other Ambulatory Visit: Payer: Self-pay

## 2023-08-24 DIAGNOSIS — G8929 Other chronic pain: Secondary | ICD-10-CM | POA: Insufficient documentation

## 2023-08-24 DIAGNOSIS — F3130 Bipolar disorder, current episode depressed, mild or moderate severity, unspecified: Secondary | ICD-10-CM | POA: Insufficient documentation

## 2023-08-24 DIAGNOSIS — F1010 Alcohol abuse, uncomplicated: Secondary | ICD-10-CM | POA: Insufficient documentation

## 2023-08-24 DIAGNOSIS — Z1152 Encounter for screening for COVID-19: Secondary | ICD-10-CM | POA: Insufficient documentation

## 2023-08-24 DIAGNOSIS — F1020 Alcohol dependence, uncomplicated: Secondary | ICD-10-CM | POA: Diagnosis present

## 2023-08-24 DIAGNOSIS — F419 Anxiety disorder, unspecified: Secondary | ICD-10-CM | POA: Diagnosis not present

## 2023-08-24 DIAGNOSIS — F17210 Nicotine dependence, cigarettes, uncomplicated: Secondary | ICD-10-CM | POA: Diagnosis not present

## 2023-08-24 DIAGNOSIS — F314 Bipolar disorder, current episode depressed, severe, without psychotic features: Secondary | ICD-10-CM | POA: Diagnosis not present

## 2023-08-24 DIAGNOSIS — F32A Depression, unspecified: Secondary | ICD-10-CM | POA: Diagnosis not present

## 2023-08-24 DIAGNOSIS — R45851 Suicidal ideations: Secondary | ICD-10-CM | POA: Diagnosis not present

## 2023-08-24 DIAGNOSIS — Y907 Blood alcohol level of 200-239 mg/100 ml: Secondary | ICD-10-CM | POA: Diagnosis not present

## 2023-08-24 DIAGNOSIS — M545 Low back pain, unspecified: Secondary | ICD-10-CM | POA: Diagnosis not present

## 2023-08-24 DIAGNOSIS — G4700 Insomnia, unspecified: Secondary | ICD-10-CM | POA: Diagnosis not present

## 2023-08-24 LAB — CBC
HCT: 39.4 % (ref 39.0–52.0)
Hemoglobin: 14 g/dL (ref 13.0–17.0)
MCH: 34.1 pg — ABNORMAL HIGH (ref 26.0–34.0)
MCHC: 35.5 g/dL (ref 30.0–36.0)
MCV: 95.9 fL (ref 80.0–100.0)
Platelets: 302 10*3/uL (ref 150–400)
RBC: 4.11 MIL/uL — ABNORMAL LOW (ref 4.22–5.81)
RDW: 13.3 % (ref 11.5–15.5)
WBC: 4.7 10*3/uL (ref 4.0–10.5)
nRBC: 0 % (ref 0.0–0.2)

## 2023-08-24 NOTE — ED Triage Notes (Signed)
Patient coming to ED for evaluation of having suicidal thoughts and "going through alcohol withdrawals."  Requesting detox from ETOH and would like to go to rehab.  Last drink was "a couple of hours ago."

## 2023-08-25 ENCOUNTER — Encounter (HOSPITAL_COMMUNITY): Payer: Self-pay | Admitting: Psychiatric/Mental Health

## 2023-08-25 DIAGNOSIS — F1020 Alcohol dependence, uncomplicated: Secondary | ICD-10-CM

## 2023-08-25 DIAGNOSIS — F314 Bipolar disorder, current episode depressed, severe, without psychotic features: Secondary | ICD-10-CM

## 2023-08-25 DIAGNOSIS — R45851 Suicidal ideations: Secondary | ICD-10-CM

## 2023-08-25 LAB — RAPID URINE DRUG SCREEN, HOSP PERFORMED
Amphetamines: NOT DETECTED
Barbiturates: NOT DETECTED
Benzodiazepines: NOT DETECTED
Cocaine: NOT DETECTED
Opiates: NOT DETECTED
Tetrahydrocannabinol: NOT DETECTED

## 2023-08-25 LAB — COMPREHENSIVE METABOLIC PANEL
ALT: 55 U/L — ABNORMAL HIGH (ref 0–44)
AST: 69 U/L — ABNORMAL HIGH (ref 15–41)
Albumin: 3.3 g/dL — ABNORMAL LOW (ref 3.5–5.0)
Alkaline Phosphatase: 47 U/L (ref 38–126)
Anion gap: 11 (ref 5–15)
BUN: 13 mg/dL (ref 6–20)
CO2: 23 mmol/L (ref 22–32)
Calcium: 8.3 mg/dL — ABNORMAL LOW (ref 8.9–10.3)
Chloride: 104 mmol/L (ref 98–111)
Creatinine, Ser: 1.18 mg/dL (ref 0.61–1.24)
GFR, Estimated: >60 mL/min (ref >60)
Glucose, Bld: 96 mg/dL (ref 70–99)
Potassium: 4.2 mmol/L (ref 3.5–5.1)
Sodium: 138 mmol/L (ref 135–145)
Total Bilirubin: 0.3 mg/dL (ref 0.3–1.2)
Total Protein: 5.8 g/dL — ABNORMAL LOW (ref 6.5–8.1)

## 2023-08-25 LAB — SALICYLATE LEVEL: Salicylate Lvl: <7 mg/dL — ABNORMAL LOW (ref 7.0–30.0)

## 2023-08-25 LAB — SARS CORONAVIRUS 2 BY RT PCR: SARS Coronavirus 2 by RT PCR: NEGATIVE

## 2023-08-25 LAB — ACETAMINOPHEN LEVEL: Acetaminophen (Tylenol), Serum: <10 ug/mL — ABNORMAL LOW (ref 10–30)

## 2023-08-25 LAB — ETHANOL: Alcohol, Ethyl (B): 205 mg/dL — ABNORMAL HIGH (ref <10)

## 2023-08-25 MED ORDER — FOLIC ACID 1 MG PO TABS
1.0000 mg | ORAL_TABLET | Freq: Every day | ORAL | Status: DC
  Administered 2023-08-25: 1 mg via ORAL
  Filled 2023-08-25: qty 1

## 2023-08-25 MED ORDER — ONDANSETRON 4 MG PO TBDP: 4.0000 mg | ORAL_TABLET | Freq: Once | ORAL | Status: AC

## 2023-08-25 MED ORDER — ARIPIPRAZOLE 5 MG PO TABS
5.0000 mg | ORAL_TABLET | Freq: Every day | ORAL | Status: DC
  Administered 2023-08-25: 5 mg via ORAL
  Filled 2023-08-25: qty 1

## 2023-08-25 MED ORDER — LORAZEPAM 2 MG/ML IJ SOLN: 0.0000 mg | Freq: Four times a day (QID) | INTRAMUSCULAR | Status: DC

## 2023-08-25 MED ORDER — THIAMINE HCL 100 MG/ML IJ SOLN: 100.0000 mg | Freq: Every day | INTRAMUSCULAR | Status: DC

## 2023-08-25 MED ORDER — LORAZEPAM 2 MG/ML IJ SOLN: 0.0000 mg | Freq: Two times a day (BID) | INTRAMUSCULAR | Status: DC

## 2023-08-25 MED ORDER — LORAZEPAM 1 MG PO TABS: 1.0000 mg | ORAL_TABLET | ORAL | Status: DC | PRN

## 2023-08-25 MED ORDER — TRAZODONE HCL 50 MG PO TABS: 150.0000 mg | ORAL_TABLET | Freq: Every evening | ORAL | Status: DC | PRN

## 2023-08-25 MED ORDER — LORAZEPAM 1 MG PO TABS
0.0000 mg | ORAL_TABLET | Freq: Four times a day (QID) | ORAL | Status: DC
  Administered 2023-08-25 (×3): 1 mg via ORAL
  Filled 2023-08-25 (×3): qty 1

## 2023-08-25 MED ORDER — THIAMINE MONONITRATE 100 MG PO TABS
100.0000 mg | ORAL_TABLET | Freq: Every day | ORAL | Status: DC
  Administered 2023-08-25: 100 mg via ORAL
  Filled 2023-08-25: qty 1

## 2023-08-25 MED ORDER — ONDANSETRON 4 MG PO TBDP
ORAL_TABLET | ORAL | Status: AC
  Administered 2023-08-25: 4 mg via ORAL
  Filled 2023-08-25: qty 1

## 2023-08-25 MED ORDER — LORAZEPAM 1 MG PO TABS: 0.0000 mg | ORAL_TABLET | Freq: Two times a day (BID) | ORAL | Status: DC

## 2023-08-25 MED ORDER — ADULT MULTIVITAMIN W/MINERALS CH
1.0000 | ORAL_TABLET | Freq: Every day | ORAL | Status: DC
  Administered 2023-08-25: 1 via ORAL
  Filled 2023-08-25: qty 1

## 2023-08-25 MED ORDER — BUSPIRONE HCL 10 MG PO TABS
10.0000 mg | ORAL_TABLET | Freq: Three times a day (TID) | ORAL | Status: DC
  Administered 2023-08-25 (×2): 10 mg via ORAL
  Filled 2023-08-25 (×2): qty 1

## 2023-08-25 NOTE — Progress Notes (Signed)
CSW sent negative COVID results to [facility] for PENDING acceptance of COVID results.   John Vaughan, MSW, [facility] 08/25/2023 2:49 PM

## 2023-08-25 NOTE — ED Provider Notes (Signed)
[facility]-EMERGENCY DEPT [facility] Emergency Department Provider Note MRN:  [id number]  Arrival date & time: 08/25/23     Chief Complaint   Suicidal and Alcohol Problem   History of Present Illness   John Vaughan is a 32 y.o. year-old male presents to the ED with chief complaint of suicidal thoughts.  Patient reports that he has relapsed on drugs and alcohol.  States that this is causing him to feel suicidal.  States that he wants help and wants to be admitted and have detox.  He complains of chronic back pain.  Denies any new pain or illness.  History provided by patient.   Review of Systems  Pertinent positive and negative review of systems noted in HPI.    Physical Exam   Vitals:   08/25/23 0257 08/25/23 0306  BP: 103/69   Pulse: 75   Resp: 20   Temp: 97.7 F (36.5 C)   SpO2: 98% 98%    CONSTITUTIONAL:  well-appearing, NAD NEURO:  Alert and oriented x 3, CN 3-12 grossly intact EYES:  eyes equal and reactive ENT/NECK:  Supple, no stridor  CARDIO:  normal rate, regular rhythm, appears well-perfused  PULM:  No respiratory distress, CTAB GI/GU:  non-distended,  MSK/SPINE:  No gross deformities, no edema, moves all extremities  SKIN:  no rash, atraumatic   *Additional and/or pertinent findings included in MDM below  Diagnostic and Interventional Summary    EKG Interpretation Date/Time:  Sunday August 25 2023 02:08:54 EDT Ventricular Rate:  79 PR Interval:  174 QRS Duration:  108 QT Interval:  374 QTC Calculation: 429 R Axis:   17  Text Interpretation: Sinus rhythm RSR' in V1 or V2, right VCD or RVH When compared with ECG of 06/18/2023, No significant change was found Confirmed by Dione Booze ([id number]) on 08/25/2023 6:01:42 AM       Labs Reviewed  CBC - Abnormal; Notable for the following components:      Result Value   RBC 4.11 (*)    MCH 34.1 (*)    All other components within normal limits  ACETAMINOPHEN LEVEL - Abnormal; Notable for the  following components:   Acetaminophen (Tylenol), Serum <10 (*)    All other components within normal limits  ETHANOL - Abnormal; Notable for the following components:   Alcohol, Ethyl (B) 205 (*)    All other components within normal limits  SALICYLATE LEVEL - Abnormal; Notable for the following components:   Salicylate Lvl <7.0 (*)    All other components within normal limits  RAPID URINE DRUG SCREEN, HOSP PERFORMED  COMPREHENSIVE METABOLIC PANEL    No orders to display    Medications  LORazepam (ATIVAN) injection 0-4 mg ( Intravenous See Alternative 08/25/23 0257)    Or  LORazepam (ATIVAN) tablet 0-4 mg (1 mg Oral Given 08/25/23 0257)  LORazepam (ATIVAN) injection 0-4 mg (has no administration in time range)    Or  LORazepam (ATIVAN) tablet 0-4 mg (has no administration in time range)  thiamine (VITAMIN B1) tablet 100 mg (has no administration in time range)    Or  thiamine (VITAMIN B1) injection 100 mg (has no administration in time range)  ondansetron (ZOFRAN-ODT) disintegrating tablet 4 mg (4 mg Oral Given 08/25/23 0149)     Procedures  /  Critical Care Procedures  ED Course and Medical Decision Making  I have reviewed the triage vital signs, the nursing notes, and pertinent available records from the EMR.  Social Determinants Affecting Complexity of Care: Patient  has no clinically significant social determinants affecting this chief complaint..   ED Course:    Medical Decision Making Amount and/or Complexity of Data Reviewed Labs: ordered. ECG/medicine tests: ordered.  Risk OTC drugs. Prescription drug management.         Consultants: TTS consult pending.   Treatment and Plan: Dispo per TTS consult.    Final Clinical Impressions(s) / ED Diagnoses     ICD-10-CM   1. Alcohol abuse  F10.10     2. Suicidal ideations  R45.851       ED Discharge Orders     None         Discharge Instructions Discussed with and Provided to Patient:    Discharge Instructions   None      Roxy Horseman, PA-C 08/25/23 3086    Palumbo, April, MD 08/25/23 [phone]

## 2023-08-25 NOTE — Consult Note (Signed)
[facility] Consult Note  Patient Name: John Vaughan MRN: [id number] DOB: [birth year] DATE OF Consult: 08/25/2023  PRIMARY PSYCHIATRIC DIAGNOSES  1.  Bipolar Disorder, current episode depressed, by history 2.  Alcohol Use Disorder, Severe, Dependence 3.  Suicidal Ideations   RECOMMENDATIONS  Recommendations: Medication recommendations: restart home medication: Abilify 5mg  po daily for mood, Buspar 10mg  po TID for anxiety, and Trazodone 150mg  po at bedtime PRN for sleep.  Non-Medication/therapeutic recommendations: CIWA protocol with Ativan 1mg  po q4h PRN for alcohol withdrawal. Comfort measures, adequate hydration. VOL psychiatric admission. Patient does meet criteria for [name] if not VOL.  Is inpatient psychiatric hospitalization recommended for this patient? Yes (Explain why): Depression, SI, in the setting of alcohol dependence  Communication: Treatment team members (and family members if applicable) who were involved in treatment/care discussions and planning, and with whom we spoke or engaged with via secure text/chat, include the following: ED primary team  Thank you for involving [place] in the care of this patient. If you have any additional questions or concerns, please call [phone] and ask for me or the provider on-call.  TELEPSYCHIATRY ATTESTATION & CONSENT  As the provider for this telehealth consult, I attest that I verified the patient's identity using two separate identifiers, introduced myself to the patient, provided my credentials, disclosed my location, and performed this encounter via a HIPAA-compliant, real-time, face-to-face, two-way, interactive audio and video platform and with the full consent and agreement of the patient (or guardian as applicable.)  Patient physical location: ED in [facility]. Telehealth provider physical location: home office in state of [place].  Video start time: 0608 [facility] Time) Video end time: 0620 [facility] Time)   IDENTIFYING DATA  John Vaughan is a 32 y.o. year-old male for whom a psychiatric consultation has been ordered by the primary provider. The patient was identified using two separate identifiers.  CHIEF COMPLAINT/REASON FOR CONSULT  Suicidal Ideations, Substance Use/ Detox   HISTORY OF PRESENT ILLNESS (HPI)  The patient is a 32yo male who presented to the emergency department for concern of suicidal ideations in the setting of recent relapse on drugs and alcohol. BAL 205 upon arrival. UDS negative.   Patient evaluated 1:1. He is calm and cooperative. Appears depressed, constricted affect. Pleasant. Patient states "it's been a rough week". He goes on to share "It's starting to get unbearable, everything is too much. I drink too much, it's not something I want to do. I need to find different coping skills, I want help". Patient reports recent depression and anxiety, ongoing for several months. His biggest stressors at this time are financial burdens and worries about his Mother who is a drug addict "you can't help those that don't want help". Patient endorses depressive mood more days than not, decreased sleep with difficulty maintaining throughout the night, and increased appetite. He endorses anhedonia, helplessness, hopelessness, worthlessness, and guilt. About 1 week ago he started having suicidal ideations without a specific plan but states he would act impulsively to put himself in danger of harming himself. He has attempted suicide 2-3 years ago with a knife. No homicidal ideations. No access to guns. Denies symptoms of psychosis, mania.   Patient reports turning to alcohol to help cope with his emotions. States his alcohol consumption has worsened in the last few months; before he was drinking less but now drinks #12 24oz beers daily. He has chronic alcohol use. Last drink was around 8PM last night. He has experienced seizure activity going through withdrawal.  Has seizure disorder. No  history of DWI/ DUI. Has attended detox in the past. Complains of fatigue at this time; no tremors, nausea/ vomiting, GI upset, tremors, diaphoresis, hallucinations currently. Only craving a cigarette. Endorses past drug use but denies current use.   Patient lives with mother, mother's friend, and fiance. He would like to go inpatient to help improve his depression and then would like to transition to 30 day program to address his alcoholism.       PAST PSYCHIATRIC HISTORY  Past psychiatric hospitalizations: 1-40months ago Past Diagnoses: Bipolar Depression, Alcohol Use Disorder, Schizophrenia Outpatient [facility]  Psychotropic Medications: Abilify, Buspar, Trazodone Suicide Attempts- Yes  Otherwise as per HPI above.  PAST MEDICAL HISTORY  Past Medical History:  Diagnosis Date   Anxiety    Bipolar 2 disorder (HCC)    Depression    Seizures (HCC)      HOME MEDICATIONS  Facility Ordered Medications  Medication   [COMPLETED] ondansetron (ZOFRAN-ODT) disintegrating tablet 4 mg   LORazepam (ATIVAN) injection 0-4 mg   Or   LORazepam (ATIVAN) tablet 0-4 mg   [START ON 08/27/2023] LORazepam (ATIVAN) injection 0-4 mg   Or   [START ON 08/27/2023] LORazepam (ATIVAN) tablet 0-4 mg   thiamine (VITAMIN B1) tablet 100 mg   Or   thiamine (VITAMIN B1) injection 100 mg   PTA Medications  Medication Sig   pantoprazole (PROTONIX) 40 MG tablet Take 1 tablet (40 mg total) by mouth daily.   ARIPiprazole (ABILIFY) 5 MG tablet Take 1 tablet (5 mg total) by mouth daily.   busPIRone (BUSPAR) 10 MG tablet Take 1 tablet (10 mg total) by mouth 3 (three) times daily.   traZODone (DESYREL) 150 MG tablet Take 1 tablet (150 mg total) by mouth at bedtime as needed for sleep.   gabapentin (NEURONTIN) 300 MG capsule Take 2 capsules (600 mg total) by mouth 3 (three) times daily.   Oxcarbazepine (TRILEPTAL) 300 MG tablet Take 1 tablet (300 mg total) by mouth in the morning.   Oxcarbazepine  (TRILEPTAL) 600 MG tablet Take 1 tablet (600 mg total) by mouth at bedtime.   levETIRAcetam (KEPPRA) 500 MG tablet Take 500 mg by mouth 2 (two) times daily.   ondansetron (ZOFRAN-ODT) 8 MG disintegrating tablet Take 8 mg by mouth every 8 (eight) hours as needed.     ALLERGIES  Allergies  Allergen Reactions   Food Diarrhea, Nausea And Vomiting and Rash    Nuts   Chicken Allergy Hives and Diarrhea   Fruit Extracts Diarrhea    SOCIAL & SUBSTANCE USE HISTORY  Social History   Socioeconomic History   Marital status: Significant Other    Spouse name: elliott   Number of children: 1   Years of education: 12   Highest education level: 12th grade  Occupational History   Not on file  Tobacco Use   Smoking status: Every Day    Current packs/day: 1.00    Average packs/day: 0.8 packs/day for 30.1 years (22.6 ttl pk-yrs)    Types: Cigarettes    Start date: 07/20/2008   Smokeless tobacco: Former    Types: Snuff    Quit date: 07/21/2023  Vaping Use   Vaping status: Never Used  Substance and Sexual Activity   Alcohol use: Not Currently    Alcohol/week: 28.0 standard drinks of alcohol    Types: 28 Cans of beer per week    Comment: drunk yesterday 28 cans of 12 oz beer   Drug use:  Not Currently    Types: Cocaine    Comment: last used cocaine 4 months ago, usually one gram used weekly   Sexual activity: Yes    Birth control/protection: I.U.D.  Other Topics Concern   Not on file  Social History Narrative   Right Handed    No Caffeine Use    Social Determinants of Health   Financial Resource Strain: Low Risk  (07/04/2023)   Received from [facility]   Overall Financial Resource Strain (CARDIA)    Difficulty of Paying Living Expenses: Not hard at all  Food Insecurity: No Food Insecurity (07/21/2023)   Hunger Vital Sign    Worried About Running Out of Food in the Last Year: Never true    Ran Out of Food in the Last Year: Never true  Recent Concern: Food Insecurity - Food Insecurity  Present (06/09/2023)   Hunger Vital Sign    Worried About Running Out of Food in the Last Year: Sometimes true    Ran Out of Food in the Last Year: Sometimes true  Transportation Needs: No Transportation Needs (07/21/2023)   PRAPARE - Administrator, Civil Service (Medical): No    Lack of Transportation (Non-Medical): No  Recent Concern: Transportation Needs - Unmet Transportation Needs (06/09/2023)   PRAPARE - Transportation    Lack of Transportation (Medical): Yes    Lack of Transportation (Non-Medical): Yes  Physical Activity: Unknown (07/04/2023)   [facility]   Exercise Vital Sign    Days of Exercise per Week: 0 days    Minutes of Exercise per Session: Not on file  Stress: Stress Concern Present (07/04/2023)   Received from [facility] - Occupational Stress Questionnaire    Feeling of Stress : Very much  Social Connections: Socially Isolated (07/04/2023)   Received from [facility]   Social Network    How would you rate your social network (family, work, friends)?: Little participation, lonely and socially isolated   Social History   Tobacco Use  Smoking Status Every Day   Current packs/day: 1.00   Average packs/day: 0.8 packs/day for 30.1 years (22.6 ttl pk-yrs)   Types: Cigarettes   Start date: 07/20/2008  Smokeless Tobacco Former   Types: Snuff   Quit date: 07/21/2023   Social History   Substance and Sexual Activity  Alcohol Use Not Currently   Alcohol/week: 28.0 standard drinks of alcohol   Types: 28 Cans of beer per week   Comment: drunk yesterday 28 cans of 12 oz beer   Social History   Substance and Sexual Activity  Drug Use Not Currently   Types: Cocaine   Comment: last used cocaine 4 months ago, usually one gram used weekly    Additional pertinent information: lives with fiance, mother, and mother's friend. Currently unemployed.     FAMILY HISTORY  Family History  Problem Relation  Age of Onset   Anxiety disorder Mother    Alcohol abuse Father    Anxiety disorder Maternal Aunt    Anxiety disorder Maternal Grandmother    Alcohol abuse Paternal Grandfather    Family Psychiatric History (if known):  Alcoholism and Anxiety Disorders   MENTAL STATUS EXAM (MSE)  Presentation  General Appearance:  Appropriate for Environment; Disheveled  Eye Contact: Fleeting  Speech: Clear and Coherent; Slow  Speech Volume: Decreased  Handedness: Right   Mood and Affect  Mood: Depressed  Affect: Constricted; Depressed   Thought Process  Thought Processes:  Linear  Descriptions of Associations: Intact  Orientation: Full (Time, Place and Person)  Thought Content: Logical  History of Schizophrenia/Schizoaffective disorder: No  Duration of Psychotic Symptoms: N/A  Hallucinations:Hallucinations: None  Ideas of Reference: None  Suicidal Thoughts:Suicidal Thoughts: Yes, Active SI Active Intent and/or Plan: Without Plan  Homicidal Thoughts:Homicidal Thoughts: No   Sensorium  Memory: Recent Good  Judgment: Poor  Insight: Fair; Lacking   Executive Functions  Concentration: Fair  Attention Span: Fair  Recall: Good  Fund of Knowledge: Good  Language: Good   Psychomotor Activity  Psychomotor Activity:Psychomotor Activity: Normal  Assets  Assets: Communication Skills; Desire for Improvement; Housing; Social Support; Physical Health   Sleep  Sleep:Sleep: Poor   VITALS  Blood pressure 106/69, pulse 90, temperature 98 F (36.7 C), temperature source Oral, resp. rate 20, height 6' (1.829 m), weight 98.9 kg, SpO2 100%.  LABS  Admission on 08/24/2023  Component Date Value Ref Range Status   WBC 08/24/2023 4.7  4.0 - 10.5 K/uL Final   RBC 08/24/2023 4.11 (L)  4.22 - 5.81 MIL/uL Final   Hemoglobin 08/24/2023 14.0  13.0 - 17.0 g/dL Final   HCT 25/95/6387 39.4  39.0 - 52.0 % Final   MCV 08/24/2023 95.9  80.0 - 100.0 fL Final    MCH 08/24/2023 34.1 (H)  26.0 - 34.0 pg Final   MCHC 08/24/2023 35.5  30.0 - 36.0 g/dL Final   RDW 56/43/3295 13.3  11.5 - 15.5 % Final   Platelets 08/24/2023 302  150 - 400 K/uL Final   nRBC 08/24/2023 0.0  0.0 - 0.2 % Final   Performed at [facility], [address] [place], [place] [address]   Opiates 08/24/2023 NONE DETECTED  NONE DETECTED Final   Cocaine 08/24/2023 NONE DETECTED  NONE DETECTED Final   Benzodiazepines 08/24/2023 NONE DETECTED  NONE DETECTED Final   Amphetamines 08/24/2023 NONE DETECTED  NONE DETECTED Final   Tetrahydrocannabinol 08/24/2023 NONE DETECTED  NONE DETECTED Final   Barbiturates 08/24/2023 NONE DETECTED  NONE DETECTED Final   Comment: (NOTE) DRUG SCREEN FOR MEDICAL PURPOSES ONLY.  IF CONFIRMATION IS NEEDED FOR ANY PURPOSE, NOTIFY LAB WITHIN 5 DAYS.  LOWEST DETECTABLE LIMITS FOR URINE DRUG SCREEN Drug Class                     Cutoff (ng/mL) Amphetamine and metabolites    1000 Barbiturate and metabolites    200 Benzodiazepine                 200 Opiates and metabolites        300 Cocaine and metabolites        300 THC                            50 Performed at [facility], [address] [place], [place] [address]    Acetaminophen (Tylenol), Serum 08/25/2023 <10 (L)  10 - 30 ug/mL Final   Comment: (NOTE) Therapeutic concentrations vary significantly. A range of 10-30 ug/mL  may be an effective concentration for many patients. However, some  are best treated at concentrations outside of this range. Acetaminophen concentrations >150 ug/mL at 4 hours after ingestion  and >50 ug/mL at 12 hours after ingestion are often associated with  toxic reactions.  Performed at [facility], [address], [place], [place] [address]    Alcohol, Ethyl (B) 08/25/2023 205 (H)  <10  mg/dL Final   Comment: (NOTE) Lowest detectable limit for serum alcohol is 10 mg/dL.  For medical purposes only. Performed at [facility], [address] [place], [place] [address]    Sodium 08/25/2023 138  135 - 145 mmol/L Final   Potassium 08/25/2023 4.2  3.5 - 5.1 mmol/L Final   Chloride 08/25/2023 104  98 - 111 mmol/L Final   CO2 08/25/2023 23  22 - 32 mmol/L Final   Glucose, Bld 08/25/2023 96  70 - 99 mg/dL Final   Glucose reference range applies only to samples taken after fasting for at least 8 hours.   BUN 08/25/2023 13  6 - 20 mg/dL Final   POST-ULTRACENTRIFUGATION   Creatinine, Ser 08/25/2023 1.18  0.61 - 1.24 mg/dL Final   POST-ULTRACENTRIFUGATION   Calcium 08/25/2023 8.3 (L)  8.9 - 10.3 mg/dL Final   Total Protein 84/13/2440 5.8 (L)  6.5 - 8.1 g/dL Final   POST-ULTRACENTRIFUGATION   Albumin 08/25/2023 3.3 (L)  3.5 - 5.0 g/dL Final   POST-ULTRACENTRIFUGATION   AST 08/25/2023 69 (H)  15 - 41 U/L Final   ALT 08/25/2023 55 (H)  0 - 44 U/L Final   Alkaline Phosphatase 08/25/2023 47  38 - 126 U/L Final   Total Bilirubin 08/25/2023 0.3  0.3 - 1.2 mg/dL Final   GFR, Estimated 08/25/2023 >60  >60 mL/min Final   Comment: (NOTE) Calculated using the CKD-EPI Creatinine Equation (2021)    Anion gap 08/25/2023 11  5 - 15 Final   Performed at [facility], [address], [place], [place] [address]   Salicylate Lvl 08/25/2023 <7.0 (L)  7.0 - 30.0 mg/dL Final   Comment: POST-ULTRACENTRIFUGATION Performed at [facility], [address] [place], [place] [address]     PSYCHIATRIC REVIEW OF SYSTEMS (ROS)  ROS: Notable for the following relevant positive findings: Review of Systems  Psychiatric/Behavioral:  Positive for depression, substance abuse and suicidal ideas. Negative for hallucinations and memory loss. The patient is nervous/anxious and has insomnia.     Additional findings:      Musculoskeletal: No abnormal movements observed      Gait & Station: Laying/Sitting      Pain Screening: Denies      Nutrition & Dental Concerns: No concerns at this time  RISK FORMULATION/ASSESSMENT  Is  the patient experiencing any suicidal or homicidal ideations: Yes       Explain if yes: Passive/ Active SI without Plan  Protective factors considered for safety management: willingness to seek treatment, access to care, social / family support,   Risk factors/concerns considered for safety management:  Prior attempt Depression Substance abuse/dependence Hopelessness Impulsivity Isolation Male gender Unmarried  Is there a safety management plan with the patient and treatment team to minimize risk factors and promote protective factors: Yes           Explain: Patient currently in the ED, medication management, inpatient psychiatric treatment.  Is crisis care placement or psychiatric hospitalization recommended: Yes     Based on my current evaluation and risk assessment, patient is determined at this time to be at:  High risk  *RISK ASSESSMENT Risk assessment is a dynamic process; it is possible that this patient's condition, and risk level, may change. This should be re-evaluated and managed over time as appropriate. Please re-consult psychiatric consult services if additional assistance is needed in terms of risk assessment and management. If your team decides to discharge this patient, please advise  the patient how to best access emergency psychiatric services, or to call 911, if their condition worsens or they feel unsafe in any way.   Assunta Gambles, NP Telepsychiatry Consult Services

## 2023-08-25 NOTE — BH Assessment (Signed)
Clinician spoke with Jonny Ruiz with [facility] to complete TTS assessment. Clinician provided pt's name, MRN, location, age, provider name and room number. Secure message completed.   [name], MS, [facility], [facility] Triage Specialist [phone]

## 2023-08-25 NOTE — Progress Notes (Signed)
Inpatient Behavioral Health Placement  Pt meets inpatient criteria per Assunta Gambles, NP Telepsychiatry Consult Services. There are no available beds within [facility] BH system per [facility] [employer]. Referral was sent to the following facilities;    Destination  Service Provider Address Phone [facility]  [address] [place], [place] [address] [phone]  [facility]  [address] [place] [address] [phone]  [facility] Patient Placement  [facility], [place] [phone]  [facility]  [address] [place], [place] [address] [phone]  [facility]  [address] [place], [place] [address] [phone]  [facility]  [address] [place] [address] [phone]  [facility]  [address] [place] [address] [phone]  [facility]  [address], [place] [address] [phone]  [facility]  [address], [place] [address] [phone]  [facility]  [address], [place] [address] [phone]  [facility]  [address] [place] [address] [phone]  [facility]  [place] [phone]  [facility]  [address] [place] [address] [phone]  [facility] EFAX  [address] [place], [place] [phone]  [facility]  [address] [place] [address] [phone]  [facility]  [address], [place] [address] [phone]   [facility]  [address] [place], [place] [address] [phone]  [facility]  [address] [place] [address] [phone]  [facility] Psychiatry Inpatient [facility]  [place] [phone]  [facility]  [address] [place] [address] [phone]  [facility]  [address] [place] [address] [phone]  [facility] [place] [address] [phone]  [facility]  [facility] [address]egino Bellow [place] [address] [phone]  [facility]  [address] [place] [address] [phone]  [facility] [address]  [address] [place] [address] [phone]   Situation ongoing,  CSW will follow up.   Maryjean Ka, MSW, [name]

## 2023-08-25 NOTE — Progress Notes (Signed)
Pt was accepted to [facility] TODAY 08/25/2023; Bed Assignment Susann Givens 2 [place] PENDING negative COVID faxed to [employer] fax [phone].  Pt meets inpatient criteria per Assunta Gambles, NP Telepsychiatry Consult Services as of 08/25/23  [facility] ED Current day provider Earney Navy, NP-PMHNP-BC   Attending Physician will be Dr. Len Childs, MD  Report can be called to: - [phone]  Pt can arrive after: BED IS READY NOW PENDING CONFORMATION OF PENDING NEGATIVE COVID.  Care Team notified: [facility] Rosey Bath, RN, Earney Navy, NP-PMHNP-BC,[address] [place], [place] 08/25/2023 @ 12:26 PM

## 2023-08-25 NOTE — ED Provider Notes (Signed)
Emergency Medicine Observation Re-evaluation Note  John Vaughan is a 32 y.o. male, seen on rounds today.  Pt initially presented to the ED for complaints of Suicidal and Alcohol Problem Currently, the patient is awaiting placement.  Physical Exam  BP 106/69 (BP Location: Left Arm)   Pulse 90   Temp 98 F (36.7 C) (Oral)   Resp 20   Ht 6' (1.829 m)   Wt 98.9 kg   SpO2 100%   BMI 29.57 kg/m  Physical Exam General: NAD Cardiac: RR Lungs: unlabored Psych: NA  ED Course / MDM  EKG:EKG Interpretation Date/Time:  Sunday August 25 2023 02:08:54 EDT Ventricular Rate:  79 PR Interval:  174 QRS Duration:  108 QT Interval:  374 QTC Calculation: 429 R Axis:   17  Text Interpretation: Sinus rhythm RSR' in V1 or V2, right VCD or RVH When compared with ECG of 06/18/2023, No significant change was found Confirmed by Dione Booze ([id number]) on 08/25/2023 6:01:42 AM  I have reviewed the labs performed to date as well as medications administered while in observation.  Recent changes in the last 24 hours include none.  Plan  Current plan is for transfer.    Alvira Monday, MD 08/26/23 [phone]

## 2023-09-12 ENCOUNTER — Encounter: Payer: Self-pay | Admitting: Orthopedic Surgery

## 2023-09-12 ENCOUNTER — Other Ambulatory Visit (INDEPENDENT_AMBULATORY_CARE_PROVIDER_SITE_OTHER): Payer: No Typology Code available for payment source

## 2023-09-12 ENCOUNTER — Ambulatory Visit (INDEPENDENT_AMBULATORY_CARE_PROVIDER_SITE_OTHER): Payer: No Typology Code available for payment source | Admitting: Orthopedic Surgery

## 2023-09-12 VITALS — BP 111/77 | HR 66 | Ht 72.0 in | Wt 218.0 lb

## 2023-09-12 DIAGNOSIS — M546 Pain in thoracic spine: Secondary | ICD-10-CM

## 2023-09-12 NOTE — Progress Notes (Signed)
Orthopedic Spine Surgery Office Note 2  Assessment: Patient is a 32 y.o. male with chronic lower thoracic back pain. Has an old T7 compression fracture but his pain is more at the thoracolumbar junction   Plan: -No operative intervention planned -Recommended core strengthening -Told him if he has a flare of pain, he should call the office and I will prescribe a medrol dose pak -Would need to be nicotine free prior to any elective spine surgery -Patient should return to office on an as needed basis   Patient expressed understanding of the plan and all questions were answered to the patient's satisfaction.   ___________________________________________________________________________   History:  Patient is a 32 y.o. male who presents today for lumbar spine. Patient fell down some stairs when he had a seizure approximately 3 years ago which preceded the onset of his thoracic back pain.  His pain is felt in the lower thoracic spine.  He sometimes has middle thoracic back pain when he is lifting something heavy.  The pain gets worse especially with any kind of heavy lifting or bending activities.  He does not have much pain if he is sitting or resting.  Pain does not radiate into either lower extremity.  When it is severe, he describes the pain as sharp. Pain has been stable since onset 3 years ago. No recent changes in symptoms.    Pan positive spine review of systems including: weakness, incontinence, pain at night, night sweats, clumsiness with hands, numbness in groin, feeling off balance. All these symptoms have been present for several years  Treatments tried: tylenol, NSAIDs  Review of systems: Denies fevers and chills, night sweats, unexplained weight loss, history of cancer, pain that wakes them at night  Past medical history: Bipolar disorder Anxiety Epilepsy Chronic pain Irritable bowel syndrome GERD  Allergies: NKDA  Past surgical history:  Right hand  surgery Myringotomy tubes  Social history: Reports use of nicotine product (smoking, vaping, patches, smokeless) Alcohol use: no current use, recent history of alcohol abuse Denies recreational drug use   Physical Exam:  BMI of 29.6  General: no acute distress, appears stated age Neurologic: alert, answering questions appropriately, following commands Respiratory: unlabored breathing on room air, symmetric chest rise Psychiatric: appropriate affect, normal cadence to speech   MSK (spine):  -Strength exam      Left  Right EHL    5/5  5/5 TA    5/5  5/5 GSC    5/5  5/5 Knee extension  5/5  5/5 Hip flexion   5/5  5/5  -Sensory exam    Sensation intact to light touch in L3-S1 nerve distributions of bilateral lower extremities  -Achilles DTR: 2/4 on the left, 2/4 on the right -Patellar tendon DTR: 2/4 on the left, 2/4 on the right  -Straight leg raise: Negative bilaterally -Clonus: no beats bilaterally -Gait: Normal -Negative Hoffmann bilaterally -No interosseous muscle wasting seen -Negative Romberg  -Left hip exam: No pain through range of motion -Right hip exam: No pain through range of motion  Imaging: XRs of the lumbar spine from 09/12/2023 was independently reviewed and interpreted, showing T7 compression fracture with anterior height loss. No retropulsed fragment seen.   CT of the thoracic spine from 05/24/2021 was independently reviewed and interpreted, showing a compression fracture at T7. Vacuum disc phenomenon at T6/7. Superior endplate fracture at T6.   MRI of the lumbar spine from 03/03/2021 was independently reviewed and interpreted, showing no central, lateral recess, foraminal stenosis.  No degenerative  disc disease seen.   Patient name: John Vaughan Patient MRN: [id number] Date of visit: 09/12/23

## 2023-09-30 ENCOUNTER — Telehealth: Payer: Self-pay | Admitting: Neurology

## 2023-09-30 ENCOUNTER — Other Ambulatory Visit: Payer: Self-pay | Admitting: Neurology

## 2023-09-30 DIAGNOSIS — Z5181 Encounter for therapeutic drug level monitoring: Secondary | ICD-10-CM

## 2023-09-30 DIAGNOSIS — G40909 Epilepsy, unspecified, not intractable, without status epilepticus: Secondary | ICD-10-CM

## 2023-09-30 MED ORDER — DIVALPROEX SODIUM 500 MG PO DR TAB: 1000.0000 mg | DELAYED_RELEASE_TABLET | Freq: Two times a day (BID) | ORAL | 11 refills | Status: DC

## 2023-09-30 MED ORDER — OXCARBAZEPINE 600 MG PO TABS: 600.0000 mg | ORAL_TABLET | Freq: Two times a day (BID) | ORAL | 11 refills | Status: DC

## 2023-09-30 NOTE — Telephone Encounter (Signed)
Pt's Sgo called needing to speak to the RN about the pt recently having more seizures. She states the more stress he has the more frequent his seizures happen. She would like to be advised if he is to be seen sooner or if there should be a medication change etc. Please advise.

## 2023-09-30 NOTE — Progress Notes (Signed)
Having side effect of Keppra, fiancee decrease to 500 mg BID, and having breakthrough seizures. Will discontinue Keppra, Increase Trileptal to 600 mg twice daily and add Depakote 1000 mg twice daily.   Dr. Teresa Coombs

## 2023-09-30 NOTE — Telephone Encounter (Signed)
Call to Elliot,patiens fiance. She report last week had 10 seizures on Tuesday 10/1 and several on 10/2. SH does report patient missed nighttime dose of medications on 9/30. She  reports increased stress in the household. She denies head strikes and says the seizures were where his body would stiffen and eyes roll back but never convulsed.  She also reports never hearing from [facility] about 72 hour EEG. Advised I would and to Dr. Teresa Coombs for review and advise.

## 2023-09-30 NOTE — Telephone Encounter (Signed)
Call back to fiance, [name]. Reviewed Dr. Teresa Coombs medication changes and dose adjustments. [name] verbalized understanding and per verbal order from Dr. Teresa Coombs patient to come back for labs in 2 weeks and not 6 weeks. [name] appreciative of call and used teach back method

## 2023-09-30 NOTE — Telephone Encounter (Signed)
Orders and demographics faxed to [facility].

## 2023-09-30 NOTE — Telephone Encounter (Signed)
Keppra discontinued, increase Trileptal to 600 mg BID and add  Depakote 1000 mg BID. Please have patient return in 6 weeks for labs. No appointment needed.

## 2023-10-07 ENCOUNTER — Encounter: Payer: Self-pay | Admitting: Neurology

## 2023-10-07 ENCOUNTER — Other Ambulatory Visit: Payer: Self-pay | Admitting: Neurology

## 2023-10-07 DIAGNOSIS — G40909 Epilepsy, unspecified, not intractable, without status epilepticus: Secondary | ICD-10-CM

## 2023-10-07 DIAGNOSIS — Z5181 Encounter for therapeutic drug level monitoring: Secondary | ICD-10-CM

## 2023-10-07 MED ORDER — OXCARBAZEPINE 600 MG PO TABS: 900.0000 mg | ORAL_TABLET | Freq: Two times a day (BID) | ORAL | 6 refills | Status: DC

## 2023-10-07 NOTE — Telephone Encounter (Signed)
Please have them stop the Depakote since he is having side effect and increase the Trileptal to 900 (1.5 tablet) twice daily. Please let us know how he is doing with the new changes.

## 2023-10-07 NOTE — Progress Notes (Signed)
Side effect of Keppra, switch to Depakote and now having side effect of Depakote, will discontinue Depakote and increase Oxcarbazepine to 900 mg twice daily.

## 2023-10-10 ENCOUNTER — Encounter: Payer: Self-pay | Admitting: Neurology

## 2023-10-10 NOTE — Telephone Encounter (Signed)
He should follow up with PCP to have a sleep study done (preferably pulmonologist) if his oxygen keeps falling during sleep.

## 2023-10-14 ENCOUNTER — Other Ambulatory Visit (INDEPENDENT_AMBULATORY_CARE_PROVIDER_SITE_OTHER): Payer: MEDICAID

## 2023-10-14 ENCOUNTER — Ambulatory Visit (INDEPENDENT_AMBULATORY_CARE_PROVIDER_SITE_OTHER): Payer: No Typology Code available for payment source | Admitting: Nurse Practitioner

## 2023-10-14 ENCOUNTER — Encounter: Payer: Self-pay | Admitting: Nurse Practitioner

## 2023-10-14 VITALS — BP 100/58 | HR 96 | Ht 70.0 in | Wt 227.4 lb

## 2023-10-14 DIAGNOSIS — K625 Hemorrhage of anus and rectum: Secondary | ICD-10-CM

## 2023-10-14 DIAGNOSIS — K219 Gastro-esophageal reflux disease without esophagitis: Secondary | ICD-10-CM | POA: Diagnosis not present

## 2023-10-14 DIAGNOSIS — Z789 Other specified health status: Secondary | ICD-10-CM

## 2023-10-14 DIAGNOSIS — R112 Nausea with vomiting, unspecified: Secondary | ICD-10-CM

## 2023-10-14 DIAGNOSIS — K921 Melena: Secondary | ICD-10-CM

## 2023-10-14 DIAGNOSIS — R197 Diarrhea, unspecified: Secondary | ICD-10-CM | POA: Diagnosis not present

## 2023-10-14 LAB — COMPREHENSIVE METABOLIC PANEL
ALT: 21 U/L (ref 0–53)
AST: 18 U/L (ref 0–37)
Albumin: 4.4 g/dL (ref 3.5–5.2)
Alkaline Phosphatase: 44 U/L (ref 39–117)
BUN: 17 mg/dL (ref 6–23)
CO2: 25 meq/L (ref 19–32)
Calcium: 9.5 mg/dL (ref 8.4–10.5)
Chloride: 108 meq/L (ref 96–112)
Creatinine, Ser: 1.26 mg/dL (ref 0.40–1.50)
GFR: 76.75 mL/min (ref >60.00)
Glucose, Bld: 99 mg/dL (ref 70–99)
Potassium: 4.2 meq/L (ref 3.5–5.1)
Sodium: 144 meq/L (ref 135–145)
Total Bilirubin: 0.3 mg/dL (ref 0.2–1.2)
Total Protein: 6.5 g/dL (ref 6.0–8.3)

## 2023-10-14 LAB — CBC WITH DIFFERENTIAL/PLATELET
Basophils Absolute: 0 10*3/uL (ref 0.0–0.1)
Basophils Relative: 0.8 % (ref 0.0–3.0)
Eosinophils Absolute: 0.1 10*3/uL (ref 0.0–0.7)
Eosinophils Relative: 2.6 % (ref 0.0–5.0)
HCT: 41.2 % (ref 39.0–52.0)
Hemoglobin: 13.8 g/dL (ref 13.0–17.0)
Lymphocytes Relative: 25 % (ref 12.0–46.0)
Lymphs Abs: 1.4 10*3/uL (ref 0.7–4.0)
MCHC: 33.6 g/dL (ref 30.0–36.0)
MCV: 98.2 fL (ref 78.0–100.0)
Monocytes Absolute: 0.7 10*3/uL (ref 0.1–1.0)
Monocytes Relative: 12.6 % — ABNORMAL HIGH (ref 3.0–12.0)
Neutro Abs: 3.3 10*3/uL (ref 1.4–7.7)
Neutrophils Relative %: 59 % (ref 43.0–77.0)
Platelets: 297 10*3/uL (ref 150.0–400.0)
RBC: 4.19 Mil/uL — ABNORMAL LOW (ref 4.22–5.81)
RDW: 12.7 % (ref 11.5–15.5)
WBC: 5.6 10*3/uL (ref 4.0–10.5)

## 2023-10-14 MED ORDER — NA SULFATE-K SULFATE-MG SULF 17.5-3.13-1.6 GM/177ML PO SOLN: 1.0000 | Freq: Once | ORAL | 0 refills | Status: AC

## 2023-10-14 MED ORDER — PANTOPRAZOLE SODIUM 40 MG PO TBEC: 40.0000 mg | DELAYED_RELEASE_TABLET | Freq: Two times a day (BID) | ORAL | 1 refills | Status: DC

## 2023-10-14 NOTE — Progress Notes (Signed)
10/14/2023 [name] [id number] 10-26-1991   CHIEF COMPLAINT: Lower abdominal pain, blood in stool  HISTORY OF PRESENT ILLNESS: [name] B. 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 He presents to our office today as referred by Charna Archer NP for further evaluation regarding lower abdominal pain and blood in stool. He is accompanied by his fiance. He endorses having a history of nausea and GERD for which he took Protonix 40 mg daily for 1 year then stopped taking it 6 months ago. He has significant acid reflux throughout the day and night. He has variable upper and lower abdominal pains. He has nausea most mornings and vomits partially digested food or black emesis once weekly for the past 6 months. Yesterday, he described throwing up black emesis and passed a black loose stool. He took Pepto-Bismol in the past but not recently. Not on oral iron. He passes 2 loose bowel movements most days last passed a solid stool 3 months ago. He sees a small to moderate amount of darker red blood with his bowel movements which occurs once weekly for the past 6 months.  He previously drank 36 beers daily for 15 years then decreased to drinking a 12 pack daily for 6 months and gradually weaned off all alcohol. His last alcohol intake was 09/03/2023.  He endorses having alcohol withdrawal seizures x 2 a few months ago without recurrence. He was seen in the ED at [facility] 06[facility]15/2023 and it was unclear if he had pseudoseizures versus actual seizures as he did not demonstrate any seizure activity in the ED.  He received IV Keppra and he was discharged home on Keppra 750 mg p.o. twice daily.  He was referred to neurology and an outpatient EEG was scheduled. He presented back to the ED 06/28/2023 with reported multiple seizure  activity. 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 He was discharged home with instructions to follow-up with psychiatry and neurology.  He was seen by neurologist Dr.Camara patient Dors having side effects with Keppra so he was switched to Depakote which was subsequently discontinued due to side effects therefore Oxcarbazepine was increased to 900mg  bid.   He last used cocaine 3 months ago.  He sometimes feels his heart beats fast slow when he lays down occurs sporadically.  No associated chest pain or shortness of breath.  He occasionally takes Excedrin for headaches.  No known family history of upper GI or colorectal cancer.     Latest Ref Rng & Units 08/24/2023   11:27 PM 07/21/2023    3:42 AM 06/30/2023    8:15 PM  CBC  WBC 4.0 - 10.5 K/uL 4.7  7.2  5.5   Hemoglobin 13.0 - 17.0 g/dL 40.9  81.1  91.4   Hematocrit 39.0 - 52.0 % 39.4  38.3  43.4   Platelets 150 - 400 K/uL 302  246  278        Latest Ref Rng & Units 08/25/2023   12:45 AM 07/26/2023    6:22 AM 07/21/2023   10:42 AM  CMP  Glucose 70 - 99 mg/dL 96  93  96   BUN 6 - 20 mg/dL 13  14  <5   Creatinine 0.61 -  1.24 mg/dL 9.60  4.54  0.98   Sodium 135 - 145 mmol/L 138  139  139   Potassium 3.5 - 5.1 mmol/L 4.2  3.9  3.6   Chloride 98 - 111 mmol/L 104  103  106   CO2 22 - 32 mmol/L 23  26  22    Calcium 8.9 - 10.3 mg/dL 8.3  9.4  8.8   Total Protein 6.5 - 8.1 g/dL 5.8  7.4    Total Bilirubin 0.3 - 1.2 mg/dL 0.3  0.3    Alkaline Phos 38 - 126 U/L 47  42    AST 15 - 41 U/L 69  22    ALT 0 - 44 U/L 55  27      CTAP 06/24/2023: FINDINGS: Lower chest: Lung bases are clear.   Hepatobiliary: Liver is within normal limits.   Gallbladder is unremarkable. No intrahepatic or extrahepatic duct dilatation.   Pancreas: Within normal limits.   Spleen: Within normal limits.   Adrenals/Urinary Tract: Adrenal glands are  within normal limits.   Kidneys are within normal limits.  No hydronephrosis.   Bladder is within normal limits.   Stomach/Bowel: Stomach is within normal limits.   No evidence of bowel obstruction.   Normal appendix (series 2/image 85).   Colon is largely decompressed, without associated inflammatory changes.   Vascular/Lymphatic: No evidence of abdominal aortic aneurysm.   No suspicious abdominopelvic lymphadenopathy.   Reproductive: Prostate is unremarkable.   Other: No abdominopelvic ascites.   Musculoskeletal: Visualized osseous structures are within normal limits.   IMPRESSION: Negative CT abdomen/pelvis.   Normal appendix.     Past Medical History:  Diagnosis Date   Anxiety    Bipolar 2 disorder (HCC)    Depression    Seizures (HCC)    Past Surgical History:  Procedure Laterality Date   HAND SURGERY Right   Eustachian tube surgery Tonsillectomy  Social History: He is engaged.  He has 1 son.  Currently unemployed.  He smokes 1 pack of cigarettes daily for the past 15 years.  He HPI regarding alcohol intake.  Family History: Father and paternal grandfather with alcohol use disorder.  Mother, maternal grandmother and maternal aunt with anxiety.  Allergies  Allergen Reactions   Food Diarrhea, Nausea And Vomiting and Rash    Nuts   Chicken Allergy Hives and Diarrhea   Fruit Extracts Diarrhea      Outpatient Encounter Medications as of 10/14/2023  Medication Sig   ARIPiprazole (ABILIFY) 5 MG tablet Take 1 tablet (5 mg total) by mouth daily.   gabapentin (NEURONTIN) 300 MG capsule Take 2 capsules (600 mg total) by mouth 3 (three) times daily.   ondansetron (ZOFRAN-ODT) 8 MG disintegrating tablet Take 8 mg by mouth every 8 (eight) hours as needed.   oxcarbazepine (TRILEPTAL) 600 MG tablet Take 1.5 tablets (900 mg total) by mouth 2 (two) times daily.   pantoprazole (PROTONIX) 40 MG tablet Take 1 tablet (40 mg total) by mouth daily.   traZODone (DESYREL)  150 MG tablet Take 1 tablet (150 mg total) by mouth at bedtime as needed for sleep.   No facility-administered encounter medications on file as of 10/14/2023.    REVIEW OF SYSTEMS:  Gen: + Night sweats or chills. No weight loss.  CV: + Heart races when laying down. + Feet swelling.  Resp: Denies cough, shortness of breath of hemoptysis.  GI: See HPI.  GU: + Increased urination.  MS: + Back pain, muscle cramps.  Derm: Denies  rash, itchiness, skin lesions or unhealing ulcers. Psych: + Anxiety and depression. Heme: Denies bruising, easy bleeding. Neuro:  + Headaches and seizures.  Endo: Denies any problems with DM, thyroid or adrenal function.  PHYSICAL EXAM: BP (!) 100/58 (BP Location: Left Arm, Patient Position: Sitting, Cuff Size: Large)   Pulse 96   Ht 5\' 10"  (1.778 m) Comment: height measured without shoes  Wt 227 lb 6 oz (103.1 kg)   BMI 32.62 kg/m   General: 32 year old male in no acute distress. Head: Normocephalic and atraumatic. Eyes:  Sclerae non-icteric, conjunctive pink. Ears: Normal auditory acuity. Mouth: Dentition intact. No ulcers or lesions.  Neck: Supple, no lymphadenopathy or thyromegaly.  Lungs: Clear bilaterally to auscultation without wheezes, crackles or rhonchi. Heart: Regular rate and rhythm. No murmur, rub or gallop appreciated.  Abdomen: Soft, nondistended. Mild tenderness to the epigastric, RUQ and RLQ without rebound or guarding. No masses. No hepatosplenomegaly. Normoactive bowel sounds x 4 quadrants.  Rectal: No external hemorrhoids or fissures.  No stool/melena/blood in the rectal vault.  Wife and Sophia CMA present during exam. Musculoskeletal: Symmetrical with no gross deformities. Skin: Warm and dry. No rash or lesions on visible extremities. Extremities: No edema. Neurological: Alert oriented x 4, no focal deficits.  Psychological:  Alert and cooperative. Normal mood and affect.  ASSESSMENT AND PLAN:  32 year old male with a history of  GERD, upper abdominal pain with N/V and intermittent black emesis and melena x 1 on 10/13/2023.  -EGD benefits and risks discussed including risk with sedation, risk of bleeding, perforation and infection  -Pantoprazole 40 mg twice daily -Stool cards x 3 -Patient instructed to go to the emergency room if he vomits up black or bloody emesis or passes a large amount of dark or bloody stool per the rectal -CBC  Chronic loose stools, intermittently bloody loose stools with lower abdominal pain  -Colonoscopy benefits and risks discussed including risk with sedation, risk of bleeding, perforation and infection   Alcohol use disorder, cocaine use -Patient counseled complete alcohol and drug abstinence, recommended outpatient rehab or AA  Elevated LFTs, likely due to alcohol use disorder.  Reported last alcohol intake was 09/03/2023. 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 Last seizure activity was 06/28/2023    CC:  Cyril Mourning, FNP

## 2023-10-14 NOTE — Patient Instructions (Addendum)
  Continue complete alcohol abstinence, consider outpatient alcohol rehab   Recommend no drug use  Take all of your routine medications in the morning and one hour later Protonix 40mg  one capsule and 30 minutes after that eat breakfast.   Take a second dose of Protonix 30 minutes before dinner  Go to the emergency room if you vomit up black contents or if you pass a large amount of black or red blood stools   Complete the 3 stool cards and return to our office  ______________________________________________________________________  You have been scheduled for a EGD/colonoscopy. Please follow written instructions given to you at your visit today.   Please pick up your prep supplies at the pharmacy within the next 1-3 days.  If you use inhalers (even only as needed), please bring them with you on the day of your procedure.  DO NOT TAKE 7 DAYS PRIOR TO TEST- Trulicity (dulaglutide) Ozempic, Wegovy (semaglutide) Mounjaro (tirzepatide) Bydureon Bcise (exanatide extended release)  DO NOT TAKE 1 DAY PRIOR TO YOUR TEST Rybelsus (semaglutide) Adlyxin (lixisenatide) Victoza (liraglutide) Byetta (exanatide) ___________________________________________________________________________   Please go to the lab in the basement of our building to have lab work done as you leave today. Hit "B" for basement when you get on the elevator.  When the doors open the lab is on your left.  We will call you with the results. Thank you.  We have sent the following medications to your pharmacy for you to pick up at your convenience: Protonix 40 mg: Take 30 minutes before breakfast and dinner  Follow the instructions on the Hemoccult cards and mail them back to [place] when you are finished or you may take them directly to the lab in the basement of the [facility] building. We will call you with the results.    Thank you for entrusting me with your care and for choosing [place], [place],  [place]

## 2023-10-28 ENCOUNTER — Encounter: Payer: Self-pay | Admitting: Neurology

## 2023-10-28 NOTE — Telephone Encounter (Signed)
Called and spoke to John Vaughan, She reports patient is still taking Trileptal, 900mg  BID. He is still having seizures at least once a week where he has a minute or two of staring off and then coming too and not remembering the seizure or the day before, she denies him having missed doses. I saw in the chart that he never came for labs from the last phone notes, Advised them to come in for levels to be checked as orders are still in the chart. John Vaughan also reported he had alcohol on his birthday, she states he had approximately 6 beers, but prior to that it was over a month since he had anything alcoholic to drink and he hasn't had anything since his birthday in September. He needs a detailed letter before the hearing in June to be on disability and states his attorney told him to request the letter. I advised I would reach out to the provider, but this may warrant an office visit. They were grateful for the call.

## 2023-10-30 ENCOUNTER — Encounter: Payer: Self-pay | Admitting: Neurology

## 2023-10-30 NOTE — Telephone Encounter (Signed)
Letter completed.

## 2023-11-01 NOTE — Telephone Encounter (Signed)
Nothing to add to the letter.

## 2023-11-03 ENCOUNTER — Encounter: Payer: Self-pay | Admitting: Certified Registered Nurse Anesthetist

## 2023-11-06 ENCOUNTER — Ambulatory Visit: Payer: MEDICAID | Admitting: Neurology

## 2023-11-08 ENCOUNTER — Telehealth: Payer: Self-pay | Admitting: Nurse Practitioner

## 2023-11-08 ENCOUNTER — Encounter: Payer: Self-pay | Admitting: Neurology

## 2023-11-08 NOTE — Telephone Encounter (Signed)
Left message for patient's significant other [name] to call back.

## 2023-11-08 NOTE — Telephone Encounter (Signed)
Refer to alternate phone note from today. Jill Side, NP was able to discuss cancellation with patient & mother.

## 2023-11-08 NOTE — Telephone Encounter (Signed)
Glendora Score, refer to office visit 10/14/2023, since then patient has experienced seizure activity.  Pls contact patient and his wife this morning and let him know I reviewed a phone note from neurology dated 10/28/2023 which documented the following: "He is still having seizures at least once a week where he has a minute or two of staring off and then coming too and not remembering the seizure or the day before, she denies him having missed doses".   Please cancel his EGD/colonoscopy with Dr. Lavon Paganini in [facility] 11/11/2023. Pls send a request for neuro clearance to his neurologist which is required prior to rescheduling his EGD and colonoscopy. His future EGD/colonoscopy will need to be scheduled in the hospital setting.   "

## 2023-11-08 NOTE — Telephone Encounter (Signed)
John Vaughan [birth year] MRN: [id number]  Dear Dr. Teresa Coombs,   The patient listed above will need to be scheduled for an EGD & colonoscopy with Dr. Lavon Paganini in the hospital setting. Due to patient's recent seizure activity we will need neurology clearance. Please fax a copy with clearance recommendations to Glendora Score, RN at [phone] or route message back.  Thank you!

## 2023-11-08 NOTE — Telephone Encounter (Signed)
Procedure cancelled. Also, sent pt message on mychart.

## 2023-11-08 NOTE — Telephone Encounter (Signed)
I called the patient's mother to facilitate communication with patient and he was  was with his mother at that time. I explained to the patient that his EGD and colonoscopy scheduled Monday 11/18 were cancelled due to recurrence of seizure activity since he was seen in our office. See earlier phone note today with further details. Patient to contact our office to reschedule EGD/colonoscopy once neurology clearance received.

## 2023-11-08 NOTE — Telephone Encounter (Signed)
Agree 

## 2023-11-08 NOTE — Telephone Encounter (Signed)
Done. Thank you.

## 2023-11-08 NOTE — Telephone Encounter (Signed)
    [facility] [address] [place], [place]  [address] Phone:  [phone]   Fax:  [phone]     November 08, 2023   Patient:  [name] MRN:  [id number] Date of Birth:  02/21/1991       To whom this may concern,   Mr. Sanker, DOB 03/06/1991, is a patient of my neurology clinic who was last seen 07/29/2023.  From a neurologic standing, patient is cleared for upper endoscopy and colonoscopy. However he should take his antiseizure medications (Levetiracetam and Oxcarbazepine) the morning of the procedure with water and then resume as soon as possible after the procedure.    If you have any questions, or concerns, please don't hesitate to call.         Sincerely,       Dr Teresa Coombs

## 2023-11-11 ENCOUNTER — Encounter: Payer: MEDICAID | Admitting: Gastroenterology

## 2023-11-11 ENCOUNTER — Other Ambulatory Visit: Payer: Self-pay

## 2023-11-11 ENCOUNTER — Other Ambulatory Visit: Payer: Self-pay | Admitting: Neurology

## 2023-11-11 DIAGNOSIS — R1310 Dysphagia, unspecified: Secondary | ICD-10-CM

## 2023-11-11 DIAGNOSIS — K219 Gastro-esophageal reflux disease without esophagitis: Secondary | ICD-10-CM

## 2023-11-11 DIAGNOSIS — K625 Hemorrhage of anus and rectum: Secondary | ICD-10-CM

## 2023-11-11 DIAGNOSIS — R197 Diarrhea, unspecified: Secondary | ICD-10-CM

## 2023-11-11 DIAGNOSIS — R112 Nausea with vomiting, unspecified: Secondary | ICD-10-CM

## 2023-11-11 MED ORDER — ZONISAMIDE 100 MG PO CAPS: 200.0000 mg | ORAL_CAPSULE | Freq: Every day | ORAL | 5 refills | Status: DC

## 2023-11-11 NOTE — Telephone Encounter (Signed)
Pt was notified that we have received neurology clearance and that he was approved to have the EGD and Colonoscopy here in the [facility].  Pt was scheduled an EGD/Colonoscopy on 11/28/2023 at 8:00 AM  with Dr. Lavon Paganini. Ambulatory referral to GI placed in EP[facility]. Updated prep instructions sent to pt via my chart. Pt made aware.  Pt notified  he should take his antiseizure medications (Levetiracetam and Oxcarbazepine) the morning of the procedure with water and then resume as soon as possible after the procedure.  Pt verbalized understanding with all questions answered.

## 2023-11-11 NOTE — Telephone Encounter (Signed)
Spoke to pt.  Documented in phone note. Pt verbalized understanding with all questions answered.

## 2023-11-11 NOTE — Telephone Encounter (Signed)
John Vaughan, pls review case and verify if patient requires EGD/colonoscopy in the hospital setting or if he is an appropriate candidate for [facility].  Refer to nephrology note 10/28/2023 which documented seizure activity since I saw patient in office "He is still having seizures at least once a week where he has a minute or two of staring off and then coming too and not remembering the seizure or the day before".  Neuro clearance was therefore requested and was promptly received as noted below.

## 2023-11-11 NOTE — Telephone Encounter (Signed)
Yes, he can still have colonoscopy, they can send another clearance form if they like but yes, he can still have colonoscopy

## 2023-11-11 NOTE — Telephone Encounter (Signed)
John Vaughan, pls contact patient and let him know that we received neurology clearance and patient was approved to have EGD and colonoscopy done in [facility]. Pls schedule him for an EGD and colonoscopy with Dr. Lavon Paganini in [facility]. Pls provide patient with instructions regarding his seizure medications day of procedure as noted in the neuro clearance letter. THX.

## 2023-11-20 ENCOUNTER — Encounter: Payer: Self-pay | Admitting: Neurology

## 2023-11-27 ENCOUNTER — Encounter: Payer: Self-pay | Admitting: Certified Registered Nurse Anesthetist

## 2023-11-28 ENCOUNTER — Encounter: Payer: Self-pay | Admitting: Gastroenterology

## 2023-11-28 ENCOUNTER — Ambulatory Visit (AMBULATORY_SURGERY_CENTER): Payer: No Typology Code available for payment source | Admitting: Gastroenterology

## 2023-11-28 VITALS — BP 104/69 | HR 71 | Temp 98.1°F | Resp 13 | Ht 70.0 in | Wt 227.0 lb

## 2023-11-28 DIAGNOSIS — K625 Hemorrhage of anus and rectum: Secondary | ICD-10-CM

## 2023-11-28 DIAGNOSIS — K648 Other hemorrhoids: Secondary | ICD-10-CM

## 2023-11-28 DIAGNOSIS — K222 Esophageal obstruction: Secondary | ICD-10-CM | POA: Diagnosis not present

## 2023-11-28 DIAGNOSIS — K219 Gastro-esophageal reflux disease without esophagitis: Secondary | ICD-10-CM

## 2023-11-28 DIAGNOSIS — R197 Diarrhea, unspecified: Secondary | ICD-10-CM

## 2023-11-28 DIAGNOSIS — R1310 Dysphagia, unspecified: Secondary | ICD-10-CM

## 2023-11-28 DIAGNOSIS — K449 Diaphragmatic hernia without obstruction or gangrene: Secondary | ICD-10-CM

## 2023-11-28 DIAGNOSIS — K2100 Gastro-esophageal reflux disease with esophagitis, without bleeding: Secondary | ICD-10-CM | POA: Diagnosis not present

## 2023-11-28 HISTORY — PX: UPPER GASTROINTESTINAL ENDOSCOPY: SHX188

## 2023-11-28 HISTORY — PX: COLONOSCOPY: SHX174

## 2023-11-28 MED ORDER — SODIUM CHLORIDE 0.9 % IV SOLN: 500.0000 mL | Freq: Once | INTRAVENOUS | Status: DC

## 2023-11-28 NOTE — Progress Notes (Signed)
No dilation diet needed per Dr. Lavon Paganini

## 2023-11-28 NOTE — Progress Notes (Signed)
0757 Robinul 0.1 mg IV given due large amount of secretions upon assessment.  MD made aware, vss 

## 2023-11-28 NOTE — Op Note (Signed)
[facility] Patient Name: John Vaughan Procedure Date: 11/28/2023 7:18 AM MRN: [id number] Endoscopist: Napoleon Form , MD, [id number] Age: 32 Referring MD:  Date of Birth: 11-01-1991 Gender: Male Account #: [id number] Procedure:                Colonoscopy Indications:              Evaluation of unexplained GI bleeding presenting                            with Hematochezia, Clinically significant diarrhea                            of unexplained origin Medicines:                Monitored Anesthesia Care Procedure:                Pre-Anesthesia Assessment:                           - Prior to the procedure, a History and Physical                            was performed, and patient medications and                            allergies were reviewed. The patient's tolerance of                            previous anesthesia was also reviewed. The risks                            and benefits of the procedure and the sedation                            options and risks were discussed with the patient.                            All questions were answered, and informed consent                            was obtained. Prior Anticoagulants: The patient has                            taken no anticoagulant or antiplatelet agents. ASA                            Grade Assessment: III - A patient with severe                            systemic disease. After reviewing the risks and                            benefits, the patient was deemed in satisfactory  condition to undergo the procedure.                           After obtaining informed consent, the colonoscope                            was passed under direct vision. Throughout the                            procedure, the patient's blood pressure, pulse, and                            oxygen saturations were monitored continuously. The                            [id number]-[id number] Colonoscope  [id number] was introduced                            through the anus and advanced to the the cecum,                            identified by appendiceal orifice and ileocecal                            valve. The colonoscopy was performed without                            difficulty. The patient tolerated the procedure                            well. The quality of the bowel preparation was                            good. The terminal ileum, ileocecal valve,                            appendiceal orifice, and rectum were photographed. Scope In: 8:25:53 AM Scope Out: 8:37:39 AM Scope Withdrawal Time: 0 hours 7 minutes 21 seconds  Total Procedure Duration: 0 hours 11 minutes 46 seconds  Findings:                 The perianal and digital rectal examinations were                            normal.                           The terminal ileum appeared normal.                           Normal mucosa was found in the entire colon.                            Biopsies for histology were taken with a cold  forceps from the entire colon for evaluation of                            microscopic colitis.                           Non-bleeding internal hemorrhoids were found during                            retroflexion. The hemorrhoids were small.                           The exam was otherwise without abnormality. Complications:            No immediate complications. Estimated Blood Loss:     Estimated blood loss was minimal. Impression:               - The examined portion of the ileum was normal.                           - Normal mucosa in the entire examined colon.                            Biopsied.                           - Non-bleeding internal hemorrhoids.                           - The examination was otherwise normal. Recommendation:           - Patient has a contact number available for                            emergencies. The signs and symptoms of  potential                            delayed complications were discussed with the                            patient. Return to normal activities tomorrow.                            Written discharge instructions were provided to the                            patient.                           - Resume previous diet.                           - Continue present medications.                           - Await pathology results.                           -  Repeat colonoscopy at age 23 for surveillance. Napoleon Form, MD 11/28/2023 8:50:51 AM This report has been signed electronically.

## 2023-11-28 NOTE — Progress Notes (Signed)
Gastroenterology History and Physical   Primary Care Physician:  Cyril Mourning, FNP   Reason for Procedure:  GERD, rectal bleeding, dysphagia and diarrhea  Plan:    EGD and colonoscopy with possible interventions as needed     HPI: John Vaughan is a very pleasant 32 y.o. male here for EGD and colonoscopy for GERD, rectal bleeding, dysphagia and diarrhea.   The risks and benefits as well as alternatives of endoscopic procedure(s) have been discussed and reviewed. All questions answered. The patient agrees to proceed.    Past Medical History:  Diagnosis Date   ADHD    Anxiety    Autism    Bipolar 2 disorder (HCC)    Depression    GERD (gastroesophageal reflux disease)    HLD (hyperlipidemia)    HTN (hypertension)    Neuropathy    Schizophrenia (HCC)    Seizures (HCC)    Sleep apnea    does not use cpap   Substance abuse (HCC)    alcohol    Past Surgical History:  Procedure Laterality Date   COLONOSCOPY     HAND SURGERY Right    TYMPANOSTOMY TUBE PLACEMENT     UPPER GASTROINTESTINAL ENDOSCOPY     WISDOM TOOTH EXTRACTION      Prior to Admission medications   Medication Sig Start Date End Date Taking? Authorizing Provider  ARIPiprazole (ABILIFY) 5 MG tablet Take 1 tablet (5 mg total) by mouth daily. 07/27/23 11/28/23 Yes Massengill, Harrold Donath, MD  busPIRone (BUSPAR) 10 MG tablet Take 10 mg by mouth daily. 10/01/23  Yes [provider]  gabapentin (NEURONTIN) 300 MG capsule Take 2 capsules (600 mg total) by mouth 3 (three) times daily. 07/26/23 11/28/23 Yes Massengill, Harrold Donath, MD  ondansetron (ZOFRAN-ODT) 8 MG disintegrating tablet Take 8 mg by mouth every 8 (eight) hours as needed. 08/14/23  Yes [provider]  oxcarbazepine (TRILEPTAL) 600 MG tablet Take 1.5 tablets (900 mg total) by mouth 2 (two) times daily. 10/07/23 05/04/24 Yes Windell Norfolk, MD  traZODone (DESYREL) 150 MG tablet Take 1 tablet (150 mg total) by mouth at bedtime as  needed for sleep. 07/26/23 11/28/23 Yes Massengill, Harrold Donath, MD  vortioxetine HBr (TRINTELLIX) 10 MG TABS tablet Take 10 mg by mouth daily.   Yes [provider]  zonisamide (ZONEGRAN) 100 MG capsule Take 2 capsules (200 mg total) by mouth at bedtime. 11/11/23 05/09/24 Yes Camara, Amalia Hailey, MD  hydrOXYzine (ATARAX) 50 MG tablet Take 50 mg by mouth as needed. 10/01/23   [provider]  nicotine (NICODERM CQ - DOSED IN MG/24 HOURS) 21 mg/24hr patch Place 21 mg onto the skin daily. Patient not taking: Reported on 11/28/2023 11/27/23   [provider]  pantoprazole (PROTONIX) 40 MG tablet Take 1 tablet (40 mg total) by mouth 2 (two) times daily before a meal. Take 30 minutes before breakfast and dinner 10/14/23   Arnaldo Natal, NP    Current Outpatient Medications  Medication Sig Dispense Refill   ARIPiprazole (ABILIFY) 5 MG tablet Take 1 tablet (5 mg total) by mouth daily. 30 tablet 0   busPIRone (BUSPAR) 10 MG tablet Take 10 mg by mouth daily.     gabapentin (NEURONTIN) 300 MG capsule Take 2 capsules (600 mg total) by mouth 3 (three) times daily. 180 capsule 0   ondansetron (ZOFRAN-ODT) 8 MG disintegrating tablet Take 8 mg by mouth every 8 (eight) hours as needed.     oxcarbazepine (TRILEPTAL) 600 MG tablet Take 1.5 tablets (900  mg total) by mouth 2 (two) times daily. 90 tablet 6   traZODone (DESYREL) 150 MG tablet Take 1 tablet (150 mg total) by mouth at bedtime as needed for sleep. 30 tablet 0   vortioxetine HBr (TRINTELLIX) 10 MG TABS tablet Take 10 mg by mouth daily.     zonisamide (ZONEGRAN) 100 MG capsule Take 2 capsules (200 mg total) by mouth at bedtime. 60 capsule 5   hydrOXYzine (ATARAX) 50 MG tablet Take 50 mg by mouth as needed.     nicotine (NICODERM CQ - DOSED IN MG/24 HOURS) 21 mg/24hr patch Place 21 mg onto the skin daily. (Patient not taking: Reported on 11/28/2023)     pantoprazole (PROTONIX) 40 MG tablet Take 1 tablet (40 mg total) by mouth 2 (two)  times daily before a meal. Take 30 minutes before breakfast and dinner 60 tablet 1   Current Facility-Administered Medications  Medication Dose Route Frequency Provider Last Rate Last Admin   0.9 %  sodium chloride infusion  500 mL Intravenous Once Napoleon Form, MD        Allergies as of 11/28/2023 - Review Complete 11/28/2023  Allergen Reaction Noted   Food Diarrhea, Nausea And Vomiting, and Rash 07/21/2023   Chicken allergy Hives and Diarrhea 07/21/2023   Fruit extracts Diarrhea 07/21/2023    Family History  Problem Relation Age of Onset   Anxiety disorder Mother    Interstitial cystitis Mother    COPD Mother    Heart disease Mother    Alcohol abuse Father    COPD Father    Seizures Father    Heart disease Father    Anxiety disorder Maternal Aunt    Anxiety disorder Maternal Grandmother    Diabetes Maternal Grandmother    Heart disease Maternal Grandfather    Diabetes Paternal Grandmother    Alcohol abuse Paternal Grandfather    Heart disease Paternal Grandfather    Autism Son    ADD / ADHD Son    Colon cancer Neg Hx    Esophageal cancer Neg Hx    Rectal cancer Neg Hx    Stomach cancer Neg Hx     Social History   Socioeconomic History   Marital status: Significant Other    Spouse name: elliott   Number of children: 1   Years of education: 12   Highest education level: 12th grade  Occupational History   Not on file  Tobacco Use   Smoking status: Every Day    Current packs/day: 1.00    Average packs/day: 0.8 packs/day for 30.4 years (22.9 ttl pk-yrs)    Types: Cigarettes    Start date: 07/20/2008   Smokeless tobacco: Former    Types: Snuff    Quit date: 07/21/2023  Vaping Use   Vaping status: Never Used  Substance and Sexual Activity   Alcohol use: Not Currently    Alcohol/week: 28.0 standard drinks of alcohol    Types: 28 Cans of beer per week    Comment: quit Sept 10 2024   Drug use: Not Currently    Types: Cocaine    Comment: last used  cocaine 4 months ago, usually one gram used weekly   Sexual activity: Yes  Other Topics Concern   Not on file  Social History Narrative   Right Handed    No Caffeine Use    Social Determinants of Health   Financial Resource Strain: High Risk (10/03/2023)   Received from [facility]   Overall Financial Resource Strain (CARDIA)  Difficulty of Paying Living Expenses: Very hard  Food Insecurity: Food Insecurity Present (10/03/2023)   Received from [facility]   Hunger Vital Sign    Worried About Running Out of Food in the Last Year: Often true    Ran Out of Food in the Last Year: Often true  Transportation Needs: Unmet Transportation Needs (10/03/2023)   Received from [facility] - Transportation    Lack of Transportation (Medical): Yes    Lack of Transportation (Non-Medical): Yes  Physical Activity: Insufficiently Active (10/03/2023)   Received [facility]   Exercise Vital Sign    Days of Exercise per Week: 1 day    Minutes of Exercise per Session: 10 min  Stress: Stress Concern Present (10/03/2023)   Received from [facility] - Occupational Stress Questionnaire    Feeling of Stress : Rather much  Social Connections: Socially Isolated (10/03/2023)   Received from [facility]   Social Network    How would you rate your social network (family, work, friends)?: Little participation, lonely and socially isolated  Intimate Partner Violence: Not At Risk (10/03/2023)   Received from [facility]   HITS    Over the last 12 months how often did your partner physically hurt you?: Never    Over the last 12 months how often did your partner insult you or talk down to you?: Never    Over the last 12 months how often did your partner threaten you with physical harm?: Never    Over the last 12 months how often did your partner scream or curse at you?: Never    Review of Systems:  All other review of systems  negative except as mentioned in the HPI.  Physical Exam: Vital signs in last 24 hours: BP 104/80   Pulse (!) 53   Temp 98.1 F (36.7 C) (Temporal)   Resp 20   Ht 5\' 10"  (1.778 m)   Wt 227 lb (103 kg)   SpO2 100%   BMI 32.57 kg/m  General:   Alert, NAD Lungs:  Clear .   Heart:  Regular rate and rhythm Abdomen:  Soft, nontender and nondistended. Neuro/Psych:  Alert and cooperative. Normal mood and affect. A and O x 3  Reviewed labs, radiology imaging, old records and pertinent past GI work up  Patient is appropriate for planned procedure(s) and anesthesia in an ambulatory setting   K. Scherry Ran , MD [phone]

## 2023-11-28 NOTE — Progress Notes (Signed)
Report given to PACU, vss 

## 2023-11-28 NOTE — Op Note (Signed)
[facility] Patient Name: John Vaughan Procedure Date: 11/28/2023 7:28 AM MRN: [id number] Endoscopist: Napoleon Form , MD, [id number] Age: 32 Referring MD:  Date of Birth: 10/31/1991 Gender: Male Account #: [id number] Procedure:                Upper GI endoscopy Indications:              Dysphagia, Esophageal reflux symptoms that persist                            despite appropriate therapy Medicines:                Monitored Anesthesia Care Procedure:                Pre-Anesthesia Assessment:                           - Prior to the procedure, a History and Physical                            was performed, and patient medications and                            allergies were reviewed. The patient's tolerance of                            previous anesthesia was also reviewed. The risks                            and benefits of the procedure and the sedation                            options and risks were discussed with the patient.                            All questions were answered, and informed consent                            was obtained. Prior Anticoagulants: The patient has                            taken no anticoagulant or antiplatelet agents. ASA                            Grade Assessment: III - A patient with severe                            systemic disease. After reviewing the risks and                            benefits, the patient was deemed in satisfactory                            condition to undergo the procedure.  After obtaining informed consent, the endoscope was                            passed under direct vision. Throughout the                            procedure, the patient's blood pressure, pulse, and                            oxygen saturations were monitored continuously. The                            Olympus Scope [id number] was introduced through the                            mouth, and  advanced to the second part of duodenum.                            The upper GI endoscopy was accomplished without                            difficulty. The patient tolerated the procedure                            well. Scope In: Scope Out: Findings:                 One benign-appearing, intrinsic mild stenosis was                            found 39 to 40 cm from the incisors. This stenosis                            measured 1.8 cm (inner diameter) x less than one cm                            (in length). The stenosis was traversed. The scope                            was withdrawn. Dilation was performed with a                            Maloney dilator with no resistance at 48 Fr.                           LA Grade A (one or more mucosal breaks less than 5                            mm, not extending between tops of 2 mucosal folds)                            esophagitis with no bleeding was found 35 to 40 cm  from the incisors. Biopsies were obtained from the                            proximal and distal esophagus with cold forceps for                            histology of suspected eosinophilic esophagitis.                           A 2 cm hiatal hernia was present.                           The stomach was normal.                           The cardia and gastric fundus were normal on                            retroflexion.                           The examined duodenum was normal. Complications:            No immediate complications. Estimated Blood Loss:     Estimated blood loss was minimal. Impression:               - Benign-appearing esophageal stenosis. Dilated.                           - LA Grade A reflux esophagitis with no bleeding.                           - 2 cm hiatal hernia.                           - Normal stomach.                           - Normal examined duodenum.                           - Biopsies were taken with a cold  forceps for                            evaluation of eosinophilic esophagitis. Recommendation:           - Patient has a contact number available for                            emergencies. The signs and symptoms of potential                            delayed complications were discussed with the                            patient. Return to normal activities tomorrow.  Written discharge instructions were provided to the                            patient.                           - Resume previous diet.                           - Continue present medications.                           - Await pathology results. Napoleon Form, MD 11/28/2023 8:53:14 AM This report has been signed electronically.

## 2023-11-28 NOTE — Patient Instructions (Addendum)
Continue present medications. Await pathology results. Repeat colonoscopy at age 32 for surveillance.  Please read over handouts provided  YOU HAD AN ENDOSCOPIC PROCEDURE TODAY AT THE [facility]:   Refer to the procedure report that was given to you for any specific questions about what was found during the examination.  If the procedure report does not answer your questions, please call your gastroenterologist to clarify.  If you requested that your care partner not be given the details of your procedure findings, then the procedure report has been included in a sealed envelope for you to review at your convenience later.  YOU SHOULD EXPECT: Some feelings of bloating in the abdomen. Passage of more gas than usual.  Walking can help get rid of the air that was put into your GI tract during the procedure and reduce the bloating. If you had a lower endoscopy (such as a colonoscopy or flexible sigmoidoscopy) you may notice spotting of blood in your stool or on the toilet paper. If you underwent a bowel prep for your procedure, you may not have a normal bowel movement for a few days.  Please Note:  You might notice some irritation and congestion in your nose or some drainage.  This is from the oxygen used during your procedure.  There is no need for concern and it should clear up in a day or so.  SYMPTOMS TO REPORT IMMEDIATELY:  Following lower endoscopy (colonoscopy or flexible sigmoidoscopy):  Excessive amounts of blood in the stool  Significant tenderness or worsening of abdominal pains  Swelling of the abdomen that is new, acute  Fever of 100F or higher  Following upper endoscopy (EGD)  Vomiting of blood or coffee ground material  New chest pain or pain under the shoulder blades  Painful or persistently difficult swallowing  New shortness of breath  Fever of 100F or higher  Black, tarry-looking stools  For urgent or emergent issues, a gastroenterologist can be reached at  any hour by calling [phone]. Do not use MyChart messaging for urgent concerns.    DIET:  We do recommend a small meal at first, but then you may proceed to your regular diet.  Drink plenty of fluids but you should avoid alcoholic beverages for 24 hours.  ACTIVITY:  You should plan to take it easy for the rest of today and you should NOT DRIVE or use heavy machinery until tomorrow (because of the sedation medicines used during the test).    FOLLOW UP: Our staff will call the number listed on your records the next business day following your procedure.  We will call around 7:15- 8:00 am to check on you and address any questions or concerns that you may have regarding the information given to you following your procedure. If we do not reach you, we will leave a message.     If any biopsies were taken you will be contacted by phone or by letter within the next 1-3 weeks.  Please call us at [phone] if you have not heard about the biopsies in 3 weeks.    SIGNATURES/CONFIDENTIALITY: You and/or your care partner have signed paperwork which will be entered into your electronic medical record.  These signatures attest to the fact that that the information above on your After Visit Summary has been reviewed and is understood.  Full responsibility of the confidentiality of this discharge information lies with you and/or your care-partner.

## 2023-11-28 NOTE — Progress Notes (Signed)
Called to room to assist during endoscopic procedure.  Patient ID and intended procedure confirmed with present staff. Received instructions for my participation in the procedure from the performing physician.  

## 2023-11-29 ENCOUNTER — Telehealth: Payer: Self-pay

## 2023-11-29 NOTE — Telephone Encounter (Signed)
FU call placed, VM obtained and message left.

## 2023-12-02 LAB — SURGICAL PATHOLOGY

## 2024-01-01 ENCOUNTER — Encounter: Payer: Self-pay | Admitting: Gastroenterology

## 2024-02-03 ENCOUNTER — Ambulatory Visit (INDEPENDENT_AMBULATORY_CARE_PROVIDER_SITE_OTHER): Payer: MEDICAID | Admitting: Neurology

## 2024-02-03 ENCOUNTER — Encounter: Payer: Self-pay | Admitting: Neurology

## 2024-02-03 VITALS — BP 119/83 | HR 83 | Ht 72.0 in | Wt 233.5 lb

## 2024-02-03 DIAGNOSIS — Z5181 Encounter for therapeutic drug level monitoring: Secondary | ICD-10-CM

## 2024-02-03 DIAGNOSIS — R569 Unspecified convulsions: Secondary | ICD-10-CM

## 2024-02-03 DIAGNOSIS — G629 Polyneuropathy, unspecified: Secondary | ICD-10-CM

## 2024-02-03 NOTE — Progress Notes (Signed)
 John Vaughan  PATIENT: John Vaughan DOB: August 20, 1991  REFERRING DOCTOR OR PCP: John Koyanagi, NP SOURCE: Patient, note from multiple emergency room visits, imaging and lab reports, MRI and CT personally reviewed.  _________________________________   HISTORICAL  CHIEF COMPLAINT:  Chief Complaint  Patient presents with   Follow-up    Rm12, girlfriend present, WC:BJSE one possibly 3 days ago and confirmed one 01/26/24(denied missing sz meds) but did miss trintellex. SZ is stress induced.  POLYNEUROPATHY: worse since hasn't taken gabapentin  makes fatigued /and worried its an addictive drug. He just refilled gabapentn and would like to try taking regularly to see if helps since cost prohibitive   INTERVAL HISTORY 02/03/2024:  Patient presents today for follow-up, last visit was in August, at that time we have planned to obtain an ambulatory EEG but it has not been done yet.  They tell me that he has been sober for the past 5 months, has not had any alcohol.  Girlfriend told me that he was doing better, stayed months without seizure but now he has been under lots of stress and currently he is having on average seizure once a week, his typical staring spells.  Sometimes he will have episodes where he would become stiff and has some convulsion.  They denies any tongue biting, denies any urinary incontinence and no injuries.    INTERVAL HISTORY 07/29/2023:  Patient presented for follow-up, he is accompanied by girlfriend. Last visit was last month with John Vaughan. Since then he had a EEG which which showed right frontal slowing. Trileptal  was added.  then he was recently admitted to the hospital for alcohol intoxication and suicidal ideation, discharged on July 28.  She reported being sober for the past past 9 days.  He has plan of discontinuing alcohol.  He does have a psychiatrist but has not joined any AA meeting.  Reports his last seizure was last Saturday, 3 days ago.   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  He is compliant with his Keppra  750 mg twice daily and Trileptal  300/600 mg.  He is also complaining of cognitive impairment . He is pending ambulatory EEG.    HISTORY OF PRESENT ILLNESS John Vaughan:  I had the pleasure seeing patient, John Vaughan, at [facility] for a neurologic consultation regarding his seizures.  He is a 33 year old man who has had seizures since age 33.  His first seizure was a convulsion and he started kicking while watching TV and was unresponsive x many minutes.   A second spell a little bu later was also a convulsion and he hit his nose breaking it.   More recently he has both staring spells and convulsions, often with the convulsion following the staring spell.  At baseline before a few months ago, he would have 2/month on average.     He has been to the emergency room multiple times in the last 6 months and carries a diagnosis of seizures, pseudoseizures and alcohol abuse.    He has had multiple spells with both staring spells and convulsions in June.  Some spells started with staring spells and then convulsions.    Last week, he had > 20 spells and went to the ED.    He does not have much recall of the day.   His most recent emergency room visit was on 06/18/2023 and he was loaded with Keppra  and then placed on an outpatient dose of 750 mg p.o.  twice daily.    He has been on Keppra  of/on x many years.   No seizure in last 3 day.     He has never had an EEG.  MRI 08/26/2022 shows mild atrophy.    He has a long history of alcohol abuse regularly drinking > 24 beers a day.   He started drinking in his mid teens.   He is currently not drinking.  Seizures occur both with moderate to heavy alcohol use and with withdrawal.     He has had some head trauma but never hospitalized for head injuries.    He never had meningitis or encephalitis.    He reached developmental milestones at the right age  according to his mom.   He has a long history of alcohol abuse and also has had suicide ideation.  He was in a psychiatric hospital in mid June for detox.  He reports numbness in his feet since 2018 or so.     Imaging: I personally reviewed the MRI of the brain from 08/26/2022.  It shows mild generalized cortical atrophy, more than would be expected at his age.  There was also mild cerebellar atrophy.  There were no acute findings.  CT scan of the head 06/24/2023 was unchanged   REVIEW OF SYSTEMS: Constitutional: No fevers, chills, sweats, or change in appetite Eyes: No visual changes, double vision, eye pain Ear, nose and throat: No hearing loss, ear pain, nasal congestion, sore throat Cardiovascular: No chest pain, palpitations Respiratory:  No shortness of breath at rest or with exertion.   No wheezes GastrointestinaI: No nausea, vomiting, diarrhea, abdominal pain, fecal incontinence Genitourinary:  No dysuria, urinary retention or frequency.  No nocturia. Musculoskeletal:  No neck pain, back pain Integumentary: No rash, pruritus, skin lesions Neurological: as above Psychiatric: No depression at this time.  No anxiety Endocrine: No palpitations, diaphoresis, change in appetite, change in weigh or increased thirst Hematologic/Lymphatic:  No anemia, purpura, petechiae. Allergic/Immunologic: No itchy/runny eyes, nasal congestion, recent allergic reactions, rashes  ALLERGIES: Allergies  Allergen Reactions   Food Diarrhea, Nausea And Vomiting and Rash    Nuts   Chicken Allergy Hives and Diarrhea   Fruit Extracts Diarrhea    HOME MEDICATIONS:  Current Outpatient Medications:    ARIPiprazole  (ABILIFY ) 5 MG tablet, Take 1 tablet (5 mg total) by mouth daily., Disp: 30 tablet, Rfl: 0   busPIRone  (BUSPAR ) 10 MG tablet, Take 10 mg by mouth daily., Disp: , Rfl:    gabapentin  (NEURONTIN ) 600 MG tablet, Take 600 mg by mouth 3 (three) times daily., Disp: , Rfl:    hydrOXYzine  (ATARAX ) 50  MG tablet, Take 50 mg by mouth as needed., Disp: , Rfl:    nicotine  (NICODERM CQ  - DOSED IN MG/24 HOURS) 21 mg/24hr patch, Place 21 mg onto the skin daily., Disp: , Rfl:    ondansetron  (ZOFRAN -ODT) 8 MG disintegrating tablet, Take 8 mg by mouth every 8 (eight) hours as needed., Disp: , Rfl:    oxcarbazepine  (TRILEPTAL ) 600 MG tablet, Take 1.5 tablets (900 mg total) by mouth 2 (two) times daily., Disp: 90 tablet, Rfl: 6   pantoprazole  (PROTONIX ) 40 MG tablet, Take 1 tablet (40 mg total) by mouth 2 (two) times daily before a meal. Take 30 minutes before breakfast and dinner, Disp: 60 tablet, Rfl: 1   traZODone  (DESYREL ) 150 MG tablet, Take 1 tablet (150 mg total) by mouth at bedtime as needed for sleep., Disp: 30 tablet, Rfl: 0   vortioxetine HBr (TRINTELLIX)  10 MG TABS tablet, Take 10 mg by mouth daily., Disp: , Rfl:    zonisamide  (ZONEGRAN ) 100 MG capsule, Take 2 capsules (200 mg total) by mouth at bedtime., Disp: 60 capsule, Rfl: 5  PAST MEDICAL HISTORY: 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    PAST SURGICAL HISTORY: Past Surgical History:  Procedure Laterality Date   COLONOSCOPY  11/28/2023   HAND SURGERY Right    TYMPANOSTOMY TUBE PLACEMENT     UPPER GASTROINTESTINAL ENDOSCOPY  11/28/2023   WISDOM TOOTH EXTRACTION      FAMILY HISTORY: Family History  Problem Relation Age of Onset   Anxiety disorder Mother    Interstitial cystitis Mother    COPD Mother    Heart disease Mother    Alcohol abuse Father    COPD Father    Seizures Father    Heart disease Father    Anxiety disorder Maternal Aunt    Anxiety disorder Maternal Grandmother    Diabetes Maternal Grandmother    Heart  disease Maternal Grandfather    Diabetes Paternal Grandmother    Alcohol abuse Paternal Grandfather    Heart disease Paternal Grandfather    Autism Son    ADD / ADHD Son    Colon cancer Neg Hx    Esophageal cancer Neg Hx    Rectal cancer Neg Hx    Stomach cancer Neg Hx     SOCIAL HISTORY: Social History   Socioeconomic History   Marital status: Significant Other    Spouse name: elliott   Number of children: 1   Years of education: 12   Highest education level: 12th grade  Occupational History   Not on file  Tobacco Use   Smoking status: Every Day    Current packs/day: 1.00    Average packs/day: 0.8 packs/day for 30.5 years (23.0 ttl pk-yrs)    Types: Cigarettes    Start date: 07/20/2008   Smokeless tobacco: Former    Types: Snuff    Quit date: 07/21/2023  Vaping Use   Vaping status: Never Used  Substance and Sexual Activity   Alcohol use: Not Currently    Alcohol/week: 28.0 standard drinks of alcohol    Types: 28 Cans of beer per week    Comment: quit Sept 10 2024   Drug use: Not Currently    Types: Cocaine    Comment: last used cocaine 4 months 06/2023 ago, usually one gram used weekly   Sexual activity: Yes  Other Topics Concern   Not on file  Social History Narrative   Right Handed    No Caffeine Use    Social Drivers of Health   Financial Resource Strain: High Risk (10/03/2023)   Received from [facility]   Overall Financial Resource Strain (CARDIA)    Difficulty of Paying Living Expenses: Very hard  Food Insecurity: Food Insecurity Present (10/03/2023)   Received from [facility]   Hunger Vital Sign    Worried About Running Out of Food in the Last Year: Often true    Ran Out of Food in the Last Year:  Often true  Transportation Needs: Unmet Transportation Needs (10/03/2023)   Received from [facility] - Transportation    Lack of Transportation (Medical): Yes    Lack of Transportation (Non-Medical): Yes  Physical Activity:  Insufficiently Active (10/03/2023)   Received from [facility]   Exercise Vital Sign    Days of Exercise per Week: 1 day    Minutes of Exercise per Session: 10 min  Stress: Stress Concern Present (10/03/2023)   Received from [facility] - Occupational Stress Questionnaire    Feeling of Stress : Rather much  Social Connections: Socially Isolated (10/03/2023)   Received from [facility]   Social Network    How would you rate your social network (family, work, friends)?: Little participation, lonely and socially isolated  Intimate Partner Violence: Not At Risk (10/03/2023)   Received from [facility]   HITS    Over the last 12 months how often did your partner physically hurt you?: Never    Over the last 12 months how often did your partner insult you or talk down to you?: Never    Over the last 12 months how often did your partner threaten you with physical harm?: Never    Over the last 12 months how often did your partner scream or curse at you?: Never       PHYSICAL EXAM  Vitals:   02/03/24 1126  BP: 119/83  Pulse: 83  Weight: 233 lb 8 oz (105.9 kg)  Height: 6' (1.829 m)    Body mass index is 31.67 kg/m.   General: The patient is well-developed and well-nourished and in no acute distress  HEENT:  Head is /AT.  Sclera are anicteric.   Neck: No carotid bruits are noted.  The neck is nontender.  Skin: Extremities are without rash or  edema.  Musculoskeletal:  Back is nontender  Neurologic Exam  Mental status: The patient is alert and oriented x 3 at the time of the examination. The patient has apparent normal recent and remote memory, with an apparently normal attention span and concentration ability.   Speech is normal.  Cranial nerves: Extraocular movements are full. Pupils are equal, round, and reactive to light and accomodation.    He reports decreased sensation to temperature on hte left.  He reports less  vibration sensation over frontal bone on the left (usually functional)/   Facial strength is normal.  Trapezius and sternocleidomastoid strength is normal. No dysarthria is noted.  The tongue is midline, and the patient has symmetric elevation of the soft palate. No obvious hearing deficits are noted.  Motor:  Muscle bulk is normal.   Tone is normal. Strength is  5 / 5 in all 4 extremities.   Sensory: Sensory testing is intact to pinprick, soft touch and vibration sensation in the arms but reduced sensation to vibration at the ankles and near absent vibration sensation at the toes.    Coordination: Cerebellar testing reveals good finger-nose-finger and heel-to-shin bilaterally.  Gait and station: Station is normal.   Gait is normal. Tandem gait is wide for age. Romberg is negative.   Reflexes: Deep tendon reflexes are symmetric and normal bilaterally.   Plantar responses are flexor.    DIAGNOSTIC DATA (LABS, IMAGING, TESTING) - I reviewed patient records, labs, notes, testing and imaging myself where available.  Lab Results  Component Value Date   WBC 5.6 10/14/2023   HGB 13.8 10/14/2023   HCT 41.2  10/14/2023   MCV 98.2 10/14/2023   PLT 297.0 10/14/2023      Component Value Date/Time   NA 144 10/14/2023 1515   K 4.2 10/14/2023 1515   CL 108 10/14/2023 1515   CO2 25 10/14/2023 1515   GLUCOSE 99 10/14/2023 1515   BUN 17 10/14/2023 1515   CREATININE 1.26 10/14/2023 1515   CALCIUM 9.5 10/14/2023 1515   PROT 6.5 10/14/2023 1515   ALBUMIN 4.4 10/14/2023 1515   AST 18 10/14/2023 1515   ALT 21 10/14/2023 1515   ALKPHOS 44 10/14/2023 1515   BILITOT 0.3 10/14/2023 1515   GFRNONAA >60 08/25/2023 0045   GFRAA >60 09/05/2020 1200   Lab Results  Component Value Date   CHOL 199 03/28/2023   HDL 64 03/28/2023   LDLCALC 104 (H) 03/28/2023   TRIG 157 (H) 03/28/2023   CHOLHDL 3.1 03/28/2023   Lab Results  Component Value Date   HGBA1C 5.6 03/28/2023   Lab Results  Component  Value Date   VITAMINB12 648 06/26/2023   Lab Results  Component Value Date   TSH 1.641 03/28/2023       ASSESSMENT AND PLAN  Seizure-like activity (HCC) - Plan: 10-Hydroxycarbazepine, Zonisamide  level, CMP  Therapeutic drug monitoring - Plan: 10-Hydroxycarbazepine, Zonisamide  level, CMP  Polyneuropathy  He is a 33 year old man with a history of seizures, non epileptic events and polysubstance abuse in remission.  MRI does show some atrophy though unclear if this was congenital or due to the extensive substance/alcohol abuse.  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  Most recent EEG with right frontal slowing, plan to obtain 72 hours ambulatory EEG. Plan for now is to continue current medications, Zonisamide  200 mg nightly and Trileptal  900 mg twice daily. Will obtain antiseizure medication levels today. I will contact him to go over the results.  For his Neuropathy, advise him to continue with Gabapentin  300 mg three time daily and to also consider adding Vitamin B complex, alpha lipoic acid.    Patient Instructions  Continue current medications including Oxcarbazepine  900 mg twice daily and Zonisamide  200 mg nightly  Will check labs today  3 days ambulatory EEG  Consider Vitamin B complex, Alpha lipoic acid for the peripheral neuropathy  Return in 6 months or sooner if worse       Cassandra Cleveland, MD 02/03/2024, 12:18 PM  [facility] [address] [place], [place] [address] [phone]

## 2024-02-03 NOTE — Patient Instructions (Addendum)
 Continue current medications including Oxcarbazepine  900 mg twice daily and Zonisamide  200 mg nightly  Will check labs today  3 days ambulatory EEG  Consider Vitamin B complex, Alpha lipoic acid for the peripheral neuropathy  Return in 6 months or sooner if worse

## 2024-02-04 ENCOUNTER — Telehealth: Payer: Self-pay

## 2024-02-07 ENCOUNTER — Other Ambulatory Visit: Payer: Self-pay | Admitting: Neurology

## 2024-02-07 ENCOUNTER — Encounter: Payer: Self-pay | Admitting: Neurology

## 2024-02-07 LAB — COMPREHENSIVE METABOLIC PANEL
ALT: 27 [IU]/L (ref 0–44)
AST: 19 [IU]/L (ref 0–40)
Albumin: 4.3 g/dL (ref 4.3–5.2)
Alkaline Phosphatase: 65 [IU]/L (ref 44–121)
BUN/Creatinine Ratio: 11 (ref 9–20)
BUN: 13 mg/dL (ref 6–20)
Bilirubin Total: <0.2 mg/dL (ref 0.0–1.2)
CO2: 19 mmol/L — ABNORMAL LOW (ref 20–29)
Calcium: 9.6 mg/dL (ref 8.7–10.2)
Chloride: 105 mmol/L (ref 96–106)
Creatinine, Ser: 1.17 mg/dL (ref 0.76–1.27)
Globulin, Total: 1.8 g/dL (ref 1.5–4.5)
Glucose: 104 mg/dL — ABNORMAL HIGH (ref 70–99)
Potassium: 4.5 mmol/L (ref 3.5–5.2)
Sodium: 139 mmol/L (ref 134–144)
Total Protein: 6.1 g/dL (ref 6.0–8.5)
eGFR: 86 mL/min/{1.73_m2} (ref >59)

## 2024-02-07 LAB — 10-HYDROXYCARBAZEPINE: Oxcarbazepine SerPl-Mcnc: 34 ug/mL (ref 10–35)

## 2024-02-07 LAB — ZONISAMIDE LEVEL: Zonisamide: 3.5 ug/mL — ABNORMAL LOW (ref 10.0–40.0)

## 2024-02-07 MED ORDER — ZONISAMIDE 100 MG PO CAPS: 300.0000 mg | ORAL_CAPSULE | Freq: Every day | ORAL | 0 refills | Status: DC

## 2024-02-10 ENCOUNTER — Telehealth: Payer: Self-pay

## 2024-02-10 NOTE — Telephone Encounter (Signed)
Call to pharmacy. Spoke with Zella Ball, cancelled old zonegran script.

## 2024-02-13 ENCOUNTER — Emergency Department (HOSPITAL_COMMUNITY): Payer: MEDICAID

## 2024-02-13 ENCOUNTER — Emergency Department (HOSPITAL_COMMUNITY)
Admission: EM | Admit: 2024-02-13 | Discharge: 2024-02-13 | Disposition: A | Discharge status: Home / Self Care | Payer: MEDICAID

## 2024-02-13 ENCOUNTER — Encounter (HOSPITAL_COMMUNITY): Payer: Self-pay | Admitting: Emergency Medicine

## 2024-02-13 DIAGNOSIS — Y9301 Activity, walking, marching and hiking: Secondary | ICD-10-CM | POA: Diagnosis not present

## 2024-02-13 DIAGNOSIS — S9032XA Contusion of left foot, initial encounter: Secondary | ICD-10-CM | POA: Insufficient documentation

## 2024-02-13 DIAGNOSIS — W010XXA Fall on same level from slipping, tripping and stumbling without subsequent striking against object, initial encounter: Secondary | ICD-10-CM | POA: Insufficient documentation

## 2024-02-13 DIAGNOSIS — S93402A Sprain of unspecified ligament of left ankle, initial encounter: Secondary | ICD-10-CM | POA: Diagnosis not present

## 2024-02-13 DIAGNOSIS — S99912A Unspecified injury of left ankle, initial encounter: Secondary | ICD-10-CM | POA: Diagnosis present

## 2024-02-13 MED ORDER — IBUPROFEN 600 MG PO TABS: 600.0000 mg | ORAL_TABLET | Freq: Four times a day (QID) | ORAL | 0 refills | Status: DC | PRN

## 2024-02-13 MED ORDER — CYCLOBENZAPRINE HCL 10 MG PO TABS: 10.0000 mg | ORAL_TABLET | Freq: Two times a day (BID) | ORAL | 0 refills | Status: DC | PRN

## 2024-02-13 NOTE — Discharge Instructions (Addendum)
You have suffered a left ankle sprain.  Wear ankle brace for support.  Take ibuprofen and Flexeril as needed for aches and pain but be aware that anti-inflammatory medication may increase the risk of stomach bleed or GI upset.  You should take it with food or take Tylenol instead.  Follow-up with orthopedic specialist as needed.

## 2024-02-13 NOTE — Progress Notes (Signed)
Orthopedic Tech Progress Note Patient Details:  Natividad Halls May 08, 1991 [id number]  Ortho Devices Type of Ortho Device: ASO, Crutches Ortho Device/Splint Location: left aso applied. crutches sized and instructed on use Ortho Device/Splint Interventions: Ordered, Application, Adjustment   Post Interventions Patient Tolerated: Well Instructions Provided: Adjustment of device, Care of device  Kizzie Fantasia 02/13/2024, 5:38 PM

## 2024-02-13 NOTE — ED Triage Notes (Signed)
Pt here from home with c/o left ankle pain after a fall last sat , states some swelling and bruising noted

## 2024-02-13 NOTE — ED Provider Notes (Signed)
[place] [facility] AT [facility] Provider Note   CSN: [id number] Arrival date & time: 02/13/24  1245     History  Chief Complaint  Patient presents with   Ankle Injury    John Vaughan is a 33 y.o. male.  The history is provided by the patient and medical records. No language interpreter was used.  Ankle Injury     33 year old male with history of polysubstance use, bipolar, thrombocytopenia, presenting to the ER for evaluation of leg injury.  Patient reports 5 days ago he was walking in the rain and slipped and fell twisted his left ankle in the process.  He felt a pop.  He did land on his buttock but denies hitting his head or loss of consciousness.  He has been able to bear weight on his ankle but states it is very painful.  He has noticed bruising and swelling to the affected area.  He tries using ice and elevation without adequate relief.  He mentioned has history of neuropathy from alcohol use but have been alcohol free for the past 5 months.  Denies any knee or hip pain.  Home Medications Prior to Admission medications   Medication Sig Start Date End Date Taking? Authorizing Provider  ARIPiprazole (ABILIFY) 5 MG tablet Take 1 tablet (5 mg total) by mouth daily. 07/27/23 02/03/36  Massengill, Harrold Donath, MD  busPIRone (BUSPAR) 10 MG tablet Take 10 mg by mouth daily. 10/01/23   [provider]  gabapentin (NEURONTIN) 600 MG tablet Take 600 mg by mouth 3 (three) times daily.    [provider]  hydrOXYzine (ATARAX) 50 MG tablet Take 50 mg by mouth as needed. 10/01/23   [provider]  nicotine (NICODERM CQ - DOSED IN MG/24 HOURS) 21 mg/24hr patch Place 21 mg onto the skin daily. 11/27/23   [provider]  ondansetron (ZOFRAN-ODT) 8 MG disintegrating tablet Take 8 mg by mouth every 8 (eight) hours as needed. 08/14/23   [provider]  oxcarbazepine (TRILEPTAL) 600 MG tablet Take 1.5 tablets (900 mg total) by  mouth 2 (two) times daily. 10/07/23 05/04/24  Windell Norfolk, MD  pantoprazole (PROTONIX) 40 MG tablet Take 1 tablet (40 mg total) by mouth 2 (two) times daily before a meal. Take 30 minutes before breakfast and dinner 10/14/23   Arnaldo Natal, NP  traZODone (DESYREL) 150 MG tablet Take 1 tablet (150 mg total) by mouth at bedtime as needed for sleep. 07/26/23 02/02/25  Massengill, Harrold Donath, MD  vortioxetine HBr (TRINTELLIX) 10 MG TABS tablet Take 10 mg by mouth daily.    [provider]  zonisamide (ZONEGRAN) 100 MG capsule Take 3 capsules (300 mg total) by mouth at bedtime. 02/07/24 08/05/24  Windell Norfolk, MD      Allergies    Food, Chicken allergy, and Fruit extracts    Review of Systems   Review of Systems  Musculoskeletal:  Positive for arthralgias.  Skin:  Negative for wound.    Physical Exam Updated Vital Signs BP (!) 133/92 (BP Location: Left Arm)   Pulse 77   Temp 98.2 F (36.8 C) (Oral)   Resp 15   Ht 6' (1.829 m)   Wt 104.3 kg   SpO2 100%   BMI 31.19 kg/m  Physical Exam Vitals and nursing note reviewed.  Constitutional:      General: He is not in acute distress.    Appearance: He is well-developed.  HENT:     Head: Atraumatic.  Eyes:  Conjunctiva/sclera: Conjunctivae normal.  Musculoskeletal:        General: Tenderness (Left ankle: Exquisite tenderness noted to the lateral malleolus region on palpation.  No crepitus noted.  No deformity.  Marked ecchymosis noted to the dorsum and lateral aspect of left foot, nontender to palpation) present.     Cervical back: Neck supple.     Comments: L knee nontender  Skin:    Capillary Refill: Capillary refill takes less than 2 seconds.     Findings: No rash.  Neurological:     Mental Status: He is alert.     ED Results / Procedures / Treatments   Labs (all labs ordered are listed, but only abnormal results are displayed) Labs Reviewed - No data to display  EKG None  Radiology DG Foot Complete  Left Result Date: 02/13/2024 CLINICAL DATA:  fall.  Left ankle pain.  Swelling and bruising. EXAM: LEFT FOOT - COMPLETE 3+ VIEW; LEFT KNEE - COMPLETE 4+ VIEW COMPARISON:  None Available. FINDINGS: No acute fracture or dislocation. No aggressive osseous lesion. No arthritis of the imaged joints. No knee effusion or focal soft tissue swelling. No radiopaque foreign bodies. IMPRESSION: *No acute osseous abnormality of the left foot or knee. Electronically Signed   By: Jules Schick M.D.   On: 02/13/2024 17:02   DG Knee Complete 4 Views Left Result Date: 02/13/2024 CLINICAL DATA:  fall.  Left ankle pain.  Swelling and bruising. EXAM: LEFT FOOT - COMPLETE 3+ VIEW; LEFT KNEE - COMPLETE 4+ VIEW COMPARISON:  None Available. FINDINGS: No acute fracture or dislocation. No aggressive osseous lesion. No arthritis of the imaged joints. No knee effusion or focal soft tissue swelling. No radiopaque foreign bodies. IMPRESSION: *No acute osseous abnormality of the left foot or knee. Electronically Signed   By: Jules Schick M.D.   On: 02/13/2024 17:02   DG Ankle Complete Left Result Date: 02/13/2024 CLINICAL DATA:  Left ankle pain after fall last Saturday. EXAM: LEFT ANKLE COMPLETE - 3+ VIEW COMPARISON:  None Available. FINDINGS: There is no evidence of fracture, dislocation, or joint effusion. There is no evidence of arthropathy or other focal bone abnormality. Soft tissues are unremarkable. IMPRESSION: Negative. Electronically Signed   By: Obie Dredge M.D.   On: 02/13/2024 13:30    Procedures Procedures    Medications Ordered in ED Medications - No data to display  ED Course/ Medical Decision Making/ A&P                                 Medical Decision Making Amount and/or Complexity of Data Reviewed Radiology: ordered.   BP (!) 133/92 (BP Location: Left Arm)   Pulse 77   Temp 98.2 F (36.8 C) (Oral)   Resp 15   Ht 6' (1.829 m)   Wt 104.3 kg   SpO2 100%   BMI 31.19 kg/m   68:18  PM 33 year old male with history of polysubstance use, bipolar, thrombocytopenia, presenting to the ER for evaluation of leg injury.  Patient reports 5 days ago he was walking in the rain and slipped and fell twisted his left ankle in the process.  He felt a pop.  He did land on his buttock but denies hitting his head or loss of consciousness.  He has been able to bear weight on his ankle but states it is very painful.  He has noticed bruising and swelling to the affected area.  He tries  using ice and elevation without adequate relief.  He mentioned has history of neuropathy from alcohol use but have been alcohol free for the past 5 months.  Denies any knee or hip pain.  Exam notable for left ankle tenderness most significant to the lateral malleoli region.  Also obvious ecchymosis noted to the dorsum of left foot but no significant tenderness to fifth metatarsal region.  Patient's neurovascular intact.  Initial x-ray of the left ankle obtained pain view inter by me which is negative for acute fracture or dislocation.  Agree with radiology interpretation.  Will also obtain x-ray of the left foot as well.  Offered pain medication but patient declined.  Additional imaging including left knee and left foot x-ray obtained independently viewed and interpreted by me and overall reassuring no acute fracture were noted.  DDx: Strain, sprain, fracture, dislocation, contusion  Symptoms consistent with a high ankle sprain, will provide ASO,  and RICE therapy.  Orthopedic referral given as needed.  Return precaution discussed.        Final Clinical Impression(s) / ED Diagnoses Final diagnoses:  Moderate left ankle sprain, initial encounter    Rx / DC Orders ED Discharge Orders          Ordered    ibuprofen (ADVIL) 600 MG tablet  Every 6 hours PRN        02/13/24 1719    cyclobenzaprine (FLEXERIL) 10 MG tablet  2 times daily PRN        02/13/24 1719              Fayrene Helper, PA-C 02/13/24  1721    Durwin Glaze, MD 02/13/24 [phone]

## 2024-04-13 ENCOUNTER — Telehealth: Payer: Self-pay | Admitting: Neurology

## 2024-04-13 NOTE — Telephone Encounter (Signed)
 Return call to patient, he reports 2 months ago he began having seizures a lot. His last seizure was this morning. They describe as staring off and then stiffening of the body and arms begin to jerk. He has also bit the inside of his mouth. He has missed 2-3 doses of medication, most recently 3 days ago. He denies injuries or head strikes. 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 I advised to inform us  each time seizures happen and I will send to Dr. Samara Crest for review.

## 2024-04-13 NOTE — Telephone Encounter (Signed)
 Pt's SGO called and LVM stating that the pt has been having seizure a lot more frequent recently, Pt had one today then like about 3 weeks ago and a time before that. I called back and informed them that there is no sooner appt than what pt has scheduled but did put the appt on the wait list. Please advise.

## 2024-04-15 NOTE — Telephone Encounter (Signed)
 Continue current meds

## 2024-04-15 NOTE — Telephone Encounter (Signed)
 Call to patient, advised to continue current medications. Reviewed seizure triggers and medication compliance

## 2024-04-21 DIAGNOSIS — R569 Unspecified convulsions: Secondary | ICD-10-CM | POA: Diagnosis not present

## 2024-04-24 DIAGNOSIS — G40909 Epilepsy, unspecified, not intractable, without status epilepticus: Secondary | ICD-10-CM

## 2024-05-05 ENCOUNTER — Encounter (INDEPENDENT_AMBULATORY_CARE_PROVIDER_SITE_OTHER): Payer: MEDICAID | Admitting: Neurology

## 2024-05-05 ENCOUNTER — Ambulatory Visit: Payer: Self-pay | Admitting: Neurology

## 2024-05-05 ENCOUNTER — Encounter: Payer: Self-pay | Admitting: Neurology

## 2024-05-05 ENCOUNTER — Other Ambulatory Visit (INDEPENDENT_AMBULATORY_CARE_PROVIDER_SITE_OTHER): Payer: MEDICAID | Admitting: Neurology

## 2024-05-05 DIAGNOSIS — R404 Transient alteration of awareness: Secondary | ICD-10-CM

## 2024-05-05 DIAGNOSIS — Z87898 Personal history of other specified conditions: Secondary | ICD-10-CM

## 2024-05-05 NOTE — Procedures (Signed)
    CLINICAL HISTORY: This is a 33 y/o M who presents with seizures during signs of stress and duress at least once a week. Some events, he will become stiff, others he will shake.  MEDICATIONS: Buspar , Neurontin , Atarax , Nicoderm, Zofran , Trileptal , Protonix , Desyrel , Trintellix, Zonegran   TECHNICAL SUMMARY: Electrodes are applied using the standard 10-20 international placement protocol. This ambulatory EEG recording was performed utilizing 23 electrode inputs including: (19) cephalic, (1) ground, (1) system reference, and (2) EKG. The EEG recording can be visualized in all standard types of montages for review. The recordings are done at a sampling rate of 200 samples per second, per channel, allowing for relatively high frequency responses of 70 Hz. EEG playbacks are done with digital high frequency filters noted at the top of the page. The EEG is notated with patient events at the direction of the patient by pressing a push button mounted on a waist worn EEG recording device. This EEG was also recorded using high definition digital video.  TECHNOLOGIST NOTE: No skull defect or scalp edemas were present.  BACKGROUND: The record was obtained in the awake and drowsy state. No sleep was able to be captured. The posterior awake dominant background activity was a continuous, reactive rhythm of 9 Hz. This was symmetric and well-modulated and attenuated with eye opening. Symmetric diffuse frontocentral beta range activity was present.  SIGNIFICANT FINDINGS: There were no apparent asymmetries, focal or generalized abnormalities noted. There were no apparent electrographic or clinical seizures noted by the reviewing neurodiagnostic technologist.  PATIENT EVENTS: There were no patient events noted or captured during this recording.  HYPERVENTILATION: Hyperventilation was not performed.  PHOTIC STIMULATION: IPS Was not performed.  EKG: There were no arrhythmias or abnormalities noted during  this recording.  Impression: This is a normal routine EEG study. No epileptiform activity is recorded in the tracing. Please note a normal EEG does not exclude a diagnosis of epilepsy.    Tasheema Perrone, MD [facility]

## 2024-05-05 NOTE — Procedures (Signed)
 Clinical History: This is a 33 y/o M who presents with seizures during signs of stress and duress at least once a week. Some events, he will become stiff, others he will shake.  INTERMITTENT MONITORING with VIDEO TECHNICAL SUMMARY: This AVEEG was performed using equipment provided by [employer] utilizing Bluetooth ( [employer] ) amplifiers with continuous EEGT attended video collection using encrypted remote transmission via [employer] secured cellular tower network with data rates for each AVEEG performed. This is a Therapist, music AVEEG, obtained, according to the 10-20 international electrode placement system, reformatted digitally into referential and bipolar montages. Data was acquired with a minimum of 21 bipolar connections and sampled at a minimum rate of 250 cycles per second per channel, maximum rate of 450 cycles per second per channel and two channels for EKG. The entire VEEG study was recorded through cable and or radio telemetry for subsequent analysis. Specified epochs of the AVEEG data were identified at the direction of the subject by the depression of a push button by the patient. Each patients event file included data acquired two minutes prior to the push button activation and continuing until two minutes afterwards. AVEEG files were reviewed on [employer] server, Licensed Software provided by [employer] with a digital high frequency filter set at 70 Hz and a low frequency filter set at 1 Hz with a paper speed of 19mm/s resulting in 10 seconds per digital page. This entire AVEEG was reviewed by the EEG Technologist. Random time samples, random sleep samples, clips, patient initiated push button files with included patient daily diary logs, EEG Technologist pruned data was reviewed and verified for accuracy and validity by the governing reading neurologist in full details. This AEEGV was fully compliant with all requirements for CPT 97500 for setup, patient education,  take down and administered by an EEG technologist.  Long-Term EEG with Video was monitored intermittently by a qualified EEG technologist for the entirety of the recording; quality check-ins were performed at a minimum of every two hours, checking and documenting real-time data and video to assure the integrity and quality of the recording (e.g., camera position, electrode integrity and impedance), and identify the need for maintenance. For intermittent monitoring, an EEG Technologist monitored no more than 12 patients concurrently. Diagnostic video was captured at least 80% of the time during the recording.  PATIENT EVENTS: There were no patient events noted or captured during this recording.  TECHNOLOGIST EVENTS: No clear epileptiform activity was detected by the reviewing neurodiagnostic technologist for further review.  TIME SAMPLES: 10-minutes of every 2 hours recorded are reviewed as random time samples.  SLEEP SAMPLES: 5-minutes of every 24 hours recorded are reviewed as random sleep samples.  AWAKE: At maximal level of alertness, the posterior dominant background activity was continuous, reactive, low voltage rhythm of 9 Hz. This was symmetric, well-modulated, and attenuated with eye opening. Diffuse, symmetric, frontocentral beta range activity was present  SLEEP: N1 Sleep (Stage 1) was observed and characterized by the disappearance of alpha rhythm and the appearance of vertex activity. N2 Sleep (Stage 2) was observed and characterized by vertex waves, K-complexes, and sleep spindles. N3 (Stage 3) sleep was observed and characterized by high amplitude Delta activity of 20%. REM sleep was observed.  EKG: There were no arrhythmias or abnormalities noted during this recording.  Impression: Normal EEG: awake and asleep  Clinical Correlation: This is a normal 3-day ambulatory EEG tracing. No focal abnormalities or epileptiform discharges were seen. There were no electrographic  seizures noted. No events were captured during the recording. Please note a normal EEG does not exclude the diagnosis of epilepsy.   Deunte Bledsoe, MD [facility]

## 2024-05-21 ENCOUNTER — Encounter: Payer: Self-pay | Admitting: Neurology

## 2024-05-21 ENCOUNTER — Other Ambulatory Visit: Payer: Self-pay | Admitting: Neurology

## 2024-05-21 MED ORDER — GABAPENTIN 600 MG PO TABS: 600.0000 mg | ORAL_TABLET | Freq: Three times a day (TID) | ORAL | 6 refills | Status: DC

## 2024-05-21 MED ORDER — OXCARBAZEPINE 600 MG PO TABS: 900.0000 mg | ORAL_TABLET | Freq: Two times a day (BID) | ORAL | 6 refills | Status: AC

## 2024-05-25 ENCOUNTER — Encounter: Payer: Self-pay | Admitting: Neurology

## 2024-06-04 ENCOUNTER — Encounter: Payer: Self-pay | Admitting: Neurology

## 2024-06-05 NOTE — Telephone Encounter (Signed)
 Continue current medications, if seizures not improved, we will refer him to the EMU for capture and characterization.

## 2024-06-26 ENCOUNTER — Encounter: Payer: Self-pay | Admitting: Neurology

## 2024-06-29 ENCOUNTER — Other Ambulatory Visit: Payer: Self-pay | Admitting: Neurology

## 2024-06-29 MED ORDER — CLOBAZAM 10 MG PO TABS: 10.0000 mg | ORAL_TABLET | Freq: Every evening | ORAL | 5 refills | Status: DC

## 2024-06-29 NOTE — Telephone Encounter (Signed)
 Please have patient start Clobazam  5 mg nightly for one week then increase to 10 mg nightly. Thanks

## 2024-07-08 ENCOUNTER — Telehealth: Payer: Self-pay | Admitting: Neurology

## 2024-07-08 NOTE — Telephone Encounter (Signed)
 Pt would like to know if his visit 07/09/2024 can be switched to virtual.

## 2024-07-09 ENCOUNTER — Telehealth (INDEPENDENT_AMBULATORY_CARE_PROVIDER_SITE_OTHER): Payer: MEDICAID | Admitting: Neurology

## 2024-07-09 ENCOUNTER — Encounter: Payer: Self-pay | Admitting: Neurology

## 2024-07-09 DIAGNOSIS — R569 Unspecified convulsions: Secondary | ICD-10-CM | POA: Diagnosis not present

## 2024-07-09 MED ORDER — CLOBAZAM 10 MG PO TABS: 10.0000 mg | ORAL_TABLET | Freq: Every evening | ORAL | 5 refills | Status: AC

## 2024-07-09 NOTE — Patient Instructions (Addendum)
 Continue with oxcarbazepine  900 mg twice daily Continue zonisamide  300 mg nightly Continue with clobazam  10 mg nightly If you continue to have seizures, we will refer you to EMU for capture and characterization Continue your other medications Follow-up in 6 to 8 months and please contact us  if you do have any additional seizures.

## 2024-07-09 NOTE — Progress Notes (Signed)
 [facility]  PATIENT: John Vaughan DOB: January 30, 1991  REFERRING DOCTOR OR PCP: Zane Bach, NP SOURCE: Patient, note from multiple emergency room visits, imaging and lab reports, MRI and CT personally reviewed.  _________________________________   HISTORICAL  CHIEF COMPLAINT:  Chief Complaint  Patient presents with   Follow-up    Seizure    INTERVAL HISTORY 07/09/2024 Patient presents today for follow-up, he is alone. Last visit was in February 2025.  At that time, plan was to continue him on Trileptal  at [facility] .  We also obtained an ambulatory EEG which was normal. He tells me he continues to have seizures that he described as zoning out and are related to stress.  Lately it has been good because he is able to spend time with his son.  He was not able to do so due to his alcoholism.  He has quit drinking and the last drink was in September 2024.  He called about 2 weeks ago complaining of multiple seizures.  At that time we have added clobazam  but he has not started the medication.  I did explain to him that I suspect some of these events are non epileptic and if he continued to have seizure we will have to admit him to the EMU.   INTERVAL HISTORY 02/03/2024:  Patient presents today for follow-up, last visit was in August, at that time we have planned to obtain an ambulatory EEG but it has not been done yet.  They tell me that he has been sober for the past 5 months, has not had any alcohol.  Girlfriend told me that he was doing better, stayed months without seizure but now he has been under lots of stress and currently he is having on average seizure once a week, his typical staring spells.  Sometimes he will have episodes where he would become stiff and has some convulsion.  They denies any tongue biting, denies any urinary incontinence and no injuries.    INTERVAL HISTORY 07/29/2023:  Patient presented for follow-up, he is accompanied by girlfriend.  Last visit was last month with Dr. Vear. Since then he had a EEG which which showed right frontal slowing. Trileptal  was added.  then he was recently admitted to the hospital for alcohol intoxication and suicidal ideation, discharged on July 28.  She reported being sober for the past past 9 days.  He has plan of discontinuing alcohol.  He does have a psychiatrist but has not joined any AA meeting.  Reports his last seizure was last Saturday, 3 days ago.  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  He is compliant with his Keppra  750 mg twice daily and Trileptal  300/600 mg.  He is also complaining of cognitive impairment . He is pending ambulatory EEG.    HISTORY OF PRESENT ILLNESS Dr. VEAR:  I had the pleasure seeing patient, John Vaughan, at [facility] for a neurologic consultation regarding his seizures.  He is a 33 year old man who has had seizures since age 33.  His first seizure was a convulsion and he started kicking while watching TV and was unresponsive x many minutes.   A second spell a little bu later was also a convulsion and he hit his nose breaking it.   More recently he has both staring spells and convulsions, often with the convulsion following the staring spell.  At baseline before a few months ago, he would have 2/month on average.  He has been to the emergency room multiple times in the last 6 months and carries a diagnosis of seizures, pseudoseizures and alcohol abuse.    He has had multiple spells with both staring spells and convulsions in June.  Some spells started with staring spells and then convulsions.    Last week, he had > 20 spells and went to the ED.    He does not have much recall of the day.   His most recent emergency room visit was on 06/18/2023 and he was loaded with Keppra  and then placed on an outpatient dose of 750 mg p.o. twice daily.    He has been on Keppra  of/on x many years.   No seizure in last 3 day.     He has  never had an EEG.  MRI 08/26/2022 shows mild atrophy.    He has a long history of alcohol abuse regularly drinking > 24 beers a day.   He started drinking in his mid teens.   He is currently not drinking.  Seizures occur both with moderate to heavy alcohol use and with withdrawal.     He has had some head trauma but never hospitalized for head injuries.    He never had meningitis or encephalitis.    He reached developmental milestones at the right age according to his mom.   He has a long history of alcohol abuse and also has had suicide ideation.  He was in a psychiatric hospital in mid June for detox.  He reports numbness in his feet since 2018 or so.     Imaging: I personally reviewed the MRI of the brain from 08/26/2022.  It shows mild generalized cortical atrophy, more than would be expected at his age.  There was also mild cerebellar atrophy.  There were no acute findings.  CT scan of the head 06/24/2023 was unchanged   REVIEW OF SYSTEMS: Constitutional: No fevers, chills, sweats, or change in appetite Eyes: No visual changes, double vision, eye pain Ear, nose and throat: No hearing loss, ear pain, nasal congestion, sore throat Cardiovascular: No chest pain, palpitations Respiratory:  No shortness of breath at rest or with exertion.   No wheezes GastrointestinaI: No nausea, vomiting, diarrhea, abdominal pain, fecal incontinence Genitourinary:  No dysuria, urinary retention or frequency.  No nocturia. Musculoskeletal:  No neck pain, back pain Integumentary: No rash, pruritus, skin lesions Neurological: as above Psychiatric: No depression at this time.  No anxiety Endocrine: No palpitations, diaphoresis, change in appetite, change in weigh or increased thirst Hematologic/Lymphatic:  No anemia, purpura, petechiae. Allergic/Immunologic: No itchy/runny eyes, nasal congestion, recent allergic reactions, rashes  ALLERGIES: Allergies  Allergen Reactions   Food Diarrhea, Nausea And  Vomiting and Rash    Nuts   Chicken Allergy Hives and Diarrhea   Fruit Extracts Diarrhea    HOME MEDICATIONS:  Current Outpatient Medications:    ARIPiprazole  (ABILIFY ) 5 MG tablet, Take 1 tablet (5 mg total) by mouth daily., Disp: 30 tablet, Rfl: 0   busPIRone  (BUSPAR ) 10 MG tablet, Take 10 mg by mouth daily., Disp: , Rfl:    cloBAZam  (ONFI ) 10 MG tablet, Take 1 tablet (10 mg total) by mouth at bedtime., Disp: 30 tablet, Rfl: 5   cyclobenzaprine  (FLEXERIL ) 10 MG tablet, Take 1 tablet (10 mg total) by mouth 2 (two) times daily as needed for muscle spasms., Disp: 20 tablet, Rfl: 0   gabapentin  (NEURONTIN ) 600 MG tablet, Take 1 tablet (600 mg total) by mouth 3 (three) times  daily., Disp: 90 tablet, Rfl: 6   hydrOXYzine  (ATARAX ) 50 MG tablet, Take 50 mg by mouth as needed., Disp: , Rfl:    ibuprofen  (ADVIL ) 600 MG tablet, Take 1 tablet (600 mg total) by mouth every 6 (six) hours as needed., Disp: 30 tablet, Rfl: 0   nicotine  (NICODERM CQ  - DOSED IN MG/24 HOURS) 21 mg/24hr patch, Place 21 mg onto the skin daily., Disp: , Rfl:    ondansetron  (ZOFRAN -ODT) 8 MG disintegrating tablet, Take 8 mg by mouth every 8 (eight) hours as needed., Disp: , Rfl:    oxcarbazepine  (TRILEPTAL ) 600 MG tablet, Take 1.5 tablets (900 mg total) by mouth 2 (two) times daily., Disp: 90 tablet, Rfl: 6   pantoprazole  (PROTONIX ) 40 MG tablet, Take 1 tablet (40 mg total) by mouth 2 (two) times daily before a meal. Take 30 minutes before breakfast and dinner, Disp: 60 tablet, Rfl: 1   traZODone  (DESYREL ) 150 MG tablet, Take 1 tablet (150 mg total) by mouth at bedtime as needed for sleep., Disp: 30 tablet, Rfl: 0   vortioxetine HBr (TRINTELLIX) 10 MG TABS tablet, Take 10 mg by mouth daily., Disp: , Rfl:    zonisamide  (ZONEGRAN ) 100 MG capsule, Take 3 capsules (300 mg total) by mouth at bedtime., Disp: 540 capsule, Rfl: 0  PAST MEDICAL HISTORY: Past Medical History:  Diagnosis Date   ADHD    Anxiety    Autism    Bipolar 2  disorder (HCC)    Depression    GERD (gastroesophageal reflux disease)    HLD (hyperlipidemia)    HTN (hypertension)    Neuropathy    Schizophrenia (HCC)    Seizures (HCC)    Sleep apnea    does not use cpap   Substance abuse (HCC) 06/28/2023   alcohol    PAST SURGICAL HISTORY: Past Surgical History:  Procedure Laterality Date   COLONOSCOPY  11/28/2023   HAND SURGERY Right    TYMPANOSTOMY TUBE PLACEMENT     UPPER GASTROINTESTINAL ENDOSCOPY  11/28/2023   WISDOM TOOTH EXTRACTION      FAMILY HISTORY: Family History  Problem Relation Age of Onset   Anxiety disorder Mother    Interstitial cystitis Mother    COPD Mother    Heart disease Mother    Alcohol abuse Father    COPD Father    Seizures Father    Heart disease Father    Anxiety disorder Maternal Aunt    Anxiety disorder Maternal Grandmother    Diabetes Maternal Grandmother    Heart disease Maternal Grandfather    Diabetes Paternal Grandmother    Alcohol abuse Paternal Grandfather    Heart disease Paternal Grandfather    Autism Son    ADD / ADHD Son    Colon cancer Neg Hx    Esophageal cancer Neg Hx    Rectal cancer Neg Hx    Stomach cancer Neg Hx     SOCIAL HISTORY: Social History   Socioeconomic History   Marital status: Significant Other    Spouse name: elliott   Number of children: 1   Years of education: 12   Highest education level: 12th grade  Occupational History   Not on file  Tobacco Use   Smoking status: Every Day    Current packs/day: 1.00    Average packs/day: 0.8 packs/day for 31.0 years (23.5 ttl pk-yrs)    Types: Cigarettes    Start date: 07/20/2008   Smokeless tobacco: Former    Types: Snuff    Quit  date: 07/21/2023  Vaping Use   Vaping status: Never Used  Substance and Sexual Activity   Alcohol use: Not Currently    Alcohol/week: 28.0 standard drinks of alcohol    Types: 28 Cans of beer per week    Comment: quit Sept 10 2024   Drug use: Not Currently    Types: Cocaine     Comment: last used cocaine 4 months 06/2023 ago, usually one gram used weekly   Sexual activity: Yes  Other Topics Concern   Not on file  Social History Narrative   Right Handed    No Caffeine Use    Social Drivers of Health   Financial Resource Strain: High Risk (05/19/2024)   Received from [facility]   Overall Financial Resource Strain (CARDIA)    Difficulty of Paying Living Expenses: Hard  Food Insecurity: Food Insecurity Present (05/19/2024)   Received from [facility]   Hunger Vital Sign    Within the past 12 months, you worried that your food would run out before you got the money to buy more.: Often true    Within the past 12 months, the food you bought just didn't last and you didn't have money to get more.: Sometimes true  Transportation Needs: Unmet Transportation Needs (05/19/2024)   Received from [facility] - Transportation    Lack of Transportation (Medical): Yes    Lack of Transportation (Non-Medical): Yes  Physical Activity: Insufficiently Active (05/19/2024)   Received from [facility]   Exercise Vital Sign    On average, how many days per week do you engage in moderate to strenuous exercise (like a brisk walk)?: 5 days    On average, how many minutes do you engage in exercise at this level?: 10 min  Stress: Stress Concern Present (05/19/2024)   Received from [facility] - Occupational Stress Questionnaire    Feeling of Stress : Very much  Social Connections: Moderately Integrated (05/19/2024)   Received from [facility]   Social Network    How would you rate your social network (family, work, friends)?: Adequate participation with social networks  Intimate Partner Violence: Not At Risk (05/19/2024)   Received from [facility]   HITS    Over the last 12 months how often did your partner physically hurt you?: Never    Over the last 12 months how often did your partner insult you or talk down to you?:  Never    Over the last 12 months how often did your partner threaten you with physical harm?: Never    Over the last 12 months how often did your partner scream or curse at you?: Rarely       PHYSICAL EXAM  There were no vitals filed for this visit.   There is no height or weight on file to calculate BMI.   General: The patient is well-developed and well-nourished and in no acute distress  HEENT:  Head is [place]/AT.  Sclera are anicteric.   Neck: No carotid bruits are noted.  The neck is nontender.  Skin: Extremities are without rash or  edema.  Musculoskeletal:  Back is nontender  Neurologic Exam  Mental status: The patient is alert and oriented x 3 at the time of the examination. The patient has apparent normal recent and remote memory, with an apparently normal attention span and concentration ability.   Speech is normal.     DIAGNOSTIC DATA (LABS, IMAGING, TESTING) - I  reviewed patient records, labs, notes, testing and imaging myself where available.  Lab Results  Component Value Date   WBC 5.6 10/14/2023   HGB 13.8 10/14/2023   HCT 41.2 10/14/2023   MCV 98.2 10/14/2023   PLT 297.0 10/14/2023      Component Value Date/Time   NA 139 02/03/2024 1205   K 4.5 02/03/2024 1205   CL 105 02/03/2024 1205   CO2 19 (L) 02/03/2024 1205   GLUCOSE 104 (H) 02/03/2024 1205   GLUCOSE 99 10/14/2023 1515   BUN 13 02/03/2024 1205   CREATININE 1.17 02/03/2024 1205   CALCIUM 9.6 02/03/2024 1205   PROT 6.1 02/03/2024 1205   ALBUMIN 4.3 02/03/2024 1205   AST 19 02/03/2024 1205   ALT 27 02/03/2024 1205   ALKPHOS 65 02/03/2024 1205   BILITOT <0.2 02/03/2024 1205   GFRNONAA >60 08/25/2023 0045   GFRAA >60 09/05/2020 1200   Lab Results  Component Value Date   CHOL 199 03/28/2023   HDL 64 03/28/2023   LDLCALC 104 (H) 03/28/2023   TRIG 157 (H) 03/28/2023   CHOLHDL 3.1 03/28/2023   Lab Results  Component Value Date   HGBA1C 5.6 03/28/2023   Lab Results  Component Value  Date   VITAMINB12 648 06/26/2023   Lab Results  Component Value Date   TSH 1.641 03/28/2023   Amb EEG 05/05/2024 This is a normal 3-day ambulatory EEG tracing. No focal abnormalities or epileptiform discharges were seen. There were no electrographic seizures noted. No events were captured during the recording. Please note a normal EEG does not exclude the diagnosis of epilepsy.    MRI Brain 07/30/2023 Mild generalized cortical atrophy and cerebellar atrophy, unexpected for this age but stable compared to the 09/12/2022 MRI. Brain parenchyma has normal signal.  Structures of the medial temporal lobes appear normal. No acute findings.  Normal enhancement pattern.    ASSESSMENT AND PLAN  Seizure-like activity (HCC)  Nonepileptic episode [facility])  He is a 33 year old man with a history of seizures, non epileptic events and polysubstance abuse in remission, last drink September 2024 here for follow up.  MRI does show some atrophy though unclear if this was congenital or due to the extensive substance/alcohol abuse.  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  Most recent 3 day ambulatory EEG was normal. He is on Zonisamide  300 mg nightly, Trileptal  900 mg twice daily and we added Clobazam  10 mg nightly but he has not started it. If he continues to have seizures, will admit him to the EMU for capture and characterization. I will see him in 6 months for follow up.  For his Neuropathy, advise him to continue with Gabapentin  300 mg three time daily and to also consider adding Vitamin B complex, alpha lipoic acid.    Patient Instructions  Continue with oxcarbazepine  900 mg twice daily Continue zonisamide  300 mg nightly Continue with clobazam  10 mg nightly If you continue to have seizures, we will refer you to EMU for capture and characterization Continue your other medications Follow-up in 6 to 8 months and please contact us  if you do have any additional  seizures.    Virtual Visit via Video Note  I connected with  Beverley Adine Gravely on 07/09/24 at  3:45 PM EDT by a video enabled telemedicine application and verified that I am speaking with the correct person using two identifiers.  Location: Patient: home Provider: [facility] Office   I discussed the limitations of evaluation  and management by telemedicine and the availability of in person appointments. The patient expressed understanding and agreed to proceed.   I discussed the assessment and treatment plan with the patient. The patient was provided an opportunity to ask questions and all were answered. The patient agreed with the plan and demonstrated an understanding of the instructions.   The patient was advised to call back or seek an in-person evaluation if the symptoms worsen or if the condition fails to improve as anticipated.  I provided 15 minutes of non-face-to-face time during this encounter.   I have spent a total of 30 minutes dedicated to this patient today, preparing to see patient, performing a medically appropriate examination and evaluation, ordering tests and/or medications and procedures, and counseling and educating the patient/family/caregiver; independently interpreting result and communicating results to the family/patient/caregiver; and documenting clinical information in the electronic medical record.   Pastor Falling, MD 07/09/2024, 4:22 PM  [facility] [address], [address] [place], [place] [address] [phone]

## 2024-07-13 ENCOUNTER — Encounter: Payer: Self-pay | Admitting: Neurology

## 2024-07-14 ENCOUNTER — Telehealth: Payer: Self-pay | Admitting: Neurology

## 2024-07-14 ENCOUNTER — Telehealth: Payer: Self-pay

## 2024-07-14 ENCOUNTER — Other Ambulatory Visit: Payer: Self-pay | Admitting: Neurology

## 2024-07-14 DIAGNOSIS — R569 Unspecified convulsions: Secondary | ICD-10-CM

## 2024-07-14 NOTE — Telephone Encounter (Signed)
 Referral sent thru Epic to [facility]

## 2024-07-14 NOTE — Telephone Encounter (Signed)
 Verbal discussion with Dr. Gregg, no rescue medication needed and continue meds as prescribed. Patient having non epilectic seziurs. Will refer to EMU for monitoring  Call to patient and reviewed Dr. Gregg recommendations. Also advised that due to non epileptic events, they can go to ER if they feel like they need to but not indicated. Also advised to try and reduce stress and anxiety. Patient verbalized understanding

## 2024-07-14 NOTE — Telephone Encounter (Signed)
 Will refer patient to EMU for capture and characterization.

## 2024-07-14 NOTE — Telephone Encounter (Signed)
 Call to patient after receiving mychart message about multiple seizures on Sunday 7/20. Patient reports starting ONFI  on Friday and starting feeling off on Sunday and then had 5 GTC  seizures on Sunday.He states wife said each episode was shorter and less intense. He reports that wife said he convulsed, fell and she caught him, he did not hit his head. He does report wife said his eyes were rolling back in his head. He doesn't remember the episodes. He reports still being off today with brain fog and hands tremors. They did not go to ER or call 911. He reports prior to visit last week he had missed some medication doses. I advised I would discuss with Dr. Gregg and reach back out with recommendations.

## 2024-07-17 ENCOUNTER — Encounter: Payer: Self-pay | Admitting: Neurology

## 2024-08-05 ENCOUNTER — Ambulatory Visit: Payer: Medicaid Other | Admitting: Neurology

## 2024-08-17 ENCOUNTER — Inpatient Hospital Stay (HOSPITAL_COMMUNITY): Payer: MEDICAID

## 2024-08-17 ENCOUNTER — Inpatient Hospital Stay (HOSPITAL_COMMUNITY)
Admission: RE | Admit: 2024-08-17 | Discharge: 2024-08-21 | DRG: 101 | Disposition: A | Discharge status: Home / Self Care | Payer: MEDICAID | Attending: Neurology | Admitting: Neurology

## 2024-08-17 DIAGNOSIS — R1110 Vomiting, unspecified: Secondary | ICD-10-CM | POA: Diagnosis not present

## 2024-08-17 DIAGNOSIS — Z833 Family history of diabetes mellitus: Secondary | ICD-10-CM | POA: Diagnosis not present

## 2024-08-17 DIAGNOSIS — R001 Bradycardia, unspecified: Secondary | ICD-10-CM | POA: Diagnosis not present

## 2024-08-17 DIAGNOSIS — Z818 Family history of other mental and behavioral disorders: Secondary | ICD-10-CM

## 2024-08-17 DIAGNOSIS — Z8249 Family history of ischemic heart disease and other diseases of the circulatory system: Secondary | ICD-10-CM | POA: Diagnosis not present

## 2024-08-17 DIAGNOSIS — Z5982 Transportation insecurity: Secondary | ICD-10-CM

## 2024-08-17 DIAGNOSIS — K219 Gastro-esophageal reflux disease without esophagitis: Secondary | ICD-10-CM

## 2024-08-17 DIAGNOSIS — F32A Depression, unspecified: Secondary | ICD-10-CM

## 2024-08-17 DIAGNOSIS — F17200 Nicotine dependence, unspecified, uncomplicated: Secondary | ICD-10-CM | POA: Diagnosis not present

## 2024-08-17 DIAGNOSIS — Z5986 Financial insecurity: Secondary | ICD-10-CM

## 2024-08-17 DIAGNOSIS — G4700 Insomnia, unspecified: Secondary | ICD-10-CM

## 2024-08-17 DIAGNOSIS — G40909 Epilepsy, unspecified, not intractable, without status epilepticus: Secondary | ICD-10-CM | POA: Diagnosis present

## 2024-08-17 DIAGNOSIS — F840 Autistic disorder: Secondary | ICD-10-CM | POA: Diagnosis present

## 2024-08-17 DIAGNOSIS — E785 Hyperlipidemia, unspecified: Secondary | ICD-10-CM | POA: Diagnosis present

## 2024-08-17 DIAGNOSIS — I10 Essential (primary) hypertension: Secondary | ICD-10-CM | POA: Diagnosis present

## 2024-08-17 DIAGNOSIS — Z79899 Other long term (current) drug therapy: Secondary | ICD-10-CM

## 2024-08-17 DIAGNOSIS — F419 Anxiety disorder, unspecified: Secondary | ICD-10-CM | POA: Diagnosis present

## 2024-08-17 DIAGNOSIS — F17210 Nicotine dependence, cigarettes, uncomplicated: Secondary | ICD-10-CM | POA: Diagnosis present

## 2024-08-17 DIAGNOSIS — R569 Unspecified convulsions: Principal | ICD-10-CM

## 2024-08-17 LAB — CBC WITH DIFFERENTIAL/PLATELET
Abs Immature Granulocytes: 0.01 K/uL (ref 0.00–0.07)
Basophils Absolute: 0 K/uL (ref 0.0–0.1)
Basophils Relative: 1 %
Eosinophils Absolute: 0.1 K/uL (ref 0.0–0.5)
Eosinophils Relative: 4 %
HCT: 35.8 % — ABNORMAL LOW (ref 39.0–52.0)
Hemoglobin: 12.4 g/dL — ABNORMAL LOW (ref 13.0–17.0)
Immature Granulocytes: 0 %
Lymphocytes Relative: 33 %
Lymphs Abs: 1.1 K/uL (ref 0.7–4.0)
MCH: 34.3 pg — ABNORMAL HIGH (ref 26.0–34.0)
MCHC: 34.6 g/dL (ref 30.0–36.0)
MCV: 99.2 fL (ref 80.0–100.0)
Monocytes Absolute: 0.4 K/uL (ref 0.1–1.0)
Monocytes Relative: 14 %
Neutro Abs: 1.5 K/uL — ABNORMAL LOW (ref 1.7–7.7)
Neutrophils Relative %: 48 %
Platelets: 234 K/uL (ref 150–400)
RBC: 3.61 MIL/uL — ABNORMAL LOW (ref 4.22–5.81)
RDW: 13.7 % (ref 11.5–15.5)
WBC: 3.2 K/uL — ABNORMAL LOW (ref 4.0–10.5)
nRBC: 0 % (ref 0.0–0.2)

## 2024-08-17 LAB — RAPID URINE DRUG SCREEN, HOSP PERFORMED
Amphetamines: NOT DETECTED
Barbiturates: NOT DETECTED
Benzodiazepines: POSITIVE — AB
Cocaine: NOT DETECTED
Opiates: NOT DETECTED
Tetrahydrocannabinol: NOT DETECTED

## 2024-08-17 LAB — COMPREHENSIVE METABOLIC PANEL WITH GFR
ALT: 85 U/L — ABNORMAL HIGH (ref 0–44)
AST: 57 U/L — ABNORMAL HIGH (ref 15–41)
Albumin: 3.2 g/dL — ABNORMAL LOW (ref 3.5–5.0)
Alkaline Phosphatase: 69 U/L (ref 38–126)
Anion gap: 11 (ref 5–15)
BUN: 14 mg/dL (ref 6–20)
CO2: 21 mmol/L — ABNORMAL LOW (ref 22–32)
Calcium: 8.8 mg/dL — ABNORMAL LOW (ref 8.9–10.3)
Chloride: 104 mmol/L (ref 98–111)
Creatinine, Ser: 0.93 mg/dL (ref 0.61–1.24)
GFR, Estimated: >60 mL/min (ref >60)
Glucose, Bld: 108 mg/dL — ABNORMAL HIGH (ref 70–99)
Potassium: 3.9 mmol/L (ref 3.5–5.1)
Sodium: 136 mmol/L (ref 135–145)
Total Bilirubin: 1 mg/dL (ref 0.0–1.2)
Total Protein: 6 g/dL — ABNORMAL LOW (ref 6.5–8.1)

## 2024-08-17 LAB — PROTIME-INR
INR: 1.1 (ref 0.8–1.2)
Prothrombin Time: 15.1 s (ref 11.4–15.2)

## 2024-08-17 LAB — HIV ANTIBODY (ROUTINE TESTING W REFLEX): HIV Screen 4th Generation wRfx: NONREACTIVE

## 2024-08-17 LAB — MAGNESIUM: Magnesium: 2 mg/dL (ref 1.7–2.4)

## 2024-08-17 LAB — PHOSPHORUS: Phosphorus: 3.7 mg/dL (ref 2.5–4.6)

## 2024-08-17 MED ORDER — ENOXAPARIN SODIUM 60 MG/0.6ML IJ SOSY
50.0000 mg | PREFILLED_SYRINGE | Freq: Every day | INTRAMUSCULAR | Status: DC
  Administered 2024-08-17 – 2024-08-20 (×4): 50 mg via SUBCUTANEOUS
  Filled 2024-08-17 (×5): qty 0.6

## 2024-08-17 MED ORDER — GABAPENTIN 300 MG PO CAPS
300.0000 mg | ORAL_CAPSULE | Freq: Two times a day (BID) | ORAL | Status: DC
  Administered 2024-08-17 – 2024-08-18 (×3): 300 mg via ORAL
  Filled 2024-08-17 (×3): qty 1

## 2024-08-17 MED ORDER — VORTIOXETINE HBR 5 MG PO TABS
10.0000 mg | ORAL_TABLET | Freq: Every day | ORAL | Status: DC
  Administered 2024-08-17 – 2024-08-21 (×5): 10 mg via ORAL
  Filled 2024-08-17 (×5): qty 2

## 2024-08-17 MED ORDER — PANTOPRAZOLE SODIUM 40 MG PO TBEC
40.0000 mg | DELAYED_RELEASE_TABLET | Freq: Two times a day (BID) | ORAL | Status: DC
  Administered 2024-08-17 – 2024-08-21 (×9): 40 mg via ORAL
  Filled 2024-08-17 (×9): qty 1

## 2024-08-17 MED ORDER — BUSPIRONE HCL 10 MG PO TABS
10.0000 mg | ORAL_TABLET | Freq: Every day | ORAL | Status: DC
  Administered 2024-08-17 – 2024-08-21 (×5): 10 mg via ORAL
  Filled 2024-08-17 (×5): qty 1

## 2024-08-17 MED ORDER — OXCARBAZEPINE 300 MG PO TABS
300.0000 mg | ORAL_TABLET | Freq: Once | ORAL | Status: AC
  Administered 2024-08-17: 300 mg via ORAL
  Filled 2024-08-17: qty 1

## 2024-08-17 MED ORDER — HYDROXYZINE HCL 25 MG PO TABS
50.0000 mg | ORAL_TABLET | Freq: Three times a day (TID) | ORAL | Status: DC | PRN
  Administered 2024-08-19: 50 mg via ORAL
  Filled 2024-08-17: qty 2

## 2024-08-17 MED ORDER — LABETALOL HCL 5 MG/ML IV SOLN: 5.0000 mg | INTRAVENOUS | Status: DC | PRN

## 2024-08-17 MED ORDER — ACETAMINOPHEN 650 MG RE SUPP: 650.0000 mg | RECTAL | Status: DC | PRN

## 2024-08-17 MED ORDER — SODIUM CHLORIDE 0.9% FLUSH
3.0000 mL | Freq: Two times a day (BID) | INTRAVENOUS | Status: DC
  Administered 2024-08-17 – 2024-08-20 (×8): 3 mL via INTRAVENOUS

## 2024-08-17 MED ORDER — ACETAMINOPHEN 325 MG PO TABS: 650.0000 mg | ORAL_TABLET | ORAL | Status: DC | PRN

## 2024-08-17 MED ORDER — MIDAZOLAM HCL 2 MG/2ML IJ SOLN
2.0000 mg | INTRAMUSCULAR | Status: DC | PRN
  Filled 2024-08-17: qty 2

## 2024-08-17 MED ORDER — TRAZODONE HCL 50 MG PO TABS
150.0000 mg | ORAL_TABLET | Freq: Every day | ORAL | Status: AC
  Administered 2024-08-17: 150 mg via ORAL
  Filled 2024-08-17: qty 1

## 2024-08-17 MED ORDER — ARIPIPRAZOLE 5 MG PO TABS
5.0000 mg | ORAL_TABLET | Freq: Every day | ORAL | Status: DC
  Administered 2024-08-17 – 2024-08-21 (×5): 5 mg via ORAL
  Filled 2024-08-17 (×5): qty 1

## 2024-08-17 MED ORDER — NICOTINE POLACRILEX 2 MG MT GUM: 4.0000 mg | CHEWING_GUM | OROMUCOSAL | Status: DC | PRN

## 2024-08-17 MED ORDER — NICOTINE 21 MG/24HR TD PT24
21.0000 mg | MEDICATED_PATCH | Freq: Every day | TRANSDERMAL | Status: DC
  Administered 2024-08-17 – 2024-08-20 (×4): 21 mg via TRANSDERMAL
  Filled 2024-08-17 (×5): qty 1

## 2024-08-17 NOTE — H&P (Signed)
 CC: seizure  History is obtained from:   HPI: John Vaughan is a 33 y.o. male  with h/o ADHD, Depression, alcohol use disorder not drinking since 08/2023, neuropathy, GERD who is admitted to epilepsy monitoring unit for characterization of spells  Patient and fiance state he started having staring spells at age 61 However few years ago he also had GTC seizure in setting of alcohol withdrawal and now has both staring spells and GTC seizure like episodes. Staring spells happen almost daily, last 30 second to 1 minute during which patient can fall if standing, doesn't respond and gasps once spells is over. He then has trouble remembering what happened and sleeps for the rest of the day. GTC happen once a month avg, last episode few days ago.. During GTC he has generalized shaking, tongue bite and urinary incontinence lasting for 1-5 minutes.   Epilepsy risk factors: Normal birth and development, denies febrile seizure, no meningitis/encephalitis, no head injury with LOC. Father had GTC seizures  Current ASMs: Zonisamide , OXC, GBP and Onfi  Prior ASM: LEV stopped due to irritability and not working  MRI brain w and wo contrast 07/30/2023: Mild generalized cortical atrophy and cerebellar atrophy, unexpected for this age but stable compared to the 09/12/2022 MRI. Brain parenchyma has normal signal. Structures of the medial temporal lobes appear normal. No acute findings. Normal enhancement pattern.   ROS: All other systems reviewed and negative except as noted in the HPI.   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    Family History  Problem Relation Age of Onset   Anxiety disorder Mother     Interstitial cystitis Mother    COPD Mother    Heart disease Mother    Alcohol abuse Father    COPD Father    Seizures Father    Heart disease Father    Anxiety disorder Maternal Aunt    Anxiety disorder Maternal Grandmother    Diabetes Maternal Grandmother    Heart disease Maternal Grandfather    Diabetes Paternal Grandmother    Alcohol abuse Paternal Grandfather    Heart disease Paternal Grandfather    Autism Son    ADD / ADHD Son    Colon cancer Neg Hx    Esophageal cancer Neg Hx    Rectal cancer Neg Hx    Stomach cancer Neg Hx    Social History:  reports that he has been smoking cigarettes. He started smoking about 16 years ago. He has a 23.6 pack-year smoking history. He quit smokeless tobacco use about 12 months ago.  His smokeless tobacco use included snuff. He reports that he does not currently use alcohol after a past usage of about 28.0 standard drinks of alcohol per week. He reports that he does not currently use drugs after having used the following drugs: Cocaine.   Medications Prior to Admission  Medication Sig Dispense Refill Last Dose/Taking   ARIPiprazole  (ABILIFY ) 5 MG tablet Take 1 tablet (5 mg total) by mouth daily. 30 tablet 0 08/16/2024   busPIRone  (BUSPAR ) 10 MG tablet Take 10 mg by mouth  daily.   08/16/2024   cloBAZam  (ONFI ) 10 MG tablet Take 1 tablet (10 mg total) by mouth at bedtime. 30 tablet 5 08/16/2024   gabapentin  (NEURONTIN ) 600 MG tablet Take 1 tablet (600 mg total) by mouth 3 (three) times daily. (Patient taking differently: Take 600 mg by mouth 2 (two) times daily.) 90 tablet 6 08/16/2024   hydrOXYzine  (ATARAX ) 50 MG tablet Take 50 mg by mouth as needed for anxiety, itching, nausea or vomiting.   Unknown   nicotine  (NICODERM CQ  - DOSED IN MG/24 HOURS) 21 mg/24hr patch Place 21 mg onto the skin daily.   Past Week   ondansetron  (ZOFRAN -ODT) 8 MG disintegrating tablet Take 8 mg by mouth every 8 (eight) hours as needed for nausea, vomiting or refractory  nausea / vomiting.   08/16/2024   oxcarbazepine  (TRILEPTAL ) 600 MG tablet Take 1.5 tablets (900 mg total) by mouth 2 (two) times daily. 90 tablet 6 08/16/2024   pantoprazole  (PROTONIX ) 40 MG tablet Take 1 tablet (40 mg total) by mouth 2 (two) times daily before a meal. Take 30 minutes before breakfast and dinner 60 tablet 1 08/16/2024   Testosterone Undecanoate 100 MG CAPS Take 300 mg by mouth daily at 12 noon.   08/16/2024   traZODone  (DESYREL ) 150 MG tablet Take 1 tablet (150 mg total) by mouth at bedtime as needed for sleep. 30 tablet 0 08/16/2024   vortioxetine  HBr (TRINTELLIX ) 10 MG TABS tablet Take 10 mg by mouth daily.   08/16/2024   zonisamide  (ZONEGRAN ) 100 MG capsule Take 3 capsules (300 mg total) by mouth at bedtime. 540 capsule 0 08/16/2024      Exam: Current vital signs: BP 123/80 (BP Location: Left Arm)   Pulse 78   Temp (!) 97.5 F (36.4 C) (Oral)   Resp 19   SpO2 99%  Vital signs in last 24 hours: Temp:  [97.5 F (36.4 C)] 97.5 F (36.4 C) (08/25 9061) Pulse Rate:  [78] 78 (08/25 0938) Resp:  [19] 19 (08/25 0938) BP: (123)/(80) 123/80 (08/25 0938) SpO2:  [99 %] 99 % (08/25 9061)   Physical Exam  Constitutional: Appears well-developed and well-nourished.  Psych: Affect appropriate to situation Neuro: AOX3, follows commands, CN grossly intact, 5/5 in all extremities, sensory intact, FTN intact  I have reviewed labs in epic and the results pertinent to this consultation are: CBC: No results for input(s): WBC, NEUTROABS, HGB, HCT, MCV, PLT in the last 168 hours.  Basic Metabolic Panel:  Lab Results  Component Value Date   NA 139 02/03/2024   K 4.5 02/03/2024   CO2 19 (L) 02/03/2024   GLUCOSE 104 (H) 02/03/2024   BUN 13 02/03/2024   CREATININE 1.17 02/03/2024   CALCIUM 9.6 02/03/2024   GFRNONAA >60 08/25/2023   GFRAA >60 09/05/2020   Lipid Panel:  Lab Results  Component Value Date   LDLCALC 104 (H) 03/28/2023   HgbA1c:  Lab Results  Component  Value Date   HGBA1C 5.6 03/28/2023   Urine Drug Screen:     Component Value Date/Time   LABOPIA NONE DETECTED 08/24/2023 2320   COCAINSCRNUR NONE DETECTED 08/24/2023 2320   LABBENZ NONE DETECTED 08/24/2023 2320   AMPHETMU NONE DETECTED 08/24/2023 2320   THCU NONE DETECTED 08/24/2023 2320   LABBARB NONE DETECTED 08/24/2023 2320    Alcohol Level     Component Value Date/Time   ETH 205 (H) 08/25/2023 0045     I have reviewed the images obtained  MRI brain w and wo  contrast 07/30/2023: Mild generalized cortical atrophy and cerebellar atrophy, unexpected for this age but stable compared to the 09/12/2022 MRI. Brain parenchyma has normal signal. Structures of the medial temporal lobes appear normal. No acute findings. Normal enhancement pattern.  ASSESSMENT/PLAN: 34yo M admitted to epilepsy monitoring unit for characterization of spells  Seizure like episodes - Start video eeg monitoring - One time dose of oxcarb and gabapentin  and no ASMs tonight - HV and Photic stimulation tomorrow - PRN IV versed  - Seizure precautions  Depression GERD Insomnia - Continue home meds  Nicotine  use disorder - Nicotine  patch and gum prn  Anxiety - PRN hydroxyzine    Divya Munshi Epilepsy [facility]

## 2024-08-17 NOTE — TOC CM/SW Note (Signed)
 Transition of Care [facility]) - Inpatient Brief Assessment   Patient Details  Name: John Vaughan MRN: [id number] Date of Birth: March 27, 1991  [facility] of [facility]) CM/SW Contact:    Andrez JULIANNA George, RN Phone Number: 08/17/2024, 2:06 PM   Clinical Narrative:  Pt admitted to EMU. No current IP Care management needs. If pt has needs that arise please place consult.  Transition of Care Asessment: Insurance and Status: Insurance coverage has been reviewed Patient has primary care physician: Yes Home environment has been reviewed: home   Prior/Current Home Services: No current home services Social Drivers of Health Review: SDOH reviewed no interventions necessary Readmission risk has been reviewed: Yes Transition of care needs: no transition of care needs at this time

## 2024-08-17 NOTE — Plan of Care (Signed)

## 2024-08-17 NOTE — Progress Notes (Signed)
 Patient hooked up to CT compatible leads. [facility] monitoring. Test button tested.

## 2024-08-18 ENCOUNTER — Encounter (HOSPITAL_COMMUNITY): Payer: MEDICAID

## 2024-08-18 ENCOUNTER — Encounter (HOSPITAL_COMMUNITY): Payer: Self-pay | Admitting: Neurology

## 2024-08-18 ENCOUNTER — Other Ambulatory Visit: Payer: Self-pay

## 2024-08-18 ENCOUNTER — Inpatient Hospital Stay (HOSPITAL_COMMUNITY): Payer: MEDICAID

## 2024-08-18 DIAGNOSIS — R569 Unspecified convulsions: Secondary | ICD-10-CM | POA: Diagnosis not present

## 2024-08-18 DIAGNOSIS — F17200 Nicotine dependence, unspecified, uncomplicated: Secondary | ICD-10-CM | POA: Diagnosis not present

## 2024-08-18 MED ORDER — GABAPENTIN 100 MG PO CAPS: 100.0000 mg | ORAL_CAPSULE | Freq: Two times a day (BID) | ORAL | Status: DC

## 2024-08-18 MED ORDER — GABAPENTIN 100 MG PO CAPS: 100.0000 mg | ORAL_CAPSULE | Freq: Two times a day (BID) | ORAL | Status: DC | PRN

## 2024-08-18 NOTE — Progress Notes (Signed)
 vLTM maintenance  All impedances below 10k.  No skin breakdown noted at FP1  FP2  A1 A2.  Completed photic stimulation and hyperventilation

## 2024-08-18 NOTE — Progress Notes (Signed)
 Pt. Requested a Bible.  Bible was delivery. Also provided listening and emotional support. Chaplain available as needed.  Rayleen Levander BROCKS [place], [facility], Pager [phone]

## 2024-08-18 NOTE — Progress Notes (Signed)
 Subjective: No acute events overnight.  States this morning he noticed that his heart rate was around 48 and he felt dizzy at that time.  Gradually his heart rate improved and dizziness resolved.  Denies any other concerns.  ROS: negative except above  Examination  Vital signs in last 24 hours: Temp:  [97.5 F (36.4 C)-98.4 F (36.9 C)] 97.5 F (36.4 C) (08/26 0842) Pulse Rate:  [63-78] 63 (08/26 0842) Resp:  [18-19] 18 (08/26 0842) BP: (117-123)/(73-80) 117/73 (08/26 0842) SpO2:  [99 %-100 %] 100 % (08/26 0842) Weight:  [93.4 kg] 93.4 kg (08/26 0842)  General: lying in bed, NAD Neuro: MS: Alert, oriented, follows commands CN: pupils equal and reactive,  EOMI, face symmetric, tongue midline, normal sensation over face, Motor: 5/5 strength in all 4 extremities Coordination: normal Gait: not tested  Basic Metabolic Panel: Recent Labs  Lab 08/17/24 1241  NA 136  K 3.9  CL 104  CO2 21*  GLUCOSE 108*  BUN 14  CREATININE 0.93  CALCIUM 8.8*  MG 2.0  PHOS 3.7    CBC: Recent Labs  Lab 08/17/24 1241  WBC 3.2*  NEUTROABS 1.5*  HGB 12.4*  HCT 35.8*  MCV 99.2  PLT 234     Coagulation Studies: Recent Labs    08/17/24 1241  LABPROT 15.1  INR 1.1    Imaging No new brain imaging   ASSESSMENT AND PLAN:33yo M admitted to epilepsy monitoring unit for characterization of spells   Seizure like episodes - Continue video eeg monitoring - Hold all antiseizure medications today - HV and Photic stimulation as well as sleep deprivation tonight - PRN IV versed  - Seizure precautions -Discussed overnight EEG findings   Depression GERD Insomnia - Continue home meds   Nicotine  use disorder - Nicotine  patch and gum prn   Anxiety - PRN hydroxyzine     I personally spent a total of 36 minutes in the care of the patient today including getting/reviewing separately obtained history, performing a medically appropriate exam/evaluation, counseling and educating, placing  orders, referring and communicating with other health care professionals, documenting clinical information in the EHR, independently interpreting results, and coordinating care.         Arlin Krebs Epilepsy [facility] For questions after 5pm please refer to AMION to reach the Neurologist on call

## 2024-08-18 NOTE — Plan of Care (Signed)

## 2024-08-18 NOTE — Procedures (Signed)
 Patient Name: John Vaughan  MRN: [id number]  Epilepsy Attending: Arlin MALVA Krebs  Referring Physician/Provider: Krebs Arlin MALVA, MD  Duration: 08/17/2024 1107 to 08/18/2024 1107  Patient history: 32yo M admitted to epilepsy monitoring unit for characterization of spells. EEG to evaluate for seizure  Level of alertness: Awake, asleep  AEDs during EEG study: OXC, GBP  Technical aspects: This EEG study was done with scalp electrodes positioned according to the 10-20 International system of electrode placement. Electrical activity was reviewed with band pass filter of 1-70Hz , sensitivity of 7 uV/mm, display speed of 55mm/sec with a 60Hz  notched filter applied as appropriate. EEG data were recorded continuously and digitally stored.  Video monitoring was available and reviewed as appropriate.  Description: The posterior dominant rhythm consists of 8-9 Hz activity of moderate voltage (25-35 uV) seen predominantly in posterior head regions, symmetric and reactive to eye opening and eye closing. Sleep was characterized by vertex waves, sleep spindles (12 to 14 Hz), maximal frontocentral region. Hyperventilation and photic stimulation were not performed.     IMPRESSION: This study is within normal limits. No seizures or epileptiform discharges were seen throughout the recording.  A normal interictal EEG does not exclude the diagnosis of epilepsy.   Jeanise Durfey O Kawehi Hostetter

## 2024-08-19 ENCOUNTER — Inpatient Hospital Stay (HOSPITAL_COMMUNITY): Payer: MEDICAID

## 2024-08-19 DIAGNOSIS — R569 Unspecified convulsions: Secondary | ICD-10-CM | POA: Diagnosis not present

## 2024-08-19 DIAGNOSIS — R001 Bradycardia, unspecified: Secondary | ICD-10-CM

## 2024-08-19 MED ORDER — ONDANSETRON HCL 4 MG/2ML IJ SOLN
4.0000 mg | Freq: Four times a day (QID) | INTRAMUSCULAR | Status: DC | PRN
  Administered 2024-08-19: 4 mg via INTRAVENOUS
  Filled 2024-08-19: qty 2

## 2024-08-19 NOTE — Progress Notes (Signed)
 LTM maint complete - no skin breakdown seen Serviced O1 T3 F7 Monitored by [facility]

## 2024-08-19 NOTE — Consult Note (Addendum)
 Cardiology Consultation  Patient ID: John Vaughan MRN: [id number]; DOB: December 12, 1991  Admit date: 08/17/2024 Date of Consult: 08/19/2024  PCP:  Mavis Redge SAILOR, FNP   [facility] Providers Cardiologist:  None     Patient Profile: John Vaughan is a 33 y.o. male with a hx of multiple psychiatric disorders, alcohol use disorder (quit 08/2023), GERD, neuropathy who is being seen 08/19/2024 for the evaluation of bradycardia at the request of Dr. Shelton.  History of Present Illness: John Vaughan has past medical history as listed above. He was admitted to epilepsy monitoring unit after reported history of seizure-like activity. He reported these spells began around age 70 but had seizures in the setting of alcohol withdrawal a few years back. He reported that the spells he has are occurring daily, last anywhere from 30-60 seconds where he reported he blacks out and then sleeps for the rest of the day. He reported generalized tonic clonic seizures happening monthly, the last one occurring few days prior to admission. He reports generalized shaking, tongue biting, and urinary incontinence.   Neurology has been following the patient. He has been monitored on EEGs, his most recent showing no seizure or epileptiform discharges. They reached out to cardiology to have us  evaluate him in the setting of bradycardia. His EKG shows sinus rhythm with HR 66.   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   Past Surgical History:  Procedure Laterality Date   COLONOSCOPY  11/28/2023   HAND SURGERY Right    TYMPANOSTOMY TUBE PLACEMENT     UPPER GASTROINTESTINAL ENDOSCOPY  11/28/2023    WISDOM TOOTH EXTRACTION      Home Medications:  Prior to Admission medications   Medication Sig Start Date End Date Taking? Authorizing Provider  ARIPiprazole  (ABILIFY ) 5 MG tablet Take 1 tablet (5 mg total) by mouth daily. 07/27/23 02/03/36 Yes Massengill, Rankin, MD  busPIRone  (BUSPAR ) 10 MG tablet Take 10 mg by mouth daily. 10/01/23  Yes [provider]  cloBAZam  (ONFI ) 10 MG tablet Take 1 tablet (10 mg total) by mouth at bedtime. 07/09/24  Yes Gregg Lek, MD  gabapentin  (NEURONTIN ) 600 MG tablet Take 1 tablet (600 mg total) by mouth 3 (three) times daily. Patient taking differently: Take 600 mg by mouth 2 (two) times daily. 05/21/24  Yes Gregg Lek, MD  hydrOXYzine  (ATARAX ) 50 MG tablet Take 50 mg by mouth as needed for anxiety, itching, nausea or vomiting. 10/01/23  Yes [provider]  nicotine  (NICODERM CQ  - DOSED IN MG/24 HOURS) 21 mg/24hr patch Place 21 mg onto the skin daily. 11/27/23  Yes [provider]  ondansetron  (ZOFRAN -ODT) 8 MG disintegrating tablet Take 8 mg by mouth every 8 (eight) hours as needed for nausea, vomiting or refractory nausea / vomiting. 08/14/23  Yes [provider]  oxcarbazepine  (TRILEPTAL ) 600 MG tablet Take 1.5 tablets (900 mg total) by mouth 2 (two) times daily. 05/21/24 12/17/24 Yes Gregg Lek, MD  pantoprazole  (PROTONIX ) 40 MG tablet Take 1 tablet (40 mg total) by mouth 2 (two) times daily before a meal. Take  30 minutes before breakfast and dinner 10/14/23  Yes Kennedy-Smith, Colleen M, NP  Testosterone Undecanoate 100 MG CAPS Take 300 mg by mouth daily at 12 noon.   Yes [provider]  traZODone  (DESYREL ) 150 MG tablet Take 1 tablet (150 mg total) by mouth at bedtime as needed for sleep. 07/26/23 02/02/25 Yes Massengill, Rankin, MD  vortioxetine  HBr (TRINTELLIX ) 10 MG TABS tablet Take 10 mg by mouth daily.   Yes [provider]  zonisamide  (ZONEGRAN ) 100 MG capsule Take 3 capsules (300 mg total) by mouth  at bedtime. 02/07/24 08/17/24 Yes Gregg Lek, MD    Scheduled Meds:  ARIPiprazole   5 mg Oral Daily   busPIRone   10 mg Oral Daily   enoxaparin  (LOVENOX ) injection  50 mg Subcutaneous Daily   nicotine   21 mg Transdermal Daily   pantoprazole   40 mg Oral BID   sodium chloride  flush  3 mL Intravenous Q12H   vortioxetine  HBr  10 mg Oral Daily   Continuous Infusions:  PRN Meds: acetaminophen  **OR** acetaminophen , gabapentin , hydrOXYzine , labetalol , midazolam , nicotine  polacrilex, ondansetron  (ZOFRAN ) IV  Allergies:   No Known Allergies  Social History:   Social History   Socioeconomic History   Marital status: Significant Other    Spouse name: elliott   Number of children: 1   Years of education: 12   Highest education level: 12th grade  Occupational History   Not on file  Tobacco Use   Smoking status: Every Day    Current packs/day: 1.00    Average packs/day: 0.8 packs/day for 31.1 years (23.6 ttl pk-yrs)    Types: Cigarettes    Start date: 07/20/2008   Smokeless tobacco: Former    Types: Snuff    Quit date: 07/21/2023  Vaping Use   Vaping status: Never Used  Substance and Sexual Activity   Alcohol use: Not Currently    Alcohol/week: 28.0 standard drinks of alcohol    Types: 28 Cans of beer per week    Comment: quit Sept 10 2024   Drug use: Not Currently    Types: Cocaine    Comment: last used cocaine 4 months 06/2023 ago, usually one gram used weekly   Sexual activity: Yes  Other Topics Concern   Not on file  Social History Narrative   Right Handed    No Caffeine Use    Social Drivers of Health   Financial Resource Strain: High Risk (05/19/2024)   Received from [facility]   Overall Financial Resource Strain (CARDIA)    Difficulty of Paying Living Expenses: Hard  Food Insecurity: No Food Insecurity (08/17/2024)   Hunger Vital Sign    Worried About Running Out of Food in the Last Year: Never true    Ran Out of Food in the Last Year: Never true  Recent  Concern: Food Insecurity - Food Insecurity Present (05/19/2024)   Received from [facility]   Hunger Vital Sign    Within the past 12 months, you worried that your food would run out before you got the money to buy more.: Often true    Within the past 12 months, the food you bought just didn't last and you didn't have money to get more.: Sometimes true  Transportation Needs: No Transportation Needs (08/17/2024)   PRAPARE - Administrator, Civil Service (Medical): No    Lack of Transportation (Non-Medical): No  Recent Concern: Transportation Needs - Unmet Transportation Needs (05/19/2024)   Received from [facility] - Transportation  Lack of Transportation (Medical): Yes    Lack of Transportation (Non-Medical): Yes  Physical Activity: Insufficiently Active (05/19/2024)   Received from [facility]   Exercise Vital Sign    On average, how many days per week do you engage in moderate to strenuous exercise (like a brisk walk)?: 5 days    On average, how many minutes do you engage in exercise at this level?: 10 min  Stress: Stress Concern Present (05/19/2024)   Received from [facility] - Occupational Stress Questionnaire    Feeling of Stress : Very much  Social Connections: Moderately Integrated (05/19/2024)   Received from [facility]   Social Network    How would you rate your social network (family, work, friends)?: Adequate participation with social networks  Intimate Partner Violence: Not At Risk (08/17/2024)   Humiliation, Afraid, Rape, and Kick questionnaire    Fear of Current or Ex-Partner: No    Emotionally Abused: No    Physically Abused: No    Sexually Abused: No    Family History:   Family History  Problem Relation Age of Onset   Anxiety disorder Mother    Interstitial cystitis Mother    COPD Mother    Heart disease Mother    Alcohol abuse Father    COPD Father    Seizures Father    Heart disease  Father    Anxiety disorder Maternal Aunt    Anxiety disorder Maternal Grandmother    Diabetes Maternal Grandmother    Heart disease Maternal Grandfather    Diabetes Paternal Grandmother    Alcohol abuse Paternal Grandfather    Heart disease Paternal Grandfather    Autism Son    ADD / ADHD Son    Colon cancer Neg Hx    Esophageal cancer Neg Hx    Rectal cancer Neg Hx    Stomach cancer Neg Hx     ROS:  Please see the history of present illness.  All other ROS reviewed and negative.     Physical Exam/Data: Vitals:   08/18/24 2000 08/19/24 0826 08/19/24 1008 08/19/24 1313  BP: 130/86 110/80 114/88   Pulse: (!) 54 (!) 46 (!) 54   Resp: 18 20 14 14   Temp: 97.8 F (36.6 C) 98.1 F (36.7 C) 97.8 F (36.6 C)   TempSrc: Oral Oral Oral   SpO2: 100% 99% 99%   Weight:      Height:       Intake/Output Summary (Last 24 hours) at 08/19/2024 1314 Last data filed at 08/18/2024 2105 Gross per 24 hour  Intake 3 ml  Output --  Net 3 ml      08/18/2024    8:42 AM 02/13/2024   12:47 PM 02/03/2024   11:26 AM  Last 3 Weights  Weight (lbs) 206 lb 230 lb 233 lb 8 oz  Weight (kg) 93.441 kg 104.327 kg 105.915 kg     Body mass index is 28.73 kg/m.   General:  Well nourished, well developed, in no acute distress HEENT: normal Neck: no JVD Vascular: No carotid bruits; Distal pulses 2+ bilaterally Cardiac:  normal S1, S2; RRR; no murmur  Lungs:  clear to auscultation bilaterally, no wheezing, rhonchi or rales  Abd: soft, nontender, no hepatomegaly  Ext: no edema Musculoskeletal:  No deformities, BUE and BLE strength normal and equal Skin: warm and dry  Neuro:  no focal abnormalities noted  EKG:  The EKG was personally reviewed and demonstrates:  sinus  rhythm, HR 66  Telemetry:  Telemetry was personally reviewed and demonstrates: sinus rhythm, HR 60-70s while in the room  Relevant CV Studies: N/A  Laboratory Data: High Sensitivity Troponin:  No results for input(s): TROPONINIHS in  the last 720 hours.   Chemistry Recent Labs  Lab 08/17/24 1241  NA 136  K 3.9  CL 104  CO2 21*  GLUCOSE 108*  BUN 14  CREATININE 0.93  CALCIUM 8.8*  MG 2.0  GFRNONAA >60  ANIONGAP 11    Recent Labs  Lab 08/17/24 1241  PROT 6.0*  ALBUMIN 3.2*  AST 57*  ALT 85*  ALKPHOS 69  BILITOT 1.0   Lipids No results for input(s): CHOL, TRIG, HDL, LABVLDL, LDLCALC, CHOLHDL in the last 168 hours.  Hematology Recent Labs  Lab 08/17/24 1241  WBC 3.2*  RBC 3.61*  HGB 12.4*  HCT 35.8*  MCV 99.2  MCH 34.3*  MCHC 34.6  RDW 13.7  PLT 234   Thyroid No results for input(s): TSH, FREET4 in the last 168 hours.  BNPNo results for input(s): BNP, PROBNP in the last 168 hours.  DDimer No results for input(s): DDIMER in the last 168 hours.  Radiology/Studies:  Overnight EEG with video Result Date: 08/18/2024 John Arlin KIDD, MD     08/19/2024  9:47 AM Patient Name: John Vaughan MRN: [id number] Epilepsy Attending: Arlin Vaughan John Referring Physician/Provider: Shelton Arlin KIDD, MD Duration: 08/17/2024 1107 to 08/18/2024 1107 Patient history: 32yo M admitted to epilepsy monitoring unit for characterization of spells. EEG to evaluate for seizure Level of alertness: Awake, asleep AEDs during EEG study: OXC, GBP Technical aspects: This EEG study was done with scalp electrodes positioned according to the 10-20 International system of electrode placement. Electrical activity was reviewed with band pass filter of 1-70Hz , sensitivity of 7 uV/mm, display speed of 63mm/sec with a 60Hz  notched filter applied as appropriate. EEG data were recorded continuously and digitally stored.  Video monitoring was available and reviewed as appropriate. Description: The posterior dominant rhythm consists of 8-9 Hz activity of moderate voltage (25-35 uV) seen predominantly in posterior head regions, symmetric and reactive to eye opening and eye closing. Sleep was characterized by vertex waves,  sleep spindles (12 to 14 Hz), maximal frontocentral region.  Hyperventilation did not show any EEG change. Hyperventilation and photic stimulation were not performed.   IMPRESSION: This study is within normal limits. No seizures or epileptiform discharges were seen throughout the recording. A normal interictal EEG does not exclude the diagnosis of epilepsy. John Vaughan   Assessment and Plan: Sinus bradycardia Consulted in the setting of bradycardia Patient reports HR in 30-40s while resting  Baseline HR while in the room was 60-70s  EKG and telemetry show sinus rhythm without significant arrhythmias/pauses  Patient reports chronic symptoms that have been occurring since age 56  Continue to monitor while inpatient, nothing concerning at this time   Per primary Seizure like episodes  Mood disorders GERD  Risk Assessment/Risk Scores:        For questions or updates, please contact [facility] Please consult [email or web address] for contact info under    Signed, John DELENA Donath, PA-C  08/19/2024 1:14 PM  ATTENDING ATTESTATION:  After conducting a review of all available clinical information with the care team, interviewing the patient, and performing a physical exam, I agree with the findings and plan described in this note.   GEN: No acute distress, AO x 3 HEENT:  MMM, no JVD, no scleral icterus  Cardiac: RRR, no murmurs, rubs, or gallops.  Respiratory: Clear to auscultation bilaterally. GI: Soft, nontender, non-distended  MS: No edema; No deformity. Neuro:  Nonfocal  Vasc:  +2 radial pulses  The patient is a 33 year old male with multiple psychiatric issues including alcohol use disorder, ADHD, depression, and previous substance abuse who was admitted to evaluate for seizures.  Overnight his telemetry was notable for heart rate in the 30s to 40s.  He tells me that he had an episode of atypical right-sided chest discomfort that was quite severe.  At this time he noted  that his heart rate was in the 30s.  Since that episode he has had no recurrent chest discomfort.  Review of his telemetry demonstrates heart rate now in the 50s to 60s.  The patient reports chronic symptoms of lightheadedness that has been longstanding.    I believe that the patient's heart rate at night is due to parasympathetic out surge.  This is a normal phenomenon.  The patient's heart rate of 30s while he was having atypical chest pain is likely due to high vagal tone due to his discomfort.  Would continue to monitor on telemetry.  No further evaluation is necessary.  Lurena Red, MD Pager [phone]

## 2024-08-19 NOTE — Procedures (Signed)
 Patient Name: John Vaughan  MRN: [id number]  Epilepsy Attending: Arlin MALVA Krebs  Referring Physician/Provider: Krebs Arlin MALVA, MD  Duration: 08/18/2024 1107 to 08/19/2024 1107   Patient history: 32yo M admitted to epilepsy monitoring unit for characterization of spells. EEG to evaluate for seizure   Level of alertness: Awake, asleep   AEDs during EEG study: None   Technical aspects: This EEG study was done with scalp electrodes positioned according to the 10-20 International system of electrode placement. Electrical activity was reviewed with band pass filter of 1-70Hz , sensitivity of 7 uV/mm, display speed of 8mm/sec with a 60Hz  notched filter applied as appropriate. EEG data were recorded continuously and digitally stored.  Video monitoring was available and reviewed as appropriate.   Description: The posterior dominant rhythm consists of 8-9 Hz activity of moderate voltage (25-35 uV) seen predominantly in posterior head regions, symmetric and reactive to eye opening and eye closing. Sleep was characterized by vertex waves, sleep spindles (12 to 14 Hz), maximal frontocentral region.  Hyperventilation did not show any EEG change.  Physiologic photic driving was not seen during photic stimulation.    During photic stimulation on 08/18/2024 at 1233, patient had eye fluttering, then stated that he did not feel well, felt dizzy, stopped responding. Then during HV, he had non- rhythmic right arm movements and didn't respond. Concomitant eeg before, during and after the events didn't show any eeg change to suggest seizure   IMPRESSION: This study is within normal limits. No seizures or epileptiform discharges were seen throughout the recording.  One event was recorded as described above on 08/18/2024 at 1233 without concomitant eeg change. This was a NON epileptic event.   A normal interictal EEG does not exclude the diagnosis of epilepsy.     Kimetha Trulson O Jennessy Sandridge

## 2024-08-19 NOTE — Progress Notes (Signed)
 EEG maint complete. No SBD at a2 t6 o1

## 2024-08-19 NOTE — Plan of Care (Signed)
  Problem: Education: Goal: Knowledge of General Education information will improve Description: Including pain rating scale, medication(s)/side effects and non-pharmacologic comfort measures Outcome: Progressing   Problem: Clinical Measurements: Goal: Respiratory complications will improve Outcome: Progressing Goal: Cardiovascular complication will be avoided Outcome: Progressing   Problem: Activity: Goal: Risk for activity intolerance will decrease Outcome: Progressing   Problem: Nutrition: Goal: Adequate nutrition will be maintained Outcome: Progressing   Problem: Coping: Goal: Level of anxiety will decrease Outcome: Progressing   Problem: Elimination: Goal: Will not experience complications related to bowel motility Outcome: Progressing Goal: Will not experience complications related to urinary retention Outcome: Progressing   Problem: Skin Integrity: Goal: Risk for impaired skin integrity will decrease Outcome: Progressing   Problem: Self-Concept: Goal: Level of anxiety will decrease Outcome: Progressing Goal: Ability to verbalize feelings about condition will improve Outcome: Progressing

## 2024-08-19 NOTE — Progress Notes (Signed)
 Subjective: No further seizure-like episodes overnight.  Had an episode of vomiting this morning.  Of note patient states his heart rate has been dropping down to 30s.  ROS: negative except above  Examination  Vital signs in last 24 hours: Temp:  [97.8 F (36.6 C)-98.1 F (36.7 C)] 97.8 F (36.6 C) (08/27 1008) Pulse Rate:  [46-54] 54 (08/27 1008) Resp:  [14-20] 14 (08/27 1008) BP: (110-130)/(80-88) 114/88 (08/27 1008) SpO2:  [99 %-100 %] 99 % (08/27 1008)  General: lying in bed, NAD Neuro: MS: Alert, oriented, follows commands CN: pupils equal and reactive,  EOMI, face symmetric, tongue midline, normal sensation over face, Motor: 5/5 strength in all 4 extremities Coordination: normal Gait: not tested  Basic Metabolic Panel: Recent Labs  Lab 08/17/24 1241  NA 136  K 3.9  CL 104  CO2 21*  GLUCOSE 108*  BUN 14  CREATININE 0.93  CALCIUM 8.8*  MG 2.0  PHOS 3.7    CBC: Recent Labs  Lab 08/17/24 1241  WBC 3.2*  NEUTROABS 1.5*  HGB 12.4*  HCT 35.8*  MCV 99.2  PLT 234     Coagulation Studies: Recent Labs    08/17/24 1241  LABPROT 15.1  INR 1.1    Imaging No new brain imaging     ASSESSMENT AND PLAN:33yo M admitted to epilepsy monitoring unit for characterization of spells   Seizure like episodes - Continue video eeg monitoring - Hold all antiseizure medications today - PRN IV versed  - Seizure precautions -Discussed overnight EEG findings   Depression GERD Insomnia - Continue home meds   Nicotine  use disorder - Nicotine  patch and gum prn   Anxiety - PRN hydroxyzine   Vomiting -ordered as needed Zofran   Transaminitis - Appears to be improving compared to last week on Friday.  Defer to PCP for further management  Bradycardia -EKG normal sinus rhythm with sinus arrhythmia - Will discuss with cardiology    I personally spent a total of 38 minutes in the care of the patient today including getting/reviewing separately obtained history,  performing a medically appropriate exam/evaluation, counseling and educating, placing orders, referring and communicating with other health care professionals, documenting clinical information in the EHR, independently interpreting results, and coordinating care.          Arlin Krebs Epilepsy [facility] For questions after 5pm please refer to AMION to reach the Neurologist on call

## 2024-08-19 NOTE — Plan of Care (Signed)
  Problem: Clinical Measurements: Goal: Ability to maintain clinical measurements within normal limits will improve Outcome: Progressing Goal: Will remain free from infection Outcome: Progressing Goal: Diagnostic test results will improve Outcome: Progressing Goal: Respiratory complications will improve Outcome: Progressing Goal: Cardiovascular complication will be avoided Outcome: Progressing   Problem: Activity: Goal: Risk for activity intolerance will decrease Outcome: Progressing   Problem: Nutrition: Goal: Adequate nutrition will be maintained Outcome: Progressing   Problem: Elimination: Goal: Will not experience complications related to bowel motility Outcome: Progressing Goal: Will not experience complications related to urinary retention Outcome: Progressing   Problem: Safety: Goal: Ability to remain free from injury will improve Outcome: Progressing   Problem: Skin Integrity: Goal: Risk for impaired skin integrity will decrease Outcome: Progressing   Problem: Coping: Goal: Ability to adjust to condition or change in health will improve Outcome: Progressing Goal: Ability to identify appropriate support needs will improve Outcome: Progressing   Problem: Education: Goal: Expressions of having a comfortable level of knowledge regarding the disease process will increase Outcome: Progressing

## 2024-08-20 ENCOUNTER — Inpatient Hospital Stay (HOSPITAL_COMMUNITY): Payer: MEDICAID

## 2024-08-20 DIAGNOSIS — R001 Bradycardia, unspecified: Secondary | ICD-10-CM | POA: Diagnosis not present

## 2024-08-20 DIAGNOSIS — R569 Unspecified convulsions: Secondary | ICD-10-CM

## 2024-08-20 MED ORDER — TRAZODONE HCL 50 MG PO TABS
150.0000 mg | ORAL_TABLET | Freq: Once | ORAL | Status: AC
  Administered 2024-08-20: 150 mg via ORAL
  Filled 2024-08-20: qty 1

## 2024-08-20 MED ORDER — OXCARBAZEPINE 300 MG PO TABS
900.0000 mg | ORAL_TABLET | Freq: Two times a day (BID) | ORAL | Status: DC
  Administered 2024-08-20 – 2024-08-21 (×2): 900 mg via ORAL
  Filled 2024-08-20 (×2): qty 3

## 2024-08-20 MED ORDER — ZONISAMIDE 100 MG PO CAPS
300.0000 mg | ORAL_CAPSULE | Freq: Every day | ORAL | Status: DC
  Administered 2024-08-20: 300 mg via ORAL
  Filled 2024-08-20: qty 3

## 2024-08-20 MED ORDER — GABAPENTIN 300 MG PO CAPS
600.0000 mg | ORAL_CAPSULE | Freq: Three times a day (TID) | ORAL | Status: DC
  Administered 2024-08-20 – 2024-08-21 (×2): 600 mg via ORAL
  Filled 2024-08-20 (×2): qty 2

## 2024-08-20 NOTE — Progress Notes (Signed)
 EMU LTM maint complete - no skin breakdown under:C3,T6.

## 2024-08-20 NOTE — Plan of Care (Signed)
  Problem: Clinical Measurements: Goal: Ability to maintain clinical measurements within normal limits will improve Outcome: Progressing Goal: Will remain free from infection Outcome: Progressing Goal: Diagnostic test results will improve Outcome: Progressing Goal: Respiratory complications will improve Outcome: Progressing Goal: Cardiovascular complication will be avoided Outcome: Progressing   Problem: Activity: Goal: Risk for activity intolerance will decrease Outcome: Progressing   Problem: Nutrition: Goal: Adequate nutrition will be maintained Outcome: Progressing   Problem: Elimination: Goal: Will not experience complications related to bowel motility Outcome: Progressing Goal: Will not experience complications related to urinary retention Outcome: Progressing   Problem: Pain Managment: Goal: General experience of comfort will improve and/or be controlled Outcome: Progressing   Problem: Safety: Goal: Ability to remain free from injury will improve Outcome: Progressing   Problem: Skin Integrity: Goal: Risk for impaired skin integrity will decrease Outcome: Progressing   Problem: Education: Goal: Expressions of having a comfortable level of knowledge regarding the disease process will increase Outcome: Progressing

## 2024-08-20 NOTE — TOC Progression Note (Signed)
 Transition of Care [facility]) - Progression Note    Patient Details  Name: John Vaughan MRN: [id number] Date of Birth: 1991-09-23  [facility] CM/SW Contact  Andrez JULIANNA George, RN Phone Number: 08/20/2024, 1:08 PM  Clinical Narrative:     Plan is for d/c home tomorrow.    Expected Discharge Plan: Home/Self Care                 Expected Discharge Plan and Services                                               Social Drivers of Health (SDOH) Interventions SDOH Screenings   Food Insecurity: No Food Insecurity (08/17/2024)  Recent Concern: Food Insecurity - Food Insecurity Present (05/19/2024)   Received from [facility]  Housing: Unknown (05/19/2024)   Received from [facility]  Transportation Needs: No Transportation Needs (08/17/2024)  Recent Concern: Transportation Needs - Unmet Transportation Needs (05/19/2024)   Received from [facility]  Utilities: Not At Risk (08/17/2024)  Recent Concern: Utilities - At Risk (05/19/2024)   Received from [facility]  Alcohol Screen: High Risk (07/21/2023)  Depression (PHQ2-9): High Risk (07/29/2023)  Financial Resource Strain: High Risk (05/19/2024)   Received from [facility]  Physical Activity: Insufficiently Active (05/19/2024)   Received from [facility]  Social Connections: Moderately Integrated (05/19/2024)   Received from [facility]  Stress: Stress Concern Present (05/19/2024)   Received from [facility]  Tobacco Use: High Risk (08/18/2024)    Readmission Risk Interventions     No data to display

## 2024-08-20 NOTE — Procedures (Signed)
 Patient Name: John Vaughan  MRN: [id number]  Epilepsy Attending: Arlin MALVA Krebs  Referring Physician/Provider: Krebs Arlin MALVA, MD  Duration: 08/19/2024 1107 to 08/20/2024 1107   Patient history: 32yo M admitted to epilepsy monitoring unit for characterization of spells. EEG to evaluate for seizure   Level of alertness: Awake, asleep   AEDs during EEG study: None   Technical aspects: This EEG study was done with scalp electrodes positioned according to the 10-20 International system of electrode placement. Electrical activity was reviewed with band pass filter of 1-70Hz , sensitivity of 7 uV/mm, display speed of 40mm/sec with a 60Hz  notched filter applied as appropriate. EEG data were recorded continuously and digitally stored.  Video monitoring was available and reviewed as appropriate.   Description: The posterior dominant rhythm consists of 8-9 Hz activity of moderate voltage (25-35 uV) seen predominantly in posterior head regions, symmetric and reactive to eye opening and eye closing. Sleep was characterized by vertex waves, sleep spindles (12 to 14 Hz), maximal frontocentral region.     One event was recorded on 08/20/2024 at 0234 for unclear reasons.  On video, patient appeared to be laying in bed with eyes open and looking around.  This morning patient denies knowing when her body pressed the button.  Concomitant eeg before, during and after the events didn't show any eeg change to suggest seizure    IMPRESSION: This study is within normal limits. No seizures or epileptiform discharges were seen throughout the recording.   One event was recorded as described above on 08/20/2024 at 0234 without concomitant eeg change. This was a NON epileptic event.   A normal interictal EEG does not exclude the diagnosis of epilepsy.     [name] O [name]

## 2024-08-20 NOTE — Plan of Care (Signed)
  Problem: Education: Goal: Knowledge of General Education information will improve Description: Including pain rating scale, medication(s)/side effects and non-pharmacologic comfort measures Outcome: Progressing   Problem: Health Behavior/Discharge Planning: Goal: Ability to manage health-related needs will improve Outcome: Progressing   Problem: Clinical Measurements: Goal: Ability to maintain clinical measurements within normal limits will improve Outcome: Progressing Goal: Will remain free from infection Outcome: Progressing Goal: Diagnostic test results will improve Outcome: Progressing Goal: Respiratory complications will improve Outcome: Progressing   Problem: Activity: Goal: Risk for activity intolerance will decrease Outcome: Progressing   Problem: Nutrition: Goal: Adequate nutrition will be maintained Outcome: Progressing   Problem: Coping: Goal: Level of anxiety will decrease Outcome: Progressing   Problem: Elimination: Goal: Will not experience complications related to bowel motility Outcome: Progressing Goal: Will not experience complications related to urinary retention Outcome: Progressing   Problem: Pain Managment: Goal: General experience of comfort will improve and/or be controlled Outcome: Progressing   Problem: Safety: Goal: Ability to remain free from injury will improve Outcome: Progressing   Problem: Skin Integrity: Goal: Risk for impaired skin integrity will decrease Outcome: Progressing   Problem: Education: Goal: Expressions of having a comfortable level of knowledge regarding the disease process will increase Outcome: Progressing   Problem: Coping: Goal: Ability to adjust to condition or change in health will improve Outcome: Progressing Goal: Ability to identify appropriate support needs will improve Outcome: Progressing   Problem: Health Behavior/Discharge Planning: Goal: Compliance with prescribed medication regimen will  improve Outcome: Progressing   Problem: Medication: Goal: Risk for medication side effects will decrease Outcome: Progressing   Problem: Clinical Measurements: Goal: Complications related to the disease process, condition or treatment will be avoided or minimized Outcome: Progressing Goal: Diagnostic test results will improve Outcome: Progressing   Problem: Safety: Goal: Verbalization of understanding the information provided will improve Outcome: Progressing   Problem: Self-Concept: Goal: Level of anxiety will decrease Outcome: Progressing Goal: Ability to verbalize feelings about condition will improve Outcome: Progressing

## 2024-08-20 NOTE — Progress Notes (Signed)
 Pt requesting his home trazodone  for sleep. Will give one time dose tonight. AM team can start scheduled or PRN if needed.  Elizbeth Posa [facility]

## 2024-08-20 NOTE — Progress Notes (Signed)
  Progress Note  Patient Name: John Vaughan Date of Encounter: 08/20/2024 [facility] Cardiologist: None   Interval Summary   Patient seen as he was nauseated all day yesterday.  His heart rates were in the 40s and 30s at times during waking hours.  Overnight his telemetry demonstrates heart rates in the 40s and high 30s.  No acute events.  Is lightheaded when he goes from supine to sitting.  Vital Signs Vitals:   08/19/24 1800 08/19/24 1846 08/19/24 2223 08/20/24 0725  BP:   106/65 94/63  Pulse:   (!) 54 (!) 50  Resp: 11 13  14   Temp:   98.3 F (36.8 C) 97.8 F (36.6 C)  TempSrc:   Oral Oral  SpO2:   97% 99%  Weight:      Height:        Intake/Output Summary (Last 24 hours) at 08/20/2024 1123 Last data filed at 08/19/2024 2108 Gross per 24 hour  Intake 3 ml  Output --  Net 3 ml      08/18/2024    8:42 AM 02/13/2024   12:47 PM 02/03/2024   11:26 AM  Last 3 Weights  Weight (lbs) 206 lb 230 lb 233 lb 8 oz  Weight (kg) 93.441 kg 104.327 kg 105.915 kg      Telemetry/ECG  Sinus rhythm and sinus bradycardia as above- Personally Reviewed  Physical Exam  GEN: No acute distress.   Neck: No JVD Cardiac: RRR, no murmurs, rubs, or gallops.  Respiratory: Clear to auscultation bilaterally. GI: Soft, nontender, non-distended  MS: No edema  Assessment & Plan  Sinus bradycardia: The patient displays nocturnal parasympathetic out surgery which is normal.  Yesterday he tells me he was nauseated all day and had low heart rates.  Low heart rates are likely due to high vagal tone due to nausea yesterday.  He is on multiple medications that can cause bradycardia including Abilify , BuSpar , and nicotine  patch.  His as needed medications that can cause bradycardia include Neurontin , hydroxyzine .  Additionally his outpatient medicines including Zonegran , and Trileptal , which he is currently not on, can also cause bradycardia.   He is lightheaded when moving from the supine  position to sitting despite no change in his heart rate of 60s while on the monitor while I was at the bedside.  I do not think his heart rates are related to his chronic lightheadedness.  If it is possible to stop some of these medications this may address his bradycardia but again I do not think this will address his symptoms.  Cardiology will sign off, call with questions  [facility] will sign off.    For questions or updates, please contact [facility] Please consult [email or web address] for contact info under       Signed, Meztli Llanas K Jonathen Rathman, MD

## 2024-08-20 NOTE — Progress Notes (Signed)
 Subjective: States yesterday afternoon sometime between 2 to 3 PM he had some episodes of right upper extremity jerking.  No EEG changes were noted.  Denies any other concerns.   ROS: negative except above  Examination  Vital signs in last 24 hours: Temp:  [97.8 F (36.6 C)-98.3 F (36.8 C)] 97.8 F (36.6 C) (08/28 0725) Pulse Rate:  [50-54] 50 (08/28 0725) Resp:  [11-14] 14 (08/28 0725) BP: (94-106)/(63-65) 94/63 (08/28 0725) SpO2:  [97 %-99 %] 99 % (08/28 0725)  General: lying in bed, NAD Neuro: MS: Alert, oriented, follows commands CN: pupils equal and reactive,  EOMI, face symmetric, tongue midline, normal sensation over face, Motor: 5/5 strength in all 4 extremities Coordination: normal Gait: not tested  Basic Metabolic Panel: Recent Labs  Lab 08/17/24 1241  NA 136  K 3.9  CL 104  CO2 21*  GLUCOSE 108*  BUN 14  CREATININE 0.93  CALCIUM 8.8*  MG 2.0  PHOS 3.7    CBC: Recent Labs  Lab 08/17/24 1241  WBC 3.2*  NEUTROABS 1.5*  HGB 12.4*  HCT 35.8*  MCV 99.2  PLT 234     Coagulation Studies: Recent Labs    08/17/24 1241  LABPROT 15.1  INR 1.1    Imaging No new brain imaging     ASSESSMENT AND PLAN:33yo M admitted to epilepsy monitoring unit for characterization of spells   Seizure like episodes - Continue video eeg monitoring - Resume antiseizure medications tonight - PRN IV versed  - Seizure precautions -Discussed overnight EEG findings -Discussed potential diagnosis of nonepileptic spells as well as cognitive behavioral therapy   Depression GERD Insomnia - Continue home meds   Nicotine  use disorder - Nicotine  patch and gum prn   Anxiety - PRN hydroxyzine   Bradycardia Appreciate cardiology consult -Will gradually try to wean off antiseizure medications as an outpatient     I personally spent a total of 38 minutes in the care of the patient today including getting/reviewing separately obtained history, performing a medically  appropriate exam/evaluation, counseling and educating, placing orders, referring and communicating with other health care professionals, documenting clinical information in the EHR, independently interpreting results, and coordinating care.       Arlin Krebs Epilepsy [facility] For questions after 5pm please refer to AMION to reach the Neurologist on call

## 2024-08-21 ENCOUNTER — Encounter (HOSPITAL_COMMUNITY): Payer: MEDICAID

## 2024-08-21 ENCOUNTER — Inpatient Hospital Stay (HOSPITAL_COMMUNITY): Payer: MEDICAID

## 2024-08-21 DIAGNOSIS — R569 Unspecified convulsions: Secondary | ICD-10-CM | POA: Diagnosis not present

## 2024-08-21 MED ORDER — GABAPENTIN 600 MG PO TABS
600.0000 mg | ORAL_TABLET | Freq: Two times a day (BID) | ORAL | Status: AC
Start: 2024-08-21 — End: ?

## 2024-08-21 NOTE — Progress Notes (Signed)
 SLP Cancellation Note  Patient Details Name: John Vaughan MRN: [id number] DOB: 1991-03-28   Cancelled treatment:        Attempted to see pt for cognitive linguistic evaluation.  Spoke with nurse. Discharge completed.  Pt is leaving imminently. Consider further assessment at next level of care if indicated.    Anette FORBES Grippe, MA, CCC-SLP Acute Rehabilitation Services Office: [phone] 08/21/2024, 10:10 AM

## 2024-08-21 NOTE — Discharge Summary (Signed)
 Physician Discharge Summary  Patient ID: John Vaughan MRN: [id number] DOB/AGE: 07/31/1991 32 y.o.  Admit date: 08/17/2024 Discharge date: 08/21/2024  Admission Diagnoses: Seizure  Discharge Diagnoses: Seizure  Discharged Condition: stable  Hospital Course: John Vaughan was admitted to epilepsy monitoring unit between 08/17/2024 to 08/21/2024.  During this time, he underwent continuous video EEG monitoring.  Antiseizure medications were held.  Hyperventilation, photic stimulation and sleep deprivation were performed.  2 typical events were recorded as described in the EEG report without concomitant EEG change.  These events were nonepileptic.  Rest of the EEG was within normal limits.  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  We also discussed that there is a small chance he may have coexisting epilepsy as well as nonepileptic events.  Therefore I discussed with fianc at bedside to try and take video of his episode if possible after he is in a safe position.  His home antiseizure medications were resumed.  Seizure precautions including no driving were discussed  Initially during the hospital stay we noted that he was bradycardic.  Therefore cardiology was consulted.  Cardiology recommended trying to wean off some of his psychiatric as well as antiseizure medications as they could can contribute to bradycardia.  No further workup was recommended.  Patient would like a second opinion.  I recommend scheduling an outpatient cardiology follow-up.  Consults: cardiology  Significant Diagnostic Studies: Video EEG  Description: The posterior dominant rhythm consists of 8-9 Hz activity of moderate voltage (25-35 uV) seen predominantly in posterior head regions, symmetric and reactive to eye opening and eye closing. Sleep was characterized by vertex waves, sleep spindles (12 to 14 Hz), maximal frontocentral region.   Hyperventilation did not show  any EEG change.  Physiologic photic driving was not seen during photic stimulation.     During photic stimulation on 08/18/2024 at 1233, patient had eye fluttering, then stated that he did not feel well, felt dizzy, stopped responding. Then during HV, he had non- rhythmic right arm movements and didn't respond. Concomitant eeg before, during and after the events didn't show any eeg change to suggest seizure  One event was recorded on 08/20/2024 at 0234 for unclear reasons.  On video, patient appeared to be laying in bed with eyes open and looking around.  This morning patient denies knowing when her body pressed the button.  Concomitant eeg before, during and after the events didn't show any eeg change to suggest seizure     IMPRESSION: This study is within normal limits. No seizures or epileptiform discharges were seen throughout the recording.  One event was recorded as described above on 08/18/2024 at 1233 without concomitant eeg change. This was a NON epileptic event.     One event was recorded as described above on 08/20/2024 at 0234 without concomitant eeg change. This was a NON epileptic event.     A normal interictal EEG does not exclude the diagnosis of epilepsy.    Treatments: Continue home dose of gabapentin , oxcarbazepine  and zonisamide   Discharge Exam: Blood pressure 100/67, pulse (!) 45, temperature 98.6 F (37 C), resp. rate 16, height 5' 11 (1.803 m), weight 93.4 kg, SpO2 100%.  General: lying in bed, NAD Neuro: MS: Alert, oriented, follows commands CN: pupils equal and reactive,  EOMI, face symmetric, tongue midline, normal sensation over face, Motor: 5/5 strength in all 4 extremities Coordination: normal Gait: not tested   Discharge disposition: 01-Home or Self Care   Discharge Instructions  Call MD for:   Complete by: As directed    If patient has another seizure, call 911 and bring them back to the ED if: A.  The seizure lasts longer than 5 minutes.       B.  The patient doesn't wake shortly after the seizure or has new problems such as difficulty seeing, speaking or moving following the seizure C.  The patient was injured during the seizure D.  The patient has a temperature over 102 F (39C) E.  The patient vomited during the seizure and now is having trouble breathing   Diet - low sodium heart healthy   Complete by: As directed    Discharge instructions   Complete by: As directed      During the Seizure   - First, ensure adequate ventilation and place patients on the floor on their left side  Loosen clothing around the neck and ensure the airway is patent. If the patient is clenching the teeth, do not force the mouth open with any object as this can cause severe damage - Remove all items from the surrounding that can be hazardous. The patient may be oblivious to what's happening and may not even know what he or she is doing. If the patient is confused and wandering, either gently guide him/her away and block access to outside areas - Reassure the individual and be comforting - Call 911. In most cases, the seizure ends before EMS arrives. However, there are cases when seizures may last over 3 to 5 minutes. Or the individual may have developed breathing difficulties or severe injuries. If a pregnant patient or a person with diabetes develops a seizure, it is prudent to call an ambulance.    After the Seizure (Postictal Stage)   After a seizure, most patients experience confusion, fatigue, muscle pain and/or a headache. Thus, one should permit the individual to sleep. For the next few days, reassurance is essential. Being calm and helping reorient the person is also of importance.   Most seizures are painless and end spontaneously. Seizures are not harmful to others but can lead to complications such as stress on the lungs, brain and the heart. Individuals with prior lung problems may develop labored breathing and respiratory distress.         Increase activity slowly   Complete by: As directed    Other Restrictions   Complete by: As directed    Seizure precautions: Per [place] statutes, patients with seizures are not allowed to drive until they have been seizure-free for six months and cleared by a physician    Use caution when using heavy equipment or power tools. Avoid working on ladders or at heights. Take showers instead of baths. Ensure the water  temperature is not too high on the home water  heater. Do not go swimming alone. Do not lock yourself in a room alone (i.e. bathroom). When caring for infants or small children, sit down when holding, feeding, or changing them to minimize risk of injury to the child in the event you have a seizure. Maintain good sleep hygiene. Avoid alcohol.         Allergies as of 08/21/2024   No Known Allergies      Medication List     TAKE these medications    ARIPiprazole  5 MG tablet Commonly known as: ABILIFY  Take 1 tablet (5 mg total) by mouth daily.   busPIRone  10 MG tablet Commonly known as: BUSPAR  Take 10 mg by mouth daily.  cloBAZam  10 MG tablet Commonly known as: ONFI  Take 1 tablet (10 mg total) by mouth at bedtime.   gabapentin  600 MG tablet Commonly known as: NEURONTIN  Take 1 tablet (600 mg total) by mouth 2 (two) times daily.   hydrOXYzine  50 MG tablet Commonly known as: ATARAX  Take 50 mg by mouth as needed for anxiety, itching, nausea or vomiting.   nicotine  21 mg/24hr patch Commonly known as: NICODERM CQ  - dosed in mg/24 hours Place 21 mg onto the skin daily.   ondansetron  8 MG disintegrating tablet Commonly known as: ZOFRAN -ODT Take 8 mg by mouth every 8 (eight) hours as needed for nausea, vomiting or refractory nausea / vomiting.   oxcarbazepine  600 MG tablet Commonly known as: TRILEPTAL  Take 1.5 tablets (900 mg total) by mouth 2 (two) times daily.   pantoprazole  40 MG tablet Commonly known as: PROTONIX  Take 1 tablet (40 mg total) by  mouth 2 (two) times daily before a meal. Take 30 minutes before breakfast and dinner   Testosterone Undecanoate 100 MG Caps Take 300 mg by mouth daily at 12 noon.   traZODone  150 MG tablet Commonly known as: DESYREL  Take 1 tablet (150 mg total) by mouth at bedtime as needed for sleep.   vortioxetine  HBr 10 MG Tabs tablet Commonly known as: TRINTELLIX  Take 10 mg by mouth daily.   zonisamide  100 MG capsule Commonly known as: ZONEGRAN  Take 3 capsules (300 mg total) by mouth at bedtime.         I personally spent a total of  36 minutes in the care of the patient today including getting/reviewing separately obtained history, performing a medically appropriate exam/evaluation, counseling and educating, placing orders, referring and communicating with other health care professionals, documenting clinical information in the EHR, independently interpreting results, and coordinating care.          Signed: Vern Guerette O Willean Schurman 08/21/2024, 8:46 AM

## 2024-08-21 NOTE — Discharge Instructions (Addendum)
 You were admitted to epilepsy monitoring unit between 08/17/2024 to 08/21/2024.  During this time, you underwent continuous video EEG monitoring.  Antiseizure medications were held.  Hyperventilation, photic stimulation and sleep deprivation were performed.  Two typical events were recorded during which no EEG change was noted.  Rest of your EEG was within normal limits.  Your home medications were resumed.  We discussed the diagnosis of nonepileptic spells versus coexisting epilepsy with nonepileptic events.  Recommend taking video of the episodes as well as encouraged cognitive behavioral therapy  During this admission we also noticed that you had been bradycardic.  Cardiology was consulted.  They recommend trying to wean off some of your psych and antiseizure medications.  This has been discussed with you.  I also recommend outpatient cardiology follow-up that you plan to schedule on your own

## 2024-08-21 NOTE — Plan of Care (Signed)
 Discussed discharge planned today. Declined nicotine  patch and Lovenox  this am.  Problem: Education: Goal: Knowledge of General Education information will improve Description: Including pain rating scale, medication(s)/side effects and non-pharmacologic comfort measures Outcome: Progressing   Problem: Activity: Goal: Risk for activity intolerance will decrease Outcome: Progressing   Problem: Nutrition: Goal: Adequate nutrition will be maintained Outcome: Progressing   Problem: Safety: Goal: Ability to remain free from injury will improve Outcome: Progressing   Problem: Skin Integrity: Goal: Risk for impaired skin integrity will decrease Outcome: Progressing

## 2024-08-21 NOTE — Progress Notes (Signed)
 LTM VIDEO EEG discontinued - no skin breakdown at [facility].

## 2024-08-21 NOTE — Procedures (Addendum)
 Patient Name: John Vaughan  MRN: [id number]  Epilepsy Attending: Arlin MALVA Krebs  Referring Physician/Provider: Krebs Arlin MALVA, MD  Duration: 08/20/2024 1107 to 08/21/2024 9150   Patient history: 32yo M admitted to epilepsy monitoring unit for characterization of spells. EEG to evaluate for seizure   Level of alertness: Awake, asleep   AEDs during EEG study: OXC, ZNS, GBP   Technical aspects: This EEG study was done with scalp electrodes positioned according to the 10-20 International system of electrode placement. Electrical activity was reviewed with band pass filter of 1-70Hz , sensitivity of 7 uV/mm, display speed of 76mm/sec with a 60Hz  notched filter applied as appropriate. EEG data were recorded continuously and digitally stored.  Video monitoring was available and reviewed as appropriate.   Description: The posterior dominant rhythm consists of 8-9 Hz activity of moderate voltage (25-35 uV) seen predominantly in posterior head regions, symmetric and reactive to eye opening and eye closing. Sleep was characterized by vertex waves, sleep spindles (12 to 14 Hz), maximal frontocentral region.     IMPRESSION: This study is within normal limits. No seizures or epileptiform discharges were seen throughout the recording.   A normal interictal EEG does not exclude the diagnosis of epilepsy.    Corinthian Kemler O Tawni Melkonian

## 2024-09-10 ENCOUNTER — Encounter: Payer: Self-pay | Admitting: Neurology

## 2024-09-10 ENCOUNTER — Telehealth: Payer: MEDICAID | Admitting: Neurology

## 2024-09-10 ENCOUNTER — Telehealth: Payer: Self-pay | Admitting: Neurology

## 2024-09-10 DIAGNOSIS — R569 Unspecified convulsions: Secondary | ICD-10-CM

## 2024-09-10 NOTE — Patient Instructions (Signed)
 Decrease Zonisamide  by 100 mg weekly for the next 2 weeks then stop the medication  Continue with Trileptal , Gabapentin  and Clobazam   At next visit, we will likely reduce both Trileptal  and Clobazam  with the goal of discontinuing Clobazam   Follow up in 6 to 8 months or sooner if worse

## 2024-09-10 NOTE — Progress Notes (Signed)
 [facility]  PATIENT: John Vaughan DOB: 12-Jun-1991  REFERRING DOCTOR OR PCP: Zane Bach, NP SOURCE: Patient, note from multiple emergency room visits, imaging and lab reports, MRI and CT personally reviewed.  _________________________________   HISTORICAL  CHIEF COMPLAINT:  Chief Complaint  Patient presents with   Seizures    Follow up    INTERVAL HISTORY 09/10/2024 John Vaughan was called today for follow-up, last visit was in July.  At that time we referred him to [facility] for capture and characterization.  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  This was discussed with patient and also discussed follow-up with psychiatry.  He tells me that he has not follow-up with psychiatry yet.  He remains on all 4 antiseizure medications including zonisamide  oxcarbazepine  and clobazam  and gabapentin .  The gabapentin  is mainly for his neuropathy.  He tells me that since discharge from the hospital, he had two brief seizures that he described as staring spell. He reports some increased stress related to finances, it is difficult to find a job due to his history of seizures.   Brief hospital Course:  John Vaughan was admitted to epilepsy monitoring unit between 08/17/2024 to 08/21/2024.  During this time, he underwent continuous video EEG monitoring.  Antiseizure medications were held.  Hyperventilation, photic stimulation and sleep deprivation were performed.  2 typical events were recorded as described in the EEG report without concomitant EEG change.  These events were nonepileptic.  Rest of the EEG was within normal limits.   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  We also discussed that there is a small chance he may have coexisting epilepsy as well as nonepileptic events.  Therefore I discussed with fianc at bedside to try and take video of his episode if  possible after he is in a safe position.  His home antiseizure medications were resumed.  Seizure precautions including no driving were discussed  LTM  08/20/2024   One event was recorded on 08/20/2024 at 0234 for unclear reasons.  On video, patient appeared to be laying in bed with eyes open and looking around.  This morning patient denies knowing when her body pressed the button.  Concomitant eeg before, during and after the events didn't show any eeg change to suggest seizure IMPRESSION: This study is within normal limits. No seizures or epileptiform discharges were seen throughout the recording. One event was recorded as described above on 08/20/2024 at 0234 without concomitant eeg change. This was a NON epileptic event.   LTM 08/16/2024 During photic stimulation on 08/18/2024 at 1233, patient had eye fluttering, then stated that he did not feel well, felt dizzy, stopped responding. Then during HV, he had non- rhythmic right arm movements and didn't respond. Concomitant eeg before, during and after the events didn't show any eeg change to suggest seizure IMPRESSION: This study is within normal limits. No seizures or epileptiform discharges were seen throughout the recording. One event was recorded as described above on 08/18/2024 at 1233 without concomitant eeg change. This was a NON epileptic event.   INTERVAL HISTORY 07/09/2024 Patient presents today for follow-up, he is alone. Last visit was in February 2025.  At that time, plan was to continue him on Trileptal  at Zonisamide .  We also obtained an ambulatory EEG which was normal. He tells me he continues to have seizures that he described as zoning out and are related to stress.  Lately it has  been good because he is able to spend time with his son.  He was not able to do so due to his alcoholism.  He has quit drinking and the last drink was in September 2024.  He called about 2 weeks ago complaining of multiple seizures.  At that time we have added  clobazam  but he has not started the medication.  I did explain to him that I suspect some of these events are non epileptic and if he continued to have seizure we will have to admit him to the EMU.   INTERVAL HISTORY 02/03/2024:  Patient presents today for follow-up, last visit was in August, at that time we have planned to obtain an ambulatory EEG but it has not been done yet.  They tell me that he has been sober for the past 5 months, has not had any alcohol.  Girlfriend told me that he was doing better, stayed months without seizure but now he has been under lots of stress and currently he is having on average seizure once a week, his typical staring spells.  Sometimes he will have episodes where he would become stiff and has some convulsion.  They denies any tongue biting, denies any urinary incontinence and no injuries.    INTERVAL HISTORY 07/29/2023:  Patient presented for follow-up, he is accompanied by girlfriend. Last visit was last month with Dr. Vear. Since then he had a EEG which which showed right frontal slowing. Trileptal  was added.  then he was recently admitted to the hospital for alcohol intoxication and suicidal ideation, discharged on July 28.  She reported being sober for the past past 9 days.  He has plan of discontinuing alcohol.  He does have a psychiatrist but has not joined any AA meeting.  Reports his last seizure was last Saturday, 3 days ago.  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  He is compliant with his Keppra  750 mg twice daily and Trileptal  300/600 mg.  He is also complaining of cognitive impairment . He is pending ambulatory EEG.    HISTORY OF PRESENT ILLNESS Dr. VEAR:  I had the pleasure seeing patient, John Vaughan, at [facility] for a neurologic consultation regarding his seizures.  He is a 33 year old man who has had seizures since age 57.  His first seizure was a convulsion and he started kicking while watching TV  and was unresponsive x many minutes.   A second spell a little bu later was also a convulsion and he hit his nose breaking it.   More recently he has both staring spells and convulsions, often with the convulsion following the staring spell.  At baseline before a few months ago, he would have 2/month on average.     He has been to the emergency room multiple times in the last 6 months and carries a diagnosis of seizures, pseudoseizures and alcohol abuse.    He has had multiple spells with both staring spells and convulsions in June.  Some spells started with staring spells and then convulsions.    Last week, he had > 20 spells and went to the ED.    He does not have much recall of the day.   His most recent emergency room visit was on 06/18/2023 and he was loaded with Keppra  and then placed on an outpatient dose of 750 mg p.o. twice daily.    He has been on Keppra  of/on x many years.   No seizure  in last 3 day.     He has never had an EEG.  MRI 08/26/2022 shows mild atrophy.    He has a long history of alcohol abuse regularly drinking > 24 beers a day.   He started drinking in his mid teens.   He is currently not drinking.  Seizures occur both with moderate to heavy alcohol use and with withdrawal.     He has had some head trauma but never hospitalized for head injuries.    He never had meningitis or encephalitis.    He reached developmental milestones at the right age according to his mom.   He has a long history of alcohol abuse and also has had suicide ideation.  He was in a psychiatric hospital in mid June for detox.  He reports numbness in his feet since 2018 or so.     Imaging: I personally reviewed the MRI of the brain from 08/26/2022.  It shows mild generalized cortical atrophy, more than would be expected at his age.  There was also mild cerebellar atrophy.  There were no acute findings.  CT scan of the head 06/24/2023 was unchanged   REVIEW OF SYSTEMS: Constitutional: No fevers, chills,  sweats, or change in appetite Eyes: No visual changes, double vision, eye pain Ear, nose and throat: No hearing loss, ear pain, nasal congestion, sore throat Cardiovascular: No chest pain, palpitations Respiratory:  No shortness of breath at rest or with exertion.   No wheezes GastrointestinaI: No nausea, vomiting, diarrhea, abdominal pain, fecal incontinence Genitourinary:  No dysuria, urinary retention or frequency.  No nocturia. Musculoskeletal:  No neck pain, back pain Integumentary: No rash, pruritus, skin lesions Neurological: as above Psychiatric: No depression at this time.  No anxiety Endocrine: No palpitations, diaphoresis, change in appetite, change in weigh or increased thirst Hematologic/Lymphatic:  No anemia, purpura, petechiae. Allergic/Immunologic: No itchy/runny eyes, nasal congestion, recent allergic reactions, rashes  ALLERGIES: No Known Allergies   HOME MEDICATIONS:  Current Outpatient Medications:    ARIPiprazole  (ABILIFY ) 5 MG tablet, Take 1 tablet (5 mg total) by mouth daily., Disp: 30 tablet, Rfl: 0   busPIRone  (BUSPAR ) 10 MG tablet, Take 10 mg by mouth daily., Disp: , Rfl:    cloBAZam  (ONFI ) 10 MG tablet, Take 1 tablet (10 mg total) by mouth at bedtime., Disp: 30 tablet, Rfl: 5   gabapentin  (NEURONTIN ) 600 MG tablet, Take 1 tablet (600 mg total) by mouth 2 (two) times daily., Disp: , Rfl:    hydrOXYzine  (ATARAX ) 50 MG tablet, Take 50 mg by mouth as needed for anxiety, itching, nausea or vomiting., Disp: , Rfl:    nicotine  (NICODERM CQ  - DOSED IN MG/24 HOURS) 21 mg/24hr patch, Place 21 mg onto the skin daily., Disp: , Rfl:    ondansetron  (ZOFRAN -ODT) 8 MG disintegrating tablet, Take 8 mg by mouth every 8 (eight) hours as needed for nausea, vomiting or refractory nausea / vomiting., Disp: , Rfl:    oxcarbazepine  (TRILEPTAL ) 600 MG tablet, Take 1.5 tablets (900 mg total) by mouth 2 (two) times daily., Disp: 90 tablet, Rfl: 6   pantoprazole  (PROTONIX ) 40 MG tablet,  Take 1 tablet (40 mg total) by mouth 2 (two) times daily before a meal. Take 30 minutes before breakfast and dinner, Disp: 60 tablet, Rfl: 1   Testosterone Undecanoate 100 MG CAPS, Take 300 mg by mouth daily at 12 noon., Disp: , Rfl:    traZODone  (DESYREL ) 150 MG tablet, Take 1 tablet (150 mg total) by mouth at bedtime  as needed for sleep., Disp: 30 tablet, Rfl: 0   vortioxetine  HBr (TRINTELLIX ) 10 MG TABS tablet, Take 10 mg by mouth daily., Disp: , Rfl:   PAST MEDICAL HISTORY: 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    PAST SURGICAL HISTORY: Past Surgical History:  Procedure Laterality Date   COLONOSCOPY  11/28/2023   HAND SURGERY Right    TYMPANOSTOMY TUBE PLACEMENT     UPPER GASTROINTESTINAL ENDOSCOPY  11/28/2023   WISDOM TOOTH EXTRACTION      FAMILY HISTORY: Family History  Problem Relation Age of Onset   Anxiety disorder Mother    Interstitial cystitis Mother    COPD Mother    Heart disease Mother    Alcohol abuse Father    COPD Father    Seizures Father    Heart disease Father    Anxiety disorder Maternal Aunt    Anxiety disorder Maternal Grandmother    Diabetes Maternal Grandmother    Heart disease Maternal Grandfather    Diabetes Paternal Grandmother    Alcohol abuse Paternal Grandfather    Heart disease Paternal Grandfather    Autism Son    ADD / ADHD Son    Colon cancer Neg Hx    Esophageal cancer Neg Hx    Rectal cancer Neg Hx    Stomach cancer Neg Hx     SOCIAL HISTORY: Social History   Socioeconomic History   Marital status: Significant Other    Spouse name: elliott   Number of children: 1   Years of education: 12   Highest education level: 12th  grade  Occupational History   Not on file  Tobacco Use   Smoking status: Every Day    Current packs/day: 1.00    Average packs/day: 0.8 packs/day for 31.1 years (23.6 ttl pk-yrs)    Types: Cigarettes    Start date: 07/20/2008   Smokeless tobacco: Former    Types: Snuff    Quit date: 07/21/2023  Vaping Use   Vaping status: Never Used  Substance and Sexual Activity   Alcohol use: Not Currently    Alcohol/week: 28.0 standard drinks of alcohol    Types: 28 Cans of beer per week    Comment: quit Sept 10 2024   Drug use: Not Currently    Types: Cocaine    Comment: last used cocaine 4 months 06/2023 ago, usually one gram used weekly   Sexual activity: Yes  Other Topics Concern   Not on file  Social History Narrative   Right Handed    No Caffeine Use    Social Drivers of Health   Financial Resource Strain: High Risk (05/19/2024)   Received from [facility]   Overall Financial Resource Strain (CARDIA)    Difficulty of Paying Living Expenses: Hard  Food Insecurity: No Food Insecurity (08/17/2024)   Hunger Vital Sign    Worried About Running Out of Food in the Last Year: Never true    Ran Out of Food in the Last Year: Never true  Recent Concern: Food Insecurity - Food Insecurity Present (05/19/2024)   Received  from [facility]   Hunger Vital Sign    Within the past 12 months, you worried that your food would run out before you got the money to buy more.: Often true    Within the past 12 months, the food you bought just didn't last and you didn't have money to get more.: Sometimes true  Transportation Needs: No Transportation Needs (08/17/2024)   PRAPARE - Administrator, Civil Service (Medical): No    Lack of Transportation (Non-Medical): No  Recent Concern: Transportation Needs - Unmet Transportation Needs (05/19/2024)   Received from [facility]   PRAPARE - Transportation    Lack of Transportation (Medical): Yes    Lack of Transportation (Non-Medical): Yes   Physical Activity: Insufficiently Active (05/19/2024)   Received from [facility]   Exercise Vital Sign    On average, how many days per week do you engage in moderate to strenuous exercise (like a brisk walk)?: 5 days    On average, how many minutes do you engage in exercise at this level?: 10 min  Stress: Stress Concern Present (05/19/2024)   Received from [facility] - Occupational Stress Questionnaire    Feeling of Stress : Very much  Social Connections: Moderately Integrated (05/19/2024)   Received from [facility]   Social Network    How would you rate your social network (family, work, friends)?: Adequate participation with social networks  Intimate Partner Violence: Not At Risk (08/17/2024)   Humiliation, Afraid, Rape, and Kick questionnaire    Fear of Current or Ex-Partner: No    Emotionally Abused: No    Physically Abused: No    Sexually Abused: No       PHYSICAL EXAM  There were no vitals filed for this visit.   There is no height or weight on file to calculate BMI.   General: The patient is well-developed and well-nourished and in no acute distress  Mental status: The patient is alert and oriented x 3 at the time of the examination. The patient has apparent normal recent and remote memory, with an apparently normal attention span and concentration ability.   Speech is normal.     DIAGNOSTIC DATA (LABS, IMAGING, TESTING) - I reviewed patient records, labs, notes, testing and imaging myself where available.  Lab Results  Component Value Date   WBC 3.2 (L) 08/17/2024   HGB 12.4 (L) 08/17/2024   HCT 35.8 (L) 08/17/2024   MCV 99.2 08/17/2024   PLT 234 08/17/2024      Component Value Date/Time   NA 136 08/17/2024 1241   NA 139 02/03/2024 1205   K 3.9 08/17/2024 1241   CL 104 08/17/2024 1241   CO2 21 (L) 08/17/2024 1241   GLUCOSE 108 (H) 08/17/2024 1241   BUN 14 08/17/2024 1241   BUN 13 02/03/2024 1205    CREATININE 0.93 08/17/2024 1241   CALCIUM 8.8 (L) 08/17/2024 1241   PROT 6.0 (L) 08/17/2024 1241   PROT 6.1 02/03/2024 1205   ALBUMIN 3.2 (L) 08/17/2024 1241   ALBUMIN 4.3 02/03/2024 1205   AST 57 (H) 08/17/2024 1241   ALT 85 (H) 08/17/2024 1241   ALKPHOS 69 08/17/2024 1241   BILITOT 1.0 08/17/2024 1241   BILITOT <0.2 02/03/2024 1205   GFRNONAA >60 08/17/2024 1241   GFRAA >60 09/05/2020 1200   Lab Results  Component Value Date   CHOL 199 03/28/2023   HDL 64 03/28/2023   LDLCALC 104 (H) 03/28/2023  TRIG 157 (H) 03/28/2023   CHOLHDL 3.1 03/28/2023   Lab Results  Component Value Date   HGBA1C 5.6 03/28/2023   Lab Results  Component Value Date   VITAMINB12 648 06/26/2023   Lab Results  Component Value Date   TSH 1.641 03/28/2023   Amb EEG 05/05/2024 This is a normal 3-day ambulatory EEG tracing. No focal abnormalities or epileptiform discharges were seen. There were no electrographic seizures noted. No events were captured during the recording. Please note a normal EEG does not exclude the diagnosis of epilepsy.    MRI Brain 07/30/2023 Mild generalized cortical atrophy and cerebellar atrophy, unexpected for this age but stable compared to the 09/12/2022 MRI. Brain parenchyma has normal signal.  Structures of the medial temporal lobes appear normal. No acute findings.  Normal enhancement pattern.    ASSESSMENT AND PLAN  Nonepileptic episode (HCC)  Seizure-like activity [facility])  He is a 33 year old man with a history of non epileptic events and polysubstance abuse in remission, last drink September 2024 here for follow up.  Since last visit, he was admitted to the EMU, where 2 of his typical spells were captured and deemed to be nonepileptic.  He was supposed to follow-up with psychiatry but has not done so.  Since patient event are nonepileptic, I will start weaning and decreasing some of his antiseizure medication.  We will start with zonisamide , decrease 100 mg/week and  stop the medication after 3 weeks.  He will remain on Trileptal  900 mg twice daily, clobazam  10 mg nightly but at next visit we will plan to decrease both Trileptal  and clobazam  with the plan of discontinuing clobazam .  My plan is to keep him on low-dose Trileptal  that could also help with his mood and anxiety.  I will see him in 6 to 8 months for follow-up but he understand to contact me sooner if worse.  Again discussed need of behavioral health management.    Patient Instructions  Decrease Zonisamide  by 100 mg weekly for the next 2 weeks then stop the medication  Continue with Trileptal , Gabapentin  and Clobazam   At next visit, we will likely reduce both Trileptal  and Clobazam  with the goal of discontinuing Clobazam   Follow up in 6 to 8 months or sooner if worse     Virtual Visit via Video Note  I connected with  John Vaughan on 09/10/24 at 10:15 AM EDT by a video enabled telemedicine application and verified that I am speaking with the correct person using two identifiers.  Location: Patient: home Provider: [facility] Office   I discussed the limitations of evaluation and management by telemedicine and the availability of in person appointments. The patient expressed understanding and agreed to proceed.   I discussed the assessment and treatment plan with the patient. The patient was provided an opportunity to ask questions and all were answered. The patient agreed with the plan and demonstrated an understanding of the instructions.   The patient was advised to call back or seek an in-person evaluation if the symptoms worsen or if the condition fails to improve as anticipated.  I provided 15 minutes of non-face-to-face time during this encounter.  I have spent a total of 30 minutes dedicated to this patient today, preparing to see patient, performing a medically appropriate examination and evaluation, ordering tests and/or medications and procedures, and counseling and educating the  patient/family/caregiver; independently interpreting result and communicating results to the family/patient/caregiver; and documenting clinical information in the electronic medical record.   Rylea Selway,  MD 09/10/2024, 12:33 PM  [facility] [address], [address] [place], [place] [address] [phone]

## 2024-09-10 NOTE — Telephone Encounter (Signed)
 ..  Pt understands that although there may be some limitations with this type of visit, we will take all precautions to reduce any security or privacy concerns.  Pt understands that this will be treated like an in office visit and we will file with pt's insurance, and there may be a patient responsible charge related to this service. ? ?

## 2024-09-22 ENCOUNTER — Encounter: Payer: Self-pay | Admitting: Neurology

## 2024-10-12 ENCOUNTER — Telehealth: Payer: Self-pay | Admitting: Neurology

## 2024-10-12 ENCOUNTER — Encounter: Payer: Self-pay | Admitting: Neurology

## 2024-10-12 NOTE — Telephone Encounter (Signed)
 Letter sent

## 2024-10-12 NOTE — Telephone Encounter (Signed)
 Pt called requesting that a letter be sent thru  MyChart explaining  why pt can t work due to his Seizures . PT states he need proof that he has seizures and could that be placed in letter form so he can let Job know . Pt requested to be contacted thru MyChart

## 2024-10-12 NOTE — Telephone Encounter (Signed)
Sent patient a mychart.

## 2024-11-17 ENCOUNTER — Ambulatory Visit (INDEPENDENT_AMBULATORY_CARE_PROVIDER_SITE_OTHER): Payer: MEDICAID | Admitting: Gastroenterology

## 2024-11-17 ENCOUNTER — Telehealth: Payer: Self-pay

## 2024-11-17 ENCOUNTER — Other Ambulatory Visit (INDEPENDENT_AMBULATORY_CARE_PROVIDER_SITE_OTHER): Payer: MEDICAID

## 2024-11-17 ENCOUNTER — Encounter: Payer: Self-pay | Admitting: Gastroenterology

## 2024-11-17 VITALS — BP 100/62 | HR 84 | Ht 71.0 in | Wt 198.5 lb

## 2024-11-17 DIAGNOSIS — K648 Other hemorrhoids: Secondary | ICD-10-CM

## 2024-11-17 DIAGNOSIS — R1010 Upper abdominal pain, unspecified: Secondary | ICD-10-CM | POA: Diagnosis not present

## 2024-11-17 DIAGNOSIS — K921 Melena: Secondary | ICD-10-CM

## 2024-11-17 DIAGNOSIS — R197 Diarrhea, unspecified: Secondary | ICD-10-CM | POA: Diagnosis not present

## 2024-11-17 DIAGNOSIS — R112 Nausea with vomiting, unspecified: Secondary | ICD-10-CM

## 2024-11-17 DIAGNOSIS — K8020 Calculus of gallbladder without cholecystitis without obstruction: Secondary | ICD-10-CM | POA: Diagnosis not present

## 2024-11-17 DIAGNOSIS — K625 Hemorrhage of anus and rectum: Secondary | ICD-10-CM

## 2024-11-17 DIAGNOSIS — R634 Abnormal weight loss: Secondary | ICD-10-CM

## 2024-11-17 DIAGNOSIS — R748 Abnormal levels of other serum enzymes: Secondary | ICD-10-CM | POA: Diagnosis not present

## 2024-11-17 DIAGNOSIS — K219 Gastro-esophageal reflux disease without esophagitis: Secondary | ICD-10-CM | POA: Diagnosis not present

## 2024-11-17 LAB — COMPREHENSIVE METABOLIC PANEL WITH GFR
ALT: 160 U/L — ABNORMAL HIGH (ref 0–53)
AST: 129 U/L — ABNORMAL HIGH (ref 0–37)
Albumin: 3.8 g/dL (ref 3.5–5.2)
Alkaline Phosphatase: 76 U/L (ref 39–117)
BUN: 12 mg/dL (ref 6–23)
CO2: 28 meq/L (ref 19–32)
Calcium: 9 mg/dL (ref 8.4–10.5)
Chloride: 105 meq/L (ref 96–112)
Creatinine, Ser: 1.08 mg/dL (ref 0.40–1.50)
GFR: 91.64 mL/min (ref >60.00)
Glucose, Bld: 100 mg/dL — ABNORMAL HIGH (ref 70–99)
Potassium: 3.8 meq/L (ref 3.5–5.1)
Sodium: 140 meq/L (ref 135–145)
Total Bilirubin: 0.7 mg/dL (ref 0.2–1.2)
Total Protein: 6.4 g/dL (ref 6.0–8.3)

## 2024-11-17 LAB — CBC WITH DIFFERENTIAL/PLATELET
Basophils Absolute: 0 K/uL (ref 0.0–0.1)
Basophils Relative: 1.1 % (ref 0.0–3.0)
Eosinophils Absolute: 0.2 K/uL (ref 0.0–0.7)
Eosinophils Relative: 4.9 % (ref 0.0–5.0)
HCT: 39.4 % (ref 39.0–52.0)
Hemoglobin: 13.5 g/dL (ref 13.0–17.0)
Lymphocytes Relative: 26.9 % (ref 12.0–46.0)
Lymphs Abs: 1 K/uL (ref 0.7–4.0)
MCHC: 34.3 g/dL (ref 30.0–36.0)
MCV: 101.4 fl — ABNORMAL HIGH (ref 78.0–100.0)
Monocytes Absolute: 0.4 K/uL (ref 0.1–1.0)
Monocytes Relative: 9.8 % (ref 3.0–12.0)
Neutro Abs: 2.2 K/uL (ref 1.4–7.7)
Neutrophils Relative %: 57.3 % (ref 43.0–77.0)
Platelets: 184 K/uL (ref 150.0–400.0)
RBC: 3.89 Mil/uL — ABNORMAL LOW (ref 4.22–5.81)
RDW: 14 % (ref 11.5–15.5)
WBC: 3.8 K/uL — ABNORMAL LOW (ref 4.0–10.5)

## 2024-11-17 LAB — VITAMIN B12: Vitamin B-12: 273 pg/mL (ref 211–911)

## 2024-11-17 LAB — FOLATE: Folate: 14.2 ng/mL (ref >5.9)

## 2024-11-17 LAB — LIPASE: Lipase: 34 U/L (ref 11.0–59.0)

## 2024-11-17 LAB — TSH: TSH: 2.38 u[IU]/mL (ref 0.35–5.50)

## 2024-11-17 MED ORDER — ESOMEPRAZOLE MAGNESIUM 40 MG PO CPDR: 40.0000 mg | DELAYED_RELEASE_CAPSULE | Freq: Two times a day (BID) | ORAL | 2 refills | Status: AC

## 2024-11-17 MED ORDER — HYDROCORTISONE ACETATE 25 MG RE SUPP: 25.0000 mg | Freq: Every day | RECTAL | 0 refills | Status: DC

## 2024-11-17 NOTE — Telephone Encounter (Signed)
 Pre-operative Risk Assessment     Request for surgical clearance:     Endoscopy Procedure  What type of surgery is being performed?     EGD  When is this surgery scheduled?     12/04/2024  What type of clearance is required ?   neurology  Practice name and name of physician performing surgery?      [facility], Dr Gustav Mcgee  What is your office phone and fax number?      Phone- [phone]  Fax- [phone], [phone]  Anesthesia type (None, local, MAC, general) ?       MAC   Please route your response to Wilma Wuthrich, CMA (AAMA)

## 2024-11-17 NOTE — Telephone Encounter (Signed)
 Neurology clearance letter sent thru epic and also faxed to Dr Pastor Falling.

## 2024-11-17 NOTE — Patient Instructions (Signed)
 Your provider has requested that you go to the basement level for lab work before leaving today. Press B on the elevator. The lab is located at the first door on the left as you exit the elevator.  Due to recent changes in healthcare laws, you may see the results of your imaging and laboratory studies on MyChart before your provider has had a chance to review them.  We understand that in some cases there may be results that are confusing or concerning to you. Not all laboratory results come back in the same time frame and the provider may be waiting for multiple results in order to interpret others.  Please give us  48 hours in order for your provider to thoroughly review all the results before contacting the office for clarification of your results.   You have been scheduled for an appointment with _____________ at [facility]. Your appointment is on ______________ at _______________. Please arrive at ______________ for registration. Make certain to bring a list of current medications, including any over the counter medications or vitamins. Also bring your co-pay if you have one as well as your insurance cards. [facility] is located at [address], [address]. Should you need to reschedule your appointment, please contact them at [phone]. They will contact you about making this appointment.   You have been scheduled for an endoscopy. Please follow written instructions given to you at your visit today.  If you use inhalers (even only as needed), please bring them with you on the day of your procedure.  If you take any of the following medications, they will need to be adjusted prior to your procedure:   DO NOT TAKE 7 DAYS PRIOR TO TEST- Trulicity (dulaglutide) Ozempic, Wegovy (semaglutide) Mounjaro, Zepbound (tirzepatide) Bydureon Bcise (exanatide extended release)  DO NOT TAKE 1 DAY PRIOR TO YOUR TEST Rybelsus (semaglutide) Adlyxin (lixisenatide) Victoza  (liraglutide) Byetta (exanatide) ___________________________________________________________________________  We will get neurology clearance for your EGD.   Stop the Protonix  and  start esomeprazole  twice daily. Take 30 minutes prior to food.  This has been sent to your pharmacy.  We have sent the following medications to your pharmacy for you to pick up at your convenience: Anusol  suppositories  I appreciate the opportunity to care for you. Camie Furbish, PA

## 2024-11-17 NOTE — Progress Notes (Unsigned)
 John Vaughan [id number] 11/25/1991   Chief Complaint:  Referring Provider: Mavis Redge SAILOR, FNP Primary GI MD: Dr. Shila  HPI: John Vaughan is a 33 y.o. 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    Last seen in office 10/14/2023 by John Shawl, NP for abdominal pain, nausea and vomiting, intermittent black emesis and melena.  Endorse chronic loose stools and intermittently bloody stools with lower abdominal pain as well.  Had elevated LFTs, likely due to alcohol use disorder.  He was scheduled for EGD and colonoscopy at that time, started on PPI twice daily.  CBC without evidence of anemia, LFTs were normal.  Underwent EGD with findings of LA grade a reflux esophagitis without bleeding, 2 cm hiatal hernia, and a benign-appearing esophageal stenosis which was dilated.  Colon was normal aside from internal hemorrhoids and recommended recall at age 69.  Patient is referred by PCP for vomiting, diarrhea, dark stools, and RUQ abdominal pain.  Admitted 08/17/2024 to 08/21/2024 for seizure.  Diagnosed with nonepileptic spells and recommendation for cognitive behavioral therapy.  Advised to have cardiology follow-up due to bradycardia.  Had a visit with neurology 09/10/2024 for follow-up of seizure-like activity.  Has nonepileptic events.  Per that note, last drink was September 2024.  Per patient message, did have seizure-like activity in September, but per neurology needs to follow-up with psychiatry for this.   Drinking any alcohol? no Drug use? no Smoking? current Symptom onset? Taking PPI?     Discussed the use of AI scribe software for clinical note transcription with the patient, who gave verbal consent to proceed.  History  of Present Illness John Vaughan is a 33 year old male who presents with persistent vomiting and dark stools.  Gastrointestinal bleeding and stool changes - Persistent dark stools for the past year, described as black and sometimes yellow and loose - Blood visible in stool every other time - Black stools occur consistently, not only when taking Pepto Bismol - No iron supplement use - Previous colonoscopy showed internal hemorrhoids  Vomiting and nausea - Persistent vomiting for the past year - Vomiting consists of undigested food from the previous night - Nausea associated with abdominal pain and pain radiating to the back, particularly on the right side - Uses Pepto Bismol when unable to keep food down  Abdominal pain - Persistent upper abdominal pain not relieved by bowel movements - Pain radiates to the back, especially on the right side - Pain associated with nausea and vomiting  Altered bowel habits - Loose stools with yellow and black appearance for the past year - No improvement in stool consistency despite treatment  Gastroesophageal reflux and heartburn - Acid reflux and heartburn while taking pantoprazole  (Protonix ) twice daily - Reflux symptoms occur randomly, regardless of food type  Gallstones - History of gallstones identified on ultrasound in 2023  Hepatic abnormalities - History of elevated liver enzymes - Fatty liver identified on CT scan last year  Substance use history - History of heavy alcohol consumption starting at age 65, up to 32 beers per day - Quit alcohol last year - Current tobacco use - No illicit drug use  Cardiac findings - Low heart rate observed during seizure study - Referred to cardiology but has not yet been evaluated  Previous GI Procedures/Imaging   EGD 11/28/2023 - Benign-appearing esophageal stenosis. Dilated.  - LA Grade A reflux esophagitis with no bleeding.  - 2 cm hiatal hernia.  - Normal stomach.  -  Normal examined duodenum.  - Biopsies were taken with a cold forceps for evaluation of eosinophilic esophagitis.  Colonoscopy 11/28/2023 - The examined portion of the ileum was normal.  - Normal mucosa in the entire examined colon. Biopsied.  - Non- bleeding internal hemorrhoids.  - The examination was otherwise normal. - Recall age 85  Path: 1. Surgical [P], distal esophagus :      - ESOPHAGEAL SQUAMOUS MUCOSA WITH NO SPECIFIC HISTOPATHOLOGIC CHANGES      - NEGATIVE FOR INCREASED INTRAEPITHELIAL EOSINOPHILS       2. Surgical [P], proximal esophagus :      - ESOPHAGEAL SQUAMOUS MUCOSA WITH NO SPECIFIC HISTOPATHOLOGIC CHANGES      - NEGATIVE FOR INCREASED INTRAEPITHELIAL EOSINOPHILS       3. Surgical [P], random colon :      - COLONIC MUCOSA WITH NO SPECIFIC HISTOPATHOLOGIC CHANGES      - NEGATIVE FOR ACUTE INFLAMMATION, INCREASED INTRAEPITHELIAL LYMPHOCYTES OR      THICKENED SUBEPITHELIAL COLLAGEN TABLE  CT A/P 06/24/2023 IMPRESSION: Negative CT abdomen/pelvis. Normal appendix.  CT A/P 03/25/2023 IMPRESSION: 1. No acute findings within the abdomen or pelvis. 2. Decreased liver attenuation consistent with hepatic steatosis. No other abnormality.  RUQ US  08/25/2022 IMPRESSION: Small gallbladder stones. There are no signs of acute cholecystitis. No other sonographic abnormalities are seen in right upper quadrant.  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    Past Surgical History:  Procedure Laterality Date   COLONOSCOPY  11/28/2023   HAND SURGERY Right    TYMPANOSTOMY TUBE PLACEMENT     UPPER GASTROINTESTINAL ENDOSCOPY  11/28/2023   WISDOM TOOTH EXTRACTION      Current  Outpatient Medications  Medication Sig Dispense Refill   ARIPiprazole  (ABILIFY ) 5 MG tablet Take 1 tablet (5 mg total) by mouth daily. 30 tablet 0   busPIRone  (BUSPAR ) 10 MG tablet Take 10 mg by mouth daily.     cloBAZam  (ONFI ) 10 MG tablet Take 1 tablet (10 mg total) by mouth at bedtime. 30 tablet 5   gabapentin  (NEURONTIN ) 600 MG tablet Take 1 tablet (600 mg total) by mouth 2 (two) times daily.     hydrOXYzine  (ATARAX ) 50 MG tablet Take 50 mg by mouth as needed for anxiety, itching, nausea or vomiting.     nicotine  (NICODERM CQ  - DOSED IN MG/24 HOURS) 21 mg/24hr patch Place 21 mg onto the skin daily.     ondansetron  (ZOFRAN -ODT) 8 MG disintegrating tablet Take 8 mg by mouth every 8 (eight) hours as needed for nausea, vomiting or refractory nausea / vomiting.     oxcarbazepine  (TRILEPTAL ) 600 MG tablet Take 1.5 tablets (900 mg total) by mouth 2 (two) times daily. 90 tablet 6   pantoprazole  (PROTONIX ) 40 MG tablet Take 1 tablet (40 mg total) by mouth 2 (two) times daily before a meal. Take 30 minutes before  breakfast and dinner 60 tablet 1   Testosterone Undecanoate 100 MG CAPS Take 300 mg by mouth daily at 12 noon.     traZODone  (DESYREL ) 150 MG tablet Take 1 tablet (150 mg total) by mouth at bedtime as needed for sleep. 30 tablet 0   vortioxetine  HBr (TRINTELLIX ) 10 MG TABS tablet Take 10 mg by mouth daily.     No current facility-administered medications for this visit.    Allergies as of 11/17/2024   (No Active Allergies)    Family History  Problem Relation Age of Onset   Anxiety disorder Mother    Interstitial cystitis Mother    COPD Mother    Heart disease Mother    Alcohol abuse Father    COPD Father    Seizures Father    Heart disease Father    Anxiety disorder Maternal Aunt    Anxiety disorder Maternal Grandmother    Diabetes Maternal Grandmother    Heart disease Maternal Grandfather    Diabetes Paternal Grandmother    Alcohol abuse Paternal Grandfather    Heart disease  Paternal Grandfather    Autism Son    ADD / ADHD Son    Colon cancer Neg Hx    Esophageal cancer Neg Hx    Rectal cancer Neg Hx    Stomach cancer Neg Hx     Social History   Tobacco Use   Smoking status: Every Day    Current packs/day: 1.00    Average packs/day: 0.8 packs/day for 31.3 years (23.8 ttl pk-yrs)    Types: Cigarettes    Start date: 07/20/2008   Smokeless tobacco: Former    Types: Snuff    Quit date: 07/21/2023  Vaping Use   Vaping status: Never Used  Substance Use Topics   Alcohol use: Not Currently    Alcohol/week: 28.0 standard drinks of alcohol    Types: 28 Cans of beer per week    Comment: quit Sept 10 2024   Drug use: Not Currently    Types: Cocaine    Comment: last used cocaine 4 months 06/2023 ago, usually one gram used weekly     Review of Systems:    Constitutional: No weight loss, fever, chills, weakness or fatigue Eyes: No change in vision Ears, Nose, Throat:  No change in hearing or congestion Skin: No rash or itching Cardiovascular: No chest pain, chest pressure or palpitations   Respiratory: No SOB or cough Gastrointestinal: See HPI and otherwise negative Genitourinary: No dysuria or change in urinary frequency Neurological: No headache, dizziness or syncope Musculoskeletal: No new muscle or joint pain Hematologic: No bleeding or bruising    Physical Exam:  Vital signs: BP 100/62   Pulse 84   Ht 5' 11 (1.803 m)   Wt 198 lb 8 oz (90 kg)   BMI 27.69 kg/m   Wt Readings from Last 3 Encounters:  11/17/24 198 lb 8 oz (90 kg)  08/18/24 206 lb (93.4 kg)  02/13/24 230 lb (104.3 kg)     Constitutional: NAD, Well developed, Well nourished, alert and cooperative Head:  Normocephalic and atraumatic.  Eyes: No scleral icterus. Conjunctiva pink. Mouth: No oral lesions. Respiratory: Respirations even and unlabored. Lungs clear to auscultation bilaterally.  No wheezes, crackles, or rhonchi.  Cardiovascular:  Regular rate and rhythm. No murmurs.  No peripheral edema. Gastrointestinal:  Soft, nondistended, nontender. No rebound or guarding. Normal bowel sounds. No appreciable masses or hepatomegaly. Rectal:  Not performed.  Neurologic:  Alert and oriented x4;  grossly normal neurologically.  Skin:   Dry and intact without significant lesions or rashes. Psychiatric: Oriented to person, place and time. Demonstrates good judgement and reason without abnormal affect or behaviors.   RELEVANT LABS AND IMAGING: CBC    Component Value Date/Time   WBC 3.2 (L) 08/17/2024 1241   RBC 3.61 (L) 08/17/2024 1241   HGB 12.4 (L) 08/17/2024 1241   HCT 35.8 (L) 08/17/2024 1241   PLT 234 08/17/2024 1241   MCV 99.2 08/17/2024 1241   MCH 34.3 (H) 08/17/2024 1241   MCHC 34.6 08/17/2024 1241   RDW 13.7 08/17/2024 1241   LYMPHSABS 1.1 08/17/2024 1241   MONOABS 0.4 08/17/2024 1241   EOSABS 0.1 08/17/2024 1241   BASOSABS 0.0 08/17/2024 1241    CMP     Component Value Date/Time   NA 136 08/17/2024 1241   NA 139 02/03/2024 1205   K 3.9 08/17/2024 1241   CL 104 08/17/2024 1241   CO2 21 (L) 08/17/2024 1241   GLUCOSE 108 (H) 08/17/2024 1241   BUN 14 08/17/2024 1241   BUN 13 02/03/2024 1205   CREATININE 0.93 08/17/2024 1241   CALCIUM 8.8 (L) 08/17/2024 1241   PROT 6.0 (L) 08/17/2024 1241   PROT 6.1 02/03/2024 1205   ALBUMIN 3.2 (L) 08/17/2024 1241   ALBUMIN 4.3 02/03/2024 1205   AST 57 (H) 08/17/2024 1241   ALT 85 (H) 08/17/2024 1241   ALKPHOS 69 08/17/2024 1241   BILITOT 1.0 08/17/2024 1241   BILITOT <0.2 02/03/2024 1205   GFRNONAA >60 08/17/2024 1241   GFRAA >60 09/05/2020 1200     Assessment/Plan:    Assessment & Plan Chronic abdominal pain with vomiting and altered bowel habits Persistent symptoms for two years with differential including gastroparesis and gallbladder issues. Gastroparesis suspected due to delayed gastric emptying. Gallbladder stones with possible biliary colic considered. - Ordered gastric emptying study. -  Referred to surgery for gallbladder evaluation. - Ordered blood work for anemia and other causes. - Performed rectal exam for hemorrhoids.  Gastroesophageal reflux disease with persistent symptoms and non-bleeding esophagitis Persistent symptoms despite maximum pantoprazole . Esophagitis noted on endoscopy. Risk of esophageal damage if uncontrolled. - Switched reflux medication. - Referred to neurology for endoscopy clearance. - Consider repeat endoscopy for esophagitis evaluation.  Gallbladder stones with possible biliary colic Gallbladder stones on ultrasound. Symptoms suggestive of biliary colic. - Referred to surgery for gallbladder evaluation and possible cholecystectomy.  Internal hemorrhoids with intermittent rectal bleeding Intermittent rectal bleeding with previous colonoscopy showing internal hemorrhoids. - Performed rectal exam for active hemorrhoids. - Consider treatment if active.  Fatty liver (hepatic steatosis) with history of elevated liver enzymes Fatty liver on imaging with elevated liver enzymes. Differential includes hepatitis and gallbladder issues. - Ordered blood work for liver enzymes and other causes.  Anemia, unspecified Concern for anemia due to dark stools and previous hemoglobin dip. Potential causes include GI bleeding and nutritional deficiencies. - Ordered blood work for hemoglobin, iron, B12, and folic acid . - Tested for blood in stool.  Psychogenic non-epileptic seizures Seizure activity diagnosed as psychogenic non-epileptic seizures. Clearance needed for endoscopy. - Obtain neurology clearance for endoscopy.  History of alcohol use disorder, now abstinent Abstinent from alcohol since last year. Previous heavy use may contribute to liver issues. - Continue monitoring liver function and alcohol history.   Nausea and vomiting Melena Rectal bleeding Chronic diarrhea Abdominal pain Hepatic steatosis Elevated liver enzymes Unintentional weight  loss  CBC, CMP, lipase, TSH, TTG, IgA, iron/ferritin, folic acid , B12  Further workup of LFTs if remaining elevated (has previously been found to have hepatic steatosis on imaging) EGD with neurology clearance Referral to surgery due to gallstones, abd pain, N/V, diarrhea Switch PPI to esomeprazole  40 mg BID Consider gastric emptying study If hemoccult positive, IDA, etc and nothing on EGD to explain melena consider capsule study Anusol  suppositories  Diffuse TTP but worse in epigastrium and LUQ Internal hems on anoscopy   Camie Furbish, PA-C  Gastroenterology 11/17/2024, 8:26 AM  Patient Care Team: Mavis Redge SAILOR, FNP as PCP - General (Nurse Practitioner)

## 2024-11-18 ENCOUNTER — Encounter: Payer: Self-pay | Admitting: Gastroenterology

## 2024-11-18 ENCOUNTER — Ambulatory Visit: Payer: Self-pay | Admitting: Gastroenterology

## 2024-11-18 ENCOUNTER — Encounter: Payer: Self-pay | Admitting: Neurology

## 2024-11-18 DIAGNOSIS — R748 Abnormal levels of other serum enzymes: Secondary | ICD-10-CM

## 2024-11-18 LAB — IGA: Immunoglobulin A: 431 mg/dL — ABNORMAL HIGH (ref 47–310)

## 2024-11-18 LAB — TISSUE TRANSGLUTAMINASE, IGA: (tTG) Ab, IgA: <1 U/mL

## 2024-11-18 NOTE — Telephone Encounter (Signed)
 Clearance letter routed to you. Thank you

## 2024-11-18 NOTE — Telephone Encounter (Signed)
 This message will be routed to Dr Shila and also Camie Furbish PA to review letter.

## 2024-11-23 ENCOUNTER — Emergency Department (HOSPITAL_COMMUNITY): Payer: MEDICAID

## 2024-11-23 ENCOUNTER — Observation Stay (HOSPITAL_COMMUNITY)
Admission: EM | Admit: 2024-11-23 | Discharge: 2024-11-25 | Disposition: A | Payer: MEDICAID | Source: Ambulatory Visit | Attending: General Surgery | Admitting: General Surgery

## 2024-11-23 ENCOUNTER — Encounter (HOSPITAL_COMMUNITY): Payer: Self-pay

## 2024-11-23 ENCOUNTER — Other Ambulatory Visit: Payer: Self-pay

## 2024-11-23 DIAGNOSIS — R7401 Elevation of levels of liver transaminase levels: Secondary | ICD-10-CM | POA: Insufficient documentation

## 2024-11-23 DIAGNOSIS — K8012 Calculus of gallbladder with acute and chronic cholecystitis without obstruction: Principal | ICD-10-CM | POA: Insufficient documentation

## 2024-11-23 DIAGNOSIS — K8000 Calculus of gallbladder with acute cholecystitis without obstruction: Secondary | ICD-10-CM | POA: Diagnosis present

## 2024-11-23 DIAGNOSIS — F319 Bipolar disorder, unspecified: Secondary | ICD-10-CM | POA: Insufficient documentation

## 2024-11-23 DIAGNOSIS — F17210 Nicotine dependence, cigarettes, uncomplicated: Secondary | ICD-10-CM | POA: Insufficient documentation

## 2024-11-23 DIAGNOSIS — I10 Essential (primary) hypertension: Secondary | ICD-10-CM | POA: Insufficient documentation

## 2024-11-23 DIAGNOSIS — K8050 Calculus of bile duct without cholangitis or cholecystitis without obstruction: Principal | ICD-10-CM

## 2024-11-23 LAB — URINALYSIS, ROUTINE W REFLEX MICROSCOPIC
Bilirubin Urine: NEGATIVE
Glucose, UA: NEGATIVE mg/dL
Hgb urine dipstick: NEGATIVE
Ketones, ur: NEGATIVE mg/dL
Leukocytes,Ua: NEGATIVE
Nitrite: NEGATIVE
Protein, ur: NEGATIVE mg/dL
Specific Gravity, Urine: 1.023 (ref 1.005–1.030)
pH: 5 (ref 5.0–8.0)

## 2024-11-23 LAB — COMPREHENSIVE METABOLIC PANEL WITH GFR
ALT: 78 U/L — ABNORMAL HIGH (ref 0–44)
AST: 71 U/L — ABNORMAL HIGH (ref 15–41)
Albumin: 4.1 g/dL (ref 3.5–5.0)
Alkaline Phosphatase: 71 U/L (ref 38–126)
Anion gap: 10 (ref 5–15)
BUN: 7 mg/dL (ref 6–20)
CO2: 25 mmol/L (ref 22–32)
Calcium: 9.4 mg/dL (ref 8.9–10.3)
Chloride: 103 mmol/L (ref 98–111)
Creatinine, Ser: 0.99 mg/dL (ref 0.61–1.24)
GFR, Estimated: >60 mL/min (ref >60)
Glucose, Bld: 103 mg/dL — ABNORMAL HIGH (ref 70–99)
Potassium: 4 mmol/L (ref 3.5–5.1)
Sodium: 137 mmol/L (ref 135–145)
Total Bilirubin: 0.8 mg/dL (ref 0.0–1.2)
Total Protein: 7.1 g/dL (ref 6.5–8.1)

## 2024-11-23 LAB — CBC
HCT: 40.5 % (ref 39.0–52.0)
Hemoglobin: 14 g/dL (ref 13.0–17.0)
MCH: 34.9 pg — ABNORMAL HIGH (ref 26.0–34.0)
MCHC: 34.6 g/dL (ref 30.0–36.0)
MCV: 101 fL — ABNORMAL HIGH (ref 80.0–100.0)
Platelets: 228 K/uL (ref 150–400)
RBC: 4.01 MIL/uL — ABNORMAL LOW (ref 4.22–5.81)
RDW: 13.1 % (ref 11.5–15.5)
WBC: 4 K/uL (ref 4.0–10.5)
nRBC: 0 % (ref 0.0–0.2)

## 2024-11-23 LAB — LIPASE, BLOOD: Lipase: 36 U/L (ref 11–51)

## 2024-11-23 MED ORDER — SENNA 8.6 MG PO TABS
1.0000 | ORAL_TABLET | Freq: Two times a day (BID) | ORAL | Status: DC
  Administered 2024-11-23 – 2024-11-25 (×4): 8.6 mg via ORAL
  Filled 2024-11-23 (×4): qty 1

## 2024-11-23 MED ORDER — POLYETHYLENE GLYCOL 3350 17 G PO PACK: 17.0000 g | PACK | Freq: Every day | ORAL | Status: DC | PRN

## 2024-11-23 MED ORDER — ONDANSETRON HCL 4 MG/2ML IJ SOLN
4.0000 mg | Freq: Once | INTRAMUSCULAR | Status: AC
  Administered 2024-11-23: 4 mg via INTRAVENOUS
  Filled 2024-11-23: qty 2

## 2024-11-23 MED ORDER — HYDROXYZINE HCL 25 MG PO TABS
50.0000 mg | ORAL_TABLET | ORAL | Status: DC | PRN
  Administered 2024-11-23: 50 mg via ORAL
  Filled 2024-11-23 (×3): qty 2

## 2024-11-23 MED ORDER — PROCHLORPERAZINE MALEATE 10 MG PO TABS: 10.0000 mg | ORAL_TABLET | Freq: Four times a day (QID) | ORAL | Status: DC | PRN

## 2024-11-23 MED ORDER — OXCARBAZEPINE 300 MG PO TABS
900.0000 mg | ORAL_TABLET | Freq: Two times a day (BID) | ORAL | Status: DC
  Administered 2024-11-23 – 2024-11-25 (×4): 900 mg via ORAL
  Filled 2024-11-23 (×4): qty 3

## 2024-11-23 MED ORDER — ARIPIPRAZOLE 5 MG PO TABS
5.0000 mg | ORAL_TABLET | Freq: Every day | ORAL | Status: DC
  Administered 2024-11-24 – 2024-11-25 (×2): 5 mg via ORAL
  Filled 2024-11-23 (×3): qty 1

## 2024-11-23 MED ORDER — MUPIROCIN 2 % EX OINT
1.0000 | TOPICAL_OINTMENT | Freq: Two times a day (BID) | CUTANEOUS | Status: DC
  Administered 2024-11-23 – 2024-11-25 (×3): 1 via NASAL
  Filled 2024-11-23: qty 22

## 2024-11-23 MED ORDER — CLOBAZAM 10 MG PO TABS
10.0000 mg | ORAL_TABLET | Freq: Every day | ORAL | Status: DC
  Administered 2024-11-24 (×2): 10 mg via ORAL
  Filled 2024-11-23 (×2): qty 1

## 2024-11-23 MED ORDER — ONDANSETRON HCL 4 MG/2ML IJ SOLN: 4.0000 mg | Freq: Four times a day (QID) | INTRAMUSCULAR | Status: DC | PRN

## 2024-11-23 MED ORDER — ENSURE PLUS HIGH PROTEIN PO LIQD
237.0000 mL | Freq: Two times a day (BID) | ORAL | Status: DC
  Administered 2024-11-24 – 2024-11-25 (×2): 237 mL via ORAL

## 2024-11-23 MED ORDER — MORPHINE SULFATE (PF) 4 MG/ML IV SOLN
4.0000 mg | Freq: Once | INTRAVENOUS | Status: AC
  Administered 2024-11-23: 4 mg via INTRAVENOUS
  Filled 2024-11-23: qty 1

## 2024-11-23 MED ORDER — INFLUENZA VIRUS VACC SPLIT PF (FLUZONE) 0.5 ML IM SUSY: 0.5000 mL | PREFILLED_SYRINGE | INTRAMUSCULAR | Status: DC | PRN

## 2024-11-23 MED ORDER — KCL IN DEXTROSE-NACL 20-5-0.45 MEQ/L-%-% IV SOLN
INTRAVENOUS | Status: AC
  Filled 2024-11-23 (×2): qty 1000

## 2024-11-23 MED ORDER — GABAPENTIN 300 MG PO CAPS
600.0000 mg | ORAL_CAPSULE | Freq: Two times a day (BID) | ORAL | Status: DC
  Administered 2024-11-23 – 2024-11-25 (×4): 600 mg via ORAL
  Filled 2024-11-23 (×2): qty 6
  Filled 2024-11-23: qty 2
  Filled 2024-11-23: qty 6

## 2024-11-23 MED ORDER — METOPROLOL TARTRATE 5 MG/5ML IV SOLN: 5.0000 mg | Freq: Four times a day (QID) | INTRAVENOUS | Status: DC | PRN

## 2024-11-23 MED ORDER — ENOXAPARIN SODIUM 40 MG/0.4ML IJ SOSY
40.0000 mg | PREFILLED_SYRINGE | INTRAMUSCULAR | Status: DC
  Administered 2024-11-23 – 2024-11-24 (×2): 40 mg via SUBCUTANEOUS
  Filled 2024-11-23 (×2): qty 0.4

## 2024-11-23 MED ORDER — KETOROLAC TROMETHAMINE 15 MG/ML IJ SOLN
15.0000 mg | Freq: Once | INTRAMUSCULAR | Status: AC
  Administered 2024-11-23: 15 mg via INTRAVENOUS
  Filled 2024-11-23: qty 1

## 2024-11-23 MED ORDER — SIMETHICONE 80 MG PO CHEW
40.0000 mg | CHEWABLE_TABLET | Freq: Four times a day (QID) | ORAL | Status: DC | PRN
  Administered 2024-11-24 – 2024-11-25 (×2): 40 mg via ORAL
  Filled 2024-11-23 (×2): qty 1

## 2024-11-23 MED ORDER — PANTOPRAZOLE SODIUM 40 MG PO TBEC
40.0000 mg | DELAYED_RELEASE_TABLET | Freq: Every day | ORAL | Status: DC
  Administered 2024-11-23 – 2024-11-25 (×3): 40 mg via ORAL
  Filled 2024-11-23 (×3): qty 1

## 2024-11-23 MED ORDER — IOHEXOL 300 MG/ML  SOLN
100.0000 mL | Freq: Once | INTRAMUSCULAR | Status: AC | PRN
  Administered 2024-11-23: 100 mL via INTRAVENOUS

## 2024-11-23 MED ORDER — HYDROMORPHONE HCL 1 MG/ML IJ SOLN
0.5000 mg | INTRAMUSCULAR | Status: DC | PRN
  Administered 2024-11-23 – 2024-11-24 (×3): 1 mg via INTRAVENOUS
  Filled 2024-11-23 (×3): qty 1

## 2024-11-23 MED ORDER — NICOTINE 21 MG/24HR TD PT24
21.0000 mg | MEDICATED_PATCH | Freq: Every day | TRANSDERMAL | Status: DC
  Administered 2024-11-23: 21 mg via TRANSDERMAL
  Filled 2024-11-23 (×3): qty 1

## 2024-11-23 MED ORDER — OXYCODONE-ACETAMINOPHEN 5-325 MG PO TABS
1.0000 | ORAL_TABLET | Freq: Once | ORAL | Status: AC
  Administered 2024-11-23: 1 via ORAL
  Filled 2024-11-23: qty 1

## 2024-11-23 MED ORDER — BUSPIRONE HCL 5 MG PO TABS
10.0000 mg | ORAL_TABLET | Freq: Every day | ORAL | Status: DC
  Filled 2024-11-23: qty 1
  Filled 2024-11-23 (×2): qty 2

## 2024-11-23 MED ORDER — ACETAMINOPHEN 500 MG PO TABS
1000.0000 mg | ORAL_TABLET | Freq: Four times a day (QID) | ORAL | Status: DC
  Administered 2024-11-23 – 2024-11-25 (×6): 1000 mg via ORAL
  Filled 2024-11-23 (×7): qty 2

## 2024-11-23 MED ORDER — TRAMADOL HCL 50 MG PO TABS
50.0000 mg | ORAL_TABLET | Freq: Four times a day (QID) | ORAL | Status: DC | PRN
  Filled 2024-11-23: qty 1

## 2024-11-23 MED ORDER — ONDANSETRON 4 MG PO TBDP
4.0000 mg | ORAL_TABLET | Freq: Four times a day (QID) | ORAL | Status: DC | PRN
  Filled 2024-11-23: qty 1

## 2024-11-23 MED ORDER — PROCHLORPERAZINE EDISYLATE 10 MG/2ML IJ SOLN: 5.0000 mg | Freq: Four times a day (QID) | INTRAMUSCULAR | Status: DC | PRN

## 2024-11-23 MED ORDER — SODIUM CHLORIDE 0.9 % IV SOLN
2.0000 g | INTRAVENOUS | Status: DC
  Administered 2024-11-23 – 2024-11-24 (×2): 2 g via INTRAVENOUS
  Filled 2024-11-23 (×2): qty 20

## 2024-11-23 MED ORDER — HYDROCORTISONE ACETATE 25 MG RE SUPP: 25.0000 mg | Freq: Every evening | RECTAL | Status: DC | PRN

## 2024-11-23 MED ORDER — TRAZODONE HCL 50 MG PO TABS: 150.0000 mg | ORAL_TABLET | Freq: Every evening | ORAL | Status: DC | PRN

## 2024-11-23 MED ORDER — OXYCODONE HCL 5 MG PO TABS
5.0000 mg | ORAL_TABLET | ORAL | Status: DC | PRN
  Administered 2024-11-23 – 2024-11-25 (×4): 10 mg via ORAL
  Filled 2024-11-23 (×4): qty 2

## 2024-11-23 NOTE — ED Provider Notes (Signed)
 [place] EMERGENCY DEPARTMENT AT [facility] Provider Note   CSN: [id number] Arrival date & time: 11/23/24  1322     Patient presents with: Abdominal Pain, Nausea, and Emesis   Abdulwahab Demelo is a 33 y.o. male.   This is a 33 year old male who is here today with abdominal pain.  Patient been following with the GI clinic, had blood work drawn which showed increases in his LFTs.  Patient has known gallstones.  Has been eating white rice and drinking chicken broth.  Today, he was not able to eat or drink.   Abdominal Pain Associated symptoms: vomiting   Emesis Associated symptoms: abdominal pain        Prior to Admission medications   Medication Sig Start Date End Date Taking? Authorizing Provider  ARIPiprazole  (ABILIFY ) 5 MG tablet Take 1 tablet (5 mg total) by mouth daily. 07/27/23 02/03/36  Massengill, Rankin, MD  busPIRone  (BUSPAR ) 10 MG tablet Take 10 mg by mouth daily. 10/01/23   [provider]  cloBAZam  (ONFI ) 10 MG tablet Take 1 tablet (10 mg total) by mouth at bedtime. 07/09/24   Camara, Amadou, MD  esomeprazole  (NEXIUM ) 40 MG capsule Take 1 capsule (40 mg total) by mouth 2 (two) times daily before a meal. 11/17/24   Heinz, Camie BRAVO, PA-C  gabapentin  (NEURONTIN ) 600 MG tablet Take 1 tablet (600 mg total) by mouth 2 (two) times daily. 08/21/24   Yadav, Priyanka O, MD  hydrocortisone  (ANUSOL -HC) 25 MG suppository Place 1 suppository (25 mg total) rectally at bedtime. 11/17/24   Heinz, Camie BRAVO, PA-C  hydrOXYzine  (ATARAX ) 50 MG tablet Take 50 mg by mouth as needed for anxiety, itching, nausea or vomiting. 10/01/23   [provider]  nicotine  (NICODERM CQ  - DOSED IN MG/24 HOURS) 21 mg/24hr patch Place 21 mg onto the skin daily. 11/27/23   [provider]  ondansetron  (ZOFRAN -ODT) 8 MG disintegrating tablet Take 8 mg by mouth every 8 (eight) hours as needed for nausea, vomiting or refractory nausea / vomiting. 08/14/23   [provider]   oxcarbazepine  (TRILEPTAL ) 600 MG tablet Take 1.5 tablets (900 mg total) by mouth 2 (two) times daily. 05/21/24 12/17/24  Camara, Amadou, MD  Testosterone Undecanoate 100 MG CAPS Take 300 mg by mouth daily at 12 noon.    [provider]  traZODone  (DESYREL ) 150 MG tablet Take 1 tablet (150 mg total) by mouth at bedtime as needed for sleep. 07/26/23 02/02/25  Massengill, Rankin, MD  vortioxetine  HBr (TRINTELLIX ) 10 MG TABS tablet Take 10 mg by mouth daily.    [provider]    Allergies: Patient has no known allergies.    Review of Systems  Gastrointestinal:  Positive for abdominal pain and vomiting.    Updated Vital Signs BP (!) 128/94 (BP Location: Right Arm)   Pulse (!) 54   Temp 98.3 F (36.8 C) (Oral)   Resp 18   SpO2 100%   Physical Exam Vitals and nursing note reviewed.  Constitutional:      Appearance: He is not toxic-appearing.  HENT:     Head: Normocephalic.  Pulmonary:     Effort: Pulmonary effort is normal.  Abdominal:     General: Abdomen is flat.     Palpations: Abdomen is soft.     Tenderness: There is generalized abdominal tenderness and tenderness in the epigastric area.  Neurological:     General: No focal deficit present.     Mental Status: He is alert.     (  all labs ordered are listed, but only abnormal results are displayed) Labs Reviewed  CBC - Abnormal; Notable for the following components:      Result Value   RBC 4.01 (*)    MCV 101.0 (*)    MCH 34.9 (*)    All other components within normal limits  URINALYSIS, ROUTINE W REFLEX MICROSCOPIC - Abnormal; Notable for the following components:   APPearance HAZY (*)    All other components within normal limits  LIPASE, BLOOD  COMPREHENSIVE METABOLIC PANEL WITH GFR    EKG: None  Radiology: No results found.   Procedures   Medications Ordered in the ED  morphine  (PF) 4 MG/ML injection 4 mg (4 mg Intravenous Given 11/23/24 1415)  ondansetron  (ZOFRAN ) injection 4 mg (4 mg  Intravenous Given 11/23/24 1416)                                    Medical Decision Making 33 year old male here today with abdominal pain.  Differential diagnoses include biliary colic, cholecystitis, enteritis, gastroenteritis.  Plan -reviewed the patient's gastroenterology notes, as well as blood work done.  Does have an elevation in his LFTs.  Will check repeat blood work, obtain a right upper quadrant ultrasound on the patient.  Given his symptoms, do have concerns for symptomatic biliary colic, patient has been unable to eat.  He does not have any infectious symptoms at this time.  Reviewed the patient's medications.  Patient was signed out to Dr. Neysa pending labs and right upper quadrant ultrasound.  Amount and/or Complexity of Data Reviewed Labs: ordered. Radiology: ordered.  Risk Prescription drug management.        Final diagnoses:  Biliary colic    ED Discharge Orders     None          Sami, Froh, DO 11/23/24 1450

## 2024-11-23 NOTE — Plan of Care (Signed)
Discuss and review plan of care with patient/family  

## 2024-11-23 NOTE — ED Provider Notes (Signed)
 Clinical Course as of 11/23/24 1918  Mon Nov 23, 2024  1504 Received signout; Symptomatic biliary colic. Pending US  read and CT abd. 3 weeks of symptoms but worsening and near constant for the past few days.  [TY]  1552 US  Abdomen Limited RUQ (LIVER/GB) IMPRESSION: Cholecystolithiasis. No changes of acute Cholecystitis.   [TY]  1615 CT ABDOMEN PELVIS W CONTRAST IMPRESSION: 1. No acute intra-abdominal or pelvic abnormality. 2. A couple of punctate radiopaque gallstones present. No changes of acute cholecystitis.     [TY]  1625 Patient continues to have pain.  Repeat dose of morphine given.  Ultrasound with stones, but no infection.  No CBD dilation.  CT abdomen also without acute abnormalities.  Will consult surgery for symptomatic biliary colic for possible cholecystectomy. [TY]  [id number] Spoke with Dr. Rubin, recommending trial of Toradol and Percocet to see if we can get pain under control for discharge with outpatient surgical planning.  If pain not controlled, repage for admission [TY]  1806 Spoke with Dr. Aron as patient reexamined and notes his pain is still 8 out of 10 after Toradol and Percocets. She will assess for admission.  [TY]    Clinical Course User Index [TY] Neysa Caron PARAS, DO      Neysa Caron PARAS, [place] 11/23/24 [id number]

## 2024-11-23 NOTE — H&P (Signed)
 John Vaughan is an 33 y.o. male.   Chief Complaint: abdominal pain HPI:  Patient comes the emergency department with approximately 24 to 36 hours of worsening right upper quadrant abdominal pain.  It has not gone away during this entire time.  He has had similar pain like this before and has had it resolved spontaneously.  This time however it did not.  His mother has a history of gallbladder disease.  He has had an increase in his LFTs and has known gallstones.  Today when his LFTs were elevated he was told to come to the emergency department.  He denies fevers, chills, jaundice.  He has not had any abdominal surgery in the past.  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    Past Surgical History:  Procedure Laterality Date   COLONOSCOPY  11/28/2023   HAND SURGERY Right    TYMPANOSTOMY TUBE PLACEMENT     UPPER GASTROINTESTINAL ENDOSCOPY  11/28/2023   WISDOM TOOTH EXTRACTION      Family History  Problem Relation Age of Onset   Anxiety disorder Mother    Interstitial cystitis Mother    COPD Mother    Heart disease Mother    Alcohol abuse Father    COPD Father    Seizures Father    Heart disease Father    Anxiety disorder Maternal Aunt    Anxiety disorder Maternal Grandmother    Diabetes Maternal Grandmother    Heart disease Maternal Grandfather    Diabetes Paternal Grandmother    Alcohol abuse Paternal Grandfather    Heart disease Paternal Grandfather    Autism Son    ADD / ADHD Son    Colon cancer Neg Hx    Esophageal cancer Neg Hx    Rectal cancer Neg Hx    Stomach cancer Neg Hx    Social History:  reports that he has been smoking cigarettes. He started smoking  about 16 years ago. He has a 23.8 pack-year smoking history. He quit smokeless tobacco use about 16 months ago.  His smokeless tobacco use included snuff. He reports that he does not currently use alcohol after a past usage of about 28.0 standard drinks of alcohol per week. He reports that he does not currently use drugs after having used the following drugs: Cocaine.  Allergies: No Known Allergies  Meds:   ARIPiprazole  (ABILIFY ) 5 MG tablet busPIRone  (BUSPAR ) 10 MG tablet cloBAZam  (ONFI ) 10 MG tablet esomeprazole  (NEXIUM ) 40 MG capsule gabapentin  (NEURONTIN ) 600 MG tablet hydrocortisone  (ANUSOL -HC) 25 MG suppository hydrOXYzine  (ATARAX ) 50 MG tablet nicotine  (NICODERM CQ  - DOSED IN MG/24 HOURS) 21 mg/24hr patch ondansetron  (ZOFRAN -ODT) 8 MG disintegrating tablet oxcarbazepine  (TRILEPTAL ) 600 MG tablet Testosterone Undecanoate 100 MG CAPS traZODone  (DESYREL ) 150 MG tablet vortioxetine  HBr (TRINTELLIX ) 10 MG TABS tablet  Results for orders placed or performed during the hospital encounter of 11/23/24 (from the past 48 hours)  Lipase, blood     Status: None   Collection Time: 11/23/24  1:47 PM  Result Value Ref Range   Lipase 36 11 - 51 U/L  Comment: Performed at [facility], [address], [place], [place] [address]  Comprehensive metabolic panel     Status: Abnormal   Collection Time: 11/23/24  1:47 PM  Result Value Ref Range   Sodium 137 135 - 145 mmol/L   Potassium 4.0 3.5 - 5.1 mmol/L   Chloride 103 98 - 111 mmol/L   CO2 25 22 - 32 mmol/L   Glucose, Bld 103 (H) 70 - 99 mg/dL    Comment: Glucose reference range applies only to samples taken after fasting for at least 8 hours.   BUN 7 6 - 20 mg/dL   Creatinine, Ser 9.00 0.61 - 1.24 mg/dL   Calcium 9.4 8.9 - 89.6 mg/dL   Total Protein 7.1 6.5 - 8.1 g/dL   Albumin 4.1 3.5 - 5.0 g/dL   AST 71 (H) 15 - 41 U/L   ALT 78 (H) 0 - 44 U/L   Alkaline Phosphatase 71 38 - 126 U/L   Total Bilirubin 0.8 0.0 - 1.2  mg/dL   GFR, Estimated >39 >39 mL/min    Comment: (NOTE) Calculated using the CKD-EPI Creatinine Equation (2021)    Anion gap 10 5 - 15    Comment: Performed at [facility], [address] [place], [place] [address]  CBC     Status: Abnormal   Collection Time: 11/23/24  1:47 PM  Result Value Ref Range   WBC 4.0 4.0 - 10.5 K/uL   RBC 4.01 (L) 4.22 - 5.81 MIL/uL   Hemoglobin 14.0 13.0 - 17.0 g/dL   HCT 59.4 60.9 - 47.9 %   MCV 101.0 (H) 80.0 - 100.0 fL   MCH 34.9 (H) 26.0 - 34.0 pg   MCHC 34.6 30.0 - 36.0 g/dL   RDW 86.8 88.4 - 84.4 %   Platelets 228 150 - 400 K/uL   nRBC 0.0 0.0 - 0.2 %    Comment: Performed at [facility], [address] [place], [place] [address]  Urinalysis, Routine w reflex microscopic -Urine, Clean Catch     Status: Abnormal   Collection Time: 11/23/24  2:24 PM  Result Value Ref Range   Color, Urine YELLOW YELLOW   APPearance HAZY (A) CLEAR   Specific Gravity, Urine 1.023 1.005 - 1.030   pH 5.0 5.0 - 8.0   Glucose, UA NEGATIVE NEGATIVE mg/dL   Hgb urine dipstick NEGATIVE NEGATIVE   Bilirubin Urine NEGATIVE NEGATIVE   Ketones, ur NEGATIVE NEGATIVE mg/dL   Protein, ur NEGATIVE NEGATIVE mg/dL   Nitrite NEGATIVE NEGATIVE   Leukocytes,Ua NEGATIVE NEGATIVE    Comment: Performed at [facility], [address] [place], [place] [address]   CT ABDOMEN PELVIS W CONTRAST Result Date: 11/23/2024 CLINICAL DATA:  Abdominal pain, acute, nonlocalized EXAM: CT ABDOMEN AND PELVIS WITH CONTRAST TECHNIQUE: Multidetector CT imaging of the abdomen and pelvis was performed using the standard protocol following bolus administration of intravenous contrast. RADIATION DOSE REDUCTION: This exam was performed according to the departmental dose-optimization program which includes automated exposure control, adjustment of the mA and/or kV according to patient size and/or use of iterative reconstruction technique. CONTRAST:   100mL OMNIPAQUE  IOHEXOL  300 MG/ML  SOLN COMPARISON:  None available. FINDINGS: Lower chest: No focal airspace consolidation or pleural effusion. Hepatobiliary: No mass.A couple of punctate radiopaque gallstones. No wall thickening. No intrahepatic or extrahepatic biliary ductal dilation. Pancreas: No mass or main ductal dilation. No peripancreatic inflammation or fluid collection. Spleen: Normal size. No mass. Adrenals/Urinary  Tract: No adrenal masses. No renal mass. No nephrolithiasis or hydronephrosis. The urinary bladder is distended without focal abnormality. Stomach/Bowel: The stomach is decompressed without focal abnormality. No small bowel wall thickening or inflammation. No small bowel obstruction.Normal appendix. Vascular/Lymphatic: No aortic aneurysm. No intraabdominal or pelvic lymphadenopathy. Reproductive: No prostatomegaly.No free pelvic fluid. Other: No pneumoperitoneum, ascites, or mesenteric inflammation. Musculoskeletal: No acute fracture or destructive lesion. IMPRESSION: 1. No acute intra-abdominal or pelvic abnormality. 2. A couple of punctate radiopaque gallstones present. No changes of acute cholecystitis. Electronically Signed   By: Rogelia Myers M.D.   On: 11/23/2024 16:01   US  Abdomen Limited RUQ (LIVER/GB) Result Date: 11/23/2024 CLINICAL DATA:  [id number] RUQ pain 151471 EXAM: ULTRASOUND ABDOMEN LIMITED RIGHT UPPER QUADRANT COMPARISON:  June 24, 2023 FINDINGS: Gallbladder: Small shadowing gallstones. No wall thickening or pericholecystic fluid. No sonographic Murphy's sign noted by sonographer. Common bile duct: Diameter: 2.7 mm Liver: Normal echogenicity. No focal lesion identified. No intrahepatic biliary ductal dilation. Portal vein is patent on color Doppler imaging with normal direction of blood flow towards the liver. Right Kidney: Partially visualized. No mass. No hydronephrosis or nephrolithiasis. Other: None. IMPRESSION: Cholecystolithiasis. No changes of acute cholecystitis.  Electronically Signed   By: Rogelia Myers M.D.   On: 11/23/2024 15:42    Review of Systems  Constitutional: Negative.   HENT: Negative.    Eyes: Negative.   Respiratory: Negative.    Cardiovascular: Negative.   Gastrointestinal:  Positive for abdominal pain and nausea.  Endocrine: Negative.   Genitourinary: Negative.   Musculoskeletal: Negative.   Skin: Negative.   Allergic/Immunologic: Negative.   Neurological: Negative.   Hematological: Negative.   All other systems reviewed and are negative.   Blood pressure 110/68, pulse (!) 51, temperature 98.1 F (36.7 C), temperature source Oral, resp. rate 18, SpO2 100%. Physical Exam Vitals reviewed.  Constitutional:      General: He is in acute distress (looks uncomfortable).     Appearance: He is well-developed.  HENT:     Head: Normocephalic and atraumatic.     Mouth/Throat:     Mouth: Mucous membranes are moist.  Cardiovascular:     Rate and Rhythm: Normal rate and regular rhythm.     Heart sounds: Normal heart sounds. No murmur heard.    No friction rub. No gallop.  Pulmonary:     Effort: Pulmonary effort is normal. No respiratory distress.     Breath sounds: Normal breath sounds. No stridor. No wheezing or rhonchi.  Abdominal:     General: Abdomen is flat. Bowel sounds are decreased. There is no distension or abdominal bruit. There are no signs of injury.     Palpations: Abdomen is soft. There is no shifting dullness, fluid wave, hepatomegaly or splenomegaly.     Tenderness: There is abdominal tenderness in the right upper quadrant.     Hernia: No hernia is present.  Skin:    General: Skin is warm and dry.     Capillary Refill: Capillary refill takes 2 to 3 seconds.     Coloration: Skin is not cyanotic, jaundiced, mottled or pale.     Findings: No erythema or rash.  Neurological:     General: No focal deficit present.     Mental Status: He is alert and oriented to person, place, and time.     Cranial Nerves: No  cranial nerve deficit.     Motor: No weakness.  Psychiatric:        Mood and Affect: Mood normal.  Mood is not anxious or depressed.        Behavior: Behavior normal.      Assessment/Plan Acute calculous cholecystitis Increased LFTs.  Bipolar discorder  Admit to the floor.  Will allow low-fat diet as tolerated before midnight. N.p.o. after midnight Pain control, nausea control Laparoscopic cholecystectomy with possible cholangiogram tentatively planned for tomorrow barring unforeseen circumstances  The surgical procedure was described to the patient in detail.  The patient was given educational material.  I discussed the incision type and location, the location of the gallbladder, the anatomy of the bile ducts and arteries, and the typical progression of surgery.  I discussed the possibility of converting to an open operation.  I advised of the risks of bleeding, infection, damage to other structures (such as the bile duct, intestine or liver), bile leak, need for other procedures or surgeries, and post op diarrhea/constipation.  We discussed the risk of blood clot.  We discussed the recovery period and post operative restrictions.      Jina LITTIE Nephew, MD, FACS, FSSO Surgical Oncology, General Surgery, Trauma and Critical [facility] Surgery, [place] [phone] for weekday/non holidays Check [email or web address] for coverage night/weekend/holidays under General Surgery

## 2024-11-23 NOTE — ED Triage Notes (Addendum)
 Pt ambulatory to triage at the request of GI clinic. Pt was informed of abnormal labs. Pt reports vomiting bile, yellow liquid stool, and abdominal pain. PT has gallstones, and was sent for gallbladder attack.

## 2024-11-24 ENCOUNTER — Other Ambulatory Visit (HOSPITAL_COMMUNITY): Payer: Self-pay

## 2024-11-24 ENCOUNTER — Encounter (HOSPITAL_COMMUNITY): Admission: EM | Disposition: A | Discharge status: Home / Self Care | Payer: Self-pay | Source: Ambulatory Visit

## 2024-11-24 ENCOUNTER — Observation Stay (HOSPITAL_COMMUNITY): Payer: MEDICAID | Admitting: Anesthesiology

## 2024-11-24 ENCOUNTER — Encounter (HOSPITAL_COMMUNITY): Payer: Self-pay

## 2024-11-24 HISTORY — PX: CHOLECYSTECTOMY: SHX55

## 2024-11-24 LAB — COMPREHENSIVE METABOLIC PANEL WITH GFR
ALT: 59 U/L — ABNORMAL HIGH (ref 0–44)
AST: 43 U/L — ABNORMAL HIGH (ref 15–41)
Albumin: 3.6 g/dL (ref 3.5–5.0)
Alkaline Phosphatase: 62 U/L (ref 38–126)
Anion gap: 7 (ref 5–15)
BUN: 7 mg/dL (ref 6–20)
CO2: 24 mmol/L (ref 22–32)
Calcium: 8.8 mg/dL — ABNORMAL LOW (ref 8.9–10.3)
Chloride: 108 mmol/L (ref 98–111)
Creatinine, Ser: 1.05 mg/dL (ref 0.61–1.24)
GFR, Estimated: >60 mL/min (ref >60)
Glucose, Bld: 97 mg/dL (ref 70–99)
Potassium: 4.3 mmol/L (ref 3.5–5.1)
Sodium: 139 mmol/L (ref 135–145)
Total Bilirubin: 0.6 mg/dL (ref 0.0–1.2)
Total Protein: 6 g/dL — ABNORMAL LOW (ref 6.5–8.1)

## 2024-11-24 LAB — SURGICAL PCR SCREEN
MRSA, PCR: NEGATIVE
Staphylococcus aureus: POSITIVE — AB

## 2024-11-24 SURGERY — LAPAROSCOPIC CHOLECYSTECTOMY
Anesthesia: General

## 2024-11-24 MED ORDER — DEXMEDETOMIDINE HCL IN NACL 80 MCG/20ML IV SOLN
INTRAVENOUS | Status: AC
  Filled 2024-11-24: qty 20

## 2024-11-24 MED ORDER — LIDOCAINE HCL (PF) 2 % IJ SOLN
INTRAMUSCULAR | Status: AC
  Filled 2024-11-24: qty 5

## 2024-11-24 MED ORDER — HYDROMORPHONE HCL 1 MG/ML IJ SOLN
0.2500 mg | INTRAMUSCULAR | Status: DC | PRN
  Administered 2024-11-24 (×2): 0.25 mg via INTRAVENOUS
  Administered 2024-11-24: 0.5 mg via INTRAVENOUS

## 2024-11-24 MED ORDER — HYDROMORPHONE HCL 1 MG/ML IJ SOLN
INTRAMUSCULAR | Status: AC
  Filled 2024-11-24: qty 1

## 2024-11-24 MED ORDER — MIDAZOLAM HCL (PF) 2 MG/2ML IJ SOLN
INTRAMUSCULAR | Status: DC | PRN
  Administered 2024-11-24: 2 mg via INTRAVENOUS

## 2024-11-24 MED ORDER — SUCCINYLCHOLINE CHLORIDE 200 MG/10ML IV SOSY
PREFILLED_SYRINGE | INTRAVENOUS | Status: DC | PRN
  Administered 2024-11-24: 160 mg via INTRAVENOUS

## 2024-11-24 MED ORDER — POLYETHYLENE GLYCOL 3350 17 G PO PACK: 17.0000 g | PACK | Freq: Every day | ORAL | 0 refills | Status: AC | PRN

## 2024-11-24 MED ORDER — KETOROLAC TROMETHAMINE 30 MG/ML IJ SOLN
INTRAMUSCULAR | Status: DC | PRN
  Administered 2024-11-24: 30 mg via INTRAVENOUS

## 2024-11-24 MED ORDER — SUGAMMADEX SODIUM 200 MG/2ML IV SOLN
INTRAVENOUS | Status: DC | PRN
  Administered 2024-11-24: 200 mg via INTRAVENOUS

## 2024-11-24 MED ORDER — LACTATED RINGERS IV SOLN: INTRAVENOUS | Status: DC

## 2024-11-24 MED ORDER — OXYCODONE HCL 5 MG PO TABS: 5.0000 mg | ORAL_TABLET | Freq: Once | ORAL | Status: DC | PRN

## 2024-11-24 MED ORDER — SUGAMMADEX SODIUM 200 MG/2ML IV SOLN
INTRAVENOUS | Status: AC
  Filled 2024-11-24: qty 2

## 2024-11-24 MED ORDER — ONDANSETRON HCL 4 MG/2ML IJ SOLN: 4.0000 mg | Freq: Once | INTRAMUSCULAR | Status: DC | PRN

## 2024-11-24 MED ORDER — GLYCOPYRROLATE 0.2 MG/ML IJ SOLN
INTRAMUSCULAR | Status: DC | PRN
  Administered 2024-11-24: .1 mg via INTRAVENOUS

## 2024-11-24 MED ORDER — 0.9 % SODIUM CHLORIDE (POUR BTL) OPTIME
TOPICAL | Status: DC | PRN
  Administered 2024-11-24: 1000 mL

## 2024-11-24 MED ORDER — ROCURONIUM BROMIDE 100 MG/10ML IV SOLN
INTRAVENOUS | Status: DC | PRN
  Administered 2024-11-24: 40 mg via INTRAVENOUS
  Administered 2024-11-24: 15 mg via INTRAVENOUS

## 2024-11-24 MED ORDER — OXYCODONE HCL 5 MG/5ML PO SOLN: 5.0000 mg | Freq: Once | ORAL | Status: DC | PRN

## 2024-11-24 MED ORDER — DEXAMETHASONE SOD PHOSPHATE PF 10 MG/ML IJ SOLN
INTRAMUSCULAR | Status: DC | PRN
  Administered 2024-11-24: 5 mg via INTRAVENOUS

## 2024-11-24 MED ORDER — ONDANSETRON HCL 4 MG/2ML IJ SOLN
INTRAMUSCULAR | Status: AC
  Filled 2024-11-24: qty 2

## 2024-11-24 MED ORDER — ACETAMINOPHEN 500 MG PO TABS
1000.0000 mg | ORAL_TABLET | Freq: Four times a day (QID) | ORAL | 0 refills | Status: AC
  Filled 2024-11-24: qty 40, 5d supply, fill #0

## 2024-11-24 MED ORDER — CHLORHEXIDINE GLUCONATE 0.12 % MT SOLN
15.0000 mL | Freq: Once | OROMUCOSAL | Status: AC
  Administered 2024-11-24: 15 mL via OROMUCOSAL

## 2024-11-24 MED ORDER — MIDAZOLAM HCL 2 MG/2ML IJ SOLN
INTRAMUSCULAR | Status: AC
  Filled 2024-11-24: qty 2

## 2024-11-24 MED ORDER — MEPERIDINE HCL 25 MG/ML IJ SOLN: 6.2500 mg | INTRAMUSCULAR | Status: DC | PRN

## 2024-11-24 MED ORDER — KETAMINE HCL 50 MG/5ML IJ SOSY
PREFILLED_SYRINGE | INTRAMUSCULAR | Status: AC
  Filled 2024-11-24: qty 5

## 2024-11-24 MED ORDER — SPY AGENT GREEN - (INDOCYANINE FOR INJECTION)
1.2500 mg | Freq: Once | INTRAMUSCULAR | Status: AC
  Administered 2024-11-24: 1.25 mg via INTRAVENOUS

## 2024-11-24 MED ORDER — GLYCOPYRROLATE 0.2 MG/ML IJ SOLN
INTRAMUSCULAR | Status: AC
  Filled 2024-11-24: qty 1

## 2024-11-24 MED ORDER — LIDOCAINE HCL (PF) 2 % IJ SOLN
INTRAMUSCULAR | Status: DC | PRN
  Administered 2024-11-24: 60 mg via INTRADERMAL

## 2024-11-24 MED ORDER — ONDANSETRON HCL 4 MG/2ML IJ SOLN
INTRAMUSCULAR | Status: DC | PRN
  Administered 2024-11-24: 4 mg via INTRAVENOUS

## 2024-11-24 MED ORDER — KETOROLAC TROMETHAMINE 30 MG/ML IJ SOLN: 30.0000 mg | Freq: Once | INTRAMUSCULAR | Status: DC | PRN

## 2024-11-24 MED ORDER — METHOCARBAMOL 500 MG PO TABS
750.0000 mg | ORAL_TABLET | Freq: Four times a day (QID) | ORAL | 0 refills | Status: AC | PRN
  Filled 2024-11-24: qty 30, 5d supply, fill #0

## 2024-11-24 MED ORDER — KETAMINE HCL 50 MG/5ML IJ SOSY
PREFILLED_SYRINGE | INTRAMUSCULAR | Status: DC | PRN
  Administered 2024-11-24: 30 mg via INTRAVENOUS

## 2024-11-24 MED ORDER — BUPIVACAINE-EPINEPHRINE (PF) 0.25% -1:200000 IJ SOLN
INTRAMUSCULAR | Status: AC
  Filled 2024-11-24: qty 30

## 2024-11-24 MED ORDER — KETOROLAC TROMETHAMINE 30 MG/ML IJ SOLN
INTRAMUSCULAR | Status: AC
  Filled 2024-11-24: qty 1

## 2024-11-24 MED ORDER — DEXMEDETOMIDINE HCL IN NACL 80 MCG/20ML IV SOLN
INTRAVENOUS | Status: DC | PRN
  Administered 2024-11-24: 10 ug via INTRAVENOUS

## 2024-11-24 MED ORDER — ROCURONIUM BROMIDE 10 MG/ML (PF) SYRINGE
PREFILLED_SYRINGE | INTRAVENOUS | Status: AC
  Filled 2024-11-24: qty 10

## 2024-11-24 MED ORDER — AMISULPRIDE (ANTIEMETIC) 5 MG/2ML IV SOLN: 10.0000 mg | Freq: Once | INTRAVENOUS | Status: DC | PRN

## 2024-11-24 MED ORDER — SUCCINYLCHOLINE CHLORIDE 200 MG/10ML IV SOSY
PREFILLED_SYRINGE | INTRAVENOUS | Status: AC
  Filled 2024-11-24: qty 10

## 2024-11-24 MED ORDER — FENTANYL CITRATE (PF) 250 MCG/5ML IJ SOLN
INTRAMUSCULAR | Status: DC | PRN
  Administered 2024-11-24 (×2): 75 ug via INTRAVENOUS
  Administered 2024-11-24: 50 ug via INTRAVENOUS

## 2024-11-24 MED ORDER — PROPOFOL 10 MG/ML IV BOLUS
INTRAVENOUS | Status: AC
  Filled 2024-11-24: qty 20

## 2024-11-24 MED ORDER — OXYCODONE HCL 5 MG PO TABS
5.0000 mg | ORAL_TABLET | Freq: Four times a day (QID) | ORAL | 0 refills | Status: DC | PRN
  Filled 2024-11-24: qty 15, 4d supply, fill #0

## 2024-11-24 MED ORDER — FENTANYL CITRATE (PF) 250 MCG/5ML IJ SOLN
INTRAMUSCULAR | Status: AC
  Filled 2024-11-24: qty 5

## 2024-11-24 MED ORDER — BUPIVACAINE-EPINEPHRINE (PF) 0.25% -1:200000 IJ SOLN
INTRAMUSCULAR | Status: DC | PRN
  Administered 2024-11-24: 15 mL

## 2024-11-24 MED ORDER — IBUPROFEN 400 MG PO TABS
600.0000 mg | ORAL_TABLET | Freq: Three times a day (TID) | ORAL | Status: DC
  Administered 2024-11-24 – 2024-11-25 (×3): 600 mg via ORAL
  Filled 2024-11-24 (×3): qty 1

## 2024-11-24 MED ORDER — PROPOFOL 10 MG/ML IV BOLUS
INTRAVENOUS | Status: DC | PRN
  Administered 2024-11-24: 200 mg via INTRAVENOUS

## 2024-11-24 SURGICAL SUPPLY — 31 items
BAG COUNTER SPONGE SURGICOUNT (BAG) IMPLANT
CHLORAPREP W/TINT 26 (MISCELLANEOUS) ×1 IMPLANT
CLIP APPLIE 5 13 M/L LIGAMAX5 (MISCELLANEOUS) IMPLANT
CLIP LIGATING HEMO O LOK GREEN (MISCELLANEOUS) ×1 IMPLANT
COVER MAYO STAND XLG (MISCELLANEOUS) IMPLANT
COVER TRANSDUCER ULTRASND (DRAPES) ×1 IMPLANT
DERMABOND ADVANCED .7 DNX12 (GAUZE/BANDAGES/DRESSINGS) ×1 IMPLANT
DRAPE C-ARM 42X120 X-RAY (DRAPES) IMPLANT
ELECT REM PT RETURN 15FT ADLT (MISCELLANEOUS) ×1 IMPLANT
GAUZE SPONGE 2X2 8PLY STRL LF (GAUZE/BANDAGES/DRESSINGS) ×1 IMPLANT
GLOVE BIO SURGEON STRL SZ7.5 (GLOVE) ×1 IMPLANT
GOWN STRL REUS W/ TWL XL LVL3 (GOWN DISPOSABLE) ×1 IMPLANT
GRASPER SUT TROCAR 14GX15 (MISCELLANEOUS) IMPLANT
IRRIGATION SUCT STRKRFLW 2 WTP (MISCELLANEOUS) ×1 IMPLANT
KIT BASIN OR (CUSTOM PROCEDURE TRAY) ×1 IMPLANT
KIT IMAGING PINPOINTPAQ (MISCELLANEOUS) IMPLANT
KIT TURNOVER KIT A (KITS) ×1 IMPLANT
NDL INSUFFLATION 14GA 120MM (NEEDLE) ×1 IMPLANT
PENCIL SMOKE EVACUATOR (MISCELLANEOUS) IMPLANT
POUCH RETRIEVAL ECOSAC 10 (ENDOMECHANICALS) IMPLANT
SCISSORS LAP 5X35 DISP (ENDOMECHANICALS) ×1 IMPLANT
SET CHOLANGIOGRAPH MIX (MISCELLANEOUS) IMPLANT
SET TUBE SMOKE EVAC HIGH FLOW (TUBING) ×1 IMPLANT
SLEEVE Z-THREAD 5X100MM (TROCAR) ×1 IMPLANT
SPIKE FLUID TRANSFER (MISCELLANEOUS) ×1 IMPLANT
SUT MNCRL AB 4-0 PS2 18 (SUTURE) ×1 IMPLANT
SUT VICRYL 0 UR6 27IN ABS (SUTURE) ×1 IMPLANT
TOWEL OR DSP ST BLU DLX 10/PK (DISPOSABLE) ×1 IMPLANT
TRAY LAPAROSCOPIC (CUSTOM PROCEDURE TRAY) ×1 IMPLANT
TROCAR 11X100 Z THREAD (TROCAR) ×1 IMPLANT
TROCAR Z-THREAD OPTICAL 5X100M (TROCAR) ×1 IMPLANT

## 2024-11-24 NOTE — Transfer of Care (Signed)
 Immediate Anesthesia Transfer of Care Note  Patient: John Vaughan  Procedure(s) Performed: LAPAROSCOPIC CHOLECYSTECTOMY  Patient Location: PACU  Anesthesia Type:General  Level of Consciousness: drowsy and patient cooperative  Airway & Oxygen Therapy: Patient Spontanous Breathing and Patient connected to face mask oxygen  Post-op Assessment: Report given to RN and Post -op Vital signs reviewed and stable  Post vital signs: Reviewed and stable  Last Vitals:  Vitals Value Taken Time  BP 137/91 11/24/24 09:37  Temp    Pulse 62 11/24/24 09:39  Resp 10 11/24/24 09:39  SpO2 99 % 11/24/24 09:39  Vitals shown include unfiled device data.  Last Pain:  Vitals:   11/24/24 0810  TempSrc:   PainSc: 0-No pain      Patients Stated Pain Goal: 2 (11/23/24 2341)  Complications: No notable events documented.

## 2024-11-24 NOTE — Progress Notes (Signed)
 Discharge medications delivered to patient at the bedside.

## 2024-11-24 NOTE — Anesthesia Preprocedure Evaluation (Addendum)
 Anesthesia Evaluation  Patient identified by MRN, date of birth, ID band Patient awake    Reviewed: Allergy & Precautions, H&P , NPO status , Patient's Chart, lab work & pertinent test results  Airway Mallampati: II  TM Distance: >3 FB Neck ROM: Full    Dental no notable dental hx. (+) Teeth Intact, Dental Advisory Given, Poor Dentition   Pulmonary sleep apnea , Current Smoker and Patient abstained from smoking.   Pulmonary exam normal breath sounds clear to auscultation       Cardiovascular Exercise Tolerance: Good hypertension, Pt. on medications Normal cardiovascular exam Rhythm:Regular Rate:Normal     Neuro/Psych Seizures -, Well Controlled,  PSYCHIATRIC DISORDERS Anxiety Depression Bipolar Disorder Schizophrenia  negative neurological ROS  negative psych ROS   GI/Hepatic negative GI ROS, Neg liver ROS,GERD  ,,  Endo/Other  negative endocrine ROS    Renal/GU negative Renal ROS  negative genitourinary   Musculoskeletal negative musculoskeletal ROS (+)    Abdominal   Peds negative pediatric ROS (+)  Hematology negative hematology ROS (+)   Anesthesia Other Findings   Reproductive/Obstetrics negative OB ROS                              Anesthesia Physical Anesthesia Plan  ASA: 3 and emergent  Anesthesia Plan: General   Post-op Pain Management: Ofirmev  IV (intra-op)*, Minimal or no pain anticipated and Precedex    Induction: Intravenous and Cricoid pressure planned  PONV Risk Score and Plan: 1 and Ondansetron , Dexamethasone and Midazolam   Airway Management Planned: Oral ETT  Additional Equipment: None  Intra-op Plan:   Post-operative Plan: Extubation in OR  Informed Consent: I have reviewed the patients History and Physical, chart, labs and discussed the procedure including the risks, benefits and alternatives for the proposed anesthesia with the patient or authorized  representative who has indicated his/her understanding and acceptance.       Plan Discussed with: Anesthesiologist and CRNA  Anesthesia Plan Comments: (  )         Anesthesia Quick Evaluation

## 2024-11-24 NOTE — Anesthesia Procedure Notes (Signed)
 Procedure Name: Intubation Date/Time: 11/24/2024 8:55 AM  Performed by: Augusta Daved SAILOR, CRNAPre-anesthesia Checklist: Patient identified, Emergency Drugs available, Suction available and Patient being monitored Patient Re-evaluated:Patient Re-evaluated prior to induction Oxygen Delivery Method: Circle System Utilized Preoxygenation: Pre-oxygenation with 100% oxygen Induction Type: IV induction Laryngoscope Size: Miller and 2 Grade View: Grade I Tube type: Oral Tube size: 7.5 mm Number of attempts: 1 Airway Equipment and Method: Stylet and Oral airway Placement Confirmation: ETT inserted through vocal cords under direct vision, positive ETCO2 and breath sounds checked- equal and bilateral Secured at: 24 (at the lip) cm Tube secured with: Tape Dental Injury: Teeth and Oropharynx as per pre-operative assessment

## 2024-11-24 NOTE — Interval H&P Note (Signed)
 History and Physical Interval Note:  11/24/2024 8:41 AM  John Vaughan  has presented today for surgery, with the diagnosis of GALLSTONES.  The various methods of treatment have been discussed with the patient and family. After consideration of risks, benefits and other options for treatment, the patient has consented to  Procedure(s): LAPAROSCOPIC CHOLECYSTECTOMY (N/A) as a surgical intervention.  The patient's history has been reviewed, patient examined, no change in status, stable for surgery.  I have reviewed the patient's chart and labs.  Questions were answered to the patient's satisfaction.     Selen Smucker

## 2024-11-24 NOTE — Op Note (Signed)
 11/24/2024  9:27 AM  PATIENT:  John Vaughan  33 y.o. male  PRE-OPERATIVE DIAGNOSIS:  GALLSTONES  POST-OPERATIVE DIAGNOSIS:  GALLSTONES  PROCEDURE:  Procedure(s): LAPAROSCOPIC CHOLECYSTECTOMY (N/A)  SURGEON:  Surgeons and Role:    DEWAINE Rubin Calamity, MD - Primary  ASSISTANTS: none   ANESTHESIA:   local and general  EBL:  minimal   BLOOD ADMINISTERED:none  DRAINS: none   LOCAL MEDICATIONS USED:  BUPIVICAINE   SPECIMEN:  Source of Specimen:  gallbladder  DISPOSITION OF SPECIMEN:  PATHOLOGY  COUNTS:  YES  TOURNIQUET:  * No tourniquets in log *  DICTATION: .Dragon Dictation   EBL: <5cc   Complications: none   Counts: reported as correct x 2   Findings:acute inflammation of the gallbladder  Indications for procedure: Pt is a 35M with RUQ pain and seen to have gallstones  Details of the procedure: The patient was taken to the operating and placed in the supine position with bilateral SCDs in place. A time out was called and all facts were verified. A pneumoperitoneum was obtained via A Veress needle technique to a pressure of 14mm of mercury. A 5mm trochar was then placed in the right upper quadrant under visualization, and there were no injuries to any abdominal organs. A 11 mm port was then placed in the umbilical region after infiltrating with local anesthesia under direct visualization. A second epigastric port was placed under direct visualization.   The gallbladder was identified and retracted, the peritoneum was then sharply dissected from the gallbladder and this dissection was carried down to Calot's triangle. The cystic duct was identified and dissected circumferentially and seen going into the gallbladder 360.  The cystic artery was dissected away from the surrounding tissues.   The critical angle was obtained.    2 clips were placed proximally one distally and the cystic duct transected. The cystic artery was identified and 2 clips placed proximally  and one distally and transected. We then proceeded to remove the gallbladder off the hepatic fossa with Bovie cautery. A retrieval bag was then placed in the abdomen and gallbladder placed in the bag. The hepatic fossa was then reexamined and hemostasis was achieved with Bovie cautery and was excellent at this portion of the case. The subhepatic fossa and perihepatic fossa was then irrigated until the effluent was clear. The specimen bag and specimen were removed from the abdominal cavity.  The 11 mm trocar fascia was reapproximated with the Endo Close #1 Vicryl x2. The pneumoperitoneum was evacuated and all trochars removed under direct visulalization. The skin was then closed with 4-0 Monocryl and the skin dressed with Dermabond. The patient was awaken from general anesthesia and taken to the recovery room in stable condition.   PLAN OF CARE: Discharge to home after PACU  PATIENT DISPOSITION:  PACU - hemodynamically stable.   Delay start of Pharmacological VTE agent (>24hrs) due to surgical blood loss or risk of bleeding: not applicable

## 2024-11-24 NOTE — Plan of Care (Signed)

## 2024-11-24 NOTE — Discharge Instructions (Signed)

## 2024-11-24 NOTE — Anesthesia Postprocedure Evaluation (Signed)
 Anesthesia Post Note  Patient: John Vaughan  Procedure(s) Performed: LAPAROSCOPIC CHOLECYSTECTOMY     Patient location during evaluation: PACU Anesthesia Type: General Level of consciousness: awake and alert, oriented and patient cooperative Pain management: pain level controlled Vital Signs Assessment: post-procedure vital signs reviewed and stable Respiratory status: spontaneous breathing, nonlabored ventilation and respiratory function stable Cardiovascular status: blood pressure returned to baseline and stable Postop Assessment: no apparent nausea or vomiting Anesthetic complications: no   No notable events documented.  Last Vitals:  Vitals:   11/24/24 1000 11/24/24 1015  BP: 119/79 117/85  Pulse: (!) 58 (!) 52  Resp: 11 12  Temp:    SpO2: 97% 100%    Last Pain:  Vitals:   11/24/24 1015  TempSrc:   PainSc: Asleep                 Almarie CHRISTELLA Marchi

## 2024-11-25 ENCOUNTER — Encounter (HOSPITAL_COMMUNITY): Payer: Self-pay | Admitting: General Surgery

## 2024-11-25 ENCOUNTER — Telehealth: Payer: Self-pay | Admitting: Gastroenterology

## 2024-11-25 LAB — SURGICAL PATHOLOGY

## 2024-11-25 NOTE — Discharge Summary (Signed)
 [facility] Discharge Summary   Patient ID: John Vaughan MRN: [id number] DOB/AGE: [birth year] 33 y.o.  Admit date: 11/23/2024 Discharge date: 11/25/2024  Admitting Diagnosis: Cholecystitis   Discharge Diagnosis Patient Active Problem List   Diagnosis Date Noted   Acute calculous cholecystitis 11/23/2024   Seizure (HCC) 08/17/2024   Alcohol use disorder, severe, dependence (HCC) 08/25/2023   MDD (major depressive disorder) 07/21/2023   Bipolar disorder (HCC) 06/09/2023   Substance induced mood disorder (HCC) 04/25/2023   Cocaine use 08/26/2022   History of seizures 08/26/2022   MDD (major depressive disorder), recurrent, severe, with psychosis (HCC) 06/16/2022   PTSD (post-traumatic stress disorder) 10/25/2021   Suicidal ideations 05/24/2021   Severe recurrent major depression without psychotic features (HCC) 05/24/2021   Bipolar disorder, current episode depressed, severe (HCC) 03/20/2021   Compression fracture of T7 vertebra (HCC) 03/05/2021   COVID-19 virus infection 03/05/2021   Chronic low back pain 07/14/2020   Personal history of scoliosis 07/14/2020   Cannabis use disorder, severe, dependence (HCC) 06/06/2020   Homicidal ideation    Thrombocytopenia 04/09/2020   Benzodiazepine dependence (HCC) 11/17/2019   Drug withdrawal seizure without complication (HCC) 11/17/2019   Elevated liver enzymes 11/17/2019   Tobacco abuse 11/17/2019   GAD (generalized anxiety disorder) 03/09/2019   Alcohol use disorder, severe, in controlled environment, dependence (HCC) 03/09/2019   Reduced libido 04/17/2016    Consultants None  Imaging: CT ABDOMEN PELVIS W CONTRAST Result Date: 11/23/2024 CLINICAL DATA:  Abdominal pain, acute, nonlocalized EXAM: CT ABDOMEN AND PELVIS WITH CONTRAST TECHNIQUE: Multidetector CT imaging of the abdomen and pelvis was performed using the standard protocol following bolus administration of intravenous contrast. RADIATION DOSE  REDUCTION: This exam was performed according to the departmental dose-optimization program which includes automated exposure control, adjustment of the mA and/or kV according to patient size and/or use of iterative reconstruction technique. CONTRAST:  100mL OMNIPAQUE  IOHEXOL  300 MG/ML  SOLN COMPARISON:  None available. FINDINGS: Lower chest: No focal airspace consolidation or pleural effusion. Hepatobiliary: No mass.A couple of punctate radiopaque gallstones. No wall thickening. No intrahepatic or extrahepatic biliary ductal dilation. Pancreas: No mass or main ductal dilation. No peripancreatic inflammation or fluid collection. Spleen: Normal size. No mass. Adrenals/Urinary Tract: No adrenal masses. No renal mass. No nephrolithiasis or hydronephrosis. The urinary bladder is distended without focal abnormality. Stomach/Bowel: The stomach is decompressed without focal abnormality. No small bowel wall thickening or inflammation. No small bowel obstruction.Normal appendix. Vascular/Lymphatic: No aortic aneurysm. No intraabdominal or pelvic lymphadenopathy. Reproductive: No prostatomegaly.No free pelvic fluid. Other: No pneumoperitoneum, ascites, or mesenteric inflammation. Musculoskeletal: No acute fracture or destructive lesion. IMPRESSION: 1. No acute intra-abdominal or pelvic abnormality. 2. A couple of punctate radiopaque gallstones present. No changes of acute cholecystitis. Electronically Signed   By: Rogelia Myers M.D.   On: 11/23/2024 16:01   US  Abdomen Limited RUQ (LIVER/GB) Result Date: 11/23/2024 CLINICAL DATA:  [id number] RUQ pain 151471 EXAM: ULTRASOUND ABDOMEN LIMITED RIGHT UPPER QUADRANT COMPARISON:  June 24, 2023 FINDINGS: Gallbladder: Small shadowing gallstones. No wall thickening or pericholecystic fluid. No sonographic Murphy's sign noted by sonographer. Common bile duct: Diameter: 2.7 mm Liver: Normal echogenicity. No focal lesion identified. No intrahepatic biliary ductal dilation. Portal vein is  patent on color Doppler imaging with normal direction of blood flow towards the liver. Right Kidney: Partially visualized. No mass. No hydronephrosis or nephrolithiasis. Other: None. IMPRESSION: Cholecystolithiasis. No changes of acute cholecystitis. Electronically Signed   By: Rogelia Myers M.D.   On: 11/23/2024 15:42  Procedures Dr. Olen (11/24/24) - Laparoscopic Cholecystectomy    Hospital Course:  33 y/o M who presented to med center with abdominal pain and vomiting.  Workup showed cholecystitis.  Patient was admitted to [facility] long hospital and underwent procedure listed above.  Tolerated procedure well and was transferred to the floor.  Diet was advanced as tolerated.  On POD#1 the patient was voiding well, tolerating diet, ambulating well, pain well controlled, vital signs stable, incisions c/d/i and felt stable for discharge home.  Patient will follow up in our office as below and knows to call with questions or concerns.  He will call to confirm appointment date/time.    I have personally reviewed the patients medication history on the [place] controlled substance database.    Physical Exam: General:  Alert, NAD, pleasant, comfortable Abd:  Soft, ND, mild tenderness, incisions C/D/I  Allergies as of 11/25/2024   No Known Allergies      Medication List     STOP taking these medications    busPIRone  10 MG tablet Commonly known as: BUSPAR        TAKE these medications    Acetaminophen  Extra Strength 500 MG Tabs Take 2 tablets (1,000 mg total) by mouth every 6 (six) hours for 5 days.   ARIPiprazole  5 MG tablet Commonly known as: ABILIFY  Take 1 tablet (5 mg total) by mouth daily.   cloBAZam  10 MG tablet Commonly known as: ONFI  Take 1 tablet (10 mg total) by mouth at bedtime.   esomeprazole  40 MG capsule Commonly known as: NexIUM  Take 1 capsule (40 mg total) by mouth 2 (two) times daily before a meal.   gabapentin  600 MG tablet Commonly known as: NEURONTIN  Take 1  tablet (600 mg total) by mouth 2 (two) times daily.   hydrOXYzine  50 MG tablet Commonly known as: ATARAX  Take 50 mg by mouth as needed for anxiety, itching, nausea or vomiting.   methocarbamol  500 MG tablet Commonly known as: ROBAXIN  Take 1.5 tablets (750 mg total) by mouth every 6 (six) hours as needed for up to 7 days (post-op pain).   nicotine  21 mg/24hr patch Commonly known as: NICODERM CQ  - dosed in mg/24 hours Place 21 mg onto the skin daily.   ondansetron  8 MG disintegrating tablet Commonly known as: ZOFRAN -ODT Take 8 mg by mouth every 8 (eight) hours as needed for nausea, vomiting or refractory nausea / vomiting.   oxcarbazepine  600 MG tablet Commonly known as: TRILEPTAL  Take 1.5 tablets (900 mg total) by mouth 2 (two) times daily.   oxyCODONE 5 MG immediate release tablet Commonly known as: Oxy IR/ROXICODONE Take 1 tablet (5 mg total) by mouth every 6 (six) hours as needed for severe pain (pain score 7-10) (not releived by tylenol  and robaxin ).   polyethylene glycol 17 g packet Commonly known as: MIRALAX / GLYCOLAX Take 17 g by mouth daily as needed for mild constipation.   traZODone  150 MG tablet Commonly known as: DESYREL  Take 1 tablet (150 mg total) by mouth at bedtime as needed for sleep.   vortioxetine  HBr 10 MG Tabs tablet Commonly known as: TRINTELLIX  Take 10 mg by mouth daily.          Follow-up Information     Maczis, Puja Gosai, PA-C Follow up.   Specialty: General Surgery Why: our office is scheduling you for post-operative follow up in 3-4 weeks. call to confirm appointment date/time. Contact information: [address] [place] [address] [phone]  Signed: Almarie Pringle, [facility] 11/25/2024, 8:54 AM

## 2024-11-25 NOTE — Telephone Encounter (Signed)
 Patient had a lap chol. He is calling to cancel the EGD scheduled for 12/04/24. He will call us  back after he is released by his surgeon.

## 2024-11-25 NOTE — Progress Notes (Addendum)
 Patient discharged to home. DC instructions given with male family member at bedside. Verbalized no concerns. Left unit in wheelchair pushed by nurse tech. Left in stable condition. Discharge (TOC) meds also received.

## 2024-11-25 NOTE — Telephone Encounter (Signed)
 Inbound call from patient stating that he would like for the nurse to return his call. Patient did not want to disclose about what it was in regards to. Please advise.

## 2024-11-26 NOTE — Telephone Encounter (Signed)
 Neurology clearance received, however, patient has since cancelled his endoscopy as he just had cholecystectomy.

## 2024-11-26 NOTE — Telephone Encounter (Signed)
 Gregg Lek, MD on 11/18/2024     [facility] [address] [place], [place]  [address] Phone:  [phone]   Fax:  [phone]     November 18, 2024   Patient:  John Vaughan MRN:  [id number] Date of Birth:  1991/07/16       To whom this may concern,   Mr. Bonenberger, DOB 11-19-1991, is a patient of my neurology clinic who was last seen 09/10/2024.  Regarding peri-operative management of GI Bleed ;  there is a small but acceptable risk of peri-operative seizure and TIA/Stroke.  I recommend to continue antiseizure medications through the morning of the surgery.      If you have any questions, or concerns, please don't hesitate to call.   Sincerely,   Dr Gregg

## 2024-11-30 ENCOUNTER — Other Ambulatory Visit: Payer: Self-pay | Admitting: Neurology

## 2024-12-04 ENCOUNTER — Encounter: Payer: MEDICAID | Admitting: Gastroenterology

## 2024-12-09 ENCOUNTER — Emergency Department (HOSPITAL_COMMUNITY): Payer: MEDICAID

## 2024-12-09 ENCOUNTER — Encounter (HOSPITAL_COMMUNITY): Payer: Self-pay

## 2024-12-09 ENCOUNTER — Observation Stay (HOSPITAL_COMMUNITY)
Admission: EM | Admit: 2024-12-09 | Discharge: 2024-12-11 | Disposition: A | Payer: MEDICAID | Source: Home / Self Care | Attending: Emergency Medicine | Admitting: Emergency Medicine

## 2024-12-09 ENCOUNTER — Other Ambulatory Visit: Payer: Self-pay

## 2024-12-09 DIAGNOSIS — I10 Essential (primary) hypertension: Secondary | ICD-10-CM | POA: Diagnosis not present

## 2024-12-09 DIAGNOSIS — K219 Gastro-esophageal reflux disease without esophagitis: Secondary | ICD-10-CM | POA: Insufficient documentation

## 2024-12-09 DIAGNOSIS — R1013 Epigastric pain: Secondary | ICD-10-CM | POA: Diagnosis present

## 2024-12-09 DIAGNOSIS — K529 Noninfective gastroenteritis and colitis, unspecified: Secondary | ICD-10-CM | POA: Insufficient documentation

## 2024-12-09 DIAGNOSIS — F319 Bipolar disorder, unspecified: Secondary | ICD-10-CM | POA: Diagnosis present

## 2024-12-09 DIAGNOSIS — Z9049 Acquired absence of other specified parts of digestive tract: Secondary | ICD-10-CM | POA: Diagnosis not present

## 2024-12-09 DIAGNOSIS — K920 Hematemesis: Secondary | ICD-10-CM | POA: Diagnosis not present

## 2024-12-09 DIAGNOSIS — R1011 Right upper quadrant pain: Secondary | ICD-10-CM | POA: Diagnosis not present

## 2024-12-09 DIAGNOSIS — G8918 Other acute postprocedural pain: Principal | ICD-10-CM | POA: Insufficient documentation

## 2024-12-09 DIAGNOSIS — R7989 Other specified abnormal findings of blood chemistry: Secondary | ICD-10-CM | POA: Diagnosis not present

## 2024-12-09 DIAGNOSIS — K2970 Gastritis, unspecified, without bleeding: Principal | ICD-10-CM | POA: Insufficient documentation

## 2024-12-09 DIAGNOSIS — Z87891 Personal history of nicotine dependence: Secondary | ICD-10-CM | POA: Insufficient documentation

## 2024-12-09 DIAGNOSIS — R7401 Elevation of levels of liver transaminase levels: Secondary | ICD-10-CM | POA: Diagnosis not present

## 2024-12-09 DIAGNOSIS — F411 Generalized anxiety disorder: Secondary | ICD-10-CM | POA: Diagnosis not present

## 2024-12-09 DIAGNOSIS — F1320 Sedative, hypnotic or anxiolytic dependence, uncomplicated: Secondary | ICD-10-CM | POA: Diagnosis not present

## 2024-12-09 DIAGNOSIS — K2960 Other gastritis without bleeding: Secondary | ICD-10-CM | POA: Diagnosis not present

## 2024-12-09 DIAGNOSIS — R112 Nausea with vomiting, unspecified: Secondary | ICD-10-CM

## 2024-12-09 LAB — HEPATITIS PANEL, ACUTE
HCV Ab: NONREACTIVE
Hep A IgM: NONREACTIVE
Hep B C IgM: NONREACTIVE
Hepatitis B Surface Ag: NONREACTIVE

## 2024-12-09 LAB — PROTIME-INR
INR: 1.3 — ABNORMAL HIGH (ref 0.8–1.2)
Prothrombin Time: 16.7 s — ABNORMAL HIGH (ref 11.4–15.2)

## 2024-12-09 LAB — CBC WITH DIFFERENTIAL/PLATELET
Abs Immature Granulocytes: 0.01 K/uL (ref 0.00–0.07)
Basophils Absolute: 0.1 K/uL (ref 0.0–0.1)
Basophils Relative: 2 %
Eosinophils Absolute: 0.3 K/uL (ref 0.0–0.5)
Eosinophils Relative: 7 %
HCT: 43.6 % (ref 39.0–52.0)
Hemoglobin: 15.5 g/dL (ref 13.0–17.0)
Immature Granulocytes: 0 %
Lymphocytes Relative: 20 %
Lymphs Abs: 0.9 K/uL (ref 0.7–4.0)
MCH: 33.9 pg (ref 26.0–34.0)
MCHC: 35.6 g/dL (ref 30.0–36.0)
MCV: 95.4 fL (ref 80.0–100.0)
Monocytes Absolute: 0.5 K/uL (ref 0.1–1.0)
Monocytes Relative: 12 %
Neutro Abs: 2.6 K/uL (ref 1.7–7.7)
Neutrophils Relative %: 59 %
Platelets: 269 K/uL (ref 150–400)
RBC: 4.57 MIL/uL (ref 4.22–5.81)
RDW: 12.5 % (ref 11.5–15.5)
WBC: 4.4 K/uL (ref 4.0–10.5)
nRBC: 0 % (ref 0.0–0.2)

## 2024-12-09 LAB — IRON AND TIBC
Iron: 261 ug/dL — ABNORMAL HIGH (ref 45–182)
Saturation Ratios: 90 % — ABNORMAL HIGH (ref 17.9–39.5)
TIBC: 290 ug/dL (ref 250–450)
UIBC: 29 ug/dL

## 2024-12-09 LAB — COMPREHENSIVE METABOLIC PANEL WITH GFR
ALT: 573 U/L — ABNORMAL HIGH (ref 0–44)
AST: 748 U/L — ABNORMAL HIGH (ref 15–41)
Albumin: 4.2 g/dL (ref 3.5–5.0)
Alkaline Phosphatase: 140 U/L — ABNORMAL HIGH (ref 38–126)
Anion gap: 17 — ABNORMAL HIGH (ref 5–15)
BUN: 10 mg/dL (ref 6–20)
CO2: 20 mmol/L — ABNORMAL LOW (ref 22–32)
Calcium: 9.3 mg/dL (ref 8.9–10.3)
Chloride: 97 mmol/L — ABNORMAL LOW (ref 98–111)
Creatinine, Ser: 1.07 mg/dL (ref 0.61–1.24)
GFR, Estimated: >60 mL/min (ref >60)
Glucose, Bld: 95 mg/dL (ref 70–99)
Potassium: 4 mmol/L (ref 3.5–5.1)
Sodium: 135 mmol/L (ref 135–145)
Total Bilirubin: 3.3 mg/dL — ABNORMAL HIGH (ref 0.0–1.2)
Total Protein: 7 g/dL (ref 6.5–8.1)

## 2024-12-09 LAB — LIPASE, BLOOD: Lipase: 93 U/L — ABNORMAL HIGH (ref 11–51)

## 2024-12-09 LAB — FERRITIN: Ferritin: 6900 ng/mL — ABNORMAL HIGH (ref 24–336)

## 2024-12-09 MED ORDER — ONDANSETRON HCL 4 MG/2ML IJ SOLN: 4.0000 mg | Freq: Four times a day (QID) | INTRAMUSCULAR | Status: DC | PRN

## 2024-12-09 MED ORDER — GABAPENTIN 300 MG PO CAPS
600.0000 mg | ORAL_CAPSULE | Freq: Two times a day (BID) | ORAL | Status: DC
  Administered 2024-12-09 – 2024-12-11 (×4): 600 mg via ORAL
  Filled 2024-12-09 (×4): qty 2

## 2024-12-09 MED ORDER — ALBUTEROL SULFATE (2.5 MG/3ML) 0.083% IN NEBU: 2.5000 mg | INHALATION_SOLUTION | RESPIRATORY_TRACT | Status: DC | PRN

## 2024-12-09 MED ORDER — OXYCODONE HCL 5 MG PO TABS: 5.0000 mg | ORAL_TABLET | ORAL | Status: DC | PRN

## 2024-12-09 MED ORDER — OXCARBAZEPINE 300 MG PO TABS
900.0000 mg | ORAL_TABLET | Freq: Two times a day (BID) | ORAL | Status: DC
  Administered 2024-12-09 – 2024-12-11 (×4): 900 mg via ORAL
  Filled 2024-12-09 (×4): qty 3

## 2024-12-09 MED ORDER — ACETAMINOPHEN 650 MG RE SUPP: 650.0000 mg | Freq: Four times a day (QID) | RECTAL | Status: DC | PRN

## 2024-12-09 MED ORDER — PANTOPRAZOLE SODIUM 40 MG IV SOLR
40.0000 mg | INTRAVENOUS | Status: DC
  Administered 2024-12-09 – 2024-12-10 (×2): 40 mg via INTRAVENOUS
  Filled 2024-12-09 (×2): qty 10

## 2024-12-09 MED ORDER — ONDANSETRON HCL 4 MG/2ML IJ SOLN
4.0000 mg | Freq: Once | INTRAMUSCULAR | Status: AC
  Administered 2024-12-09: 11:00:00 4 mg via INTRAVENOUS
  Filled 2024-12-09: qty 2

## 2024-12-09 MED ORDER — NICOTINE 21 MG/24HR TD PT24
21.0000 mg | MEDICATED_PATCH | Freq: Every day | TRANSDERMAL | Status: DC
  Administered 2024-12-09 – 2024-12-11 (×3): 21 mg via TRANSDERMAL
  Filled 2024-12-09 (×3): qty 1

## 2024-12-09 MED ORDER — HYDROMORPHONE HCL 1 MG/ML IJ SOLN: 0.5000 mg | INTRAMUSCULAR | Status: DC | PRN

## 2024-12-09 MED ORDER — LORAZEPAM 1 MG PO TABS
1.0000 mg | ORAL_TABLET | Freq: Once | ORAL | Status: AC
  Administered 2024-12-10: 06:00:00 1 mg via ORAL
  Filled 2024-12-09: qty 1

## 2024-12-09 MED ORDER — LACTATED RINGERS IV SOLN: INTRAVENOUS | Status: AC

## 2024-12-09 MED ORDER — IOHEXOL 300 MG/ML  SOLN
100.0000 mL | Freq: Once | INTRAMUSCULAR | Status: AC | PRN
  Administered 2024-12-09: 13:00:00 100 mL via INTRAVENOUS

## 2024-12-09 MED ORDER — HYDROXYZINE HCL 25 MG PO TABS
50.0000 mg | ORAL_TABLET | Freq: Three times a day (TID) | ORAL | Status: DC | PRN
  Administered 2024-12-09: 18:00:00 50 mg via ORAL
  Filled 2024-12-09 (×2): qty 2

## 2024-12-09 MED ORDER — ONDANSETRON HCL 4 MG PO TABS: 4.0000 mg | ORAL_TABLET | Freq: Four times a day (QID) | ORAL | Status: DC | PRN

## 2024-12-09 MED ORDER — PANTOPRAZOLE SODIUM 40 MG IV SOLR
40.0000 mg | Freq: Once | INTRAVENOUS | Status: AC
  Administered 2024-12-09: 11:00:00 40 mg via INTRAVENOUS
  Filled 2024-12-09: qty 10

## 2024-12-09 MED ORDER — ENOXAPARIN SODIUM 40 MG/0.4ML IJ SOSY
40.0000 mg | PREFILLED_SYRINGE | INTRAMUSCULAR | Status: DC
  Administered 2024-12-10: 23:00:00 40 mg via SUBCUTANEOUS
  Filled 2024-12-09 (×2): qty 0.4

## 2024-12-09 MED ORDER — PANTOPRAZOLE SODIUM 40 MG PO TBEC: 40.0000 mg | DELAYED_RELEASE_TABLET | Freq: Every day | ORAL | Status: DC

## 2024-12-09 MED ORDER — TRAZODONE HCL 50 MG PO TABS
150.0000 mg | ORAL_TABLET | Freq: Every evening | ORAL | Status: DC | PRN
  Administered 2024-12-09 – 2024-12-10 (×2): 150 mg via ORAL
  Filled 2024-12-09 (×2): qty 1

## 2024-12-09 MED ORDER — ONDANSETRON HCL 4 MG/2ML IJ SOLN
4.0000 mg | Freq: Once | INTRAMUSCULAR | Status: AC
  Administered 2024-12-09: 16:00:00 4 mg via INTRAVENOUS
  Filled 2024-12-09: qty 2

## 2024-12-09 MED ORDER — ACETAMINOPHEN 325 MG PO TABS: 650.0000 mg | ORAL_TABLET | Freq: Four times a day (QID) | ORAL | Status: DC | PRN

## 2024-12-09 NOTE — ED Triage Notes (Signed)
 Cholecystectomy 2 weeks ago. Pt has had continued RUQ pain and vomiting, started vomiting blood this AM.

## 2024-12-09 NOTE — ED Provider Triage Note (Signed)
 Emergency Medicine Provider Triage Evaluation Note  John Vaughan , a 33 y.o. male  was evaluated in triage.  Pt complains of abdominal pain, nausea, vomiting.  Review of Systems  Positive:  Negative:   Physical Exam  BP 126/83 (BP Location: Left Arm)   Pulse 83   Temp 98.2 F (36.8 C) (Oral)   Resp 17   SpO2 98%  Gen:   Awake, no distress   Resp:  Normal effort  MSK:   Moves extremities without difficulty  Other:    Medical Decision Making  Medically screening exam initiated at 10:39 AM.  Appropriate orders placed.  John Vaughan was informed that the remainder of the evaluation will be completed by another provider, this initial triage assessment does not replace that evaluation, and the importance of remaining in the ED until their evaluation is complete.  CHOLECYSTECTOMY  2 weeks ago. Since discharge, patient has been vomiting and continues to have pain in RUQ. Also with fevers and chills. Endorses diarrhea. Last dose of zofran  yesterday afternoon which provides some relief. Patient and family member at bedside concerned for blood in vomit this morning.     Hoy Nidia FALCON, [place] 12/09/24 1041

## 2024-12-09 NOTE — Plan of Care (Signed)

## 2024-12-09 NOTE — ED Provider Notes (Signed)
 [place] EMERGENCY DEPARTMENT AT [facility] Provider Note   CSN: [id number] Arrival date & time: 12/09/24  1022     Patient presents with: Post-op Problem   John Vaughan is a 33 y.o. male here with abdominal pain, nausea and vomiting.  Underwent cholecystectomy laparoscopically with Dr Rubin general surgery on 11/24/24 per chart review.  Reports he has had persistent nausea and abdominal pain since his procedure.  He said he was having morning nausea and postprandial nausea prior to the procedure, but now mostly has morning nausea.  He did not eat or drink today.  He says he got Zofran  on arrival in the ED and has helped a little bit.  He does not want pain medicine.  He did have an ultrasound performed prior to surgery which noted that he had gallstones.  He is here with his fiance   HPI     Prior to Admission medications  Medication Sig Start Date End Date Taking? Authorizing Provider  ARIPiprazole  (ABILIFY ) 5 MG tablet Take 1 tablet (5 mg total) by mouth daily. 07/27/23 02/03/36  Massengill, Rankin, MD  cloBAZam  (ONFI ) 10 MG tablet Take 1 tablet (10 mg total) by mouth at bedtime. 07/09/24   Camara, Amadou, MD  esomeprazole  (NEXIUM ) 40 MG capsule Take 1 capsule (40 mg total) by mouth 2 (two) times daily before a meal. 11/17/24   Heinz, Camie BRAVO, PA-C  gabapentin  (NEURONTIN ) 600 MG tablet Take 1 tablet (600 mg total) by mouth 2 (two) times daily. 08/21/24   Shelton Arlin KIDD, MD  hydrOXYzine  (ATARAX ) 50 MG tablet Take 50 mg by mouth as needed for anxiety, itching, nausea or vomiting. 10/01/23   [provider]  nicotine  (NICODERM CQ  - DOSED IN MG/24 HOURS) 21 mg/24hr patch Place 21 mg onto the skin daily. 11/27/23   [provider]  ondansetron  (ZOFRAN -ODT) 8 MG disintegrating tablet Take 8 mg by mouth every 8 (eight) hours as needed for nausea, vomiting or refractory nausea / vomiting. 08/14/23   [provider]  oxcarbazepine  (TRILEPTAL ) 600 MG  tablet Take 1.5 tablets (900 mg total) by mouth 2 (two) times daily. 05/21/24 12/17/24  Camara, Amadou, MD  oxyCODONE  (OXY IR/ROXICODONE ) 5 MG immediate release tablet Take 1 tablet (5 mg total) by mouth every 6 (six) hours as needed for severe pain (pain score 7-10) (not releived by tylenol  and robaxin ). 11/24/24   Augustus Almarie RAMAN, PA-C  polyethylene glycol (MIRALAX  / GLYCOLAX ) 17 g packet Take 17 g by mouth daily as needed for mild constipation. 11/24/24   Augustus Almarie RAMAN, PA-C  traZODone  (DESYREL ) 150 MG tablet Take 1 tablet (150 mg total) by mouth at bedtime as needed for sleep. 07/26/23 02/02/25  Massengill, Rankin, MD  vortioxetine  HBr (TRINTELLIX ) 10 MG TABS tablet Take 10 mg by mouth daily.    [provider]    Allergies: Patient has no known allergies.    Review of Systems  Updated Vital Signs BP 101/62 (BP Location: Right Arm)   Pulse 78   Temp 97.9 F (36.6 C) (Oral)   Resp 18   Ht 5' 11 (1.803 m)   Wt 92 kg   SpO2 98%   BMI 28.29 kg/m   Physical Exam Constitutional:      General: He is not in acute distress. HENT:     Head: Normocephalic and atraumatic.  Eyes:     Conjunctiva/sclera: Conjunctivae normal.     Pupils: Pupils are equal, round, and reactive to light.  Cardiovascular:     Rate and Rhythm: Normal rate and regular rhythm.  Pulmonary:     Effort: Pulmonary effort is normal. No respiratory distress.  Abdominal:     General: There is no distension.     Comments: Laparoscopic incision sites appear clean, intact, nonpurulent, there is some vague generalized tenderness most focally in the right upper quadrant, some rebound, no guarding  Skin:    General: Skin is warm and dry.  Neurological:     General: No focal deficit present.     Mental Status: He is alert. Mental status is at baseline.  Psychiatric:        Mood and Affect: Mood normal.        Behavior: Behavior normal.     (all labs ordered are listed, but only abnormal results are  displayed) Labs Reviewed  COMPREHENSIVE METABOLIC PANEL WITH GFR - Abnormal; Notable for the following components:      Result Value   Chloride 97 (*)    CO2 20 (*)    AST 748 (*)    ALT 573 (*)    Alkaline Phosphatase 140 (*)    Total Bilirubin 3.3 (*)    Anion gap 17 (*)    All other components within normal limits  LIPASE, BLOOD - Abnormal; Notable for the following components:   Lipase 93 (*)    All other components within normal limits  CBC WITH DIFFERENTIAL/PLATELET  HEPATITIS PANEL, ACUTE  IRON AND TIBC  FERRITIN  PROTIME-INR  IGG  ANA W/REFLEX IF POSITIVE  MITOCHONDRIAL ANTIBODIES  ANTI-SMOOTH MUSCLE ANTIBODY, IGG  ALPHA-1-ANTITRYPSIN  CERULOPLASMIN  URINALYSIS, ROUTINE W REFLEX MICROSCOPIC    EKG: None  Radiology: US  Abdomen Limited RUQ (LIVER/GB) Result Date: 12/09/2024 CLINICAL DATA:  Abdominal pain. EXAM: ULTRASOUND ABDOMEN LIMITED RIGHT UPPER QUADRANT COMPARISON:  Right upper quadrant ultrasound dated 11/23/2024 and CT dated 12/09/2024. FINDINGS: Gallbladder: Cholecystectomy. Common bile duct: Diameter: 2 mm Liver: There is diffuse increased liver echogenicity most commonly seen in the setting of fatty infiltration. Superimposed inflammation or fibrosis is not excluded. Clinical correlation is recommended. Portal vein is patent on color Doppler imaging with normal direction of blood flow towards the liver. Other: None. IMPRESSION: 1. Fatty liver. 2. Cholecystectomy. Electronically Signed   By: Vanetta Chou M.D.   On: 12/09/2024 16:13   CT ABDOMEN PELVIS W CONTRAST Result Date: 12/09/2024 EXAM: CT ABDOMEN AND PELVIS WITH CONTRAST 12/09/2024 12:55:12 PM TECHNIQUE: CT of the abdomen and pelvis was performed with the administration of 100 mL iohexol  (OMNIPAQUE ) 300 MG/ML solution. Multiplanar reformatted images are provided for review. Automated exposure control, iterative reconstruction, and/or weight-based adjustment of the mA/kV was utilized to reduce the  radiation dose to as low as reasonably achievable. COMPARISON: 11/23/2024 CLINICAL HISTORY: Abdominal pain, post-op FINDINGS: LOWER CHEST: No acute abnormality. LIVER: Diffuse hepatic steatosis. GALLBLADDER AND BILE DUCTS: Status post cholecystectomy. No biliary ductal dilatation. SPLEEN: No acute abnormality. PANCREAS: No acute abnormality. ADRENAL GLANDS: No acute abnormality. KIDNEYS, URETERS AND BLADDER: No stones in the kidneys or ureters. No hydronephrosis. No perinephric or periureteral stranding. The urinary bladder is diffusely distended measuring 11.2 x 13.5 x 18.8 cm with a volume of 1421 cc. GI AND BOWEL: Stomach demonstrates no acute abnormality. There is no pathologic dilatation of the bowel loops to suggest obstruction or postop ileus. Intramural fatty deposition is again noted involving the ascending colon. Mild wall thickening of the ascending colon is noted without surrounding inflammatory fat stranding. The remaining portions of the colon  are within normal limits. PERITONEUM AND RETROPERITONEUM: No free fluid or fluid collections identified within the abdomen or pelvis. No free air. VASCULATURE: Aorta is normal in caliber. LYMPH NODES: No lymphadenopathy. REPRODUCTIVE ORGANS: No acute abnormality. BONES AND SOFT TISSUES: No acute osseous abnormality. Postoperative changes within the periumbilical ventral abdominal wall, image 45/2. No signs of abdominal wall fluid collection. IMPRESSION: 1. No acute intra-abdominal or pelvic abnormality, with postoperative periumbilical ventral abdominal wall changes and no fluid collection. 2. Marked urinary bladder distention. 3. Intramural fatty deposition and mild wall thickening of the ascending colon without surrounding inflammatory change. Although nonspecific, these findings may be seen in the setting of chronic colitis. Electronically signed by: Waddell Calk MD 12/09/2024 02:04 PM EST RP Workstation: [id number]     Procedures   Medications Ordered  in the ED  lactated ringers  infusion ( Intravenous New Bag/Given 12/09/24 1658)  hydrOXYzine  (ATARAX ) tablet 50 mg (has no administration in time range)  nicotine  (NICODERM CQ  - dosed in mg/24 hours) patch 21 mg (has no administration in time range)  traZODone  (DESYREL ) tablet 150 mg (has no administration in time range)  Oxcarbazepine  (TRILEPTAL ) tablet 900 mg (has no administration in time range)  gabapentin  (NEURONTIN ) tablet 600 mg (has no administration in time range)  enoxaparin  (LOVENOX ) injection 40 mg (has no administration in time range)  ondansetron  (ZOFRAN ) tablet 4 mg (has no administration in time range)    Or  ondansetron  (ZOFRAN ) injection 4 mg (has no administration in time range)  albuterol  (PROVENTIL ) (2.5 MG/3ML) 0.083% nebulizer solution 2.5 mg (has no administration in time range)  oxyCODONE  (Oxy IR/ROXICODONE ) immediate release tablet 5 mg (has no administration in time range)  HYDROmorphone  (DILAUDID ) injection 0.5-1 mg (has no administration in time range)  pantoprazole  (PROTONIX ) injection 40 mg (has no administration in time range)  ondansetron  (ZOFRAN ) injection 4 mg (4 mg Intravenous Given 12/09/24 1054)  pantoprazole  (PROTONIX ) injection 40 mg (40 mg Intravenous Given 12/09/24 1057)  iohexol  (OMNIPAQUE ) 300 MG/ML solution 100 mL (100 mLs Intravenous Contrast Given 12/09/24 1244)  ondansetron  (ZOFRAN ) injection 4 mg (4 mg Intravenous Given 12/09/24 1531)                                    Medical Decision Making Amount and/or Complexity of Data Reviewed Labs: ordered. Radiology: ordered.  Risk Prescription drug management. Decision regarding hospitalization.   This patient presents to the ED with concern for abdominal pain, nausea, vomiting. This involves an extensive number of treatment options, and is a complaint that carries with it a high risk of complications and morbidity.  The differential diagnosis includes postoperative infection versus abscess  versus biliary obstruction versus pancreatitis versus colitis versus other  Additional history obtained from fianc at bedside  External records from outside source obtained and reviewed including discharge records from the hospital and surgical operative record  I ordered and personally interpreted labs.  The pertinent results include: Newly elevated transaminitis, T. bili Ruben 3.3, alk phos 140.  Lipase 93.  White blood cell count within normal limit  I ordered imaging studies including CT abdomen pelvis with contrast I independently visualized and interpreted imaging which showed no postoperative abscess noted, but there is note of possibly chronic wall thickening of the ascending colon; no biliary ductal dilatation noted I agree with the radiologist interpretation  The patient was maintained on a cardiac monitor.  I personally viewed and interpreted the cardiac  monitored which showed an underlying rhythm of: Sinus rhythm  I ordered medication including IV nausea medication and Protonix  for suspected gastritis, nausea  I have reviewed the patients home medicines and have made adjustments as needed  I do not see any clear postoperative complication that would warrant an emergent general surgery consultation.  I also do not see clear evidence that the colonic inflammation is infectious in etiology.  No fever or leukocytosis.  I will defer on antibiotics at this time, unless the specialist team recommends this.  Test Considered:   I requested consultation with the general surgery,  and discussed lab and imaging findings as well as pertinent plan - they recommend: Medical admission, follow-up on labs, trending labs, ultrasound imaging and potential MRI, final consultant   After the interventions noted above, I reevaluated the patient and found that they have: stayed the same   Dispostion:  After consideration of the diagnostic results and the patients response to treatment, I feel  that the patent would benefit from hospitalist admission.      Final diagnoses:  Post-operative pain  Nausea and vomiting, unspecified vomiting type    ED Discharge Orders     None          Cottie Donnice PARAS, MD 12/09/24 1700

## 2024-12-09 NOTE — H&P (Signed)
 History and Physical  Nas Wafer FMW:[id number] DOB: Apr 28, 1991 DOA: 12/09/2024  PCP: Mavis Redge SAILOR, FNP   Chief Complaint: Abdominal pain, nausea  HPI: John Vaughan is a 33 y.o. male with medical history significant for bipolar depression, anxiety, schizophrenia and previous cocaine and alcohol abuse being admitted to the hospital with several weeks of epigastric abdominal pain with nausea and vomiting.  He tells me that he has had this discomfort since prior to his uncomplicated laparoscopic cholecystectomy which happened 2 weeks ago.  He has had persistent nausea, diarrhea and vomiting.  He came to the ER today because this morning he vomited up some flecks of blood, which was a change.  Denies any fevers, or blood in his stool.  No sick contacts, denies IV drug use or recent alcohol use.  Review of Systems: Please see HPI for pertinent positives and negatives. A complete 10 system review of systems are otherwise negative.  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   Past Surgical History:  Procedure Laterality Date   CHOLECYSTECTOMY N/A 11/24/2024   Procedure: LAPAROSCOPIC CHOLECYSTECTOMY;  Surgeon: Rubin Calamity, MD;  Location: [facility];  Service: General;  Laterality: N/A;   COLONOSCOPY  11/28/2023   HAND SURGERY Right    TYMPANOSTOMY TUBE PLACEMENT     UPPER GASTROINTESTINAL ENDOSCOPY  11/28/2023   WISDOM TOOTH EXTRACTION     Social History:  reports that he has been smoking cigarettes. He started smoking about 16 years ago. He has a 23.9 pack-year smoking history. He quit smokeless tobacco use about 16 months ago.  His smokeless tobacco use included snuff. He  reports that he does not currently use alcohol after a past usage of about 28.0 standard drinks of alcohol per week. He reports that he does not currently use drugs after having used the following drugs: Cocaine.  Allergies[1]  Family History  Problem Relation Age of Onset   Anxiety disorder Mother    Interstitial cystitis Mother    COPD Mother    Heart disease Mother    Alcohol abuse Father    COPD Father    Seizures Father    Heart disease Father    Anxiety disorder Maternal Aunt    Anxiety disorder Maternal Grandmother    Diabetes Maternal Grandmother    Heart disease Maternal Grandfather    Diabetes Paternal Grandmother    Alcohol abuse Paternal Grandfather    Heart disease Paternal Grandfather    Autism Son    ADD / ADHD Son    Colon cancer Neg Hx    Esophageal cancer Neg Hx    Rectal cancer Neg Hx    Stomach cancer Neg Hx      Prior to Admission medications  Medication Sig Start Date End Date Taking? Authorizing Provider  ARIPiprazole  (ABILIFY ) 5 MG tablet Take 1 tablet (5 mg total) by mouth daily. 07/27/23 02/03/36  Massengill, Rankin, MD  cloBAZam  (ONFI ) 10 MG tablet Take 1 tablet (10 mg total) by mouth at bedtime. 07/09/24   Camara, Amadou, MD  esomeprazole  (NEXIUM ) 40 MG capsule Take  1 capsule (40 mg total) by mouth 2 (two) times daily before a meal. 11/17/24   Heinz, Camie BRAVO, PA-C  gabapentin  (NEURONTIN ) 600 MG tablet Take 1 tablet (600 mg total) by mouth 2 (two) times daily. 08/21/24   Shelton Arlin KIDD, MD  hydrOXYzine  (ATARAX ) 50 MG tablet Take 50 mg by mouth as needed for anxiety, itching, nausea or vomiting. 10/01/23   [provider]  nicotine  (NICODERM CQ  - DOSED IN MG/24 HOURS) 21 mg/24hr patch Place 21 mg onto the skin daily. 11/27/23   [provider]  ondansetron  (ZOFRAN -ODT) 8 MG disintegrating tablet Take 8 mg by mouth every 8 (eight) hours as needed for nausea, vomiting or refractory nausea / vomiting. 08/14/23   [provider]   oxcarbazepine  (TRILEPTAL ) 600 MG tablet Take 1.5 tablets (900 mg total) by mouth 2 (two) times daily. 05/21/24 12/17/24  Camara, Amadou, MD  oxyCODONE  (OXY IR/ROXICODONE ) 5 MG immediate release tablet Take 1 tablet (5 mg total) by mouth every 6 (six) hours as needed for severe pain (pain score 7-10) (not releived by tylenol  and robaxin ). 11/24/24   Augustus Almarie RAMAN, PA-C  polyethylene glycol (MIRALAX  / GLYCOLAX ) 17 g packet Take 17 g by mouth daily as needed for mild constipation. 11/24/24   Augustus Almarie RAMAN, PA-C  traZODone  (DESYREL ) 150 MG tablet Take 1 tablet (150 mg total) by mouth at bedtime as needed for sleep. 07/26/23 02/02/25  Massengill, Rankin, MD  vortioxetine  HBr (TRINTELLIX ) 10 MG TABS tablet Take 10 mg by mouth daily.    [provider]    Physical Exam: BP 101/62 (BP Location: Right Arm)   Pulse 78   Temp 97.9 F (36.6 C) (Oral)   Resp 18   Ht 5' 11 (1.803 m)   Wt 92 kg   SpO2 98%   BMI 28.29 kg/m  General:  Alert, oriented, calm, in no acute distress  Eyes: EOMI, clear conjuctivae, white sclerea Neck: supple, no masses, trachea mildline  Cardiovascular: RRR, no murmurs or rubs, no peripheral edema  Respiratory: clear to auscultation bilaterally, no wheezes, no crackles  Abdomen: soft, diffusely tender, nondistended, normal bowel tones heard  Skin: dry, no rashes  Musculoskeletal: no joint effusions, normal range of motion  Psychiatric: appropriate affect, normal speech  Neurologic: extraocular muscles intact, clear speech, moving all extremities with intact sensorium         Labs on Admission:  Basic Metabolic Panel: Recent Labs  Lab 12/09/24 1050  NA 135  K 4.0  CL 97*  CO2 20*  GLUCOSE 95  BUN 10  CREATININE 1.07  CALCIUM 9.3   Liver Function Tests: Recent Labs  Lab 12/09/24 1050  AST 748*  ALT 573*  ALKPHOS 140*  BILITOT 3.3*  PROT 7.0  ALBUMIN 4.2   Recent Labs  Lab 12/09/24 1050  LIPASE 93*   No results for input(s):  AMMONIA in the last 168 hours. CBC: Recent Labs  Lab 12/09/24 1050  WBC 4.4  NEUTROABS 2.6  HGB 15.5  HCT 43.6  MCV 95.4  PLT 269   Cardiac Enzymes: No results for input(s): CKTOTAL, CKMB, CKMBINDEX, TROPONINI in the last 168 hours. BNP (last 3 results) No results for input(s): BNP in the last 8760 hours.  ProBNP (last 3 results) No results for input(s): PROBNP in the last 8760 hours.  CBG: No results for input(s): GLUCAP in the last 168 hours.  Radiological Exams on Admission: US  Abdomen Limited RUQ (LIVER/GB) Result Date: 12/09/2024 CLINICAL DATA:  Abdominal  pain. EXAM: ULTRASOUND ABDOMEN LIMITED RIGHT UPPER QUADRANT COMPARISON:  Right upper quadrant ultrasound dated 11/23/2024 and CT dated 12/09/2024. FINDINGS: Gallbladder: Cholecystectomy. Common bile duct: Diameter: 2 mm Liver: There is diffuse increased liver echogenicity most commonly seen in the setting of fatty infiltration. Superimposed inflammation or fibrosis is not excluded. Clinical correlation is recommended. Portal vein is patent on color Doppler imaging with normal direction of blood flow towards the liver. Other: None. IMPRESSION: 1. Fatty liver. 2. Cholecystectomy. Electronically Signed   By: Vanetta Chou M.D.   On: 12/09/2024 16:13   CT ABDOMEN PELVIS W CONTRAST Result Date: 12/09/2024 EXAM: CT ABDOMEN AND PELVIS WITH CONTRAST 12/09/2024 12:55:12 PM TECHNIQUE: CT of the abdomen and pelvis was performed with the administration of 100 mL iohexol  (OMNIPAQUE ) 300 MG/ML solution. Multiplanar reformatted images are provided for review. Automated exposure control, iterative reconstruction, and/or weight-based adjustment of the mA/kV was utilized to reduce the radiation dose to as low as reasonably achievable. COMPARISON: 11/23/2024 CLINICAL HISTORY: Abdominal pain, post-op FINDINGS: LOWER CHEST: No acute abnormality. LIVER: Diffuse hepatic steatosis. GALLBLADDER AND BILE DUCTS: Status post  cholecystectomy. No biliary ductal dilatation. SPLEEN: No acute abnormality. PANCREAS: No acute abnormality. ADRENAL GLANDS: No acute abnormality. KIDNEYS, URETERS AND BLADDER: No stones in the kidneys or ureters. No hydronephrosis. No perinephric or periureteral stranding. The urinary bladder is diffusely distended measuring 11.2 x 13.5 x 18.8 cm with a volume of 1421 cc. GI AND BOWEL: Stomach demonstrates no acute abnormality. There is no pathologic dilatation of the bowel loops to suggest obstruction or postop ileus. Intramural fatty deposition is again noted involving the ascending colon. Mild wall thickening of the ascending colon is noted without surrounding inflammatory fat stranding. The remaining portions of the colon are within normal limits. PERITONEUM AND RETROPERITONEUM: No free fluid or fluid collections identified within the abdomen or pelvis. No free air. VASCULATURE: Aorta is normal in caliber. LYMPH NODES: No lymphadenopathy. REPRODUCTIVE ORGANS: No acute abnormality. BONES AND SOFT TISSUES: No acute osseous abnormality. Postoperative changes within the periumbilical ventral abdominal wall, image 45/2. No signs of abdominal wall fluid collection. IMPRESSION: 1. No acute intra-abdominal or pelvic abnormality, with postoperative periumbilical ventral abdominal wall changes and no fluid collection. 2. Marked urinary bladder distention. 3. Intramural fatty deposition and mild wall thickening of the ascending colon without surrounding inflammatory change. Although nonspecific, these findings may be seen in the setting of chronic colitis. Electronically signed by: Waddell Calk MD 12/09/2024 02:04 PM EST RP Workstation: [id number]   Assessment/Plan  Stephane Niemann is a 33 y.o. male with medical history significant for bipolar depression, anxiety, schizophrenia and previous cocaine and alcohol abuse being admitted to the hospital with several weeks of epigastric abdominal pain with nausea and  vomiting found to have abnormal LFTs.  Abdominal pain, nausea, hepatitis of unclear etiology-he has had rapid elevation of his AST and ALT since it was last checked in early December.  Interestingly, he has elevated alk phos and bilirubin of 3.3, without biliary dilation.  I wonder if he recently passed a stone.  May also have an infectious or autoimmune hepatitis. -Observation admission -Avoid hepatotoxins -Check acute hepatitis panel, ferritin, INR, ANA, antimitochondrial anti-smooth muscle antibodies, alpha 1 antitrypsin, ceruloplasmin -Trend LFTs, GI consult appreciated  Tobacco abuse-nicotine  patch  Ascending colitis-without obvious surrounding inflammatory change, he has had intermittent diarrhea over the last week but this is resolving.  Given lack of fever, leukocytosis or worsening diarrhea, GI recommends observation for the time being, no antibiotics.  GERD-Protonix   daily  Bipolar depression/neutropenia-continue gabapentin , Trileptal   DVT prophylaxis: Lovenox      Code Status: Full Code  Consults called: [facility]  Admission status: Observation  Time spent: 49 minutes  Harvy Riera CHRISTELLA Gail MD [facility] Pager [phone]  If 7PM-7AM, please contact night-coverage [email or web address] Password [email or web address]  12/09/2024, 4:50 PM      [1] No Known Allergies

## 2024-12-09 NOTE — Consult Note (Cosign Needed Addendum)
 Referring Provider: EDP, Dr. Cottie Primary Care Physician:  Mavis Redge SAILOR, FNP Primary Gastroenterologist:  Dr. Shila  Reason for Consultation:  Elevated LFTs and vomiting blood  HPI: John Vaughan is a 33 y.o. male with past medical history of ADHD, anxiety, autism, bipolar disorder, depression, GERD, hypertension, hyperlipidemia, schizophrenia, history of seizures, sleep apnea, and substance abuse with previous alcohol and cocaine use (none in almost 2 years).  He is 2 weeks status post cholecystectomy with ongoing complaints of right upper quadrant abdominal pain and nausea and vomiting.  Had and episode of bloody emesis this morning, prompting his emergency room visit.  Hemoglobin normal.  Lipase slightly elevated at 93 and LFTs significantly elevated with total bili 3.3, alk phos 140, ALT 573, AST 748.  BUN normal.  Ultrasound shows only fatty liver.  CT scan abdomen and pelvis with contrast: IMPRESSION: 1. No acute intra-abdominal or pelvic abnormality, with postoperative periumbilical ventral abdominal wall changes and no fluid collection. 2. Marked urinary bladder distention. 3. Intramural fatty deposition and mild wall thickening of the ascending colon without surrounding inflammatory change. Although nonspecific, these findings may be seen in the setting of chronic colitis.  He tells me that he has been vomiting about every other day.  This morning had a noticeable amount of blood in it, which obviously scared him.  He says that his pain in his right upper quadrant is still present, but is different, more so with coughing, etc, but does worsen somewhat after eating as well.  Has been having diarrhea for about the past week or so as well, but does not typically have diarrhea as a chronic issue.  No red blood in his stool.  He does report dark/black stools, which he reported previously, but does use Pepto-Bismol regularly.  Has been using Tums as well.  Takes PPI, but  that he had run out recently so took one of his mother.  Not sure about consistency of his use with that.  Taking Zofran  at home for nausea.  No NSAID use.  He tells me that he has not used any drugs, last was cocaine and early 2024.  He also has not drank ETOH in several months.   Colonoscopy 11/2023: - The examined portion of the ileum was normal. - Normal mucosa in the entire examined colon. Biopsied. - Non- bleeding internal hemorrhoids. - The examination was otherwise normal.  EGD 11/2023: - Benign- appearing esophageal stenosis. Dilated. - LA Grade A reflux esophagitis with no bleeding. - 2 cm hiatal hernia. - Normal stomach. - Normal examined duodenum. - Biopsies were taken with a cold forceps for evaluation of eosinophilic esophagitis.   1. Surgical [P], distal esophagus:      - ESOPHAGEAL SQUAMOUS MUCOSA WITH NO SPECIFIC HISTOPATHOLOGIC CHANGES      - NEGATIVE FOR INCREASED INTRAEPITHELIAL EOSINOPHILS       2. Surgical [P], proximal esophagus :      - ESOPHAGEAL SQUAMOUS MUCOSA WITH NO SPECIFIC HISTOPATHOLOGIC CHANGES      - NEGATIVE FOR INCREASED INTRAEPITHELIAL EOSINOPHILS       3. Surgical [P], random colon:      - COLONIC MUCOSA WITH NO SPECIFIC HISTOPATHOLOGIC CHANGES      - NEGATIVE FOR ACUTE INFLAMMATION, INCREASED INTRAEPITHELIAL LYMPHOCYTES OR      THICKENED SUBEPITHELIAL COLLAGEN TABLE    Past Medical History:  Diagnosis Date   ADHD    Anxiety    Autism    Bipolar 2 disorder (HCC)  Depression    GERD (gastroesophageal reflux disease)    HLD (hyperlipidemia)    HTN (hypertension)    Neuropathy    Schizophrenia (HCC)    Seizures (HCC)    Sleep apnea    does not use cpap   Substance abuse (HCC) 06/28/2023   alcohol    Past Surgical History:  Procedure Laterality Date   CHOLECYSTECTOMY N/A 11/24/2024   Procedure: LAPAROSCOPIC CHOLECYSTECTOMY;  Surgeon: Rubin Calamity, MD;  Location: [facility];  Service: General;  Laterality: N/A;   COLONOSCOPY  11/28/2023    HAND SURGERY Right    TYMPANOSTOMY TUBE PLACEMENT     UPPER GASTROINTESTINAL ENDOSCOPY  11/28/2023   WISDOM TOOTH EXTRACTION      Prior to Admission medications  Medication Sig Start Date End Date Taking? Authorizing Provider  ARIPiprazole  (ABILIFY ) 5 MG tablet Take 1 tablet (5 mg total) by mouth daily. 07/27/23 02/03/36  Massengill, Rankin, MD  cloBAZam  (ONFI ) 10 MG tablet Take 1 tablet (10 mg total) by mouth at bedtime. 07/09/24   Camara, Amadou, MD  esomeprazole  (NEXIUM ) 40 MG capsule Take 1 capsule (40 mg total) by mouth 2 (two) times daily before a meal. 11/17/24   Heinz, Camie BRAVO, PA-C  gabapentin  (NEURONTIN ) 600 MG tablet Take 1 tablet (600 mg total) by mouth 2 (two) times daily. 08/21/24   Shelton Arlin KIDD, MD  hydrOXYzine  (ATARAX ) 50 MG tablet Take 50 mg by mouth as needed for anxiety, itching, nausea or vomiting. 10/01/23   [provider]  nicotine  (NICODERM CQ  - DOSED IN MG/24 HOURS) 21 mg/24hr patch Place 21 mg onto the skin daily. 11/27/23   [provider]  ondansetron  (ZOFRAN -ODT) 8 MG disintegrating tablet Take 8 mg by mouth every 8 (eight) hours as needed for nausea, vomiting or refractory nausea / vomiting. 08/14/23   [provider]  oxcarbazepine  (TRILEPTAL ) 600 MG tablet Take 1.5 tablets (900 mg total) by mouth 2 (two) times daily. 05/21/24 12/17/24  Gregg Lek, MD  oxyCODONE  (OXY IR/ROXICODONE ) 5 MG immediate release tablet Take 1 tablet (5 mg total) by mouth every 6 (six) hours as needed for severe pain (pain score 7-10) (not releived by tylenol  and robaxin ). 11/24/24   Augustus Almarie RAMAN, PA-C  polyethylene glycol (MIRALAX  / GLYCOLAX ) 17 g packet Take 17 g by mouth daily as needed for mild constipation. 11/24/24   Augustus Almarie RAMAN, PA-C  traZODone  (DESYREL ) 150 MG tablet Take 1 tablet (150 mg total) by mouth at bedtime as needed for sleep. 07/26/23 02/02/25  Massengill, Rankin, MD  vortioxetine  HBr (TRINTELLIX ) 10 MG TABS tablet Take 10 mg by mouth  daily.    [provider]    Current Facility-Administered Medications  Medication Dose Route Frequency Provider Last Rate Last Admin   ondansetron  (ZOFRAN ) injection 4 mg  4 mg Intravenous Once Trifan, Donnice PARAS, MD       Current Outpatient Medications  Medication Sig Dispense Refill   ARIPiprazole  (ABILIFY ) 5 MG tablet Take 1 tablet (5 mg total) by mouth daily. 30 tablet 0   cloBAZam  (ONFI ) 10 MG tablet Take 1 tablet (10 mg total) by mouth at bedtime. 30 tablet 5   esomeprazole  (NEXIUM ) 40 MG capsule Take 1 capsule (40 mg total) by mouth 2 (two) times daily before a meal. 60 capsule 2   gabapentin  (NEURONTIN ) 600 MG tablet Take 1 tablet (600 mg total) by mouth 2 (two) times daily.     hydrOXYzine  (ATARAX ) 50 MG tablet Take 50 mg by mouth as  needed for anxiety, itching, nausea or vomiting.     nicotine  (NICODERM CQ  - DOSED IN MG/24 HOURS) 21 mg/24hr patch Place 21 mg onto the skin daily.     ondansetron  (ZOFRAN -ODT) 8 MG disintegrating tablet Take 8 mg by mouth every 8 (eight) hours as needed for nausea, vomiting or refractory nausea / vomiting.     oxcarbazepine  (TRILEPTAL ) 600 MG tablet Take 1.5 tablets (900 mg total) by mouth 2 (two) times daily. 90 tablet 6   oxyCODONE  (OXY IR/ROXICODONE ) 5 MG immediate release tablet Take 1 tablet (5 mg total) by mouth every 6 (six) hours as needed for severe pain (pain score 7-10) (not releived by tylenol  and robaxin ). 15 tablet 0   polyethylene glycol (MIRALAX  / GLYCOLAX ) 17 g packet Take 17 g by mouth daily as needed for mild constipation. 14 each 0   traZODone  (DESYREL ) 150 MG tablet Take 1 tablet (150 mg total) by mouth at bedtime as needed for sleep. 30 tablet 0   vortioxetine  HBr (TRINTELLIX ) 10 MG TABS tablet Take 10 mg by mouth daily.      Allergies as of 12/09/2024   (No Known Allergies)    Family History  Problem Relation Age of Onset   Anxiety disorder Mother    Interstitial cystitis Mother    COPD Mother    Heart disease  Mother    Alcohol abuse Father    COPD Father    Seizures Father    Heart disease Father    Anxiety disorder Maternal Aunt    Anxiety disorder Maternal Grandmother    Diabetes Maternal Grandmother    Heart disease Maternal Grandfather    Diabetes Paternal Grandmother    Alcohol abuse Paternal Grandfather    Heart disease Paternal Grandfather    Autism Son    ADD / ADHD Son    Colon cancer Neg Hx    Esophageal cancer Neg Hx    Rectal cancer Neg Hx    Stomach cancer Neg Hx     Social History   Socioeconomic History   Marital status: Significant Other    Spouse name: elliott   Number of children: 1   Years of education: 12   Highest education level: 12th grade  Occupational History   Not on file  Tobacco Use   Smoking status: Every Day    Current packs/day: 1.00    Average packs/day: 0.8 packs/day for 31.4 years (23.9 ttl pk-yrs)    Types: Cigarettes    Start date: 07/20/2008   Smokeless tobacco: Former    Types: Snuff    Quit date: 07/21/2023  Vaping Use   Vaping status: Never Used  Substance and Sexual Activity   Alcohol use: Not Currently    Alcohol/week: 28.0 standard drinks of alcohol    Types: 28 Cans of beer per week    Comment: quit Sept 10 2024   Drug use: Not Currently    Types: Cocaine    Comment: last used cocaine 4 months 06/2023 ago, usually one gram used weekly   Sexual activity: Yes  Other Topics Concern   Not on file  Social History Narrative   Right Handed    No Caffeine Use    Social Drivers of Health   Tobacco Use: High Risk (12/09/2024)   Patient History    Smoking Tobacco Use: Every Day    Smokeless Tobacco Use: Former    Passive Exposure: Not on Actuary Strain: High Risk (05/19/2024)   Received from [facility]   Overall Financial Resource Strain (CARDIA)    Difficulty of Paying Living Expenses: Hard  Food Insecurity: No Food Insecurity (11/23/2024)   Epic    Worried About Programme Researcher, Broadcasting/film/video in the Last Year:  Never true    Ran Out of Food in the Last Year: Never true  Transportation Needs: No Transportation Needs (11/23/2024)   Epic    Lack of Transportation (Medical): No    Lack of Transportation (Non-Medical): No  Physical Activity: Sufficiently Active (09/17/2024)   Received from [facility]   Exercise Vital Sign    On average, how many days per week do you engage in moderate to strenuous exercise (like a brisk walk)?: 5 days    On average, how many minutes do you engage in exercise at this level?: 30 min  Stress: Stress Concern Present (09/17/2024)   Received from [facility] - Occupational Stress Questionnaire    Do you feel stress - tense, restless, nervous, or anxious, or unable to sleep at night because your mind is troubled all the time - these days?: Very much  Social Connections: Socially Integrated (09/17/2024)   Received from [facility]   Social Network    How would you rate your social network (family, work, friends)?: Good participation with social networks  Intimate Partner Violence: Not At Risk (11/23/2024)   Epic    Fear of Current or Ex-Partner: No    Emotionally Abused: No    Physically Abused: No    Sexually Abused: No  Depression (PHQ2-9): High Risk (07/29/2023)   Depression (PHQ2-9)    PHQ-2 Score: 21  Alcohol Screen: High Risk (07/21/2023)   Alcohol Screen    Last Alcohol Screening Score (AUDIT): 22  Housing: Low Risk (11/23/2024)   Epic    Unable to Pay for Housing in the Last Year: No    Number of Times Moved in the Last Year: 0    Homeless in the Last Year: No  Utilities: At Risk (11/23/2024)   Epic    Threatened with loss of utilities: Yes  Health Literacy: Not on file    Review of Systems: ROS is otherwise negative except as mentioned in HPI.  Physical Exam: Vital signs in last 24 hours: Temp:  [97.9 F (36.6 C)-98.2 F (36.8 C)] 97.9 F (36.6 C) (12/17 1520) Pulse Rate:  [78-83] 78 (12/17 1520) Resp:   [17-18] 18 (12/17 1520) BP: (101-126)/(62-83) 101/62 (12/17 1520) SpO2:  [98 %] 98 % (12/17 1520) Weight:  [92 kg] 92 kg (12/17 1043)   General:  Alert, Well-developed, well-nourished, pleasant and cooperative in NAD Head:  Normocephalic and atraumatic. Eyes:  Sclera clear, no icterus.  Conjunctiva pink. Ears:  Normal auditory acuity. Mouth:  No deformity or lesions.   Lungs:  Clear throughout to auscultation.   No wheezes, crackles, or rhonchi.  Heart:  Regular rate and rhythm; no murmurs, clicks, rubs, or gallops. Abdomen:  Soft, non-distended.  BS present.  Some diffuse TTP.  Lap chole incisions healing well. Msk:  Symmetrical without gross deformities. Pulses:  Normal pulses noted. Extremities:  Without clubbing or edema. Neurologic:  Alert and oriented x 4;  grossly normal neurologically. Skin:  Intact without significant lesions or rashes. Psych:  Alert and cooperative. Normal mood and affect.  Lab Results: Recent Labs    12/09/24 1050  WBC 4.4  HGB 15.5  HCT 43.6  PLT 269   BMET Recent Labs    12/09/24  1050  NA 135  K 4.0  CL 97*  CO2 20*  GLUCOSE 95  BUN 10  CREATININE 1.07  CALCIUM 9.3   LFT Recent Labs    12/09/24 1050  PROT 7.0  ALBUMIN 4.2  AST 748*  ALT 573*  ALKPHOS 140*  BILITOT 3.3*    Studies/Results: CT ABDOMEN PELVIS W CONTRAST Result Date: 12/09/2024 EXAM: CT ABDOMEN AND PELVIS WITH CONTRAST 12/09/2024 12:55:12 PM TECHNIQUE: CT of the abdomen and pelvis was performed with the administration of 100 mL iohexol  (OMNIPAQUE ) 300 MG/ML solution. Multiplanar reformatted images are provided for review. Automated exposure control, iterative reconstruction, and/or weight-based adjustment of the mA/kV was utilized to reduce the radiation dose to as low as reasonably achievable. COMPARISON: 11/23/2024 CLINICAL HISTORY: Abdominal pain, post-op FINDINGS: LOWER CHEST: No acute abnormality. LIVER: Diffuse hepatic steatosis. GALLBLADDER AND BILE DUCTS:  Status post cholecystectomy. No biliary ductal dilatation. SPLEEN: No acute abnormality. PANCREAS: No acute abnormality. ADRENAL GLANDS: No acute abnormality. KIDNEYS, URETERS AND BLADDER: No stones in the kidneys or ureters. No hydronephrosis. No perinephric or periureteral stranding. The urinary bladder is diffusely distended measuring 11.2 x 13.5 x 18.8 cm with a volume of 1421 cc. GI AND BOWEL: Stomach demonstrates no acute abnormality. There is no pathologic dilatation of the bowel loops to suggest obstruction or postop ileus. Intramural fatty deposition is again noted involving the ascending colon. Mild wall thickening of the ascending colon is noted without surrounding inflammatory fat stranding. The remaining portions of the colon are within normal limits. PERITONEUM AND RETROPERITONEUM: No free fluid or fluid collections identified within the abdomen or pelvis. No free air. VASCULATURE: Aorta is normal in caliber. LYMPH NODES: No lymphadenopathy. REPRODUCTIVE ORGANS: No acute abnormality. BONES AND SOFT TISSUES: No acute osseous abnormality. Postoperative changes within the periumbilical ventral abdominal wall, image 45/2. No signs of abdominal wall fluid collection. IMPRESSION: 1. No acute intra-abdominal or pelvic abnormality, with postoperative periumbilical ventral abdominal wall changes and no fluid collection. 2. Marked urinary bladder distention. 3. Intramural fatty deposition and mild wall thickening of the ascending colon without surrounding inflammatory change. Although nonspecific, these findings may be seen in the setting of chronic colitis. Electronically signed by: Waddell Calk MD 12/09/2024 02:04 PM EST RP Workstation: [id number]    IMPRESSION:  33 year old male who is 2 weeks status post cholecystectomy with ongoing complaints of right upper quadrant abdominal pain and nausea and vomiting.  Had an episode of frank hematemesis this morning, prompting his emergency room visit.  Hemoglobin  normal.  Lipase slightly elevated at 93 and LFTs significantly elevated.  CT scan not showing any other biliary or pancreatic abnormalities.  Ultrasound does not show any either but shows fatty liver.  *Elevated LFTs associated with ongoing right upper quadrant abdominal pain and episodes of emesis status post cholecystectomy 2 weeks ago.  Also also been having diarrhea for the past week or so.  Total bili is 3.3, alk phos 140, ALT is 573, AST 748.  Had some mildly elevated LFTs over the past 3 months, which was thought due to gallstones, but extensive liver serologic workup was ordered earlier this month by our practice, not yet drawn.  Ultrasound with fatty liver, no biliary abnormalities.  Most likely scenario would be CBD stone but so far imaging negative for such.  ? Viral/infectious.  *Vomiting/hematemesis: Describes one episode of frank hematemesis this morning.  Hemoglobin normal.  ? Esophagitis or MWT.  *CT scan showing intramural fat deposition and mild wall thickening of the ascending  colon without surrounding inflammatory change, nonspecific, may be seen in the setting of chronic colitis.  Patient having diarrhea x 1 one week, not a chronic problem.  Patient had colonoscopy December 2024 that was unremarkable.  ? Acute infectious colitis.  *Elevated lipase: Slightly elevated at 93.  Could be elevated from vomiting.  No evidence of pancreatitis on CT scan.  PLAN: -Trend labs and will check all previous serologies that were ordered as an outpatient including acute hep panel. -If diarrhea continues then please check stool studies for infectious sources. -Symptomatic management with IVFs, antiemetics. -Ok for clear liquids if he can tolerate some but then NPO after midnight for possible EGD +/- ERCP on 12/18 (pending MRI results). -Would hold off on abx for now since he is afebrile and no leukocytosis (most infectious colitis is self limited). -Pantoprazole  40 mg IV daily. -D/W Dr. Charlanne and  he would like and MRI abdomen/MRCP as well just to be sure no CBD stone.   John Vaughan  12/09/2024, 3:32 PM

## 2024-12-10 ENCOUNTER — Observation Stay (HOSPITAL_COMMUNITY): Payer: MEDICAID

## 2024-12-10 ENCOUNTER — Observation Stay (HOSPITAL_COMMUNITY): Payer: MEDICAID | Admitting: Anesthesiology

## 2024-12-10 ENCOUNTER — Encounter (HOSPITAL_COMMUNITY): Payer: Self-pay | Admitting: Internal Medicine

## 2024-12-10 ENCOUNTER — Encounter (HOSPITAL_COMMUNITY): Admission: EM | Payer: Self-pay | Source: Home / Self Care | Attending: Emergency Medicine

## 2024-12-10 DIAGNOSIS — K2960 Other gastritis without bleeding: Secondary | ICD-10-CM | POA: Diagnosis not present

## 2024-12-10 DIAGNOSIS — R7989 Other specified abnormal findings of blood chemistry: Secondary | ICD-10-CM | POA: Diagnosis not present

## 2024-12-10 DIAGNOSIS — K2970 Gastritis, unspecified, without bleeding: Secondary | ICD-10-CM | POA: Diagnosis not present

## 2024-12-10 DIAGNOSIS — F418 Other specified anxiety disorders: Secondary | ICD-10-CM | POA: Diagnosis not present

## 2024-12-10 DIAGNOSIS — K920 Hematemesis: Secondary | ICD-10-CM | POA: Diagnosis not present

## 2024-12-10 DIAGNOSIS — F17210 Nicotine dependence, cigarettes, uncomplicated: Secondary | ICD-10-CM | POA: Diagnosis not present

## 2024-12-10 DIAGNOSIS — I10 Essential (primary) hypertension: Secondary | ICD-10-CM | POA: Diagnosis not present

## 2024-12-10 HISTORY — PX: ESOPHAGOGASTRODUODENOSCOPY: SHX5428

## 2024-12-10 LAB — COMPREHENSIVE METABOLIC PANEL WITH GFR
ALT: 422 U/L — ABNORMAL HIGH (ref 0–44)
AST: 490 U/L — ABNORMAL HIGH (ref 15–41)
Albumin: 3.6 g/dL (ref 3.5–5.0)
Alkaline Phosphatase: 118 U/L (ref 38–126)
Anion gap: 13 (ref 5–15)
BUN: 9 mg/dL (ref 6–20)
CO2: 22 mmol/L (ref 22–32)
Calcium: 8.8 mg/dL — ABNORMAL LOW (ref 8.9–10.3)
Chloride: 101 mmol/L (ref 98–111)
Creatinine, Ser: 1.17 mg/dL (ref 0.61–1.24)
GFR, Estimated: >60 mL/min (ref >60)
Glucose, Bld: 85 mg/dL (ref 70–99)
Potassium: 3.8 mmol/L (ref 3.5–5.1)
Sodium: 135 mmol/L (ref 135–145)
Total Bilirubin: 5.5 mg/dL — ABNORMAL HIGH (ref 0.0–1.2)
Total Protein: 5.7 g/dL — ABNORMAL LOW (ref 6.5–8.1)

## 2024-12-10 LAB — URINALYSIS, ROUTINE W REFLEX MICROSCOPIC
Glucose, UA: NEGATIVE mg/dL
Hgb urine dipstick: NEGATIVE
Ketones, ur: 5 mg/dL — AB
Leukocytes,Ua: NEGATIVE
Nitrite: NEGATIVE
Protein, ur: NEGATIVE mg/dL
Specific Gravity, Urine: 1.042 — ABNORMAL HIGH (ref 1.005–1.030)
pH: 6 (ref 5.0–8.0)

## 2024-12-10 LAB — LIPID PANEL
Cholesterol: 158 mg/dL (ref 0–200)
HDL: 31 mg/dL — ABNORMAL LOW (ref >40)
LDL Cholesterol: 67 mg/dL (ref 0–99)
Total CHOL/HDL Ratio: 5.2 ratio
Triglycerides: 301 mg/dL — ABNORMAL HIGH (ref <150)
VLDL: 60 mg/dL — ABNORMAL HIGH (ref 0–40)

## 2024-12-10 LAB — CBC
HCT: 38.1 % — ABNORMAL LOW (ref 39.0–52.0)
Hemoglobin: 13.4 g/dL (ref 13.0–17.0)
MCH: 34.4 pg — ABNORMAL HIGH (ref 26.0–34.0)
MCHC: 35.2 g/dL (ref 30.0–36.0)
MCV: 97.7 fL (ref 80.0–100.0)
Platelets: 208 K/uL (ref 150–400)
RBC: 3.9 MIL/uL — ABNORMAL LOW (ref 4.22–5.81)
RDW: 13 % (ref 11.5–15.5)
WBC: 6 K/uL (ref 4.0–10.5)
nRBC: 0 % (ref 0.0–0.2)

## 2024-12-10 LAB — URINE DRUG SCREEN
Amphetamines: NEGATIVE
Barbiturates: NEGATIVE
Benzodiazepines: POSITIVE — AB
Cocaine: NEGATIVE
Fentanyl: NEGATIVE
Methadone Scn, Ur: POSITIVE — AB
Opiates: NEGATIVE
Tetrahydrocannabinol: NEGATIVE

## 2024-12-10 LAB — ALPHA-1-ANTITRYPSIN: A-1 Antitrypsin, Ser: 120 mg/dL (ref 95–164)

## 2024-12-10 LAB — CERULOPLASMIN: Ceruloplasmin: 13.1 mg/dL — ABNORMAL LOW (ref 16.0–31.0)

## 2024-12-10 LAB — PROTIME-INR
INR: 1.1 (ref 0.8–1.2)
Prothrombin Time: 14.6 s (ref 11.4–15.2)

## 2024-12-10 LAB — ACETAMINOPHEN LEVEL: Acetaminophen (Tylenol), Serum: <10 ug/mL — ABNORMAL LOW (ref 10–30)

## 2024-12-10 LAB — IGG: IgG (Immunoglobin G), Serum: 1096 mg/dL (ref 603–1613)

## 2024-12-10 LAB — ANA W/REFLEX IF POSITIVE: Anti Nuclear Antibody (ANA): NEGATIVE

## 2024-12-10 LAB — BILIRUBIN, DIRECT: Bilirubin, Direct: 4.2 mg/dL — ABNORMAL HIGH (ref 0.0–0.2)

## 2024-12-10 SURGERY — EGD (ESOPHAGOGASTRODUODENOSCOPY)
Anesthesia: Monitor Anesthesia Care

## 2024-12-10 MED ORDER — LIDOCAINE HCL (CARDIAC) PF 100 MG/5ML IV SOSY
PREFILLED_SYRINGE | INTRAVENOUS | Status: DC | PRN
  Administered 2024-12-10: 13:00:00 80 mg via INTRAVENOUS

## 2024-12-10 MED ORDER — SODIUM CHLORIDE 0.9 % IV SOLN: INTRAVENOUS | Status: AC

## 2024-12-10 MED ORDER — MIDAZOLAM HCL 5 MG/5ML IJ SOLN
INTRAMUSCULAR | Status: DC | PRN
  Administered 2024-12-10: 13:00:00 2 mg via INTRAVENOUS

## 2024-12-10 MED ORDER — PROPOFOL 10 MG/ML IV BOLUS
INTRAVENOUS | Status: AC
  Filled 2024-12-10: qty 20

## 2024-12-10 MED ORDER — GADOBUTROL 1 MMOL/ML IV SOLN
10.0000 mL | Freq: Once | INTRAVENOUS | Status: AC | PRN
  Administered 2024-12-10: 07:00:00 10 mL via INTRAVENOUS

## 2024-12-10 MED ORDER — PROPOFOL 500 MG/50ML IV EMUL
INTRAVENOUS | Status: DC | PRN
  Administered 2024-12-10: 13:00:00 100 ug/kg/min via INTRAVENOUS
  Administered 2024-12-10: 13:00:00 150 ug/kg/min via INTRAVENOUS

## 2024-12-10 MED ORDER — MIDAZOLAM HCL 2 MG/2ML IJ SOLN
INTRAMUSCULAR | Status: AC
  Filled 2024-12-10: qty 2

## 2024-12-10 MED ORDER — SODIUM CHLORIDE 0.9 % IV BOLUS
500.0000 mL | Freq: Once | INTRAVENOUS | Status: AC
  Administered 2024-12-11: 500 mL via INTRAVENOUS

## 2024-12-10 NOTE — Plan of Care (Signed)
°  Problem: Education: Goal: Knowledge of General Education information will improve Description: Including pain rating scale, medication(s)/side effects and non-pharmacologic comfort measures Outcome: Progressing   Problem: Health Behavior/Discharge Planning: Goal: Ability to manage health-related needs will improve Outcome: Progressing   Problem: Clinical Measurements: Goal: Ability to maintain clinical measurements within normal limits will improve Outcome: Progressing Goal: Will remain free from infection Outcome: Progressing Goal: Diagnostic test results will improve Outcome: Progressing Goal: Respiratory complications will improve Outcome: Progressing Goal: Cardiovascular complication will be avoided Outcome: Progressing   Problem: Coping: Goal: Level of anxiety will decrease Outcome: Progressing   Problem: Nutrition: Goal: Adequate nutrition will be maintained Outcome: Progressing   Problem: Safety: Goal: Ability to remain free from injury will improve Outcome: Progressing   Problem: Pain Managment: Goal: General experience of comfort will improve and/or be controlled Outcome: Progressing   Problem: Skin Integrity: Goal: Risk for impaired skin integrity will decrease Outcome: Progressing

## 2024-12-10 NOTE — Hospital Course (Addendum)
 John Vaughan is a 33 yo male with PMH recent CCY on 12/2 for acute calculous cholecystitis.  Other PMH includes depression/anxiety, GERD, HTN, HLD, schizophrenia, ADHD, sleep apnea, bipolar disorder. Since the surgery he has had lingering mild to moderate pain with decreased oral intake during his recovery.  He has taken minimal Tylenol  and mostly oxycodone  as needed. Due to ongoing pain and N/V, he presented for workup.  He had also reported an episode of hematemesis prior to admission as well. He was afebrile with normal vitals. Chemistry panel was remarkable for significantly elevated AST, ALT, alk phos, and total bili.  CBC normal. CT A/P showed no acute abnormalities, no complications from surgery.  Bladder distention noted. RUQ ultrasound unremarkable and showing nondilated CBD and known cholecystectomy. He was admitted for GI evaluation and further workup.

## 2024-12-10 NOTE — Transfer of Care (Signed)
 Immediate Anesthesia Transfer of Care Note  Patient: John Vaughan  Procedure(s) Performed: Procedures: EGD (ESOPHAGOGASTRODUODENOSCOPY) (N/A)  Patient Location: PACU  Anesthesia Type:MAC  Level of Consciousness:  sedated, patient cooperative and responds to stimulation  Airway & Oxygen Therapy:Patient Spontanous Breathing and Patient connected to face mask oxgen  Post-op Assessment:  Report given to PACU RN and Post -op Vital signs reviewed and stable  Post vital signs:  Reviewed and stable  Last Vitals:  Vitals:   12/10/24 1014 12/10/24 1215  BP: 114/80 118/82  Pulse: (!) 58 62  Resp: 17 12  Temp: 36.7 C   SpO2: 97% 95%    Complications: No apparent anesthesia complications

## 2024-12-10 NOTE — Assessment & Plan Note (Signed)
-   On Abilify , clobazam , Trintellix , gabapentin , Atarax , Trileptal  at home

## 2024-12-10 NOTE — Assessment & Plan Note (Addendum)
-   AST 748, ALT 573, alk phos 140, total bili 3.3 -Prior LFTs at discharge on 11/24/2024: AST 43, ALT 59, alk phos 62, total bili 0.6 -Ferritin 6900, iron 261, saturation 90% -PT 16.7, INR 1.3 -Hepatitis panel negative -Tylenol  level negative -UDS benzos, methadone - wide differential initially on admission including choledocholithiasis vs ischemic hepatitis vs DILI vs AI hepatitis vs HLH - with data returning slowly, there is no choledocho nor dilated CBD on MRCP making retained or passed stone less likely. With persistent pain but no leukocytosis makes inflammatory cholestasis less likely but still could have contributed. With poor intake and N/V does make ischemia or perfusion deficits more worrisome to have contributed as well. Lastly, tylenol  level negative, and he states only took it sparingly, and picture not fully consistent with apap toxicity - favor this being combo of ischemia and inflammatory postop; very acute and post op so seems to make AI less likely  - appreciate GI assistance too; AI labs have been ordered and further workup - repeat LFTs in am - continue IVF

## 2024-12-10 NOTE — Progress Notes (Signed)
°   12/10/24 1021  TOC Brief Assessment  Insurance and Status Reviewed  Patient has primary care physician Yes  Home environment has been reviewed resides in a private residence  Prior level of function: Independent  Prior/Current Home Services No current home services  Social Drivers of Health Review SDOH reviewed no interventions necessary  Readmission risk has been reviewed Yes  Transition of care needs no transition of care needs at this time

## 2024-12-10 NOTE — Op Note (Signed)
 [facility] Patient Name: John Vaughan Procedure Date: 12/10/2024 MRN: [id number] Attending MD: Lynnie Bring , MD, [id number] Date of Birth: 07/18/1991 CSN: [id number] Age: 33 Admit Type: Inpatient Procedure:                Upper GI endoscopy Indications:              H/O Hematemesis Providers:                Lynnie Bring, MD, Willy Hummer, RN, Fairy Marina, Technician Referring MD:              Medicines:                Monitored Anesthesia Care Complications:            No immediate complications. Estimated Blood Loss:     Estimated blood loss: none. Procedure:                Pre-Anesthesia Assessment:                           - Prior to the procedure, a History and Physical                            was performed, and patient medications and                            allergies were reviewed. The patient's tolerance of                            previous anesthesia was also reviewed. The risks                            and benefits of the procedure and the sedation                            options and risks were discussed with the patient.                            All questions were answered, and informed consent                            was obtained. Prior Anticoagulants: The patient has                            taken no anticoagulant or antiplatelet agents. ASA                            Grade Assessment: II - A patient with mild systemic                            disease. After reviewing the risks and benefits,  the patient was deemed in satisfactory condition to                            undergo the procedure.                           After obtaining informed consent, the endoscope was                            passed under direct vision. Throughout the                            procedure, the patient's blood pressure, pulse, and                            oxygen saturations were  monitored continuously. The                            [id number]-H190 ([id number]) Olympus endoscope was introduced                            through the mouth, and advanced to the second part                            of duodenum. The upper GI endoscopy was                            accomplished without difficulty. The patient                            tolerated the procedure well. Scope In: Scope Out: Findings:      The examined esophagus was normal with well defined Z-line..      Localized minimal inflammation characterized by erythema was found in       the gastric antrum. Biopsies were taken with a cold forceps for       histology.      The examined duodenum was normal. Biopsies for histology were taken with       a cold forceps for evaluation of celiac disease. Impression:               - Mild Gastritis. Biopsied.                           - Otherwise Nl EGD. No active bleeding. Moderate Sedation:      Not Applicable - Patient had care per Anesthesia. Recommendation:           - Patient has a contact number available for                            emergencies. The signs and symptoms of potential                            delayed complications were discussed with the  patient. Return to normal activities tomorrow.                            Written discharge instructions were provided to the                            patient.                           - Resume previous diet.                           - Continue present medications.                           - Await pathology results.                           - The findings and recommendations were discussed                            with the patient's family. Procedure Code(s):        --- Professional ---                           [phone], Esophagogastroduodenoscopy, flexible,                            transoral; with biopsy, single or multiple Diagnosis Code(s):        --- Professional ---                            K29.70, Gastritis, unspecified, without bleeding                           K92.0, Hematemesis CPT copyright 2022 [employer]. All rights reserved. The codes documented in this report are preliminary and upon coder review may  be revised to meet current compliance requirements. Lynnie Bring, MD 12/10/2024 1:47:01 PM This report has been signed electronically. Number of Addenda: 0

## 2024-12-10 NOTE — Progress Notes (Addendum)
 [facility] Progress Note  CC:  Hematemesis and elevated LFTs  Subjective:  Feeling ok.  Is hungry.  Did eat last night but felt nauseous and had some pain after eating, did not vomiting/no more hematemesis.  No more diarrhea.   Objective:  Vital signs in last 24 hours: Temp:  [97.5 F (36.4 C)-98.2 F (36.8 C)] 97.5 F (36.4 C) (12/18 0605) Pulse Rate:  [56-83] 58 (12/18 0605) Resp:  [16-18] 17 (12/18 0605) BP: (101-126)/(62-85) 126/85 (12/18 0605) SpO2:  [94 %-99 %] 98 % (12/18 0605) Weight:  [92 kg] 92 kg (12/17 1043)   General:  Alert, well-developed, in NAD Heart:  Regular rate and rhythm; no murmurs Pulm:  CTAB.  No W/R/R. Abdomen:  Soft, non-distended.  BS present.  Some upper abdominal TTP. Extremities:  Without edema. Neurologic:  Alert and oriented x 4;  grossly normal neurologically. Psych:  Alert and cooperative. Normal mood and affect.  Intake/Output from previous day: 12/17 0701 - 12/18 0700 In: 275.5 [P.O.:240; I.V.:35.5] Out: 600 [Urine:600]  Lab Results: Recent Labs    12/09/24 1050 12/10/24 0501  WBC 4.4 6.0  HGB 15.5 13.4  HCT 43.6 38.1*  PLT 269 208   BMET Recent Labs    12/09/24 1050 12/10/24 0501  NA 135 135  K 4.0 3.8  CL 97* 101  CO2 20* 22  GLUCOSE 95 85  BUN 10 9  CREATININE 1.07 1.17  CALCIUM 9.3 8.8*   LFT Recent Labs    12/10/24 0501  PROT 5.7*  ALBUMIN 3.6  AST 490*  ALT 422*  ALKPHOS 118  BILITOT 5.5*   PT/INR Recent Labs    12/09/24 1624  LABPROT 16.7*  INR 1.3*   Hepatitis Panel Recent Labs    12/09/24 1624  HEPBSAG NON REACTIVE  HCVAB NON REACTIVE  HEPAIGM NON REACTIVE  HEPBIGM NON REACTIVE    MR ABDOMEN MRCP W WO CONTAST Result Date: 12/10/2024 EXAM: MRCP WITH AND WITHOUT IV CONTRAST 12/10/2024 07:54:13 AM TECHNIQUE: Multisequence, multiplanar magnetic resonance images of the abdomen with and without intravenous contrast. MRCP sequences were performed. 10 mL gadobutrol   (GADAVIST ) 1 MMOL/ML injection 10 mL GADOBUTROL  1 MMOL/ML IV SOLN. COMPARISON: Right upper quadrant ultrasound 12/09/2024 and CT abdomen and pelvis 12/09/2024. CLINICAL HISTORY: Cholelithiasis; Jaundice. FINDINGS: LIVER: Unremarkable. GALLBLADDER AND BILIARY SYSTEM: Postoperative changes from recent cholecystectomy. Some heterogeneous mixed signal intensity material within the gallbladder fossa which is predominantly T2 hyperintense, is noted, likely reflecting recent cholecystectomy. No signs of abscess or biloma. The common bile duct is normal in caliber. No signs of choledocholithiasis. SPLEEN: The spleen is within normal limits in size and appearance. PANCREAS/PANCREATIC DUCT: The pancreas is normal in size and contour without focal lesion or ductal dilatation. ADRENAL GLANDS: Normal size and morphology bilaterally. No nodule, thickening, or hemorrhage. No periadrenal stranding. KIDNEYS: Unremarkable. LYMPH NODES: No enlarged abdominal lymph nodes. VASCULATURE: Unremarkable. PERITONEUM: No significant free fluid or focal fluid collections identified. ABDOMINAL WALL: Postoperative changes identified within the periumbilical region of the ventral abdominal wall. No hernia. No mass. BOWEL: Grossly unremarkable. No bowel obstruction. BONES: No acute abnormality or worrisome osseous lesion. SOFT TISSUES: Unremarkable. MISCELLANEOUS: Unremarkable. IMPRESSION: 1. No evidence of choledocholithiasis. 2. No bile duct dilatation. 3. Postoperative changes from recent cholecystectomy without abscess or biloma. Electronically signed by: Waddell Calk MD 12/10/2024 08:12 AM EST RP Workstation: [id number]   MR 3D Recon At Scanner Result Date: 12/10/2024 EXAM: MRCP WITH AND WITHOUT IV CONTRAST 12/10/2024 07:54:13 AM TECHNIQUE:  Multisequence, multiplanar magnetic resonance images of the abdomen with and without intravenous contrast. MRCP sequences were performed. 10 mL gadobutrol  (GADAVIST ) 1 MMOL/ML injection 10 mL  GADOBUTROL  1 MMOL/ML IV SOLN. COMPARISON: Right upper quadrant ultrasound 12/09/2024 and CT abdomen and pelvis 12/09/2024. CLINICAL HISTORY: Cholelithiasis; Jaundice. FINDINGS: LIVER: Unremarkable. GALLBLADDER AND BILIARY SYSTEM: Postoperative changes from recent cholecystectomy. Some heterogeneous mixed signal intensity material within the gallbladder fossa which is predominantly T2 hyperintense, is noted, likely reflecting recent cholecystectomy. No signs of abscess or biloma. The common bile duct is normal in caliber. No signs of choledocholithiasis. SPLEEN: The spleen is within normal limits in size and appearance. PANCREAS/PANCREATIC DUCT: The pancreas is normal in size and contour without focal lesion or ductal dilatation. ADRENAL GLANDS: Normal size and morphology bilaterally. No nodule, thickening, or hemorrhage. No periadrenal stranding. KIDNEYS: Unremarkable. LYMPH NODES: No enlarged abdominal lymph nodes. VASCULATURE: Unremarkable. PERITONEUM: No significant free fluid or focal fluid collections identified. ABDOMINAL WALL: Postoperative changes identified within the periumbilical region of the ventral abdominal wall. No hernia. No mass. BOWEL: Grossly unremarkable. No bowel obstruction. BONES: No acute abnormality or worrisome osseous lesion. SOFT TISSUES: Unremarkable. MISCELLANEOUS: Unremarkable. IMPRESSION: 1. No evidence of choledocholithiasis. 2. No bile duct dilatation. 3. Postoperative changes from recent cholecystectomy without abscess or biloma. Electronically signed by: Waddell Calk MD 12/10/2024 08:12 AM EST RP Workstation: [id number]   US  Abdomen Limited RUQ (LIVER/GB) Result Date: 12/09/2024 CLINICAL DATA:  Abdominal pain. EXAM: ULTRASOUND ABDOMEN LIMITED RIGHT UPPER QUADRANT COMPARISON:  Right upper quadrant ultrasound dated 11/23/2024 and CT dated 12/09/2024. FINDINGS: Gallbladder: Cholecystectomy. Common bile duct: Diameter: 2 mm Liver: There is diffuse increased liver echogenicity  most commonly seen in the setting of fatty infiltration. Superimposed inflammation or fibrosis is not excluded. Clinical correlation is recommended. Portal vein is patent on color Doppler imaging with normal direction of blood flow towards the liver. Other: None. IMPRESSION: 1. Fatty liver. 2. Cholecystectomy. Electronically Signed   By: Vanetta Chou M.D.   On: 12/09/2024 16:13   CT ABDOMEN PELVIS W CONTRAST Result Date: 12/09/2024 EXAM: CT ABDOMEN AND PELVIS WITH CONTRAST 12/09/2024 12:55:12 PM TECHNIQUE: CT of the abdomen and pelvis was performed with the administration of 100 mL iohexol  (OMNIPAQUE ) 300 MG/ML solution. Multiplanar reformatted images are provided for review. Automated exposure control, iterative reconstruction, and/or weight-based adjustment of the mA/kV was utilized to reduce the radiation dose to as low as reasonably achievable. COMPARISON: 11/23/2024 CLINICAL HISTORY: Abdominal pain, post-op FINDINGS: LOWER CHEST: No acute abnormality. LIVER: Diffuse hepatic steatosis. GALLBLADDER AND BILE DUCTS: Status post cholecystectomy. No biliary ductal dilatation. SPLEEN: No acute abnormality. PANCREAS: No acute abnormality. ADRENAL GLANDS: No acute abnormality. KIDNEYS, URETERS AND BLADDER: No stones in the kidneys or ureters. No hydronephrosis. No perinephric or periureteral stranding. The urinary bladder is diffusely distended measuring 11.2 x 13.5 x 18.8 cm with a volume of 1421 cc. GI AND BOWEL: Stomach demonstrates no acute abnormality. There is no pathologic dilatation of the bowel loops to suggest obstruction or postop ileus. Intramural fatty deposition is again noted involving the ascending colon. Mild wall thickening of the ascending colon is noted without surrounding inflammatory fat stranding. The remaining portions of the colon are within normal limits. PERITONEUM AND RETROPERITONEUM: No free fluid or fluid collections identified within the abdomen or pelvis. No free air. VASCULATURE:  Aorta is normal in caliber. LYMPH NODES: No lymphadenopathy. REPRODUCTIVE ORGANS: No acute abnormality. BONES AND SOFT TISSUES: No acute osseous abnormality. Postoperative changes within the periumbilical ventral abdominal wall, image  45/2. No signs of abdominal wall fluid collection. IMPRESSION: 1. No acute intra-abdominal or pelvic abnormality, with postoperative periumbilical ventral abdominal wall changes and no fluid collection. 2. Marked urinary bladder distention. 3. Intramural fatty deposition and mild wall thickening of the ascending colon without surrounding inflammatory change. Although nonspecific, these findings may be seen in the setting of chronic colitis. Electronically signed by: Waddell Calk MD 12/09/2024 02:04 PM EST RP Workstation: [id number]   Assessment / Plan: 33 year old male who is 2 weeks status post cholecystectomy with ongoing complaints of right upper quadrant abdominal pain and nausea and vomiting.  Had an episode of frank hematemesis this morning, prompting his emergency room visit.  Hemoglobin normal.  Lipase slightly elevated at 93 and LFTs significantly elevated.  CT scan not showing any other biliary or pancreatic abnormalities.  Ultrasound does not show any either but shows fatty liver.   *Elevated LFTs associated with ongoing right upper quadrant abdominal pain and episodes of emesis status post cholecystectomy 2 weeks ago.  Also also been having diarrhea for the past week or so.  Total bili is 3.3, alk phos 140, ALT is 573, AST 748.  Had some mildly elevated LFTs over the past 3 months, which was thought due to gallstones, but extensive liver serologic workup was ordered earlier this month by our practice, not yet drawn.  Ultrasound with fatty liver, no biliary abnormalities.  Most likely scenario would be CBD stone but so far imaging negative for such.  ? Viral/infectious.  MRCP negative for stone.  ? If he passed a stone vs infectious source.  Ceruloplasmin is low and  iron studies quite high.  Total bili up slightly today but other LFTs trending down.  Sometimes the total bili can lag behind the other LFTs.   *Vomiting/hematemesis: Describes one episode of frank hematemesis this morning.  Hemoglobin remains normal, down slightly since yesterday but could also be somewhat dilutional.  ? Esophagitis or MWT.   *CT scan showing intramural fat deposition and mild wall thickening of the ascending colon without surrounding inflammatory change, nonspecific, may be seen in the setting of chronic colitis.  Patient having diarrhea x 1 one week, not a chronic problem.  Patient had colonoscopy December 2024 that was unremarkable.  ? Acute infectious colitis.   *Elevated lipase: Slightly elevated at 93.  Could be elevated from vomiting.  No evidence of pancreatitis on CT scan.  -EGD today. -Will check 24 hour urine copper . -Will check HFe gene for completeness. -Trend labs/LFTs and follow-up other labs/serologies. -Pantoprazole  40 mg IV daily.    LOS: 0 days   John Vaughan. John Vaughan  12/10/2024, 9:16 AM   Attending physician's note   I have taken history, reviewed the chart and examined the patient. I performed a substantive portion of this encounter, including complete performance of at least one of the key components, in conjunction with the APP. I agree with the Advanced Practitioner's note, impression and recommendations.   MRCP neg for CBD stones.  Highly likely that he has passed the CBD stone.  H/O hematemesis Abn LFTs- ?etiology DILI vs passed stone vs metabolic (elevated iron saturation/low ceruloplasmin). No ETOH or FH. Neg AIAT  Plan: - EGD today - After EGD, can change Protonix  to p.o. 40 daily - Agree with checking HFE gene/24hr urine for copper .  - Follow serologic workup - Trend CBC, LFTs and INR. - Pharmacy consult to review meds - FU GI clinic as outpt.  If still with abnormal LFTs would consider liver bx  as outpt   Anselm Bring, MD [place] [phone]

## 2024-12-10 NOTE — Progress Notes (Signed)
 Pt's urine output is 200 cc since 11 am when 24 hour urine collection was started.  Just did a bladder scan at 2300 and it showed zero urine.   Pt has had 900 cc of water  to drink since 6 pm with encouragement.

## 2024-12-10 NOTE — Anesthesia Preprocedure Evaluation (Signed)
 Anesthesia Evaluation  Patient identified by MRN, date of birth, ID band Patient awake    Reviewed: Allergy & Precautions, NPO status , Patient's Chart, lab work & pertinent test results  Airway Mallampati: II  TM Distance: >3 FB Neck ROM: Full    Dental   Pulmonary sleep apnea , Current Smoker and Patient abstained from smoking.   Pulmonary exam normal        Cardiovascular hypertension, Normal cardiovascular exam     Neuro/Psych Seizures -,  PSYCHIATRIC DISORDERS Anxiety Depression Bipolar Disorder Schizophrenia     GI/Hepatic ,GERD  ,,(+)     substance abuse  alcohol use  Endo/Other  negative endocrine ROS    Renal/GU negative Renal ROS     Musculoskeletal   Abdominal   Peds  Hematology  (+) Blood dyscrasia, anemia   Anesthesia Other Findings   Reproductive/Obstetrics                              Anesthesia Physical Anesthesia Plan  ASA: 2  Anesthesia Plan: MAC   Post-op Pain Management:    Induction:   PONV Risk Score and Plan: 0 and Propofol  infusion  Airway Management Planned: Natural Airway and Nasal Cannula  Additional Equipment:   Intra-op Plan:   Post-operative Plan:   Informed Consent: I have reviewed the patients History and Physical, chart, labs and discussed the procedure including the risks, benefits and alternatives for the proposed anesthesia with the patient or authorized representative who has indicated his/her understanding and acceptance.       Plan Discussed with:   Anesthesia Plan Comments:         Anesthesia Quick Evaluation

## 2024-12-10 NOTE — Progress Notes (Signed)
 Pharmacy received a consult from GI to review the patient's medications given recent increase in LFTs.   Based on my initial overview of medications, nothing catches my attention for increasing LFTs that hasn't already been discontinued. I did proceed to look at each medication individually and found a few cases of Abilify -induced liver injury (PTA medication that has not yet been ordered this admission) and trazodone -induced hepatotoxicity (PTA medication that is currently ordered inpatient). Pantoprazole  may also increase LFTs in ~1-10% of patients.   LFTs improved today. After presenting the following information to GI, it was decided to continue current therapy for now and continue to monitor LFTs. Hepatic function panel ordered for tomorrow AM.     Thank you for allowing pharmacy to participate in this patient's care,  Lacinda Moats, PharmD Clinical Pharmacist  12/18/20253:52 PM

## 2024-12-10 NOTE — Assessment & Plan Note (Signed)
-   Given poor intake and ongoing abdominal pain, may have become mild Mallory-Weiss from the retching -GI planning on EGD today, follow-up results -Hgb currently stable; trend with fluids

## 2024-12-10 NOTE — Anesthesia Postprocedure Evaluation (Signed)
 Anesthesia Post Note  Patient: John Vaughan  Procedure(s) Performed: EGD (ESOPHAGOGASTRODUODENOSCOPY)     Patient location during evaluation: PACU Anesthesia Type: MAC Level of consciousness: awake and alert Pain management: pain level controlled Vital Signs Assessment: post-procedure vital signs reviewed and stable Respiratory status: spontaneous breathing, nonlabored ventilation, respiratory function stable and patient connected to nasal cannula oxygen Cardiovascular status: stable and blood pressure returned to baseline Postop Assessment: no apparent nausea or vomiting Anesthetic complications: no   No notable events documented.  Last Vitals:  Vitals:   12/10/24 1400 12/10/24 1754  BP: 110/78 118/77  Pulse: (!) 57 69  Resp: 13 17  Temp:  36.6 C  SpO2: 98% 98%    Last Pain:  Vitals:   12/10/24 1754  TempSrc: Oral  PainSc:                  Coleston Dirosa E

## 2024-12-10 NOTE — Assessment & Plan Note (Addendum)
-   Seems to have been started on Clobazam  in September 2025.  Last fil 11/03/2024, 30-day supply

## 2024-12-10 NOTE — Assessment & Plan Note (Signed)
-   see biploar tx

## 2024-12-10 NOTE — Progress Notes (Signed)
 Progress Note    John Vaughan   FMW:[id number]  DOB: 09-22-1991  DOA: 12/09/2024     0 PCP: Mavis Redge SAILOR, FNP  Initial CC: N/V, abd pain, hematemesis   Hospital Course: John Vaughan is a 33 yo male with PMH recent CCY on 12/2 for acute calculous cholecystitis.  Other PMH includes depression/anxiety, GERD, HTN, HLD, schizophrenia, ADHD, sleep apnea, bipolar disorder. Since the surgery he has had lingering mild to moderate pain with decreased oral intake during his recovery.  He has taken minimal Tylenol  and mostly oxycodone  as needed. Due to ongoing pain and N/V, he presented for workup.  He had also reported an episode of hematemesis prior to admission as well. He was afebrile with normal vitals. Chemistry panel was remarkable for significantly elevated AST, ALT, alk phos, and total bili.  CBC normal. CT A/P showed no acute abnormalities, no complications from surgery.  Bladder distention noted. RUQ ultrasound unremarkable and showing nondilated CBD and known cholecystectomy. He was admitted for GI evaluation and further workup.  Interval History:  No further hematemesis since admission.  Significant other present bedside as well.  Mostly they endorse he has had lingering abdominal pain since surgery and his intake has been rather low even associated with some dizziness spells. Denied taking much Tylenol  after surgery, mostly some oxycodone  as needed. No recent fevers prior to admission.  Assessment and Plan: * Elevated LFTs - AST 748, ALT 573, alk phos 140, total bili 3.3 -Prior LFTs at discharge on 11/24/2024: AST 43, ALT 59, alk phos 62, total bili 0.6 -Ferritin 6900, iron 261, saturation 90% -PT 16.7, INR 1.3 -Hepatitis panel negative -Tylenol  level negative -UDS benzos, methadone - wide differential initially on admission including choledocholithiasis vs ischemic hepatitis vs DILI vs AI hepatitis vs HLH - with data returning slowly, there is no choledocho nor  dilated CBD on MRCP making retained or passed stone less likely. With persistent pain but no leukocytosis makes inflammatory cholestasis less likely but still could have contributed. With poor intake and N/V does make ischemia or perfusion deficits more worrisome to have contributed as well. Lastly, tylenol  level negative, and he states only took it sparingly, and picture not fully consistent with apap toxicity - favor this being combo of ischemia and inflammatory postop; very acute and post op so seems to make AI less likely  - appreciate GI assistance too; AI labs have been ordered and further workup - repeat LFTs in am - continue IVF  Hematemesis - Given poor intake and ongoing abdominal pain, may have become mild Mallory-Weiss from the retching -GI planning on EGD today, follow-up results -Hgb currently stable; trend with fluids  Bipolar disorder (HCC) - On Abilify , clobazam , Trintellix , gabapentin , Atarax , Trileptal  at home   Benzodiazepine dependence (HCC) - Seems to have been started on Clobazam  in September 2025.  Last fil 11/03/2024, 30-day supply  GAD (generalized anxiety disorder) - see biploar tx    Antimicrobials:   DVT prophylaxis:  enoxaparin  (LOVENOX ) injection 40 mg Start: 12/09/24 2200   Code Status:   Code Status: Full Code  Mobility Assessment (Last 72 Hours)     Mobility Assessment     Row Name 12/10/24 1100 12/09/24 2115 12/09/24 2110 12/09/24 17:37:50     Does the patient have exclusion criteria? No- Perform mobility assessment No- Perform mobility assessment No- Perform mobility assessment No- Perform mobility assessment    What is the highest level of mobility based on the mobility assessment? Level 5 (Ambulates independently) -  Balance while walking independently - Complete Level 5 (Ambulates independently) - Balance while walking independently - Complete Level 5 (Ambulates independently) - Balance while walking independently - Complete Level 5  (Ambulates independently) - Balance while walking independently - Complete       Diet: Diet Orders (From admission, onward)     Start     Ordered   12/10/24 0912  Diet NPO time specified  Diet effective now        12/10/24 0911            Barriers to discharge: None Disposition Plan: Home HH orders placed: N/A Status is: Observation needs inpatient  Objective: Blood pressure 118/82, pulse 62, temperature 98 F (36.7 C), temperature source Oral, resp. rate 12, height 5' 11 (1.803 m), weight 92 kg, SpO2 95%.  Examination:  Physical Exam Constitutional:      General: He is not in acute distress.    Appearance: Normal appearance.  HENT:     Head: Normocephalic and atraumatic.     Mouth/Throat:     Mouth: Mucous membranes are moist.  Eyes:     Extraocular Movements: Extraocular movements intact.  Cardiovascular:     Rate and Rhythm: Normal rate and regular rhythm.  Pulmonary:     Effort: Pulmonary effort is normal. No respiratory distress.     Breath sounds: Normal breath sounds. No wheezing.  Abdominal:     General: Bowel sounds are normal. There is no distension.     Palpations: Abdomen is soft.     Tenderness: There is generalized abdominal tenderness.  Musculoskeletal:        General: Normal range of motion.     Cervical back: Normal range of motion and neck supple.  Skin:    General: Skin is warm and dry.  Neurological:     General: No focal deficit present.     Mental Status: He is alert.  Psychiatric:        Mood and Affect: Mood normal.        Behavior: Behavior normal.      Consultants:  GI  Procedures:  12/10/2024: EGD  Data Reviewed: Results for orders placed or performed during the hospital encounter of 12/09/24 (from the past 24 hours)  Hepatitis panel, acute     Status: None   Collection Time: 12/09/24  4:24 PM  Result Value Ref Range   Hepatitis B Surface Ag NON REACTIVE NON REACTIVE   HCV Ab NON REACTIVE NON REACTIVE   Hep A IgM NON  REACTIVE NON REACTIVE   Hep B C IgM NON REACTIVE NON REACTIVE  Iron and TIBC     Status: Abnormal   Collection Time: 12/09/24  4:24 PM  Result Value Ref Range   Iron 261 (H) 45 - 182 ug/dL   TIBC 709 749 - 549 ug/dL   Saturation Ratios 90 (H) 17.9 - 39.5 %   UIBC 29 ug/dL  Ferritin     Status: Abnormal   Collection Time: 12/09/24  4:24 PM  Result Value Ref Range   Ferritin 6,900 (H) 24 - 336 ng/mL  Protime-INR     Status: Abnormal   Collection Time: 12/09/24  4:24 PM  Result Value Ref Range   Prothrombin Time 16.7 (H) 11.4 - 15.2 seconds   INR 1.3 (H) 0.8 - 1.2  IgG     Status: None   Collection Time: 12/09/24  4:24 PM  Result Value Ref Range   IgG (Immunoglobin G), Serum 1,096 603 -  1,613 mg/dL  ANA w/Reflex if Positive     Status: None   Collection Time: 12/09/24  4:24 PM  Result Value Ref Range   Anti Nuclear Antibody (ANA) Negative Negative  Alpha-1-antitrypsin     Status: None   Collection Time: 12/09/24  4:24 PM  Result Value Ref Range   A-1 Antitrypsin, Ser 120 95 - 164 mg/dL  Ceruloplasmin     Status: Abnormal   Collection Time: 12/09/24  4:24 PM  Result Value Ref Range   Ceruloplasmin 13.1 (L) 16.0 - 31.0 mg/dL  CBC     Status: Abnormal   Collection Time: 12/10/24  5:01 AM  Result Value Ref Range   WBC 6.0 4.0 - 10.5 K/uL   RBC 3.90 (L) 4.22 - 5.81 MIL/uL   Hemoglobin 13.4 13.0 - 17.0 g/dL   HCT 61.8 (L) 60.9 - 47.9 %   MCV 97.7 80.0 - 100.0 fL   MCH 34.4 (H) 26.0 - 34.0 pg   MCHC 35.2 30.0 - 36.0 g/dL   RDW 86.9 88.4 - 84.4 %   Platelets 208 150 - 400 K/uL   nRBC 0.0 0.0 - 0.2 %  Comprehensive metabolic panel     Status: Abnormal   Collection Time: 12/10/24  5:01 AM  Result Value Ref Range   Sodium 135 135 - 145 mmol/L   Potassium 3.8 3.5 - 5.1 mmol/L   Chloride 101 98 - 111 mmol/L   CO2 22 22 - 32 mmol/L   Glucose, Bld 85 70 - 99 mg/dL   BUN 9 6 - 20 mg/dL   Creatinine, Ser 8.82 0.61 - 1.24 mg/dL   Calcium 8.8 (L) 8.9 - 10.3 mg/dL   Total Protein  5.7 (L) 6.5 - 8.1 g/dL   Albumin 3.6 3.5 - 5.0 g/dL   AST 509 (H) 15 - 41 U/L   ALT 422 (H) 0 - 44 U/L   Alkaline Phosphatase 118 38 - 126 U/L   Total Bilirubin 5.5 (H) 0.0 - 1.2 mg/dL   GFR, Estimated >39 >39 mL/min   Anion gap 13 5 - 15  Lipid panel     Status: Abnormal   Collection Time: 12/10/24  5:01 AM  Result Value Ref Range   Cholesterol 158 0 - 200 mg/dL   Triglycerides 698 (H) <150 mg/dL   HDL 31 (L) >59 mg/dL   Total CHOL/HDL Ratio 5.2 RATIO   VLDL 60 (H) 0 - 40 mg/dL   LDL Cholesterol 67 0 - 99 mg/dL  Urinalysis, Routine w reflex microscopic -Urine, Clean Catch     Status: Abnormal   Collection Time: 12/10/24  5:25 AM  Result Value Ref Range   Color, Urine AMBER (A) YELLOW   APPearance CLEAR CLEAR   Specific Gravity, Urine 1.042 (H) 1.005 - 1.030   pH 6.0 5.0 - 8.0   Glucose, UA NEGATIVE NEGATIVE mg/dL   Hgb urine dipstick NEGATIVE NEGATIVE   Bilirubin Urine SMALL (A) NEGATIVE   Ketones, ur 5 (A) NEGATIVE mg/dL   Protein, ur NEGATIVE NEGATIVE mg/dL   Nitrite NEGATIVE NEGATIVE   Leukocytes,Ua NEGATIVE NEGATIVE  Urine Drug Screen     Status: Abnormal   Collection Time: 12/10/24  5:26 AM  Result Value Ref Range   Opiates NEGATIVE NEGATIVE   Cocaine NEGATIVE NEGATIVE   Benzodiazepines POSITIVE (A) NEGATIVE   Amphetamines NEGATIVE NEGATIVE   Tetrahydrocannabinol NEGATIVE NEGATIVE   Barbiturates NEGATIVE NEGATIVE   Methadone Scn, Ur POSITIVE (A) NEGATIVE   Fentanyl  NEGATIVE NEGATIVE  Acetaminophen  level     Status: Abnormal   Collection Time: 12/10/24 10:06 AM  Result Value Ref Range   Acetaminophen  (Tylenol ), Serum <10 (L) 10 - 30 ug/mL    I have reviewed pertinent nursing notes, vitals, labs, and images as necessary. I have ordered labwork to follow up on as indicated.  I have reviewed the last notes from staff over past 24 hours. I have discussed patient's care plan and test results with nursing staff, CM/SW, and other staff as appropriate.  Old records  reviewed in assessment of this patient  Time spent: Greater than 50% of the 55 minute visit was spent in counseling/coordination of care for the patient as laid out in the A&P.   LOS: 0 days   Alm Apo, MD [facility] 12/10/2024, 1:29 PM

## 2024-12-11 ENCOUNTER — Other Ambulatory Visit: Payer: Self-pay

## 2024-12-11 ENCOUNTER — Other Ambulatory Visit (HOSPITAL_COMMUNITY): Payer: Self-pay

## 2024-12-11 DIAGNOSIS — R748 Abnormal levels of other serum enzymes: Secondary | ICD-10-CM

## 2024-12-11 DIAGNOSIS — R7989 Other specified abnormal findings of blood chemistry: Secondary | ICD-10-CM | POA: Diagnosis not present

## 2024-12-11 LAB — CBC WITH DIFFERENTIAL/PLATELET
Abs Immature Granulocytes: 0.02 K/uL (ref 0.00–0.07)
Basophils Absolute: 0.1 K/uL (ref 0.0–0.1)
Basophils Relative: 1 %
Eosinophils Absolute: 0.4 K/uL (ref 0.0–0.5)
Eosinophils Relative: 8 %
HCT: 35.8 % — ABNORMAL LOW (ref 39.0–52.0)
Hemoglobin: 12.7 g/dL — ABNORMAL LOW (ref 13.0–17.0)
Immature Granulocytes: 0 %
Lymphocytes Relative: 25 %
Lymphs Abs: 1.3 K/uL (ref 0.7–4.0)
MCH: 34.9 pg — ABNORMAL HIGH (ref 26.0–34.0)
MCHC: 35.5 g/dL (ref 30.0–36.0)
MCV: 98.4 fL (ref 80.0–100.0)
Monocytes Absolute: 0.6 K/uL (ref 0.1–1.0)
Monocytes Relative: 12 %
Neutro Abs: 2.9 K/uL (ref 1.7–7.7)
Neutrophils Relative %: 54 %
Platelets: 163 K/uL (ref 150–400)
RBC: 3.64 MIL/uL — ABNORMAL LOW (ref 4.22–5.81)
RDW: 13.1 % (ref 11.5–15.5)
WBC: 5.3 K/uL (ref 4.0–10.5)
nRBC: 0 % (ref 0.0–0.2)

## 2024-12-11 LAB — MITOCHONDRIAL ANTIBODIES: Mitochondrial M2 Ab, IgG: <20 U (ref 0.0–20.0)

## 2024-12-11 LAB — BASIC METABOLIC PANEL WITH GFR
Anion gap: 11 (ref 5–15)
BUN: 8 mg/dL (ref 6–20)
CO2: 22 mmol/L (ref 22–32)
Calcium: 8.5 mg/dL — ABNORMAL LOW (ref 8.9–10.3)
Chloride: 101 mmol/L (ref 98–111)
Creatinine, Ser: 1.31 mg/dL — ABNORMAL HIGH (ref 0.61–1.24)
GFR, Estimated: >60 mL/min
Glucose, Bld: 106 mg/dL — ABNORMAL HIGH (ref 70–99)
Potassium: 3.3 mmol/L — ABNORMAL LOW (ref 3.5–5.1)
Sodium: 135 mmol/L (ref 135–145)

## 2024-12-11 LAB — HEPATIC FUNCTION PANEL
ALT: 299 U/L — ABNORMAL HIGH (ref 0–44)
AST: 283 U/L — ABNORMAL HIGH (ref 15–41)
Albumin: 3.3 g/dL — ABNORMAL LOW (ref 3.5–5.0)
Alkaline Phosphatase: 128 U/L — ABNORMAL HIGH (ref 38–126)
Bilirubin, Direct: 2.6 mg/dL — ABNORMAL HIGH (ref 0.0–0.2)
Indirect Bilirubin: 0.8 mg/dL (ref 0.3–0.9)
Total Bilirubin: 3.4 mg/dL — ABNORMAL HIGH (ref 0.0–1.2)
Total Protein: 5.6 g/dL — ABNORMAL LOW (ref 6.5–8.1)

## 2024-12-11 LAB — SURGICAL PATHOLOGY

## 2024-12-11 LAB — ANTI-SMOOTH MUSCLE ANTIBODY, IGG: F-Actin IgG: 4 U (ref 0–19)

## 2024-12-11 LAB — MAGNESIUM: Magnesium: 2.1 mg/dL (ref 1.7–2.4)

## 2024-12-11 MED ORDER — POTASSIUM CHLORIDE CRYS ER 20 MEQ PO TBCR
40.0000 meq | EXTENDED_RELEASE_TABLET | Freq: Once | ORAL | Status: AC
  Administered 2024-12-11: 40 meq via ORAL
  Filled 2024-12-11: qty 2

## 2024-12-11 MED ORDER — OXYCODONE HCL 5 MG PO TABS
5.0000 mg | ORAL_TABLET | Freq: Four times a day (QID) | ORAL | 0 refills | Status: AC | PRN
  Filled 2024-12-11: qty 10, 3d supply, fill #0

## 2024-12-11 MED ORDER — SODIUM CHLORIDE 0.9 % IV SOLN: INTRAVENOUS | Status: DC

## 2024-12-11 MED ORDER — PANTOPRAZOLE SODIUM 40 MG PO TBEC: 40.0000 mg | DELAYED_RELEASE_TABLET | Freq: Every day | ORAL | Status: DC

## 2024-12-11 NOTE — Discharge Summary (Signed)
 " Physician Discharge Summary   John Vaughan FMW:[id number] DOB: 04-May-1991 DOA: 12/09/2024  PCP: Mavis Redge SAILOR, FNP  Admit date: 12/09/2024 Discharge date: 12/11/2024  Admitted From: Home Disposition:  Home  Discharging physician: Alm Apo, MD Barriers to discharge: none  Recommendations at discharge: Follow up with GI for repeat LFTs    Discharge Condition: stable CODE STATUS: Full  Diet recommendation:  Diet Orders (From admission, onward)     Start     Ordered   12/11/24 0000  Diet general        12/11/24 1045   12/10/24 1350  Diet regular Room service appropriate? Yes; Fluid consistency: Thin  Diet effective now       Question Answer Comment  Room service appropriate? Yes   Fluid consistency: Thin      12/10/24 1349            Hospital Course: Mr. Edgington is a 33 yo male with PMH recent CCY on 12/2 for acute calculous cholecystitis.  Other PMH includes depression/anxiety, GERD, HTN, HLD, schizophrenia, ADHD, sleep apnea, bipolar disorder. Since the surgery he has had lingering mild to moderate pain with decreased oral intake during his recovery.  He has taken minimal Tylenol  and mostly oxycodone  as needed. Due to ongoing pain and N/V, he presented for workup.  He had also reported an episode of hematemesis prior to admission as well. He was afebrile with normal vitals. Chemistry panel was remarkable for significantly elevated AST, ALT, alk phos, and total bili.  CBC normal. CT A/P showed no acute abnormalities, no complications from surgery.  Bladder distention noted. RUQ ultrasound unremarkable and showing nondilated CBD and known cholecystectomy. He was admitted for GI evaluation and further workup.  Assessment and Plan: * Elevated LFTs - Initial: AST 748, ALT 573, alk phos 140, total bili 3.3 -Prior LFTs at discharge on 11/24/2024: AST 43, ALT 59, alk phos 62, total bili 0.6 -Ferritin 6900, iron 261, saturation 90% -PT 16.7, INR  1.3 -Hepatitis panel negative -Tylenol  level negative -UDS benzos, methadone - wide differential initially on admission including choledocholithiasis vs ischemic hepatitis vs DILI vs AI hepatitis vs HLH - with data returning slowly, there is no choledocho nor dilated CBD on MRCP making retained or passed stone less likely (but not impossible). With persistent pain but no leukocytosis makes inflammatory cholestasis less likely but still could have contributed. With poor intake and N/V does make ischemia or perfusion deficits more worrisome to have contributed as well. Lastly, tylenol  level negative, and he states only took it sparingly, and picture not fully consistent with apap toxicity - favor this being combo of ischemia and inflammatory postop; very acute and post op so seems to make AI less likely; unable to rule out passed stone PTA given obstructive appearing pattern to some degree - appreciate GI assistance too; AI labs have been ordered and further workup - LFTs show improvement with trending - tolerating diet well - okay for discharge with outpt follow up with GI for repeat labs; potential for biopsy if still elevated or plateau  Hematemesis-resolved as of 12/11/2024 - Given poor intake and ongoing abdominal pain, may have become mild Mallory-Weiss from the retching - Underwent EGD on 12/10/2024 showing mild gastritis otherwise normal and no active bleeding  Bipolar disorder (HCC) - On Abilify , clobazam , Trintellix , gabapentin , Atarax , Trileptal  at home   Benzodiazepine dependence (HCC) - Seems to have been started on Clobazam  in September 2025.  Last fil 11/03/2024, 30-day supply  GAD (generalized  anxiety disorder) - see biploar tx     The patient's acute and chronic medical conditions were treated accordingly. On day of discharge, patient was felt deemed stable for discharge. Patient/family member advised to call PCP or come back to ER if needed.   Principal Diagnosis:  Elevated LFTs  Discharge Diagnoses: Active Hospital Problems   Diagnosis Date Noted   Elevated LFTs 12/09/2024    Priority: 1.   Bipolar disorder (HCC) 06/09/2023   Benzodiazepine dependence (HCC) 11/17/2019   GAD (generalized anxiety disorder) 03/09/2019    Resolved Hospital Problems   Diagnosis Date Noted Date Resolved   Hematemesis 12/10/2024 12/11/2024    Priority: 2.     Discharge Instructions     Diet general   Complete by: As directed    Increase activity slowly   Complete by: As directed       Allergies as of 12/11/2024   No Known Allergies      Medication List     TAKE these medications    ARIPiprazole  5 MG tablet Commonly known as: ABILIFY  Take 1 tablet (5 mg total) by mouth daily.   cloBAZam  10 MG tablet Commonly known as: ONFI  Take 1 tablet (10 mg total) by mouth at bedtime.   esomeprazole  40 MG capsule Commonly known as: NexIUM  Take 1 capsule (40 mg total) by mouth 2 (two) times daily before a meal.   gabapentin  600 MG tablet Commonly known as: NEURONTIN  Take 1 tablet (600 mg total) by mouth 2 (two) times daily. What changed: when to take this   hydrOXYzine  50 MG tablet Commonly known as: ATARAX  Take 50 mg by mouth daily as needed for anxiety, itching, nausea or vomiting.   methocarbamol  500 MG tablet Commonly known as: ROBAXIN  Take 750 mg by mouth every 6 (six) hours as needed for muscle spasms.   nicotine  21 mg/24hr patch Commonly known as: NICODERM CQ  - dosed in mg/24 hours Place 21 mg onto the skin daily.   ondansetron  8 MG disintegrating tablet Commonly known as: ZOFRAN -ODT Take 8 mg by mouth every 8 (eight) hours as needed for nausea, vomiting or refractory nausea / vomiting (dissolve orally).   oxcarbazepine  600 MG tablet Commonly known as: TRILEPTAL  Take 1.5 tablets (900 mg total) by mouth 2 (two) times daily.   oxyCODONE  5 MG immediate release tablet Commonly known as: Oxy IR/ROXICODONE  Take 1 tablet (5 mg total) by  mouth every 6 (six) hours as needed for severe pain (pain score 7-10) (not releived by tylenol  and robaxin ).   polyethylene glycol 17 g packet Commonly known as: MIRALAX  / GLYCOLAX  Take 17 g by mouth daily as needed for mild constipation.   traZODone  150 MG tablet Commonly known as: DESYREL  Take 1 tablet (150 mg total) by mouth at bedtime as needed for sleep. What changed: when to take this   vortioxetine  HBr 10 MG Tabs tablet Commonly known as: TRINTELLIX  Take 10 mg by mouth at bedtime.        Allergies[1]  Consultations: GI  Procedures: 12/10/24: EGD  Discharge Exam: BP 110/75 (BP Location: Left Arm)   Pulse 64   Temp (!) 97.4 F (36.3 C) (Oral)   Resp 15   Ht 5' 11 (1.803 m)   Wt 92 kg   SpO2 96%   BMI 28.29 kg/m  Physical Exam Constitutional:      General: He is not in acute distress.    Appearance: Normal appearance.  HENT:     Head: Normocephalic and atraumatic.  Mouth/Throat:     Mouth: Mucous membranes are moist.  Eyes:     Extraocular Movements: Extraocular movements intact.  Cardiovascular:     Rate and Rhythm: Normal rate and regular rhythm.  Pulmonary:     Effort: Pulmonary effort is normal. No respiratory distress.     Breath sounds: Normal breath sounds. No wheezing.  Abdominal:     General: Bowel sounds are normal. There is no distension.     Palpations: Abdomen is soft.     Tenderness: There is abdominal tenderness (minimal/generalized and improved).  Musculoskeletal:        General: Normal range of motion.     Cervical back: Normal range of motion and neck supple.  Skin:    General: Skin is warm and dry.  Neurological:     General: No focal deficit present.     Mental Status: He is alert.  Psychiatric:        Mood and Affect: Mood normal.        Behavior: Behavior normal.      The results of significant diagnostics from this hospitalization (including imaging, microbiology, ancillary and laboratory) are listed below for  reference.   Microbiology: No results found for this or any previous visit (from the past 240 hours).   Labs: BNP (last 3 results) No results for input(s): BNP in the last 8760 hours. Basic Metabolic Panel: Recent Labs  Lab 12/09/24 1050 12/10/24 0501 12/11/24 0508  NA 135 135 135  K 4.0 3.8 3.3*  CL 97* 101 101  CO2 20* 22 22  GLUCOSE 95 85 106*  BUN 10 9 8   CREATININE 1.07 1.17 1.31*  CALCIUM 9.3 8.8* 8.5*  MG  --   --  2.1   Liver Function Tests: Recent Labs  Lab 12/09/24 1050 12/10/24 0501 12/11/24 0508  AST 748* 490* 283*  ALT 573* 422* 299*  ALKPHOS 140* 118 128*  BILITOT 3.3* 5.5* 3.4*  PROT 7.0 5.7* 5.6*  ALBUMIN 4.2 3.6 3.3*   Recent Labs  Lab 12/09/24 1050  LIPASE 93*   No results for input(s): AMMONIA in the last 168 hours. CBC: Recent Labs  Lab 12/09/24 1050 12/10/24 0501 12/11/24 0508  WBC 4.4 6.0 5.3  NEUTROABS 2.6  --  2.9  HGB 15.5 13.4 12.7*  HCT 43.6 38.1* 35.8*  MCV 95.4 97.7 98.4  PLT 269 208 163   Cardiac Enzymes: No results for input(s): CKTOTAL, CKMB, CKMBINDEX, TROPONINI in the last 168 hours. BNP: Invalid input(s): POCBNP CBG: No results for input(s): GLUCAP in the last 168 hours. D-Dimer No results for input(s): DDIMER in the last 72 hours. Hgb A1c No results for input(s): HGBA1C in the last 72 hours. Lipid Profile Recent Labs    12/10/24 0501  CHOL 158  HDL 31*  LDLCALC 67  TRIG 698*  CHOLHDL 5.2   Thyroid  function studies No results for input(s): TSH, T4TOTAL, T3FREE, THYROIDAB in the last 72 hours.  Invalid input(s): FREET3 Anemia work up Recent Labs    12/09/24 1624  FERRITIN 6,900*  TIBC 290  IRON 261*   Urinalysis    Component Value Date/Time   COLORURINE AMBER (A) 12/10/2024 0525   APPEARANCEUR CLEAR 12/10/2024 0525   LABSPEC 1.042 (H) 12/10/2024 0525   PHURINE 6.0 12/10/2024 0525   GLUCOSEU NEGATIVE 12/10/2024 0525   HGBUR NEGATIVE 12/10/2024 0525    BILIRUBINUR SMALL (A) 12/10/2024 0525   KETONESUR 5 (A) 12/10/2024 0525   PROTEINUR NEGATIVE 12/10/2024 0525   NITRITE  NEGATIVE 12/10/2024 0525   LEUKOCYTESUR NEGATIVE 12/10/2024 0525   Sepsis Labs Recent Labs  Lab 12/09/24 1050 12/10/24 0501 12/11/24 0508  WBC 4.4 6.0 5.3   Microbiology No results found for this or any previous visit (from the past 240 hours).  Procedures/Studies: MR ABDOMEN MRCP W WO CONTAST Result Date: 12/10/2024 EXAM: MRCP WITH AND WITHOUT IV CONTRAST 12/10/2024 07:54:13 AM TECHNIQUE: Multisequence, multiplanar magnetic resonance images of the abdomen with and without intravenous contrast. MRCP sequences were performed. 10 mL gadobutrol  (GADAVIST ) 1 MMOL/ML injection 10 mL GADOBUTROL  1 MMOL/ML IV SOLN. COMPARISON: Right upper quadrant ultrasound 12/09/2024 and CT abdomen and pelvis 12/09/2024. CLINICAL HISTORY: Cholelithiasis; Jaundice. FINDINGS: LIVER: Unremarkable. GALLBLADDER AND BILIARY SYSTEM: Postoperative changes from recent cholecystectomy. Some heterogeneous mixed signal intensity material within the gallbladder fossa which is predominantly T2 hyperintense, is noted, likely reflecting recent cholecystectomy. No signs of abscess or biloma. The common bile duct is normal in caliber. No signs of choledocholithiasis. SPLEEN: The spleen is within normal limits in size and appearance. PANCREAS/PANCREATIC DUCT: The pancreas is normal in size and contour without focal lesion or ductal dilatation. ADRENAL GLANDS: Normal size and morphology bilaterally. No nodule, thickening, or hemorrhage. No periadrenal stranding. KIDNEYS: Unremarkable. LYMPH NODES: No enlarged abdominal lymph nodes. VASCULATURE: Unremarkable. PERITONEUM: No significant free fluid or focal fluid collections identified. ABDOMINAL WALL: Postoperative changes identified within the periumbilical region of the ventral abdominal wall. No hernia. No mass. BOWEL: Grossly unremarkable. No bowel obstruction. BONES:  No acute abnormality or worrisome osseous lesion. SOFT TISSUES: Unremarkable. MISCELLANEOUS: Unremarkable. IMPRESSION: 1. No evidence of choledocholithiasis. 2. No bile duct dilatation. 3. Postoperative changes from recent cholecystectomy without abscess or biloma. Electronically signed by: Waddell Calk MD 12/10/2024 08:12 AM EST RP Workstation: [id number]   MR 3D Recon At Scanner Result Date: 12/10/2024 EXAM: MRCP WITH AND WITHOUT IV CONTRAST 12/10/2024 07:54:13 AM TECHNIQUE: Multisequence, multiplanar magnetic resonance images of the abdomen with and without intravenous contrast. MRCP sequences were performed. 10 mL gadobutrol  (GADAVIST ) 1 MMOL/ML injection 10 mL GADOBUTROL  1 MMOL/ML IV SOLN. COMPARISON: Right upper quadrant ultrasound 12/09/2024 and CT abdomen and pelvis 12/09/2024. CLINICAL HISTORY: Cholelithiasis; Jaundice. FINDINGS: LIVER: Unremarkable. GALLBLADDER AND BILIARY SYSTEM: Postoperative changes from recent cholecystectomy. Some heterogeneous mixed signal intensity material within the gallbladder fossa which is predominantly T2 hyperintense, is noted, likely reflecting recent cholecystectomy. No signs of abscess or biloma. The common bile duct is normal in caliber. No signs of choledocholithiasis. SPLEEN: The spleen is within normal limits in size and appearance. PANCREAS/PANCREATIC DUCT: The pancreas is normal in size and contour without focal lesion or ductal dilatation. ADRENAL GLANDS: Normal size and morphology bilaterally. No nodule, thickening, or hemorrhage. No periadrenal stranding. KIDNEYS: Unremarkable. LYMPH NODES: No enlarged abdominal lymph nodes. VASCULATURE: Unremarkable. PERITONEUM: No significant free fluid or focal fluid collections identified. ABDOMINAL WALL: Postoperative changes identified within the periumbilical region of the ventral abdominal wall. No hernia. No mass. BOWEL: Grossly unremarkable. No bowel obstruction. BONES: No acute abnormality or worrisome osseous  lesion. SOFT TISSUES: Unremarkable. MISCELLANEOUS: Unremarkable. IMPRESSION: 1. No evidence of choledocholithiasis. 2. No bile duct dilatation. 3. Postoperative changes from recent cholecystectomy without abscess or biloma. Electronically signed by: Waddell Calk MD 12/10/2024 08:12 AM EST RP Workstation: [id number]   US  Abdomen Limited RUQ (LIVER/GB) Result Date: 12/09/2024 CLINICAL DATA:  Abdominal pain. EXAM: ULTRASOUND ABDOMEN LIMITED RIGHT UPPER QUADRANT COMPARISON:  Right upper quadrant ultrasound dated 11/23/2024 and CT dated 12/09/2024. FINDINGS: Gallbladder: Cholecystectomy. Common bile duct: Diameter: 2 mm Liver: There is  diffuse increased liver echogenicity most commonly seen in the setting of fatty infiltration. Superimposed inflammation or fibrosis is not excluded. Clinical correlation is recommended. Portal vein is patent on color Doppler imaging with normal direction of blood flow towards the liver. Other: None. IMPRESSION: 1. Fatty liver. 2. Cholecystectomy. Electronically Signed   By: Vanetta Chou M.D.   On: 12/09/2024 16:13   CT ABDOMEN PELVIS W CONTRAST Result Date: 12/09/2024 EXAM: CT ABDOMEN AND PELVIS WITH CONTRAST 12/09/2024 12:55:12 PM TECHNIQUE: CT of the abdomen and pelvis was performed with the administration of 100 mL iohexol  (OMNIPAQUE ) 300 MG/ML solution. Multiplanar reformatted images are provided for review. Automated exposure control, iterative reconstruction, and/or weight-based adjustment of the mA/kV was utilized to reduce the radiation dose to as low as reasonably achievable. COMPARISON: 11/23/2024 CLINICAL HISTORY: Abdominal pain, post-op FINDINGS: LOWER CHEST: No acute abnormality. LIVER: Diffuse hepatic steatosis. GALLBLADDER AND BILE DUCTS: Status post cholecystectomy. No biliary ductal dilatation. SPLEEN: No acute abnormality. PANCREAS: No acute abnormality. ADRENAL GLANDS: No acute abnormality. KIDNEYS, URETERS AND BLADDER: No stones in the kidneys or ureters.  No hydronephrosis. No perinephric or periureteral stranding. The urinary bladder is diffusely distended measuring 11.2 x 13.5 x 18.8 cm with a volume of 1421 cc. GI AND BOWEL: Stomach demonstrates no acute abnormality. There is no pathologic dilatation of the bowel loops to suggest obstruction or postop ileus. Intramural fatty deposition is again noted involving the ascending colon. Mild wall thickening of the ascending colon is noted without surrounding inflammatory fat stranding. The remaining portions of the colon are within normal limits. PERITONEUM AND RETROPERITONEUM: No free fluid or fluid collections identified within the abdomen or pelvis. No free air. VASCULATURE: Aorta is normal in caliber. LYMPH NODES: No lymphadenopathy. REPRODUCTIVE ORGANS: No acute abnormality. BONES AND SOFT TISSUES: No acute osseous abnormality. Postoperative changes within the periumbilical ventral abdominal wall, image 45/2. No signs of abdominal wall fluid collection. IMPRESSION: 1. No acute intra-abdominal or pelvic abnormality, with postoperative periumbilical ventral abdominal wall changes and no fluid collection. 2. Marked urinary bladder distention. 3. Intramural fatty deposition and mild wall thickening of the ascending colon without surrounding inflammatory change. Although nonspecific, these findings may be seen in the setting of chronic colitis. Electronically signed by: Waddell Calk MD 12/09/2024 02:04 PM EST RP Workstation: [id number]   CT ABDOMEN PELVIS W CONTRAST Result Date: 11/23/2024 CLINICAL DATA:  Abdominal pain, acute, nonlocalized EXAM: CT ABDOMEN AND PELVIS WITH CONTRAST TECHNIQUE: Multidetector CT imaging of the abdomen and pelvis was performed using the standard protocol following bolus administration of intravenous contrast. RADIATION DOSE REDUCTION: This exam was performed according to the departmental dose-optimization program which includes automated exposure control, adjustment of the mA and/or kV  according to patient size and/or use of iterative reconstruction technique. CONTRAST:  OMNIPAQUE  IOHEXOL  300 MG/ML  SOLN COMPARISON:  None available. FINDINGS: Lower chest: No focal airspace consolidation or pleural effusion. Hepatobiliary: No mass.A couple of punctate radiopaque gallstones. No wall thickening. No intrahepatic or extrahepatic biliary ductal dilation. Pancreas: No mass or main ductal dilation. No peripancreatic inflammation or fluid collection. Spleen: Normal size. No mass. Adrenals/Urinary Tract: No adrenal masses. No renal mass. No nephrolithiasis or hydronephrosis. The urinary bladder is distended without focal abnormality. Stomach/Bowel: The stomach is decompressed without focal abnormality. No small bowel wall thickening or inflammation. No small bowel obstruction.Normal appendix. Vascular/Lymphatic: No aortic aneurysm. No intraabdominal or pelvic lymphadenopathy. Reproductive: No prostatomegaly.No free pelvic fluid. Other: No pneumoperitoneum, ascites, or mesenteric inflammation. Musculoskeletal: No acute fracture or  destructive lesion. IMPRESSION: 1. No acute intra-abdominal or pelvic abnormality. 2. A couple of punctate radiopaque gallstones present. No changes of acute cholecystitis. Electronically Signed   By: Rogelia Myers M.D.   On: 11/23/2024 16:01   US  Abdomen Limited RUQ (LIVER/GB) Result Date: 11/23/2024 CLINICAL DATA:  [id number] RUQ pain [id number] EXAM: ULTRASOUND ABDOMEN LIMITED RIGHT UPPER QUADRANT COMPARISON:  June 24, 2023 FINDINGS: Gallbladder: Small shadowing gallstones. No wall thickening or pericholecystic fluid. No sonographic Murphy's sign noted by sonographer. Common bile duct: Diameter: 2.7 mm Liver: Normal echogenicity. No focal lesion identified. No intrahepatic biliary ductal dilation. Portal vein is patent on color Doppler imaging with normal direction of blood flow towards the liver. Right Kidney: Partially visualized. No mass. No hydronephrosis or  nephrolithiasis. Other: None. IMPRESSION: Cholecystolithiasis. No changes of acute cholecystitis. Electronically Signed   By: Rogelia Myers M.D.   On: 11/23/2024 15:42     Time coordinating discharge: Over 30 minutes    Alm Apo, MD  [facility] 12/11/2024, 12:46 PM    [1] No Known Allergies  "

## 2024-12-11 NOTE — Progress Notes (Signed)
 Discharge Medications delivered from TOC meds to bed [facility] outpatient pharmacy by this RN.

## 2024-12-11 NOTE — Plan of Care (Signed)
   Problem: Nutrition: Goal: Adequate nutrition will be maintained Outcome: Progressing

## 2024-12-11 NOTE — Progress Notes (Signed)
 Patient discharged home, IV removed, discharge paperwork provided and explained, patient verbalized understanding.

## 2024-12-11 NOTE — Progress Notes (Addendum)
 "    [facility] Progress Note  CC:  Hematemesis and elevated LFTs   Subjective:  Feeling better.  Still with some pain and nausea that comes and goes but eating well/tolerating diet.  Objective:  Vital signs in last 24 hours: Temp:  [97.4 F (36.3 C)-98 F (36.7 C)] 97.4 F (36.3 C) (12/19 0553) Pulse Rate:  [57-69] 64 (12/19 0553) Resp:  [12-17] 15 (12/19 0553) BP: (97-118)/(67-84) 110/75 (12/19 0553) SpO2:  [95 %-99 %] 96 % (12/19 0553) Last BM Date : 12/09/24 General:  Alert, Well-developed, in NAD Heart:  Regular rate and rhythm; no murmurs Pulm:  CTAB. No W/R/R. Abdomen:  Soft, non-distended.  BS present.  Mild upper abdominal TTP. Extremities:  Without edema. Neurologic:  Alert and oriented x 4;  grossly normal neurologically. Psych:  Alert and cooperative. Normal mood and affect.  Intake/Output from previous day: 12/18 0701 - 12/19 0700 In: 1510.2 [P.O.:1010; IV Piggyback:500.2] Out: 75 [Urine:75] Intake/Output this shift: Total I/O In: 240 [P.O.:240] Out: -   Lab Results: Recent Labs    12/09/24 1050 12/10/24 0501 12/11/24 0508  WBC 4.4 6.0 5.3  HGB 15.5 13.4 12.7*  HCT 43.6 38.1* 35.8*  PLT 269 208 163   BMET Recent Labs    12/09/24 1050 12/10/24 0501 12/11/24 0508  NA 135 135 135  K 4.0 3.8 3.3*  CL 97* 101 101  CO2 20* 22 22  GLUCOSE 95 85 106*  BUN 10 9 8   CREATININE 1.07 1.17 1.31*  CALCIUM 9.3 8.8* 8.5*   LFT Recent Labs    12/11/24 0508  PROT 5.6*  ALBUMIN 3.3*  AST 283*  ALT 299*  ALKPHOS 128*  BILITOT 3.4*  BILIDIR 2.6*  IBILI 0.8   PT/INR Recent Labs    12/09/24 1624 12/10/24 1436  LABPROT 16.7* 14.6  INR 1.3* 1.1   Hepatitis Panel Recent Labs    12/09/24 1624  HEPBSAG NON REACTIVE  HCVAB NON REACTIVE  HEPAIGM NON REACTIVE  HEPBIGM NON REACTIVE    MR ABDOMEN MRCP W WO CONTAST Result Date: 12/10/2024 EXAM: MRCP WITH AND WITHOUT IV CONTRAST 12/10/2024 07:54:13 AM TECHNIQUE: Multisequence,  multiplanar magnetic resonance images of the abdomen with and without intravenous contrast. MRCP sequences were performed. 10 mL gadobutrol  (GADAVIST ) 1 MMOL/ML injection 10 mL GADOBUTROL  1 MMOL/ML IV SOLN. COMPARISON: Right upper quadrant ultrasound 12/09/2024 and CT abdomen and pelvis 12/09/2024. CLINICAL HISTORY: Cholelithiasis; Jaundice. FINDINGS: LIVER: Unremarkable. GALLBLADDER AND BILIARY SYSTEM: Postoperative changes from recent cholecystectomy. Some heterogeneous mixed signal intensity material within the gallbladder fossa which is predominantly T2 hyperintense, is noted, likely reflecting recent cholecystectomy. No signs of abscess or biloma. The common bile duct is normal in caliber. No signs of choledocholithiasis. SPLEEN: The spleen is within normal limits in size and appearance. PANCREAS/PANCREATIC DUCT: The pancreas is normal in size and contour without focal lesion or ductal dilatation. ADRENAL GLANDS: Normal size and morphology bilaterally. No nodule, thickening, or hemorrhage. No periadrenal stranding. KIDNEYS: Unremarkable. LYMPH NODES: No enlarged abdominal lymph nodes. VASCULATURE: Unremarkable. PERITONEUM: No significant free fluid or focal fluid collections identified. ABDOMINAL WALL: Postoperative changes identified within the periumbilical region of the ventral abdominal wall. No hernia. No mass. BOWEL: Grossly unremarkable. No bowel obstruction. BONES: No acute abnormality or worrisome osseous lesion. SOFT TISSUES: Unremarkable. MISCELLANEOUS: Unremarkable. IMPRESSION: 1. No evidence of choledocholithiasis. 2. No bile duct dilatation. 3. Postoperative changes from recent cholecystectomy without abscess or biloma. Electronically signed by: Waddell Calk MD 12/10/2024 08:12 AM EST RP Workstation:  [id number]   MR 3D Recon At Scanner Result Date: 12/10/2024 EXAM: MRCP WITH AND WITHOUT IV CONTRAST 12/10/2024 07:54:13 AM TECHNIQUE: Multisequence, multiplanar magnetic resonance images of the  abdomen with and without intravenous contrast. MRCP sequences were performed. 10 mL gadobutrol  (GADAVIST ) 1 MMOL/ML injection 10 mL GADOBUTROL  1 MMOL/ML IV SOLN. COMPARISON: Right upper quadrant ultrasound 12/09/2024 and CT abdomen and pelvis 12/09/2024. CLINICAL HISTORY: Cholelithiasis; Jaundice. FINDINGS: LIVER: Unremarkable. GALLBLADDER AND BILIARY SYSTEM: Postoperative changes from recent cholecystectomy. Some heterogeneous mixed signal intensity material within the gallbladder fossa which is predominantly T2 hyperintense, is noted, likely reflecting recent cholecystectomy. No signs of abscess or biloma. The common bile duct is normal in caliber. No signs of choledocholithiasis. SPLEEN: The spleen is within normal limits in size and appearance. PANCREAS/PANCREATIC DUCT: The pancreas is normal in size and contour without focal lesion or ductal dilatation. ADRENAL GLANDS: Normal size and morphology bilaterally. No nodule, thickening, or hemorrhage. No periadrenal stranding. KIDNEYS: Unremarkable. LYMPH NODES: No enlarged abdominal lymph nodes. VASCULATURE: Unremarkable. PERITONEUM: No significant free fluid or focal fluid collections identified. ABDOMINAL WALL: Postoperative changes identified within the periumbilical region of the ventral abdominal wall. No hernia. No mass. BOWEL: Grossly unremarkable. No bowel obstruction. BONES: No acute abnormality or worrisome osseous lesion. SOFT TISSUES: Unremarkable. MISCELLANEOUS: Unremarkable. IMPRESSION: 1. No evidence of choledocholithiasis. 2. No bile duct dilatation. 3. Postoperative changes from recent cholecystectomy without abscess or biloma. Electronically signed by: Waddell Calk MD 12/10/2024 08:12 AM EST RP Workstation: [id number]   US  Abdomen Limited RUQ (LIVER/GB) Result Date: 12/09/2024 CLINICAL DATA:  Abdominal pain. EXAM: ULTRASOUND ABDOMEN LIMITED RIGHT UPPER QUADRANT COMPARISON:  Right upper quadrant ultrasound dated 11/23/2024 and CT dated  12/09/2024. FINDINGS: Gallbladder: Cholecystectomy. Common bile duct: Diameter: 2 mm Liver: There is diffuse increased liver echogenicity most commonly seen in the setting of fatty infiltration. Superimposed inflammation or fibrosis is not excluded. Clinical correlation is recommended. Portal vein is patent on color Doppler imaging with normal direction of blood flow towards the liver. Other: None. IMPRESSION: 1. Fatty liver. 2. Cholecystectomy. Electronically Signed   By: Vanetta Chou M.D.   On: 12/09/2024 16:13   CT ABDOMEN PELVIS W CONTRAST Result Date: 12/09/2024 EXAM: CT ABDOMEN AND PELVIS WITH CONTRAST 12/09/2024 12:55:12 PM TECHNIQUE: CT of the abdomen and pelvis was performed with the administration of 100 mL iohexol  (OMNIPAQUE ) 300 MG/ML solution. Multiplanar reformatted images are provided for review. Automated exposure control, iterative reconstruction, and/or weight-based adjustment of the mA/kV was utilized to reduce the radiation dose to as low as reasonably achievable. COMPARISON: 11/23/2024 CLINICAL HISTORY: Abdominal pain, post-op FINDINGS: LOWER CHEST: No acute abnormality. LIVER: Diffuse hepatic steatosis. GALLBLADDER AND BILE DUCTS: Status post cholecystectomy. No biliary ductal dilatation. SPLEEN: No acute abnormality. PANCREAS: No acute abnormality. ADRENAL GLANDS: No acute abnormality. KIDNEYS, URETERS AND BLADDER: No stones in the kidneys or ureters. No hydronephrosis. No perinephric or periureteral stranding. The urinary bladder is diffusely distended measuring 11.2 x 13.5 x 18.8 cm with a volume of 1421 cc. GI AND BOWEL: Stomach demonstrates no acute abnormality. There is no pathologic dilatation of the bowel loops to suggest obstruction or postop ileus. Intramural fatty deposition is again noted involving the ascending colon. Mild wall thickening of the ascending colon is noted without surrounding inflammatory fat stranding. The remaining portions of the colon are within normal  limits. PERITONEUM AND RETROPERITONEUM: No free fluid or fluid collections identified within the abdomen or pelvis. No free air. VASCULATURE: Aorta is normal in caliber. LYMPH NODES: No lymphadenopathy.  REPRODUCTIVE ORGANS: No acute abnormality. BONES AND SOFT TISSUES: No acute osseous abnormality. Postoperative changes within the periumbilical ventral abdominal wall, image 45/2. No signs of abdominal wall fluid collection. IMPRESSION: 1. No acute intra-abdominal or pelvic abnormality, with postoperative periumbilical ventral abdominal wall changes and no fluid collection. 2. Marked urinary bladder distention. 3. Intramural fatty deposition and mild wall thickening of the ascending colon without surrounding inflammatory change. Although nonspecific, these findings may be seen in the setting of chronic colitis. Electronically signed by: Waddell Calk MD 12/09/2024 02:04 PM EST RP Workstation: [id number]   Assessment / Plan: 33 year old male who is 2 weeks status post cholecystectomy with ongoing complaints of right upper quadrant abdominal pain and nausea and vomiting.  Had an episode of frank hematemesis this morning, prompting his emergency room visit.  Hemoglobin normal.  Lipase slightly elevated at 93 and LFTs significantly elevated.  CT scan not showing any other biliary or pancreatic abnormalities.  Ultrasound does not show any either but shows fatty liver.   *Elevated LFTs associated with ongoing right upper quadrant abdominal pain and episodes of emesis status post cholecystectomy 2 weeks ago.  Also also been having diarrhea for the past week or so.  Total bili is 3.3, alk phos 140, ALT is 573, AST 748.  Had some mildly elevated LFTs over the past 3 months, which was thought due to gallstones, but extensive liver serologic workup was ordered earlier this month by our practice, not yet drawn.  Ultrasound with fatty liver, no biliary abnormalities.  Most likely scenario would be CBD stone but so far  imaging negative for such.  ? Viral/infectious.   MRCP negative for stone.  ? If he passed a stone vs infectious source.  Ceruloplasmin is low and iron studies quite high.  LFTs trending down for the most part.   *Vomiting/hematemesis: Describes one episode of frank hematemesis this morning.  Hemoglobin down slightly since yesterday but could also be somewhat dilutional.  EGD 12/18 with only mild gastritis.   *CT scan showing intramural fat deposition and mild wall thickening of the ascending colon without surrounding inflammatory change, nonspecific, may be seen in the setting of chronic colitis.  Patient having diarrhea x 1 one week, not a chronic problem.  Patient had colonoscopy December 2024 that was unremarkable.  ? Acute infectious colitis.   *Elevated lipase: Slightly elevated at 93.  Could be elevated from vomiting.  No evidence of pancreatitis on CT scan.   -Follow-up from results of gastric biopsies. -Will check 24 hour urine copper . -Will check HFe gene for completeness. -Trend labs/LFTs and follow-up other labs/serologies. -Pantoprazole  40 mg IV daily.  **Patient ok for discharge later this afternoon after completing his 24 hour urine collection.     LOS: 0 days   Harlene BIRCH. Zehr  12/11/2024, 9:34 AM     Attending physician's note   I have taken history, reviewed the chart and examined the patient. I performed a substantive portion of this encounter, including complete performance of at least one of the key components, in conjunction with the APP. I agree with the Advanced Practitioner's note, impression and recommendations.   LFTs trending down well. Liver workup in progress [facility] GI clinic as outpt.  If LFTs are still elevated, will consider liver biopsy.  Patient tolerating p.o. well. Ok from GI standpoint to go home Will sign off for now.   Anselm Bring, MD [facility] [phone]     "

## 2024-12-13 ENCOUNTER — Encounter (HOSPITAL_COMMUNITY): Payer: Self-pay | Admitting: Gastroenterology

## 2024-12-15 LAB — HEMOCHROMATOSIS DNA-PCR(C282Y,H63D)

## 2025-01-12 ENCOUNTER — Ambulatory Visit: Payer: Self-pay | Admitting: Gastroenterology

## 2025-01-18 ENCOUNTER — Ambulatory Visit: Payer: MEDICAID | Admitting: Gastroenterology

## 2025-03-04 ENCOUNTER — Ambulatory Visit: Payer: MEDICAID | Admitting: Neurology
